# Patient Record
Sex: Female | Born: 1950
Health system: Southern US, Community
[De-identification: ages and names within clinical notes are randomized; demographics above are authoritative.]

## PROBLEM LIST (undated history)

## (undated) DIAGNOSIS — E669 Obesity, unspecified: Secondary | ICD-10-CM

## (undated) DIAGNOSIS — F1010 Alcohol abuse, uncomplicated: Secondary | ICD-10-CM

## (undated) DIAGNOSIS — D259 Leiomyoma of uterus, unspecified: Secondary | ICD-10-CM

## (undated) DIAGNOSIS — G894 Chronic pain syndrome: Secondary | ICD-10-CM

## (undated) DIAGNOSIS — E119 Type 2 diabetes mellitus without complications: Secondary | ICD-10-CM

## (undated) DIAGNOSIS — F410 Panic disorder [episodic paroxysmal anxiety] without agoraphobia: Secondary | ICD-10-CM

## (undated) DIAGNOSIS — I639 Cerebral infarction, unspecified: Secondary | ICD-10-CM

## (undated) DIAGNOSIS — H269 Unspecified cataract: Secondary | ICD-10-CM

## (undated) DIAGNOSIS — R195 Other fecal abnormalities: Secondary | ICD-10-CM

## (undated) DIAGNOSIS — IMO0002 Reserved for concepts with insufficient information to code with codable children: Secondary | ICD-10-CM

## (undated) DIAGNOSIS — I10 Essential (primary) hypertension: Secondary | ICD-10-CM

## (undated) DIAGNOSIS — E785 Hyperlipidemia, unspecified: Secondary | ICD-10-CM

## (undated) DIAGNOSIS — F10239 Alcohol dependence with withdrawal, unspecified: Secondary | ICD-10-CM

## (undated) DIAGNOSIS — J36 Peritonsillar abscess: Secondary | ICD-10-CM

## (undated) DIAGNOSIS — G4700 Insomnia, unspecified: Secondary | ICD-10-CM

## (undated) DIAGNOSIS — N493 Fournier gangrene: Secondary | ICD-10-CM

## (undated) DIAGNOSIS — F10939 Alcohol use, unspecified with withdrawal, unspecified: Secondary | ICD-10-CM

## (undated) HISTORY — DX: Peritonsillar abscess: J36

## (undated) HISTORY — DX: Obesity, unspecified: E66.9

## (undated) HISTORY — PX: MOUTH SURGERY: SHX715

## (undated) HISTORY — DX: Alcohol dependence with withdrawal, unspecified: F10.239

## (undated) HISTORY — DX: Type 2 diabetes mellitus without complications: E11.9

## (undated) HISTORY — DX: Leiomyoma of uterus, unspecified: D25.9

## (undated) HISTORY — DX: Cerebral infarction, unspecified: I63.9

## (undated) HISTORY — DX: Reserved for concepts with insufficient information to code with codable children: IMO0002

## (undated) HISTORY — DX: Panic disorder (episodic paroxysmal anxiety): F41.0

## (undated) HISTORY — DX: Other fecal abnormalities: R19.5

## (undated) HISTORY — DX: Fournier gangrene: N49.3

## (undated) HISTORY — DX: Alcohol use, unspecified with withdrawal, unspecified: F10.939

## (undated) HISTORY — DX: Chronic pain syndrome: G89.4

## (undated) HISTORY — DX: Insomnia, unspecified: G47.00

## (undated) HISTORY — DX: Unspecified cataract: H26.9

## (undated) HISTORY — DX: Alcohol abuse, uncomplicated: F10.10

## (undated) HISTORY — DX: Hyperlipidemia, unspecified: E78.5

## (undated) HISTORY — DX: Essential (primary) hypertension: I10

---

## 1993-02-12 DIAGNOSIS — E11319 Type 2 diabetes mellitus with unspecified diabetic retinopathy without macular edema: Secondary | ICD-10-CM | POA: Insufficient documentation

## 1994-02-12 DIAGNOSIS — R195 Other fecal abnormalities: Secondary | ICD-10-CM

## 1994-02-12 HISTORY — DX: Other fecal abnormalities: R19.5

## 1997-06-04 ENCOUNTER — Encounter: Admission: RE | Admit: 1997-06-04 | Discharge: 1997-06-04 | Payer: Self-pay | Admitting: Obstetrics & Gynecology

## 1997-06-04 ENCOUNTER — Other Ambulatory Visit: Admission: RE | Admit: 1997-06-04 | Discharge: 1997-06-04 | Payer: Self-pay | Admitting: Obstetrics & Gynecology

## 1997-07-23 ENCOUNTER — Encounter: Admission: RE | Admit: 1997-07-23 | Discharge: 1997-07-23 | Payer: Self-pay | Admitting: Internal Medicine

## 1997-09-10 ENCOUNTER — Encounter: Admission: RE | Admit: 1997-09-10 | Discharge: 1997-09-10 | Payer: Self-pay | Admitting: Internal Medicine

## 1997-09-28 ENCOUNTER — Encounter: Admission: RE | Admit: 1997-09-28 | Discharge: 1997-09-28 | Payer: Self-pay | Admitting: Internal Medicine

## 1997-10-11 ENCOUNTER — Encounter: Admission: RE | Admit: 1997-10-11 | Discharge: 1998-01-09 | Payer: Self-pay | Admitting: *Deleted

## 1998-03-18 ENCOUNTER — Encounter: Admission: RE | Admit: 1998-03-18 | Discharge: 1998-03-18 | Payer: Self-pay | Admitting: Internal Medicine

## 1998-04-18 ENCOUNTER — Encounter: Admission: RE | Admit: 1998-04-18 | Discharge: 1998-04-18 | Payer: Self-pay | Admitting: Internal Medicine

## 1998-05-12 ENCOUNTER — Encounter: Admission: RE | Admit: 1998-05-12 | Discharge: 1998-05-12 | Payer: Self-pay | Admitting: Internal Medicine

## 1998-10-25 ENCOUNTER — Encounter: Admission: RE | Admit: 1998-10-25 | Discharge: 1998-10-25 | Payer: Self-pay | Admitting: Internal Medicine

## 1999-03-14 ENCOUNTER — Encounter: Admission: RE | Admit: 1999-03-14 | Discharge: 1999-03-14 | Payer: Self-pay | Admitting: Internal Medicine

## 1999-03-20 ENCOUNTER — Ambulatory Visit (HOSPITAL_COMMUNITY): Admission: RE | Admit: 1999-03-20 | Discharge: 1999-03-20 | Payer: Self-pay | Admitting: *Deleted

## 1999-06-08 ENCOUNTER — Encounter: Admission: RE | Admit: 1999-06-08 | Discharge: 1999-06-08 | Payer: Self-pay | Admitting: Internal Medicine

## 1999-07-18 ENCOUNTER — Encounter: Admission: RE | Admit: 1999-07-18 | Discharge: 1999-07-18 | Payer: Self-pay | Admitting: Internal Medicine

## 2000-04-04 ENCOUNTER — Encounter: Admission: RE | Admit: 2000-04-04 | Discharge: 2000-04-04 | Payer: Self-pay | Admitting: Internal Medicine

## 2000-08-29 ENCOUNTER — Encounter: Admission: RE | Admit: 2000-08-29 | Discharge: 2000-08-29 | Payer: Self-pay | Admitting: Internal Medicine

## 2000-09-03 ENCOUNTER — Encounter: Admission: RE | Admit: 2000-09-03 | Discharge: 2000-09-03 | Payer: Self-pay | Admitting: Internal Medicine

## 2000-09-05 ENCOUNTER — Encounter: Admission: RE | Admit: 2000-09-05 | Discharge: 2000-09-05 | Payer: Self-pay | Admitting: Internal Medicine

## 2000-09-26 ENCOUNTER — Ambulatory Visit (HOSPITAL_COMMUNITY): Admission: RE | Admit: 2000-09-26 | Discharge: 2000-09-26 | Payer: Self-pay | Admitting: Internal Medicine

## 2000-10-03 ENCOUNTER — Encounter: Admission: RE | Admit: 2000-10-03 | Discharge: 2000-10-03 | Payer: Self-pay | Admitting: Internal Medicine

## 2001-05-08 ENCOUNTER — Encounter: Admission: RE | Admit: 2001-05-08 | Discharge: 2001-05-08 | Payer: Self-pay | Admitting: Internal Medicine

## 2001-05-22 ENCOUNTER — Encounter (HOSPITAL_BASED_OUTPATIENT_CLINIC_OR_DEPARTMENT_OTHER): Admission: RE | Admit: 2001-05-22 | Discharge: 2001-08-20 | Payer: Self-pay | Admitting: Internal Medicine

## 2001-08-27 ENCOUNTER — Encounter: Admission: RE | Admit: 2001-08-27 | Discharge: 2001-08-27 | Payer: Self-pay | Admitting: Internal Medicine

## 2001-09-09 ENCOUNTER — Encounter: Admission: RE | Admit: 2001-09-09 | Discharge: 2001-09-09 | Payer: Self-pay | Admitting: Internal Medicine

## 2001-09-29 ENCOUNTER — Encounter (HOSPITAL_BASED_OUTPATIENT_CLINIC_OR_DEPARTMENT_OTHER): Admission: RE | Admit: 2001-09-29 | Discharge: 2001-12-28 | Payer: Self-pay | Admitting: Internal Medicine

## 2001-10-30 ENCOUNTER — Encounter (INDEPENDENT_AMBULATORY_CARE_PROVIDER_SITE_OTHER): Payer: Self-pay | Admitting: Internal Medicine

## 2001-11-07 ENCOUNTER — Ambulatory Visit (HOSPITAL_COMMUNITY): Admission: RE | Admit: 2001-11-07 | Discharge: 2001-11-07 | Payer: Self-pay | Admitting: *Deleted

## 2002-01-12 ENCOUNTER — Encounter (HOSPITAL_BASED_OUTPATIENT_CLINIC_OR_DEPARTMENT_OTHER): Admission: RE | Admit: 2002-01-12 | Discharge: 2002-04-12 | Payer: Self-pay | Admitting: Internal Medicine

## 2002-03-19 ENCOUNTER — Encounter: Admission: RE | Admit: 2002-03-19 | Discharge: 2002-06-17 | Payer: Self-pay | Admitting: Internal Medicine

## 2002-06-29 ENCOUNTER — Encounter (HOSPITAL_BASED_OUTPATIENT_CLINIC_OR_DEPARTMENT_OTHER): Admission: RE | Admit: 2002-06-29 | Discharge: 2002-09-27 | Payer: Self-pay | Admitting: Internal Medicine

## 2002-07-17 ENCOUNTER — Encounter: Admission: RE | Admit: 2002-07-17 | Discharge: 2002-07-17 | Payer: Self-pay | Admitting: Internal Medicine

## 2002-07-24 ENCOUNTER — Encounter: Admission: RE | Admit: 2002-07-24 | Discharge: 2002-07-24 | Payer: Self-pay | Admitting: Internal Medicine

## 2002-07-29 ENCOUNTER — Encounter: Admission: RE | Admit: 2002-07-29 | Discharge: 2002-10-27 | Payer: Self-pay | Admitting: Internal Medicine

## 2002-08-19 ENCOUNTER — Encounter: Admission: RE | Admit: 2002-08-19 | Discharge: 2002-08-19 | Payer: Self-pay | Admitting: Internal Medicine

## 2002-09-07 ENCOUNTER — Encounter: Admission: RE | Admit: 2002-09-07 | Discharge: 2002-09-07 | Payer: Self-pay | Admitting: Internal Medicine

## 2002-10-26 ENCOUNTER — Encounter (HOSPITAL_BASED_OUTPATIENT_CLINIC_OR_DEPARTMENT_OTHER): Admission: RE | Admit: 2002-10-26 | Discharge: 2003-01-24 | Payer: Self-pay | Admitting: Internal Medicine

## 2002-10-26 ENCOUNTER — Encounter: Admission: RE | Admit: 2002-10-26 | Discharge: 2002-10-26 | Payer: Self-pay | Admitting: Infectious Diseases

## 2002-11-04 ENCOUNTER — Encounter: Payer: Self-pay | Admitting: Internal Medicine

## 2002-11-04 ENCOUNTER — Ambulatory Visit (HOSPITAL_COMMUNITY): Admission: RE | Admit: 2002-11-04 | Discharge: 2002-11-04 | Payer: Self-pay | Admitting: Internal Medicine

## 2002-11-25 ENCOUNTER — Ambulatory Visit (HOSPITAL_COMMUNITY): Admission: RE | Admit: 2002-11-25 | Discharge: 2002-11-25 | Payer: Self-pay | Admitting: *Deleted

## 2002-11-25 ENCOUNTER — Encounter (INDEPENDENT_AMBULATORY_CARE_PROVIDER_SITE_OTHER): Payer: Self-pay | Admitting: Internal Medicine

## 2002-11-26 ENCOUNTER — Encounter: Admission: RE | Admit: 2002-11-26 | Discharge: 2003-02-24 | Payer: Self-pay | Admitting: Internal Medicine

## 2002-12-08 ENCOUNTER — Encounter: Admission: RE | Admit: 2002-12-08 | Discharge: 2002-12-08 | Payer: Self-pay | Admitting: Internal Medicine

## 2003-03-01 ENCOUNTER — Encounter: Admission: RE | Admit: 2003-03-01 | Discharge: 2003-03-01 | Payer: Self-pay | Admitting: Internal Medicine

## 2003-03-25 ENCOUNTER — Encounter: Admission: RE | Admit: 2003-03-25 | Discharge: 2003-03-25 | Payer: Self-pay | Admitting: Internal Medicine

## 2003-03-27 ENCOUNTER — Ambulatory Visit (HOSPITAL_COMMUNITY): Admission: RE | Admit: 2003-03-27 | Discharge: 2003-03-27 | Payer: Self-pay | Admitting: Internal Medicine

## 2003-04-01 ENCOUNTER — Encounter: Admission: RE | Admit: 2003-04-01 | Discharge: 2003-04-01 | Payer: Self-pay | Admitting: Internal Medicine

## 2003-05-12 ENCOUNTER — Encounter (HOSPITAL_BASED_OUTPATIENT_CLINIC_OR_DEPARTMENT_OTHER): Admission: RE | Admit: 2003-05-12 | Discharge: 2003-05-25 | Payer: Self-pay | Admitting: Internal Medicine

## 2003-08-11 ENCOUNTER — Encounter (HOSPITAL_BASED_OUTPATIENT_CLINIC_OR_DEPARTMENT_OTHER): Admission: RE | Admit: 2003-08-11 | Discharge: 2003-11-09 | Payer: Self-pay | Admitting: Internal Medicine

## 2003-12-22 ENCOUNTER — Emergency Department (HOSPITAL_COMMUNITY): Admission: EM | Admit: 2003-12-22 | Discharge: 2003-12-22 | Payer: Self-pay | Admitting: Emergency Medicine

## 2003-12-24 ENCOUNTER — Emergency Department (HOSPITAL_COMMUNITY): Admission: EM | Admit: 2003-12-24 | Discharge: 2003-12-24 | Payer: Self-pay | Admitting: Emergency Medicine

## 2004-01-11 ENCOUNTER — Encounter (HOSPITAL_BASED_OUTPATIENT_CLINIC_OR_DEPARTMENT_OTHER): Admission: RE | Admit: 2004-01-11 | Discharge: 2004-04-10 | Payer: Self-pay | Admitting: Internal Medicine

## 2004-01-17 ENCOUNTER — Ambulatory Visit: Payer: Self-pay | Admitting: Internal Medicine

## 2004-07-14 ENCOUNTER — Ambulatory Visit: Payer: Self-pay | Admitting: Internal Medicine

## 2004-09-30 ENCOUNTER — Emergency Department (HOSPITAL_COMMUNITY): Admission: EM | Admit: 2004-09-30 | Discharge: 2004-09-30 | Payer: Self-pay | Admitting: Emergency Medicine

## 2005-02-27 ENCOUNTER — Ambulatory Visit: Payer: Self-pay | Admitting: Hospitalist

## 2005-03-06 ENCOUNTER — Ambulatory Visit: Payer: Self-pay | Admitting: Internal Medicine

## 2005-05-06 ENCOUNTER — Emergency Department (HOSPITAL_COMMUNITY): Admission: EM | Admit: 2005-05-06 | Discharge: 2005-05-06 | Payer: Self-pay | Admitting: Emergency Medicine

## 2005-08-02 ENCOUNTER — Ambulatory Visit: Payer: Self-pay | Admitting: Internal Medicine

## 2005-12-26 DIAGNOSIS — E785 Hyperlipidemia, unspecified: Secondary | ICD-10-CM

## 2005-12-26 DIAGNOSIS — I152 Hypertension secondary to endocrine disorders: Secondary | ICD-10-CM | POA: Insufficient documentation

## 2005-12-26 DIAGNOSIS — F1011 Alcohol abuse, in remission: Secondary | ICD-10-CM | POA: Insufficient documentation

## 2005-12-26 DIAGNOSIS — I10 Essential (primary) hypertension: Secondary | ICD-10-CM

## 2005-12-26 DIAGNOSIS — K2980 Duodenitis without bleeding: Secondary | ICD-10-CM | POA: Insufficient documentation

## 2005-12-26 DIAGNOSIS — E1169 Type 2 diabetes mellitus with other specified complication: Secondary | ICD-10-CM | POA: Insufficient documentation

## 2005-12-26 DIAGNOSIS — D259 Leiomyoma of uterus, unspecified: Secondary | ICD-10-CM | POA: Insufficient documentation

## 2005-12-26 DIAGNOSIS — D126 Benign neoplasm of colon, unspecified: Secondary | ICD-10-CM | POA: Insufficient documentation

## 2005-12-26 DIAGNOSIS — E1159 Type 2 diabetes mellitus with other circulatory complications: Secondary | ICD-10-CM | POA: Insufficient documentation

## 2005-12-26 DIAGNOSIS — F10231 Alcohol dependence with withdrawal delirium: Secondary | ICD-10-CM | POA: Insufficient documentation

## 2006-04-09 ENCOUNTER — Encounter (INDEPENDENT_AMBULATORY_CARE_PROVIDER_SITE_OTHER): Payer: Self-pay | Admitting: *Deleted

## 2006-04-09 ENCOUNTER — Telehealth (INDEPENDENT_AMBULATORY_CARE_PROVIDER_SITE_OTHER): Payer: Self-pay | Admitting: *Deleted

## 2006-04-09 ENCOUNTER — Ambulatory Visit: Payer: Self-pay | Admitting: Hospitalist

## 2006-04-09 DIAGNOSIS — H269 Unspecified cataract: Secondary | ICD-10-CM | POA: Insufficient documentation

## 2006-04-09 DIAGNOSIS — E1139 Type 2 diabetes mellitus with other diabetic ophthalmic complication: Secondary | ICD-10-CM | POA: Insufficient documentation

## 2006-04-09 LAB — CONVERTED CEMR LAB
CO2: 27 meq/L (ref 19–32)
Calcium: 9.6 mg/dL (ref 8.4–10.5)
Chloride: 93 meq/L — ABNORMAL LOW (ref 96–112)
Creatinine, Ser: 0.99 mg/dL (ref 0.40–1.20)
Glucose, Bld: 493 mg/dL — ABNORMAL HIGH (ref 70–99)
Potassium: 3.9 meq/L (ref 3.5–5.3)
Total CHOL/HDL Ratio: 5
Total Protein: 7.2 g/dL (ref 6.0–8.3)

## 2006-04-17 ENCOUNTER — Telehealth: Payer: Self-pay | Admitting: *Deleted

## 2006-06-24 ENCOUNTER — Emergency Department (HOSPITAL_COMMUNITY): Admission: EM | Admit: 2006-06-24 | Discharge: 2006-06-24 | Payer: Self-pay | Admitting: Emergency Medicine

## 2006-07-15 ENCOUNTER — Emergency Department (HOSPITAL_COMMUNITY): Admission: EM | Admit: 2006-07-15 | Discharge: 2006-07-15 | Payer: Self-pay | Admitting: Family Medicine

## 2006-10-14 HISTORY — PX: OTHER SURGICAL HISTORY: SHX169

## 2006-10-17 ENCOUNTER — Inpatient Hospital Stay (HOSPITAL_COMMUNITY): Admission: EM | Admit: 2006-10-17 | Discharge: 2006-10-27 | Payer: Self-pay | Admitting: Emergency Medicine

## 2006-10-17 ENCOUNTER — Encounter (INDEPENDENT_AMBULATORY_CARE_PROVIDER_SITE_OTHER): Payer: Self-pay | Admitting: General Surgery

## 2006-10-17 ENCOUNTER — Ambulatory Visit: Payer: Self-pay | Admitting: *Deleted

## 2006-10-19 ENCOUNTER — Encounter (INDEPENDENT_AMBULATORY_CARE_PROVIDER_SITE_OTHER): Payer: Self-pay | Admitting: *Deleted

## 2006-10-21 ENCOUNTER — Encounter (INDEPENDENT_AMBULATORY_CARE_PROVIDER_SITE_OTHER): Payer: Self-pay | Admitting: *Deleted

## 2006-10-21 ENCOUNTER — Ambulatory Visit: Payer: Self-pay | Admitting: Infectious Diseases

## 2006-11-14 ENCOUNTER — Telehealth (INDEPENDENT_AMBULATORY_CARE_PROVIDER_SITE_OTHER): Payer: Self-pay | Admitting: *Deleted

## 2006-11-18 ENCOUNTER — Encounter (INDEPENDENT_AMBULATORY_CARE_PROVIDER_SITE_OTHER): Payer: Self-pay | Admitting: *Deleted

## 2006-11-18 ENCOUNTER — Ambulatory Visit: Payer: Self-pay | Admitting: *Deleted

## 2006-11-18 DIAGNOSIS — M726 Necrotizing fasciitis: Secondary | ICD-10-CM | POA: Insufficient documentation

## 2006-11-18 LAB — CONVERTED CEMR LAB
ALT: 15 units/L (ref 0–35)
Alkaline Phosphatase: 93 units/L (ref 39–117)
BUN: 29 mg/dL — ABNORMAL HIGH (ref 6–23)
Glucose, Bld: 322 mg/dL — ABNORMAL HIGH (ref 70–99)
Total Bilirubin: 0.6 mg/dL (ref 0.3–1.2)
Total Protein: 9 g/dL — ABNORMAL HIGH (ref 6.0–8.3)

## 2006-11-21 ENCOUNTER — Ambulatory Visit: Payer: Self-pay | Admitting: *Deleted

## 2006-11-22 ENCOUNTER — Encounter (INDEPENDENT_AMBULATORY_CARE_PROVIDER_SITE_OTHER): Payer: Self-pay | Admitting: *Deleted

## 2006-11-27 ENCOUNTER — Ambulatory Visit: Payer: Self-pay | Admitting: *Deleted

## 2006-11-27 LAB — CONVERTED CEMR LAB
BUN: 19 mg/dL (ref 6–23)
Blood Glucose, Fingerstick: 458
Hgb A1c MFr Bld: 9.2 %
Sodium: 135 meq/L (ref 135–145)

## 2006-12-02 ENCOUNTER — Telehealth: Payer: Self-pay | Admitting: Infectious Diseases

## 2006-12-06 ENCOUNTER — Telehealth (INDEPENDENT_AMBULATORY_CARE_PROVIDER_SITE_OTHER): Payer: Self-pay | Admitting: *Deleted

## 2006-12-18 ENCOUNTER — Telehealth (INDEPENDENT_AMBULATORY_CARE_PROVIDER_SITE_OTHER): Payer: Self-pay | Admitting: *Deleted

## 2006-12-25 ENCOUNTER — Ambulatory Visit: Payer: Self-pay | Admitting: Internal Medicine

## 2006-12-25 LAB — CONVERTED CEMR LAB: Blood Glucose, Home Monitor: 2 mg/dL

## 2007-01-22 ENCOUNTER — Ambulatory Visit: Payer: Self-pay | Admitting: Internal Medicine

## 2007-01-22 ENCOUNTER — Encounter (INDEPENDENT_AMBULATORY_CARE_PROVIDER_SITE_OTHER): Payer: Self-pay | Admitting: *Deleted

## 2007-01-22 LAB — CONVERTED CEMR LAB: Blood Glucose, Fingerstick: 140

## 2007-01-23 LAB — CONVERTED CEMR LAB
CO2: 22 meq/L (ref 19–32)
Calcium: 9.3 mg/dL (ref 8.4–10.5)
Creatinine, Ser: 0.94 mg/dL (ref 0.40–1.20)
Glucose, Bld: 117 mg/dL — ABNORMAL HIGH (ref 70–99)
Potassium: 4.5 meq/L (ref 3.5–5.3)
Sodium: 141 meq/L (ref 135–145)

## 2007-02-18 ENCOUNTER — Telehealth (INDEPENDENT_AMBULATORY_CARE_PROVIDER_SITE_OTHER): Payer: Self-pay | Admitting: *Deleted

## 2007-02-19 ENCOUNTER — Telehealth: Payer: Self-pay | Admitting: Licensed Clinical Social Worker

## 2007-02-21 ENCOUNTER — Ambulatory Visit: Payer: Self-pay | Admitting: Internal Medicine

## 2007-02-21 ENCOUNTER — Encounter (INDEPENDENT_AMBULATORY_CARE_PROVIDER_SITE_OTHER): Payer: Self-pay | Admitting: *Deleted

## 2007-02-21 ENCOUNTER — Encounter: Payer: Self-pay | Admitting: Licensed Clinical Social Worker

## 2007-02-21 LAB — CONVERTED CEMR LAB
Calcium: 9.5 mg/dL (ref 8.4–10.5)
Glucose, Bld: 70 mg/dL (ref 70–99)
Potassium: 4.4 meq/L (ref 3.5–5.3)

## 2007-02-25 ENCOUNTER — Telehealth (INDEPENDENT_AMBULATORY_CARE_PROVIDER_SITE_OTHER): Payer: Self-pay | Admitting: *Deleted

## 2007-03-25 ENCOUNTER — Ambulatory Visit: Payer: Self-pay | Admitting: Internal Medicine

## 2007-03-25 LAB — CONVERTED CEMR LAB: Blood Glucose, Fingerstick: 65

## 2007-03-27 ENCOUNTER — Telehealth (INDEPENDENT_AMBULATORY_CARE_PROVIDER_SITE_OTHER): Payer: Self-pay | Admitting: *Deleted

## 2007-04-01 ENCOUNTER — Telehealth: Payer: Self-pay | Admitting: Licensed Clinical Social Worker

## 2007-04-02 ENCOUNTER — Telehealth: Payer: Self-pay | Admitting: *Deleted

## 2007-04-10 ENCOUNTER — Telehealth: Payer: Self-pay | Admitting: *Deleted

## 2007-04-24 ENCOUNTER — Ambulatory Visit: Payer: Self-pay | Admitting: *Deleted

## 2007-04-24 DIAGNOSIS — M549 Dorsalgia, unspecified: Secondary | ICD-10-CM | POA: Insufficient documentation

## 2007-06-24 ENCOUNTER — Ambulatory Visit: Payer: Self-pay | Admitting: Internal Medicine

## 2007-06-24 LAB — CONVERTED CEMR LAB: Blood Glucose, Fingerstick: 110

## 2007-07-11 ENCOUNTER — Encounter (INDEPENDENT_AMBULATORY_CARE_PROVIDER_SITE_OTHER): Payer: Self-pay | Admitting: *Deleted

## 2007-09-05 ENCOUNTER — Ambulatory Visit: Payer: Self-pay | Admitting: Internal Medicine

## 2007-09-05 ENCOUNTER — Ambulatory Visit (HOSPITAL_COMMUNITY): Admission: RE | Admit: 2007-09-05 | Discharge: 2007-09-05 | Payer: Self-pay | Admitting: Internal Medicine

## 2007-09-05 ENCOUNTER — Encounter: Payer: Self-pay | Admitting: Internal Medicine

## 2007-09-05 DIAGNOSIS — R0789 Other chest pain: Secondary | ICD-10-CM | POA: Insufficient documentation

## 2007-09-05 LAB — CONVERTED CEMR LAB
Blood Glucose, Fingerstick: 72
Hgb A1c MFr Bld: 6.5 %

## 2007-09-12 ENCOUNTER — Ambulatory Visit: Payer: Self-pay | Admitting: Internal Medicine

## 2007-09-12 ENCOUNTER — Encounter: Payer: Self-pay | Admitting: Internal Medicine

## 2007-09-15 LAB — CONVERTED CEMR LAB
Cholesterol: 210 mg/dL — ABNORMAL HIGH (ref 0–200)
LDL Cholesterol: 136 mg/dL — ABNORMAL HIGH (ref 0–99)
Total CHOL/HDL Ratio: 3.5
Triglycerides: 71 mg/dL (ref <150)
VLDL: 14 mg/dL (ref 0–40)

## 2007-09-17 ENCOUNTER — Encounter: Payer: Self-pay | Admitting: Internal Medicine

## 2007-09-18 ENCOUNTER — Encounter: Payer: Self-pay | Admitting: Internal Medicine

## 2007-10-08 ENCOUNTER — Ambulatory Visit: Payer: Self-pay | Admitting: *Deleted

## 2007-10-08 ENCOUNTER — Encounter: Payer: Self-pay | Admitting: Internal Medicine

## 2007-10-08 LAB — CONVERTED CEMR LAB
AST: 15 units/L (ref 0–37)
Albumin: 3.7 g/dL (ref 3.5–5.2)
Alkaline Phosphatase: 71 units/L (ref 39–117)
Blood Glucose, Fingerstick: 47
Calcium: 9.1 mg/dL (ref 8.4–10.5)

## 2007-10-30 ENCOUNTER — Ambulatory Visit: Payer: Self-pay | Admitting: Cardiology

## 2007-11-03 ENCOUNTER — Ambulatory Visit: Payer: Self-pay

## 2007-12-15 ENCOUNTER — Telehealth: Payer: Self-pay | Admitting: Internal Medicine

## 2007-12-28 ENCOUNTER — Telehealth: Payer: Self-pay | Admitting: Internal Medicine

## 2007-12-31 ENCOUNTER — Encounter: Payer: Self-pay | Admitting: Internal Medicine

## 2008-01-05 ENCOUNTER — Telehealth: Payer: Self-pay | Admitting: Internal Medicine

## 2008-01-13 ENCOUNTER — Encounter: Payer: Self-pay | Admitting: Licensed Clinical Social Worker

## 2008-01-19 ENCOUNTER — Ambulatory Visit: Payer: Self-pay | Admitting: Internal Medicine

## 2008-01-19 ENCOUNTER — Encounter: Payer: Self-pay | Admitting: Internal Medicine

## 2008-01-19 DIAGNOSIS — J019 Acute sinusitis, unspecified: Secondary | ICD-10-CM | POA: Insufficient documentation

## 2008-01-20 LAB — CONVERTED CEMR LAB
ALT: 16 units/L (ref 0–35)
AST: 14 units/L (ref 0–37)
Albumin: 4.4 g/dL (ref 3.5–5.2)
Alkaline Phosphatase: 83 units/L (ref 39–117)
Calcium: 9.2 mg/dL (ref 8.4–10.5)
Chloride: 107 meq/L (ref 96–112)
Creatinine, Ser: 1 mg/dL (ref 0.40–1.20)
Glucose, Bld: 145 mg/dL — ABNORMAL HIGH (ref 70–99)
Potassium: 4.1 meq/L (ref 3.5–5.3)

## 2008-02-03 ENCOUNTER — Ambulatory Visit: Payer: Self-pay | Admitting: Internal Medicine

## 2008-02-13 HISTORY — PX: COLONOSCOPY: SHX174

## 2008-02-19 ENCOUNTER — Ambulatory Visit: Payer: Self-pay | Admitting: Internal Medicine

## 2008-02-19 ENCOUNTER — Encounter (INDEPENDENT_AMBULATORY_CARE_PROVIDER_SITE_OTHER): Payer: Self-pay | Admitting: Internal Medicine

## 2008-02-19 LAB — CONVERTED CEMR LAB: Blood Glucose, Fingerstick: 123

## 2008-02-23 ENCOUNTER — Telehealth: Payer: Self-pay | Admitting: Internal Medicine

## 2008-02-24 ENCOUNTER — Telehealth (INDEPENDENT_AMBULATORY_CARE_PROVIDER_SITE_OTHER): Payer: Self-pay | Admitting: Internal Medicine

## 2008-02-24 LAB — CONVERTED CEMR LAB
BUN: 38 mg/dL — ABNORMAL HIGH (ref 6–23)
CO2: 15 meq/L — ABNORMAL LOW (ref 19–32)
Calcium: 9.4 mg/dL (ref 8.4–10.5)

## 2008-02-25 ENCOUNTER — Ambulatory Visit (HOSPITAL_COMMUNITY): Admission: RE | Admit: 2008-02-25 | Discharge: 2008-02-25 | Payer: Self-pay | Admitting: Internal Medicine

## 2008-02-25 ENCOUNTER — Telehealth: Payer: Self-pay | Admitting: Internal Medicine

## 2008-02-27 ENCOUNTER — Telehealth: Payer: Self-pay | Admitting: *Deleted

## 2008-02-28 ENCOUNTER — Telehealth (INDEPENDENT_AMBULATORY_CARE_PROVIDER_SITE_OTHER): Payer: Self-pay | Admitting: Internal Medicine

## 2008-03-01 ENCOUNTER — Encounter (INDEPENDENT_AMBULATORY_CARE_PROVIDER_SITE_OTHER): Payer: Self-pay | Admitting: Internal Medicine

## 2008-03-01 ENCOUNTER — Telehealth: Payer: Self-pay | Admitting: *Deleted

## 2008-03-01 ENCOUNTER — Ambulatory Visit: Payer: Self-pay | Admitting: Internal Medicine

## 2008-03-01 ENCOUNTER — Encounter: Payer: Self-pay | Admitting: Internal Medicine

## 2008-03-03 LAB — CONVERTED CEMR LAB
BUN: 21 mg/dL (ref 6–23)
CO2: 18 meq/L — ABNORMAL LOW (ref 19–32)
Calcium: 9.1 mg/dL (ref 8.4–10.5)
Chloride: 106 meq/L (ref 96–112)

## 2008-03-09 ENCOUNTER — Ambulatory Visit: Payer: Self-pay | Admitting: Infectious Disease

## 2008-03-09 ENCOUNTER — Encounter (INDEPENDENT_AMBULATORY_CARE_PROVIDER_SITE_OTHER): Payer: Self-pay | Admitting: Internal Medicine

## 2008-03-10 ENCOUNTER — Telehealth (INDEPENDENT_AMBULATORY_CARE_PROVIDER_SITE_OTHER): Payer: Self-pay | Admitting: Internal Medicine

## 2008-03-10 LAB — CONVERTED CEMR LAB
BUN: 33 mg/dL — ABNORMAL HIGH (ref 6–23)
Chloride: 107 meq/L (ref 96–112)
Osmolality: 291 mOsm/kg (ref 275–300)
Potassium: 4.5 meq/L (ref 3.5–5.3)
Sodium: 137 meq/L (ref 135–145)

## 2008-03-12 DIAGNOSIS — Z87448 Personal history of other diseases of urinary system: Secondary | ICD-10-CM | POA: Insufficient documentation

## 2008-03-17 ENCOUNTER — Ambulatory Visit: Payer: Self-pay | Admitting: Gastroenterology

## 2008-03-17 DIAGNOSIS — Z860100 Personal history of colon polyps, unspecified: Secondary | ICD-10-CM | POA: Insufficient documentation

## 2008-03-18 ENCOUNTER — Telehealth: Payer: Self-pay | Admitting: Gastroenterology

## 2008-03-19 ENCOUNTER — Telehealth: Payer: Self-pay | Admitting: Gastroenterology

## 2008-03-23 ENCOUNTER — Ambulatory Visit: Payer: Self-pay | Admitting: Gastroenterology

## 2008-04-08 ENCOUNTER — Encounter: Payer: Self-pay | Admitting: Internal Medicine

## 2008-04-28 ENCOUNTER — Encounter: Payer: Self-pay | Admitting: Internal Medicine

## 2008-05-13 ENCOUNTER — Ambulatory Visit: Payer: Self-pay | Admitting: Internal Medicine

## 2008-05-13 ENCOUNTER — Encounter: Payer: Self-pay | Admitting: Internal Medicine

## 2008-05-13 LAB — CONVERTED CEMR LAB
Hgb A1c MFr Bld: 8 %
LDL Cholesterol: 152 mg/dL — ABNORMAL HIGH (ref 0–99)
VLDL: 23 mg/dL (ref 0–40)

## 2008-05-18 ENCOUNTER — Encounter: Payer: Self-pay | Admitting: Internal Medicine

## 2008-05-18 ENCOUNTER — Ambulatory Visit: Payer: Self-pay | Admitting: Internal Medicine

## 2008-08-23 ENCOUNTER — Telehealth (INDEPENDENT_AMBULATORY_CARE_PROVIDER_SITE_OTHER): Payer: Self-pay | Admitting: *Deleted

## 2008-08-23 ENCOUNTER — Ambulatory Visit: Payer: Self-pay | Admitting: Internal Medicine

## 2008-08-23 LAB — CONVERTED CEMR LAB: Blood Glucose, Fingerstick: 173

## 2008-08-25 ENCOUNTER — Ambulatory Visit: Payer: Self-pay | Admitting: Internal Medicine

## 2008-08-25 ENCOUNTER — Encounter: Payer: Self-pay | Admitting: Internal Medicine

## 2008-08-30 ENCOUNTER — Encounter: Payer: Self-pay | Admitting: Internal Medicine

## 2008-09-16 LAB — CONVERTED CEMR LAB
Albumin: 4.1 g/dL (ref 3.5–5.2)
Basophils Absolute: 0 10*3/uL (ref 0.0–0.1)
CO2: 20 meq/L (ref 19–32)
Chloride: 107 meq/L (ref 96–112)
Cholesterol: 261 mg/dL — ABNORMAL HIGH (ref 0–200)
HDL: 65 mg/dL (ref >39)
Hemoglobin: 11.9 g/dL — ABNORMAL LOW (ref 12.0–15.0)
Lymphocytes Relative: 37 % (ref 12–46)
Lymphs Abs: 2.6 10*3/uL (ref 0.7–4.0)
MCV: 92.2 fL (ref 78.0–100.0)
Monocytes Absolute: 0.4 10*3/uL (ref 0.1–1.0)
Monocytes Relative: 6 % (ref 3–12)
Neutro Abs: 3.7 10*3/uL (ref 1.7–7.7)
Platelets: 224 10*3/uL (ref 150–400)
RBC: 4.11 M/uL (ref 3.87–5.11)
RDW: 14.4 % (ref 11.5–15.5)
Sodium: 139 meq/L (ref 135–145)
Total Bilirubin: 0.3 mg/dL (ref 0.3–1.2)
Total CHOL/HDL Ratio: 4
Triglycerides: 198 mg/dL — ABNORMAL HIGH (ref <150)
VLDL: 40 mg/dL (ref 0–40)
WBC: 7 10*3/uL (ref 4.0–10.5)

## 2008-11-11 ENCOUNTER — Ambulatory Visit: Payer: Self-pay | Admitting: Internal Medicine

## 2008-11-11 DIAGNOSIS — M79609 Pain in unspecified limb: Secondary | ICD-10-CM | POA: Insufficient documentation

## 2008-11-25 ENCOUNTER — Encounter: Payer: Self-pay | Admitting: Internal Medicine

## 2008-11-25 ENCOUNTER — Ambulatory Visit: Payer: Self-pay | Admitting: Internal Medicine

## 2008-11-25 ENCOUNTER — Telehealth: Payer: Self-pay | Admitting: *Deleted

## 2008-12-02 ENCOUNTER — Telehealth: Payer: Self-pay | Admitting: Internal Medicine

## 2008-12-17 ENCOUNTER — Telehealth: Payer: Self-pay | Admitting: Internal Medicine

## 2009-01-04 ENCOUNTER — Encounter: Payer: Self-pay | Admitting: Internal Medicine

## 2009-01-11 ENCOUNTER — Encounter: Payer: Self-pay | Admitting: Licensed Clinical Social Worker

## 2009-01-12 ENCOUNTER — Ambulatory Visit: Payer: Self-pay | Admitting: Infectious Disease

## 2009-01-12 DIAGNOSIS — J209 Acute bronchitis, unspecified: Secondary | ICD-10-CM | POA: Insufficient documentation

## 2009-01-12 LAB — CONVERTED CEMR LAB
Blood Glucose, Fingerstick: 68
Hgb A1c MFr Bld: 7.6 %

## 2009-03-02 ENCOUNTER — Ambulatory Visit (HOSPITAL_COMMUNITY): Admission: RE | Admit: 2009-03-02 | Discharge: 2009-03-02 | Payer: Self-pay | Admitting: Internal Medicine

## 2009-03-11 ENCOUNTER — Telehealth: Payer: Self-pay | Admitting: Internal Medicine

## 2009-04-28 ENCOUNTER — Ambulatory Visit: Payer: Self-pay | Admitting: Internal Medicine

## 2009-04-28 DIAGNOSIS — R197 Diarrhea, unspecified: Secondary | ICD-10-CM | POA: Insufficient documentation

## 2009-04-28 DIAGNOSIS — G4700 Insomnia, unspecified: Secondary | ICD-10-CM | POA: Insufficient documentation

## 2009-04-29 LAB — CONVERTED CEMR LAB
ALT: 12 units/L (ref 0–35)
AST: 10 units/L (ref 0–37)
Albumin: 4.2 g/dL (ref 3.5–5.2)
Alkaline Phosphatase: 69 units/L (ref 39–117)
Cholesterol: 242 mg/dL — ABNORMAL HIGH (ref 0–200)
LDL Cholesterol: 149 mg/dL — ABNORMAL HIGH (ref 0–99)
Potassium: 4.2 meq/L (ref 3.5–5.3)
Total CHOL/HDL Ratio: 3.4
Triglycerides: 112 mg/dL (ref <150)

## 2009-05-30 ENCOUNTER — Ambulatory Visit: Payer: Self-pay | Admitting: Internal Medicine

## 2009-05-31 ENCOUNTER — Telehealth: Payer: Self-pay | Admitting: Internal Medicine

## 2009-06-07 ENCOUNTER — Encounter: Payer: Self-pay | Admitting: Internal Medicine

## 2009-06-08 ENCOUNTER — Telehealth: Payer: Self-pay | Admitting: Licensed Clinical Social Worker

## 2009-06-09 ENCOUNTER — Encounter: Payer: Self-pay | Admitting: Internal Medicine

## 2009-06-17 ENCOUNTER — Telehealth: Payer: Self-pay | Admitting: Internal Medicine

## 2009-06-29 ENCOUNTER — Ambulatory Visit: Payer: Self-pay | Admitting: Infectious Disease

## 2009-06-29 LAB — CONVERTED CEMR LAB: Pap Smear: NEGATIVE

## 2009-06-29 LAB — HM PAP SMEAR: HM Pap smear: NEGATIVE

## 2009-07-04 ENCOUNTER — Ambulatory Visit: Payer: Self-pay | Admitting: Internal Medicine

## 2009-07-12 ENCOUNTER — Telehealth: Payer: Self-pay | Admitting: Internal Medicine

## 2009-07-26 ENCOUNTER — Telehealth (INDEPENDENT_AMBULATORY_CARE_PROVIDER_SITE_OTHER): Payer: Self-pay | Admitting: *Deleted

## 2009-08-17 ENCOUNTER — Telehealth: Payer: Self-pay | Admitting: Licensed Clinical Social Worker

## 2009-08-23 ENCOUNTER — Telehealth (INDEPENDENT_AMBULATORY_CARE_PROVIDER_SITE_OTHER): Payer: Self-pay | Admitting: *Deleted

## 2009-08-23 ENCOUNTER — Encounter: Payer: Self-pay | Admitting: Internal Medicine

## 2009-09-26 ENCOUNTER — Ambulatory Visit: Payer: Self-pay | Admitting: Internal Medicine

## 2009-09-26 DIAGNOSIS — G894 Chronic pain syndrome: Secondary | ICD-10-CM | POA: Insufficient documentation

## 2009-09-26 LAB — CONVERTED CEMR LAB: Blood Glucose, Fingerstick: 134

## 2009-09-28 ENCOUNTER — Telehealth: Payer: Self-pay | Admitting: *Deleted

## 2009-09-28 DIAGNOSIS — J309 Allergic rhinitis, unspecified: Secondary | ICD-10-CM | POA: Insufficient documentation

## 2009-10-05 ENCOUNTER — Telehealth: Payer: Self-pay | Admitting: *Deleted

## 2009-10-11 ENCOUNTER — Telehealth: Payer: Self-pay | Admitting: Internal Medicine

## 2009-10-12 ENCOUNTER — Encounter: Payer: Self-pay | Admitting: Internal Medicine

## 2009-10-25 ENCOUNTER — Encounter: Payer: Self-pay | Admitting: Internal Medicine

## 2009-10-25 ENCOUNTER — Telehealth: Payer: Self-pay | Admitting: *Deleted

## 2009-11-21 ENCOUNTER — Telehealth (INDEPENDENT_AMBULATORY_CARE_PROVIDER_SITE_OTHER): Payer: Self-pay | Admitting: *Deleted

## 2009-11-21 ENCOUNTER — Encounter: Payer: Self-pay | Admitting: Internal Medicine

## 2009-12-14 ENCOUNTER — Telehealth: Payer: Self-pay | Admitting: Internal Medicine

## 2009-12-19 ENCOUNTER — Encounter: Payer: Self-pay | Admitting: Licensed Clinical Social Worker

## 2009-12-19 ENCOUNTER — Encounter: Payer: Self-pay | Admitting: Internal Medicine

## 2009-12-22 ENCOUNTER — Encounter: Payer: Self-pay | Admitting: Internal Medicine

## 2009-12-22 ENCOUNTER — Telehealth: Payer: Self-pay | Admitting: Internal Medicine

## 2009-12-22 ENCOUNTER — Telehealth (INDEPENDENT_AMBULATORY_CARE_PROVIDER_SITE_OTHER): Payer: Self-pay | Admitting: *Deleted

## 2010-01-23 ENCOUNTER — Telehealth (INDEPENDENT_AMBULATORY_CARE_PROVIDER_SITE_OTHER): Payer: Self-pay | Admitting: *Deleted

## 2010-01-23 ENCOUNTER — Encounter: Payer: Self-pay | Admitting: Internal Medicine

## 2010-01-31 ENCOUNTER — Telehealth: Payer: Self-pay | Admitting: Licensed Clinical Social Worker

## 2010-02-02 ENCOUNTER — Ambulatory Visit: Payer: Self-pay | Admitting: Internal Medicine

## 2010-02-02 ENCOUNTER — Encounter: Payer: Self-pay | Admitting: Internal Medicine

## 2010-02-02 DIAGNOSIS — R5381 Other malaise: Secondary | ICD-10-CM | POA: Insufficient documentation

## 2010-02-02 DIAGNOSIS — R5383 Other fatigue: Secondary | ICD-10-CM

## 2010-02-02 LAB — CONVERTED CEMR LAB
ALT: 12 units/L (ref 0–35)
Albumin: 4.2 g/dL (ref 3.5–5.2)
CO2: 22 meq/L (ref 19–32)
Calcium: 9.4 mg/dL (ref 8.4–10.5)
Chloride: 104 meq/L (ref 96–112)
Potassium: 4.8 meq/L (ref 3.5–5.3)
RBC: 4.07 M/uL (ref 3.87–5.11)
Sodium: 140 meq/L (ref 135–145)
TSH: 1.939 microintl units/mL (ref 0.350–4.50)
Total CHOL/HDL Ratio: 3.8
Total CK: 74 units/L (ref 7–177)
Total Protein: 7.5 g/dL (ref 6.0–8.3)
VLDL: 22 mg/dL (ref 0–40)
WBC: 6.5 10*3/uL (ref 4.0–10.5)

## 2010-02-13 ENCOUNTER — Telehealth: Payer: Self-pay | Admitting: Internal Medicine

## 2010-02-15 ENCOUNTER — Telehealth: Payer: Self-pay | Admitting: Internal Medicine

## 2010-02-20 ENCOUNTER — Telehealth: Payer: Self-pay | Admitting: *Deleted

## 2010-02-20 ENCOUNTER — Encounter: Payer: Self-pay | Admitting: Internal Medicine

## 2010-02-23 ENCOUNTER — Telehealth: Payer: Self-pay | Admitting: Internal Medicine

## 2010-03-01 ENCOUNTER — Encounter
Admission: RE | Admit: 2010-03-01 | Discharge: 2010-03-14 | Payer: Self-pay | Source: Home / Self Care | Attending: Internal Medicine | Admitting: Internal Medicine

## 2010-03-03 ENCOUNTER — Ambulatory Visit (HOSPITAL_COMMUNITY): Admission: RE | Admit: 2010-03-03 | Payer: Self-pay | Source: Home / Self Care | Admitting: Family Medicine

## 2010-03-07 ENCOUNTER — Ambulatory Visit: Admission: RE | Admit: 2010-03-07 | Discharge: 2010-03-07 | Payer: Self-pay | Source: Home / Self Care

## 2010-03-07 ENCOUNTER — Encounter: Payer: Self-pay | Admitting: Licensed Clinical Social Worker

## 2010-03-07 DIAGNOSIS — F4321 Adjustment disorder with depressed mood: Secondary | ICD-10-CM | POA: Insufficient documentation

## 2010-03-09 ENCOUNTER — Telehealth: Payer: Self-pay | Admitting: Licensed Clinical Social Worker

## 2010-03-14 NOTE — Progress Notes (Signed)
Summary: metformin/ hla  Phone Note Call from Patient   Summary of Call: pt called to say she was taking her metformin the way dr Shon Baton had ask and would call back next week to let dr Shon Baton know how they were doing, i ask if there were any problems and she said no, i just need to let my body adjust Initial call taken by: Freddy Finner RN,  September 28, 2009 8:46 AM
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
## 2010-03-14 NOTE — Progress Notes (Signed)
Summary: refill/ hla  Phone Note Refill Request Message from:  Patient on October 25, 2009 10:20 AM  Refills Requested: Medication #1:  PERCOCET 5-325 MG  TABS Take 1 tablet by mouth every 6 hours as needed for pain   Dosage confirmed as above?Dosage Confirmed   Last Refilled: 8/15 pt would like to pick up script thurs am, she would like to pick up between 9am and 10am. she is having laser eye surg friday and will not be able to get out for awhile. last visit 09/26/2009  Initial call taken by: Freddy Finner RN,  October 25, 2009 10:22 AM  Follow-up for Phone Call        Rx printed and signed - nurse to complete. Follow-up by: Bertha Stakes MD,  October 25, 2009 11:16 AM  Additional Follow-up for Phone Call Additional follow up Details #1::        Rx given to patient Additional Follow-up by: Sander Nephew RN,  October 27, 2009 9:44 AM     Prescriptions: PERCOCET 5-325 MG  TABS (OXYCODONE-ACETAMINOPHEN) Take 1 tablet by mouth every 6 hours as needed for pain  #90 x 0   Entered and Authorized by:   Bertha Stakes MD   Signed by:   Bertha Stakes MD on 10/25/2009   Method used:   Print then Give to Patient   RxID:   [id number]

## 2010-03-14 NOTE — Progress Notes (Signed)
Summary: phone/gg  Phone Note Call from Patient   Caller: Patient Summary of Call: Pt called and asking for a referral to podiatrist,  "[facility]"  on [address].  Their fax  [phone]   Patient wants to be  seen  for toe nail clipping.   Hx diabeties She has seen another podiatrist in past but they are too far away.   Will you do the referral? Initial call taken by: Gevena Cotton RN,  March 11, 2009 11:35 AM  Follow-up for Phone Call        Yes I will place the order for referral. Thanks.   New Problems: INGROWING NAIL (ICD-703.0)   New Problems: INGROWING NAIL (ICD-703.0)  Appended Document: phone/gg referral faxed to [facility]

## 2010-03-14 NOTE — Progress Notes (Signed)
Summary: refill/ hla  Phone Note Refill Request Message from:  Patient on December 22, 2009 5:28 PM  Refills Requested: Medication #1:  PERCOCET 5-325 MG  TABS Take 1 tablet by mouth every 6 hours as needed for pain   Dosage confirmed as above?Dosage Confirmed   Supply Requested: 1 month   Last Refilled: 10/10 Initial call taken by: Freddy Finner RN,  December 22, 2009 5:28 PM  Follow-up for Phone Call        Chelsea [name] has a chronic pain syndrome that is treated with percocet #90 per month.  Last seen in clinic in August by Dr. Shon Baton with the plan to continue.  Will write for #90 X 1 month. Follow-up by: Oval Linsey MD,  December 22, 2009 5:39 PM    Prescriptions: PERCOCET 5-325 MG  TABS (OXYCODONE-ACETAMINOPHEN) Take 1 tablet by mouth every 6 hours as needed for pain  #90 x 0   Entered and Authorized by:   Oval Linsey MD   Signed by:   Oval Linsey MD on 12/22/2009   Method used:   Printed then faxed to ...       [facility].* (retail)       [phone]       [place], [place]  [address]       Ph: [phone]       Fax: [phone]   RxID:   [id number]
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
## 2010-03-14 NOTE — Progress Notes (Signed)
Summary: Soc. Work  Chartered loss adjuster: Patient Summary of Call: Patient called and said [facility] never recd physician letter.  Refaxed to Everitt Amber at [phone] per patient request.   Follow-up for Phone Call        Mailed copy of [facility] letter to patient.  Katy Fitch  August 24, 2009 10:16 AM
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
## 2010-03-14 NOTE — Progress Notes (Signed)
Summary: Prescription  Phone Note Call from Patient   Caller: Patient Call For: Chelsea Hummer MD Summary of Call: Call from pt said that she is having problems from her bowels.   Has Hemmorrhoids.  Is allergic to the Preparation H.  Allergic to the antiiseptic in the ointment.  Uses [facility] on [address].  Raw Bottom. Initial call taken by: Sander Nephew RN,  Jul 12, 2009 4:59 PM  Follow-up for Phone Call        These are OTC.  Please call her pharmacist and ask for a preparation with different ingredients. Follow-up by: Milta Deiters MD,  July 13, 2009 8:54 AM  Additional Follow-up for Phone Call Additional follow up Details #1::        RTC to pt given re- instructions to go to her pharmacy and ask the pharmacy to recommend another medicine for her hemorrhoids OTC that does not have the ingredients that she is sensitive to. Additional Follow-up by: Sander Nephew RN,  July 13, 2009 2:37 PM    Additional Follow-up for Phone Call Additional follow up Details #2::    Are her hemorrhoids acting up bc of on going diarrhea?  Is that why she needs oitment?  Note 3/17 - had been having D x 3 weeks.  RTC 5/18 and noted to still have D and stool studies ordered (were negative).  So total 5-6 weeks diarrhea, if she still has D.  Colonoscopy 1996 b/c of heme + stool.  Has no HgB anywhere in centricty nor EChart.  No TSH either that I can find.    If she is still having D, she needs to make an appt for reevaluation and CBC, TSH, and referral back to GI for F?U colonoscopy (10 yrs since prior study).    I called her to ask and phone just rang and rang - no answer nor answering machine. Follow-up by: Larey Dresser MD,  July 14, 2009 11:37 AM  Additional Follow-up for Phone Call Additional follow up Details #3:: Details for Additional Follow-up Action Taken: Agree with Dr. Lynnae January that she needs an appointment again- in the meantime until she can be seen she can treat her raw bottom with Destin  or Balmex OTC-just a thought.  Additional Follow-up by: Rhea Pink  DO,  July 14, 2009 2:15 PM

## 2010-03-14 NOTE — Assessment & Plan Note (Signed)
  45 minutes.  Review of chart, completion of FL-2, sign off Dr. Hilma Favors, call to [facility] to let them know ready.  Lanette advised me to mail in envelope.

## 2010-03-14 NOTE — Medication Information (Signed)
Summary: PERCOCET  PERCOCET   Imported By: Garlan Fillers 10/27/2009 10:45:02  _____________________________________________________________________  External Attachment:    Type:   Image     Comment:   External Document

## 2010-03-14 NOTE — Medication Information (Signed)
Summary: RX Folder  RX Folder   Imported By: Enedina Finner 09/02/2009 13:53:16  _____________________________________________________________________  External Attachment:    Type:   Image     Comment:   External Document

## 2010-03-14 NOTE — Progress Notes (Signed)
Summary: refill/gg     Phone Note Refill Request  on July 26, 2009 12:27 PM  Refills Requested: Medication #1:  PERCOCET 5-325 MG  TABS Take 1 tablet by mouth every 6 hours as needed for pain   Last Refilled: 06/29/2009 call when ready @ [phone]   Method Requested: Telephone to Pharmacy Initial call taken by: Gevena Cotton RN,  July 26, 2009 12:27 PM  Additional Follow-up for Phone Call Additional follow up Details #1::        Pt informed Rx is ready Additional Follow-up by: Gevena Cotton RN,  July 29, 2009 2:35 PM    Prescriptions: PERCOCET 5-325 MG  TABS (OXYCODONE-ACETAMINOPHEN) Take 1 tablet by mouth every 6 hours as needed for pain  #90 x 0   Entered and Authorized by:   Burman Freestone MD   Signed by:   Burman Freestone MD on 07/29/2009   Method used:   Print then Give to Patient   RxID:   [id number] PERCOCET 5-325 MG  TABS (OXYCODONE-ACETAMINOPHEN) Take 1 tablet by mouth every 6 hours as needed for pain  #90 x 0   Entered by:   Burman Freestone MD   Authorized by:   [name] Kitchen [facility] ATTENDING DESKTOP   Signed by:   Burman Freestone MD on 07/29/2009   Method used:   Print then Give to Patient   RxID:   [id number]

## 2010-03-14 NOTE — Progress Notes (Signed)
Summary: med refill/gp  Phone Note Refill Request Message from:  Patient on November 21, 2009 4:34 PM  Refills Requested: Medication #1:  PERCOCET 5-325 MG  TABS Take 1 tablet by mouth every 6 hours as needed for pain Last written 10/25/09.  Last appt. 8/15.   Method Requested: Pick up at Office Initial call taken by: Morrison Old RN,  November 21, 2009 4:33 PM  Follow-up for Phone Call        Pain was addressed at Aug visit with Dr Shon Baton and appropriate F/U scheduled. No contract that I could find. Follow-up by: Larey Dresser MD,  November 21, 2009 4:45 PM  Additional Follow-up for Phone Call Additional follow up Details #1::        Pt. was called, Rx ready for pick up. Additional Follow-up by: Morrison Old RN,  November 21, 2009 4:53 PM    Prescriptions: PERCOCET 5-325 MG  TABS (OXYCODONE-ACETAMINOPHEN) Take 1 tablet by mouth every 6 hours as needed for pain  #90 x 0   Entered and Authorized by:   Larey Dresser MD   Signed by:   Larey Dresser MD on 11/21/2009   Method used:   Print then Give to Patient   RxID:   [id number]

## 2010-03-14 NOTE — Progress Notes (Signed)
Summary: med refill/gp  Phone Note Refill Request Message from:  Fax from Pharmacy on December 14, 2009 10:15 AM  Refills Requested: Medication #1:  ATENOLOL 100 MG TABS take 1 tablet by mouth daily.   Last Refilled: 11/10/2009 Pt. has an appt. scheduled Nov.10.   Method Requested: Electronic Initial call taken by: Morrison Old RN,  December 14, 2009 10:15 AM  Follow-up for Phone Call       Follow-up by: Larey Dresser MD,  December 14, 2009 10:43 AM    Prescriptions: ATENOLOL 100 MG TABS (ATENOLOL) take 1 tablet by mouth daily.  #30 x 5   Entered and Authorized by:   Larey Dresser MD   Signed by:   Larey Dresser MD on 12/14/2009   Method used:   Electronically to        [facility] [phone]* (retail)       [address]       [place], [place]  [address]       Ph: [phone]       Fax: [phone]   RxID:   [id number]

## 2010-03-14 NOTE — Progress Notes (Signed)
  Phone Note Outgoing Call   Call placed by: Chelsea [name] NT II,  October 05, 2009 11:56 AM Call placed to: Patient Details for Reason: EYE APPT INFO Summary of Call: SPOKE WITH [name], [place] / FOR AUGUST 31, 011 AT 1:20PM / LETTER ALSO MAILED TO THE PATIENT.

## 2010-03-14 NOTE — Progress Notes (Signed)
Summary: med refill/gp  Phone Note Refill Request Message from:  Patient on Jun 17, 2009 11:54 AM  Refills Requested: Medication #1:  NOVOLOG MIX 70/30 FLEXPEN 70-30 %  SUSP Take 24 units before breakfast and 19  units before evening meal  Medication #2:  METFORMIN HCL 500 MG TABS take 2 tablet by mouth two times a day  Method Requested: Electronic Initial call taken by: Morrison Old RN,  Jun 17, 2009 11:55 AM    Prescriptions: NOVOLOG MIX 70/30 FLEXPEN 70-30 %  SUSP (INSULIN ASPART PROT & ASPART) Take 24 units before breakfast and 19  units before evening meal  #10 vial x 8   Entered and Authorized by:   Niel Hummer MD   Signed by:   Niel Hummer MD on 06/17/2009   Method used:   Electronically to        [facility] [phone]* (retail)       [place], [place]  [address]       Ph: [phone]       Fax: [phone]   [id number] METFORMIN HCL 500 MG TABS (METFORMIN HCL) take 2 tablet by mouth two times a day  #120 x 6   Entered and Authorized by:   Niel Hummer MD   Signed by:   Niel Hummer MD on 06/17/2009   Method used:   Electronically to        [facility] [phone]* (retail)       [address]       [place], [place]  [address]       Ph: [phone]       Fax: [phone]   [id number]

## 2010-03-14 NOTE — Progress Notes (Signed)
Summary: PREVENTIVE COLONOSCOPY  Phone Note Outgoing Call   Summary of Call: Pulled old paperchart and actually found some documentation in regards to patient having last colonoscopy performed by Dr. Deatra Ina on 01/01/1995.   All information given to the nurse. Initial call taken by: Enedina Finner,  December 22, 2009 9:01 AM

## 2010-03-14 NOTE — Medication Information (Signed)
Summary: PERCOCET  PERCOCET   Imported By: Garlan Fillers 12/30/2009 11:39:09  _____________________________________________________________________  External Attachment:    Type:   Image     Comment:   External Document
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
## 2010-03-14 NOTE — Miscellaneous (Signed)
Summary: [facility] FL2 [facility] FORM  [place] FL2 [facility] FORM   Imported By: Enedina Finner 01/03/2010 10:57:57  _____________________________________________________________________  External Attachment:    Type:   Image     Comment:   External Document

## 2010-03-14 NOTE — Progress Notes (Signed)
Summary: Soc. Work  Phone Note Other Incoming   Caller: Academic librarian of Call: [place] is needing letter stating she needs curb to curb service.   Jetaun's cell phone is [phone] to confirm details.     Appended Document: Soc. Work Civil engineer, contracting to [employer] verifying need for curb to [employer].

## 2010-03-14 NOTE — Medication Information (Signed)
Summary: PHYSICIAN PREFERRED PHARMACY  PHYSICIAN PREFERRED PHARMACY   Imported By: Garlan Fillers 06/09/2009 11:56:31  _____________________________________________________________________  External Attachment:    Type:   Image     Comment:   External Document

## 2010-03-14 NOTE — Letter (Signed)
Summary: [facility]/SCAT  [facility]   Imported By: Garlan Fillers 06/14/2009 13:54:34  _____________________________________________________________________  External Attachment:    Type:   Image     Comment:   External Document

## 2010-03-14 NOTE — Progress Notes (Signed)
Summary: Refill/gh  Phone Note Refill Request Message from:  Fax from Pharmacy on May 31, 2009 4:11 PM  Refills Requested: Medication #1:  VYTORIN 10-80 MG TABS take 1 tablet by mouth daily.   Last Refilled: 03/11/2009  Medication #2:  HYDROCHLOROTHIAZIDE 25 MG TABS take 1 tablet by mouth daily.  Medication #3:  ATENOLOL 100 MG TABS take 1 tablet by mouth daily.  Medication #4:  LISINOPRIL 20 MG TABS take 2  tablet by mouth daily.  Method Requested: Electronic Initial call taken by: Sander Nephew RN,  May 31, 2009 4:11 PM    Prescriptions: HYDROCHLOROTHIAZIDE 25 MG TABS (HYDROCHLOROTHIAZIDE) take 1 tablet by mouth daily.  #30 x 3   Entered and Authorized by:   Niel Hummer MD   Signed by:   Niel Hummer MD on 06/02/2009   Method used:   Electronically to        [facility] (retail)       [phone]       [place], [place]  [address]       Ph: [phone]       Fax: [phone]   RxID:   [id number] VYTORIN 10-80 MG TABS (EZETIMIBE-SIMVASTATIN) take 1 tablet by mouth daily.  #30 x 7   Entered and Authorized by:   Niel Hummer MD   Signed by:   Niel Hummer MD on 06/02/2009   Method used:   Electronically to        [facility] (retail)       [phone]       [place], [place]  [address]       Ph: [phone]       Fax: [phone]   RxID:   [id number] ATENOLOL 100 MG TABS (ATENOLOL) take 1 tablet by mouth daily.  #30 x 5   Entered and Authorized by:   Niel Hummer MD   Signed by:   Niel Hummer MD on 06/02/2009   Method used:   Electronically to        [facility] (retail)       [phone]       [place], [place]  [address]       Ph: [phone]       Fax: [phone]   RxID:   [id number] LISINOPRIL 20 MG TABS (LISINOPRIL) take 2  tablet by mouth daily.  #60 x 3   Entered and Authorized by:    Niel Hummer MD   Signed by:   Niel Hummer MD on 06/02/2009   Method used:   Electronically to        [facility].* (retail)       [phone]       [place], [place]  [address]       Ph: [phone]       Fax: [phone]   RxID:   [id number]

## 2010-03-14 NOTE — Progress Notes (Signed)
Summary: refill/gg  Phone Note Refill Request  on October 11, 2009 4:31 PM  Refills Requested: Medication #1:  HYDROCHLOROTHIAZIDE 25 MG TABS take 1 tablet by mouth daily.   Last Refilled: 09/09/2009 last visit 8/15,   last labs 3/17 with BUN elevated at 32   Method Requested: Electronic Initial call taken by: Gevena Cotton RN,  October 11, 2009 4:32 PM  Follow-up for Phone Call       Follow-up by: Larey Dresser MD,  October 11, 2009 4:38 PM    Prescriptions: HYDROCHLOROTHIAZIDE 25 MG TABS (HYDROCHLOROTHIAZIDE) take 1 tablet by mouth daily.  #30 x 3   Entered and Authorized by:   Larey Dresser MD   Signed by:   Larey Dresser MD on 10/11/2009   Method used:   Electronically to        [facility] [phone]* (retail)       [address]       [place], [place]  [address]       Ph: [phone]       Fax: [phone]   RxID:   [id number]

## 2010-03-14 NOTE — Consult Note (Signed)
Summary: [facility]  [facility]   Imported By: Lacy Duverney 10/25/2009 10:13:12  _____________________________________________________________________  External Attachment:    Type:   Image     Comment:   External Document  Appended Document: [facility] DM retinopathy with proliferative disease right eye - pt to be referred to retina.  Appended Document: [facility]   Diabetic Eye Exam  Procedure date:  10/12/2009  Findings:      Mild non-proliferative diabetic retinopathy.  OU Cataract.  OD    Diabetic Eye Exam  Procedure date:  10/12/2009  Findings:      Mild non-proliferative diabetic retinopathy.  OU Cataract.  OD

## 2010-03-14 NOTE — Progress Notes (Signed)
Summary: refill/ hla  Phone Note Refill Request Message from:  Patient on August 23, 2009 12:03 PM  Refills Requested: Medication #1:  PERCOCET 5-325 MG  TABS Take 1 tablet by mouth every 6 hours as needed for pain   Dosage confirmed as above?Dosage Confirmed   Last Refilled: 6/18 Initial call taken by: Freddy Finner RN,  August 23, 2009 12:03 PM  Follow-up for Phone Call        Refill approved-nurse to complete-script in my office-may fill on 7/17. Follow-up by: Rhea Pink  DO,  August 23, 2009 2:48 PM  Additional Follow-up for Phone Call Additional follow up Details #1::        Rx. ready for pick-up; pt. was called and instructed can not be refilled until 7/17. Additional Follow-up by: Morrison Old RN,  August 24, 2009 11:17 AM    Prescriptions: PERCOCET 5-325 MG  TABS (OXYCODONE-ACETAMINOPHEN) Take 1 tablet by mouth every 6 hours as needed for pain  #90 x 0   Entered and Authorized by:   Rhea Pink  DO   Signed by:   Rhea Pink  DO on 08/23/2009   Method used:   Print then Give to Patient   RxID:   [id number]

## 2010-03-15 ENCOUNTER — Encounter: Payer: Self-pay | Admitting: Family Medicine

## 2010-03-16 NOTE — Letter (Signed)
Summary: [facility]  [facility]   Imported By: Garlan Fillers 02/08/2010 11:51:34  _____________________________________________________________________  External Attachment:    Type:   Image     Comment:   External Document
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
## 2010-03-16 NOTE — Progress Notes (Signed)
Summary: refill/gg  Phone Note Refill Request  on February 15, 2010 3:18 PM  [facility]  needs new Rx for diabetic supplies as pt insurance will only cover accu - check.  They need a new Rx for Accu-chek aviva  meter, accuchek aviva plus strips, and  accuchek multi clix  lancets  also need Dx code on Rx   Method Requested: Electronic Initial call taken by: Gevena Cotton RN,  February 15, 2010 3:18 PM  Follow-up for Phone Call        [name] can you enter these.  Want to be sure they are correct.   Additional Follow-up for Phone Call Additional follow up Details #1::        New meter and supplies entered.   I suggest we be sure patient brings meter to appointments and it is downloaded to verifiy 3x a day testing (note history of not bringing meter and no download fo Pro[employer] meter for this patient in database)  and change frequency if needed as it is not  clear how often she actually self monitors. Additional Follow-up by: Barnabas Harries RD,CDE,  February 15, 2010 4:56 PM    Additional Follow-up for Phone Call Additional follow up Details #2::    Prescriptions were called to the [facility] on [address] per pt request. Follow-up by: Sander Nephew RN,  February 16, 2010 12:14 PM  New/Updated Medications: ACCU-CHEK AVIVA  STRP (GLUCOSE BLOOD) check blood sugar up to 3 times daily (250.00) ACCU-CHEK AVIVA  KIT (BLOOD GLUCOSE MONITORING SUPPL) use to check blood sugar up to 3x daily (250.00) ACCU-CHEK MULTICLIX LANCETS  MISC (LANCETS) use to check blood sugar up to 3x daily (250.00) Prescriptions: ACCU-CHEK MULTICLIX LANCETS  MISC (LANCETS) use to check blood sugar up to 3x daily (250.00)  #100 x 1   Entered by:   Sander Nephew RN   Authorized by:   Rikki Spearing, MD   Signed by:   Sander Nephew RN on 02/16/2010   Method used:   Electronically to        [facility] (retail)       [phone]       [place], [place]  [address]       Ph:  [phone]       Fax: [phone]   RxID:   [id number] ACCU-CHEK AVIVA  KIT (BLOOD GLUCOSE MONITORING SUPPL) use to check blood sugar up to 3x daily (250.00)  #1 x 0   Entered by:   Sander Nephew RN   Authorized by:   Rikki Spearing, MD   Signed by:   Sander Nephew RN on 02/16/2010   Method used:   Electronically to        [facility] (retail)       [phone]       [place], [place]  [address]       Ph: [phone]       Fax: [phone]   RxID:   [id number] ACCU-CHEK AVIVA  STRP (GLUCOSE BLOOD) check blood sugar up to 3 times daily (250.00)  #100 x 1   Entered by:   Sander Nephew RN   Authorized by:   Rikki Spearing, MD   Signed by:   Sander Nephew RN on 02/16/2010   Method used:   Electronically to        [facility] (retail)       [phone]       [place], [place]  [address]       Ph: [phone]       Fax: [phone]   RxID:   [id number]

## 2010-03-16 NOTE — Medication Information (Signed)
Summary: PERCOCET  PERCOCET   Imported By: Garlan Fillers 02/28/2010 10:42:38  _____________________________________________________________________  External Attachment:    Type:   Image     Comment:   External Document

## 2010-03-16 NOTE — Medication Information (Signed)
Summary: PERCOCET  PERCOCET   Imported By: Garlan Fillers 02/01/2010 12:02:37  _____________________________________________________________________  External Attachment:    Type:   Image     Comment:   External Document

## 2010-03-16 NOTE — Progress Notes (Signed)
Summary: refill/gg  Phone Note Refill Request  on January 23, 2010 10:36 AM  Refills Requested: Medication #1:  PERCOCET 5-325 MG  TABS Take 1 tablet by mouth every 6 hours as needed for pain   Last Refilled: 12/22/2009 Pt would like to get tue. Pt # [id number]    Last visit 09/26/09   pain addressed   Method Requested: Pick up at Office Initial call taken by: Gevena Cotton RN,  January 23, 2010 10:37 AM  Follow-up for Phone Call        Reviewed records, will provide refill for 1 month supply.  Patient should have pain contract signed on next visit as I do not see one on record.  No red flags found on quick overlook of records. Follow-up by: Alphia Moh MD,  January 23, 2010 1:49 PM  Additional Follow-up for Phone Call Additional follow up Details #1::        Pt informed Rx is ready Additional Follow-up by: Gevena Cotton RN,  January 23, 2010 2:29 PM    Prescriptions: PERCOCET 5-325 MG  TABS (OXYCODONE-ACETAMINOPHEN) Take 1 tablet by mouth every 6 hours as needed for pain  #90 x 0   Entered and Authorized by:   Alphia Moh MD   Signed by:   Alphia Moh MD on 01/23/2010   Method used:   Handwritten   [id number]:[id number]

## 2010-03-16 NOTE — Progress Notes (Signed)
Summary: phone/gg  Phone Note Call from Patient   Caller: Patient Summary of Call: Pt called and has appointment for yearly mammorgram at [facility]  on 1/20.  She needs a referral for this. Pt # [id number] Initial call taken by: Gevena Cotton RN,  February 23, 2010 9:25 AM  Follow-up for Phone Call        Order placed for mammogram. Follow-up by: Rikki Spearing, MD,  February 23, 2010 7:46 PM

## 2010-03-16 NOTE — Progress Notes (Signed)
Summary: med refill/gp  Phone Note Refill Request Message from:  Patient on February 20, 2010 5:02 PM  Refills Requested: Medication #1:  PERCOCET 5-325 MG  TABS Take 1 tablet by mouth every 6 hours as needed for pain Last appt.02/02/10.   Method Requested: Pick up at Office Initial call taken by: Morrison Old RN,  February 20, 2010 5:02 PM  Follow-up for Phone Call        Med is due tomorrow. Follow-up by: Larey Dresser MD,  February 20, 2010 5:23 PM  Additional Follow-up for Phone Call Additional follow up Details #1::        Rx ready to be pick up; pt. called. Additional Follow-up by: Morrison Old RN,  February 20, 2010 5:26 PM    Prescriptions: PERCOCET 5-325 MG  TABS (OXYCODONE-ACETAMINOPHEN) Take 1 tablet by mouth every 6 hours as needed for pain  #90 x 0   Entered and Authorized by:   Larey Dresser MD   Signed by:   Larey Dresser MD on 02/20/2010   Method used:   Print then Give to Patient   RxID:   [id number]
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
## 2010-03-16 NOTE — Miscellaneous (Signed)
Summary: Soc Work Consult  15 min.   Consult with physician about resources for grief counseling.  Patient has had sudden loss of her brother last week.  Reviewed patient's insurance.  She is Medicaid only.  Gave MD brochure to give to patient due to time constraints today.  SW to call or visit with patient for follow-up to ensure services.

## 2010-03-16 NOTE — Progress Notes (Signed)
Summary: Soc. Work  Surveyor, minerals of Call: Lanette [phone]) from [place] [facility] called and said that patient was not going to be approved for CAPS this year (did not meet ADL requirement) and that they would keep her on for 20 hours per week until PCS could be arranged.  I told Lanette that the patient needed an OV because it has been over 90 days and that is a requirement of inhome aide.  Lanette said she would call patient so that Ms. Reisch could schedule appmt.      Appended Document: Soc. Work [employer] form was faxed to [employer] on 02/02/10.

## 2010-03-16 NOTE — Progress Notes (Signed)
Summary: Refill/gh  Phone Note Refill Request Message from:  Fax from Pharmacy on February 13, 2010 5:03 PM  Refills Requested: Medication #1:  HYDROCHLOROTHIAZIDE 25 MG TABS take 1 tablet by mouth daily.   Last Refilled: 02/13/2010 Last labs and visit was 02/02/2010.   Method Requested: Electronic Initial call taken by: Sander Nephew RN,  February 13, 2010 5:03 PM    Prescriptions: IBUPROFEN 800 MG TABS (IBUPROFEN) Take 1 tablet by mouth three times a day as needed for pain  #90 x 2   Entered by:   Lysle Pearl MD   Authorized by:   Burman Freestone MD   Signed by:   Lysle Pearl MD on 02/15/2010   Method used:   Electronically to        [facility].* (retail)       [phone]       [place], [place]  [address]       Ph: [phone]       Fax: [phone]   RxID:   [id number] VENTOLIN HFA 108 (90 BASE) MCG/ACT AERS (ALBUTEROL SULFATE) Inhal every 6 hours as needed.  #1 x 12   Entered by:   Lysle Pearl MD   Authorized by:   Burman Freestone MD   Signed by:   Lysle Pearl MD on 02/15/2010   Method used:   Electronically to        [facility].* (retail)       [phone]       [place], [place]  [address]       Ph: [phone]       Fax: [phone]   RxID:   [id number] PRODIGY BLOOD GLUCOSE TEST  STRP (GLUCOSE BLOOD) use as directed.  #1 box x 12   Entered by:   Lysle Pearl MD   Authorized by:   Burman Freestone MD   Signed by:   Lysle Pearl MD on 02/15/2010   Method used:   Electronically to        [facility].* (retail)       [phone]       [place], [place]  [address]       Ph: [phone]       Fax: [phone]   RxID:   [id number] FLONASE 50 MCG/ACT SUSP (FLUTICASONE PROPIONATE) 2 sprays per nostril twice daily.  #1 x 12   Entered by:   Lysle Pearl MD   Authorized by:   Burman Freestone MD   Signed by:    Lysle Pearl MD on 02/15/2010   Method used:   Electronically to        [facility].* (retail)       [phone]       [place],   [address]       Ph: [phone]       Fax: [phone]   [id number] BD U/F SHORT PEN NEEDLE 31G X 8 MM MISC (INSULIN PEN NEEDLE) use two times a day or as directed  #60 x 12   Entered by:   Lysle Pearl MD   Authorized by:   Burman Freestone MD   Signed by:   Lysle Pearl MD on 02/15/2010  Method used:   Electronically to        [facility].* (retail)       [phone]       [place], [place]  [address]       Ph: [phone]       Fax: [phone]   RxID:   [id number] MG  TBEC (ASPIRIN) 1 tablet by mouth daily  #0 x 12   Entered by:   Lysle Pearl MD   Authorized by:   Burman Freestone MD   Signed by:   Lysle Pearl MD on 02/15/2010   Method used:   Electronically to        [facility].* (retail)       [phone]       [place], [place]  [address]       Ph: [phone]       Fax: [phone]   RxID:   [id number] HYDROCHLOROTHIAZIDE 25 MG TABS (HYDROCHLOROTHIAZIDE) take 1 tablet by mouth daily.  #30 x 12   Entered by:   Lysle Pearl MD   Authorized by:   Burman Freestone MD   Signed by:   Lysle Pearl MD on 02/15/2010   Method used:   Electronically to        [facility].* (retail)       [phone]       [place], [place]  [address]       Ph: [phone]       Fax: [phone]   RxID:   [id number] NOVOLOG MIX 70/30 FLEXPEN 70-30 %  SUSP (INSULIN ASPART PROT & ASPART) Take 24 units before breakfast and 19  units before evening meal  #10 vial x 8   Entered by:   Lysle Pearl MD   Authorized by:   Burman Freestone MD   Signed by:   Lysle Pearl MD on 02/15/2010   Method used:   Electronically to        [facility].* (retail)       [phone]       [place], [place]  [address]       Ph: [phone]       Fax: [phone]   [id number] LANCETS  MISC (LANCETS) test three times a day or as directed  #100 x 6   Entered by:   Lysle Pearl MD   Authorized by:   Burman Freestone MD   Signed by:   Lysle Pearl MD on 02/15/2010   Method used:   Electronically to        [facility].* (retail)       [phone]       [place], [place]  [address]       Ph: [phone]       Fax: [phone]   RxID:   [id number] METFORMIN HCL 500 MG TABS (METFORMIN HCL) take 1 tablet by mouth two times a day  #0 x 3   Entered by:   Lysle Pearl MD   Authorized by:   Burman Freestone MD   Signed by:   Lysle Pearl MD on 02/15/2010   Method used:  Electronically to        [facility].* (retail)       [phone]       [place], [place]  [address]       Ph: [phone]       Fax: [phone]   RxID:   [id number] ATENOLOL 100 MG TABS (ATENOLOL) take 1 tablet by mouth daily.  #30 x 3   Entered by:   Lysle Pearl MD   Authorized by:   Burman Freestone MD   Signed by:   Lysle Pearl MD on 02/15/2010   Method used:   Electronically to        [facility].* (retail)       [phone]       [place], [place]  [address]       Ph: [phone]       Fax: [phone]   RxID:   [id number] LISINOPRIL 20 MG TABS (LISINOPRIL) take 2  tablet by mouth daily.  #60 x 3   Entered by:   Lysle Pearl MD   Authorized by:   Burman Freestone MD   Signed by:   Lysle Pearl MD on 02/15/2010   Method used:   Electronically to        [facility].* (retail)       [phone]       [place], [place]  [address]       Ph: [phone]       Fax: [phone]   RxID:   [id number]

## 2010-03-17 NOTE — Medication Information (Signed)
Summary: PERCOCET  PERCOCET   Imported By: Garlan Fillers 11/24/2009 15:12:34  _____________________________________________________________________  External Attachment:    Type:   Image     Comment:   External Document

## 2010-03-22 NOTE — Progress Notes (Signed)
Summary: Soc. Work  [employer]ista [employer]: F/U phone call with Ms. Derstine to check in with her.   She is doing OK.  She has good family support and 7 brothers and sisters.  She was very close to her brother, Edd Arbour, who was 62 yo.   Ronnie happened to be staying with her and they found him dead in her home.   She is receptive to the Hospice information and in particular the support groups.  I am mailing info on the support groups today.

## 2010-03-23 ENCOUNTER — Other Ambulatory Visit: Payer: Self-pay | Admitting: Internal Medicine

## 2010-03-23 ENCOUNTER — Ambulatory Visit: Payer: Self-pay | Admitting: Physical Therapy

## 2010-03-23 DIAGNOSIS — Z1231 Encounter for screening mammogram for malignant neoplasm of breast: Secondary | ICD-10-CM

## 2010-03-23 DIAGNOSIS — Z139 Encounter for screening, unspecified: Secondary | ICD-10-CM

## 2010-03-24 ENCOUNTER — Other Ambulatory Visit: Payer: Self-pay | Admitting: *Deleted

## 2010-03-24 MED ORDER — OXYCODONE-ACETAMINOPHEN 5-325 MG PO TABS: 1.0000 | ORAL_TABLET | Freq: Four times a day (QID) | ORAL | Status: DC | PRN

## 2010-03-24 NOTE — Telephone Encounter (Signed)
Last seen 03/07/10 and 1-2 month return was requested. No appt in [facility]. Will ask front desk to make appt. Last refill 02/20/10 so refill appropriate.  On monthly narcs since 3/11 but no narc contract. Will ask PCP to obtain on next visit.

## 2010-04-04 ENCOUNTER — Encounter (HOSPITAL_COMMUNITY): Payer: Self-pay

## 2010-04-04 ENCOUNTER — Ambulatory Visit (HOSPITAL_COMMUNITY)
Admission: RE | Admit: 2010-04-04 | Discharge: 2010-04-04 | Disposition: A | Discharge status: Home / Self Care | Payer: Medicaid Other | Source: Ambulatory Visit | Attending: Family Medicine | Admitting: Family Medicine

## 2010-04-04 DIAGNOSIS — Z1231 Encounter for screening mammogram for malignant neoplasm of breast: Secondary | ICD-10-CM

## 2010-04-04 LAB — HM MAMMOGRAPHY

## 2010-04-24 ENCOUNTER — Other Ambulatory Visit: Payer: Self-pay | Admitting: *Deleted

## 2010-04-25 MED ORDER — OXYCODONE-ACETAMINOPHEN 5-325 MG PO TABS: 1.0000 | ORAL_TABLET | Freq: Four times a day (QID) | ORAL | Status: DC | PRN

## 2010-04-26 ENCOUNTER — Ambulatory Visit: Payer: Medicaid Other | Attending: Internal Medicine | Admitting: Rehabilitative and Restorative Service Providers"

## 2010-04-26 DIAGNOSIS — M6281 Muscle weakness (generalized): Secondary | ICD-10-CM | POA: Insufficient documentation

## 2010-04-26 DIAGNOSIS — IMO0001 Reserved for inherently not codable concepts without codable children: Secondary | ICD-10-CM | POA: Insufficient documentation

## 2010-04-26 DIAGNOSIS — R5381 Other malaise: Secondary | ICD-10-CM | POA: Insufficient documentation

## 2010-04-26 DIAGNOSIS — R269 Unspecified abnormalities of gait and mobility: Secondary | ICD-10-CM | POA: Insufficient documentation

## 2010-04-26 DIAGNOSIS — R293 Abnormal posture: Secondary | ICD-10-CM | POA: Insufficient documentation

## 2010-04-27 ENCOUNTER — Ambulatory Visit: Payer: Self-pay | Admitting: Physical Therapy

## 2010-04-27 LAB — GLUCOSE, CAPILLARY: Glucose-Capillary: 134 mg/dL — ABNORMAL HIGH (ref 70–99)

## 2010-05-01 ENCOUNTER — Encounter: Payer: Medicaid Other | Admitting: Physical Therapy

## 2010-05-03 ENCOUNTER — Encounter: Payer: Medicaid Other | Admitting: Physical Therapy

## 2010-05-04 ENCOUNTER — Other Ambulatory Visit: Payer: Self-pay | Admitting: *Deleted

## 2010-05-04 MED ORDER — LISINOPRIL 20 MG PO TABS: 40.0000 mg | ORAL_TABLET | Freq: Every day | ORAL | Status: DC

## 2010-05-04 NOTE — Telephone Encounter (Signed)
Last labs 02/02/10

## 2010-05-16 LAB — GLUCOSE, CAPILLARY: Glucose-Capillary: 68 mg/dL — ABNORMAL LOW (ref 70–99)

## 2010-05-24 ENCOUNTER — Other Ambulatory Visit: Payer: Self-pay | Admitting: *Deleted

## 2010-05-24 LAB — GLUCOSE, CAPILLARY: Glucose-Capillary: 149 mg/dL — ABNORMAL HIGH (ref 70–99)

## 2010-05-24 NOTE — Telephone Encounter (Signed)
Call [phone] when ready.

## 2010-05-25 MED ORDER — OXYCODONE-ACETAMINOPHEN 5-325 MG PO TABS: 1.0000 | ORAL_TABLET | Freq: Four times a day (QID) | ORAL | Status: DC | PRN

## 2010-05-25 NOTE — Telephone Encounter (Signed)
Chelsea [name] has missed several of her PT appointments and has no appointment scheduled with the [facility] in our system.  I would rather not continue to prescribe opiates to her as I suspect her knee pain may benefit from different therapy (including PT).  I will refill this prescription but she needs an appointment with [place].

## 2010-05-30 LAB — GLUCOSE, CAPILLARY
Glucose-Capillary: 243 mg/dL — ABNORMAL HIGH (ref 70–99)
Glucose-Capillary: 247 mg/dL — ABNORMAL HIGH (ref 70–99)

## 2010-06-08 ENCOUNTER — Encounter: Payer: Self-pay | Admitting: Ophthalmology

## 2010-06-13 ENCOUNTER — Encounter: Payer: Medicaid Other | Admitting: Internal Medicine

## 2010-06-13 ENCOUNTER — Encounter: Payer: Self-pay | Admitting: Internal Medicine

## 2010-06-13 ENCOUNTER — Ambulatory Visit (INDEPENDENT_AMBULATORY_CARE_PROVIDER_SITE_OTHER): Payer: Medicaid Other | Admitting: Internal Medicine

## 2010-06-13 VITALS — BP 149/62 | HR 61 | Temp 97.3°F | Ht 65.0 in | Wt 233.1 lb

## 2010-06-13 DIAGNOSIS — E119 Type 2 diabetes mellitus without complications: Secondary | ICD-10-CM

## 2010-06-13 DIAGNOSIS — M25569 Pain in unspecified knee: Secondary | ICD-10-CM

## 2010-06-13 DIAGNOSIS — M25561 Pain in right knee: Secondary | ICD-10-CM

## 2010-06-13 MED ORDER — OXYCODONE-ACETAMINOPHEN 5-325 MG PO TABS: 1.0000 | ORAL_TABLET | Freq: Four times a day (QID) | ORAL | Status: DC | PRN

## 2010-06-13 MED ORDER — METFORMIN HCL 1000 MG PO TABS: 1000.0000 mg | ORAL_TABLET | Freq: Two times a day (BID) | ORAL | Status: DC

## 2010-06-13 NOTE — Progress Notes (Signed)
  Subjective:    [name], female    DOB: Mar 14, 1950, 60 y.o.   MRN: [id number]  HPI Chelsea [name] is here on routine follow up of her medical problems, as outlined in her problem list which I updated today. She has been dealing with grief this past winter having lost a close brother Chelsea [name], who I in fact cared for at [facility]) and her mother in close succession in January and  Feb of this year. After an initial struggle, she got involved in grief counseling, and a grief support group via hospice, which she says has been very helpful. She notes that she was not paying attention to her diet,  and as a result her A1C is moderately elevated. We reviewed the concept of the test, and she noted that her home CBGs have been in the 200 range consistently over the last 6 weeks.  She has had increasing difficulty with knee pain, mostly on the right although both knees hurt quite a bit. Has significant issues with stair climbing-has 5 steps at her front porch-when climbing up. No falls, or near-falls.  Review of Systems No new skin lesions, denies chest discomfort, anginal equivilents, claudication    Objective:   Physical Exam Moves from chair to exam table with difficulty-has to pull herself up from a sitting position.  HEENT normal Lungs CTA CAR RRR Knees: tender medial tibial plateau on right       Assessment & Plan:   1. Increase Metformin to 1,000 mg bid 2. Xrays Bilat knees-refill oxycodone, since she was unable to tolerate the Ibuprofen 3.No labs today Of note, she is up to date on colonoscopy, mammography. Will be due for eye exam on follow up.

## 2010-06-16 ENCOUNTER — Ambulatory Visit (HOSPITAL_COMMUNITY)
Admission: RE | Admit: 2010-06-16 | Discharge: 2010-06-16 | Disposition: A | Discharge status: Home / Self Care | Payer: Medicaid Other | Source: Ambulatory Visit | Attending: Internal Medicine | Admitting: Internal Medicine

## 2010-06-16 DIAGNOSIS — M25561 Pain in right knee: Secondary | ICD-10-CM

## 2010-06-16 DIAGNOSIS — M25569 Pain in unspecified knee: Secondary | ICD-10-CM | POA: Insufficient documentation

## 2010-06-27 NOTE — Consult Note (Signed)
Chelsea [name], Chelsea [name]               ACCOUNT NO.:  [id number]   MEDICAL RECORD NO.:  [id number]          PATIENT TYPE:  INP   LOCATION:  5731                         FACILITY:  [facility]   PHYSICIAN:  Merri Ray. Grandville Silos, M.D.DATE OF BIRTH:  1950-03-17   DATE OF CONSULTATION:  10/17/2006  DATE OF DISCHARGE:                                 CONSULTATION   REFERRING PHYSICIAN:  Dr. Alphia Moh.   CHIEF COMPLAINT:  Right groin abscess.   HISTORY OF PRESENT ILLNESS:  I was asked to evaluate this 60 year old  African American female by Dr. Philbert Riser.  She has a 4-day history of pain  and discharge from her right groin.  She has had a similar problem many  years ago.  She had increasing pain and drainage and she went to Urgent  Care; she was sent to the [facility] Emergency Department for further  evaluation.  She is being admitted by the Teaching Service for IV  antibiotics.  We are asked to evaluate for possible I&D.   PAST MEDICAL HISTORY:  1. Insulin-dependent diabetes mellitus.  2. Asthma.  3. Hypertension.   PAST SURGICAL HISTORY:  She denies.   SOCIAL HISTORY:  She does not smoke.  She occasionally drinks alcohol.   CURRENT MEDICATIONS:  1. Novolin insulin 70/30, fifteen units q.a.m. and 25 units q.p.m.  2. Atenolol 20 mg q.a.m.  3. Lotensin 20 mg q.a.m.  4. Albuterol two puffs p.r.n.  5. Hydrochlorothiazide 25 mg daily.   REVIEW OF SYSTEMS:  As above.   PHYSICAL EXAM:  VITAL SIGNS:  Temperature 97.9, blood pressure 137/83,  heart rate 87, respirations 22.  GENERAL:  She is awake and alert.  LUNGS:  Clear to auscultation with no significant wheezing at this time.  CARDIOVASCULAR:  Heart is regular.  Distal pulses are 1 to 2+.  ABDOMEN:  Soft and nontender.  Bowel sounds are present.  GU:  Right groin examination reveals an ulcer inferior to her labia, 2  cm in size with some foul discharge and minimal necrotic tissue in the  center.  She does have some surrounding  cellulitis and the area is  mildly tender.   DATA REVIEWED:  White blood cell count 24.7.   IMPRESSION:  Right groin abscess.   PLAN:  I agree with IV antibiotics and medical admission.  We will also  take her to the operating room tonight for emergent incision and  drainage.  The procedure, risks and benefits were discussed in detail  with the patient and she is agreeable.      Merri Ray Grandville Silos, M.D.  Electronically Signed     BET/MEDQ  D:  10/17/2006  T:  10/18/2006  Job:  [id number]   cc:   Alphia Moh, MD

## 2010-06-27 NOTE — Discharge Summary (Signed)
NAMEMCKAYLIN, GOLIDAY NO.:  [id number]   MEDICAL RECORD NO.:  [id number]          PATIENT TYPE:  INP   LOCATION:  [id number]                         FACILITY:  [facility]   PHYSICIAN:  Lucy Chris, MD     DATE OF BIRTH:  September 10, 1950   DATE OF ADMISSION:  10/17/2006  DATE OF DISCHARGE:  10/24/2006                               DISCHARGE SUMMARY   DISCHARGE DIAGNOSES:  1. Necrotizing fasciitis.  2. Acute renal failure.  3. Hypotension.  4. Hyponatremia.  5. Diabetes mellitus.  6. Hypertension.  7. Trichomoniasis.   DISCHARGE MEDICATIONS:  1. Ciprofloxacin 400 mg IV daily.  2. Clindamycin 900 mg IV q.8h.  3. IV immunoglobulin 85 gm x1.  4. Sliding-scale insulin.  5. Protonix 40 mg p.o. daily.  6. Zyvox 600 mg IV q.12h. after vancomycin level is less than 20.   DISPOSITION:  Patient is to be transferred to [facility] to continue IV antibiotics, including ciprofloxacin 400 mg IV  daily, clindamycin 900 mg IV q.8h., and Zyvox 600 mg IV q.12h.  The  Zyvox is to be initiated once the patient's vancomycin level is less  than 20.  Also, patient needs hyperbaric oxygen therapy along with  further surgical debridement if needed and possible colostomy for  colonic fistula.  Patient is to have creatinine closely monitored, given  development of acute renal failure during this hospitalization with an  increase in creatinine despite plenty of fluids administered.  Creatinine on admission was 0.96 and is currently creatinine is 5.77.   PROCEDURES PERFORMED:  Patient had a chest x-ray on September 6.  Impression:  Cardiac enlargement without heart failure.  No evidence for  pneumonia.   Repeat chest x-ray on September 8th shows cardiomegaly with development  of mild pulmonary venous congestion.  New right base atelectasis.   Chest x-ray done on October 23, 2006 shows right PICC line tip, upper  SVC.  This could be advanced 5 cm to the SVC/RA junction.   Cardiomegaly,  vascular congestion, and bibasilar atelectasis.   CT of the abdomen done on October 19, 2006.  Impression:  Haziness of  flat planes surrounds both kidneys, particularly on the left; therefore,  infection in the appropriate clinical setting cannot be excluded.  Alternatively, the findings may be chronic in origin.  No findings to  suggest renal collecting system obstruction.  Otherwise, no upper  abdominal or abnormal fluid collection or inflammatory process.   CT scan of the pelvis without contrast as well, October 19, 2006.  Impression:  Soft tissue abscess/fistula suspected located immediately  inferiorly and medial to the right ischium, as described above.  It is  described to be located immediately inferiorly and medial to the right  ischium.  It is a 9.2 x 1.5 cm collection which contain dense material,  possibly barium.   Renal ultrasound done on October 21, 2006.  Impression:  No  hydronephrosis or renal mass.   I&D, incision and drainage and debridement of right groin abscess done  on October 17, 2006.   Repeat incision, drainage, and debridement of  right groin abscess and  incision and drainage of right medial thigh abscess done on October 21, 2006.   Incision, drainage, and debridement of right groin infection on  October 22, 2006 and again incision, drainage, and debridement of right  groin infection on October 23, 2006.   CONSULTATIONS:  1. Odis Hollingshead, M.D. from general surgery.  2. Michel Bickers, M.D. from infectious disease.   BRIEF ADMITTING HISTORY:  This is a 60 year old female patient who  noticed a bump next to her right labia on Monday, which was painful.  Due to increased pain and size, the patient decided to sit in a hot bath  on Wednesday, which she had done about four years prior with a similar  situation.  This abscess was said to have drained pus and blood at that  time, once in a hot bath.  Patient initially claims to  have gotten  better but given the abscess continued to drain and progressively be  more painful along with developing a foul odor, the patient decided to  come to the emergency department.  She currently claims to have  decreased pain, although she does acknowledge some chills, but no  temperature is recorded.   PHYSICAL EXAMINATION:  VITALS:  Temperature 98.9, blood pressure 136/78,  pulse 82, respiratory rate 18, oxygen saturation 98% on room air.  GENERAL:  Patient is in no acute distress.  HEENT:  Pupils are equal, round and reactive to light and accommodation.  Extraocular movements are intact.  Muddy sclerae but anicteric.  Patient  has poor dentition, thrush.  Mucous membranes are moist and pink with  poor dentition noted.  NECK:  Supple.  LUNGS:  Clear to auscultation bilaterally.  HEART:  Regular rate and rhythm with no murmur, rub or gallop noted.  GI:  Positive bowel sounds.  Soft, nontender, nondistended.  EXTREMITIES:  Patient had trace edema noted.  GU:  Patient was noted to have an ulcer measuring approximately 2 cm  inferior to her right labia with some minimal necrotic tissue and foul-  smelling discharge.  There was some surrounding cellulitis with erythema  and induration noted as well.   LABS ON EXAM:  Sodium 131, potassium 3.6, chloride 95, bicarb 23, BUN  11, creatinine 0.96, glucose 509.  WBCs 24.7, hemoglobin 12.3, platelets  302,000, anion gap 13, bilirubin 1, alk phos 182.  AST 19, ALT 16,  protein 8.3, albumin 2.7, calcium 9.3.  UA with greater than 1000  glucose, 40 ketones, a large amount of blood, protein of 100, no  nitrites, a small amount of leukocyte esterase with 11-20 WBCs on  microscopic examination, 3-6 RBCs, few bacteria, and few Trichomonas.   HOSPITAL COURSE:  1. Fournier's gangrene:  Patient initially presented with what      appeared to be a few centimeter sized abscess just inferior to the      right labia.  It was noted to have some  induration and erythema      surrounding it with an ulceration in the center with pus present.      Surgery was consulted, and patient was placed on vancomycin and      Zosyn.  Patient went for I&D on the date of admission, October 17, 2006.  Cultures that were taken initially from the wound eventually      were positive for MRSA, although the Gram's stain revealed      polymicrobial organisms.  Patient's initial white blood  cell count      was 24,000 and decreased to 20.8 thousand by the following day.      Over the weekend, it was noted that the patient's white count began      to increase again to 25,000 on September 6 and then 28,000.  By      Monday, the patient's white count had gone up to 32,000.  CT scan      done on the 6th revealed a second abscess near the ischium.  Also      on Monday, it was noted that the patient had another abscess in the      right inner thigh.  She was taken back to the OR for I&D with      debridement of the right thigh abscess, along with repeat I&D and      debridement of the right groin abscess.  There were plans to take      the patient back to the OR the following day for further      debridement, and at that time, it was noted that the patient had a      necrotizing infection of both the right groin and inner thigh.      Finally, on September 10th, the patient was brought to the OR once      again and noted to have extension of the necrotizing infection      approaching the abdominal wall.  The decision was made to transfer      the patient to [facility] for      hyperbaric oxygen therapy.  Of note, the patient's antibiotics were      switched from vancomycin and Zosyn that were started on September      4th to ciprofloxacin and clindamycin on September 8th, given      patient's acute renal failure and presumptive cause to be ATN      secondary to hypotension with potentially nephrotoxic antibiotics       clouding the picture.  Patient was placed on 400 mg of      ciprofloxacin IV daily as well as clindamycin 900 mg IV q.8h.  Her      vancomycin level was found to be markedly increased in the setting      of the acute renal failure; therefore, continuing coverage for MRSA      that was positive on the wound culture was ongoing, even though      patient has been discontinued off the vancomycin.  At that time, ID      consultation was requested to initiate Zyvox.  Per ID      recommendations, the Zyvox will not be started until patient's      vancomycin level is less than 20.   1. Acute renal failure:  Patient initially came into the emergency      room with a creatinine of approximately 0.97.  Creatinine was noted      the next day to be 1.03 and then continued to climb throughout the      weekend.  Over the weekend, the creatinine actually reached 3.98 by      Sunday night and was 4.59 by Monday morning.  The acute renal      failure was presumed to be secondary to ATN secondary to a short      period of hypotension and possibly complicated by the use of      vancomycin and Zosyn.  1. Hypotension:  As noted in problem #2, the patient had a few      episodes of hypotension, the first one being on September 5th where      a blood pressure was found to be 86/40.  Atenolol was discontinued      at that time, and a fluid bolus was given.  The blood pressure      increased to 119/60.  The following day, September 6th, patient was      noted again to have a blood pressure of 86/44 and again, that      responded well to IV fluids and pressure maintained greater than      [id number] systolic from that point on.  Average blood pressure has been      somewhere between [id number] systolic since that day.   1. Hyponatremia:  Likely cause to be increase in ADH secondary to      infection/sepsis.   1. Diabetes mellitus:  The patient was placed on sliding-scale insulin      on admission.   1. Hypertension:   The patient originally was kept on her home dose of      atenolol but discontinued the following day when the hypotension      was noted.   1. Trichomonas, for which she received Flagyl 2 gm p.o. x1.  HIV, GC,      chlamydia, RPR were all negative.   DISCHARGE LABS/VITALS:  Temperature 97.8, pulse 79, respirations 18,  blood pressure 130/58.  Oxygen saturation 100% on room air.   Sodium 136, potassium 3.9, chloride 113, bicarb 12, glucose 115, BUN 35,  creatinine is 5.77.  WBC is 19.9, hemoglobin 8.6, platelets 300.  Vancomycin level of 39.7.  Lactic acid 0.8.  An arterial blood gas on  room air, pH 7.18, pCO2 23.6, pO2 92.3, bicarb 8.4, and oxygen  saturation of 95.9.      Alphia Moh, MD  Electronically Signed      Lucy Chris, MD  Electronically Signed    MA/MEDQ  D:  10/23/2006  T:  10/23/2006  Job:  [id number]   cc:   Odis Hollingshead, M.D.  Michel Bickers, M.D.

## 2010-06-27 NOTE — Op Note (Signed)
NAMEINES, TOENNIES NO.:  [id number]   MEDICAL RECORD NO.:  [id number]          PATIENT TYPE:  INP   LOCATION:  2107                         FACILITY:  [facility]   PHYSICIAN:  Odis Hollingshead, M.D.DATE OF BIRTH:  10-04-1950   DATE OF PROCEDURE:  10/24/2006  DATE OF DISCHARGE:                               OPERATIVE REPORT   PREOPERATIVE DIAGNOSIS:  Necrotizing right groin infection.   POSTOPERATIVE DIAGNOSIS:  Necrotizing right groin infection.   PROCEDURE:  Right groin wound dressing change and debridement.   SURGEON:  Odis Hollingshead, M.D.   ANESTHESIA:  General.   INDICATIONS:  A 60 year old female with a necrotizing groin who has been  needing debridements and drainage of purulent pockets daily.  Now  presents for the same.  She is due to go to the [facility] for hyperbaric oxygen treatment but no bed is  available.   DESCRIPTION OF PROCEDURE:  She is brought to the operating room and  placed supine on the operating table and general anesthetic was  administered.  She was placed in the lithotomy position.  Right groin  dressing was removed, had some stool on the dressing.  The area was  sterilely prepped and draped.  I inspected the wound and all cavities  and pockets trying to elicit purulent material.  I did debride some  tissue, but for the most part just did a dressing change and no further  pockets of purulent material or necrotic debris were noted.  She  tolerated the procedure well and was taken to the recovery room in  satisfactory condition with minimal blood loss.      Odis Hollingshead, M.D.  Electronically Signed     TJR/MEDQ  D:  10/24/2006  T:  10/25/2006  Job:  [id number]

## 2010-06-27 NOTE — Op Note (Signed)
Chelsea [name], Chelsea [name] NO.:  [id number]   MEDICAL RECORD NO.:  [id number]          PATIENT TYPE:  INP   LOCATION:  2107                         FACILITY:  [facility]   PHYSICIAN:  Odis Hollingshead, M.D.DATE OF BIRTH:  [birth year]   DATE OF PROCEDURE:  10/23/2006  DATE OF DISCHARGE:                               OPERATIVE REPORT   PREOPERATIVE DIAGNOSIS:  Necrotizing right groin infection.   POSTOPERATIVE DIAGNOSIS:  Necrotizing right groin infection.   PROCEDURE:  Repeated incision and drainage and extensive debridement of  necrotizing right groins soft tissue infection.   SURGEON:  Odis Hollingshead, M.D.   ANESTHESIA:  General.   INDICATIONS:  This is a 60 year old diabetic female with polymicrobial  necrotizing right groin infection.  She is now brought back for wound  inspection dressing change.   TECHNIQUE:  She was brought the operating room, placed supine on  operating table and general anesthetic was administered.  She is then  placed in a lithotomy position.  The dressing had fecal soilage on it.  I removed the dressing for right groin region and also from the right  thigh abscess.  There is more purulent drainage and necrotic tissue in  the right thigh region including the tunnel that connected the right  thigh abscess to the right groin abscess.  I excised full-thickness skin  and subcutaneous tissue around the previous right thigh abscess and  debrided this and drained more pockets of infection.  I then removed the  skin bridge between the two abscesses, creating one large wound.  Then I  noticed that the infection was tracking up toward the abdominal wall and  I excised full-thickness skin and broke up loculations and pockets of  purulent drainage.  There was a venous bleeding from a vein which I  ligated both proximal and distal ends.  This was an extensive  debridement, including full-thickness skin and soft tissue down to  muscle including some  muscle.  This process continued to track.  Once I  did not find any further pockets of purulent material, I then packed  wound with saline moistened gauze followed by dry dressing.  She  tolerated procedure without apparent complications and was taken to  recovery in satisfactory condition.  I have discussed the issue her  sister as well as Dr. Bobby Rumpf from infectious disease and the  medical service.  I believe she needs go to a medical center with  hyperbaric oxygen in order to try to halt this process.  They have told  me that they are trying to arrange for her transfer.      Odis Hollingshead, M.D.  Electronically Signed     TJR/MEDQ  D:  10/23/2006  T:  10/24/2006  Job:  [id number]

## 2010-06-27 NOTE — Op Note (Signed)
NAMEARDITH, SLAPPEY               ACCOUNT NO.:  [id number]   MEDICAL RECORD NO.:  [id number]          PATIENT TYPE:  INP   LOCATION:  [id number]                         FACILITY:  [facility]   PHYSICIAN:  Odis Hollingshead, M.D.DATE OF BIRTH:  04/30/1950   DATE OF PROCEDURE:  10/21/2006  DATE OF DISCHARGE:                               OPERATIVE REPORT   PREOPERATIVE DIAGNOSES:  Right groin and a new right thigh abscess.   POSTOPERATIVE DIAGNOSES:  Right groin and a new right thigh abscess.   PROCEDURE:  1. Repeat incision, drainage and debridement of right groin abscess.  2. Incision and drainage of right medial thigh abscess.   SURGEON:  Odis Hollingshead, M.D.   ANESTHESIA:  General.   INDICATIONS:  This is a 7-year female, who has diabetes, had a right  groin abscess drained by [place] in the past.  She now has a rising white  count, increasing tenderness and a firm indurated area on the right  thigh.  She is now brought to the operating room for repeat drainage and  debridement of the right groin abscess and new drainage of the right  thigh abscess.   TECHNIQUE:  She is brought to the operating room and placed supine on  the operating table.  General anesthetic was administered.  She was  placed in the lithotomy position and the groin area was sterilely  prepped and draped.  I approached the old groin wound, and using digital  exam, I identified the tract and broke up loculations draining more  purulent fluid.  I then extended the wound superiorly as well as  inferiorly, and removed some skin to open up to the wound and break up  loculations.  I then debrided necrotic tissue.  I controlled the  bleeding with electrocautery.  I then directed my attention to right  medial thigh abscess close to the groin, and made an incision in this  and took a wedge-shaped area of skin out.  This demonstrated purulent  fluid and a connection with the groin abscess.  I debrided some tissue  of this  aspect as well, and controlled the bleeding with electrocautery.   The hemostasis was adequate.  I then packed both wounds with Kerlix,  packing the tract from the medial area to the groin; with moist Kerlix  and a bulky dressing applied.   She tolerated the procedure without any apparent complications, and was  taken to the recovery room in satisfactory condition.  She will need a  dressing change and likely repeat debridement in the operating room  tomorrow.      Odis Hollingshead, M.D.  Electronically Signed    TJR/MEDQ  D:  10/21/2006  T:  10/22/2006  Job:  [id number]

## 2010-06-27 NOTE — Op Note (Signed)
Chelsea [name], Chelsea [name]               ACCOUNT NO.:  [id number]   MEDICAL RECORD NO.:  [id number]          PATIENT TYPE:  INP   LOCATION:  [id number]                         FACILITY:  [facility]   PHYSICIAN:  Odis Hollingshead, M.D.DATE OF BIRTH:  May 16, 1950   DATE OF PROCEDURE:  10/22/2006  DATE OF DISCHARGE:                               OPERATIVE REPORT   PREOPERATIVE DIAGNOSIS:  Necrotizing right groin infection.   POSTOPERATIVE DIAGNOSIS:  Necrotizing right groin infection.   PROCEDURE:  Incision, drainage and debridement of right groin infection.   SURGEON:  Odis Hollingshead, M.D.   ANESTHESIA:  General.   INDICATIONS:  60 year old female has a complex necrotizing right groin  infection with MRSA.  She is brought back to the operating room today  for dressing change, repeat debridement, and wound inspection.   TECHNIQUE:  She is placed supine on the operating table and general  anesthetic was administered.  She was then placed in the lithotomy  position.  The dressing was removed and some stool stain was on the  outside of the skin.  The area was sterilely prepped and draped.  The  wound just lateral to the labia on the right side, there is some more  necrotic tissue and I broke into another loculated pocket with overlying  indurated skin which I excised full thickness.  I then debrided necrotic  fatty tissue sharply.  Bleeding was controlled with electrocautery.   I then inspected the right thigh wound which was clean.  I irrigated out  both wounds.  I then repacked both wounds with saline soaked gauze  followed by a dry dressing.   She tolerated the procedure well without any apparent complications.  She was taken to recovery in satisfactory condition.  She will need to  be brought back to the operating room tomorrow for repeat dressing  change and possible further debridement.      Odis Hollingshead, M.D.  Electronically Signed     TJR/MEDQ  D:  10/22/2006  T:   10/22/2006  Job:  [id number]

## 2010-06-27 NOTE — Assessment & Plan Note (Signed)
[facility] OFFICE NOTE   NAME:DONNELLAmielia, Ramson                      MRN:          [id number]  DATE:10/30/2007                            DOB:          1951-01-06    Mrs. Susman is a very pleasant 60 year old female who I am asked to  evaluate for chest pain.  She has no prior cardiac history.  She  recently states that she had all of her teeth removed.  She subsequently  after eating began to develop pain like sensation in her left upper  quadrant/left lower rib area.  The pain did not radiate.  It was not  pleuritic in position nor was it exertional.  There is no associated  nausea, vomiting, shortness of breath, or diaphoresis.  It improved  after she was placed on medications for her stomach (omeprazole) though  she typically noticed it after eating.  It was last for approximately 5-  10 minutes.  She does not have exertional chest pain.  She occasionally  has mild dyspnea on exertion.  There is no orthopnea, PND, or pedal  edema.  She has not had syncope.  Because of the above, we were asked to  further evaluate.   She has an allergy to PENICILLIN.   Her medications include lisinopril 20 mg p.o. daily, atenolol 100 mg  p.o. daily, metformin 5 mg p.o. b.i.d., Pravachol 80 mg p.o. daily,  insulin, hydrochlorothiazide 12.5 mg p.o. daily, enteric-coated aspirin  81 mg p.o. daily, omeprazole 20 mg p.o. daily.  She takes Percocet as  needed.   SOCIAL HISTORY:  She does not smoke.  She occasionally consumes alcohol.   Her family history is negative for coronary artery disease.   Her past medical history is significant for diabetes mellitus for  approximately 9-10 years.  She also has hypertension and hyperlipidemia.  She has had a previous abscess in her right lower extremity at turned to  Fournier gangrene.  She apparently had a very complicated course  including necrotizing fasciitis and acute renal failure.   This was in  September 2008.  However, this has slowly improved.  She has had no  other surgeries.   REVIEW OF SYSTEMS:  She denies any headaches or fevers or chills.  There  is no productive cough or hemoptysis.  There is no dysphagia,  odynophagia, melena, or medication.  There is no dysuria or hematuria.  There is no rash or seizure activity.  There is no orthopnea, PND, or  pedal edema.  She does have some residual pain in her right thigh area  from her previous abscess.  Remaining systems are negative.   Her physical examination today shows a blood pressure 160/58, her pulse  is 60.  She weighs 208 pounds.  She is well developed and somewhat obese.  She is in no acute distress  at present.  Skin is warm and dry.  She does not appear to be depressed.  There is no peripheral clubbing.  Her back is normal.  Her HEENT is normal with normal eyelids.  Her neck is supple  with normal upstroke bilaterally.  No bruits noted.  There is no jugular venous distention.  I cannot appreciate thyromegaly.  Her chest is clear to auscultation with normal expansion.  Cardiovascular exam is regular rate and rhythm.  Normal S1 and S2.  There are no murmurs, rubs, or gallops noted.  Her abdominal exam is nontender, nondistended, positive bowel sounds, no  hepatosplenomegaly, no mass appreciated.  There is no abdominal bruit.  She has 2+ femoral pulses bilaterally and no bruits.  Her extremities show mild tenderness over the right thigh area.  I  cannot palpate cords.  There is no edema.  She has 2+ dorsalis pedis  pulses bilaterally.  Neurologic exam is grossly intact.   Electrocardiogram shows a sinus rhythm at a rate of 59.  The axis is  normal.  There are no ST changes noted.   DIAGNOSES:  1. Atypical chest pain - Mrs. Prochnow's chest pain is most likely      noncardiac.  It is more consistent with reflux as it did improve      with omeprazole and occurs after eating.  Note, her       electrocardiogram is normal.  She does have a long history of      diabetes, hypertension, and hyperlipidemia.  Given her risk      factors, we will plan to proceed with a Myoview for risk      stratification.  If it is normal, we will not pursue further      cardiac workup.  2. Hypertension - blood pressure is mildly elevated today.  This will      need to be tracked, and her medications can be increased as needed.      I will leave this to her primary care physician.  3. Hyperlipidemia - she will continue on her statin.  This is being      managed by her primary care physician.  4. Diabetes mellitus.  5. History of Fournier gangrene.   We will see her back on an as-needed basis pending results of her  Myoview.      Denice Bors Stanford Breed, MD, [facility]  Electronically Signed    BSC/MedQ  DD: 10/30/2007  DT: 10/30/2007  Job #: [id number]   cc:   [facility]

## 2010-06-27 NOTE — Op Note (Signed)
NAMEANNELY, Chelsea [name]               ACCOUNT NO.:  [id number]   MEDICAL RECORD NO.:  [id number]          PATIENT TYPE:  INP   LOCATION:  5731                         FACILITY:  [facility]   PHYSICIAN:  Merri Ray. Grandville Silos, M.D.DATE OF BIRTH:  1950/05/07   DATE OF PROCEDURE:  10/17/2006  DATE OF DISCHARGE:                               OPERATIVE REPORT   PREOPERATIVE DIAGNOSIS:  Right groin abscess.   POSTOPERATIVE DIAGNOSIS:  Right groin abscess with some tissue necrosis.   PRIMARY PROCEDURE:  Incision, drainage and debridement, right groin  abscess.   SURGEON:  Merri Ray. Grandville Silos, M.D.   ANESTHESIA:  General.   HISTORY OF PRESENT ILLNESS:  Chelsea [name] is a 60 year old African  American female with insulin dependent diabetes who I evaluated in the  emergency department this evening for a right groin abscess.  There was  evidence of some superficial necrotic tissue and she is brought  emergently to the operating room for incision, drainage and debridement.  She is currently receiving intravenous antibiotics and is being admitted  to the teaching service.   PROCEDURE IN DETAIL:  Informed consent was obtained.  The patient  received antibiotics as above.  Her site was marked.  She was brought to  the operating room.  General anesthesia was administered by the  anesthesia staff.  She was placed in the lithotomy position.  Her  bilateral groins were prepped and draped in a sterile fashion.  Right  groin ulcer was probed.  There was purulent material expressed and this  was sent for aerobic and anaerobic cultures.  The entire ulcer was then  excised with some surrounding tissue going back to viable skin.  The  underlying tissues were also debrided.  There was some scattered tissue  necrosis that did not involve the underlying fascia.  All devitalized  tissues were debrided and the abscess cavity was cleaned out.  The wound  was copiously irrigated with saline.  Meticulous hemostasis was  ensured,  no further pockets were encountered.  After ensuring hemostasis, the  wound was packed with 1 inch Iodoform gauze and a bulky sterile dressing  was applied.  The patient tolerated the procedure well without apparent  complications, was taken to the recovery room in stable condition.      Merri Ray Grandville Silos, M.D.  Electronically Signed     BET/MEDQ  D:  10/17/2006  T:  10/18/2006  Job:  [id number]   cc:   Lucy Chris, MD

## 2010-06-29 ENCOUNTER — Other Ambulatory Visit: Payer: Self-pay | Admitting: Internal Medicine

## 2010-06-29 DIAGNOSIS — I10 Essential (primary) hypertension: Secondary | ICD-10-CM

## 2010-06-30 NOTE — Consult Note (Signed)
[facility]  Patient:    Chelsea [name], Chelsea [name] Visit Number: [id number] MRN: [id number]          Service Type: FTC Location: FOOT Attending Physician:  Danny Lawless Dictated by:   Orlando Penner London Pepper, M.D. Proc. Date: 05/22/01 Admit Date:  05/22/2001   CC:         Berkley Harvey, M.D.   Consultation Report  HISTORY:  This 60 year old black female is seen at the courtesy of Dr. Redmond Baseman for assistance with management of painful calluses of the feet.  Patient has had type 2 diabetes for approximately eight or nine years and is currently managed with oral agents alone.  Her degree of control is not entirely certain.  She has had no recognized complications of the disease and has had no particular neuropathic symptoms.  Nonetheless, she has developed some clawing of the toes and recently has noted the appearance of painful calluses at several locations on the plantar aspects of her feet to include at the left heel as well.  She is referred now for our consultation and advice regarding these lesions.  PAST MEDICAL HISTORY:  The patient also has asthma, hypertension, and arthritis.  ALLERGIES:  PENICILLIN.  REGULAR MEDICATIONS:  Lotensin, hydrochlorothiazide, atenolol, Darvocet-N 100, albuterol inhaler, Glipizide 10 mg b.i.d.  PHYSICAL EXAMINATION  EXTREMITIES:  Limited to the distal lower extremities.  The feet are without gross deformity, although there is substantial clawing of the nails bilaterally getting very close to hammer toe circumstances with one or two of her toes.  The feet are free of edema.  Skin temperatures are normal and symmetrical.  Pulses are everywhere palpable and adequate.  Monofilament testing shows that protective sensation is intact throughout.  Underlying the first metatarsal heads bilaterally are thick calluses with that on the left foot being significantly hemorrhagic.  There is in addition a callus underlying the  fourth metatarsal head on the left and also a callus at the lateral aspect of the left heel.  DISPOSITION: 1. The patient is given instruction regarding foot care and diabetes by video    with nurse and physician reinforcement. 2. The calluses aforementioned are all sharply pared, especially to include    the hemorrhagic callus underlying the first metatarsal head on the left    without incident.  It is possible to clear away the entire hemorrhagic area    without creating any ulceration.  Both heels are dremeled and following    this the more heavily callused area on the left heel is sharply pared as    well.  This is well tolerated. 3. The patients shoes are assessed and are found to be too short and marginal    in width as well. 4. The patient is given a prescription to consult [place] [facility]    Supply for preparation of custom diabetic inserts and for advice regarding    more adequate footwear. 5. Follow-up visit will be to this clinic in six weeks. Dictated by:   Orlando Penner London Pepper, M.D. Attending Physician:  Danny Lawless DD:  05/22/01 TD:  05/22/01 Job: [id number]

## 2010-07-11 ENCOUNTER — Ambulatory Visit (INDEPENDENT_AMBULATORY_CARE_PROVIDER_SITE_OTHER): Payer: Medicaid Other | Admitting: Internal Medicine

## 2010-07-11 ENCOUNTER — Encounter: Payer: Self-pay | Admitting: Internal Medicine

## 2010-07-11 DIAGNOSIS — J302 Other seasonal allergic rhinitis: Secondary | ICD-10-CM

## 2010-07-11 DIAGNOSIS — J309 Allergic rhinitis, unspecified: Secondary | ICD-10-CM

## 2010-07-11 DIAGNOSIS — M25561 Pain in right knee: Secondary | ICD-10-CM

## 2010-07-11 DIAGNOSIS — M79609 Pain in unspecified limb: Secondary | ICD-10-CM

## 2010-07-11 DIAGNOSIS — I10 Essential (primary) hypertension: Secondary | ICD-10-CM

## 2010-07-11 DIAGNOSIS — M25569 Pain in unspecified knee: Secondary | ICD-10-CM

## 2010-07-11 DIAGNOSIS — E1139 Type 2 diabetes mellitus with other diabetic ophthalmic complication: Secondary | ICD-10-CM

## 2010-07-11 DIAGNOSIS — F4321 Adjustment disorder with depressed mood: Secondary | ICD-10-CM

## 2010-07-11 DIAGNOSIS — J45909 Unspecified asthma, uncomplicated: Secondary | ICD-10-CM

## 2010-07-11 DIAGNOSIS — E785 Hyperlipidemia, unspecified: Secondary | ICD-10-CM

## 2010-07-11 DIAGNOSIS — E119 Type 2 diabetes mellitus without complications: Secondary | ICD-10-CM

## 2010-07-11 MED ORDER — LORATADINE 10 MG PO TABS: 10.0000 mg | ORAL_TABLET | Freq: Every day | ORAL | Status: DC

## 2010-07-11 MED ORDER — ROSUVASTATIN CALCIUM 5 MG PO TABS: 5.0000 mg | ORAL_TABLET | Freq: Every day | ORAL | Status: DC

## 2010-07-11 MED ORDER — ALBUTEROL SULFATE HFA 108 (90 BASE) MCG/ACT IN AERS: 2.0000 | INHALATION_SPRAY | Freq: Four times a day (QID) | RESPIRATORY_TRACT | Status: DC | PRN

## 2010-07-11 MED ORDER — FLUTICASONE PROPIONATE 50 MCG/ACT NA SUSP: 2.0000 | Freq: Two times a day (BID) | NASAL | Status: DC

## 2010-07-11 MED ORDER — OXYCODONE-ACETAMINOPHEN 5-325 MG PO TABS: 1.0000 | ORAL_TABLET | Freq: Four times a day (QID) | ORAL | Status: DC | PRN

## 2010-07-11 NOTE — Assessment & Plan Note (Signed)
I encouraged patient to continue counseling and she seems to be doing very well with regards to this.

## 2010-07-11 NOTE — Assessment & Plan Note (Signed)
Patient is a bit above goal with this today. We did not make any changes in her medications however she could go up and her lisinopril. We'll follow this at her next visit in 2-3 months.

## 2010-07-11 NOTE — Patient Instructions (Signed)
Continue to do a great job with your diabetic control.  We will see you back in 2-3 months for a HgbA1c check and fasting lipid panel.  Please pick up your refills at the pharmacy.  If the crestor is too expensive, we will try another cholesterol medication when you return to [place].  Continue your knee exercises and medicine as needed. The counseling seems to be helping you a great deal - please continue this as you need. Also, you will need an eye exam soon; if you do not hear from your ophthalmologist by the time you return to [place], please let us know so we can send a referral.

## 2010-07-11 NOTE — Assessment & Plan Note (Addendum)
We refilled the albuterol inhaler today. Patient reports that her old inhaler was expired.

## 2010-07-11 NOTE — Progress Notes (Signed)
  Subjective:    [name], female    DOB: 12/05/1950, 60 y.o.   MRN: [id number]  HPI Comments: Ms. Egbers presents for regular followup after seeing Dr. Jobe Igo approximately one month ago.  At that time he increased her metformin dose in order to x-rays of the patient's right knee. He's x-ray showed mild degenerative changes. The patient also had hemoglobin A1c that was improving at 8.3%.   Today patient comes in complaining of some clear nasal discharge and itchy eyes associated with seasonal allergies. She states she like refills of her albuterol Flonase and maybe another medicine to help with her allergies.   She states she is be doing better with her physical therapy with regards to her knees and also using Percocet 2-3 times a day and this is working well to relieve her pain. She was interested in knowing the results of her x-rays and these were relayed to her.   Otherwise the patient attending weekly counseling on Wednesdays in relation to the recent death of her brother. She is doing much better with regards to this. She seeing some place called [facility] personal services.     Review of Systems  Constitutional: Negative for fever, chills and diaphoresis.  HENT: Positive for rhinorrhea and postnasal drip. Negative for sore throat and trouble swallowing.   Eyes: Positive for itching. Negative for visual disturbance.  Respiratory: Negative for cough, chest tightness and shortness of breath.   Cardiovascular: Negative for chest pain, palpitations and leg swelling.  Gastrointestinal: Negative for nausea, vomiting, abdominal pain, diarrhea, constipation and blood in stool.  Genitourinary: Negative for dysuria, urgency, frequency and difficulty urinating.  Musculoskeletal: Positive for arthralgias. Negative for myalgias.  Skin: Negative for rash.  Neurological: Negative for dizziness, weakness, numbness and headaches.  Hematological: Negative for adenopathy.    Psychiatric/Behavioral: Negative for behavioral problems, confusion and dysphoric mood.  All other systems reviewed and are negative.       Objective:   Physical Exam  Constitutional: She is oriented to person, place, and time. She appears well-developed and well-nourished. No distress.  HENT:  Head: Normocephalic and atraumatic.  Mouth/Throat: No oropharyngeal exudate.  Eyes: Conjunctivae and EOM are normal. Pupils are equal, round, and reactive to light.  Neck: Normal range of motion. Neck supple.  Cardiovascular: Normal rate, regular rhythm and intact distal pulses.   No murmur heard. Pulmonary/Chest: Effort normal and breath sounds normal. She has no wheezes.  Abdominal: Soft. Bowel sounds are normal. She exhibits no distension and no mass. There is no tenderness. There is no rebound and no guarding.  Musculoskeletal: Normal range of motion. She exhibits no edema.       Right knee with medial tenderness to palpation and some pain with active flexion and extension  Neurological: She is alert and oriented to person, place, and time. No cranial nerve deficit. Coordination normal.  Skin: Skin is warm and dry. No rash noted.  Psychiatric: She has a normal mood and affect. Her behavior is normal. Judgment and thought content normal.          Assessment & Plan:

## 2010-07-11 NOTE — Assessment & Plan Note (Signed)
Patient is doing better with the last hemoglobin A1c of 8.3%. She is to followup with [place] in 2-3 months whereby she will need a repeat hemoglobin A1c. I did not make any changes in her medications. She's been tolerating the new metformin dose was a mild increase in diarrhea but otherwise is doing well. Her fasting blood glucose in the office today was excellent at 102.

## 2010-07-11 NOTE — Assessment & Plan Note (Signed)
Patient stopped taking her Vytorin in December 2011 as it was thought related to her knee pain. She does require some statin. She is at his optimal control previously therefore today we will try Crestor 5 mg daily. However this is not generic and that she is Medicaid she may have trouble sorting this. At followup will be necessary to have a fasting lipid panel and to confirm that she is able to take Crestor. Another option may be low-dose Lipitor however I'm not sure which one may be cheaper for her.

## 2010-07-11 NOTE — Assessment & Plan Note (Signed)
Patient was last seen by a retinologist approximately one year ago and underwent laser in her eyes for diabetic retinopathy. At her followup visit she will need referral to ophthalmologist she's not been contacted by her previous ophthalmologist for followup.

## 2010-07-11 NOTE — Assessment & Plan Note (Signed)
Refilled the patient's Percocet #90 today. Patient's continued use for physical therapy exercise and I encouraged her to continue these.

## 2010-08-14 ENCOUNTER — Other Ambulatory Visit: Payer: Self-pay | Admitting: *Deleted

## 2010-08-14 MED ORDER — GLUCOSE BLOOD VI STRP: 1.0000 | ORAL_STRIP | Freq: Every day | Status: DC

## 2010-08-15 ENCOUNTER — Encounter: Payer: Self-pay | Admitting: Internal Medicine

## 2010-08-15 ENCOUNTER — Ambulatory Visit (INDEPENDENT_AMBULATORY_CARE_PROVIDER_SITE_OTHER): Payer: Medicaid Other | Admitting: Internal Medicine

## 2010-08-15 VITALS — BP 158/69 | HR 76 | Temp 97.5°F | Ht 66.0 in | Wt 228.0 lb

## 2010-08-15 DIAGNOSIS — E1169 Type 2 diabetes mellitus with other specified complication: Secondary | ICD-10-CM

## 2010-08-15 DIAGNOSIS — I10 Essential (primary) hypertension: Secondary | ICD-10-CM

## 2010-08-15 DIAGNOSIS — F4320 Adjustment disorder, unspecified: Secondary | ICD-10-CM

## 2010-08-15 DIAGNOSIS — Z0000 Encounter for general adult medical examination without abnormal findings: Secondary | ICD-10-CM

## 2010-08-15 DIAGNOSIS — E118 Type 2 diabetes mellitus with unspecified complications: Secondary | ICD-10-CM

## 2010-08-15 DIAGNOSIS — Z87448 Personal history of other diseases of urinary system: Secondary | ICD-10-CM

## 2010-08-15 DIAGNOSIS — E1139 Type 2 diabetes mellitus with other diabetic ophthalmic complication: Secondary | ICD-10-CM

## 2010-08-15 DIAGNOSIS — E785 Hyperlipidemia, unspecified: Secondary | ICD-10-CM

## 2010-08-15 DIAGNOSIS — G4700 Insomnia, unspecified: Secondary | ICD-10-CM

## 2010-08-15 DIAGNOSIS — M79609 Pain in unspecified limb: Secondary | ICD-10-CM

## 2010-08-15 DIAGNOSIS — L97509 Non-pressure chronic ulcer of other part of unspecified foot with unspecified severity: Secondary | ICD-10-CM

## 2010-08-15 DIAGNOSIS — E119 Type 2 diabetes mellitus without complications: Secondary | ICD-10-CM

## 2010-08-15 MED ORDER — ZOLPIDEM TARTRATE 10 MG PO TABS: 10.0000 mg | ORAL_TABLET | Freq: Every evening | ORAL | Status: AC | PRN

## 2010-08-15 MED ORDER — FLUOXETINE HCL 20 MG PO CAPS: 20.0000 mg | ORAL_CAPSULE | Freq: Every day | ORAL | Status: DC

## 2010-08-15 NOTE — Assessment & Plan Note (Signed)
This is likely an adjustment reaction 2/2 many stressors in her life.  Pt has been in a group therapy and stated that it helped partially.  .  -Will add fluoxetine and ambien to her regimen.  Side effects of SSRIs were explained to patient and she voiced understanding. -Patient was instructed to stop medication if she experiences increased in depression or have SI/HI, she needs to stop the medication and to notify our office. -Continue group therapy

## 2010-08-15 NOTE — Assessment & Plan Note (Signed)
Pt is due for her ophthalmology care this year. Will find a ophthalmologist for her for follow up.

## 2010-08-15 NOTE — Progress Notes (Signed)
Patient seen and examined with Dr. Nicoletta Dress. I agree with her entire assessment and plan.

## 2010-08-15 NOTE — Assessment & Plan Note (Addendum)
BP 158/69 on this visit. Pt has been very stressful dealing with family loss and was quite emotional and tearful during the entire visit. Given all above factors, instructed pt to check BP at home and bring her log to next visit. -Continue Lisinopril, hctz, and atenolol.

## 2010-08-15 NOTE — Assessment & Plan Note (Addendum)
Pt right leg is well controlled by her current regimen. Knee Xray showed DJD.   She stated that she takes percocet 2-3 times daily and is satisfied with current treatment. -Continue current regimen
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
## 2010-08-15 NOTE — Assessment & Plan Note (Signed)
Addressed under Unspecified adjustment reaction

## 2010-08-15 NOTE — Assessment & Plan Note (Addendum)
Pt is on Crestor and tolerated well. No side effect noted. Will recheck her FLP during the next clinic visit -Will continue Crestor

## 2010-08-15 NOTE — Assessment & Plan Note (Addendum)
Office random BG 94 (pt ate the breakfast), pt thought that she brought the meter with her but unable to locate it. Pt stated that she check her BG regularly at home, and ranging from 100-182, with 182 being highest reading. Pt last HbA1c was 8.3% in May of this year.  Pt is due for another HbA1C in August. Instructed pt to continue to monitor her BG and bring the meter to clinic next visit. -Continue current regimen -Patient has elevated alb/cr ratio and is currently on ACEi

## 2010-08-15 NOTE — Patient Instructions (Signed)
1. Continue to monitor your BP at home and bring the log to clinic next visit 2. Continue to check your blood sugar as instructed. Bring the meter to the clinic for every visit 3. Continue the group therapy for your grief. Will prescribe Fluxetine for you 4. Follow up with the clinic in 2 weeks 5. Please schedule an appointment with [facility] [place] for your foot care.   6. Continue to follow up with Ophthalmology for your eye care.

## 2010-08-21 ENCOUNTER — Other Ambulatory Visit: Payer: Self-pay | Admitting: *Deleted

## 2010-08-21 DIAGNOSIS — E1139 Type 2 diabetes mellitus with other diabetic ophthalmic complication: Secondary | ICD-10-CM

## 2010-08-21 DIAGNOSIS — E785 Hyperlipidemia, unspecified: Secondary | ICD-10-CM

## 2010-08-21 DIAGNOSIS — I10 Essential (primary) hypertension: Secondary | ICD-10-CM

## 2010-08-21 DIAGNOSIS — M79609 Pain in unspecified limb: Secondary | ICD-10-CM

## 2010-08-21 DIAGNOSIS — E119 Type 2 diabetes mellitus without complications: Secondary | ICD-10-CM

## 2010-08-21 DIAGNOSIS — M25561 Pain in right knee: Secondary | ICD-10-CM

## 2010-08-21 DIAGNOSIS — J45909 Unspecified asthma, uncomplicated: Secondary | ICD-10-CM

## 2010-08-21 DIAGNOSIS — F4321 Adjustment disorder with depressed mood: Secondary | ICD-10-CM

## 2010-08-21 DIAGNOSIS — J302 Other seasonal allergic rhinitis: Secondary | ICD-10-CM

## 2010-08-21 MED ORDER — OXYCODONE-ACETAMINOPHEN 5-325 MG PO TABS: 1.0000 | ORAL_TABLET | Freq: Four times a day (QID) | ORAL | Status: DC | PRN

## 2010-08-21 NOTE — Telephone Encounter (Signed)
Last refill 5/29

## 2010-08-21 NOTE — Telephone Encounter (Signed)
Reviewed [employer] - No narcotic contract. I do not see a single UDS in [employer] nor [employer]. Has appt later this month - needs contract and UDS. Last refill 5/29 #90 Will refill on month only

## 2010-08-22 ENCOUNTER — Other Ambulatory Visit: Payer: Medicaid Other | Admitting: Internal Medicine

## 2010-08-22 DIAGNOSIS — Z87448 Personal history of other diseases of urinary system: Secondary | ICD-10-CM

## 2010-08-22 DIAGNOSIS — E785 Hyperlipidemia, unspecified: Secondary | ICD-10-CM

## 2010-08-22 LAB — LIPID PANEL
Cholesterol: 220 mg/dL — ABNORMAL HIGH (ref 0–200)
HDL: 58 mg/dL (ref >39)
Total CHOL/HDL Ratio: 3.8 Ratio
Triglycerides: 131 mg/dL (ref <150)
VLDL: 26 mg/dL (ref 0–40)

## 2010-08-23 LAB — BASIC METABOLIC PANEL WITH GFR
BUN: 35 mg/dL — ABNORMAL HIGH (ref 6–23)
Calcium: 9.7 mg/dL (ref 8.4–10.5)
Chloride: 109 mEq/L (ref 96–112)
Creat: 1.15 mg/dL — ABNORMAL HIGH (ref 0.50–1.10)
GFR, Est Non African American: 48 mL/min — ABNORMAL LOW (ref >60)

## 2010-08-28 NOTE — Telephone Encounter (Signed)
Pt has an appointment on 07/20 with me. - will not do percocet refill - will have pt sign pain contract and check UDS next visit

## 2010-09-01 ENCOUNTER — Encounter: Payer: Self-pay | Admitting: Internal Medicine

## 2010-09-01 ENCOUNTER — Ambulatory Visit (INDEPENDENT_AMBULATORY_CARE_PROVIDER_SITE_OTHER): Payer: Medicaid Other | Admitting: Internal Medicine

## 2010-09-01 DIAGNOSIS — E785 Hyperlipidemia, unspecified: Secondary | ICD-10-CM

## 2010-09-01 DIAGNOSIS — E119 Type 2 diabetes mellitus without complications: Secondary | ICD-10-CM

## 2010-09-01 DIAGNOSIS — E118 Type 2 diabetes mellitus with unspecified complications: Secondary | ICD-10-CM

## 2010-09-01 DIAGNOSIS — F4320 Adjustment disorder, unspecified: Secondary | ICD-10-CM

## 2010-09-01 DIAGNOSIS — M79609 Pain in unspecified limb: Secondary | ICD-10-CM

## 2010-09-01 DIAGNOSIS — E1139 Type 2 diabetes mellitus with other diabetic ophthalmic complication: Secondary | ICD-10-CM

## 2010-09-01 DIAGNOSIS — L97509 Non-pressure chronic ulcer of other part of unspecified foot with unspecified severity: Secondary | ICD-10-CM

## 2010-09-01 DIAGNOSIS — E1169 Type 2 diabetes mellitus with other specified complication: Secondary | ICD-10-CM

## 2010-09-01 NOTE — Patient Instructions (Addendum)
1. Will follow up on your kidney function I already ordered your lab.  2. Continue the current medications. 3. Follow up with me in 2 weeks

## 2010-09-02 LAB — DRUG SCREEN, URINE
Cocaine Metabolites: NEGATIVE
Marijuana Metabolite: NEGATIVE
Opiates: NEGATIVE
Phencyclidine (PCP): NEGATIVE
Propoxyphene: NEGATIVE

## 2010-09-02 LAB — MICROALBUMIN / CREATININE URINE RATIO: Microalb, Ur: 1.34 mg/dL (ref 0.00–1.89)

## 2010-09-03 NOTE — Assessment & Plan Note (Signed)
Pt states that she has seen Dr. Briscoe Burns. Sheard for her feet check,  and she was told that everything was fine. - will encourage her to follow up as scheduled.

## 2010-09-03 NOTE — Assessment & Plan Note (Signed)
Pt states that she has already made an appointment on 09/08/10 with Dr. Cleotis Lema for ophthalmology check. - will ask the medical records.

## 2010-09-03 NOTE — Assessment & Plan Note (Signed)
-  denies any depressed mood. -states that fluoxetine helps her a lot. -will continue the current therapy.

## 2010-09-03 NOTE — Assessment & Plan Note (Signed)
Pt admits that she has not taken her Crestor as prescribed regularly. - importance of treatment for hyperlipidemia explained to pt. - Pt understands and agrees to stick to her treatment plan. -she has abnormal lipid panel during this visit. Given her recent h/o noncompliance, will follow up her lipid panel in the future.
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
## 2010-09-03 NOTE — Assessment & Plan Note (Signed)
Her pain is welled controlled by her current regimen. She states that her last dose was in the morning of 07/19.  - Pt agrees to sign PAIN CONTRACT today. - will check her urine toxicology.

## 2010-09-04 NOTE — Progress Notes (Signed)
I agree with assessment and plan as per Dr. Nicoletta Dress.

## 2010-09-05 LAB — OPIATE, QUANTITATIVE, URINE
Hydrocodone: NEGATIVE NG/ML
Hydromorphone - Total: NEGATIVE NG/ML
Oxymorphone: NEGATIVE NG/ML

## 2010-09-15 ENCOUNTER — Encounter: Payer: Medicaid Other | Admitting: Internal Medicine

## 2010-09-15 ENCOUNTER — Ambulatory Visit (INDEPENDENT_AMBULATORY_CARE_PROVIDER_SITE_OTHER): Payer: Medicaid Other | Admitting: Internal Medicine

## 2010-09-15 ENCOUNTER — Encounter: Payer: Self-pay | Admitting: Internal Medicine

## 2010-09-15 VITALS — BP 142/56 | HR 64 | Temp 96.9°F | Ht 65.0 in | Wt 223.9 lb

## 2010-09-15 DIAGNOSIS — E785 Hyperlipidemia, unspecified: Secondary | ICD-10-CM

## 2010-09-15 DIAGNOSIS — F4320 Adjustment disorder, unspecified: Secondary | ICD-10-CM

## 2010-09-15 DIAGNOSIS — E119 Type 2 diabetes mellitus without complications: Secondary | ICD-10-CM

## 2010-09-15 DIAGNOSIS — I10 Essential (primary) hypertension: Secondary | ICD-10-CM

## 2010-09-15 LAB — BASIC METABOLIC PANEL WITH GFR
BUN: 33 mg/dL — ABNORMAL HIGH (ref 6–23)
CO2: 21 mEq/L (ref 19–32)
Chloride: 109 mEq/L (ref 96–112)
Creat: 1.04 mg/dL (ref 0.50–1.10)

## 2010-09-15 NOTE — Patient Instructions (Addendum)
   Please follow-up at the clinic in 3 months, at which time we will reevaluate your diabetes, blood pressure.  If you are diabetic, please bring your meter to your next visit.  Keep up all of your great work and efforts!!!! Your diabetes is doing much better, you are losing weight, making great progress!!!!  Please bring all of your medications in a bag to your next visit.   Sodium-Controlled Diet Sodium is a mineral. It is found in many foods. Sodium may be found naturally or added during the making of a food. The most common form of sodium is salt, which is made up of sodium and chloride. Reducing your sodium intake involves changing your eating habits. The following guidelines will help you reduce the sodium in your diet:  Stop using the salt shaker.   Use salt sparingly in cooking and baking.   Substitute with sodium-free seasonings and spices.   Do not use a salt substitute (potassium chloride) without your caregiver's permission.   Include a variety of fresh, unprocessed foods in your diet.   Limit the use of processed and convenience foods that are high in sodium.  USE THE FOLLOWING FOODS SPARINGLY: Breads/Starches  Commercial bread stuffing, commercial pancake or waffle mixes, coating mixes. Waffles. Croutons. Prepared (boxed or frozen) potato, rice, or noodle mixes that contain salt or sodium. Salted [place] fries or hash browns. Salted popcorn, breads, crackers, chips, or snack foods.  Vegetables  Vegetables canned with salt or prepared in cream, butter, or cheese sauces. Sauerkraut. Tomato or vegetable juices canned with salt.   Fresh vegetables are allowed if rinsed thoroughly.  Fruit  Fruit is okay to eat.  Meat and Meat Substitutes  Salted or smoked meats, such as bacon or Canadian bacon, chipped or corned beef, hot dogs, salt pork, luncheon meats, pastrami, ham, or sausage. Canned or smoked fish, poultry, or meat. Processed cheese or cheese spreads, blue or  [place] cheese. Battered or frozen fish products. Prepared spaghetti sauce. Baked beans. Reuben sandwiches. Salted nuts. Caviar.  Milk  Limit buttermilk to 1 cup per week.  Soups and Combination Foods  Bouillon cubes, canned or dried soups, broth, consomm. Convenience (frozen or packaged) dinners with more than 600 milligrams (mg) sodium. Pot pies, pizza, Asian food, fast food cheeseburgers, and specialty sandwiches.  Desserts and Sweets  Regular (salted) desserts, pie, commercial fruit snack pies, commercial snack cakes, canned puddings.   Eat desserts and sweets in moderation.  Fats and Oils  Gravy mixes or canned gravy. No more than 1 to 2 tablespoons of salad dressing. Chip dips.   Eat fats and oils in moderation.  Beverages  See those listed under the vegetables and milk groups.  Condiments/Miscellaneous  Ketchup, mustard, meat sauces, salsa, regular (salted) and lite soy sauce or mustard. Dill pickles, olives, meat tenderizer. Prepared horseradish or pickle relish. Dutch-processed cocoa. Baking powder or baking soda used medicinally. Worcestershire sauce. "Light" salt. Salt substitute, unless approved by your caregiver.  Document Released: 07/21/2001 Document Re-Released: 04/25/2009 [facility] Patient Information 2011 [place].

## 2010-09-15 NOTE — Assessment & Plan Note (Addendum)
-   I again emphasized continued medication compliance with her Crestor. - Recommended continued efforts towards diet and exercise as tolerated.

## 2010-09-15 NOTE — Assessment & Plan Note (Addendum)
Stabilizing. - Counseled to take Prozac daily instead of on an as-needed basis as the patient is currently doing.

## 2010-09-15 NOTE — Assessment & Plan Note (Signed)
BP Readings from Last 3 Encounters:  09/15/10 142/56  09/01/10 114/63  08/15/10 0000000    Basic Metabolic Panel:    Component Value Date/Time   NA 139 08/22/2010 0856   K 4.9 08/22/2010 0856   CL 109 08/22/2010 0856   CO2 18* 08/22/2010 0856   BUN 35* 08/22/2010 0856   CREATININE 1.15* 08/22/2010 0856   CREATININE 0.95 02/02/2010 1452   GLUCOSE 203* 08/22/2010 0856   CALCIUM 9.7 08/22/2010 0856    Assessment: Hypertension control:   mildly elevated  Progress toward goals:   unchanged Barriers to meeting goals:  no barriers identified  Plan: Hypertension treatment:  - Continue current medications for now as pt prefers to continue her lifestyle changes prior to starting new medication. - Will closely monitor as pt has had persistently elevated blood pressure at previous visits. - Can consider adding Amlodipine if lifestyle modification is ineffective alone. - Handout given regarding low-sodium diet. - Encouraged diet/ exercise.

## 2010-09-15 NOTE — Assessment & Plan Note (Addendum)
Improved. A1c is 6.9 today! She is making excellent progress.  - Continue current therapy. - Has completed her eye exam. - Urine microalb/cr within normal limits - BMET today. - Congratulated on her efforts and progress.
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
## 2010-09-21 ENCOUNTER — Other Ambulatory Visit: Payer: Self-pay | Admitting: *Deleted

## 2010-09-21 DIAGNOSIS — M79609 Pain in unspecified limb: Secondary | ICD-10-CM

## 2010-09-21 DIAGNOSIS — M25561 Pain in right knee: Secondary | ICD-10-CM

## 2010-09-21 DIAGNOSIS — J45909 Unspecified asthma, uncomplicated: Secondary | ICD-10-CM

## 2010-09-21 DIAGNOSIS — I10 Essential (primary) hypertension: Secondary | ICD-10-CM

## 2010-09-21 DIAGNOSIS — F4321 Adjustment disorder with depressed mood: Secondary | ICD-10-CM

## 2010-09-21 DIAGNOSIS — E119 Type 2 diabetes mellitus without complications: Secondary | ICD-10-CM

## 2010-09-21 DIAGNOSIS — J302 Other seasonal allergic rhinitis: Secondary | ICD-10-CM

## 2010-09-21 DIAGNOSIS — E785 Hyperlipidemia, unspecified: Secondary | ICD-10-CM

## 2010-09-21 DIAGNOSIS — E1139 Type 2 diabetes mellitus with other diabetic ophthalmic complication: Secondary | ICD-10-CM

## 2010-09-21 MED ORDER — OXYCODONE-ACETAMINOPHEN 5-325 MG PO TABS: 1.0000 | ORAL_TABLET | Freq: Four times a day (QID) | ORAL | Status: DC | PRN

## 2010-10-23 ENCOUNTER — Other Ambulatory Visit: Payer: Self-pay | Admitting: *Deleted

## 2010-10-23 DIAGNOSIS — I10 Essential (primary) hypertension: Secondary | ICD-10-CM

## 2010-10-23 DIAGNOSIS — M79609 Pain in unspecified limb: Secondary | ICD-10-CM

## 2010-10-23 DIAGNOSIS — F4321 Adjustment disorder with depressed mood: Secondary | ICD-10-CM

## 2010-10-23 DIAGNOSIS — E1139 Type 2 diabetes mellitus with other diabetic ophthalmic complication: Secondary | ICD-10-CM

## 2010-10-23 DIAGNOSIS — J45909 Unspecified asthma, uncomplicated: Secondary | ICD-10-CM

## 2010-10-23 DIAGNOSIS — M25561 Pain in right knee: Secondary | ICD-10-CM

## 2010-10-23 DIAGNOSIS — J302 Other seasonal allergic rhinitis: Secondary | ICD-10-CM

## 2010-10-23 DIAGNOSIS — E785 Hyperlipidemia, unspecified: Secondary | ICD-10-CM

## 2010-10-23 DIAGNOSIS — E119 Type 2 diabetes mellitus without complications: Secondary | ICD-10-CM

## 2010-10-23 MED ORDER — OXYCODONE-ACETAMINOPHEN 5-325 MG PO TABS: 1.0000 | ORAL_TABLET | Freq: Four times a day (QID) | ORAL | Status: DC | PRN

## 2010-10-23 NOTE — Telephone Encounter (Signed)
Must give UDS when she comes in for this script.  Please ask if she is taking the percocet and when she last took it.

## 2010-10-23 NOTE — Telephone Encounter (Signed)
Last refill 8/9 # 90 Call pt when ready @ [phone]

## 2010-10-24 NOTE — Telephone Encounter (Signed)
Tried to call pt, no answer, no vmail

## 2010-10-25 ENCOUNTER — Other Ambulatory Visit: Payer: Medicaid Other

## 2010-10-25 ENCOUNTER — Other Ambulatory Visit: Payer: Self-pay | Admitting: Internal Medicine

## 2010-10-25 DIAGNOSIS — M79606 Pain in leg, unspecified: Secondary | ICD-10-CM

## 2010-10-25 NOTE — Progress Notes (Signed)
Pt states she is still taking Percocet for pain and the last time she had taken it was 3 days ago.

## 2010-10-25 NOTE — Telephone Encounter (Signed)
UDS done. Pt states she is still  taking Percocet for pain and the last time she took it was 3 days ago.

## 2010-10-26 LAB — DRUGS OF ABUSE SCREEN W/O ALC, ROUTINE URINE
Amphetamine Screen, Ur: NEGATIVE
Barbiturate Quant, Ur: NEGATIVE
Marijuana Metabolite: NEGATIVE
Methadone: NEGATIVE
Opiate Screen, Urine: NEGATIVE

## 2010-10-29 LAB — OPIATE, QUANTITATIVE, URINE
Oxycodone - Total: NEGATIVE NG/ML
Oxymorphone: NEGATIVE NG/ML

## 2010-11-17 LAB — GLUCOSE, CAPILLARY: Glucose-Capillary: 166 mg/dL — ABNORMAL HIGH (ref 70–99)

## 2010-11-21 ENCOUNTER — Other Ambulatory Visit: Payer: Self-pay | Admitting: *Deleted

## 2010-11-21 DIAGNOSIS — E1139 Type 2 diabetes mellitus with other diabetic ophthalmic complication: Secondary | ICD-10-CM

## 2010-11-21 DIAGNOSIS — E119 Type 2 diabetes mellitus without complications: Secondary | ICD-10-CM

## 2010-11-21 DIAGNOSIS — J45909 Unspecified asthma, uncomplicated: Secondary | ICD-10-CM

## 2010-11-21 DIAGNOSIS — E785 Hyperlipidemia, unspecified: Secondary | ICD-10-CM

## 2010-11-21 DIAGNOSIS — M79609 Pain in unspecified limb: Secondary | ICD-10-CM

## 2010-11-21 DIAGNOSIS — J302 Other seasonal allergic rhinitis: Secondary | ICD-10-CM

## 2010-11-21 DIAGNOSIS — I10 Essential (primary) hypertension: Secondary | ICD-10-CM

## 2010-11-21 DIAGNOSIS — F4321 Adjustment disorder with depressed mood: Secondary | ICD-10-CM

## 2010-11-21 DIAGNOSIS — M25561 Pain in right knee: Secondary | ICD-10-CM

## 2010-11-22 NOTE — Telephone Encounter (Signed)
Pt. has had 2 negative UDS for oxycodone.  One of the UDS is reasonable to be negative as Chelsea [name] states her last dose prior to the urine sample was 3 days prior.  The other negative UDS was approximately 24 hours and is harder to reconcile.  I spoke with Dr. Nicoletta Dress and she realizes only the later UDS is suspicious.  We decided not to refill this medication until she is seen in clinic by Dr. Nicoletta Dress as scheduled in November.

## 2010-11-24 ENCOUNTER — Telehealth: Payer: Self-pay | Admitting: *Deleted

## 2010-11-24 LAB — PROTEIN ELECTROPH W RFLX QUANT IMMUNOGLOBULINS
Albumin ELP: 31.9 — ABNORMAL LOW
Alpha-1-Globulin: 12.2 — ABNORMAL HIGH
Beta 2: 7.7 — ABNORMAL HIGH
Beta Globulin: 4 — ABNORMAL LOW
Gamma Globulin: 25.5 — ABNORMAL HIGH

## 2010-11-24 LAB — CBC
HCT: 23.3 — ABNORMAL LOW
HCT: 23.6 — ABNORMAL LOW
HCT: 29 — ABNORMAL LOW
HCT: 29.6 — ABNORMAL LOW
HCT: 31.5 — ABNORMAL LOW
HCT: 32.3 — ABNORMAL LOW
HCT: 35.6 — ABNORMAL LOW
HCT: 37
Hemoglobin: 10.2 — ABNORMAL LOW
Hemoglobin: 10.7 — ABNORMAL LOW
Hemoglobin: 10.9 — ABNORMAL LOW
Hemoglobin: 12
Hemoglobin: 7.9 — CL
Hemoglobin: 8.6 — ABNORMAL LOW
MCHC: 33.7
MCHC: 33.7
MCHC: 33.8
MCHC: 33.8
MCHC: 34.3
MCHC: 34.3
MCV: 86.6
MCV: 86.8
MCV: 87.7
MCV: 87.9
MCV: 87.9
MCV: 88.1
Platelets: 264
Platelets: 269
Platelets: 273
Platelets: 273
Platelets: 280
Platelets: 300
Platelets: 302
Platelets: 319
Platelets: 321
RBC: 2.69 — ABNORMAL LOW
RBC: 3.61 — ABNORMAL LOW
RBC: 3.69 — ABNORMAL LOW
RBC: 4.05
RDW: 13.2
RDW: 13.3
RDW: 13.7
RDW: 13.9
RDW: 14.3 — ABNORMAL HIGH
RDW: 14.5 — ABNORMAL HIGH
RDW: 14.5 — ABNORMAL HIGH
RDW: 14.5 — ABNORMAL HIGH
RDW: 14.5 — ABNORMAL HIGH
WBC: 12.4 — ABNORMAL HIGH
WBC: 15.4 — ABNORMAL HIGH
WBC: 18.1 — ABNORMAL HIGH
WBC: 20.8 — ABNORMAL HIGH
WBC: 24 — ABNORMAL HIGH
WBC: 24.7 — ABNORMAL HIGH
WBC: 25.8 — ABNORMAL HIGH
WBC: 28 — ABNORMAL HIGH

## 2010-11-24 LAB — BASIC METABOLIC PANEL
BUN: 17
BUN: 19
BUN: 19
BUN: 21
BUN: 23
BUN: 24 — ABNORMAL HIGH
BUN: 34 — ABNORMAL HIGH
BUN: 35 — ABNORMAL HIGH
BUN: 40 — ABNORMAL HIGH
BUN: 48 — ABNORMAL HIGH
BUN: 9
CO2: 12 — ABNORMAL LOW
CO2: 13 — ABNORMAL LOW
CO2: 16 — ABNORMAL LOW
CO2: 16 — ABNORMAL LOW
CO2: 18 — ABNORMAL LOW
CO2: 18 — ABNORMAL LOW
CO2: 22
Calcium: 6.9 — ABNORMAL LOW
Calcium: 7 — ABNORMAL LOW
Calcium: 7.6 — ABNORMAL LOW
Calcium: 7.8 — ABNORMAL LOW
Calcium: 7.9 — ABNORMAL LOW
Calcium: 7.9 — ABNORMAL LOW
Chloride: 106
Chloride: 106
Chloride: 107
Chloride: 107
Chloride: 108
Chloride: 109
Chloride: 110
Creatinine, Ser: 2.33 — ABNORMAL HIGH
Creatinine, Ser: 2.62 — ABNORMAL HIGH
Creatinine, Ser: 3.47 — ABNORMAL HIGH
Creatinine, Ser: 4.59 — ABNORMAL HIGH
Creatinine, Ser: 5.52 — ABNORMAL HIGH
Creatinine, Ser: 5.7 — ABNORMAL HIGH
Creatinine, Ser: 5.78 — ABNORMAL HIGH
GFR calc Af Amer: 12 — ABNORMAL LOW
GFR calc Af Amer: 23 — ABNORMAL LOW
GFR calc Af Amer: 26 — ABNORMAL LOW
GFR calc Af Amer: 9 — ABNORMAL LOW
GFR calc Af Amer: 9 — ABNORMAL LOW
GFR calc non Af Amer: 10 — ABNORMAL LOW
GFR calc non Af Amer: 14 — ABNORMAL LOW
GFR calc non Af Amer: 19 — ABNORMAL LOW
GFR calc non Af Amer: 22 — ABNORMAL LOW
GFR calc non Af Amer: 8 — ABNORMAL LOW
GFR calc non Af Amer: 8 — ABNORMAL LOW
GFR calc non Af Amer: 8 — ABNORMAL LOW
GFR calc non Af Amer: 8 — ABNORMAL LOW
Glucose, Bld: 109 — ABNORMAL HIGH
Glucose, Bld: 115 — ABNORMAL HIGH
Glucose, Bld: 133 — ABNORMAL HIGH
Glucose, Bld: 168 — ABNORMAL HIGH
Glucose, Bld: 174 — ABNORMAL HIGH
Glucose, Bld: 178 — ABNORMAL HIGH
Glucose, Bld: 191 — ABNORMAL HIGH
Glucose, Bld: 226 — ABNORMAL HIGH
Glucose, Bld: 234 — ABNORMAL HIGH
Glucose, Bld: 92
Potassium: 3.1 — ABNORMAL LOW
Potassium: 3.2 — ABNORMAL LOW
Potassium: 3.2 — ABNORMAL LOW
Potassium: 3.7
Potassium: 3.9
Potassium: 3.9
Potassium: 4
Potassium: 4
Potassium: 4.1
Potassium: 4.1
Sodium: 131 — ABNORMAL LOW
Sodium: 132 — ABNORMAL LOW
Sodium: 133 — ABNORMAL LOW
Sodium: 134 — ABNORMAL LOW
Sodium: 135
Sodium: 136
Sodium: 136
Sodium: 138

## 2010-11-24 LAB — I-STAT EC8
Acid-base deficit: 12 — ABNORMAL HIGH
Potassium: 3.4 — ABNORMAL LOW
TCO2: 15
pCO2 arterial: 33.4 — ABNORMAL LOW
pH, Arterial: 7.244 — ABNORMAL LOW

## 2010-11-24 LAB — ANTI-NEUTROPHIL ANTIBODY: Cytoplasmic Neutrophilic Ab: <1:20 {titer}

## 2010-11-24 LAB — CROSSMATCH

## 2010-11-24 LAB — POCT I-STAT 3, ART BLOOD GAS (G3+)
Acid-base deficit: 10 — ABNORMAL HIGH
Acid-base deficit: 11 — ABNORMAL HIGH
Bicarbonate: 14.2 — ABNORMAL LOW
O2 Saturation: 99
Operator id: 229971
TCO2: 15
pO2, Arterial: 135 — ABNORMAL HIGH
pO2, Arterial: 98

## 2010-11-24 LAB — URINALYSIS, ROUTINE W REFLEX MICROSCOPIC
Glucose, UA: 500 — AB
Glucose, UA: >1000 — AB
Ketones, ur: 15 — AB
Specific Gravity, Urine: 1.037 — ABNORMAL HIGH
pH: 5
pH: 5.5

## 2010-11-24 LAB — CULTURE, BLOOD (ROUTINE X 2)
Culture: NO GROWTH
Culture: NO GROWTH
Culture: NO GROWTH

## 2010-11-24 LAB — CULTURE, ROUTINE-ABSCESS

## 2010-11-24 LAB — URINE MICROSCOPIC-ADD ON

## 2010-11-24 LAB — URINE CULTURE: Culture: NO GROWTH

## 2010-11-24 LAB — BLOOD GAS, ARTERIAL
Acid-base deficit: 18.4 — ABNORMAL HIGH
Bicarbonate: 9.1 — ABNORMAL LOW
FIO2: 0.21
FIO2: 0.21
O2 Saturation: 95.9
Patient temperature: 98.6
TCO2: 9.8
pH, Arterial: 7.213 — ABNORMAL LOW
pO2, Arterial: 75.6 — ABNORMAL LOW
pO2, Arterial: 92.3

## 2010-11-24 LAB — COMPREHENSIVE METABOLIC PANEL
AST: 10
AST: 19
Albumin: 2.7 — ABNORMAL LOW
Alkaline Phosphatase: 144 — ABNORMAL HIGH
BUN: 43 — ABNORMAL HIGH
CO2: 13 — ABNORMAL LOW
Calcium: 7 — ABNORMAL LOW
Chloride: 112
Chloride: 95 — ABNORMAL LOW
Creatinine, Ser: 0.96
Creatinine, Ser: 6.04 — ABNORMAL HIGH
GFR calc Af Amer: 9 — ABNORMAL LOW
GFR calc Af Amer: >60
GFR calc non Af Amer: 7 — ABNORMAL LOW
GFR calc non Af Amer: 7 — ABNORMAL LOW
Glucose, Bld: 137 — ABNORMAL HIGH
Glucose, Bld: 165 — ABNORMAL HIGH
Potassium: 3.6
Potassium: 4.2
Total Bilirubin: 0.2 — ABNORMAL LOW
Total Bilirubin: 1
Total Protein: 7.9

## 2010-11-24 LAB — DIFFERENTIAL
Basophils Absolute: 0
Basophils Relative: 0
Basophils Relative: 0
Eosinophils Absolute: 0
Eosinophils Absolute: 0.2
Eosinophils Absolute: 0.3
Eosinophils Relative: 0
Eosinophils Relative: 1
Eosinophils Relative: 1
Lymphocytes Relative: 10 — ABNORMAL LOW
Lymphocytes Relative: 9 — ABNORMAL LOW
Lymphs Abs: 2.5
Monocytes Absolute: 0.6
Monocytes Relative: 2 — ABNORMAL LOW
Neutro Abs: 22 — ABNORMAL HIGH
Neutrophils Relative %: 88 — ABNORMAL HIGH

## 2010-11-24 LAB — WOUND CULTURE

## 2010-11-24 LAB — IGG, IGA, IGM
IgG (Immunoglobin G), Serum: 2080 — ABNORMAL HIGH
IgM, Serum: 139

## 2010-11-24 LAB — ANAEROBIC CULTURE

## 2010-11-24 LAB — CLOSTRIDIUM DIFFICILE EIA: C difficile Toxins A+B, EIA: NEGATIVE

## 2010-11-24 LAB — VANCOMYCIN, RANDOM
Vancomycin Rm: 32.6
Vancomycin Rm: 35.2
Vancomycin Rm: 46.5

## 2010-11-24 LAB — LACTIC ACID, PLASMA: Lactic Acid, Venous: 0.8

## 2010-11-24 LAB — HIV ANTIBODY (ROUTINE TESTING W REFLEX): HIV: NONREACTIVE

## 2010-11-24 LAB — RPR: RPR Ser Ql: NONREACTIVE

## 2010-11-24 LAB — GC/CHLAMYDIA PROBE AMP, URINE: GC Probe Amp, Urine: NEGATIVE

## 2010-11-24 LAB — IMMUNOFIXATION ADD-ON

## 2010-11-24 LAB — CREATININE, URINE, RANDOM: Creatinine, Urine: 30.9

## 2010-11-24 NOTE — Telephone Encounter (Signed)
Pt had called about picking up Percocet rx. I explained to pt UDS results per Dr. Caroline More previous note - she said okay. And to keep her appt in Nov w/Dr. Nicoletta Dress to discuss this issue further - she said okay.

## 2011-01-09 ENCOUNTER — Telehealth: Payer: Self-pay | Admitting: Licensed Clinical Social Worker

## 2011-01-09 ENCOUNTER — Encounter: Payer: Self-pay | Admitting: Licensed Clinical Social Worker

## 2011-01-09 NOTE — Telephone Encounter (Signed)
Called Chelsea [name] to let her know FL-2 was completed and signed and ready for p/u.  She will p/u by Friday.

## 2011-01-11 ENCOUNTER — Encounter: Payer: Self-pay | Admitting: Internal Medicine

## 2011-01-11 ENCOUNTER — Ambulatory Visit (INDEPENDENT_AMBULATORY_CARE_PROVIDER_SITE_OTHER): Payer: Medicaid Other | Admitting: Internal Medicine

## 2011-01-11 ENCOUNTER — Telehealth: Payer: Self-pay | Admitting: *Deleted

## 2011-01-11 VITALS — BP 140/80 | HR 64 | Temp 97.0°F | Ht 67.0 in | Wt 231.9 lb

## 2011-01-11 DIAGNOSIS — E1139 Type 2 diabetes mellitus with other diabetic ophthalmic complication: Secondary | ICD-10-CM

## 2011-01-11 DIAGNOSIS — I10 Essential (primary) hypertension: Secondary | ICD-10-CM

## 2011-01-11 DIAGNOSIS — M25569 Pain in unspecified knee: Secondary | ICD-10-CM

## 2011-01-11 DIAGNOSIS — M79609 Pain in unspecified limb: Secondary | ICD-10-CM

## 2011-01-11 DIAGNOSIS — K219 Gastro-esophageal reflux disease without esophagitis: Secondary | ICD-10-CM | POA: Insufficient documentation

## 2011-01-11 DIAGNOSIS — Z0000 Encounter for general adult medical examination without abnormal findings: Secondary | ICD-10-CM

## 2011-01-11 DIAGNOSIS — E119 Type 2 diabetes mellitus without complications: Secondary | ICD-10-CM

## 2011-01-11 DIAGNOSIS — J302 Other seasonal allergic rhinitis: Secondary | ICD-10-CM

## 2011-01-11 DIAGNOSIS — J45909 Unspecified asthma, uncomplicated: Secondary | ICD-10-CM

## 2011-01-11 DIAGNOSIS — E785 Hyperlipidemia, unspecified: Secondary | ICD-10-CM

## 2011-01-11 DIAGNOSIS — M25561 Pain in right knee: Secondary | ICD-10-CM

## 2011-01-11 DIAGNOSIS — F4321 Adjustment disorder with depressed mood: Secondary | ICD-10-CM

## 2011-01-11 DIAGNOSIS — J309 Allergic rhinitis, unspecified: Secondary | ICD-10-CM

## 2011-01-11 LAB — GLUCOSE, CAPILLARY: Glucose-Capillary: 91 mg/dL (ref 70–99)

## 2011-01-11 LAB — POCT GLYCOSYLATED HEMOGLOBIN (HGB A1C): Hemoglobin A1C: 7.2

## 2011-01-11 MED ORDER — OXYCODONE-ACETAMINOPHEN 5-325 MG PO TABS: 1.0000 | ORAL_TABLET | Freq: Four times a day (QID) | ORAL | Status: DC | PRN

## 2011-01-11 MED ORDER — HYDROCODONE-ACETAMINOPHEN 5-500 MG PO CAPS: 1.0000 | ORAL_CAPSULE | Freq: Four times a day (QID) | ORAL | Status: AC | PRN

## 2011-01-11 MED ORDER — PANTOPRAZOLE SODIUM 40 MG PO TBEC: 40.0000 mg | DELAYED_RELEASE_TABLET | Freq: Every day | ORAL | Status: DC

## 2011-01-11 NOTE — Assessment & Plan Note (Signed)
Pt's SBP is 170  initially and back to 140 when repeating BP. -instructed to check her BP twice daily at home and bring the records next visit in 2 weeks - limit salt intake -if her BP consistently elevated, may consider stop Beta-blocker and add CCB.

## 2011-01-11 NOTE — Assessment & Plan Note (Signed)
The clinical manifestation of her cough is consistent with GERD. - will prescribe Protonix. - Patient is instructed to avoid large meals, cut bedtime snacks and sleep with elevation of her bed.
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
## 2011-01-11 NOTE — Patient Instructions (Addendum)
1. Sleep with elevation of head 2. Avoid large meals, cut all of your bedtime snacks. 3. I will give your Vicodin for your right knee pain, 4. Please take your Blood pressure twice a day and record.  5 limit salt intake 6. Follow up in 2 weeks.

## 2011-01-11 NOTE — Telephone Encounter (Signed)
Called [phone] Crestor was denied. Pt needs to try lovastatin,pravastain, simvastatin or atorvastatin for 30 days. Has appt today with Dr Nicoletta Dress. Hilda Blades Jacolby Risby RN 01/11/11 10:30AM

## 2011-01-11 NOTE — Assessment & Plan Note (Signed)
HB A1C is 7.2 today. Pt is update on all her DM management.

## 2011-01-12 ENCOUNTER — Other Ambulatory Visit: Payer: Self-pay | Admitting: Internal Medicine

## 2011-01-12 MED ORDER — PRAVASTATIN SODIUM 40 MG PO TABS: 40.0000 mg | ORAL_TABLET | Freq: Every evening | ORAL | Status: DC

## 2011-01-12 NOTE — Progress Notes (Signed)
agree with plan and notes

## 2011-01-16 ENCOUNTER — Other Ambulatory Visit: Payer: Self-pay | Admitting: *Deleted

## 2011-01-16 DIAGNOSIS — F4320 Adjustment disorder, unspecified: Secondary | ICD-10-CM

## 2011-01-16 MED ORDER — FLUOXETINE HCL 20 MG PO CAPS: 20.0000 mg | ORAL_CAPSULE | Freq: Every day | ORAL | Status: DC

## 2011-01-16 NOTE — Telephone Encounter (Signed)
Pt has been out x 2 days.  Sander Nephew, RN 01/16/2011 1:53 PM

## 2011-01-26 ENCOUNTER — Ambulatory Visit (INDEPENDENT_AMBULATORY_CARE_PROVIDER_SITE_OTHER): Payer: Medicaid Other | Admitting: Internal Medicine

## 2011-01-26 ENCOUNTER — Encounter: Payer: Self-pay | Admitting: Internal Medicine

## 2011-01-26 VITALS — BP 168/66 | HR 64 | Temp 97.5°F | Ht 66.0 in | Wt 228.1 lb

## 2011-01-26 DIAGNOSIS — E119 Type 2 diabetes mellitus without complications: Secondary | ICD-10-CM

## 2011-01-26 DIAGNOSIS — I10 Essential (primary) hypertension: Secondary | ICD-10-CM

## 2011-01-26 LAB — BASIC METABOLIC PANEL
BUN: 19 mg/dL (ref 6–23)
Calcium: 9.3 mg/dL (ref 8.4–10.5)
Creat: 0.89 mg/dL (ref 0.50–1.10)

## 2011-01-26 LAB — GLUCOSE, CAPILLARY: Glucose-Capillary: 101 mg/dL — ABNORMAL HIGH (ref 70–99)

## 2011-01-26 MED ORDER — AMLODIPINE BESYLATE 5 MG PO TABS: 5.0000 mg | ORAL_TABLET | Freq: Every day | ORAL | Status: DC

## 2011-01-26 NOTE — Assessment & Plan Note (Signed)
Well controlled, no change made today to her regimen.

## 2011-01-26 NOTE — Progress Notes (Signed)
Subjective:     [name], female   DOB: 1951-01-31, 60 y.o.   MRN: [id number]  HPI  Patient is a 60 year old female with a PMH listed below, presents to the [facility] for BP followup. Denies any new complaints today. Reports being compliant to her meds.  Patient Active Problem List  Diagnoses  . FIBROIDS, UTERUS  . DIABETES MELLITUS, TYPE II  . DIABETIC  RETINOPATHY  . HYPERLIPIDEMIA  . DELIRIUM, ALCOHOL WITHDRAWAL  . ABUSE, ALCOHOL, IN REMISSION  . CATARACT NOS  . HYPERTENSION  . ALLERGIC RHINITIS  . ASTHMA  . FASCIITIS, NECROTIZING  . LEG PAIN, RIGHT  . INSOMNIA  . PERSONAL HISTORY OF COLONIC POLYPS  . RENAL FAILURE, ACUTE, HX OF  . GRIEF REACTION, ACUTE  . Unspecified adjustment reaction  . Healthcare maintenance  . Diabetic foot  . GERD (gastroesophageal reflux disease)   Current Outpatient Prescriptions on File Prior to Visit  Medication Sig Dispense Refill  . albuterol (VENTOLIN HFA) 108 (90 BASE) MCG/ACT inhaler Inhale 2 puffs into the lungs every 6 (six) hours as needed for shortness of breath.  1 Inhaler  2  . aspirin 81 MG EC tablet Take 81 mg by mouth daily.        [name] Kitchen atenolol (TENORMIN) 100 MG tablet TAKE ONE TABLET BY MOUTH EVERY DAY  30 tablet  4  . Blood Glucose Monitoring Suppl KIT by Does not apply route.        [name] Kitchen FLUoxetine (PROZAC) 20 MG capsule Take 1 capsule (20 mg total) by mouth daily.  30 capsule  6  . fluticasone (FLONASE) 50 MCG/ACT nasal spray Place 2 sprays into the nose 2 (two) times daily.  16 g  2  . glucose blood (ACCU-CHEK AVIVA) test strip 1 each by Other route daily. Use as instructed  100 each  11  . glucose blood test strip 1 each by Other route as needed. Use as instructed       . hydrochlorothiazide 25 MG tablet Take 25 mg by mouth daily.        . Insulin Pen Needle 31G X 8 MM MISC by Does not apply route.        Elmore Guise Device MISC by Does not apply route.        [name] Kitchen lisinopril (PRINIVIL,ZESTRIL) 20 MG tablet Take 2 tablets  (40 mg total) by mouth daily.  60 tablet  11  . loratadine (CLARITIN) 10 MG tablet Take 1 tablet (10 mg total) by mouth daily.  30 tablet  5  . metFORMIN (GLUCOPHAGE) 1000 MG tablet Take 1 tablet (1,000 mg total) by mouth 2 (two) times daily with a meal.  60 tablet  11  . NOVOLOG MIX 70/30 FLEXPEN (70-30) 100 UNIT/ML injection INJECT 24 UNITS BEFORE BREAKFAST AND 19 UNITS BEFORE EVENING MEALS  15 mL  7  . oxyCODONE-acetaminophen (PERCOCET) 5-325 MG per tablet Take 1 tablet by mouth every 6 (six) hours as needed for Pain.  90 tablet  0  . oxyCODONE-acetaminophen (PERCOCET) 5-325 MG per tablet Take 1 tablet by mouth every 6 (six) hours as needed for pain.  90 tablet  0  . oxyCODONE-acetaminophen (PERCOCET) 5-325 MG per tablet Take 1 tablet by mouth every 6 (six) hours as needed for pain.  90 tablet  0  . pantoprazole (PROTONIX) 40 MG tablet Take 1 tablet (40 mg total) by mouth daily.  30 tablet  11  . pravastatin (PRAVACHOL) 40 MG tablet  Take 1 tablet (40 mg total) by mouth every evening.  30 tablet  0   Allergies  Allergen Reactions  . Nsaids Nausea And Vomiting  . Penicillins      Review of Systems  All other systems reviewed and are negative.       Objective:   Physical Exam  Nursing note and vitals reviewed. Constitutional: She is oriented to person, place, and time. She appears well-developed and well-nourished.  HENT:  Head: Normocephalic and atraumatic.  Eyes: Pupils are equal, round, and reactive to light.  Neck: Normal range of motion. Neck supple. No JVD present. No thyromegaly present.  Cardiovascular: Normal rate, regular rhythm and normal heart sounds.   No murmur heard. Pulmonary/Chest: Effort normal and breath sounds normal. She has no wheezes. She has no rales.  Abdominal: Soft. Bowel sounds are normal.  Musculoskeletal: Normal range of motion. She exhibits no edema.  Neurological: She is alert and oriented to person, place, and time.  Skin: Skin is warm and dry.

## 2011-01-26 NOTE — Assessment & Plan Note (Signed)
Patients BP is not well controlled despite being on Lisinopril 40, HCTZ 25 and Atenolol 100. Given this will add Amlodipine 5mg  daily to her regiment and recheck BP in 1 month. Will also check BMET today.

## 2011-01-26 NOTE — Patient Instructions (Signed)
Please start taking amlodipine 5mg  daily for blood pressure

## 2011-01-29 ENCOUNTER — Telehealth: Payer: Self-pay | Admitting: *Deleted

## 2011-01-29 MED ORDER — BLADDER CONTROL PADS REGULAR MISC: Status: DC

## 2011-01-29 NOTE — Telephone Encounter (Signed)
Call from case manager with [facility] asking for a written Rx for " one bladder control pad per day" # 30 Fax to [phone]

## 2011-01-29 NOTE — Telephone Encounter (Signed)
Rx faxed in.

## 2011-02-14 ENCOUNTER — Other Ambulatory Visit: Payer: Self-pay | Admitting: Family Medicine

## 2011-02-25 ENCOUNTER — Telehealth: Payer: Self-pay | Admitting: Internal Medicine

## 2011-02-25 NOTE — Telephone Encounter (Signed)
Pt called at 8 45 am 02/24/10 as was feeling dizzy this morning and was concerned about a fall. Denies any fever, N/V, diarrhea, LOC, fall. She does have a cane which she uses. She says is not drinking much water for few days. She checked her BP - 125/56. She has DM and last A1C was 7.2. Last BMP was WNL.  Recommended her to drink plenty of water and change positions from sitting to standing slowly. Also encouraged to keep her appt on 02/27/11 with Dr. Orvilla Fus.  She was agreeable with the plan.

## 2011-02-26 ENCOUNTER — Other Ambulatory Visit: Payer: Self-pay | Admitting: *Deleted

## 2011-02-26 MED ORDER — PRAVASTATIN SODIUM 40 MG PO TABS: 40.0000 mg | ORAL_TABLET | Freq: Every evening | ORAL | Status: DC

## 2011-02-27 ENCOUNTER — Encounter: Payer: Self-pay | Admitting: Internal Medicine

## 2011-02-27 ENCOUNTER — Other Ambulatory Visit: Payer: Self-pay | Admitting: Internal Medicine

## 2011-02-27 ENCOUNTER — Ambulatory Visit (INDEPENDENT_AMBULATORY_CARE_PROVIDER_SITE_OTHER): Payer: Medicaid Other | Admitting: Internal Medicine

## 2011-02-27 DIAGNOSIS — E785 Hyperlipidemia, unspecified: Secondary | ICD-10-CM

## 2011-02-27 DIAGNOSIS — I10 Essential (primary) hypertension: Secondary | ICD-10-CM

## 2011-02-27 DIAGNOSIS — E1139 Type 2 diabetes mellitus with other diabetic ophthalmic complication: Secondary | ICD-10-CM

## 2011-02-27 DIAGNOSIS — M25569 Pain in unspecified knee: Secondary | ICD-10-CM

## 2011-02-27 DIAGNOSIS — J302 Other seasonal allergic rhinitis: Secondary | ICD-10-CM

## 2011-02-27 DIAGNOSIS — F1011 Alcohol abuse, in remission: Secondary | ICD-10-CM

## 2011-02-27 DIAGNOSIS — J45909 Unspecified asthma, uncomplicated: Secondary | ICD-10-CM

## 2011-02-27 DIAGNOSIS — M25561 Pain in right knee: Secondary | ICD-10-CM

## 2011-02-27 DIAGNOSIS — M79609 Pain in unspecified limb: Secondary | ICD-10-CM

## 2011-02-27 DIAGNOSIS — J309 Allergic rhinitis, unspecified: Secondary | ICD-10-CM

## 2011-02-27 DIAGNOSIS — E119 Type 2 diabetes mellitus without complications: Secondary | ICD-10-CM

## 2011-02-27 DIAGNOSIS — F4321 Adjustment disorder with depressed mood: Secondary | ICD-10-CM

## 2011-02-27 LAB — GLUCOSE, CAPILLARY: Glucose-Capillary: 170 mg/dL — ABNORMAL HIGH (ref 70–99)

## 2011-02-27 MED ORDER — FLUTICASONE PROPIONATE 50 MCG/ACT NA SUSP: NASAL | Status: DC

## 2011-02-27 NOTE — Patient Instructions (Signed)
Return to clinic in 2 months for a diabetes and blood pressure check

## 2011-03-01 ENCOUNTER — Encounter: Payer: Self-pay | Admitting: Internal Medicine

## 2011-03-01 NOTE — Assessment & Plan Note (Signed)
PCP refilled pravastatin yesterday.  Patient had been out of this medication for a little while
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
## 2011-03-01 NOTE — Assessment & Plan Note (Addendum)
Sugars 130s-160s at home per patient (AM sugars) on current regimen of 24 units qam and 19 units qpm of 70/70 insulin and metformin.  No change for now.  Last HbA1c 7.2% was good.  Return in two months for HbA1c recheck.  Patient reports going to [facility] for exam in past year and declines pneumovax.  Declines flu shot as well.

## 2011-03-01 NOTE — Assessment & Plan Note (Signed)
Reports she is taking all 4 medications as directed.  BP on recheck borderline high, but much improved from last visit. No regimen change for now.  Recheck in 2 months

## 2011-03-01 NOTE — Assessment & Plan Note (Signed)
"  Quit years ago" per patient

## 2011-03-01 NOTE — Assessment & Plan Note (Signed)
Changed loratadine to Flonase to help with BP control and per patient request

## 2011-03-02 ENCOUNTER — Other Ambulatory Visit: Payer: Self-pay | Admitting: Internal Medicine

## 2011-03-02 NOTE — Telephone Encounter (Signed)
Refill sent, Please let pt know

## 2011-03-27 ENCOUNTER — Other Ambulatory Visit: Payer: Self-pay | Admitting: Internal Medicine

## 2011-03-27 DIAGNOSIS — Z1231 Encounter for screening mammogram for malignant neoplasm of breast: Secondary | ICD-10-CM

## 2011-04-12 ENCOUNTER — Ambulatory Visit (INDEPENDENT_AMBULATORY_CARE_PROVIDER_SITE_OTHER): Payer: Medicaid Other | Admitting: Internal Medicine

## 2011-04-12 ENCOUNTER — Encounter: Payer: Self-pay | Admitting: Internal Medicine

## 2011-04-12 VITALS — BP 174/73 | HR 69 | Temp 97.1°F | Ht 66.0 in | Wt 230.8 lb

## 2011-04-12 DIAGNOSIS — R05 Cough: Secondary | ICD-10-CM

## 2011-04-12 DIAGNOSIS — I10 Essential (primary) hypertension: Secondary | ICD-10-CM

## 2011-04-12 DIAGNOSIS — E785 Hyperlipidemia, unspecified: Secondary | ICD-10-CM

## 2011-04-12 DIAGNOSIS — E119 Type 2 diabetes mellitus without complications: Secondary | ICD-10-CM

## 2011-04-12 DIAGNOSIS — J309 Allergic rhinitis, unspecified: Secondary | ICD-10-CM

## 2011-04-12 DIAGNOSIS — R059 Cough, unspecified: Secondary | ICD-10-CM | POA: Insufficient documentation

## 2011-04-12 MED ORDER — LORATADINE-PSEUDOEPHEDRINE ER 10-240 MG PO TB24: 1.0000 | ORAL_TABLET | Freq: Every day | ORAL | Status: DC

## 2011-04-12 MED ORDER — LOSARTAN POTASSIUM-HCTZ 100-25 MG PO TABS: 1.0000 | ORAL_TABLET | Freq: Every day | ORAL | Status: DC

## 2011-04-12 NOTE — Assessment & Plan Note (Addendum)
LDL is not at target which should be below 70 in a diabetic patient. -Will need to check lipid panel next office visit.  If her LDL is still elevated, I would change her to atorvastatin for better control. -Continue Pravachol 40mg  qd for now

## 2011-04-12 NOTE — Assessment & Plan Note (Signed)
Her symptoms are consistent with allergy instead of infection therefore, antibiotics not indicated.  Nose exam did show erythema of the nasal mucosa with clear drainage.  Lung exam clear to auscultation, no wheezes or crackles. -Will continue Flonase nasal spray -Start Claritin/pseudoephedrine 10/240mg  one tablet daily x 2 weeks for nasal congestion -Follow up with me in 2 weeks

## 2011-04-12 NOTE — Assessment & Plan Note (Signed)
Prolonged dry cough x 3 months in duration.  It is correlated with the timing of after taking Lisinopril; therefore, I think that this dry cough could be 2/2 ACEi.  Even though cough due to reflux is very common, she has tried 3 months of PPI without any relief and does not have any burning sensation or any other symptoms consistent with GERD at this time.  Other etiologies include infection, allergy, malignancy.  Malignancy is unlikely as patient denies any recent weight loss, fever, night sweat, or SOB. -Will stop PPI -Stop Lisinopril -Get chest Xray  -Control her allergy symptoms with Claritin/sudafed and flonase  -See back in 2 weeks to re-evaluate.   -If no improvement, may need to get CT of chest for further evaluation.  Case discussed with attending Dr. Stann Mainland

## 2011-04-12 NOTE — Assessment & Plan Note (Signed)
Poorly controlled.  This could be due to stress as well.  Given her dry cough which could be caused by ACEi, I will stop her ACEi today and substitute it with an ARB combine with HCTZ so that she does not have to take multiple pills.   -Continue Norvasc 5mg .  If her BP continues to be elevated next office visit, may increase to 10mg  -Stop hctz 25mg  & lisiniopril 20mg  -Start Losartan-hctz 100/25mg  poqd -Repeat BP in 2 weeks

## 2011-04-12 NOTE — Progress Notes (Signed)
HPI:  Chelsea [name] is a 61 yo W with PMH of DM II, HTN, asthma presents today for dry cough in past 3 months which keeps her up at night.  She tried PPI x 3 months but continues to have cough and this appears to happened after she started taking Lisinopril on further questioning.  She also has symptoms of allergy as well including itching and watery eyes and runny nose with clear drainage and post nasal drip.  She denies any purulent sputum, or fever, chills, or weight loss, SOB, or chest pain. She is going through grief right now because she lost her mother in December and her brother in January.  Going through a support group which really helps.  She thinks that the stress is causing her BP to be elevated. Otherwise she is doing well.  ROS: cough  PE: General: alert, well-developed, and cooperative to examination.  Nose: erythematous nasal mucosa with clear drainage, TM clear.  Neck: supple, full ROM, no thyromegaly, no JVD, and no carotid bruits.  Lungs: normal respiratory effort, no accessory muscle use, normal breath sounds, no crackles, and no wheezes. Heart: normal rate, regular rhythm, no murmur, no gallop, and no rub.  Abdomen: soft, non-tender, normal bowel sounds, no distention, no guarding, no rebound tenderness, Pulses: 2+ DP/PT pulses bilaterally Extremities: No cyanosis, clubbing, edema Neurologic: alert & oriented X3, cranial nerves II-XII intact, strength normal in all extremities, sensation intact to light touch, and gait normal.  Skin: turgor normal and no rashes.  Psych: Oriented X3, memory intact for recent and remote, normally interactive, good eye contact,slightly anxious appearing, and slightly depressed appearing.

## 2011-04-12 NOTE — Patient Instructions (Signed)
Stopped taking lisinopril & hydrochlorothiazide Start taking Losartan-hydrochlothiazide 100/25mg  one tablet daily Start taking Claritin/pseudoephedrine 10/240mg  one tablet daily x2 weeks Get chest Xray when you are available Follow up with Dr Silverio Decamp in 2 weeks

## 2011-04-12 NOTE — Assessment & Plan Note (Signed)
Well controlled, will continue current regimen -Continue to diet and exercise

## 2011-04-13 ENCOUNTER — Ambulatory Visit (HOSPITAL_COMMUNITY)
Admission: RE | Admit: 2011-04-13 | Discharge: 2011-04-13 | Disposition: A | Discharge status: Home / Self Care | Payer: Medicaid Other | Source: Ambulatory Visit | Attending: Internal Medicine | Admitting: Internal Medicine

## 2011-04-13 DIAGNOSIS — I517 Cardiomegaly: Secondary | ICD-10-CM | POA: Insufficient documentation

## 2011-04-13 DIAGNOSIS — R05 Cough: Secondary | ICD-10-CM

## 2011-04-13 DIAGNOSIS — R059 Cough, unspecified: Secondary | ICD-10-CM | POA: Insufficient documentation

## 2011-04-20 ENCOUNTER — Ambulatory Visit (HOSPITAL_COMMUNITY)
Admission: RE | Admit: 2011-04-20 | Discharge: 2011-04-20 | Disposition: A | Discharge status: Home / Self Care | Payer: Medicaid Other | Source: Ambulatory Visit | Attending: Internal Medicine | Admitting: Internal Medicine

## 2011-04-20 DIAGNOSIS — Z1231 Encounter for screening mammogram for malignant neoplasm of breast: Secondary | ICD-10-CM | POA: Insufficient documentation

## 2011-04-23 ENCOUNTER — Other Ambulatory Visit: Payer: Self-pay | Admitting: *Deleted

## 2011-04-23 NOTE — Telephone Encounter (Signed)
Please include instructions/how often with each request.  Thanks

## 2011-04-24 MED ORDER — INSULIN PEN NEEDLE 31G X 8 MM MISC: 1.0000 | Freq: Three times a day (TID) | Status: DC

## 2011-04-24 MED ORDER — LANCET DEVICE MISC: 1.0000 | Freq: Three times a day (TID) | Status: DC

## 2011-05-03 ENCOUNTER — Encounter: Payer: Self-pay | Admitting: Internal Medicine

## 2011-05-03 ENCOUNTER — Ambulatory Visit (INDEPENDENT_AMBULATORY_CARE_PROVIDER_SITE_OTHER): Payer: Medicaid Other | Admitting: Internal Medicine

## 2011-05-03 VITALS — BP 140/60 | HR 94 | Temp 97.6°F | Ht 66.0 in | Wt 231.6 lb

## 2011-05-03 DIAGNOSIS — I10 Essential (primary) hypertension: Secondary | ICD-10-CM

## 2011-05-03 DIAGNOSIS — E119 Type 2 diabetes mellitus without complications: Secondary | ICD-10-CM

## 2011-05-03 DIAGNOSIS — R0989 Other specified symptoms and signs involving the circulatory and respiratory systems: Secondary | ICD-10-CM | POA: Insufficient documentation

## 2011-05-03 DIAGNOSIS — Z79899 Other long term (current) drug therapy: Secondary | ICD-10-CM

## 2011-05-03 LAB — POCT GLYCOSYLATED HEMOGLOBIN (HGB A1C): Hemoglobin A1C: 7.3

## 2011-05-03 MED ORDER — INSULIN PEN NEEDLE 31G X 8 MM MISC: 1.0000 | Freq: Three times a day (TID) | Status: DC

## 2011-05-03 MED ORDER — HYDROCODONE-ACETAMINOPHEN 5-325 MG PO TABS: 1.0000 | ORAL_TABLET | Freq: Four times a day (QID) | ORAL | Status: AC | PRN
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
## 2011-05-03 NOTE — Assessment & Plan Note (Addendum)
Patient is on Norvasc and Losartan-HCTZ. (lisinopril and HCTZ D/C'd). She does not have portable BP monitor at home.  - Goal for BP is < 140/90 for now. - patient is instructed to check her BP twice day and bring her log to the clinic - will not change her medications.

## 2011-05-03 NOTE — Assessment & Plan Note (Addendum)
Patient has been compliant with her DM management. Hb A1C is 7.3 today  - continue the current therapy. - Goal is < 7.0 - check her Urine microalbumin

## 2011-05-03 NOTE — Patient Instructions (Addendum)
1. Eat healthy food and exercise. 2. Continue your medications 3. Check your BP twice a day and record ina log 4. Make sure you go see your foot doctor 5. Lose 2 lbs before you have your next visit. 6. You will be notified of appt for carotid doppler

## 2011-05-03 NOTE — Assessment & Plan Note (Signed)
Right side carotid bruit noted. Patient has been symptomatic, no Hx of TIA or stroke. Neuro assessment negative.  - carotid doppler.

## 2011-05-04 LAB — MICROALBUMIN / CREATININE URINE RATIO: Microalb Creat Ratio: 165.9 mg/g — ABNORMAL HIGH (ref 0.0–30.0)

## 2011-05-15 ENCOUNTER — Ambulatory Visit (HOSPITAL_COMMUNITY)
Admission: RE | Admit: 2011-05-15 | Discharge: 2011-05-15 | Disposition: A | Discharge status: Home / Self Care | Payer: Medicaid Other | Source: Ambulatory Visit | Attending: Internal Medicine | Admitting: Internal Medicine

## 2011-05-15 DIAGNOSIS — R0989 Other specified symptoms and signs involving the circulatory and respiratory systems: Secondary | ICD-10-CM

## 2011-05-15 NOTE — Progress Notes (Signed)
VASCULAR LAB PRELIMINARY  PRELIMINARY  PRELIMINARY  PRELIMINARY  Carotid duplex completed.    Preliminary report: No significant ICA stenosis noted bilaterally.  Vertebral artery flow antegrade.  Margarette Canada,   RVT 05/15/2011, 10:25 AM

## 2011-06-15 ENCOUNTER — Other Ambulatory Visit: Payer: Self-pay | Admitting: *Deleted

## 2011-06-18 MED ORDER — PRAVASTATIN SODIUM 40 MG PO TABS: 40.0000 mg | ORAL_TABLET | Freq: Every evening | ORAL | Status: DC

## 2011-06-19 ENCOUNTER — Other Ambulatory Visit: Payer: Self-pay | Admitting: *Deleted

## 2011-06-19 NOTE — Telephone Encounter (Signed)
States she is out of medication.

## 2011-06-20 MED ORDER — ATENOLOL 100 MG PO TABS: 100.0000 mg | ORAL_TABLET | Freq: Every day | ORAL | Status: DC

## 2011-08-09 ENCOUNTER — Ambulatory Visit (INDEPENDENT_AMBULATORY_CARE_PROVIDER_SITE_OTHER): Payer: Medicaid Other | Admitting: Internal Medicine

## 2011-08-09 ENCOUNTER — Encounter: Payer: Self-pay | Admitting: Internal Medicine

## 2011-08-09 VITALS — BP 153/66 | HR 64 | Temp 98.1°F | Ht 64.0 in | Wt 226.1 lb

## 2011-08-09 DIAGNOSIS — E119 Type 2 diabetes mellitus without complications: Secondary | ICD-10-CM

## 2011-08-09 DIAGNOSIS — Z79899 Other long term (current) drug therapy: Secondary | ICD-10-CM

## 2011-08-09 DIAGNOSIS — I10 Essential (primary) hypertension: Secondary | ICD-10-CM

## 2011-08-09 DIAGNOSIS — R0989 Other specified symptoms and signs involving the circulatory and respiratory systems: Secondary | ICD-10-CM

## 2011-08-09 DIAGNOSIS — E785 Hyperlipidemia, unspecified: Secondary | ICD-10-CM

## 2011-08-09 DIAGNOSIS — M79609 Pain in unspecified limb: Secondary | ICD-10-CM

## 2011-08-09 LAB — CBC
HCT: 36.5 % (ref 36.0–46.0)
Hemoglobin: 11.9 g/dL — ABNORMAL LOW (ref 12.0–15.0)
RBC: 4.11 MIL/uL (ref 3.87–5.11)
WBC: 6.6 10*3/uL (ref 4.0–10.5)

## 2011-08-09 LAB — POCT GLYCOSYLATED HEMOGLOBIN (HGB A1C): Hemoglobin A1C: 6.6

## 2011-08-09 LAB — LIPID PANEL: LDL Cholesterol: 119 mg/dL — ABNORMAL HIGH (ref 0–99)

## 2011-08-09 MED ORDER — HYDROCODONE-ACETAMINOPHEN 5-325 MG PO TABS: 1.0000 | ORAL_TABLET | Freq: Four times a day (QID) | ORAL | Status: DC | PRN

## 2011-08-09 NOTE — Progress Notes (Signed)
CBG:66  Treatment: 15 GM carbohydrate snack   Symptoms: None     Follow-up CBG: Time : 1405 CBG Result:73  Possible Reasons for Event: Inadequate meal intake    Comments/MD notified:Yes    Sande Pickert, [employer]
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
## 2011-08-09 NOTE — Patient Instructions (Addendum)
1. Follow up with me in 3 months 2. Continue healthy lifestyle.

## 2011-08-10 LAB — BASIC METABOLIC PANEL WITH GFR
BUN: 25 mg/dL — ABNORMAL HIGH (ref 6–23)
Calcium: 9.1 mg/dL (ref 8.4–10.5)
GFR, Est African American: 80 mL/min
Glucose, Bld: 86 mg/dL (ref 70–99)
Sodium: 138 mEq/L (ref 135–145)

## 2011-08-12 NOTE — Assessment & Plan Note (Signed)
Carotid doppler:Negative

## 2011-08-12 NOTE — Assessment & Plan Note (Signed)
HGB A1C 6.6 today. Better DM control  - will continue the current regimen

## 2011-08-12 NOTE — Assessment & Plan Note (Signed)
Results for Chelsea [name], Chelsea [name] (MRN [id number]) as of 08/12/2011 13:09  Ref. Range 08/09/2011 14:27  Cholesterol Latest Range: 0-200 mg/dL 206 (H)  Triglycerides Latest Range: <150 mg/dL 104  HDL Latest Range: >39 mg/dL 66  LDL (calc) Latest Range: 0-99 mg/dL 119 (H)  VLDL Latest Range: 0-40 mg/dL 21  Total CHOL/HDL Ratio No range found 3.1   LDL slightly better. Will continue the current therapy

## 2011-08-12 NOTE — Assessment & Plan Note (Signed)
Patient is on Norvasc, Atenolol and Losartan-HCTZ now. Her BP is 153/66 today, and patient states that her BP monitoring is broken and she has not checked her BP lately. Patient states that she usually has fairly low diastolic blood pressure and increasing antihypertensive medications will usually not affect her systolic BP but her diastolic BP will drop further.    - after carefully evaluate her medications, we will not escalate her antihypertensive medications.  - will ask patient to stick to the plan for lifestyle change. - she has lost 4 lbs since March and will continue to work on her weight loss.

## 2011-08-21 ENCOUNTER — Other Ambulatory Visit: Payer: Self-pay | Admitting: *Deleted

## 2011-08-21 DIAGNOSIS — E119 Type 2 diabetes mellitus without complications: Secondary | ICD-10-CM

## 2011-08-22 MED ORDER — METFORMIN HCL 1000 MG PO TABS: 1000.0000 mg | ORAL_TABLET | Freq: Two times a day (BID) | ORAL | Status: DC

## 2011-10-16 ENCOUNTER — Other Ambulatory Visit: Payer: Self-pay | Admitting: Internal Medicine

## 2011-11-02 ENCOUNTER — Telehealth: Payer: Self-pay | Admitting: Internal Medicine

## 2011-11-02 MED ORDER — INSULIN ASPART PROT & ASPART (70-30 MIX) 100 UNIT/ML ~~LOC~~ SUSP: SUBCUTANEOUS | Status: DC

## 2011-11-02 NOTE — Telephone Encounter (Signed)
Patient called for Insulin Pen refills. Refills sent to the pharmacy and patient is instructed to pick it up.

## 2011-11-08 ENCOUNTER — Encounter: Payer: Self-pay | Admitting: Internal Medicine

## 2011-11-08 ENCOUNTER — Ambulatory Visit (INDEPENDENT_AMBULATORY_CARE_PROVIDER_SITE_OTHER): Payer: Medicaid Other | Admitting: Internal Medicine

## 2011-11-08 VITALS — BP 148/59 | HR 64 | Temp 97.6°F | Ht 64.0 in | Wt 229.9 lb

## 2011-11-08 DIAGNOSIS — Z79899 Other long term (current) drug therapy: Secondary | ICD-10-CM

## 2011-11-08 DIAGNOSIS — M79609 Pain in unspecified limb: Secondary | ICD-10-CM

## 2011-11-08 DIAGNOSIS — M25569 Pain in unspecified knee: Secondary | ICD-10-CM

## 2011-11-08 DIAGNOSIS — M25561 Pain in right knee: Secondary | ICD-10-CM

## 2011-11-08 DIAGNOSIS — E119 Type 2 diabetes mellitus without complications: Secondary | ICD-10-CM

## 2011-11-08 DIAGNOSIS — M170 Bilateral primary osteoarthritis of knee: Secondary | ICD-10-CM | POA: Insufficient documentation

## 2011-11-08 LAB — POCT GLYCOSYLATED HEMOGLOBIN (HGB A1C): Hemoglobin A1C: 6.4

## 2011-11-08 LAB — GLUCOSE, CAPILLARY: Glucose-Capillary: 63 mg/dL — ABNORMAL LOW (ref 70–99)

## 2011-11-08 MED ORDER — HYDROCODONE-ACETAMINOPHEN 5-325 MG PO TABS: 1.0000 | ORAL_TABLET | Freq: Four times a day (QID) | ORAL | Status: AC | PRN
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
## 2011-11-08 NOTE — Assessment & Plan Note (Addendum)
Her symptom is very vague and nonspecific. Unlikely ligaments injury.  She may have worsening of her OA or ? Medial meniscal tear  - need to order right knee ultrasound ( > 80% sensitivity for meniscal tear)>>>defer it to sports medicine - conservative management Rest the knee.   Avoid positions and activities that place excessive pressure on the knee joint until pain and swelling resolve. Such activities include: squatting, kneeling, twisting and pivoting, repetitive bending (eg, stairs, getting out of a seated position, clutch and pedal pushing), jogging, exercise classes, dancing, swimming using the frog or whip kick, and bicycling.  Apply ice to the knee for 15 minutes every four to six hours, while keeping the leg elevated. Need to refer to Sports medicine

## 2011-11-08 NOTE — Patient Instructions (Signed)
  Rest the knee.   Avoid positions and activities that place excessive pressure on the knee joint until pain and swelling resolve. Such activities include: squatting, kneeling, twisting and pivoting, repetitive bending (eg, stairs, getting out of a seated position, clutch and pedal pushing), jogging, exercise classes, dancing, swimming using the frog or whip kick, and bicycling.  Apply ice to the knee for 15 minutes every four to six hours, while keeping the leg elevated. will refer to Sports medicine

## 2011-11-16 ENCOUNTER — Encounter: Payer: Self-pay | Admitting: Family Medicine

## 2011-11-16 ENCOUNTER — Ambulatory Visit (INDEPENDENT_AMBULATORY_CARE_PROVIDER_SITE_OTHER): Payer: Medicaid Other | Admitting: Family Medicine

## 2011-11-16 VITALS — BP 143/74 | HR 64 | Ht 64.0 in | Wt 229.0 lb

## 2011-11-16 DIAGNOSIS — M25561 Pain in right knee: Secondary | ICD-10-CM

## 2011-11-16 DIAGNOSIS — M25569 Pain in unspecified knee: Secondary | ICD-10-CM

## 2011-11-16 NOTE — Patient Instructions (Addendum)
Thank you for coming in today. I think you have a meniscus injury.  You will need to see a Psychologist, sport and exercise.  We will get you an appointments today at 2:15 with the sports medicine doctor at [facility] orthopedics to get that MRI scheduled ASAP.  They can also fit you for a knee brace if needed.

## 2011-11-16 NOTE — Progress Notes (Signed)
Chelsea [name] is a 61 y.o. female who presents to [facility] today for right knee pain.  Pain present  For 2.5 weeks.  In the medial aspect of the knee.  Patient was standing at church and turned. With the rotation she felt a pop in the medial aspect of her knee and continued and immediate pain. She notes knee swelling locking catching and giving way. She has pain with weightbearing of the right leg. No radiating pain new weakness or numbness.  She is a pertinent medical history for right hip necrotizing fasciitis which has left her right leg week.  She uses a cane to ambulate.     PMH reviewed. Necrotizing fasciitis of the right hip History  Substance Use Topics  . Smoking status: Never Smoker   . Smokeless tobacco: Never Used  . Alcohol Use: No   ROS as above otherwise neg   Exam:  BP 143/74  Pulse 64  Ht 5\' 4"  (1.626 m)  Wt 229 lb (103.874 kg)  BMI 39.31 kg/m2 Gen: Well NAD MSK: Right knee:  Mild effusion present.  Range of motion 0-90 with pain on extension and flexion.  No patellar crepitations  Significant pain on palpation of medial joint line. Nontender over lateral joint line. Dramatically positive medial McMurray's test.  Negative Lachman's anterior drawer valgus and varus stress.   Neuro: Sensation is intact distally CV: Non-edematous bilateral extremities

## 2011-11-16 NOTE — Assessment & Plan Note (Signed)
New issue today Significant concern for medial meniscus injury. Plan:  Immediate referral to orthopedic surgery for workup and treatment.  She is an appointment for 2:15 this afternoon at [place] with Dr. Alfonso Ramus. She likely will have x-rays be scheduled for MRI and followup with an orthopedic surgeon.  Discussed warning signs or symptoms. Please see discharge instructions. Patient expresses understanding.

## 2011-11-19 ENCOUNTER — Encounter: Payer: Self-pay | Admitting: Internal Medicine

## 2011-11-28 ENCOUNTER — Other Ambulatory Visit: Payer: Self-pay | Admitting: Internal Medicine

## 2011-12-07 ENCOUNTER — Other Ambulatory Visit: Payer: Self-pay | Admitting: Sports Medicine

## 2011-12-07 DIAGNOSIS — M25561 Pain in right knee: Secondary | ICD-10-CM

## 2011-12-08 ENCOUNTER — Ambulatory Visit
Admission: RE | Admit: 2011-12-08 | Discharge: 2011-12-08 | Disposition: A | Discharge status: Home / Self Care | Payer: Medicaid Other | Source: Ambulatory Visit | Attending: Sports Medicine | Admitting: Sports Medicine

## 2011-12-08 DIAGNOSIS — M25561 Pain in right knee: Secondary | ICD-10-CM

## 2011-12-12 ENCOUNTER — Telehealth: Payer: Self-pay | Admitting: *Deleted

## 2011-12-12 NOTE — Telephone Encounter (Signed)
Pt called  Since she had cortisone injection in knee CBG 170-200. Sports Med suggest surgery - can not do till CBG are under control. Appt made 12/18/11 9:45AM Dr Posey Pronto. Will bring CBG log book - unable to download meter. Hilda Blades Mckay Brandt RN 12/12/11 11:30AM

## 2011-12-18 ENCOUNTER — Encounter: Payer: Self-pay | Admitting: Internal Medicine

## 2011-12-18 ENCOUNTER — Ambulatory Visit (INDEPENDENT_AMBULATORY_CARE_PROVIDER_SITE_OTHER): Payer: Medicaid Other | Admitting: Internal Medicine

## 2011-12-18 VITALS — BP 158/66 | HR 61 | Temp 97.0°F | Resp 20 | Ht 65.0 in | Wt 231.6 lb

## 2011-12-18 DIAGNOSIS — M25561 Pain in right knee: Secondary | ICD-10-CM

## 2011-12-18 DIAGNOSIS — E119 Type 2 diabetes mellitus without complications: Secondary | ICD-10-CM

## 2011-12-18 DIAGNOSIS — M25569 Pain in unspecified knee: Secondary | ICD-10-CM

## 2011-12-18 NOTE — Progress Notes (Signed)
  Subjective:    [name], female    DOB: [birth year], 61 y.o.   MRN: [id number]  HPI patient is a pleasant 61 year woman who comes the clinic for surgery clearance. She had right medial meniscal tear recently and was seen by her PCP in our clinic and then was referred to sports with the clinic where Dr. Nori Riis saw her and urgently sent her to orthopedics office the same day- where she had a steroid injection per patient. After that her sugars were running high for a while and so she was advised to come to clinic and get clearance for surgery due to her high sugars. She brings her log of her CBGs from October 1 in November 5 checked once every day and I do not see any see which is more than 200. Also CBG done today in our clinic was 91. She denies any fever, nausea vomiting, headache, palpitations, chest pain, short of breath, abdominal pain.  She is a little anxious for her surgery as she had multiple surgeries on the same extremity and says that she is likely to have that leg intact. Patient has a cane but her to help her walk.  Review of Systems    As per history of present illness, all other systems reviewed and negative. Objective:   Physical Exam  General: NAD.  HEENT: PERRL, EOMI, no scleral icterus Cardiac: RRR, no rubs, murmurs or gallops Pulm: clear to auscultation bilaterally, moving normal volumes of air Abd: soft, nontender, nondistended, BS present Ext: Right knee tenderness to palpation on medial side.  Neuro: alert and oriented X3, cranial nerves II-XII grossly intact       Assessment & Plan:

## 2011-12-18 NOTE — Assessment & Plan Note (Signed)
Lab Results  Component Value Date   HGBA1C 6.4 11/08/2011   HGBA1C 9.4 02/02/2010   CREATININE 0.89 08/09/2011   CREATININE 0.95 02/02/2010   MICROALBUR 9.62* 05/03/2011   MICRALBCREAT 165.9* 05/03/2011   CHOL 206* 08/09/2011   HDL 66 08/09/2011   TRIG 104 08/09/2011    Last eye exam and foot exam: No results found for this basename: HMDIABEYEEXA, HMDIABFOOTEX    Assessment: Diabetes control: controlled Progress toward goals: at goal Barriers to meeting goals: no barriers identified  Plan: Diabetes treatment: continue current medications Refer to: none Instruction/counseling given: reminded to bring blood glucose meter & log to each visit, reminded to bring medications to each visit and discussed diet. Patient has a log of her daily CBGs for last month. None of them are more than 200. CBG today in our clinic is 91. Patient's last A1c was 6.2 in September and is at goal. The temporary increase in CBG after steroid injection has apparently resolved and so patient can go ahead and schedule surgery for a right meniscal injury. We will fax this note to orthopedics office. Please feel free to contact us if needed.

## 2011-12-18 NOTE — Patient Instructions (Signed)
Please followup with your appointment with your doctor in December. You can go ahead and have the knee surgery at the sugars under control now. Please inform the surgeon's office that if they need the copy of our clinic notes, the can contact us and we can fax them. Good luck for surgery.

## 2011-12-18 NOTE — Assessment & Plan Note (Signed)
Possible right meniscal tear. Arthroscopic surgery to be scheduled by the end of this month. Her sugars were running high for a while after steroid injection which was an impediment for scheduling her surgery. She was seen to be  well controlled at present. I think should be okay to get surgery.

## 2011-12-18 NOTE — Progress Notes (Signed)
Current ofc visit notes, pt's glucose log and pt "snapshot" faxed to Dr. Mardelle Matte re: pt's readiness for proposed right knee surgery, per VO Dr. Alfonso Patten Patel/Cylinda Santoli,RN, 12/18/2011 - 10:49A

## 2011-12-20 ENCOUNTER — Other Ambulatory Visit: Payer: Self-pay | Admitting: *Deleted

## 2011-12-22 MED ORDER — ACCU-CHEK AVIVA PLUS W/DEVICE KIT: 1.0000 | PACK | Freq: Three times a day (TID) | Status: DC

## 2012-01-24 ENCOUNTER — Encounter: Payer: Medicaid Other | Admitting: Internal Medicine

## 2012-02-26 ENCOUNTER — Other Ambulatory Visit: Payer: Self-pay | Admitting: *Deleted

## 2012-02-26 DIAGNOSIS — I10 Essential (primary) hypertension: Secondary | ICD-10-CM

## 2012-02-26 NOTE — Telephone Encounter (Signed)
Last filled 01/21/12 Qty#30.

## 2012-02-27 ENCOUNTER — Other Ambulatory Visit: Payer: Self-pay | Admitting: *Deleted

## 2012-02-27 MED ORDER — INSULIN ASPART PROT & ASPART (70-30 MIX) 100 UNIT/ML ~~LOC~~ SUSP: SUBCUTANEOUS | Status: DC

## 2012-02-27 MED ORDER — AMLODIPINE BESYLATE 5 MG PO TABS: 5.0000 mg | ORAL_TABLET | Freq: Every day | ORAL | Status: DC

## 2012-02-27 NOTE — Telephone Encounter (Signed)
Pt has been out of this med for 2 days.  Please refill

## 2012-02-27 NOTE — Telephone Encounter (Signed)
Please indicate which medication I should refill. Thanks.

## 2012-03-20 ENCOUNTER — Ambulatory Visit (INDEPENDENT_AMBULATORY_CARE_PROVIDER_SITE_OTHER): Payer: Medicaid Other | Admitting: Internal Medicine

## 2012-03-20 ENCOUNTER — Encounter: Payer: Self-pay | Admitting: Internal Medicine

## 2012-03-20 VITALS — BP 138/64 | HR 65 | Temp 97.8°F | Ht 65.0 in | Wt 228.9 lb

## 2012-03-20 DIAGNOSIS — F4321 Adjustment disorder with depressed mood: Secondary | ICD-10-CM

## 2012-03-20 DIAGNOSIS — I10 Essential (primary) hypertension: Secondary | ICD-10-CM

## 2012-03-20 DIAGNOSIS — J45909 Unspecified asthma, uncomplicated: Secondary | ICD-10-CM

## 2012-03-20 DIAGNOSIS — E119 Type 2 diabetes mellitus without complications: Secondary | ICD-10-CM

## 2012-03-20 DIAGNOSIS — M79609 Pain in unspecified limb: Secondary | ICD-10-CM

## 2012-03-20 DIAGNOSIS — E785 Hyperlipidemia, unspecified: Secondary | ICD-10-CM

## 2012-03-20 DIAGNOSIS — J069 Acute upper respiratory infection, unspecified: Secondary | ICD-10-CM

## 2012-03-20 DIAGNOSIS — J302 Other seasonal allergic rhinitis: Secondary | ICD-10-CM

## 2012-03-20 DIAGNOSIS — M25561 Pain in right knee: Secondary | ICD-10-CM

## 2012-03-20 DIAGNOSIS — E1139 Type 2 diabetes mellitus with other diabetic ophthalmic complication: Secondary | ICD-10-CM

## 2012-03-20 DIAGNOSIS — Z79899 Other long term (current) drug therapy: Secondary | ICD-10-CM

## 2012-03-20 MED ORDER — HYDROCODONE-ACETAMINOPHEN 5-325 MG PO TABS: 1.0000 | ORAL_TABLET | Freq: Four times a day (QID) | ORAL | Status: DC | PRN

## 2012-03-20 MED ORDER — ALBUTEROL SULFATE HFA 108 (90 BASE) MCG/ACT IN AERS: 2.0000 | INHALATION_SPRAY | Freq: Four times a day (QID) | RESPIRATORY_TRACT | Status: DC | PRN
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
## 2012-03-20 NOTE — Assessment & Plan Note (Signed)
Goals (1 Years of Data) as of 03/20/2012          As of Today As of Today 12/18/11 11/16/11 11/08/11     Blood Pressure    . Blood Pressure < 140/90  138/64 154/60 158/66 143/74 148/59     Result Component    . HEMOGLOBIN A1C < 7.0  6.9    6.4    . LDL CALC < 100

## 2012-03-20 NOTE — Assessment & Plan Note (Signed)
Lab Results  Component Value Date   HGBA1C 6.9 03/20/2012   HGBA1C 6.4 11/08/2011   HGBA1C 6.6 08/09/2011     Assessment:  Diabetes control: good control (HgbA1C at goal)  Progress toward A1C goal:  at goal   Plan:  Medications:  continue current medications  Home glucose monitoring:   Frequency: 3 times a day   Timing:    Instruction/counseling given: reminded to get eye exam  Educational resources provided:    Self management tools provided: copy of home glucose meter download;home glucose logbook;instructions for home glucose monitoring;home glucose testing supplies;home glucose meter

## 2012-03-20 NOTE — Assessment & Plan Note (Signed)
Mild code like Symptoms less than one week.  Reports she is recovering from a cold.  -Encourage fluid intake and rest

## 2012-03-20 NOTE — Patient Instructions (Addendum)
1. Follow up in 3 months 2. Good work for your sugar control 3. Take your Blood pressure and sugar as instructed.   General Instructions:    Treatment Goals:  Goals (1 Years of Data) as of 03/20/2012          As of Today As of Today 12/18/11 11/16/11 11/08/11     Blood Pressure    . Blood Pressure < 140/90  138/64 154/60 158/66 143/74 148/59     Result Component    . HEMOGLOBIN A1C < 7.0  6.9    6.4    . LDL CALC < 100            Progress Toward Treatment Goals:  Treatment Goal 03/20/2012  Hemoglobin A1C at goal  Blood pressure at goal    Self Care Goals & Plans:  Self Care Goal 03/20/2012  Manage my medications take my medicines as prescribed; follow the sick day instructions if I am sick; bring my medications to every visit; refill my medications on time  Monitor my health keep track of my blood glucose; bring my blood pressure log to each visit; bring my glucose meter and log to each visit; keep track of my weight; check my feet daily; keep track of my blood pressure  Eat healthy foods drink diet soda or water instead of juice or soda; eat baked foods instead of fried foods; eat fruit for snacks and desserts; eat more vegetables; eat foods that are low in salt; eat smaller portions  Be physically active find an activity I enjoy    Home Blood Glucose Monitoring 03/20/2012  Check my blood sugar 3 times a day     Care Management & Community Referrals:  Referral 03/20/2012  Referrals made for care management support diabetes educator

## 2012-03-21 ENCOUNTER — Telehealth: Payer: Self-pay | Admitting: *Deleted

## 2012-03-21 NOTE — Telephone Encounter (Signed)
Call from pt stating her inhaler rx was called to the pharmacy. The pharmacy states her insurance does not cover Albuterol inhaler but will cover Proventil. Either prior authorization needs to be done or send new rx for Proventil.. Thanks

## 2012-03-24 MED ORDER — ALBUTEROL SULFATE HFA 108 (90 BASE) MCG/ACT IN AERS: 2.0000 | INHALATION_SPRAY | Freq: Four times a day (QID) | RESPIRATORY_TRACT | Status: DC | PRN

## 2012-04-04 ENCOUNTER — Other Ambulatory Visit: Payer: Self-pay | Admitting: Internal Medicine

## 2012-04-10 ENCOUNTER — Other Ambulatory Visit: Payer: Self-pay | Admitting: Internal Medicine

## 2012-04-10 DIAGNOSIS — Z1231 Encounter for screening mammogram for malignant neoplasm of breast: Secondary | ICD-10-CM

## 2012-04-19 ENCOUNTER — Encounter: Payer: Self-pay | Admitting: Internal Medicine

## 2012-04-19 DIAGNOSIS — L84 Corns and callosities: Secondary | ICD-10-CM | POA: Insufficient documentation

## 2012-04-22 ENCOUNTER — Ambulatory Visit (HOSPITAL_COMMUNITY)
Admission: RE | Admit: 2012-04-22 | Discharge: 2012-04-22 | Disposition: A | Discharge status: Home / Self Care | Payer: Medicaid Other | Source: Ambulatory Visit | Attending: Internal Medicine | Admitting: Internal Medicine

## 2012-04-22 DIAGNOSIS — Z1231 Encounter for screening mammogram for malignant neoplasm of breast: Secondary | ICD-10-CM

## 2012-05-01 ENCOUNTER — Ambulatory Visit: Payer: Medicaid Other | Admitting: Physical Therapy

## 2012-05-08 ENCOUNTER — Ambulatory Visit: Payer: Medicaid Other | Admitting: Physical Therapy

## 2012-05-08 ENCOUNTER — Ambulatory Visit: Payer: Medicaid Other | Attending: Orthopedic Surgery | Admitting: Physical Therapy

## 2012-05-08 DIAGNOSIS — M25569 Pain in unspecified knee: Secondary | ICD-10-CM | POA: Insufficient documentation

## 2012-05-08 DIAGNOSIS — M25669 Stiffness of unspecified knee, not elsewhere classified: Secondary | ICD-10-CM | POA: Insufficient documentation

## 2012-05-08 DIAGNOSIS — IMO0001 Reserved for inherently not codable concepts without codable children: Secondary | ICD-10-CM | POA: Insufficient documentation

## 2012-05-13 ENCOUNTER — Encounter: Payer: Self-pay | Admitting: Internal Medicine

## 2012-05-13 ENCOUNTER — Ambulatory Visit: Payer: Medicaid Other | Attending: Orthopedic Surgery | Admitting: Physical Therapy

## 2012-05-13 DIAGNOSIS — M25669 Stiffness of unspecified knee, not elsewhere classified: Secondary | ICD-10-CM | POA: Insufficient documentation

## 2012-05-13 DIAGNOSIS — IMO0001 Reserved for inherently not codable concepts without codable children: Secondary | ICD-10-CM | POA: Insufficient documentation

## 2012-05-13 DIAGNOSIS — M25569 Pain in unspecified knee: Secondary | ICD-10-CM | POA: Insufficient documentation

## 2012-05-14 ENCOUNTER — Other Ambulatory Visit: Payer: Self-pay | Admitting: Internal Medicine

## 2012-05-16 ENCOUNTER — Other Ambulatory Visit: Payer: Self-pay | Admitting: *Deleted

## 2012-05-16 DIAGNOSIS — I10 Essential (primary) hypertension: Secondary | ICD-10-CM

## 2012-05-16 MED ORDER — LOSARTAN POTASSIUM-HCTZ 100-25 MG PO TABS: 1.0000 | ORAL_TABLET | Freq: Every day | ORAL | Status: DC

## 2012-05-20 ENCOUNTER — Ambulatory Visit: Payer: Medicaid Other | Admitting: Physical Therapy

## 2012-05-27 ENCOUNTER — Ambulatory Visit: Payer: Medicaid Other | Admitting: Physical Therapy

## 2012-05-28 ENCOUNTER — Ambulatory Visit: Payer: Medicaid Other | Admitting: Physical Therapy

## 2012-06-26 ENCOUNTER — Encounter: Payer: Self-pay | Admitting: Internal Medicine

## 2012-06-26 ENCOUNTER — Ambulatory Visit (INDEPENDENT_AMBULATORY_CARE_PROVIDER_SITE_OTHER): Payer: Medicaid Other | Admitting: Internal Medicine

## 2012-06-26 ENCOUNTER — Encounter: Payer: Medicaid Other | Admitting: Internal Medicine

## 2012-06-26 VITALS — BP 154/61 | HR 84 | Temp 97.8°F | Ht 65.0 in | Wt 234.4 lb

## 2012-06-26 DIAGNOSIS — I10 Essential (primary) hypertension: Secondary | ICD-10-CM

## 2012-06-26 DIAGNOSIS — M25561 Pain in right knee: Secondary | ICD-10-CM

## 2012-06-26 DIAGNOSIS — E119 Type 2 diabetes mellitus without complications: Secondary | ICD-10-CM

## 2012-06-26 DIAGNOSIS — J309 Allergic rhinitis, unspecified: Secondary | ICD-10-CM

## 2012-06-26 DIAGNOSIS — M25569 Pain in unspecified knee: Secondary | ICD-10-CM

## 2012-06-26 LAB — GLUCOSE, CAPILLARY: Glucose-Capillary: 71 mg/dL (ref 70–99)

## 2012-06-26 MED ORDER — HYDROCODONE-ACETAMINOPHEN 5-325 MG PO TABS: 1.0000 | ORAL_TABLET | Freq: Four times a day (QID) | ORAL | Status: DC | PRN

## 2012-06-26 MED ORDER — FEXOFENADINE HCL 180 MG PO TABS: 180.0000 mg | ORAL_TABLET | Freq: Every day | ORAL | Status: DC

## 2012-06-26 MED ORDER — PRAVASTATIN SODIUM 40 MG PO TABS: 40.0000 mg | ORAL_TABLET | Freq: Every evening | ORAL | Status: DC

## 2012-06-26 MED ORDER — HYDROCORTISONE 1 % EX CREA: TOPICAL_CREAM | CUTANEOUS | Status: AC

## 2012-06-26 MED ORDER — AMLODIPINE BESYLATE 10 MG PO TABS: 10.0000 mg | ORAL_TABLET | Freq: Every day | ORAL | Status: DC

## 2012-06-26 NOTE — Patient Instructions (Addendum)
1. Follow up in 3 months 2. Will increase your amlodipine to 10 mg po daily 3. Will give you allegra and hydrocortisone cream,   General Instructions:    Treatment Goals:  Goals (1 Years of Data) as of 06/26/12         As of Today 03/20/12 03/20/12 12/18/11 11/16/11     Blood Pressure    . Blood Pressure < 130/80  154/61 138/64 154/60 158/66 143/74    . Blood Pressure < 130/80  154/61 138/64 154/60 158/66 143/74     Result Component    . HEMOGLOBIN A1C < 7.0  7.5 6.9       . LDL CALC < 100          . LDL CALC < 100            Progress Toward Treatment Goals:  Treatment Goal 06/26/2012  Hemoglobin A1C deteriorated  Blood pressure deteriorated  Prevent falls improved    Self Care Goals & Plans:  Self Care Goal 06/26/2012  Manage my medications take my medicines as prescribed; refill my medications on time; follow the sick day instructions if I am sick; bring my medications to every visit  Monitor my health keep track of my blood glucose; bring my glucose meter and log to each visit  Eat healthy foods drink diet soda or water instead of juice or soda  Be physically active find an activity I enjoy  Meeting treatment goals maintain the current self-care plan    Home Blood Glucose Monitoring 06/26/2012  Check my blood sugar 3 times a day  When to check my blood sugar before breakfast; before lunch; before dinner     Care Management & Community Referrals:  Referral 06/26/2012  Referrals made for care management support diabetes educator  Referrals made to community resources weight management

## 2012-06-26 NOTE — Assessment & Plan Note (Addendum)
Her blood pressure is moderately elevated today. The repeated BP is the same. She reports medical compliance with her medications.  - I reviewed her BP for last year,which ranges between 140-150's/60-80's. Her goal of BP is < 130/80 - will increase Norvasc to 10 mg po daily.  BP Readings from Last 3 Encounters:  06/26/12 154/61  03/20/12 138/64  12/18/11 158/66    Lab Results  Component Value Date   Chelsea [name] 138 08/09/2011   K 4.5 08/09/2011   CREATININE 0.89 08/09/2011    Assessment: Blood pressure control: mildly elevated Progress toward BP goal:  deteriorated Comments:   Plan: Medications:  continue current medications Educational resources provided: brochure Self management tools provided: home blood pressure logbook Other plans:
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
## 2012-06-26 NOTE — Assessment & Plan Note (Signed)
On Flonase for allergic rhinitis. Reports that it is not sufficient enough to control her symptoms.  Also reports that she has mild arm skin itching whenever she is near tree or flower.   - will add systemic control with Allegra  - will give her hydrocortisone 1 % cream PRN

## 2012-06-26 NOTE — Assessment & Plan Note (Signed)
HGB A1C 7.5 today, which is slightly higher than last HGB A1C of 7.5.  She admits excessive calory intake for past 2 months.  - will continue the current insulin and metformin regimen. She is instructed better diet and exercise plan.  Lab Results  Component Value Date   HGBA1C 7.5 06/26/2012   HGBA1C 6.9 03/20/2012   HGBA1C 6.4 11/08/2011     Assessment: Diabetes control: fair control Progress toward A1C goal:  deteriorated Comments:   Plan: Medications:  continue current medications Home glucose monitoring: Frequency: 3 times a day Timing: before breakfast;before lunch;before dinner Instruction/counseling given: reminded to get eye exam, reminded to bring blood glucose meter & log to each visit, reminded to bring medications to each visit, discussed foot care, discussed the need for weight loss, discussed diet and discussed sick day management Educational resources provided: brochure Self management tools provided: copy of home glucose meter download Other plans:

## 2012-06-30 NOTE — Progress Notes (Signed)
Case discussed with Dr. Nicoletta Dress soon after she evaluated the patient. We reviewed the resident's history and exam and pertinent patient test results. I agree with the assessment, diagnosis and plan of care documented in the resident's note.

## 2012-07-22 ENCOUNTER — Encounter: Payer: Self-pay | Admitting: Dietician

## 2012-10-02 ENCOUNTER — Ambulatory Visit (INDEPENDENT_AMBULATORY_CARE_PROVIDER_SITE_OTHER): Payer: Medicaid Other | Admitting: Internal Medicine

## 2012-10-02 ENCOUNTER — Encounter: Payer: Self-pay | Admitting: Internal Medicine

## 2012-10-02 ENCOUNTER — Other Ambulatory Visit: Payer: Self-pay | Admitting: Internal Medicine

## 2012-10-02 VITALS — BP 150/64 | HR 80 | Temp 97.5°F | Ht 65.0 in | Wt 231.8 lb

## 2012-10-02 DIAGNOSIS — L720 Epidermal cyst: Secondary | ICD-10-CM

## 2012-10-02 DIAGNOSIS — M25569 Pain in unspecified knee: Secondary | ICD-10-CM

## 2012-10-02 DIAGNOSIS — L723 Sebaceous cyst: Secondary | ICD-10-CM

## 2012-10-02 DIAGNOSIS — E119 Type 2 diabetes mellitus without complications: Secondary | ICD-10-CM

## 2012-10-02 DIAGNOSIS — M25561 Pain in right knee: Secondary | ICD-10-CM

## 2012-10-02 LAB — GLUCOSE, CAPILLARY: Glucose-Capillary: 152 mg/dL — ABNORMAL HIGH (ref 70–99)

## 2012-10-02 LAB — POCT GLYCOSYLATED HEMOGLOBIN (HGB A1C): Hemoglobin A1C: 7.5

## 2012-10-02 MED ORDER — HYDROCODONE-ACETAMINOPHEN 5-325 MG PO TABS: 1.0000 | ORAL_TABLET | Freq: Four times a day (QID) | ORAL | Status: DC | PRN
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
## 2012-10-02 NOTE — Patient Instructions (Addendum)
Will see you in 3 months Please use the warm compress to your neck 2-3 times daily. Please call the clinic or go to urgent care with any signs of infection including fever, chills, redness, tenderness, Increased temperature of the skin over the bumps or lumps, Grayish-white, bad smelling material that drains from the bump or lump.   Epidermal Cyst An epidermal cyst is sometimes called a sebaceous cyst, epidermal inclusion cyst, or infundibular cyst. These cysts usually contain a substance that looks "pasty" or "cheesy" and may have a bad smell. This substance is a protein called keratin. Epidermal cysts are usually found on the face, neck, or trunk. They may also occur in the vaginal area or other parts of the genitalia of both men and women. Epidermal cysts are usually small, painless, slow-growing bumps or lumps that move freely under the skin. It is important not to try to pop them. This may cause an infection and lead to tenderness and swelling. CAUSES  Epidermal cysts may be caused by a deep penetrating injury to the skin or a plugged hair follicle, often associated with acne. SYMPTOMS  Epidermal cysts can become inflamed and cause:  Redness.  Tenderness.  Increased temperature of the skin over the bumps or lumps.  Grayish-white, bad smelling material that drains from the bump or lump. DIAGNOSIS  Epidermal cysts are easily diagnosed by your caregiver during an exam. Rarely, a tissue sample (biopsy) may be taken to rule out other conditions that may resemble epidermal cysts. TREATMENT   Epidermal cysts often get better and disappear on their own. They are rarely ever cancerous.  If a cyst becomes infected, it may become inflamed and tender. This may require opening and draining the cyst. Treatment with antibiotics may be necessary. When the infection is gone, the cyst may be removed with minor surgery.  Small, inflamed cysts can often be treated with antibiotics or by injecting steroid  medicines.  Sometimes, epidermal cysts become large and bothersome. If this happens, surgical removal in your caregiver's office may be necessary. HOME CARE INSTRUCTIONS  Only take over-the-counter or prescription medicines as directed by your caregiver.  Take your antibiotics as directed. Finish them even if you start to feel better. SEEK MEDICAL CARE IF:   Your cyst becomes tender, red, or swollen.  Your condition is not improving or is getting worse.  You have any other questions or concerns. MAKE SURE YOU:  Understand these instructions.  Will watch your condition.  Will get help right away if you are not doing well or get worse. Document Released: 12/31/2003 Document Revised: 04/23/2011 Document Reviewed: 08/07/2010 [facility] Patient Information 2014 [place], [place].

## 2012-10-03 DIAGNOSIS — L720 Epidermal cyst: Secondary | ICD-10-CM | POA: Insufficient documentation

## 2012-10-03 NOTE — Assessment & Plan Note (Signed)
Lab Results  Component Value Date   HGBA1C 7.5 10/02/2012   HGBA1C 7.5 06/26/2012   HGBA1C 6.9 03/20/2012     Assessment: Diabetes control: good control (HgbA1C at goal) Progress toward A1C goal:  at goal Comments:   Plan: Medications:  continue current medications Home glucose monitoring: Frequency: 2 times a day Timing: before breakfast;before dinner Instruction/counseling given: reminded to get eye exam, reminded to bring blood glucose meter & log to each visit, reminded to bring medications to each visit, discussed the need for weight loss, discussed diet, discussed sick day management, provided printed educational material and other instruction/counseling: continue current therapy Educational resources provided:   Self management tools provided:   Other plans: continue current therapy.

## 2012-10-03 NOTE — Assessment & Plan Note (Signed)
The clinical presentation is consistent with the Dx. No signs of infection. The DDX include Pilar cyst which occur on the scalp and face and are derived from hair root sheaths.   Uninfected epidermoid cyst can resolve spontaneously. Patient is instructed to use warm compress 2-3 times daily. Patient is instructed to return to the clinic or go to the ED if there are signs of infection including erythema, warmth to touch, or tenderness to palpation. Could consider surgical excision if reoccurrence in the future.

## 2012-10-05 NOTE — Progress Notes (Signed)
I saw and evaluated the patient.  I personally confirmed the key portions of Dr. Li's history and exam and reviewed pertinent patient test results.  The assessment, diagnosis, and plan were formulated together and I agree with the documentation in the resident's note. 

## 2012-10-10 ENCOUNTER — Ambulatory Visit (INDEPENDENT_AMBULATORY_CARE_PROVIDER_SITE_OTHER): Payer: Medicaid Other | Admitting: Podiatry

## 2012-10-10 DIAGNOSIS — L97509 Non-pressure chronic ulcer of other part of unspecified foot with unspecified severity: Secondary | ICD-10-CM

## 2012-10-10 DIAGNOSIS — L84 Corns and callosities: Secondary | ICD-10-CM | POA: Insufficient documentation

## 2012-10-10 DIAGNOSIS — E118 Type 2 diabetes mellitus with unspecified complications: Secondary | ICD-10-CM

## 2012-10-10 DIAGNOSIS — B351 Tinea unguium: Secondary | ICD-10-CM | POA: Insufficient documentation

## 2012-10-10 DIAGNOSIS — M25579 Pain in unspecified ankle and joints of unspecified foot: Secondary | ICD-10-CM

## 2012-10-10 DIAGNOSIS — E1169 Type 2 diabetes mellitus with other specified complication: Secondary | ICD-10-CM

## 2012-10-10 NOTE — Progress Notes (Signed)
Subjective: 62 y.o. year old female patient presents complaining of painful nails and calluses. Patient requests toe nails, corns and calluses trimmed. Patient stated that her blood sugar is under control.   Patient Summary List & History reviewed for allergies, medications, medical problems and surgical history.  Objective: Dermatologic: Thick hypertrophic dystrophic nails x 10. Painful plantar callus under 2nd and 3rd MPJ bilateral.  Vascular: Findings reveal warm, good capillary refill and no trophic changes or ulcerative lesions normal DP and PT pulses and no trophic changes or ulcerative lesions Orthopedic: Rectus foot without gross deformities.  Neurologic:  Gait normal. Reflexes normal and symmetric. Sensation grossly normal  Assessment: Dystrophic mycotic nails x 10. Plantar painful calluses bilateral.  Treatment: All mycotic nails, corns, calluses debrided.

## 2012-11-11 ENCOUNTER — Other Ambulatory Visit: Payer: Self-pay | Admitting: Internal Medicine

## 2012-11-11 ENCOUNTER — Other Ambulatory Visit: Payer: Self-pay | Admitting: *Deleted

## 2012-11-12 MED ORDER — INSULIN ASPART PROT & ASPART (70-30 MIX) 100 UNIT/ML ~~LOC~~ SUSP: SUBCUTANEOUS | Status: DC

## 2013-01-13 ENCOUNTER — Encounter: Payer: Self-pay | Admitting: Internal Medicine

## 2013-01-13 ENCOUNTER — Telehealth: Payer: Self-pay | Admitting: *Deleted

## 2013-01-13 ENCOUNTER — Ambulatory Visit (INDEPENDENT_AMBULATORY_CARE_PROVIDER_SITE_OTHER): Payer: Medicaid Other | Admitting: Internal Medicine

## 2013-01-13 VITALS — BP 147/68 | HR 66 | Temp 97.4°F | Ht 65.0 in | Wt 243.3 lb

## 2013-01-13 DIAGNOSIS — M25561 Pain in right knee: Secondary | ICD-10-CM

## 2013-01-13 DIAGNOSIS — E119 Type 2 diabetes mellitus without complications: Secondary | ICD-10-CM

## 2013-01-13 DIAGNOSIS — I10 Essential (primary) hypertension: Secondary | ICD-10-CM

## 2013-01-13 MED ORDER — INSULIN ASPART PROT & ASPART (70-30 MIX) 100 UNIT/ML ~~LOC~~ SUSP: SUBCUTANEOUS | Status: DC

## 2013-01-13 MED ORDER — HYDROCODONE-ACETAMINOPHEN 5-325 MG PO TABS: 1.0000 | ORAL_TABLET | Freq: Four times a day (QID) | ORAL | Status: DC | PRN

## 2013-01-13 NOTE — Patient Instructions (Signed)
1. Will increase your insulin to 25 units in am and 20 units at PM 2. Follow up in 2-3 weeks for pap smear.  General Instructions:    Treatment Goals:  Goals (1 Years of Data) as of 01/13/13         As of Today 10/02/12 06/26/12 03/20/12 03/20/12     Blood Pressure    . Blood Pressure < 130/80  147/68 150/64 154/61 138/64 154/60    . Blood Pressure < 130/80  147/68 150/64 154/61 138/64 154/60     Result Component    . HEMOGLOBIN A1C < 7.0  7.9 7.5 7.5 6.9     . LDL CALC < 100          . LDL CALC < 100            Progress Toward Treatment Goals:  Treatment Goal 01/13/2013  Hemoglobin A1C deteriorated  Blood pressure unchanged  Prevent falls -    Self Care Goals & Plans:  Self Care Goal 01/13/2013  Manage my medications take my medicines as prescribed  Monitor my health keep track of my blood glucose  Eat healthy foods drink diet soda or water instead of juice or soda  Be physically active find an activity I enjoy  Meeting treatment goals -    Home Blood Glucose Monitoring 01/13/2013  Check my blood sugar 3 times a day  When to check my blood sugar -     Care Management & Community Referrals:  Referral 01/13/2013  Referrals made for care management support diabetes educator  Referrals made to community resources -
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
## 2013-01-13 NOTE — Assessment & Plan Note (Signed)
Lab Results  Component Value Date   HGBA1C 7.9 01/13/2013   HGBA1C 7.5 10/02/2012   HGBA1C 7.5 06/26/2012     Assessment: Diabetes control: fair control Progress toward A1C goal:  deteriorated Admits dietary discretion.  Plan: # Medications:    Increase her insulin 70/30 to 25 units Qam and 20 units Qpm.  # Home glucose monitoring: Frequency: 3 times a day Timing:  AC Instruction/counseling given: reminded to get eye exam, reminded to bring blood glucose meter & log to each visit, reminded to bring medications to each visit, discussed foot care, discussed the need for weight loss, discussed diet, discussed sick day management and provided printed educational material Educational resources provided:   Self management tools provided:   # Education on diet and calories count. Only eats three meals daily and Avoid overeating.

## 2013-01-13 NOTE — Assessment & Plan Note (Signed)
BP Readings from Last 3 Encounters:  01/13/13 147/68  10/02/12 150/64  06/26/12 154/61    Lab Results  Component Value Date   Chelsea [name] 138 08/09/2011   K 4.5 08/09/2011   CREATININE 0.89 08/09/2011    Assessment: Blood pressure control: mildly elevated Progress toward BP goal:  unchanged Comments: missing her antihypertensive meds for last three days.   Plan: - will contact her pharmacy for preauthorization for losartan/HCTZ. - Continue current therapy.

## 2013-01-13 NOTE — Telephone Encounter (Signed)
Received PA request from pt's pharmacy. Losartan/HCTZ 100-25mg  once daily #30.  Medication is preferred after failure of ACE.  Per pt's chart, she developed cough while on lisinopril and it is now listed on pt's allergy list.  Attempted to submit request online via [place], but request was "suspended" (needed further review).  Contacted [employer] by phone at [phone], request was approved until 01/16/2014, pharmacy made aware(length of call 17 minutes).Regenia Skeeter, Emorie Mcfate Cassady12/2/201412:03 PM  resubmitt

## 2013-01-14 ENCOUNTER — Ambulatory Visit: Payer: Medicaid Other | Admitting: Podiatry

## 2013-01-14 ENCOUNTER — Other Ambulatory Visit: Payer: Self-pay | Admitting: Internal Medicine

## 2013-01-14 MED ORDER — INSULIN ASPART PROT & ASPART (70-30 MIX) 100 UNIT/ML PEN: PEN_INJECTOR | SUBCUTANEOUS | Status: DC

## 2013-01-14 NOTE — Progress Notes (Signed)
Case discussed with Dr. Li soon after the resident saw the patient. We reviewed the resident's history and exam and pertinent patient test results. I agree with the assessment, diagnosis, and plan of care documented in the resident's note. 

## 2013-01-30 ENCOUNTER — Encounter: Payer: Self-pay | Admitting: Internal Medicine

## 2013-01-30 ENCOUNTER — Ambulatory Visit (INDEPENDENT_AMBULATORY_CARE_PROVIDER_SITE_OTHER): Payer: Medicaid Other | Admitting: Internal Medicine

## 2013-01-30 VITALS — BP 161/62 | HR 69 | Temp 97.5°F | Resp 18 | Ht 65.0 in | Wt 245.7 lb

## 2013-01-30 DIAGNOSIS — E119 Type 2 diabetes mellitus without complications: Secondary | ICD-10-CM

## 2013-01-30 DIAGNOSIS — E785 Hyperlipidemia, unspecified: Secondary | ICD-10-CM

## 2013-01-30 DIAGNOSIS — J069 Acute upper respiratory infection, unspecified: Secondary | ICD-10-CM

## 2013-01-30 DIAGNOSIS — J45901 Unspecified asthma with (acute) exacerbation: Secondary | ICD-10-CM

## 2013-01-30 DIAGNOSIS — J45909 Unspecified asthma, uncomplicated: Secondary | ICD-10-CM | POA: Insufficient documentation

## 2013-01-30 LAB — LIPID PANEL
Cholesterol: 256 mg/dL — ABNORMAL HIGH (ref 0–200)
HDL: 87 mg/dL (ref >39)
LDL Cholesterol: 147 mg/dL — ABNORMAL HIGH (ref 0–99)
Total CHOL/HDL Ratio: 2.9 Ratio
Triglycerides: 108 mg/dL (ref <150)
VLDL: 22 mg/dL (ref 0–40)

## 2013-01-30 LAB — GLUCOSE, CAPILLARY: Glucose-Capillary: 127 mg/dL — ABNORMAL HIGH (ref 70–99)

## 2013-01-30 MED ORDER — ALBUTEROL SULFATE (2.5 MG/3ML) 0.083% IN NEBU: 2.5000 mg | INHALATION_SOLUTION | RESPIRATORY_TRACT | Status: DC | PRN

## 2013-01-30 MED ORDER — ALBUTEROL SULFATE (2.5 MG/3ML) 0.083% IN NEBU
2.5000 mg | INHALATION_SOLUTION | Freq: Once | RESPIRATORY_TRACT | Status: AC
  Administered 2013-01-30: 2.5 mg via RESPIRATORY_TRACT

## 2013-01-30 MED ORDER — IPRATROPIUM BROMIDE 0.02 % IN SOLN
0.5000 mg | Freq: Once | RESPIRATORY_TRACT | Status: AC
  Administered 2013-01-30: 0.5 mg via RESPIRATORY_TRACT

## 2013-01-30 NOTE — Assessment & Plan Note (Addendum)
Assessment: Patient presents with one week duration of nonproductive cough and wheezing. She does not appear to be toxic. No tachypnea, SOB, desaturation, tachycardia. Physical exam revealed very mild scattered wheezes.   The clinical manifestation is consistent with mild Asthma Exacerbation which may be related to the initial upper respiratory vital infection.  No evidencess of PNA.    Peak flow 150  before Neb tx Peak flow  180 After Neb tx.     ( Note, she is unable to seal her mouth on the peakflow device and the above readings are likely underestimate her real peak flow)  Plan - Patient is given Albuterol/Atrovent Neb tx today - Will send the Albuterol Neb every 4-6 hours as needed  - will contact the [facility] for neb supplies. - Patient is asked to check her peak flow at home and bring the readings to the clinic in one week. - Patient is instructed to call the clinic or go to ED if she has fever, chills, worsening of wheezing/shortness of breath/cough/unable to breath. - patient understands the above plan.

## 2013-01-30 NOTE — Progress Notes (Signed)
Peak Flows: 1. 150  2. 140  3. 140 Duoneb given per Dr Nicoletta Dress. 1030AM - Peak Flows  1. 160  2. 180  3. 180.

## 2013-01-30 NOTE — Patient Instructions (Signed)
1. You are given Albuterol Neb tx today 2. Will start the Albuterol Neb every 4-6 hours as needed  3. will contact the [facility] for neb supplies. 4.  check peak flow at home and bring the readings to the clinic in one week. 5. call the clinic or go to ED if you have fever, chills, worsening of wheezing/shortness of breath/cough/unable to breath. 6. Follow up in one week.   Asthma, Acute Bronchospasm Acute bronchospasm caused by asthma is also referred to as an asthma attack. Bronchospasm means your air passages become narrowed. The narrowing is caused by inflammation and tightening of the muscles in the air tubes (bronchi) in your lungs. This can make it hard to breath or cause you to wheeze and cough. CAUSES Possible triggers are:  Animal dander from the skin, hair, or feathers of animals.  Dust mites contained in house dust.  Cockroaches.  Pollen from trees or grass.  Mold.  Cigarette or tobacco smoke.  Air pollutants such as dust, household cleaners, hair sprays, aerosol sprays, paint fumes, strong chemicals, or strong odors.  Cold air or weather changes. Cold air may trigger inflammation. Winds increase molds and pollens in the air.  Strong emotions such as crying or laughing hard.  Stress.  Certain medicines such as aspirin or beta-blockers.  Sulfites in foods and drinks, such as dried fruits and wine.  Infections or inflammatory conditions, such as a flu, cold, or inflammation of the nasal membranes (rhinitis).  Gastroesophageal reflux disease (GERD). GERD is a condition where stomach acid backs up into your throat (esophagus).  Exercise or strenuous activity. SIGNS AND SYMPTOMS   Wheezing.  Excessive coughing, particularly at night.  Chest tightness.  Shortness of breath. DIAGNOSIS  Your health care provider will ask you about your medical history and perform a physical exam. A chest X-ray or blood testing may be performed to look for other causes of your symptoms  or other conditions that may have triggered your asthma attack. TREATMENT  Treatment is aimed at reducing inflammation and opening up the airways in your lungs. Most asthma attacks are treated with inhaled medicines. These include quick relief or rescue medicines (such as bronchodilators) and controller medicines (such as inhaled corticosteroids). These medicines are sometimes given through an inhaler or a nebulizer. Systemic steroid medicine taken by mouth or given through an IV tube also can be used to reduce the inflammation when an attack is moderate or severe. Antibiotic medicines are only used if a bacterial infection is present.  HOME CARE INSTRUCTIONS   Rest.  Drink plenty of liquids. This helps the mucus to remain thin and be easily coughed up. Only use caffeine in moderation and do not use alcohol until you have recovered from your illness.  Do not smoke. Avoid being exposed to secondhand smoke.  You play a critical role in keeping yourself in good health. Avoid exposure to things that cause you to wheeze or to have breathing problems.  Keep your medicines up to date and available. Carefully follow your health care provider's treatment plan.  Take your medicine exactly as prescribed.  When pollen or pollution is bad, keep windows closed and use an air conditioner or go to places with air conditioning.  Asthma requires careful medical care. See your health care provider for a follow-up as advised. If you are more than [redacted] weeks pregnant and you were prescribed any new medicines, let your obstetrician know about the visit and how you are doing. Follow-up with your health  care provider as directed.  After you have recovered from your asthma attack, make an appointment with your outpatient doctor to talk about ways to reduce the likelihood of future attacks. If you do not have a doctor who manages your asthma, make an appointment with a primary care doctor to discuss your asthma. SEEK  IMMEDIATE MEDICAL CARE IF:   You are getting worse.  You have trouble breathing. If severe, call your local emergency services (911 in the [place]  You develop chest pain or discomfort.  You are vomiting.  You are not able to keep fluids down.  You are coughing up yellow, green, brown, or bloody sputum.  You have a fever and your symptoms suddenly get worse.  You have trouble swallowing. MAKE SURE YOU:   Understand these instructions.  Will watch your condition.  Will get help right away if you are not doing well or get worse. Document Released: 05/16/2006 Document Revised: 10/01/2012 Document Reviewed: 08/06/2012 [facility] Patient Information 2014 [place], [place].
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
## 2013-01-31 MED ORDER — ATORVASTATIN CALCIUM 40 MG PO TABS: 40.0000 mg | ORAL_TABLET | Freq: Every day | ORAL | Status: DC

## 2013-01-31 NOTE — Assessment & Plan Note (Signed)
Addendum Lipid Panel     Component Value Date/Time   CHOL 256* 01/30/2013 1035   TRIG 108 01/30/2013 1035   HDL 87 01/30/2013 1035   CHOLHDL 2.9 01/30/2013 1035   VLDL 22 01/30/2013 1035   LDLCALC 147* 01/30/2013 1035   - LDL is well controlled on Pravastatin.  She reports medical compliance. - will stop Pravastatin and start high potent Lipitor 40 mg po daily.  - I have called and discussed the plan with patient. She agrees with the plan.

## 2013-01-31 NOTE — Addendum Note (Signed)
Addended byNicoletta Dress, Darlisha Kelm on: 01/31/2013 07:25 PM   Modules accepted: Orders, Medications

## 2013-02-03 NOTE — Progress Notes (Signed)
Case discussed with Dr. Nicoletta Dress at time of visit.  We reviewed the resident's history and exam and pertinent patient test results.  I agree with the assessment, diagnosis, and plan of care documented in the resident's note.

## 2013-02-09 ENCOUNTER — Encounter: Payer: Self-pay | Admitting: Internal Medicine

## 2013-02-09 ENCOUNTER — Ambulatory Visit (INDEPENDENT_AMBULATORY_CARE_PROVIDER_SITE_OTHER): Payer: Medicaid Other | Admitting: Internal Medicine

## 2013-02-09 VITALS — BP 169/70 | HR 80 | Temp 97.6°F | Ht 63.0 in | Wt 245.1 lb

## 2013-02-09 DIAGNOSIS — E119 Type 2 diabetes mellitus without complications: Secondary | ICD-10-CM

## 2013-02-09 DIAGNOSIS — J45901 Unspecified asthma with (acute) exacerbation: Secondary | ICD-10-CM

## 2013-02-09 LAB — GLUCOSE, CAPILLARY: Glucose-Capillary: 158 mg/dL — ABNORMAL HIGH (ref 70–99)

## 2013-02-09 NOTE — Progress Notes (Signed)
   Subjective:    [name], female    DOB: 02-Jan-1951, 62 y.o.   MRN: [id number]  HPI [name] is a 62 y.o. female with a PMHx of DM II, HTN, asthma, who presents to clinic today  for f/u of acute asthma exacerbation 10 days ago whereby she was treated with Duonebs in clinic with improvement in her  Peak flows from 150 to 180 after treatment.  She was noted to have poor technique thus peakflow reading likely underestimated.  She was discharged homw with instructions to continue home nebulized albuterol and log peak flows. She states that she has greatly improved with using the neb treatments twice a day.  States that her peak flows are ~ 150 pre- and almost 200 post-treatment. Denies shortness of breath and wheezing.  Has not had to use her inhalers.  Denies fever, chest pain or dyspnea on exertion. Reports shortness of breath if she starts coughing. States that this exacerbation was caused by "a cold" and she otherwise never has "asthma attacks".  Review of Systems  Constitutional: Negative.   HENT: Negative.   Eyes: Negative.   Respiratory: Negative for cough, chest tightness, shortness of breath and wheezing.   Cardiovascular: Negative for chest pain.  Gastrointestinal: Negative.   Endocrine: Negative.   Genitourinary: Negative.   Allergic/Immunologic: Negative.   Neurological: Negative.   Hematological: Negative.   Psychiatric/Behavioral: Negative.        Objective:   Physical Exam  Constitutional: She is oriented to person, place, and time. She appears well-developed and well-nourished. No distress.  HENT:  Head: Normocephalic and atraumatic.  Eyes: Conjunctivae and EOM are normal. Pupils are equal, round, and reactive to light.  Neck: Neck supple.  Cardiovascular: Normal rate, regular rhythm and normal heart sounds.   Pulmonary/Chest: Effort normal and breath sounds normal. She has no wheezes. She has no rales.  Abdominal: Soft. Bowel sounds are  normal.  Neurological: She is alert and oriented to person, place, and time.  Skin: Skin is warm and dry.  Psychiatric: She has a normal mood and affect.          Assessment & Plan:  See separate problem-list charting:  #1 Acute Exacerbation Asthma: resolved, cont daily nebs and prn.  States that she feels she needs the nebs when she "gets to coughing". -on f/u Dr. Nicoletta Dress in Jan will address transition from neb treatment

## 2013-02-09 NOTE — Assessment & Plan Note (Signed)
Acute exac resolved.  Pt appears, improved with clear lung exam today.  Reports consistent pre- and post- peak flows (150-/200). Cont neb tx for now as pt with residual cough from URI. F/u Dr. Nicoletta Dress Jan 2015

## 2013-02-09 NOTE — Progress Notes (Signed)
Case discussed with Dr. Michail Sermon at time of visit.  We reviewed the resident's history and exam and pertinent patient test results.  I agree with the assessment, diagnosis, and plan of care documented in the resident's note.

## 2013-02-09 NOTE — Patient Instructions (Signed)
Continue your nebulized Albuterol and inhalers as instructed. Seek help at ED or Urgent Care for warning symptoms discussed. Follow-up with Dr. Nicoletta Dress in Jan and she will determine when you may stop the nebulized treatment, check your cholesterol, and conduct your Pap Smear.

## 2013-03-12 ENCOUNTER — Ambulatory Visit (INDEPENDENT_AMBULATORY_CARE_PROVIDER_SITE_OTHER): Payer: Medicaid Other | Admitting: Internal Medicine

## 2013-03-12 VITALS — BP 140/62 | HR 66 | Temp 97.6°F | Ht 63.0 in | Wt 235.9 lb

## 2013-03-12 DIAGNOSIS — J069 Acute upper respiratory infection, unspecified: Secondary | ICD-10-CM | POA: Insufficient documentation

## 2013-03-12 DIAGNOSIS — E119 Type 2 diabetes mellitus without complications: Secondary | ICD-10-CM

## 2013-03-12 LAB — CBC WITH DIFFERENTIAL/PLATELET
BASOS ABS: 0 10*3/uL (ref 0.0–0.1)
Basophils Relative: 1 % (ref 0–1)
Eosinophils Absolute: 0.2 10*3/uL (ref 0.0–0.7)
Eosinophils Relative: 4 % (ref 0–5)
HCT: 38.2 % (ref 36.0–46.0)
Hemoglobin: 12.6 g/dL (ref 12.0–15.0)
Lymphocytes Relative: 38 % (ref 12–46)
Lymphs Abs: 2.4 10*3/uL (ref 0.7–4.0)
MCH: 28.1 pg (ref 26.0–34.0)
MCHC: 33 g/dL (ref 30.0–36.0)
MCV: 85.3 fL (ref 78.0–100.0)
Monocytes Absolute: 0.4 10*3/uL (ref 0.1–1.0)
Monocytes Relative: 6 % (ref 3–12)
NEUTROS ABS: 3.3 10*3/uL (ref 1.7–7.7)
NEUTROS PCT: 51 % (ref 43–77)
Platelets: 276 10*3/uL (ref 150–400)
RBC: 4.48 MIL/uL (ref 3.87–5.11)
RDW: 14.7 % (ref 11.5–15.5)
WBC: 6.3 10*3/uL (ref 4.0–10.5)

## 2013-03-12 LAB — COMPLETE METABOLIC PANEL WITH GFR
ALBUMIN: 4 g/dL (ref 3.5–5.2)
ALT: 16 U/L (ref 0–35)
AST: 15 U/L (ref 0–37)
Alkaline Phosphatase: 67 U/L (ref 39–117)
BUN: 31 mg/dL — ABNORMAL HIGH (ref 6–23)
CO2: 24 meq/L (ref 19–32)
Calcium: 9.3 mg/dL (ref 8.4–10.5)
Chloride: 103 mEq/L (ref 96–112)
Creat: 1.05 mg/dL (ref 0.50–1.10)
GFR, Est African American: 65 mL/min
GFR, Est Non African American: 56 mL/min — ABNORMAL LOW
Glucose, Bld: 155 mg/dL — ABNORMAL HIGH (ref 70–99)
Potassium: 4.5 mEq/L (ref 3.5–5.3)
SODIUM: 137 meq/L (ref 135–145)
TOTAL PROTEIN: 7.6 g/dL (ref 6.0–8.3)
Total Bilirubin: 0.4 mg/dL (ref 0.2–1.2)

## 2013-03-12 LAB — GLUCOSE, CAPILLARY: Glucose-Capillary: 156 mg/dL — ABNORMAL HIGH (ref 70–99)

## 2013-03-12 LAB — TSH: TSH: 1.653 u[IU]/mL (ref 0.350–4.500)

## 2013-03-12 MED ORDER — HYDROCOD POLST-CHLORPHEN POLST 10-8 MG/5ML PO LQCR: 5.0000 mL | Freq: Two times a day (BID) | ORAL | Status: DC | PRN

## 2013-03-12 NOTE — Assessment & Plan Note (Signed)
Assessment:   The clinical manifestation is suggestive of viral URI. No indication of acute asthma exacerbation. She does not requires increased treatment of her Neb and inhaler, and her peak flow is at baseline.   Plan: - symptomatic treatment: Tussionex 5 ml po q12 h as needed for cough.  - continue home ned treatment. - home measures of URI is discussed with patient. Written information given to pt.  - call the clinic if she does not feel better or develops fever, chills, worsening of cough/wheezing/other symptoms.
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
## 2013-03-12 NOTE — Assessment & Plan Note (Signed)
Lab Results  Component Value Date   HGBA1C 7.9 01/13/2013   HGBA1C 7.5 10/02/2012   HGBA1C 7.5 06/26/2012     Assessment: Diabetes control: fair control Progress toward A1C goal:  unchanged Reports medical compliance.   Plan: Medications:  continue current medications Home glucose monitoring: Frequency:   Timing:   Instruction/counseling given: reminded to get eye exam, reminded to bring blood glucose meter & log to each visit, reminded to bring medications to each visit, discussed foot care and discussed the need for weight loss Educational resources provided:   Self management tools provided:   Other plans:   Given her age, the goal of A1C could be between 7.0-7.9. She admits dietary indiscretion. I think that she would benefit from DM education once she is recovered from current illness. Will continue current therapy and follow up in one month.   Routine labs obtained today.

## 2013-03-12 NOTE — Patient Instructions (Signed)
-   will give Tussionex 5 ml po q12 h as needed for cough.  - continue home neb treatment. - call the clinic if you do not feel better or develops fever, chills, worsening of cough/wheezing/other symptoms.    Upper Respiratory Infection, Adult An upper respiratory infection (URI) is also known as the common cold. It is often caused by a type of germ (virus). Colds are easily spread (contagious). You can pass it to others by kissing, coughing, sneezing, or drinking out of the same glass. Usually, you get better in 1 or 2 weeks.  HOME CARE   Only take medicine as told by your doctor.  Use a warm mist humidifier or breathe in steam from a hot shower.  Drink enough water and fluids to keep your pee (urine) clear or pale yellow.  Get plenty of rest.  Return to work when your temperature is back to normal or as told by your doctor. You may use a face mask and wash your hands to stop your cold from spreading. GET HELP RIGHT AWAY IF:   After the first few days, you feel you are getting worse.  You have questions about your medicine.  You have chills, shortness of breath, or brown or red spit (mucus).  You have yellow or brown snot (nasal discharge) or pain in the face, especially when you bend forward.  You have a fever, puffy (swollen) neck, pain when you swallow, or white spots in the back of your throat.  You have a bad headache, ear pain, sinus pain, or chest pain.  You have a high-pitched whistling sound when you breathe in and out (wheezing).  You have a lasting cough or cough up blood.  You have sore muscles or a stiff neck. MAKE SURE YOU:   Understand these instructions.  Will watch your condition.  Will get help right away if you are not doing well or get worse. Document Released: 07/18/2007 Document Revised: 04/23/2011 Document Reviewed: 06/05/2010 [facility] Patient Information 2014 [place], [place].

## 2013-03-13 ENCOUNTER — Telehealth: Payer: Self-pay | Admitting: *Deleted

## 2013-03-13 LAB — MICROALBUMIN / CREATININE URINE RATIO
Creatinine, Urine: 68.8 mg/dL
MICROALB UR: 21.29 mg/dL — AB (ref 0.00–1.89)
Microalb Creat Ratio: 309.4 mg/g — ABNORMAL HIGH (ref 0.0–30.0)

## 2013-03-13 NOTE — Telephone Encounter (Signed)
Call from pt - states her insurance will not cover rx written yesterday for Tussionex w/codeine. Requesting something else.  Thanks

## 2013-03-16 NOTE — Progress Notes (Signed)
Case discussed with Dr. Li soon after the resident saw the patient. We reviewed the resident's history and exam and pertinent patient test results. I agree with the assessment, diagnosis, and plan of care documented in the resident's note. 

## 2013-03-16 NOTE — Telephone Encounter (Signed)
I called patient who states that she feels much better and is almost over her last URI. I discussed with her that she probably does not need this cough med by now. And she agrees.  Dr. Nicoletta Dress

## 2013-04-08 NOTE — Addendum Note (Signed)
Addended by: Orson Gear on: 04/08/2013 10:10 AM   Modules accepted: Orders

## 2013-04-09 ENCOUNTER — Encounter: Payer: Medicaid Other | Admitting: Internal Medicine

## 2013-04-10 ENCOUNTER — Ambulatory Visit: Payer: Medicaid Other | Admitting: Podiatry

## 2013-04-13 ENCOUNTER — Other Ambulatory Visit: Payer: Self-pay | Admitting: Internal Medicine

## 2013-04-15 ENCOUNTER — Encounter: Payer: Self-pay | Admitting: Podiatry

## 2013-04-15 ENCOUNTER — Ambulatory Visit (INDEPENDENT_AMBULATORY_CARE_PROVIDER_SITE_OTHER): Payer: Medicaid Other | Admitting: Podiatry

## 2013-04-15 VITALS — BP 156/61 | HR 64 | Ht 63.0 in | Wt 235.0 lb

## 2013-04-15 DIAGNOSIS — B351 Tinea unguium: Secondary | ICD-10-CM

## 2013-04-15 DIAGNOSIS — M79606 Pain in leg, unspecified: Secondary | ICD-10-CM

## 2013-04-15 DIAGNOSIS — M79609 Pain in unspecified limb: Secondary | ICD-10-CM

## 2013-04-15 DIAGNOSIS — E119 Type 2 diabetes mellitus without complications: Secondary | ICD-10-CM

## 2013-04-15 NOTE — Progress Notes (Signed)
Subjective:  63 y.o. year old female patient presents complaining of painful nails and calluses. Patient requests toe nails, corns and calluses trimmed.  Patient stated that her blood sugar was up while she was in hospital for difficulty breathing, but now it is under control.   Patient Summary List & History reviewed for allergies, medications, medical problems and surgical history.  Objective: Dermatologic: Thick hypertrophic dystrophic nails x 10.  Painful plantar callus under 2nd and 3rd MPJ bilateral.  Vascular:  Findings reveal warm, good capillary refill and no trophic changes or ulcerative lesions  normal DP and PT pulses and no trophic changes or ulcerative lesions  Orthopedic: Rectus foot without gross deformities.  Neurologic:  Gait normal. Reflexes normal and symmetric. Sensation grossly normal   Assessment:  Dystrophic mycotic nails x 10.  Plantar painful calluses bilateral.   Treatment: All mycotic nails, corns, calluses debrided.

## 2013-04-15 NOTE — Patient Instructions (Signed)
Seen for hypertrophic painful nails and calluses. All nails and calluses debrided. Return in 3 months or as needed.

## 2013-04-17 ENCOUNTER — Other Ambulatory Visit: Payer: Self-pay | Admitting: *Deleted

## 2013-04-17 DIAGNOSIS — M25561 Pain in right knee: Secondary | ICD-10-CM

## 2013-04-17 NOTE — Telephone Encounter (Signed)
C/o knee pain.

## 2013-04-20 MED ORDER — HYDROCODONE-ACETAMINOPHEN 5-325 MG PO TABS: 1.0000 | ORAL_TABLET | Freq: Four times a day (QID) | ORAL | Status: DC | PRN

## 2013-04-20 NOTE — Telephone Encounter (Signed)
Rx ready for pick up; states daughter -in-law, Cattie Ellard, will pick it up.

## 2013-05-14 ENCOUNTER — Other Ambulatory Visit: Payer: Self-pay | Admitting: Internal Medicine

## 2013-05-14 DIAGNOSIS — J45909 Unspecified asthma, uncomplicated: Secondary | ICD-10-CM

## 2013-06-13 ENCOUNTER — Other Ambulatory Visit: Payer: Self-pay | Admitting: Internal Medicine

## 2013-07-17 ENCOUNTER — Ambulatory Visit: Payer: Medicaid Other | Admitting: Podiatry

## 2013-07-17 ENCOUNTER — Other Ambulatory Visit: Payer: Self-pay | Admitting: Internal Medicine

## 2013-07-17 NOTE — Telephone Encounter (Signed)
Called into pharmacy

## 2013-07-17 NOTE — Telephone Encounter (Signed)
Please schedule an appointment in [facility] within 1 month.

## 2013-07-17 NOTE — Telephone Encounter (Signed)
Rx called in to pharmacy. Hilda Blades Kyrillos Adams RN 07/17/13 11AM

## 2013-07-17 NOTE — Telephone Encounter (Signed)
Rx called in to pharmacy. 

## 2013-07-24 ENCOUNTER — Ambulatory Visit (INDEPENDENT_AMBULATORY_CARE_PROVIDER_SITE_OTHER): Payer: Medicaid Other | Admitting: Podiatry

## 2013-07-24 ENCOUNTER — Encounter: Payer: Self-pay | Admitting: Podiatry

## 2013-07-24 VITALS — BP 166/66 | HR 79

## 2013-07-24 DIAGNOSIS — E119 Type 2 diabetes mellitus without complications: Secondary | ICD-10-CM

## 2013-07-24 DIAGNOSIS — B351 Tinea unguium: Secondary | ICD-10-CM

## 2013-07-24 DIAGNOSIS — M79609 Pain in unspecified limb: Secondary | ICD-10-CM

## 2013-07-24 DIAGNOSIS — E1169 Type 2 diabetes mellitus with other specified complication: Secondary | ICD-10-CM

## 2013-07-24 DIAGNOSIS — E118 Type 2 diabetes mellitus with unspecified complications: Secondary | ICD-10-CM

## 2013-07-24 DIAGNOSIS — M79606 Pain in leg, unspecified: Secondary | ICD-10-CM

## 2013-07-24 NOTE — Patient Instructions (Signed)
Seen for diabetic foot care. All nails and calluses under the big joint trimmed. May benefit from Diabetic shoes. Return in 3 month.

## 2013-07-24 NOTE — Progress Notes (Signed)
Subjective:  63 y.o. year old female patient presents complaining of painful nails and calluses. Patient requests toe nails, corns and calluses trimmed.  Patient stated that her blood sugar was up in 140-130 range.   Objective: Dermatologic: Thick hypertrophic dystrophic nails x 10.  Painful plantar callus under the first MPJ bilateral.  Vascular:  Findings reveal warm, good capillary refill and no trophic changes or ulcerative lesions  normal DP and PT pulses and no trophic changes or ulcerative lesions  Orthopedic: Rectus foot without gross deformities in none weight bearing. Possible plantar flexed first Metatarsal bilateral.  Neurologic:  Gait normal. Reflexes normal and symmetric. Sensation grossly normal.  Assessment:  Dystrophic mycotic nails x 10.  Plantar painful calluses bilateral.   Treatment: All mycotic nails, corns, calluses debrided. Return in 3 months.

## 2013-08-28 ENCOUNTER — Encounter: Payer: Self-pay | Admitting: Internal Medicine

## 2013-08-28 ENCOUNTER — Ambulatory Visit (INDEPENDENT_AMBULATORY_CARE_PROVIDER_SITE_OTHER): Payer: Medicaid Other | Admitting: Internal Medicine

## 2013-08-28 VITALS — BP 158/64 | HR 62 | Temp 97.5°F | Wt 236.5 lb

## 2013-08-28 DIAGNOSIS — J4520 Mild intermittent asthma, uncomplicated: Secondary | ICD-10-CM

## 2013-08-28 DIAGNOSIS — M25569 Pain in unspecified knee: Secondary | ICD-10-CM

## 2013-08-28 DIAGNOSIS — J45909 Unspecified asthma, uncomplicated: Secondary | ICD-10-CM

## 2013-08-28 DIAGNOSIS — G4700 Insomnia, unspecified: Secondary | ICD-10-CM

## 2013-08-28 DIAGNOSIS — M25561 Pain in right knee: Secondary | ICD-10-CM

## 2013-08-28 DIAGNOSIS — E119 Type 2 diabetes mellitus without complications: Secondary | ICD-10-CM

## 2013-08-28 DIAGNOSIS — F32A Depression, unspecified: Secondary | ICD-10-CM | POA: Insufficient documentation

## 2013-08-28 DIAGNOSIS — F3289 Other specified depressive episodes: Secondary | ICD-10-CM

## 2013-08-28 DIAGNOSIS — Z79899 Other long term (current) drug therapy: Secondary | ICD-10-CM | POA: Insufficient documentation

## 2013-08-28 DIAGNOSIS — Z0000 Encounter for general adult medical examination without abnormal findings: Secondary | ICD-10-CM

## 2013-08-28 DIAGNOSIS — F329 Major depressive disorder, single episode, unspecified: Secondary | ICD-10-CM

## 2013-08-28 DIAGNOSIS — I10 Essential (primary) hypertension: Secondary | ICD-10-CM

## 2013-08-28 DIAGNOSIS — E785 Hyperlipidemia, unspecified: Secondary | ICD-10-CM

## 2013-08-28 LAB — POCT GLYCOSYLATED HEMOGLOBIN (HGB A1C): HEMOGLOBIN A1C: 7.6

## 2013-08-28 LAB — GLUCOSE, CAPILLARY: Glucose-Capillary: 191 mg/dL — ABNORMAL HIGH (ref 70–99)

## 2013-08-28 MED ORDER — MELATONIN 1 MG PO TABS: 1.0000 mg | ORAL_TABLET | Freq: Every evening | ORAL | Status: DC | PRN

## 2013-08-28 MED ORDER — HYDROCODONE-ACETAMINOPHEN 5-325 MG PO TABS: 1.0000 | ORAL_TABLET | Freq: Four times a day (QID) | ORAL | Status: DC | PRN

## 2013-08-28 MED ORDER — FLUOXETINE HCL 20 MG PO CAPS: ORAL_CAPSULE | ORAL | Status: DC

## 2013-08-28 NOTE — Assessment & Plan Note (Signed)
BP Readings from Last 3 Encounters:  08/28/13 158/64  07/24/13 166/66  04/15/13 156/61    Lab Results  Component Value Date   NA 137 03/12/2013   K 4.5 03/12/2013   CREATININE 1.05 03/12/2013    Assessment: Blood pressure control: mildly elevated Progress toward BP goal:  deteriorated Comments: Has been under a lot of stress recently and reports poor diet.  She is on high doses of all of her blood pressure medication and thinks she can improve her diet and exercise.  Plan: Medications:  continue current medications: amlodipine 10 mg, atenolol 100 mg, Hyzaar 100-25 mg daily Educational resources provided: brochure Self management tools provided: home blood pressure logbook Other plans: Try to improve BP with diet and exercise for now.  Discussed with patient the importance of controlling her stress and eating healthy.

## 2013-08-28 NOTE — Progress Notes (Signed)
Subjective:    [name], female    DOB: 07/28/1950, 63 y.o.   MRN: [id number]  HPI Comments: Chelsea [name] is a 63 year old woman with a history of DM2 with retinopathy, HTN, HLD, and asthma who presents to the clinic for routine follow up.  She has not been feeling like her normal self.  She reports being under a lot of stress the last two months.  She has been trying to take care of all of her family members, and she has been only getting 3 hours of sleep per night due to trouble getting to sleep.  In addition, she found out her best friend died this morning.  She had been in good spirits a couple of months ago after going to a talking group with a Social worker.  She stopped taking her Prozac two months ago because she didn't need it.  She hasn't been able to go to her counseling groups for the last month because she has been busy, but she is planning on starting to go again.  She forgot her glucometer today, but she reports that her blood sugars have been poorly controlled with lots of reading in the 160s and 180s.  She thinks this is related to the increased stress she has been experiencing.  She thinks her diet had gotten worse due to the stress.  She is trying to eat healthier meals and exercise. She is going to schedule her annual eye exam on Monday.  She goes to [facility] to get her feet examined, and she is schedules in August.  She says her asthma has been well-controlled. She last used her albuterol nebulizer last week.  She doesn't need her inhaler currently.  Her asthma is triggered by the heat.  She denies any night awakenings due to her asthma.  She hasn't been hospitalized for her asthma since she was very young, and she was never intubated.  She has osteoarthritis in her knees and previously has had steroid injections and arthroscopic surgery in the past.  She was told that she may need to get this knee replaced in the future, but she has been able to control the pain  with Vicodin 5-325 mg.  She was written for three times a day, but she takes one pill per day on average.  She doesn't measure her blood pressure at home, but she thinks it is elevated due to stress and the weather.  She is due for her pap smear today, but she doesn't feel up for it today.  She would like to do it in 3 months.  Diabetes Pertinent negatives for diabetes include no fatigue.      Review of Systems  Constitutional: Positive for activity change and appetite change. Negative for fever, chills and fatigue.  HENT: Negative for congestion and rhinorrhea.   Eyes: Negative for visual disturbance.  Respiratory: Negative for apnea, cough, choking and wheezing.   Gastrointestinal: Negative for nausea, vomiting, diarrhea, constipation and blood in stool.  Genitourinary: Negative for hematuria and difficulty urinating.  Musculoskeletal: Positive for arthralgias (Right knee.). Negative for myalgias.  Skin: Negative for rash.  Psychiatric/Behavioral: Positive for dysphoric mood.       Objective:   Physical Exam  Constitutional: She is oriented to person, place, and time. She appears well-developed and well-nourished. No distress.  HENT:  Head: Normocephalic and atraumatic.  Mouth/Throat: Oropharynx is clear and moist.  Eyes: Conjunctivae and EOM are normal. Pupils are equal, round, and reactive  to light. No scleral icterus.  Neck: Normal range of motion. Neck supple.  Cardiovascular: Normal rate, regular rhythm, normal heart sounds and intact distal pulses.   Pulmonary/Chest: Effort normal and breath sounds normal.  Abdominal: Soft. She exhibits no distension. There is no tenderness.  Musculoskeletal: She exhibits edema (1+ pitting edema to mid shin bilaterally.).  Walks with cane due to pain in right knee.  Neurological: She is alert and oriented to person, place, and time. No cranial nerve deficit.  Skin: Skin is warm and dry. No rash noted. She is not diaphoretic.    Psychiatric:  Teary during interview.          Assessment & Plan:  Please see problem-based assessment and plan.

## 2013-08-28 NOTE — Assessment & Plan Note (Signed)
Patient is due for pap smear today and should be vaccinated with PPSV 23.  She would rather not get the pap smear today because she doesn't think she can handle it due to her mood.  She says that she doesn't believe in vaccinations.  I talked to her about the benefits of being vaccinated to prevent infections in the future.  She says she will think more about it and wants to discuss it at her next appointment. -Return to clinic in 3 months for pap smear. -Discuss PPSV 23 vaccine at that time.

## 2013-08-28 NOTE — Assessment & Plan Note (Signed)
Patient reports being sad for the last month due several stressors in her life.  She has taken Prozac 20 mg daily in the past with good effect and is willing to try restarting.  In addition, she thinks her group counseling sessions are very helpful and will make an effect to start attending again.  She has been having trouble getting to sleep at night due to the stress she has been under and would like something to help.  I would rather not start her on a benzodiazepine or potentially addictive sleep medication at this time because it sounds like an acute problem that will resolve. -Restart Prozac 20 mg daily. -Melatonin 1 mg QHS PRN. -Encourage patient to make sure to go to her counseling sessions.

## 2013-08-28 NOTE — Assessment & Plan Note (Signed)
Lab Results  Component Value Date   HGBA1C 7.6 08/28/2013   HGBA1C 7.9 01/13/2013   HGBA1C 7.5 10/02/2012     Assessment: Diabetes control: fair control Progress toward A1C goal:  improved Comments: Poor diet and stress last couple of months, trying to improve.  No glucometer today. She thinks she can lower her A1C with diet and exercise.  Plan: Medications:  continue current medications: Insulin 70/30 mix 20 units am, 20 units pm. Metformin 1000 mg BID. Home glucose monitoring: Frequency: 2 times a day Timing: before breakfast;at bedtime Instruction/counseling given: reminded to get eye exam, reminded to bring blood glucose meter & log to each visit, reminded to bring medications to each visit, discussed foot care, discussed the need for weight loss and discussed diet Educational resources provided: brochure Self management tools provided: home glucose logbook Other plans: Follow up in 3 months to make sure diet and exercise interventions are taking place, BMP at that time.

## 2013-08-28 NOTE — Assessment & Plan Note (Signed)
Last lipid panel on 01/31/13 with LDL of 147, switched to Lipitor from pravastatin. -Continue Lipitor 40 mg daily. -Recheck lipid panel in 12/15.

## 2013-08-28 NOTE — Assessment & Plan Note (Signed)
Patient has right knee pain due to arthritis and has been told she may need to get a knee replacement in the future.  She is not currently interested in surgery.  She cannot take NSAIDs due to an allergic reaction, so she has been managed on Vicodin 5-325 mg TID PRN.  She is generally written for 90 tablets per month, but only needs refills every 3 months.  She was last written in 3/15, and she says she is out of her medication. -Refilled Vicodin 5-325 mg TID PRN for right knee pain #90 tabs

## 2013-08-28 NOTE — Patient Instructions (Signed)
Thank you for coming to clinic today Chelsea [name].  General instructions: -Hang in there.  I know this is a tough time, but things will get better. -Restart your Prozac 20 mg every day to help with your stress during this tough time.  -Take a tablet of melatonin to help you get to sleep at night.  Let me know if you continue to have trouble sleeping. -Try your best to go to your counseling group, this will help a lot to deal with the stress you have been going through. -Your blood pressure was elevated today.  This may be due to the stress you have been experiencing.  Get back to eating a healthy diet and exercising as much as you can to lower your blood pressure. -Make sure to schedule your eye exam and foot exam. -Please make a follow up appointment to return to clinic in 3 months, so I can see how you are doing.  Please bring your medicines with you each time you come.   Medicines may be  Eye drops  Herbal   Vitamins  Pills  Seeing these help [place] take care of you.  Melatonin oral capsules and tablets What is this medicine? MELATONIN (mel uh TOH nin) is a dietary supplement. It is promoted to help maintain normal sleep patterns. The FDA has not approved this supplement for any medical use. This supplement may be used for other purposes; ask your health care provider or pharmacist if you have questions. This medicine may be used for other purposes; ask your health care provider or pharmacist if you have questions. COMMON BRAND NAME(S): Melatonex What should I tell my health care provider before I take this medicine? They need to know if you have any of these conditions: -cancer -if you frequently drink alcohol containing drinks -immune system problems -liver disease -seizure disorder -an unusual or allergic reaction to melatonin, other medicines, foods, dyes, or preservatives -pregnant or trying to get pregnant -breast-feeding How should I use this medicine? Take this  supplement by mouth with a glass of water. This supplement is usually taken prior to bedtime. Do not chew or crush most tablets or capsules. Some tablets are chewable and are chewed before swallowing. Some tablets are meant to be dissolved in the mouth or under the tongue. Follow the directions on the package labeling, or take as directed by your health care professional. Do not take this supplement more often than directed. Talk to your pediatrician regarding the use of this supplement in children. This supplement is not recommended for use in children. Overdosage: If you think you have taken too much of this medicine contact a poison control center or emergency room at once. NOTE: This medicine is only for you. Do not share this medicine with others. What if I miss a dose? This does not apply; this medicine is not for regular use. Do not take double or extra doses. What may interact with this medicine? Check with your doctor or healthcare professional if you are taking any of the following medications: -hormone medicines -medicines for blood pressure like nifedipine -medications for anxiety, depression, or other emotional or psychiatric problems -medications for seizures -medications for sleep -other herbal or dietary supplements -tamoxifen -treatments for cancer or immune disorders This list may not describe all possible interactions. Give your health care provider a list of all the medicines, herbs, non-prescription drugs, or dietary supplements you use. Also tell them if you smoke, drink alcohol, or use illegal drugs. Some items may  interact with your medicine. What should I watch for while using this medicine? See your doctor if your symptoms do not get better or if they get worse. Do not take this supplement for more than 2 weeks unless your doctor tells you to. You may get drowsy or dizzy. Do not drive, use machinery, or do anything that needs mental alertness until you know how this  medicine affects you. Do not stand or sit up quickly, especially if you are an older patient. This reduces the risk of dizzy or fainting spells. Alcohol may interfere with the effect of this medicine. Avoid alcoholic drinks. Talk to your doctor before you use this supplement if you are currently being treated for an emotional, mental, or sleep problem. This medicine may interfere with your treatment. Herbal or dietary supplements are not regulated like medicines. Rigid quality control standards are not required for dietary supplements. The purity and strength of these products can vary. The safety and effect of this dietary supplement for a certain disease or illness is not well known. This product is not intended to diagnose, treat, cure or prevent any disease. The Food and Drug Administration suggests the following to help consumers protect themselves: -Always read product labels and follow directions. -Natural does not mean a product is safe for humans to take. -Look for products that include USP after the ingredient name. This means that the manufacturer followed the standards of the [place] Pharmacopoeia. -Supplements made or sold by a nationally known food or drug company are more likely to be made under tight controls. You can write to the company for more information about how the product was made. What side effects may I notice from receiving this medicine? Side effects that you should report to your doctor or health care professional as soon as possible: -allergic reactions like skin rash, itching or hives, swelling of the face, lips, or tongue -breathing problems -confusion, forgetful -depressed, nervous, or other mood changes -fast or pounding heartbeat -trouble staying awake or alert during the day Side effects that usually do not require medical attention (report to your doctor or health care professional if they continue or are bothersome): -drowsiness,  dizziness -headache -nightmares -upset stomach This list may not describe all possible side effects. Call your doctor for medical advice about side effects. You may report side effects to FDA at 1-800-FDA-1088. Where should I keep my medicine? Keep out of the reach of children. Store at room temperature or as directed on the package label. Protect from moisture. Throw away any unused supplement after the expiration date. NOTE: This sheet is a summary. It may not cover all possible information. If you have questions about this medicine, talk to your doctor, pharmacist, or health care provider.  2015, Elsevier/Gold Standard. (2012-12-12 18:36:27)

## 2013-08-28 NOTE — Assessment & Plan Note (Signed)
Patient's asthma is well-controlled currently, uses nebulizer but not inhaler.  Triggered by heat. -Discussed avoiding triggers such as being outside on hot days. -Continue albuterol nebulizer PRN. -Albuterol inhaler available for acute flares when nebulizer not available.

## 2013-08-30 NOTE — Progress Notes (Signed)
INTERNAL MEDICINE TEACHING ATTENDING ADDENDUM - Aldine Contes, MD: I personally saw and evaluated Ms. Dolinski in this clinic visit in conjunction with the resident, Dr. Trudee Kuster. I have discussed patient's plan of care with medical resident during this visit. I have confirmed the physical exam findings and have read and agree with the clinic note including the plan with the following addition: - On exam: Cardio- RRR, normal heart sounds, Lungs: CTA b/l - Patient with mildly elevated BP - Likely stress induced. Advised diet and exercise for now - Patient with depression - restart Prozac. Start melatonin for insomnia - Continue with pain control for knee pain. May need surgical intervention in the future

## 2013-09-08 ENCOUNTER — Other Ambulatory Visit: Payer: Self-pay | Admitting: Internal Medicine

## 2013-09-08 DIAGNOSIS — Z1231 Encounter for screening mammogram for malignant neoplasm of breast: Secondary | ICD-10-CM

## 2013-09-18 ENCOUNTER — Ambulatory Visit (HOSPITAL_COMMUNITY)
Admission: RE | Admit: 2013-09-18 | Discharge: 2013-09-18 | Disposition: A | Discharge status: Home / Self Care | Payer: Medicaid Other | Source: Ambulatory Visit | Attending: Internal Medicine | Admitting: Internal Medicine

## 2013-09-18 DIAGNOSIS — Z1231 Encounter for screening mammogram for malignant neoplasm of breast: Secondary | ICD-10-CM

## 2013-10-05 LAB — HM DIABETES EYE EXAM

## 2013-10-23 ENCOUNTER — Ambulatory Visit: Payer: Medicaid Other | Admitting: Podiatry

## 2013-11-05 ENCOUNTER — Other Ambulatory Visit: Payer: Self-pay | Admitting: *Deleted

## 2013-11-06 MED ORDER — LOSARTAN POTASSIUM-HCTZ 100-25 MG PO TABS: ORAL_TABLET | ORAL | Status: DC

## 2013-11-06 MED ORDER — METFORMIN HCL 1000 MG PO TABS: ORAL_TABLET | ORAL | Status: DC

## 2013-11-06 NOTE — Telephone Encounter (Signed)
pls sch Nov appt with Dr Trudee Kuster - he has openings

## 2013-11-06 NOTE — Telephone Encounter (Signed)
Pt aware both Rx at pharmacy and front desk pool aware pt needs appt Nov Dr Trudee Kuster per Dr Lynnae January.

## 2013-11-20 ENCOUNTER — Other Ambulatory Visit: Payer: Self-pay | Admitting: *Deleted

## 2013-11-20 MED ORDER — GLUCOSE BLOOD VI STRP: ORAL_STRIP | Status: DC

## 2013-11-20 NOTE — Telephone Encounter (Signed)
Pt states she will be out by the weekend; 2 strips left.

## 2013-12-03 ENCOUNTER — Other Ambulatory Visit: Payer: Self-pay | Admitting: Internal Medicine

## 2013-12-25 ENCOUNTER — Encounter: Payer: Self-pay | Admitting: Internal Medicine

## 2013-12-25 ENCOUNTER — Other Ambulatory Visit (HOSPITAL_COMMUNITY)
Admission: RE | Admit: 2013-12-25 | Discharge: 2013-12-25 | Disposition: A | Discharge status: Home / Self Care | Payer: Medicare Other | Source: Ambulatory Visit | Attending: Internal Medicine | Admitting: Internal Medicine

## 2013-12-25 ENCOUNTER — Ambulatory Visit (INDEPENDENT_AMBULATORY_CARE_PROVIDER_SITE_OTHER): Payer: Medicare Other | Admitting: Internal Medicine

## 2013-12-25 VITALS — BP 177/67 | HR 59 | Temp 97.9°F | Wt 229.7 lb

## 2013-12-25 DIAGNOSIS — I152 Hypertension secondary to endocrine disorders: Secondary | ICD-10-CM

## 2013-12-25 DIAGNOSIS — M25561 Pain in right knee: Secondary | ICD-10-CM

## 2013-12-25 DIAGNOSIS — Z0000 Encounter for general adult medical examination without abnormal findings: Secondary | ICD-10-CM

## 2013-12-25 DIAGNOSIS — E669 Obesity, unspecified: Secondary | ICD-10-CM

## 2013-12-25 DIAGNOSIS — Z124 Encounter for screening for malignant neoplasm of cervix: Secondary | ICD-10-CM

## 2013-12-25 DIAGNOSIS — E11339 Type 2 diabetes mellitus with moderate nonproliferative diabetic retinopathy without macular edema: Secondary | ICD-10-CM | POA: Diagnosis not present

## 2013-12-25 DIAGNOSIS — Z01419 Encounter for gynecological examination (general) (routine) without abnormal findings: Secondary | ICD-10-CM | POA: Insufficient documentation

## 2013-12-25 DIAGNOSIS — F329 Major depressive disorder, single episode, unspecified: Secondary | ICD-10-CM | POA: Diagnosis not present

## 2013-12-25 DIAGNOSIS — F32A Depression, unspecified: Secondary | ICD-10-CM

## 2013-12-25 DIAGNOSIS — E1169 Type 2 diabetes mellitus with other specified complication: Secondary | ICD-10-CM

## 2013-12-25 DIAGNOSIS — E119 Type 2 diabetes mellitus without complications: Secondary | ICD-10-CM

## 2013-12-25 LAB — BASIC METABOLIC PANEL WITH GFR
BUN: 24 mg/dL — AB (ref 6–23)
CALCIUM: 9.5 mg/dL (ref 8.4–10.5)
CO2: 19 meq/L (ref 19–32)
CREATININE: 1.05 mg/dL (ref 0.50–1.10)
Chloride: 105 mEq/L (ref 96–112)
GFR, EST AFRICAN AMERICAN: 64 mL/min
GFR, Est Non African American: 56 mL/min — ABNORMAL LOW
GLUCOSE: 110 mg/dL — AB (ref 70–99)
Potassium: 4.5 mEq/L (ref 3.5–5.3)
Sodium: 140 mEq/L (ref 135–145)

## 2013-12-25 LAB — POCT GLYCOSYLATED HEMOGLOBIN (HGB A1C): Hemoglobin A1C: 8.7

## 2013-12-25 LAB — LIPID PANEL
Cholesterol: 257 mg/dL — ABNORMAL HIGH (ref 0–200)
HDL: 76 mg/dL (ref >39)
LDL CALC: 145 mg/dL — AB (ref 0–99)
Total CHOL/HDL Ratio: 3.4 Ratio
Triglycerides: 180 mg/dL — ABNORMAL HIGH (ref <150)
VLDL: 36 mg/dL (ref 0–40)

## 2013-12-25 LAB — GLUCOSE, CAPILLARY: GLUCOSE-CAPILLARY: 84 mg/dL (ref 70–99)

## 2013-12-25 MED ORDER — HYDROCODONE-ACETAMINOPHEN 5-325 MG PO TABS: 1.0000 | ORAL_TABLET | Freq: Four times a day (QID) | ORAL | Status: DC | PRN

## 2013-12-25 MED ORDER — METFORMIN HCL ER 500 MG PO TB24: 2000.0000 mg | ORAL_TABLET | Freq: Every day | ORAL | Status: DC

## 2013-12-25 NOTE — Assessment & Plan Note (Addendum)
Mood significantly improved after going to group therapy.  This was likely triggered by stress at home, and it sounds like she is doing a good job of working on her support system.  Prozac unlikely to be causing hyperglycemia, but does not appear to be needed currently. -Encouraged continued participation in group therapy. -Discontinue Prozac. -Continue melatonin PRN for insomnia.  Consider discontinuing in the future if remains hypertensive.

## 2013-12-25 NOTE — Patient Instructions (Addendum)
Thank you for coming to clinic today Chelsea [name].  General instructions: -Start taking your metformin again.  It should help if you take this after meals to make it have less of an effect on your stomach.  I also changed to a different form that is easier on your stomach. -Keep working on [employer] and exercise, we may need to increase your insulin at your next visit. -We think it would be a good idea for your to get vaccinations to prevent the flu and pneumonia, please consider getting these at your next appointment. -Please make a follow up appointment to return to clinic in 6 months.  Please try to bring all your medicines next time. This helps [place] take good care of you and stops mistakes from medicines that could hurt you.

## 2013-12-25 NOTE — Assessment & Plan Note (Signed)
Due for PAP smear today.  Given her age and lack of history of positive PAP smears, it would be reasonable for this to be her last screening if it is negative.  She denies any vaccines. -PAP smear performed today. -Discussed benefit of vaccinations, which she says she will consider in the future.

## 2013-12-25 NOTE — Assessment & Plan Note (Signed)
BP Readings from Last 3 Encounters:  12/25/13 177/67  08/28/13 158/64  07/24/13 166/66    Lab Results  Component Value Date   NA 137 03/12/2013   K 4.5 03/12/2013   CREATININE 1.05 03/12/2013    Assessment: Blood pressure control: moderately elevated Progress toward BP goal:  deteriorated Comments: Patient reports poor diet and exercise and is motivated to improve.  She is reluctant to add another medication at this point.  Melatonin can cause hypertension, but she says that she only takes this occasionally.  Plan: Medications:  continue current medications, amlodipine 10 mg dialy, atenolol 100 mg daily, and Hyzaar 100-25 mg daily. Other plans: She will work on her diet and try to exercise more frequently. Can consider stopping melatonin at next visit or adding another medication such as hydralazine.

## 2013-12-25 NOTE — Assessment & Plan Note (Addendum)
Lab Results  Component Value Date   HGBA1C 8.7 12/25/2013   HGBA1C 7.6 08/28/2013   HGBA1C 7.9 01/13/2013     Assessment: Diabetes control: fair control Progress toward A1C goal:  deteriorated Comments: Had not been taking metformin due to diarrhea, also under a lot of stress recently that appears to be resolving.  Plan: Medications:  Switch to Glucophage XR 2000 mg daily to decrease diarrhea, take after meal.  Continue 70/30 25 units in AM and 20 units in PM. Home glucose monitoring: Frequency: once a day Timing: before breakfast Instruction/counseling given: reminded to get eye exam, reminded to bring blood glucose meter & log to each visit, reminded to bring medications to each visit, discussed foot care, discussed the need for weight loss and discussed diet Self management tools provided: copy of home glucose meter download (Simultaneous filing. User may not have seen previous data.) Other plans: Continue to work on diet and exercise as she feels confident that she can lower her A1C.  Will reassess glucose control after she has restarted metformin.  Check lipid panel and BMP today.

## 2013-12-25 NOTE — Progress Notes (Signed)
   Subjective:    [name], female    DOB: December 19, 1950, 63 y.o.   MRN: [id number]  HPI Chelsea [name] is a 63 year old woman with history of DM2 with retinopathy, HTN, HLD, arthritis, and asthma who present today for pap smear and routine follow-up.  She says that her mood has been much improved since her last visit.  She initially tried the Prozac that I prescribed, but she felt that it was increasing her blood sugar, so she stopped taking it. She has been going to group therapy regularly and thinks it has dramatically improved her mood.  In addition, she has been working on decreasing stress by delegating some of the responsibilities in her life.  She had been spending a lot of time caring for her brother, but he recently got a nurse aide to help with his care.  Her blood sugars have been running high, but she has not been taking her metformin because it has been causing diarrhea.  We discussed lifestyle modifications at her last visit, but she has been unable to make them due to stresses.  She fells confident that she will be able to improve her diet and start exercising.  She reports getting opthalmology and foot exams in the last couple of months.  Review of Systems  Constitutional: Negative for fever, activity change and appetite change.  HENT: Negative for congestion and rhinorrhea.   Eyes: Negative for visual disturbance.  Respiratory: Negative for cough, shortness of breath and wheezing.   Cardiovascular: Negative for chest pain and palpitations.  Gastrointestinal: Negative for diarrhea and abdominal distention.  Endocrine: Negative for polydipsia and polyuria.  Genitourinary: Negative for frequency, hematuria and difficulty urinating.  Musculoskeletal: Positive for arthralgias. Negative for myalgias and joint swelling.  Skin: Negative for rash.  Neurological: Negative for dizziness, syncope and numbness.  Psychiatric/Behavioral: Negative for dysphoric mood and  agitation.       Objective:   Physical Exam  Constitutional: She is oriented to person, place, and time. She appears well-developed and well-nourished. No distress.  HENT:  Head: Normocephalic and atraumatic.  Eyes: Conjunctivae are normal. Pupils are equal, round, and reactive to light. No scleral icterus.  Neck: Normal range of motion. Neck supple.  Cardiovascular: Normal rate, regular rhythm and normal heart sounds.   Pulmonary/Chest: Effort normal and breath sounds normal. No respiratory distress. She has no wheezes.  Abdominal: Soft. Bowel sounds are normal. She exhibits no distension. There is no tenderness.  Genitourinary: Vagina normal and uterus normal. No vaginal discharge found.  Musculoskeletal: She exhibits no edema or tenderness.  Pain with weight-bearing and movement of R knee.  Neurological: She is alert and oriented to person, place, and time. No cranial nerve deficit. She exhibits normal muscle tone.  Skin: Skin is warm and dry. No rash noted. No erythema.  Psychiatric: She has a normal mood and affect. Her behavior is normal.          Assessment & Plan:  Please see problem-based assessment and plan.

## 2013-12-25 NOTE — Progress Notes (Signed)
[name], female   DOB: Aug 11, 1950, 63 y.o.   MRN: [id number] [facility] Attending  I saw and evaluated the patient.  I personally confirmed the key portions of the history and exam documented by Dr. Trudee Kuster and I reviewed pertinent patient test results.  The assessment, diagnosis, and plan were formulated together and I agree with the documentation in the resident's note.

## 2013-12-25 NOTE — Assessment & Plan Note (Signed)
Currently prescribed Vicodin 5-325 q6h PRN for her knee pain due to arthritis and meniscus injury.  90 tabs last her a month and allow her to complete her normal every day activities.  She may need to get a knee replacement in the future. -Refill Vicodin.

## 2013-12-30 LAB — CYTOLOGY - PAP

## 2014-01-01 ENCOUNTER — Other Ambulatory Visit: Payer: Self-pay | Admitting: *Deleted

## 2014-01-01 MED ORDER — INSULIN PEN NEEDLE 31G X 8 MM MISC: Status: DC

## 2014-01-22 ENCOUNTER — Other Ambulatory Visit: Payer: Self-pay | Admitting: *Deleted

## 2014-01-22 MED ORDER — LOSARTAN POTASSIUM-HCTZ 100-25 MG PO TABS: ORAL_TABLET | ORAL | Status: DC

## 2014-01-25 ENCOUNTER — Telehealth: Payer: Self-pay | Admitting: *Deleted

## 2014-01-25 NOTE — Telephone Encounter (Signed)
Received PA request from pt's pharmacy. Losartan/HCTZ 100-25mg  once daily #30. Medication is preferred after failure of ACE. Per pt's chart, she developed cough while on lisinopril and it is now listed on pt's allergy list. Attempted to submit request online via [place], but request was "suspended" (needed further review). Regenia Skeeter, Kynnadi Dicenso Cassady12/14/20153:24 PM   Confirmation [id number]

## 2014-01-25 NOTE — Telephone Encounter (Signed)
  APPROVED 01/25/2014 - 02/24/2014 DMA  Pharmacy and pt aware.Despina Hidden Cassady12/14/20154:22 PM

## 2014-01-29 ENCOUNTER — Other Ambulatory Visit: Payer: Self-pay | Admitting: *Deleted

## 2014-01-29 MED ORDER — INSULIN ASPART PROT & ASPART (70-30 MIX) 100 UNIT/ML PEN: PEN_INJECTOR | SUBCUTANEOUS | Status: DC

## 2014-01-29 MED ORDER — INSULIN PEN NEEDLE 31G X 8 MM MISC: Status: DC

## 2014-02-22 ENCOUNTER — Other Ambulatory Visit: Payer: Self-pay | Admitting: *Deleted

## 2014-02-22 DIAGNOSIS — E785 Hyperlipidemia, unspecified: Secondary | ICD-10-CM

## 2014-02-22 MED ORDER — ATORVASTATIN CALCIUM 40 MG PO TABS: 40.0000 mg | ORAL_TABLET | Freq: Every day | ORAL | Status: DC

## 2014-03-02 ENCOUNTER — Other Ambulatory Visit: Payer: Self-pay | Admitting: *Deleted

## 2014-03-02 MED ORDER — GLUCOSE BLOOD VI STRP: ORAL_STRIP | Status: DC

## 2014-03-03 ENCOUNTER — Other Ambulatory Visit: Payer: Self-pay | Admitting: *Deleted

## 2014-03-03 DIAGNOSIS — E11319 Type 2 diabetes mellitus with unspecified diabetic retinopathy without macular edema: Secondary | ICD-10-CM

## 2014-03-03 MED ORDER — GLUCOSE BLOOD VI STRP: ORAL_STRIP | Status: DC

## 2014-03-03 NOTE — Telephone Encounter (Signed)
Medicare part b is not accepting dr Vivia Budge npi #, please resend with your info

## 2014-04-02 ENCOUNTER — Telehealth: Payer: Self-pay | Admitting: *Deleted

## 2014-04-02 NOTE — Telephone Encounter (Signed)
Pt called unable to afford CBG strips for meter. Suggest to talk with Debera Lat. Hilda Blades Login M[place]oy RN 04/02/14 4:30PM

## 2014-04-05 ENCOUNTER — Telehealth: Payer: Self-pay | Admitting: Dietician

## 2014-04-05 DIAGNOSIS — E11319 Type 2 diabetes mellitus with unspecified diabetic retinopathy without macular edema: Secondary | ICD-10-CM

## 2014-04-05 MED ORDER — GLUCOSE BLOOD VI STRP: ORAL_STRIP | Status: DC

## 2014-04-05 MED ORDER — ACCU-CHEK SOFTCLIX LANCETS MISC: Status: DC

## 2014-04-05 NOTE — Telephone Encounter (Signed)
Now has [employer] and medicaid, cannot get her testing supplies covered at [employer]. Suggested she try [facility] mail order. She is to call them after we send Rx to see how much they will cost her and call us back if not acceptable. Unable to check her sugar so not taking insulin as directed. She is upset and would like them ASAP.

## 2014-04-05 NOTE — Telephone Encounter (Signed)
I signed the prescriptions.

## 2014-04-06 NOTE — Telephone Encounter (Signed)
[place] is only her medicine and does not cover her testing supplies.  ( must be part D only- she has not been in so I cannot see her cards) . CDE called [facility] they do not fill Medicare part B, so they transferred her testing supply Rxs to [place] at  [phone] . CDE called [facility] at [place]: Medicare is not recognizing the doctor ([place]). Pharmacy will call Medicare nad see if she can get the doctor to be put through and if she cannot she will send a fax back to [place] requesting an Rx from another doctor.

## 2014-04-12 ENCOUNTER — Other Ambulatory Visit: Payer: Self-pay | Admitting: *Deleted

## 2014-04-12 MED ORDER — ATENOLOL 100 MG PO TABS: 100.0000 mg | ORAL_TABLET | Freq: Every day | ORAL | Status: DC

## 2014-04-16 NOTE — Telephone Encounter (Signed)
[name] Plyler talked with pt 04/06/14.

## 2014-05-20 DIAGNOSIS — E119 Type 2 diabetes mellitus without complications: Secondary | ICD-10-CM | POA: Diagnosis not present

## 2014-05-20 DIAGNOSIS — B351 Tinea unguium: Secondary | ICD-10-CM | POA: Diagnosis not present

## 2014-05-20 DIAGNOSIS — M79675 Pain in left toe(s): Secondary | ICD-10-CM | POA: Diagnosis not present

## 2014-05-20 DIAGNOSIS — M79674 Pain in right toe(s): Secondary | ICD-10-CM | POA: Diagnosis not present

## 2014-05-31 DIAGNOSIS — F332 Major depressive disorder, recurrent severe without psychotic features: Secondary | ICD-10-CM | POA: Diagnosis not present

## 2014-06-07 DIAGNOSIS — F332 Major depressive disorder, recurrent severe without psychotic features: Secondary | ICD-10-CM | POA: Diagnosis not present

## 2014-06-11 ENCOUNTER — Encounter: Payer: Self-pay | Admitting: *Deleted

## 2014-06-11 DIAGNOSIS — F332 Major depressive disorder, recurrent severe without psychotic features: Secondary | ICD-10-CM | POA: Diagnosis not present

## 2014-06-14 DIAGNOSIS — F332 Major depressive disorder, recurrent severe without psychotic features: Secondary | ICD-10-CM | POA: Diagnosis not present

## 2014-06-18 DIAGNOSIS — F332 Major depressive disorder, recurrent severe without psychotic features: Secondary | ICD-10-CM | POA: Diagnosis not present

## 2014-06-21 DIAGNOSIS — F332 Major depressive disorder, recurrent severe without psychotic features: Secondary | ICD-10-CM | POA: Diagnosis not present

## 2014-06-25 ENCOUNTER — Ambulatory Visit (INDEPENDENT_AMBULATORY_CARE_PROVIDER_SITE_OTHER): Payer: Medicare Other | Admitting: Internal Medicine

## 2014-06-25 ENCOUNTER — Encounter: Payer: Self-pay | Admitting: Internal Medicine

## 2014-06-25 VITALS — BP 148/55 | HR 62 | Temp 97.9°F | Ht 63.0 in | Wt 225.8 lb

## 2014-06-25 DIAGNOSIS — E11319 Type 2 diabetes mellitus with unspecified diabetic retinopathy without macular edema: Secondary | ICD-10-CM | POA: Diagnosis not present

## 2014-06-25 DIAGNOSIS — T383X5A Adverse effect of insulin and oral hypoglycemic [antidiabetic] drugs, initial encounter: Secondary | ICD-10-CM

## 2014-06-25 DIAGNOSIS — Z794 Long term (current) use of insulin: Secondary | ICD-10-CM | POA: Diagnosis not present

## 2014-06-25 DIAGNOSIS — F329 Major depressive disorder, single episode, unspecified: Secondary | ICD-10-CM | POA: Diagnosis not present

## 2014-06-25 DIAGNOSIS — J302 Other seasonal allergic rhinitis: Secondary | ICD-10-CM

## 2014-06-25 DIAGNOSIS — K521 Toxic gastroenteritis and colitis: Secondary | ICD-10-CM

## 2014-06-25 DIAGNOSIS — E669 Obesity, unspecified: Secondary | ICD-10-CM

## 2014-06-25 DIAGNOSIS — I10 Essential (primary) hypertension: Secondary | ICD-10-CM

## 2014-06-25 DIAGNOSIS — E785 Hyperlipidemia, unspecified: Secondary | ICD-10-CM

## 2014-06-25 DIAGNOSIS — J309 Allergic rhinitis, unspecified: Secondary | ICD-10-CM

## 2014-06-25 DIAGNOSIS — R2689 Other abnormalities of gait and mobility: Secondary | ICD-10-CM

## 2014-06-25 DIAGNOSIS — Z0000 Encounter for general adult medical examination without abnormal findings: Secondary | ICD-10-CM

## 2014-06-25 DIAGNOSIS — F32A Depression, unspecified: Secondary | ICD-10-CM

## 2014-06-25 DIAGNOSIS — F332 Major depressive disorder, recurrent severe without psychotic features: Secondary | ICD-10-CM | POA: Diagnosis not present

## 2014-06-25 DIAGNOSIS — E1165 Type 2 diabetes mellitus with hyperglycemia: Secondary | ICD-10-CM

## 2014-06-25 DIAGNOSIS — M8580 Other specified disorders of bone density and structure, unspecified site: Secondary | ICD-10-CM | POA: Insufficient documentation

## 2014-06-25 DIAGNOSIS — M1990 Unspecified osteoarthritis, unspecified site: Secondary | ICD-10-CM

## 2014-06-25 DIAGNOSIS — E1169 Type 2 diabetes mellitus with other specified complication: Secondary | ICD-10-CM

## 2014-06-25 DIAGNOSIS — M25561 Pain in right knee: Secondary | ICD-10-CM

## 2014-06-25 DIAGNOSIS — J301 Allergic rhinitis due to pollen: Secondary | ICD-10-CM

## 2014-06-25 DIAGNOSIS — J4520 Mild intermittent asthma, uncomplicated: Secondary | ICD-10-CM

## 2014-06-25 DIAGNOSIS — Z13820 Encounter for screening for osteoporosis: Secondary | ICD-10-CM

## 2014-06-25 DIAGNOSIS — M1711 Unilateral primary osteoarthritis, right knee: Secondary | ICD-10-CM

## 2014-06-25 LAB — POCT GLYCOSYLATED HEMOGLOBIN (HGB A1C): Hemoglobin A1C: 9.4

## 2014-06-25 LAB — GLUCOSE, CAPILLARY: Glucose-Capillary: 103 mg/dL — ABNORMAL HIGH (ref 65–99)

## 2014-06-25 MED ORDER — FLUTICASONE PROPIONATE 50 MCG/ACT NA SUSP: NASAL | Status: DC

## 2014-06-25 MED ORDER — INSULIN ASPART PROT & ASPART (70-30 MIX) 100 UNIT/ML PEN: 25.0000 [IU] | PEN_INJECTOR | Freq: Two times a day (BID) | SUBCUTANEOUS | Status: DC

## 2014-06-25 MED ORDER — ATORVASTATIN CALCIUM 80 MG PO TABS: 80.0000 mg | ORAL_TABLET | Freq: Every day | ORAL | Status: DC

## 2014-06-25 MED ORDER — METFORMIN HCL ER 500 MG PO TB24: 1000.0000 mg | ORAL_TABLET | Freq: Every day | ORAL | Status: DC

## 2014-06-25 MED ORDER — NAPHAZOLINE-PHENIRAMINE 0.025-0.3 % OP SOLN: 1.0000 [drp] | Freq: Four times a day (QID) | OPHTHALMIC | Status: DC | PRN

## 2014-06-25 MED ORDER — ACETAMINOPHEN 325 MG PO TABS: 650.0000 mg | ORAL_TABLET | Freq: Four times a day (QID) | ORAL | Status: DC | PRN

## 2014-06-25 NOTE — Patient Instructions (Addendum)
Thank you for coming to clinic today Chelsea [name].  General instructions: -I would like you to try a lower dose of metformin 1000 mg daily to see if this reduces your diarrhea. -Increase your insulin to 30 units in the morning and 25 units at night. Keep a close eye on your sugars. I will have [name] call in and check in on you to make further modifications. -Increase your atorvastatin to 80 mg every day. -You can use Visine eyedrops to help with your eye itching and tears. You can also try some cold compresses. -We will schedule for a DEXA scan and contact you at the time. -Instead of Vicodin, you can take Tylenol 650 mg every 6 hours as needed for your pain. -I will check into getting you a home health aide. -Please make a follow up appointment to return to clinic in 2 months.  Thank you for bringing your medicines today. This helps [place] keep you safe from mistakes.

## 2014-06-25 NOTE — Progress Notes (Signed)
   Subjective:    [name], female    DOB: 10/25/1950, 64 y.o.   MRN: [id number]  HPI Chelsea [name] is a 64 year old woman with history of type 2 diabetes with retinopathy, hypertension, hyperlipidemia, arthritis, and asthma who presents for follow-up of her chronic medical problems.  At her last visit, her blood sugars were running high due to not taking her metformin. We also discussed diet and lifestyle modifications to help with her blood sugar control.  She reports continued diarrhea after using metformin, but she reports keeping up with it.  She reports that her brother passed away about two months ago.  She had been very involved in his care.  She reports her mood has been good, and she has been continuing to go to her group therapy sessions that have helped with her mood.  She is asking about getting some help at home.  She reports being worried about getting all of her work done around her house.  She feels unsteady bathing and making meals, and she has difficulty getting in and out of the bath tub and cooking meals on her own.  She reports having runny and itchy eyes due to her allergies.   Review of Systems  Constitutional: Negative for fever, chills and fatigue.  HENT: Negative for congestion, rhinorrhea and sore throat.   Eyes: Positive for discharge and itching.  Respiratory: Negative for apnea, cough and shortness of breath.   Cardiovascular: Negative for chest pain, palpitations and leg swelling.  Gastrointestinal: Negative for nausea, vomiting, abdominal pain, diarrhea and constipation.  Endocrine: Negative for polydipsia and polyuria.  Genitourinary: Negative for dysuria and difficulty urinating.  Musculoskeletal: Positive for arthralgias (right knee pain). Negative for myalgias, back pain and neck stiffness.  Skin: Negative for rash.  Neurological: Negative for dizziness, weakness and numbness.       Objective:   Physical Exam  Constitutional:  She is oriented to person, place, and time. She appears well-developed and well-nourished. No distress.  Obese.  HENT:  Head: Normocephalic and atraumatic.  Eyes: Conjunctivae and EOM are normal. Pupils are equal, round, and reactive to light. No scleral icterus.  Neck: Normal range of motion. Neck supple.  Cardiovascular: Normal rate, regular rhythm and normal heart sounds.   Pulmonary/Chest: Effort normal and breath sounds normal. No respiratory distress.  Abdominal: Soft. Bowel sounds are normal. She exhibits no distension. There is no tenderness.  Musculoskeletal: She exhibits no edema.  Pain with weightbearing and right knee associated with decreased range of motion. Unable to get up on the exam table due to pain and decreased mobility.  Neurological: She is alert and oriented to person, place, and time. No cranial nerve deficit. She exhibits normal muscle tone.  Unsteady gait.  Skin: Skin is warm and dry. No rash noted. She is not diaphoretic. No erythema.  Psychiatric: She has a normal mood and affect.       Assessment & Plan:  Please see problem-based assessment and plan.

## 2014-06-27 NOTE — Assessment & Plan Note (Signed)
Her mood is currently stable, and she appears to be handling the loss of her brother well. She denies the need for an antidepressant currently. -Continue group therapy sessions.

## 2014-06-27 NOTE — Assessment & Plan Note (Addendum)
BP Readings from Last 3 Encounters:  06/25/14 148/55  12/25/13 177/67  08/28/13 158/64    Lab Results  Component Value Date   NA 140 12/25/2013   K 4.5 12/25/2013   CREATININE 1.05 12/25/2013    Assessment: Blood pressure control: controlled Progress toward BP goal:  at goal Comments: Goal less than 150/90.  Plan: Medications:  continue current medications: Amlodipine 10 mg daily, atenolol 100 mg daily, and Hyzaar 100-25 milligrams daily.

## 2014-06-27 NOTE — Assessment & Plan Note (Signed)
Lab Results  Component Value Date   HGBA1C 9.4 06/25/2014   HGBA1C 8.7 12/25/2013   HGBA1C 7.6 08/28/2013     Assessment: Diabetes control: poor control (HgbA1C >9%) Progress toward A1C goal:  deteriorated Comments: Having diarrhea from metformin, no low blood sugar readings on glucometer.  Plan: Medications:  Increase NovoLog 70/30-30 units every morning, 25 units every evening with meals. Reduce metformin XR to 1000 mg daily to see if this improves her diarrhea. Home glucose monitoring: Frequency: 2 times a day Timing: before breakfast, at bedtime Instruction/counseling given: reminded to get eye exam, reminded to bring blood glucose meter & log to each visit, reminded to bring medications to each visit, discussed the need for weight loss and discussed diet Educational resources provided: handout Self management tools provided: copy of home glucose meter download Other plans: Asked [name] to call patient next week and increase her insulin dose to 35 units in the AM and 25 units in the evening if required.

## 2014-06-27 NOTE — Assessment & Plan Note (Signed)
Patient's chart was reviewed by the geriatric review committee clinic. She appears to be filling her medications appropriately. Only medication on [facility] list is Vicodin. -Patient refusing pneumonia vaccine. She will consider, and this can be readdressed with her new PCP. -Ordered a DEXA scan for osteoporosis screening.

## 2014-06-27 NOTE — Assessment & Plan Note (Signed)
The patient's eye symptoms are most consistent with allergic conjunctivitis, and she does have a history of allergic rhinitis. -Asked patient to try Visine-A eyedrops to help her symptoms. -Refilled Flonase.

## 2014-06-27 NOTE — Assessment & Plan Note (Addendum)
The patient is currently prescribed Vicodin 5-325 milligrams every 6 hours when necessary, but she has not had this prescription filled since November. She reports rarely using this medication. However, she is willing to try taking Tylenol alone instead to see if this controls her pain. She is very limited in terms of her mobility, and she has an unsteady gait. -Stop Vicodin. -Tylenol 650 mg every 6 hours when necessary. -Sent a note to [facility] regarding setting up a home health aide.

## 2014-06-27 NOTE — Assessment & Plan Note (Signed)
Her LDL was elevated at 145 on her most recent lipid panel in November. She has never been on a higher dose of atorvastatin, and she is tolerating the lower dose well. -Increase atorvastatin to 80 mg daily.

## 2014-06-28 ENCOUNTER — Encounter: Payer: Self-pay | Admitting: Licensed Clinical Social Worker

## 2014-06-28 DIAGNOSIS — F332 Major depressive disorder, recurrent severe without psychotic features: Secondary | ICD-10-CM | POA: Diagnosis not present

## 2014-06-28 NOTE — Progress Notes (Signed)
During pt's office visit on 5/13, pt requesting assistance with her ADL's.  CSW initiated [facility] request and placed in PCP mailbox.

## 2014-06-29 NOTE — Progress Notes (Signed)
Internal Medicine Clinic Attending  Case discussed with Dr. Moding at the time of the visit.  We reviewed the resident's history and exam and pertinent patient test results.  I agree with the assessment, diagnosis, and plan of care documented in the resident's note. 

## 2014-06-30 ENCOUNTER — Telehealth: Payer: Self-pay | Admitting: Dietician

## 2014-06-30 NOTE — Telephone Encounter (Signed)
Asked to call patient by Dr. Trudee Kuster who wants CDE to Obtain blood sugars and adjust insulin if high.  She reports taking her 70/30 insulin to 30 units in the morning before breakfast and 20 units in the evening before dinner.  No dizziness, symptoms of low blood sugar, she feels well. Cannot see in meter, has only written down a few readings and did not want to "mess with it" (her meter) to obtain readings . She could only give me three readings after saying they are in the 200s:  today before breakfast 195 Yesterday before dinner 140 yesterday before breakfast 165   Her information is not reliable enough to adjust insulin over the phone. CDE scheduled an appointment with her for next Wednesday to bring her meter and medicines with her so we can download her meter.

## 2014-07-02 DIAGNOSIS — F332 Major depressive disorder, recurrent severe without psychotic features: Secondary | ICD-10-CM | POA: Diagnosis not present

## 2014-07-05 DIAGNOSIS — F332 Major depressive disorder, recurrent severe without psychotic features: Secondary | ICD-10-CM | POA: Diagnosis not present

## 2014-07-07 ENCOUNTER — Ambulatory Visit (INDEPENDENT_AMBULATORY_CARE_PROVIDER_SITE_OTHER): Payer: Medicare Other | Admitting: Dietician

## 2014-07-07 ENCOUNTER — Encounter: Payer: Self-pay | Admitting: Dietician

## 2014-07-07 DIAGNOSIS — E1165 Type 2 diabetes mellitus with hyperglycemia: Secondary | ICD-10-CM

## 2014-07-07 DIAGNOSIS — Z713 Dietary counseling and surveillance: Secondary | ICD-10-CM | POA: Diagnosis not present

## 2014-07-07 DIAGNOSIS — Z794 Long term (current) use of insulin: Secondary | ICD-10-CM | POA: Diagnosis not present

## 2014-07-07 DIAGNOSIS — E11319 Type 2 diabetes mellitus with unspecified diabetic retinopathy without macular edema: Secondary | ICD-10-CM

## 2014-07-07 NOTE — Patient Instructions (Signed)
Please try to eat about 3 carb choices at each meal and 1 for snacks    Examples:  2 starch + 1 fruit = 3 carb choices    1 starch + 1 milk + 1 fruit = 3 carb choices    2 starch + 1 milk = 3 carb choices    3 starches = 3 carb choices  Veggies and meats do not count- add them as needed.   [name] [phone]

## 2014-07-07 NOTE — Progress Notes (Signed)
Medical Nutrition Therapy:  Appt start time: 0921 end time:  1010.  Assessment:  Primary concerns today: blood sugar control Chelsea [name] says her diabetes got out of control due to her caring for her family and other and not herself. She recently had several deaths in her family and is now attending a weekly support group that she finds very helpful in helping her take care of herself and say "NO".  Good self monitoring technique observed today. Patient was a bit upset over weight gain but when explained that it may be due to improved blood sugars, she seemed to be okay with it. However, she wished to avoid further weight gain while imrpoving her diabetes control.   Preferred Learning Style: Auditory, Visual,Hands on Learning Readiness: Ready and Change in progress  MEDICATIONS: verbalized correct administration of Novolog Mix 70/30 30 units before breakfast and 25 units before supper WEIGHT: 230# increased 5# from last visit, has had slow weight gain of 50# over last 8 years from starting weight of 180# BLOOD SUGARS: meter downloaded: blood sugars improved. Average fell from 278 before insulin change to 226 for past 2 weeks. Lowest CBG was today after breakfast 81 since insulin change-highest in past 2 weeks 365. Checks blood sugar 1.6 times a day. last A1C 9.4% = average CBG of 223 DIETARY INTAKE: Usual eating pattern includes 3 meals and 3 snacks per day..   24-hr recall:  B ( AM): water, broccoli, a handful of cheerios and a little milk  Snk ( AM): peppermint or fruit cup  L ( PM): sandwich and chips, water Snk ( PM): fruit D ( PM): baked chicken or fish sticks, potatoes or rice, corn or broccoli Snk ( PM): crackers and peanut butter Beverages: unsweetened tea, diet soda, water, coffee  PHYSICAL ACTIVITY: not discussed today  Estimated energy needs: 1700-1800  calories 200 g carbohydrates    Progress Towards Goal(s):  In progress.   Nutritional Diagnosis:  NB-1.1 Food and  nutrition-related knowledge deficit As related to lack of sufficient prior diabetes training.  As evidenced by her report of needing review and after her review feeling more confident. .    Intervention:  Nutrition education about basic carb counting. Emphasizing consistency and portions.  Teaching Method Utilized: Visual, Auditory, Hands on Handouts given during visit include: what can I eat, picture examples of 1 carb serving, meal plan for 3 carbs at meals and 1 for snacks = ~ 180 grams carb/day Barriers to learning/adherence to lifestyle change: financial and emotional support Demonstrated degree of understanding via:  Teach Back, says she wants to do better with her blood sugars and now she feels more confident that she can.   Monitoring/Evaluation:  Dietary intake, exercise, meter, and body weight in 3 week(s).

## 2014-07-09 DIAGNOSIS — F332 Major depressive disorder, recurrent severe without psychotic features: Secondary | ICD-10-CM | POA: Diagnosis not present

## 2014-07-13 DIAGNOSIS — F332 Major depressive disorder, recurrent severe without psychotic features: Secondary | ICD-10-CM | POA: Diagnosis not present

## 2014-07-16 DIAGNOSIS — F332 Major depressive disorder, recurrent severe without psychotic features: Secondary | ICD-10-CM | POA: Diagnosis not present

## 2014-07-19 DIAGNOSIS — F332 Major depressive disorder, recurrent severe without psychotic features: Secondary | ICD-10-CM | POA: Diagnosis not present

## 2014-07-23 DIAGNOSIS — F332 Major depressive disorder, recurrent severe without psychotic features: Secondary | ICD-10-CM | POA: Diagnosis not present

## 2014-07-28 ENCOUNTER — Ambulatory Visit (INDEPENDENT_AMBULATORY_CARE_PROVIDER_SITE_OTHER): Payer: Medicare Other | Admitting: Dietician

## 2014-07-28 DIAGNOSIS — E119 Type 2 diabetes mellitus without complications: Secondary | ICD-10-CM

## 2014-07-28 DIAGNOSIS — Z713 Dietary counseling and surveillance: Secondary | ICD-10-CM

## 2014-07-28 DIAGNOSIS — E11319 Type 2 diabetes mellitus with unspecified diabetic retinopathy without macular edema: Secondary | ICD-10-CM

## 2014-07-28 DIAGNOSIS — Z794 Long term (current) use of insulin: Secondary | ICD-10-CM | POA: Diagnosis not present

## 2014-07-28 NOTE — Progress Notes (Addendum)
Medical Nutrition Therapy:  Appt start time: 0935 end time:  1019.  Assessment:  Primary concerns today: blood sugar control [name] is here for her 1st follow up visit  with her fiance, Dominica Severin. Her blood sugars are improved, she was able to demonstrate using food models her meal plan of 3 carb servings for meal snad 1 carb serving for snacks. Suggested lower penetration number for self monitoring technique. She says she is tired of sticking herself" with insulin twice and blood sugars twice a day.  Her stated blood sugar goal is 130-150 before meals.   Preferred Learning Style: Auditory, Visual,Hands on Learning Readiness: Ready and Change in progress  MEDICATIONS: Novolog Mix 70/30 30 units before breakfast and 25 units before supper WEIGHT: 235# increased 5# from last visit, has had slow weight gain of 50# over last 8 years from starting weight of 180# BLOOD SUGARS: meter downloaded: blood sugars improved. Average fell from 238 before MNT to 192 for past 3 weeks. Lowest CBG was 81 and she reports an undocumented low at [employer] while walking and drank small coke to bring it up, highest CBG was 309. Her fastings are above target, but need to know bedtime reading before considering an increase in PM insulin DIETARY INTAKE: Usual eating pattern includes 3 meals and 3 snacks per day..   24-hr recall:  B ( AM): egg, cereal with 1% milk, 1/2 banana  Snk ( AM):  fruit cup  L ( PM): hamburger with 1 slice bread, 1/2 ear of corn, fruit, water Snk ( PM): 2-4 crackers with peanut butter D ( PM): ~3/4 cup pasta with Ragu, salad,   Snk ( PM): fruit Beverages: unsweetened tea, diet soda, water, coffee  PHYSICAL ACTIVITY: walks at [place] and in house tries to be busy and push herself  Estimated energy needs: 1700-1800  calories 200 g carbohydrates   Progress Towards Goal(s):  In progress.   Nutritional Diagnosis:  NB-1.1 Food and nutrition-related knowledge deficit As related to lack of  sufficient prior diabetes training improving  As evidenced by her report of wanting to learn now and  her feeling more confident. .    Intervention:  Reinforcemet of basic carb counting education. Taught adjustments for activity to prevent additional weight gain and hypoglycemia.  Coordinaion of care- options for improved control are VGo, janumet. continued diet and exercise therapy.   Teaching Method Utilized: Visual, Auditory, Hands on Handouts given during visit include: carb adjustment for exercise Barriers to learning/adherence to lifestyle change: financial  Demonstrated degree of understanding via:  Teach Back, used food models to demo her meal plan.   Monitoring/Evaluation:  Dietary intake, exercise, meter, and body weight in 3 week(s).

## 2014-07-28 NOTE — Patient Instructions (Signed)
You are doing a great job eating 3 carb servings per meal and 1 serving per snack.   Your blood sugars are much better because you are doing this.   You can try reducing your insulin by 5- 10 units if you know you are going to exercise. ( 5 units for short exercise, 10 units for longer)   Or eat 1-3 servings of carb for unplanned exercise.   Your goal is for blood sugars to be 130-150 before meals

## 2014-08-09 DIAGNOSIS — F332 Major depressive disorder, recurrent severe without psychotic features: Secondary | ICD-10-CM | POA: Diagnosis not present

## 2014-08-11 ENCOUNTER — Ambulatory Visit (INDEPENDENT_AMBULATORY_CARE_PROVIDER_SITE_OTHER): Payer: Medicare Other | Admitting: Podiatry

## 2014-08-11 VITALS — BP 160/75 | HR 63 | Resp 14

## 2014-08-11 DIAGNOSIS — B351 Tinea unguium: Secondary | ICD-10-CM

## 2014-08-11 DIAGNOSIS — L84 Corns and callosities: Secondary | ICD-10-CM

## 2014-08-11 DIAGNOSIS — M79672 Pain in left foot: Secondary | ICD-10-CM

## 2014-08-11 DIAGNOSIS — E1149 Type 2 diabetes mellitus with other diabetic neurological complication: Secondary | ICD-10-CM

## 2014-08-11 NOTE — Progress Notes (Signed)
[name], female   DOB: [birth year], 64 y.o.   MRN: [id number] I need my toe nails trimmed

## 2014-08-11 NOTE — Progress Notes (Signed)
[name], female   DOB: 22-Nov-1950, 64 y.o.   MRN: [id number] Complaint:  Visit Type: Patient returns to my office for continued preventative foot care services. Complaint: Patient states" my nails have grown long and thick and become painful to walk and wear shoes" Patient has been diagnosed with DM with neuropathy He presents for preventative foot care services. No changes to ROS.  Painful callus under the balls of both feet.  Podiatric Exam: Vascular: dorsalis pedis and posterior tibial pulses are palpable bilateral. Capillary return is immediate. Temperature gradient is WNL. Skin turgor WNL  Sensorium: Diminished Semmes Weinstein monofilament test. Normal tactile sensation bilaterally. Nail Exam: Pt has thick disfigured discolored nails with subungual debris noted bilateral entire nail hallux through fifth toenails Ulcer Exam: There is no evidence of ulcer or pre-ulcerative changes or infection. Orthopedic Exam: Muscle tone and strength are WNL. No limitations in general ROM. No crepitus or effusions noted. Foot type and digits show no abnormalities. Bony prominences are unremarkable. Skin: No Porokeratosis. No infection or ulcers.  Callus under forefoot B/L  Diagnosis:  Tinea unguium, Pain in right toe, pain in left toes  Treatment & Plan Procedures and Treatment: Consent by patient was obtained for treatment procedures. The patient understood the discussion of treatment and procedures well. All questions were answered thoroughly reviewed. Debridement of mycotic and hypertrophic toenails, 1 through 5 bilateral and clearing of subungual debris. No ulceration, no infection noted. Debride callus Return Visit-Office Procedure: Patient instructed to return to the office for a follow up visit 3 months for continued evaluation and treatment.

## 2014-08-12 DIAGNOSIS — F332 Major depressive disorder, recurrent severe without psychotic features: Secondary | ICD-10-CM | POA: Diagnosis not present

## 2014-08-18 ENCOUNTER — Ambulatory Visit (INDEPENDENT_AMBULATORY_CARE_PROVIDER_SITE_OTHER): Payer: Medicare Other | Admitting: Dietician

## 2014-08-18 VITALS — Wt 233.0 lb

## 2014-08-18 DIAGNOSIS — Z713 Dietary counseling and surveillance: Secondary | ICD-10-CM

## 2014-08-18 DIAGNOSIS — E1165 Type 2 diabetes mellitus with hyperglycemia: Secondary | ICD-10-CM

## 2014-08-18 DIAGNOSIS — E11319 Type 2 diabetes mellitus with unspecified diabetic retinopathy without macular edema: Secondary | ICD-10-CM

## 2014-08-18 NOTE — Patient Instructions (Signed)
Your a1C is due 10/13/14.  Inject 27 units in the morning  Inject 22 units in the evening.  Please check a bedtime blood sugar two times in the next few weeks.  Hope you feel better!  :)

## 2014-08-18 NOTE — Progress Notes (Signed)
Medical Nutrition Therapy:  Appt start time: 0930 end time:  1000.  Assessment:  Primary concerns today: blood sugar control Chelsea [name] is here for her 2nd follow up visit. She is stressed about family issues. She plans to attend her support group this coming Monday. Her blood sugars are still better than prior to MNT, but increased a bit. She stated carb food groups today.   Her stated blood sugar goal is 130-150 before meals.   Preferred Learning Style: Auditory, Visual,Hands on Learning Readiness: Ready and Change in progress  MEDICATIONS: She has been taking  Novolog Mix 70/30 25 units before breakfast and 20 units before supper WEIGHT: 233# BLOOD SUGARS: meter downloaded: Average is 200 for past 4 weeks.[id number]. Checks 1.5 times a day- more fasting. Her fastings are still mostly above target, but need to know bedtime reading before considering an increase in PM insulin. Her Pm readings are highest 112-290 with averages 206/215/242 compared to am averages of 1290-192 DIETARY INTAKE: Usual eating pattern includes 3 meals and 3 snacks per day. Says she continues to try to eat 3 carb servings per meal and no more than 1 for snacks  24-hr recall:  B ( AM): egg, cereal with 1% milk, 1/2 cup blueberries  She did not want to finish the recall Beverages: unsweetened tea, diet soda, water, coffee  PHYSICAL ACTIVITY: walks at [employer] and ADLs  Estimated energy needs: 1700-1800  calories 200 g carbohydrates   Progress Towards Goal(s):  In progress.   Nutritional Diagnosis:  NB-1.1 Food and nutrition-related knowledge deficit As related to lack of sufficient prior diabetes training improving  As evidenced by her report of wanting to learn now and  her feeling more confident. .    Intervention:  Reinforcemet of basic carb counting education. Taught adjustments for activity to prevent additional weight gain and hypoglycemia.  Coordinaion of care- options for improved control are VGo,  janumet. continued diet and exercise therapy.   Teaching Method Utilized: Visual, Auditory, Hands on Handouts given during visit include: carb adjustment for exercise Barriers to learning/adherence to lifestyle change: financial  Demonstrated degree of understanding via:  Teach Back, used food models to demo her meal plan.   Monitoring/Evaluation:  Dietary intake, exercise, meter, and body weight in 3 week(s).

## 2014-08-19 ENCOUNTER — Other Ambulatory Visit: Payer: Self-pay | Admitting: Internal Medicine

## 2014-08-19 DIAGNOSIS — E11319 Type 2 diabetes mellitus with unspecified diabetic retinopathy without macular edema: Secondary | ICD-10-CM

## 2014-08-23 DIAGNOSIS — F332 Major depressive disorder, recurrent severe without psychotic features: Secondary | ICD-10-CM | POA: Diagnosis not present

## 2014-08-27 ENCOUNTER — Other Ambulatory Visit: Payer: Self-pay | Admitting: Internal Medicine

## 2014-08-27 DIAGNOSIS — F332 Major depressive disorder, recurrent severe without psychotic features: Secondary | ICD-10-CM | POA: Diagnosis not present

## 2014-08-27 DIAGNOSIS — Z1231 Encounter for screening mammogram for malignant neoplasm of breast: Secondary | ICD-10-CM

## 2014-08-30 DIAGNOSIS — F332 Major depressive disorder, recurrent severe without psychotic features: Secondary | ICD-10-CM | POA: Diagnosis not present

## 2014-09-03 DIAGNOSIS — F332 Major depressive disorder, recurrent severe without psychotic features: Secondary | ICD-10-CM | POA: Diagnosis not present

## 2014-09-06 DIAGNOSIS — F332 Major depressive disorder, recurrent severe without psychotic features: Secondary | ICD-10-CM | POA: Diagnosis not present

## 2014-09-07 ENCOUNTER — Telehealth: Payer: Self-pay | Admitting: Dietician

## 2014-09-07 NOTE — Telephone Encounter (Signed)
Patient chart showed eye exam as done, but results were not in her chart. Called [place] [facility] to confirm eye exam done and request results. They are faxing results from 09/2013.

## 2014-09-08 ENCOUNTER — Encounter: Payer: Self-pay | Admitting: *Deleted

## 2014-09-09 ENCOUNTER — Ambulatory Visit (INDEPENDENT_AMBULATORY_CARE_PROVIDER_SITE_OTHER): Payer: Medicare Other | Admitting: Internal Medicine

## 2014-09-09 ENCOUNTER — Encounter: Payer: Self-pay | Admitting: Internal Medicine

## 2014-09-09 VITALS — BP 158/52 | HR 63 | Temp 98.3°F | Wt 233.1 lb

## 2014-09-09 DIAGNOSIS — Z13820 Encounter for screening for osteoporosis: Secondary | ICD-10-CM

## 2014-09-09 DIAGNOSIS — I10 Essential (primary) hypertension: Secondary | ICD-10-CM

## 2014-09-09 DIAGNOSIS — E11319 Type 2 diabetes mellitus with unspecified diabetic retinopathy without macular edema: Secondary | ICD-10-CM

## 2014-09-09 DIAGNOSIS — E1165 Type 2 diabetes mellitus with hyperglycemia: Secondary | ICD-10-CM

## 2014-09-09 DIAGNOSIS — Z794 Long term (current) use of insulin: Secondary | ICD-10-CM | POA: Diagnosis not present

## 2014-09-09 DIAGNOSIS — Z0000 Encounter for general adult medical examination without abnormal findings: Secondary | ICD-10-CM

## 2014-09-09 LAB — POCT GLYCOSYLATED HEMOGLOBIN (HGB A1C): Hemoglobin A1C: 7.5

## 2014-09-09 LAB — GLUCOSE, CAPILLARY: Glucose-Capillary: 226 mg/dL — ABNORMAL HIGH (ref 65–99)
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
## 2014-09-09 NOTE — Patient Instructions (Signed)
Thank you for your visit.  Continue your current medications as you are.  Your Hgb A1C is 7.5 now. Good job!  I will refer you for a bone density scan (DEXA).  Keep up the good work with your diet and exercise!  We will follow up with you in 3 months.

## 2014-09-09 NOTE — Assessment & Plan Note (Signed)
BP Readings from Last 3 Encounters:  09/09/14 158/52  08/11/14 160/75  06/25/14 148/55   Patient's blood pressure is 158/52 today with goal less than 150/90.  After meeting with Debera Lat, RD she states that she has been working on her diet and exercise considerably.  -Continue current medications Amlodipine 10 mg daily, Atenolol 100 mg daily, Losartan-HCTZ 100-25 mg daily. -Continue diet and exercise regimen.

## 2014-09-09 NOTE — Assessment & Plan Note (Signed)
Osteoporosis screening by DEXA scan was ordered on last visit to the clinic, but this was not completed for some reason.  -Referral is still standing and we will try to have patient scheduled for DEXA.  -Patient refused pneumonia vaccine today and may consider at next visit.

## 2014-09-09 NOTE — Assessment & Plan Note (Signed)
Patient has been meeting with Debera Lat, RD for her diabetic and nutrition management. Patient states she is now taking her Novolog (70/30) 27 Units at 8 am and 22 Units at 6 pm before meals. She was on Metformin 2000 mg total daily but was reduced to 500 mg BID after having side effects of diarrhea and stomach pains. She denies anymore diarrhea or stomach pains. Patient states that she checks her blood glucose twice a day before meals. She has not experienced any recent highs or low. She states that she is working on her diet, limiting sugary foods and drinks, eating more vegetables, counting calories, and measuring food portions. She also states that she is walking every morning and is taking line dancing classes as well as water aerobics. Overall, she feels that her health is improved.  Patient's HgbA1C on 06/25/2014 was 9.4. Today it is 7.5. CBG today is 226 (patient reports eating a bologna sandwich and drinking a grape soda prior to visit) Glucometer readings for the last month: Avg 204, Range 122-474. Most numbers for the last two weeks range from 160s - 180s.  -Continue Novolog 70/30 with 27 Units in the am and 22 Units pm -Continue Metformin 500 mg twice a day -Continue with lifestyle modifications

## 2014-09-10 ENCOUNTER — Other Ambulatory Visit: Payer: Self-pay | Admitting: Internal Medicine

## 2014-09-10 DIAGNOSIS — E11319 Type 2 diabetes mellitus with unspecified diabetic retinopathy without macular edema: Secondary | ICD-10-CM

## 2014-09-10 DIAGNOSIS — F332 Major depressive disorder, recurrent severe without psychotic features: Secondary | ICD-10-CM | POA: Diagnosis not present

## 2014-09-10 NOTE — Progress Notes (Signed)
Medicine attending: I personally interviewed and briefly examined this patient, and reviewed pertinent clinical laboratory and radiographic data  with resident physician Dr.Vishal Posey Pronto on the day of the patient visit and we discussed a   management plan.

## 2014-09-10 NOTE — Telephone Encounter (Signed)
Asks for refill on strips before the weekend

## 2014-09-13 ENCOUNTER — Telehealth: Payer: Self-pay | Admitting: Dietician

## 2014-09-13 DIAGNOSIS — F332 Major depressive disorder, recurrent severe without psychotic features: Secondary | ICD-10-CM | POA: Diagnosis not present

## 2014-09-13 NOTE — Telephone Encounter (Signed)
Had an issue with the drug store, but she got it taken care of. Also has to reschedule her appointment for this wednesday with CDE so she can attend her dance class.  She says she';; call back to reschedule because she is in her dance class now

## 2014-09-15 ENCOUNTER — Ambulatory Visit: Payer: Medicare Other | Admitting: Dietician

## 2014-09-16 ENCOUNTER — Encounter: Payer: Self-pay | Admitting: Gastroenterology

## 2014-09-17 DIAGNOSIS — F332 Major depressive disorder, recurrent severe without psychotic features: Secondary | ICD-10-CM | POA: Diagnosis not present

## 2014-09-23 ENCOUNTER — Ambulatory Visit (HOSPITAL_COMMUNITY)
Admission: RE | Admit: 2014-09-23 | Discharge: 2014-09-23 | Disposition: A | Discharge status: Home / Self Care | Payer: Medicare Other | Source: Ambulatory Visit | Attending: Internal Medicine | Admitting: Internal Medicine

## 2014-09-23 DIAGNOSIS — Z1231 Encounter for screening mammogram for malignant neoplasm of breast: Secondary | ICD-10-CM | POA: Diagnosis not present

## 2014-09-24 ENCOUNTER — Other Ambulatory Visit: Payer: Self-pay | Admitting: Internal Medicine

## 2014-09-24 DIAGNOSIS — R928 Other abnormal and inconclusive findings on diagnostic imaging of breast: Secondary | ICD-10-CM

## 2014-10-01 ENCOUNTER — Ambulatory Visit
Admission: RE | Admit: 2014-10-01 | Discharge: 2014-10-01 | Disposition: A | Discharge status: Home / Self Care | Payer: Medicare Other | Source: Ambulatory Visit | Attending: Internal Medicine | Admitting: Internal Medicine

## 2014-10-01 DIAGNOSIS — R928 Other abnormal and inconclusive findings on diagnostic imaging of breast: Secondary | ICD-10-CM | POA: Diagnosis not present

## 2014-10-04 DIAGNOSIS — F332 Major depressive disorder, recurrent severe without psychotic features: Secondary | ICD-10-CM | POA: Diagnosis not present

## 2014-10-06 ENCOUNTER — Ambulatory Visit (INDEPENDENT_AMBULATORY_CARE_PROVIDER_SITE_OTHER): Payer: Medicare Other | Admitting: Internal Medicine

## 2014-10-06 ENCOUNTER — Encounter: Payer: Self-pay | Admitting: Internal Medicine

## 2014-10-06 VITALS — BP 143/56 | HR 62 | Temp 98.2°F | Ht 63.0 in | Wt 232.1 lb

## 2014-10-06 DIAGNOSIS — F4321 Adjustment disorder with depressed mood: Secondary | ICD-10-CM

## 2014-10-06 DIAGNOSIS — Z0000 Encounter for general adult medical examination without abnormal findings: Secondary | ICD-10-CM

## 2014-10-06 DIAGNOSIS — E11319 Type 2 diabetes mellitus with unspecified diabetic retinopathy without macular edema: Secondary | ICD-10-CM | POA: Diagnosis not present

## 2014-10-06 LAB — GLUCOSE, CAPILLARY: Glucose-Capillary: 131 mg/dL — ABNORMAL HIGH (ref 65–99)
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
## 2014-10-06 NOTE — Assessment & Plan Note (Signed)
Patient has multiple close family and friend deaths over the last year with the most recent being the loss of her cousin 2 weeks ago. Her symptoms of insomnia, stress, and occasional palpitations/shakes are likely due to grief from her many losses. She also states that having her young family members at her house are inciting factors for her symptoms due to their excitable nature. Her palpitations are worrisome for possible arrhythmia, though she denies any lightheadedness/dizziness or feelings of fainting/syncope or loss of consciousness. I have also considered a thyroid issue as a possible cause for her symptoms. She does not have any history of hyper or hypothyroidism and her last TSH in January 2015 was WNL. Patient states that she has attended group counseling in the past which has been helpful. She has tried Prozac before for depression and melatonin for sleep, both of which she says were not helpful. -At this time we believe her best treatment would be from counseling for grief. She has agreed to contact her counseling group to begin sessions again. She is also advised that we can refer her to grief counseling should she prefer that. -We do not believe adding any medication at this time would be beneficial -Will need to consider TSH at follow up if symptoms persist

## 2014-10-06 NOTE — Patient Instructions (Addendum)
Thank you for your visit.  I am sorry for your loss Chelsea [name].    We do not think that medications will be helpful at this time.   Please try to get back in touch with your group counseling which will be helpful for your stress and grief. If you like, we can try to arrange for grief counseling as well.  Before you go to sleep, try not to watch TV or other bright screens on phone or computer and avoid exercise right before bedtime. Also try to avoid afternoon naps. Doing this should help you sleep better at night.

## 2014-10-06 NOTE — Assessment & Plan Note (Signed)
Patient had annual screening mammography on 09/23/2014 which showed possible distorsion in the right breast. Follow up diagnostic mammogram of right breast showed: No mammographic evidence of malignancy, right breast. The area of concern on the screening mammogram is consistent with a summation of overlapping fibroglandular tissue.  She is recommended to continue with yearly mammograms.

## 2014-10-08 DIAGNOSIS — F332 Major depressive disorder, recurrent severe without psychotic features: Secondary | ICD-10-CM | POA: Diagnosis not present

## 2014-10-11 NOTE — Progress Notes (Signed)
Internal Medicine Clinic Attending  I saw and evaluated the patient.  I personally confirmed the key portions of the history and exam documented by Dr. Patel,Vishal and I reviewed pertinent patient test results.  The assessment, diagnosis, and plan were formulated together and I agree with the documentation in the resident's note.  

## 2014-10-11 NOTE — Assessment & Plan Note (Signed)
CBG 131. Patient states blood sugars have been doing well at home, but would not like to discuss at this time as she has time constraints.  -Follow up in 2 months and recheck Hgb A1C

## 2014-10-12 DIAGNOSIS — F332 Major depressive disorder, recurrent severe without psychotic features: Secondary | ICD-10-CM | POA: Diagnosis not present

## 2014-10-15 DIAGNOSIS — F332 Major depressive disorder, recurrent severe without psychotic features: Secondary | ICD-10-CM | POA: Diagnosis not present

## 2014-10-19 ENCOUNTER — Other Ambulatory Visit: Payer: Self-pay | Admitting: Internal Medicine

## 2014-10-19 NOTE — Addendum Note (Signed)
Addended by: Hulan Fray on: 10/19/2014 08:42 PM   Modules accepted: Orders

## 2014-10-22 ENCOUNTER — Other Ambulatory Visit: Payer: Self-pay | Admitting: Internal Medicine

## 2014-10-25 DIAGNOSIS — F332 Major depressive disorder, recurrent severe without psychotic features: Secondary | ICD-10-CM | POA: Diagnosis not present

## 2014-10-26 ENCOUNTER — Other Ambulatory Visit: Payer: Self-pay | Admitting: *Deleted

## 2014-10-26 DIAGNOSIS — E11319 Type 2 diabetes mellitus with unspecified diabetic retinopathy without macular edema: Secondary | ICD-10-CM

## 2014-10-26 NOTE — Telephone Encounter (Signed)
New pharmacy

## 2014-10-27 MED ORDER — ACCU-CHEK SOFTCLIX LANCETS MISC: Status: DC

## 2014-10-28 ENCOUNTER — Telehealth: Payer: Self-pay | Admitting: Internal Medicine

## 2014-10-28 NOTE — Telephone Encounter (Signed)
Patient requesting a Lancet refill from [facility] on [address]

## 2014-10-29 DIAGNOSIS — F332 Major depressive disorder, recurrent severe without psychotic features: Secondary | ICD-10-CM | POA: Diagnosis not present

## 2014-10-29 NOTE — Telephone Encounter (Signed)
filled

## 2014-11-01 DIAGNOSIS — F332 Major depressive disorder, recurrent severe without psychotic features: Secondary | ICD-10-CM | POA: Diagnosis not present

## 2014-11-05 DIAGNOSIS — F332 Major depressive disorder, recurrent severe without psychotic features: Secondary | ICD-10-CM | POA: Diagnosis not present

## 2014-11-08 DIAGNOSIS — F332 Major depressive disorder, recurrent severe without psychotic features: Secondary | ICD-10-CM | POA: Diagnosis not present

## 2014-11-10 ENCOUNTER — Ambulatory Visit: Payer: Medicare Other | Admitting: Podiatry

## 2014-11-10 DIAGNOSIS — H25012 Cortical age-related cataract, left eye: Secondary | ICD-10-CM | POA: Diagnosis not present

## 2014-11-10 DIAGNOSIS — H2512 Age-related nuclear cataract, left eye: Secondary | ICD-10-CM | POA: Diagnosis not present

## 2014-11-10 DIAGNOSIS — Z961 Presence of intraocular lens: Secondary | ICD-10-CM | POA: Diagnosis not present

## 2014-11-10 DIAGNOSIS — E10359 Type 1 diabetes mellitus with proliferative diabetic retinopathy without macular edema: Secondary | ICD-10-CM | POA: Diagnosis not present

## 2014-11-10 DIAGNOSIS — Z794 Long term (current) use of insulin: Secondary | ICD-10-CM | POA: Diagnosis not present

## 2014-11-10 LAB — HM DIABETES EYE EXAM

## 2014-11-12 ENCOUNTER — Encounter: Payer: Self-pay | Admitting: Podiatry

## 2014-11-12 ENCOUNTER — Ambulatory Visit (INDEPENDENT_AMBULATORY_CARE_PROVIDER_SITE_OTHER): Payer: Medicare Other | Admitting: Podiatry

## 2014-11-12 DIAGNOSIS — B351 Tinea unguium: Secondary | ICD-10-CM

## 2014-11-12 DIAGNOSIS — M79676 Pain in unspecified toe(s): Secondary | ICD-10-CM

## 2014-11-12 DIAGNOSIS — F332 Major depressive disorder, recurrent severe without psychotic features: Secondary | ICD-10-CM | POA: Diagnosis not present

## 2014-11-12 NOTE — Progress Notes (Signed)
[name], female   DOB: [birth year], 64 y.o.   MRN: [id number] Complaint:  Visit Type: Patient returns to my office for continued preventative foot care services. Complaint: Patient states" my nails have grown long and thick and become painful to walk and wear shoes" Patient has been diagnosed with DM with neuropathy He presents for preventative foot care services. No changes to ROS.  Painful callus under the balls of both feet.  Podiatric Exam: Vascular: dorsalis pedis and posterior tibial pulses are palpable bilateral. Capillary return is immediate. Temperature gradient is WNL. Skin turgor WNL  Sensorium: Diminished Semmes Weinstein monofilament test. Normal tactile sensation bilaterally. Nail Exam: Pt has thick disfigured discolored nails with subungual debris noted bilateral entire nail hallux through fifth toenails Ulcer Exam: There is no evidence of ulcer or pre-ulcerative changes or infection. Orthopedic Exam: Muscle tone and strength are WNL. No limitations in general ROM. No crepitus or effusions noted. Foot type and digits show no abnormalities. Bony prominences are unremarkable. Skin: No Porokeratosis. No infection or ulcers.  Callus under forefoot B/L  Diagnosis:  Tinea unguium, Pain in right toe, pain in left toes  Treatment & Plan Procedures and Treatment: Consent by patient was obtained for treatment procedures. The patient understood the discussion of treatment and procedures well. All questions were answered thoroughly reviewed. Debridement of mycotic and hypertrophic toenails, 1 through 5 bilateral and clearing of subungual debris. No ulceration, no infection noted. Debride callus Return Visit-Office Procedure: Patient instructed to return to the office for a follow up visit 3 months for continued evaluation and treatment.

## 2014-11-15 DIAGNOSIS — F332 Major depressive disorder, recurrent severe without psychotic features: Secondary | ICD-10-CM | POA: Diagnosis not present

## 2014-11-19 ENCOUNTER — Encounter: Payer: Self-pay | Admitting: *Deleted

## 2014-11-19 DIAGNOSIS — F332 Major depressive disorder, recurrent severe without psychotic features: Secondary | ICD-10-CM | POA: Diagnosis not present

## 2014-11-22 ENCOUNTER — Telehealth: Payer: Self-pay | Admitting: Dietician

## 2014-11-22 DIAGNOSIS — E113593 Type 2 diabetes mellitus with proliferative diabetic retinopathy without macular edema, bilateral: Secondary | ICD-10-CM

## 2014-11-22 DIAGNOSIS — Z794 Long term (current) use of insulin: Principal | ICD-10-CM

## 2014-11-22 DIAGNOSIS — F332 Major depressive disorder, recurrent severe without psychotic features: Secondary | ICD-10-CM | POA: Diagnosis not present

## 2014-11-22 MED ORDER — ACCU-CHEK AVIVA PLUS W/DEVICE KIT: PACK | Status: DC

## 2014-11-22 NOTE — Telephone Encounter (Signed)
Patient calls back to ask for help again with her meter because [facility] told her that her insurance will not pay for a meter because it has not been 5 years since her last one. Suggested she call the toll free number on the back of her meter and tell them she needs a working meter because she is on insulin and her insurance will not pay for one. She verbalized understanding say ig she would call us back if needed.

## 2014-11-22 NOTE — Telephone Encounter (Signed)
Needs meter, tried to help her reset it on the phone and this did not work. She has taken her insulin today but cannot check. [facility] says they can give her a new one with a prescription.

## 2014-11-26 DIAGNOSIS — F332 Major depressive disorder, recurrent severe without psychotic features: Secondary | ICD-10-CM | POA: Diagnosis not present

## 2014-11-29 DIAGNOSIS — F332 Major depressive disorder, recurrent severe without psychotic features: Secondary | ICD-10-CM | POA: Diagnosis not present

## 2014-12-01 ENCOUNTER — Other Ambulatory Visit: Payer: Self-pay | Admitting: Internal Medicine

## 2014-12-01 DIAGNOSIS — I10 Essential (primary) hypertension: Secondary | ICD-10-CM

## 2014-12-01 MED ORDER — AMLODIPINE BESYLATE 10 MG PO TABS: 10.0000 mg | ORAL_TABLET | Freq: Every day | ORAL | Status: DC

## 2014-12-01 NOTE — Telephone Encounter (Signed)
Pt requesting amlodipine to be filled @ [facility].

## 2014-12-03 ENCOUNTER — Other Ambulatory Visit: Payer: Self-pay | Admitting: Internal Medicine

## 2014-12-03 NOTE — Telephone Encounter (Signed)
Per July note, reduced to 500 BID 2/2 GI side effects

## 2014-12-09 ENCOUNTER — Other Ambulatory Visit: Payer: Self-pay | Admitting: Internal Medicine

## 2014-12-13 ENCOUNTER — Other Ambulatory Visit: Payer: Self-pay | Admitting: Internal Medicine

## 2014-12-20 DIAGNOSIS — F332 Major depressive disorder, recurrent severe without psychotic features: Secondary | ICD-10-CM | POA: Diagnosis not present

## 2014-12-27 DIAGNOSIS — F332 Major depressive disorder, recurrent severe without psychotic features: Secondary | ICD-10-CM | POA: Diagnosis not present

## 2015-01-10 DIAGNOSIS — F332 Major depressive disorder, recurrent severe without psychotic features: Secondary | ICD-10-CM | POA: Diagnosis not present

## 2015-01-19 ENCOUNTER — Encounter: Payer: Medicare Other | Admitting: Internal Medicine

## 2015-01-21 DIAGNOSIS — F332 Major depressive disorder, recurrent severe without psychotic features: Secondary | ICD-10-CM | POA: Diagnosis not present

## 2015-01-31 ENCOUNTER — Other Ambulatory Visit: Payer: Self-pay | Admitting: Internal Medicine

## 2015-02-02 ENCOUNTER — Ambulatory Visit: Payer: Medicare Other | Admitting: Podiatry

## 2015-02-02 DIAGNOSIS — F332 Major depressive disorder, recurrent severe without psychotic features: Secondary | ICD-10-CM | POA: Diagnosis not present

## 2015-02-18 DIAGNOSIS — F332 Major depressive disorder, recurrent severe without psychotic features: Secondary | ICD-10-CM | POA: Diagnosis not present

## 2015-02-25 DIAGNOSIS — F332 Major depressive disorder, recurrent severe without psychotic features: Secondary | ICD-10-CM | POA: Diagnosis not present

## 2015-02-28 ENCOUNTER — Other Ambulatory Visit: Payer: Self-pay | Admitting: Internal Medicine

## 2015-03-14 DIAGNOSIS — F332 Major depressive disorder, recurrent severe without psychotic features: Secondary | ICD-10-CM | POA: Diagnosis not present

## 2015-03-17 DIAGNOSIS — F332 Major depressive disorder, recurrent severe without psychotic features: Secondary | ICD-10-CM | POA: Diagnosis not present

## 2015-04-04 ENCOUNTER — Ambulatory Visit (INDEPENDENT_AMBULATORY_CARE_PROVIDER_SITE_OTHER): Payer: Medicare Other | Admitting: Internal Medicine

## 2015-04-04 ENCOUNTER — Encounter: Payer: Self-pay | Admitting: Internal Medicine

## 2015-04-04 VITALS — BP 161/53 | HR 65 | Temp 97.4°F | Wt 246.4 lb

## 2015-04-04 DIAGNOSIS — I10 Essential (primary) hypertension: Secondary | ICD-10-CM

## 2015-04-04 DIAGNOSIS — E113593 Type 2 diabetes mellitus with proliferative diabetic retinopathy without macular edema, bilateral: Secondary | ICD-10-CM

## 2015-04-04 DIAGNOSIS — Z794 Long term (current) use of insulin: Secondary | ICD-10-CM

## 2015-04-04 DIAGNOSIS — E1165 Type 2 diabetes mellitus with hyperglycemia: Secondary | ICD-10-CM

## 2015-04-04 DIAGNOSIS — I8310 Varicose veins of unspecified lower extremity with inflammation: Secondary | ICD-10-CM | POA: Insufficient documentation

## 2015-04-04 DIAGNOSIS — Z7984 Long term (current) use of oral hypoglycemic drugs: Secondary | ICD-10-CM | POA: Diagnosis not present

## 2015-04-04 DIAGNOSIS — Z0000 Encounter for general adult medical examination without abnormal findings: Secondary | ICD-10-CM

## 2015-04-04 DIAGNOSIS — Z79899 Other long term (current) drug therapy: Secondary | ICD-10-CM | POA: Diagnosis not present

## 2015-04-04 DIAGNOSIS — E11319 Type 2 diabetes mellitus with unspecified diabetic retinopathy without macular edema: Secondary | ICD-10-CM

## 2015-04-04 DIAGNOSIS — R234 Changes in skin texture: Secondary | ICD-10-CM | POA: Diagnosis not present

## 2015-04-04 LAB — GLUCOSE, CAPILLARY: GLUCOSE-CAPILLARY: 136 mg/dL — AB (ref 65–99)

## 2015-04-04 LAB — POCT GLYCOSYLATED HEMOGLOBIN (HGB A1C): Hemoglobin A1C: 9

## 2015-04-04 MED ORDER — INSULIN ASPART PROT & ASPART (70-30 MIX) 100 UNIT/ML PEN: 25.0000 [IU] | PEN_INJECTOR | Freq: Two times a day (BID) | SUBCUTANEOUS | Status: DC

## 2015-04-04 MED ORDER — BETAMETHASONE DIPROPIONATE 0.05 % EX CREA
TOPICAL_CREAM | Freq: Two times a day (BID) | CUTANEOUS | Status: DC
Start: 2015-04-04 — End: 2015-05-18

## 2015-04-04 NOTE — Assessment & Plan Note (Signed)
Patient reports about 4 weeks of dry, thickened skin around her right ankle with dark discoloration and pruritis. She has tried OTC Cortisone and lotion with only slight improvement. She reports constant itching and scratches the area using her nails. She denies any change in soaps or detergents that may be associated with her symptoms. She denies any cuts or injury to the area and there is no associated pain. She does state that her shoes do not fit well and has a pair of diabetic shoes at home. She also follows with Podiatry and plans to schedule a follow up visit with them.  A/P: Right ankle with dry thickened skin and dark discoloration. No open sores, cuts, or evidence of trauma. No significant swelling, fluctuance, erythema, or pain on palpation. ROM is normal. This may be lichenification from constant scratching or atopic dermatitis. Unclear why she began to have pruritis in the area as she denies any change in soaps, detergents, etc. -Start Betamethasone 0.05% BID for up to 4 weeks -Advised patient to use Vasoline over the area for moisturizer -Continue with plans to follow up with Podiatry

## 2015-04-04 NOTE — Assessment & Plan Note (Signed)
Patient states that she has had a setback regarding her diet and exercise due to stress at home for the last few months. She says this is resolving, but has noticed that it has effected her blood sugars which are consistently high when she checks. She tries to check her CBG twice a day before breakfast and dinner, but sometimes only checks once. She brought her meter today and readings show AM average of 245 and PM average of 230-240. She reports compliance to Novolog 70/30 and takes 27 Units in the AM and 22 Units PM. She also takes Metformin 500 mg BID, which is not increased due to GI side effects at higher doses.  Hgb A1c in July 2016 was 7.5. Today it is 9.0. CBG today is 136.  A/P: Patient's diabetic control has worsened in the setting of stress at home disrupting diet and exercise. She says her inciting stress factors are dealt with and she feels motivated to return to lifestyle modifications to get her diabetes under control. She says she will try to exercise with friends who also have diabetes, which helps her stay motivated. -Increase Novolog 70/30 to 30 Units AM and 25 Units PM -Continue Metformin 500 mg BID -Continue diet and exercise -Repeat Hgb A1C in 3 months

## 2015-04-04 NOTE — Patient Instructions (Signed)
Please check your blood pressure twice a week at your local pharmacy and keep a log of the results to bring on next visit.  I will send a prescription of a cream for your ankle called Beta-methasone 0.05% to use twice a day for the next 4 weeks. You can also use Vaseline to keep the area moisturized.  Your A1C today is 9.0. Please increase your insulin to 30 Units in the morning and 25 Units at night. Continue to check your blood sugars twice a day and work on diet and exercise.  Please follow up with [place] in 2-4 weeks.

## 2015-04-04 NOTE — Assessment & Plan Note (Signed)
Patient did not receive flu shot this year and is eligible for PCV 13 and Hep C screening. She also has not had a Bone Density scan.   A/P: -Patient declines PCV 13, Influenza, Hep C screening.  -Calculated FRAX score gives 10 year probability of major osteopathic fracture of 5.3% and hip fracture of 0.6%. Will hold off on DEXA for now and discuss on follow up if she will want to have this done and would be agreeable to bisphosphonate therapy if indicated.
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
## 2015-04-04 NOTE — Assessment & Plan Note (Signed)
BP Readings from Last 3 Encounters:  04/04/15 161/53  10/06/14 143/56  09/09/14 158/52  Patient's BP elevated at 161/53 on arrival to clinic with goal less than 150/90. Patient reports compliance to current medications of Amlodipine 10 mg daily, Atenolol 100 mg daily, and Losartan-HCTZ 100-25 mg daily.  A/P: On repeat, patient's BP is 144/60. Initial elevated BP likely from activity on arrival to clinic. Patient reports that she has had a setback in terms of diet and exercise due to stress at home, but feels like this is no longer an issue and is motivated to work on lifestyle factors. She is currently on maximum doses of her current antihypertensives. -Continue current medications Amlodipine 10 mg daily, Atenolol 100 mg daily, and Losartan-HCTZ 100-25 mg daily -Continue lifestyle modifications -Advised patient to check her blood pressures twice a week at local pharmacy and keep a log, she understands and agrees -If continued elevated blood pressures, will need to consider causes of secondary HTN

## 2015-04-05 LAB — BMP8+ANION GAP
Anion Gap: 18 mmol/L (ref 10.0–18.0)
BUN / CREAT RATIO: 33 — AB (ref 11–26)
BUN: 31 mg/dL — ABNORMAL HIGH (ref 8–27)
CHLORIDE: 103 mmol/L (ref 96–106)
CO2: 21 mmol/L (ref 18–29)
Calcium: 9.4 mg/dL (ref 8.7–10.3)
Creatinine, Ser: 0.93 mg/dL (ref 0.57–1.00)
GFR calc non Af Amer: 64 mL/min/{1.73_m2} (ref >59)
GFR, EST AFRICAN AMERICAN: 74 mL/min/{1.73_m2} (ref >59)
GLUCOSE: 154 mg/dL — AB (ref 65–99)
Potassium: 5.1 mmol/L (ref 3.5–5.2)
Sodium: 142 mmol/L (ref 134–144)

## 2015-04-05 NOTE — Addendum Note (Signed)
Addended by: Gilles Chiquito B on: 04/05/2015 01:43 PM   Modules accepted: Level of Service

## 2015-04-05 NOTE — Progress Notes (Signed)
Internal Medicine Clinic Attending  Case discussed with Dr. Patel,Vishal at the time of the visit.  We reviewed the resident's history and exam and pertinent patient test results.  I agree with the assessment, diagnosis, and plan of care documented in the resident's note.  

## 2015-04-07 DIAGNOSIS — F332 Major depressive disorder, recurrent severe without psychotic features: Secondary | ICD-10-CM | POA: Diagnosis not present

## 2015-04-12 ENCOUNTER — Other Ambulatory Visit: Payer: Self-pay | Admitting: Dietician

## 2015-04-12 DIAGNOSIS — E11319 Type 2 diabetes mellitus with unspecified diabetic retinopathy without macular edema: Secondary | ICD-10-CM

## 2015-04-12 NOTE — Telephone Encounter (Signed)
Needs strips rx sent to [facility] because [facility] does not bill Medicare part B

## 2015-04-13 MED ORDER — GLUCOSE BLOOD VI STRP: ORAL_STRIP | Status: DC

## 2015-04-13 MED ORDER — ACCU-CHEK SOFTCLIX LANCETS MISC: Status: DC

## 2015-04-14 DIAGNOSIS — F332 Major depressive disorder, recurrent severe without psychotic features: Secondary | ICD-10-CM | POA: Diagnosis not present

## 2015-04-25 ENCOUNTER — Other Ambulatory Visit: Payer: Self-pay

## 2015-04-25 ENCOUNTER — Other Ambulatory Visit: Payer: Self-pay | Admitting: Internal Medicine

## 2015-04-25 MED ORDER — ALBUTEROL SULFATE HFA 108 (90 BASE) MCG/ACT IN AERS: 2.0000 | INHALATION_SPRAY | Freq: Four times a day (QID) | RESPIRATORY_TRACT | Status: DC | PRN

## 2015-04-25 NOTE — Telephone Encounter (Signed)
Pt requesting albuterol inhaler to be filled @ [facility].

## 2015-05-02 DIAGNOSIS — F332 Major depressive disorder, recurrent severe without psychotic features: Secondary | ICD-10-CM | POA: Diagnosis not present

## 2015-05-03 ENCOUNTER — Telehealth: Payer: Self-pay | Admitting: Dietician

## 2015-05-03 NOTE — Telephone Encounter (Signed)
Called patient's pharmacy [facility]-  as part of Geriatric Task Force:  1. testing supplies- transferred out to [place], last time she got them there 3x/daily,  2. Pen needles-31 gauge - December 2016, 12/27/2014 and  100 dispensed, 3x/day 3. Lipitor- no refills, escript denied on 01/2015 ,Dr. Trudee Kuster, last refill 2/13 for 90 tablets, 80 1 tab daily,  11/23 for 90 2016 4. metformin- er 500- 2x/day, refills on 3/13, 2/13. 1/16,01/31/2015 5. Novolog Mix 70/30 pen- 30 units in am, 25 units in evening, refills exactly same as metformin  Per [facility] [place]:  1. lipitor- refilled 1x there on 02/25/2015- [facility] 2. novolog Mix 70/30 pen refilled on  03/02/15, transferred 3. Metformin- filled there 02/23/15 then transferred 4. Testing supplies- lancets on hold 5. Test strips- only picked up one time on 04/13/2015, 100 strips,   Per [employer]- got an order for diabetes testing supplies 03/2014, but that was never filled.per her plan she has to get testing supplies locally . Will remove [employer] mail order from her pharmacy profile

## 2015-05-09 DIAGNOSIS — F332 Major depressive disorder, recurrent severe without psychotic features: Secondary | ICD-10-CM | POA: Diagnosis not present

## 2015-05-11 ENCOUNTER — Telehealth: Payer: Self-pay | Admitting: Internal Medicine

## 2015-05-11 NOTE — Telephone Encounter (Signed)
Called, no answer, no vmail

## 2015-05-11 NOTE — Telephone Encounter (Signed)
Would like to speak with nurse

## 2015-05-12 NOTE — Telephone Encounter (Signed)
Pt is calling nurse back-

## 2015-05-12 NOTE — Telephone Encounter (Signed)
Pt calls and ask for an order for a home BP monitor, she does not have transportation to go to a drug store and check

## 2015-05-18 ENCOUNTER — Encounter: Payer: Self-pay | Admitting: Internal Medicine

## 2015-05-18 ENCOUNTER — Ambulatory Visit (INDEPENDENT_AMBULATORY_CARE_PROVIDER_SITE_OTHER): Payer: Medicare Other | Admitting: Internal Medicine

## 2015-05-18 ENCOUNTER — Telehealth: Payer: Self-pay | Admitting: Internal Medicine

## 2015-05-18 VITALS — BP 156/62 | HR 66 | Temp 97.7°F | Wt 248.6 lb

## 2015-05-18 DIAGNOSIS — I10 Essential (primary) hypertension: Secondary | ICD-10-CM | POA: Diagnosis not present

## 2015-05-18 DIAGNOSIS — Z794 Long term (current) use of insulin: Secondary | ICD-10-CM | POA: Diagnosis not present

## 2015-05-18 DIAGNOSIS — E11319 Type 2 diabetes mellitus with unspecified diabetic retinopathy without macular edema: Secondary | ICD-10-CM

## 2015-05-18 DIAGNOSIS — Z0000 Encounter for general adult medical examination without abnormal findings: Secondary | ICD-10-CM

## 2015-05-18 DIAGNOSIS — Z780 Asymptomatic menopausal state: Secondary | ICD-10-CM

## 2015-05-18 DIAGNOSIS — E113593 Type 2 diabetes mellitus with proliferative diabetic retinopathy without macular edema, bilateral: Secondary | ICD-10-CM

## 2015-05-18 DIAGNOSIS — E1165 Type 2 diabetes mellitus with hyperglycemia: Secondary | ICD-10-CM

## 2015-05-18 DIAGNOSIS — Z79899 Other long term (current) drug therapy: Secondary | ICD-10-CM

## 2015-05-18 DIAGNOSIS — E2839 Other primary ovarian failure: Secondary | ICD-10-CM

## 2015-05-18 MED ORDER — BETAMETHASONE DIPROPIONATE 0.05 % EX CREA: TOPICAL_CREAM | Freq: Two times a day (BID) | CUTANEOUS | Status: DC

## 2015-05-18 NOTE — Assessment & Plan Note (Addendum)
Discussed with patient option for bone density scan and possible therapy if found to have osteoporosis. She understands and agrees to referral for DEXA.  A/P: Osteoporosis screening -DEXA -Bisphosphonate therapy if osteoporosis seen

## 2015-05-18 NOTE — Telephone Encounter (Signed)
Discussed this with patient during clinic visit today (05/18/15). She will have family assist her with obtaining a BP monitor.
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
## 2015-05-18 NOTE — Assessment & Plan Note (Signed)
Last A1C was 9.0 on 04/04/15. Her home Novolog 70/30 was increased from 27 units to 30 units am and 22 units to 25 units pm. She says she has adjusted her insulin accordingly. She is also taking Metformin 500 mg BID which is not increased due to GI side effects at higher doses. She brought her meter today and readings show an AM average of 204 and PM average of 220-240. Overall average is 213 with high of 338 and low of 138. She reports one episode of hypoglycemic symptoms of shakes and sweats which resolved with food. She says she is joining a neighborhood walk group.   A/P: Some improvement noted in meter readings compared to last visit. Patient reports adherence with her medications and dietary changes and will try to increase her physical activity as tolerated. -Continue current medications -F/u in 8 weeks for A1C -Consider addition of second oral agent if no improvement in A1C

## 2015-05-18 NOTE — Telephone Encounter (Signed)
APPT. REMINDER CALL, NO ANSWER, NO VOICE MAIL °

## 2015-05-18 NOTE — Patient Instructions (Signed)
It was a pleasure to see you again Chelsea [name]. Please continue your current medications as prescribed.  When you have a blood pressure cuff at home, please check three times a week and write down your blood pressures in a diary to bring next time you come to clinic.  Glad to hear you are working on your diet and joining the neighborhood walking group.  Follow up with [place] in 8 weeks.    DASH Eating Plan DASH stands for "Dietary Approaches to Stop Hypertension." The DASH eating plan is a healthy eating plan that has been shown to reduce high blood pressure (hypertension). Additional health benefits may include reducing the risk of type 2 diabetes mellitus, heart disease, and stroke. The DASH eating plan may also help with weight loss. WHAT DO I NEED TO KNOW ABOUT THE DASH EATING PLAN? For the DASH eating plan, you will follow these general guidelines:  Choose foods with a percent daily value for sodium of less than 5% (as listed on the food label).  Use salt-free seasonings or herbs instead of table salt or sea salt.  Check with your health care provider or pharmacist before using salt substitutes.  Eat lower-sodium products, often labeled as "lower sodium" or "no salt added."  Eat fresh foods.  Eat more vegetables, fruits, and low-fat dairy products.  Choose whole grains. Look for the word "whole" as the first word in the ingredient list.  Choose fish and skinless chicken or [place] more often than red meat. Limit fish, poultry, and meat to 6 oz (170 g) each day.  Limit sweets, desserts, sugars, and sugary drinks.  Choose heart-healthy fats.  Limit cheese to 1 oz (28 g) per day.  Eat more home-cooked food and less restaurant, buffet, and fast food.  Limit fried foods.  Cook foods using methods other than frying.  Limit canned vegetables. If you do use them, rinse them well to decrease the sodium.  When eating at a restaurant, ask that your food be prepared with less salt,  or no salt if possible. WHAT FOODS CAN I EAT? Seek help from a dietitian for individual calorie needs. Grains Whole grain or whole wheat bread. Brown rice. Whole grain or whole wheat pasta. Quinoa, bulgur, and whole grain cereals. Low-sodium cereals. Corn or whole wheat flour tortillas. Whole grain cornbread. Whole grain crackers. Low-sodium crackers. Vegetables Fresh or frozen vegetables (raw, steamed, roasted, or grilled). Low-sodium or reduced-sodium tomato and vegetable juices. Low-sodium or reduced-sodium tomato sauce and paste. Low-sodium or reduced-sodium canned vegetables.  Fruits All fresh, canned (in natural juice), or frozen fruits. Meat and Other Protein Products Ground beef (85% or leaner), grass-fed beef, or beef trimmed of fat. Skinless chicken or [place]. Ground chicken or [place]. Pork trimmed of fat. All fish and seafood. Eggs. Dried beans, peas, or lentils. Unsalted nuts and seeds. Unsalted canned beans. Dairy Low-fat dairy products, such as skim or 1% milk, 2% or reduced-fat cheeses, low-fat ricotta or cottage cheese, or plain low-fat yogurt. Low-sodium or reduced-sodium cheeses. Fats and Oils Tub margarines without trans fats. Light or reduced-fat mayonnaise and salad dressings (reduced sodium). Avocado. Safflower, olive, or canola oils. Natural peanut or almond butter. Other Unsalted popcorn and pretzels. The items listed above may not be a complete list of recommended foods or beverages. Contact your dietitian for more options. WHAT FOODS ARE NOT RECOMMENDED? Grains White bread. White pasta. White rice. Refined cornbread. Bagels and croissants. Crackers that contain trans fat. Vegetables Creamed or fried vegetables. Vegetables in  a cheese sauce. Regular canned vegetables. Regular canned tomato sauce and paste. Regular tomato and vegetable juices. Fruits Dried fruits. Canned fruit in light or heavy syrup. Fruit juice. Meat and Other Protein Products Fatty cuts of meat.  Ribs, chicken wings, bacon, sausage, bologna, salami, chitterlings, fatback, hot dogs, bratwurst, and packaged luncheon meats. Salted nuts and seeds. Canned beans with salt. Dairy Whole or 2% milk, cream, half-and-half, and cream cheese. Whole-fat or sweetened yogurt. Full-fat cheeses or blue cheese. Nondairy creamers and whipped toppings. Processed cheese, cheese spreads, or cheese curds. Condiments Onion and garlic salt, seasoned salt, table salt, and sea salt. Canned and packaged gravies. Worcestershire sauce. Tartar sauce. Barbecue sauce. Teriyaki sauce. Soy sauce, including reduced sodium. Steak sauce. Fish sauce. Oyster sauce. Cocktail sauce. Horseradish. Ketchup and mustard. Meat flavorings and tenderizers. Bouillon cubes. Hot sauce. Tabasco sauce. Marinades. Taco seasonings. Relishes. Fats and Oils Butter, stick margarine, lard, shortening, ghee, and bacon fat. Coconut, palm kernel, or palm oils. Regular salad dressings. Other Pickles and olives. Salted popcorn and pretzels. The items listed above may not be a complete list of foods and beverages to avoid. Contact your dietitian for more information. WHERE CAN I FIND MORE INFORMATION? National Heart, Lung, and Blood Institute: travelstabloid.com   This information is not intended to replace advice given to you by your health care provider. Make sure you discuss any questions you have with your health care provider.   Document Released: 01/18/2011 Document Revised: 02/19/2014 Document Reviewed: 12/03/2012 Elsevier Interactive Patient Education [employer].

## 2015-05-18 NOTE — Assessment & Plan Note (Signed)
BP Readings from Last 3 Encounters:  05/18/15 156/62  04/04/15 161/53  10/06/14 143/56   BP slightly elevated at 156/62 on arrival to clinic. She reports adherence to current medications of Amlodipine 10 mg daily, Atenolol 100 mg daily, and Losartan-HCTZ 100-25 mg daily.  A/P: Repeat BP after patient at rest for several minutes is 126/50, at goal. Patient will try to obtain home BP monitor and check at home 3 times per week and log readings. -Continue current medications -Record home BP readings and bring to clinic on f/u

## 2015-05-20 NOTE — Progress Notes (Signed)
Internal Medicine Clinic Attending  Case discussed with Dr. Patel,Vishal at the time of the visit.  We reviewed the resident's history and exam and pertinent patient test results.  I agree with the assessment, diagnosis, and plan of care documented in the resident's note.  

## 2015-06-01 ENCOUNTER — Ambulatory Visit (INDEPENDENT_AMBULATORY_CARE_PROVIDER_SITE_OTHER): Payer: Medicare Other | Admitting: Podiatry

## 2015-06-01 ENCOUNTER — Encounter: Payer: Self-pay | Admitting: Podiatry

## 2015-06-01 ENCOUNTER — Other Ambulatory Visit: Payer: Self-pay | Admitting: Internal Medicine

## 2015-06-01 DIAGNOSIS — E1149 Type 2 diabetes mellitus with other diabetic neurological complication: Secondary | ICD-10-CM | POA: Diagnosis not present

## 2015-06-01 DIAGNOSIS — M205X9 Other deformities of toe(s) (acquired), unspecified foot: Secondary | ICD-10-CM

## 2015-06-01 DIAGNOSIS — M79676 Pain in unspecified toe(s): Secondary | ICD-10-CM | POA: Diagnosis not present

## 2015-06-01 DIAGNOSIS — B351 Tinea unguium: Secondary | ICD-10-CM

## 2015-06-01 DIAGNOSIS — L84 Corns and callosities: Secondary | ICD-10-CM

## 2015-06-01 DIAGNOSIS — Q828 Other specified congenital malformations of skin: Secondary | ICD-10-CM

## 2015-06-01 NOTE — Telephone Encounter (Signed)
Last appt 05/18/15.

## 2015-06-01 NOTE — Progress Notes (Signed)
[name], female   DOB: 1950/09/27, 65 y.o.   MRN: [id number] Complaint:  Visit Type: Patient returns to my office for continued preventative foot care services. Complaint: Patient states" my nails have grown long and thick and become painful to walk and wear shoes" Patient has been diagnosed with DM with neuropathy He presents for preventative foot care services. No changes to ROS.  Painful callus under the balls of both feet.  Podiatric Exam: Vascular: dorsalis pedis and posterior tibial pulses are palpable bilateral. Capillary return is immediate. Temperature gradient is WNL. Skin turgor WNL  Sensorium: Diminished Semmes Weinstein monofilament test. Normal tactile sensation bilaterally. Nail Exam: Pt has thick disfigured discolored nails with subungual debris noted bilateral entire nail hallux through fifth toenails Ulcer Exam: There is no evidence of ulcer or pre-ulcerative changes or infection. Orthopedic Exam: Muscle tone and strength are WNL. No limitations in general ROM. No crepitus or effusions noted. Foot type and digits show no abnormalities. DJD 1st MPJ B/L. Skin: No Porokeratosis. No infection or ulcers.  Callus under forefoot B/L  Diagnosis:  Tinea unguium, Pain in right toe, pain in left toes  Treatment & Plan Procedures and Treatment: Consent by patient was obtained for treatment procedures. The patient understood the discussion of treatment and procedures well. All questions were answered thoroughly reviewed. Debridement of mycotic and hypertrophic toenails, 1 through 5 bilateral and clearing of subungual debris. No ulceration, no infection noted. Debride callus.  Initiate diabetic shoe paperwork for diabetic neuropathy for her preulcerous lesions and hallux limitus 1st MPJ B/L.Return Visit-Office Procedure: Patient instructed to return to the office for a follow up visit 3 months for continued evaluation and treatment.   Gardiner Barefoot DPM

## 2015-06-16 ENCOUNTER — Other Ambulatory Visit: Payer: Self-pay | Admitting: Internal Medicine

## 2015-07-04 ENCOUNTER — Other Ambulatory Visit: Payer: Self-pay | Admitting: Internal Medicine

## 2015-07-18 ENCOUNTER — Telehealth: Payer: Self-pay | Admitting: Licensed Clinical Social Worker

## 2015-07-18 NOTE — Telephone Encounter (Signed)
CSW placed call to pt's pharmacy to request a 6 month refill history for patient.  Requesting the history to be faxed to [phone].  Contact information provided.

## 2015-07-19 NOTE — Telephone Encounter (Signed)
6 months of records from [place] indicate that pt has been getting Amlodipine, Atenolol, Atorvastatin, Losartin potassium-HCTZ, and Metformin Rx refilled on time for the past 6 months. She has been approximately 1 week "late" in refilling her Novolog Mix 70/30 for 2 of the past 6 months and "on time" for 4 of the past 6 months. While she may have additional pen needles at home, she has not been getting the needles refilled at what would seem to be the needed frequency ( every 50 days.) Yvonna Alanis, RN, 07/19/15, 9:15 AM

## 2015-07-27 ENCOUNTER — Encounter: Payer: Self-pay | Admitting: Internal Medicine

## 2015-07-27 ENCOUNTER — Ambulatory Visit (INDEPENDENT_AMBULATORY_CARE_PROVIDER_SITE_OTHER): Payer: Medicare Other | Admitting: Internal Medicine

## 2015-07-27 VITALS — BP 146/46 | HR 66 | Temp 98.2°F | Wt 246.6 lb

## 2015-07-27 DIAGNOSIS — Z794 Long term (current) use of insulin: Secondary | ICD-10-CM

## 2015-07-27 DIAGNOSIS — Z638 Other specified problems related to primary support group: Secondary | ICD-10-CM | POA: Diagnosis not present

## 2015-07-27 DIAGNOSIS — I10 Essential (primary) hypertension: Secondary | ICD-10-CM

## 2015-07-27 DIAGNOSIS — E11319 Type 2 diabetes mellitus with unspecified diabetic retinopathy without macular edema: Secondary | ICD-10-CM

## 2015-07-27 DIAGNOSIS — E113593 Type 2 diabetes mellitus with proliferative diabetic retinopathy without macular edema, bilateral: Secondary | ICD-10-CM

## 2015-07-27 DIAGNOSIS — Z0000 Encounter for general adult medical examination without abnormal findings: Secondary | ICD-10-CM

## 2015-07-27 DIAGNOSIS — F439 Reaction to severe stress, unspecified: Secondary | ICD-10-CM

## 2015-07-27 LAB — POCT GLYCOSYLATED HEMOGLOBIN (HGB A1C): Hemoglobin A1C: 8.4

## 2015-07-27 LAB — GLUCOSE, CAPILLARY: Glucose-Capillary: 195 mg/dL — ABNORMAL HIGH (ref 65–99)
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
## 2015-07-27 NOTE — Patient Instructions (Signed)
It was good to see you again Chelsea [name].  You are doing a good job with your diet and exercise. Your A1C is improved to 8.4 from 9.0. Keep it up!  I have added information about Liraglutide (Victoza) below for you to read.  Please continue to check your blood pressure at home and continue taking your medications as you are.  Please follow up with [place] in 3 months.       Liraglutide (Victoza) injection What is this medicine? LIRAGLUTIDE (LIR a GLOO tide) is used to improve blood sugar control in adults with type 2 diabetes. This medicine may be used with other oral diabetes medicines. This medicine may be used for other purposes; ask your health care provider or pharmacist if you have questions. What should I tell my health care provider before I take this medicine? They need to know if you have any of these conditions: -endocrine tumors (MEN 2) or if someone in your family had these tumors -gallstones -high cholesterol -history of alcohol abuse problem -history of pancreatitis -kidney disease or if you are on dialysis -liver disease -previous swelling of the tongue, face, or lips with difficulty breathing, difficulty swallowing, hoarseness, or tightening of the throat -stomach problems -suicidal thoughts, plans, or attempt; a previous suicide attempt by you or a family member -thyroid cancer or if someone in your family had thyroid cancer -an unusual or allergic reaction to liraglutide, medicines, foods, dyes, or preservatives -pregnant or trying to get pregnant -breast-feeding How should I use this medicine? This medicine is for injection under the skin of your upper leg, stomach area, or upper arm. You will be taught how to prepare and give this medicine. Use exactly as directed. Take your medicine at regular intervals. Do not take it more often than directed. It is important that you put your used needles and syringes in a special sharps container. Do not put them in a trash  can. If you do not have a sharps container, call your pharmacist or healthcare provider to get one. A special MedGuide will be given to you by the pharmacist with each prescription and refill. Be sure to read this information carefully each time. Talk to your pediatrician regarding the use of this medicine in children. Special care may be needed. Overdosage: If you think you have taken too much of this medicine contact a poison control center or emergency room at once. NOTE: This medicine is only for you. Do not share this medicine with others. What if I miss a dose? If you miss a dose, take it as soon as you can. If it is almost time for your next dose, take only that dose. Do not take double or extra doses. What may interact with this medicine? -acetaminophen -atorvastatin -birth control pills -digoxin -griseofulvin -lisinoprilMany medications may cause changes in blood sugar, these include: -alcohol containing beverages -aspirin and aspirin-like drugs -chloramphenicol -chromium -diuretics -female hormones, such as estrogens or progestins, birth control pills -heart medicines -isoniazid -female hormones or anabolic steroids -medications for weight loss -medicines for allergies, asthma, cold, or cough -medicines for mental problems -medicines called MAO inhibitors - Nardil, Parnate, Marplan, Eldepryl -niacin -NSAIDS, such as ibuprofen -pentamidine -phenytoin -probenecid -quinolone antibiotics such as ciprofloxacin, levofloxacin, ofloxacin -some herbal dietary supplements -steroid medicines such as prednisone or cortisone -thyroid hormonesSome medications can hide the warning symptoms of low blood sugar (hypoglycemia). You may need to monitor your blood sugar more closely if you are taking one of these medications. These include: -  beta-blockers, often used for high blood pressure or heart problems (examples include atenolol, metoprolol,  propranolol) -clonidine -guanethidine -reserpine This list may not describe all possible interactions. Give your health care provider a list of all the medicines, herbs, non-prescription drugs, or dietary supplements you use. Also tell them if you smoke, drink alcohol, or use illegal drugs. Some items may interact with your medicine. What should I watch for while using this medicine? Visit your doctor or health care professional for regular checks on your progress. A test called the HbA1C (A1C) will be monitored. This is a simple blood test. It measures your blood sugar control over the last 2 to 3 months. You will receive this test every 3 to 6 months. Learn how to check your blood sugar. Learn the symptoms of low and high blood sugar and how to manage them. Always carry a quick-source of sugar with you in case you have symptoms of low blood sugar. Examples include hard sugar candy or glucose tablets. Make sure others know that you can choke if you eat or drink when you develop serious symptoms of low blood sugar, such as seizures or unconsciousness. They must get medical help at once. Tell your doctor or health care professional if you have high blood sugar. You might need to change the dose of your medicine. If you are sick or exercising more than usual, you might need to change the dose of your medicine. Do not skip meals. Ask your doctor or health care professional if you should avoid alcohol. Many nonprescription cough and cold products contain sugar or alcohol. These can affect blood sugar. Liraglutide pens and cartridges should never be shared. Even if the needle is changed, sharing may result in passing of viruses like hepatitis or HIV. Wear a medical ID bracelet or chain, and carry a card that describes your disease and details of your medicine and dosage times. Patients and their families should watch out for worsening depression or thoughts of suicide. Also watch out for sudden changes in  feelings such as feeling anxious, agitated, panicky, irritable, hostile, aggressive, impulsive, severely restless, overly excited and hyperactive, or not being able to sleep. If this happens, especially at the beginning of treatment or after a change in dose, call your health care professional. What side effects may I notice from receiving this medicine? Side effects that you should report to your doctor or health care professional as soon as possible: -allergic reactions like skin rash, itching or hives, swelling of the face, lips, or tongue -breathing problems -fever, chills -loss of appetite -signs and symptoms of low blood sugar such as feeling anxious, confusion, dizziness, increased hunger, unusually weak or tired, sweating, shakiness, cold, irritable, headache, blurred vision, fast heartbeat, loss of consciousness -trouble passing urine or change in the amount of urine -unusual stomach pain or upset -vomiting Side effects that usually do not require medical attention (Report these to your doctor or health care professional if they continue or are bothersome.): -constipation -diarrhea -fatigue -headache -nausea This list may not describe all possible side effects. Call your doctor for medical advice about side effects. You may report side effects to FDA at 1-800-FDA-1088. Where should I keep my medicine? Keep out of the reach of children. Store unopened pen in a refrigerator between 2 and 8 degrees C (36 and 46 degrees F). Do not freeze or use if the medicine has been frozen. Protect from light and excessive heat. After you first use the pen, it can be stored  at room temperature between 15 and 30 degrees C (59 and 86 degrees F) or in a refrigerator. Throw away your used pen after 30 days or after the expiration date, whichever comes first. Do not store your pen with the needle attached. If the needle is left on, medicine may leak from the pen. NOTE: This sheet is a summary. It may not  cover all possible information. If you have questions about this medicine, talk to your doctor, pharmacist, or health care provider.    2016, Elsevier/Gold Standard. (2013-04-09 10:19:14)

## 2015-07-28 NOTE — Assessment & Plan Note (Signed)
BP Readings from Last 3 Encounters:  07/27/15 146/46  05/18/15 156/62  04/04/15 161/53   Patient now has a home BP monitor. She reports seeing high blood pressures at home, but has not recorded these. She reports adherence to current medications of Amlodipine 10 mg daily, Atenolol 100 mg daily, and Losartan-HCTZ 100-25 mg daily.  A/P: SBP mildly elevated with wide pulse pressure. No murmur of aortic insufficiency heard on exam. Patient has a BP log and will record her home blood pressures three times per week and bring with her on follow up. -Continue current medications.

## 2015-07-28 NOTE — Assessment & Plan Note (Signed)
Patient reports stress at home as she worries about the health of her friends. She says she has trouble sleeping at times because of this. She does have a TV in her bedroom and watches just prior to going to sleep. She tries taking a minute of rest to help relieve her stress, which does help.  A/P: Patient's stress is related to her worrying about others and has interfered with her sleep at times. I discussed relaxation techniques with her and told her to continue her current exercise routine as this can help. She says that she does feel more relaxed when going on her walks. I also discussed sleep hygiene techniques including establishing a bedtime routine 30 minutes prior to going to bed without TV or smartphone use. -continue exercise, relaxation, and sleep hygiene

## 2015-07-28 NOTE — Assessment & Plan Note (Signed)
Last A1C was 9.0 on 04/04/15. She reports adherence to home Novolog 70/30, 30 units AM and 20 units PM. She is also taking Metformin 500 mg BID, which is not increased as she experienced GI side effects at higher doses. She reports one episode of hypoglycemic symptoms prior to clinic arrival when her CBG was about 130. She felt dizzy, shaky, and lightheaded. She ate some candy which improved this. She denies any recent hyperglycemic episodes. She reports walking 30-45 minutes a day since our last visit. She is also adjusting her diet, eating more vegetables and cutting out sugary drinks.  A/P: A1c this visit is improved to 8.4. I discussed the option of adding Liraglutide (Victoza) to her regimen to continue to improve her A1C and assist with weight loss. She was hesitant about starting this as she saw the Victoza commercials and their side effects. I informed her about the risks and benefits of the medication, the administration, and the dosing, however she was still reluctant to start this now. She decided she will continue to work on her diet and exercise to bring her A1C down as she was encouraged with the improvement she made since February. She will reconsider this on next visit. Would try to avoid increasing her insulin as this will make it more difficult for her to lose weight. -Continue Novolog 70/30, 30 units am and 25 units pm -Continue diet and exercise -Discuss Liraglutide addition again on next visit, 0.6 mg daily for one week then 1.2 mg to decrease risk for GI side effects -repeat A1C in 3 months

## 2015-07-28 NOTE — Assessment & Plan Note (Addendum)
DEXA referral placed on last visit, but this has not been scheduled yet.  A/P: Will try to schedule DEXA for osteoporosis screening. Patient declined Prevnar this visit. -DEXA -Bisphosphonate therapy if osteoporosis seen

## 2015-07-30 NOTE — Progress Notes (Signed)
Internal Medicine Clinic Attending  Case discussed with Dr. Patel,Vishal at the time of the visit.  We reviewed the resident's history and exam and pertinent patient test results.  I agree with the assessment, diagnosis, and plan of care documented in the resident's note.  

## 2015-08-09 ENCOUNTER — Other Ambulatory Visit: Payer: Self-pay | Admitting: *Deleted

## 2015-08-31 ENCOUNTER — Ambulatory Visit: Payer: Medicare Other | Admitting: Podiatry

## 2015-08-31 ENCOUNTER — Telehealth: Payer: Self-pay | Admitting: *Deleted

## 2015-08-31 NOTE — Telephone Encounter (Signed)
Pt states she is feeling well, working on trying to cut out all added salt, trying to get girlfriends to walk with her or go to [facility] to walk in the A/C since it is so hot outside. Pt states she had a b/p cuff given to her when she had her last OV but that cuff is no longer working so she intends to go to [employer] this weekend to buy a cuff. RN offered to call her back in a month to see how her b/p readings are doing - pt agrees to this call and states she will write down her b/p so she can tell RN how she is doing.  Pt states she is checking her blood sugar twice a day and is running around 175-180 in the evenings and 165 in the AM - states her goal for her fasting BS is "about 150" bc if it get "too low, I feel shaky and I can tell it is too low." - pt states she is taking Novolog 70/30, 30 Units in the AM and 25 units in the PM and continues to take her Metformin 500 mg BID.   Pt congratulated on trying to care for self so well with diet and exercise and medicine - pt denies additional needs at present and is willing to talk again in about one month to see how b/p is doing also. Yvonna Alanis, RN, 08/31/15 - 1:51 PM

## 2015-10-03 ENCOUNTER — Other Ambulatory Visit: Payer: Self-pay | Admitting: Internal Medicine

## 2015-10-10 ENCOUNTER — Other Ambulatory Visit: Payer: Self-pay | Admitting: Internal Medicine

## 2015-10-10 DIAGNOSIS — Z1231 Encounter for screening mammogram for malignant neoplasm of breast: Secondary | ICD-10-CM

## 2015-10-11 ENCOUNTER — Telehealth: Payer: Self-pay | Admitting: *Deleted

## 2015-10-11 NOTE — Telephone Encounter (Signed)
Calling pt to see how she is doing managing her DM and HTN - able to reach her at 1P but she was "on her way out to an appt" so made arrangements to call back after 5 P. However, pt has no VM, so when called at [facility] and 6:25 P, pt did not answer and unable to leave message. Leroy Sea, 10/11/15, 6:27 P

## 2015-10-12 NOTE — Telephone Encounter (Signed)
Pt states she went to dr office yesterday and her b/p was 150/85 and she was disappointed it was not a lower "systolic" but knew she had been rushing to multiple appts . States she is watching her salt, not adding and not even cooking with it. She mentioned taking time to rest when she got tired after all the appt and noted rest could help decrease stress and b/p Pt also states her fasting blood sugar today was 146, which is approximately what she runs in the AM. Last evening it was 160 and confirms the highest it has been lately non-fasting was 190 and she knew what she had eaten to cause that increase. She is resisting sweets and trying to lose wt. We discussed the "push back" method of just decreasing quantities, using non-salt flavoring.  She states she has gotten all her meds refilled and takes on schedule now, taking AM meds between 8:30-9A each AM and evening meds between 5:30-6P each day, so that she can be sure she takes them all and "on time."  She states she cannot safely walk in her neighborhood and her arthritis in her knees sometimes hurts but she tries to use the "green exercise bands" to stretch that she got in PT and often goes to [facility] just to walk up and down the aisles to get exercise. She remembered she was scheduled for a mmg and DEXA in Sept and has an appt with Dr. Sherrye Payor on Oct. 4. Pt congratulated on trying to get healthier on so many levels, that both BP and BS getting close to goal and that healthy eating and walking will definitely help control both BP and BS so all efforts will help. Pt instructed to call for any concerns, to be proud of all her efforts and work. Pt expressed appreciation for the follow up and will see [place] in about a month.  Chelsea Alanis, RN, 8/3/0/17, 11:48 AM

## 2015-10-31 ENCOUNTER — Other Ambulatory Visit: Payer: Self-pay | Admitting: Internal Medicine

## 2015-10-31 ENCOUNTER — Ambulatory Visit
Admission: RE | Admit: 2015-10-31 | Discharge: 2015-10-31 | Disposition: A | Discharge status: Home / Self Care | Payer: Medicare Other | Source: Ambulatory Visit | Attending: Student in an Organized Health Care Education/Training Program | Admitting: Student in an Organized Health Care Education/Training Program

## 2015-10-31 DIAGNOSIS — E2839 Other primary ovarian failure: Secondary | ICD-10-CM

## 2015-10-31 DIAGNOSIS — I10 Essential (primary) hypertension: Secondary | ICD-10-CM

## 2015-10-31 DIAGNOSIS — Z1231 Encounter for screening mammogram for malignant neoplasm of breast: Secondary | ICD-10-CM

## 2015-10-31 DIAGNOSIS — M85851 Other specified disorders of bone density and structure, right thigh: Secondary | ICD-10-CM | POA: Diagnosis not present

## 2015-10-31 DIAGNOSIS — Z780 Asymptomatic menopausal state: Secondary | ICD-10-CM | POA: Diagnosis not present

## 2015-10-31 MED ORDER — AMLODIPINE BESYLATE 10 MG PO TABS: 10.0000 mg | ORAL_TABLET | Freq: Every day | ORAL | 3 refills | Status: DC

## 2015-11-01 DIAGNOSIS — H2512 Age-related nuclear cataract, left eye: Secondary | ICD-10-CM | POA: Diagnosis not present

## 2015-11-01 DIAGNOSIS — Z961 Presence of intraocular lens: Secondary | ICD-10-CM | POA: Diagnosis not present

## 2015-11-01 DIAGNOSIS — Z794 Long term (current) use of insulin: Secondary | ICD-10-CM | POA: Diagnosis not present

## 2015-11-01 DIAGNOSIS — E109 Type 1 diabetes mellitus without complications: Secondary | ICD-10-CM | POA: Diagnosis not present

## 2015-11-01 DIAGNOSIS — Z7984 Long term (current) use of oral hypoglycemic drugs: Secondary | ICD-10-CM | POA: Diagnosis not present

## 2015-11-01 DIAGNOSIS — E103593 Type 1 diabetes mellitus with proliferative diabetic retinopathy without macular edema, bilateral: Secondary | ICD-10-CM | POA: Diagnosis not present

## 2015-11-01 LAB — HM DIABETES EYE EXAM

## 2015-11-08 ENCOUNTER — Telehealth: Payer: Self-pay | Admitting: *Deleted

## 2015-11-10 NOTE — Telephone Encounter (Addendum)
There has been no voice mail available for original call 9/26 or today 11/10/15. Will try again this evening. Yvonna Alanis, RN, 11/10/15, 3:20 P  Unable to reach pt on 9/28 or 10/2915. However, did get to f/u with pt at her clinic visit in the acute care clinic today. Her priority is now to get help with her sore foot and she was aware the inflammation could affect her BS and the pain could affect her BP. Tentative plan made to see wound care or return to Dr. Sharol Given about her foot. She will keep her PCP appt on 12/30/15/ Yvonna Alanis, RN, 11/17/15, 7:48 PM

## 2015-11-15 ENCOUNTER — Telehealth: Payer: Self-pay | Admitting: Internal Medicine

## 2015-11-15 NOTE — Telephone Encounter (Signed)
APT. REMINDER CALL, NO ANSWER, NO VOICEMAIL °

## 2015-11-16 ENCOUNTER — Ambulatory Visit (INDEPENDENT_AMBULATORY_CARE_PROVIDER_SITE_OTHER): Payer: Medicare Other | Admitting: Internal Medicine

## 2015-11-16 VITALS — BP 156/49 | Temp 98.1°F | Wt 250.0 lb

## 2015-11-16 DIAGNOSIS — I10 Essential (primary) hypertension: Secondary | ICD-10-CM

## 2015-11-16 DIAGNOSIS — E11319 Type 2 diabetes mellitus with unspecified diabetic retinopathy without macular edema: Secondary | ICD-10-CM | POA: Diagnosis not present

## 2015-11-16 DIAGNOSIS — M8588 Other specified disorders of bone density and structure, other site: Secondary | ICD-10-CM

## 2015-11-16 DIAGNOSIS — Z0000 Encounter for general adult medical examination without abnormal findings: Secondary | ICD-10-CM

## 2015-11-16 DIAGNOSIS — E1165 Type 2 diabetes mellitus with hyperglycemia: Secondary | ICD-10-CM

## 2015-11-16 DIAGNOSIS — Z79899 Other long term (current) drug therapy: Secondary | ICD-10-CM

## 2015-11-16 DIAGNOSIS — Z794 Long term (current) use of insulin: Secondary | ICD-10-CM | POA: Diagnosis not present

## 2015-11-16 DIAGNOSIS — E113593 Type 2 diabetes mellitus with proliferative diabetic retinopathy without macular edema, bilateral: Secondary | ICD-10-CM

## 2015-11-16 LAB — GLUCOSE, CAPILLARY: GLUCOSE-CAPILLARY: 160 mg/dL — AB (ref 65–99)

## 2015-11-16 LAB — POCT GLYCOSYLATED HEMOGLOBIN (HGB A1C): Hemoglobin A1C: 8

## 2015-11-16 MED ORDER — INSULIN ASPART PROT & ASPART (70-30 MIX) 100 UNIT/ML PEN: PEN_INJECTOR | SUBCUTANEOUS | 6 refills | Status: DC

## 2015-11-16 NOTE — Assessment & Plan Note (Signed)
Bone density scan on 10/31/15 reveals a T-score of -1.3 at the femur. L-2 was excluded due to degenerative changes. She has not had any fragility fractures in the past. 10-year fracture probability by FRAX score by my calculation is <1% for hip and 3% for any major osteoporotic fracture. -No indication for bisphosphonate therapy at this time -Advised patient that she may use VitD-Calcium supplement

## 2015-11-16 NOTE — Patient Instructions (Addendum)
It was a pleasure to see you again Chelsea [name].  You are doing great, pleas continue to work on the diet and exercise changes you are making.  Your Hgb A1c has decreased from 8.4 to 8.0. We will increase your nighttime Novolog 70/30 to 28 units. Continue to take 30 units in the morning. Continue your Metformin 500 mg twice a day.  Your bone density scan shows that you do not have osteoporosis but there is some thinning of the bones called osteopenia.  We will call you in 2-4 weeks to see how your sugars are doing.  Follow up with [place] in 3 months or sooner if needed.
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
## 2015-11-16 NOTE — Assessment & Plan Note (Signed)
Patient declined flu shot today.

## 2015-11-16 NOTE — Assessment & Plan Note (Signed)
Hgb A1c on 07/27/15 was 8.4, today is 8.0. She reports adherence to Novolog 70/30, 30 units am and 25 units pm. She takes Metformin XR 500 mg BID, had intolerance with GI issues on higher doses. She checks her blood sugar at home but forgot her meter. She reports morning sugars between 180-190 and evening values of 145-160 sometimes 170-180. She denies any lows or highs. She is continuing to work on diet and exercise changes. She does admit some setback recently due to life stresses including the recent passing of her sister-in-law. She does say she will be starting to work out at a gym with a family member next week. We discussed the possibility of starting Victoza on last visit, however she is still resistant to start additional medications at this time.  A1c with slight improvement. Will increase her evening Novolog 70/30 to 28 units as morning levels seem to be higher per patient report. Will continue with diet/exercise modifications and continue to address additional therapies on follow up. -Increase Novolog 70/30 from 25 units to 28 units in the evening -Continue Novolog 70/30 30 units am -Continue diet/exercise changes -Foot exam completed today -Will check in on patient in 2-4 weeks to see how her blood sugars are doing -f/u in 3 months -Continue to consider additional medical therapy on follow up

## 2015-11-16 NOTE — Assessment & Plan Note (Signed)
BP Readings from Last 3 Encounters:  11/16/15 (!) 156/49  07/27/15 (!) 146/46  05/18/15 (!) 156/62    BP 156/49 this visit. SBP still mildly elevated with wide pulse pressure. She reports adherence to current medications of Amlodipine 10 mg daily, Atenolol 100 mg daily, and Losartan-HCTZ 100-25 mg daily. She has a BP machine at home, which she says is not working. She occasionally checks her BP at the store, reporting a BP of 150/80 this morning. She did take her medications this morning.  Patient with continued mildly elevated SBP while on max dose of current medications. Spironolactone can be considered, however she did have a borderline potassium of 5.1 on last BMP. Will continue to monitor on current medications with diet/lifestyle modifications.

## 2015-11-18 NOTE — Progress Notes (Signed)
Internal Medicine Clinic Attending  Case discussed with Dr. Patel,Vishal at the time of the visit.  We reviewed the resident's history and exam and pertinent patient test results.  I agree with the assessment, diagnosis, and plan of care documented in the resident's note.  

## 2015-11-29 ENCOUNTER — Telehealth: Payer: Self-pay | Admitting: Dietician

## 2015-11-29 ENCOUNTER — Other Ambulatory Visit: Payer: Self-pay | Admitting: Internal Medicine

## 2015-11-29 MED ORDER — ALBUTEROL SULFATE (2.5 MG/3ML) 0.083% IN NEBU: 2.5000 mg | INHALATION_SOLUTION | RESPIRATORY_TRACT | 1 refills | Status: DC | PRN

## 2015-11-29 NOTE — Telephone Encounter (Signed)
calling patient to follow up on blood sugars since her insulin was increased: she says they are "A little better", she reports her insulin was increased in the evening from 25 to 28 units and this improved her evening blood sugars. Her current doses are 30 units in the morning, between 8-9 am, 28u in the evening between 6-7. Her blood sugars are bewteen 160-180 in morning,  Evenings:145-130-115. She has not checked a before bed blood sugar.   We discussed the timing of her insulin and that the evening dose affects the bedtime and morning readings.(and the morning dose affects the evening readings.  She verbalized understanding but may need review of this.  She does not want to come in for a follow up MNT appointment at this time because she is trying to get into an exercise program/routine.  Reviewed exercise precautions and she agreed to have them mailed to her. She also agreed to call if needed and for a follow up when ready.

## 2015-11-29 NOTE — Telephone Encounter (Signed)
Pt states she needs refill of medication that goes in there breathing machine. hasnt gotten refilled since she [facility] from [facility].  [facility]

## 2015-12-07 ENCOUNTER — Other Ambulatory Visit: Payer: Self-pay

## 2015-12-07 DIAGNOSIS — Z794 Long term (current) use of insulin: Principal | ICD-10-CM

## 2015-12-07 DIAGNOSIS — E113593 Type 2 diabetes mellitus with proliferative diabetic retinopathy without macular edema, bilateral: Secondary | ICD-10-CM

## 2015-12-07 MED ORDER — GLUCOSE BLOOD VI STRP: ORAL_STRIP | 5 refills | Status: DC

## 2015-12-07 NOTE — Telephone Encounter (Signed)
glucose blood (ACCU-CHEK AVIVA PLUS) test strip, refill request @ [facility] on [address]

## 2015-12-07 NOTE — Telephone Encounter (Signed)
Patient called diabetes educator asking about her request for test strips. I told her it was in and waiting on the doctor to respond.

## 2015-12-08 ENCOUNTER — Other Ambulatory Visit: Payer: Self-pay

## 2015-12-08 ENCOUNTER — Other Ambulatory Visit: Payer: Self-pay | Admitting: Internal Medicine

## 2015-12-08 DIAGNOSIS — Z794 Long term (current) use of insulin: Principal | ICD-10-CM

## 2015-12-08 DIAGNOSIS — E113593 Type 2 diabetes mellitus with proliferative diabetic retinopathy without macular edema, bilateral: Secondary | ICD-10-CM

## 2015-12-08 MED ORDER — GLUCOSE BLOOD VI STRP: ORAL_STRIP | 5 refills | Status: DC

## 2015-12-08 NOTE — Telephone Encounter (Signed)
Pt has a current script but her pharm does not take part B, they are calling pt to see if she wants them transferred out again

## 2015-12-08 NOTE — Telephone Encounter (Signed)
Requesting test strips to be filled.

## 2015-12-12 NOTE — Telephone Encounter (Signed)
Rx sent to [facility] last week. Patient called Friday and left message that she still could not get her strips. Diabetes educator called [facility] today and they said her strips are ready for pick up and there is no charge. Patient called and notified.

## 2015-12-14 ENCOUNTER — Ambulatory Visit (INDEPENDENT_AMBULATORY_CARE_PROVIDER_SITE_OTHER): Payer: Medicare Other | Admitting: Podiatry

## 2015-12-14 ENCOUNTER — Encounter: Payer: Self-pay | Admitting: Podiatry

## 2015-12-14 VITALS — Resp 16 | Ht 63.0 in | Wt 246.0 lb

## 2015-12-14 DIAGNOSIS — L84 Corns and callosities: Secondary | ICD-10-CM

## 2015-12-14 DIAGNOSIS — B351 Tinea unguium: Secondary | ICD-10-CM

## 2015-12-14 DIAGNOSIS — Q828 Other specified congenital malformations of skin: Secondary | ICD-10-CM

## 2015-12-14 DIAGNOSIS — M79676 Pain in unspecified toe(s): Secondary | ICD-10-CM | POA: Diagnosis not present

## 2015-12-14 DIAGNOSIS — E1149 Type 2 diabetes mellitus with other diabetic neurological complication: Secondary | ICD-10-CM

## 2015-12-14 NOTE — Progress Notes (Signed)
[name], female   DOB: 1950-05-27, 65 y.o.   MRN: [id number] Complaint:  Visit Type: Patient returns to my office for continued preventative foot care services. Complaint: Patient states" my nails have grown long and thick and become painful to walk and wear shoes" Patient has been diagnosed with DM with neuropathy He presents for preventative foot care services. No changes to ROS.  Painful callus under the balls of both feet.  Podiatric Exam: Vascular: dorsalis pedis and posterior tibial pulses are palpable bilateral. Capillary return is immediate. Temperature gradient is WNL. Skin turgor WNL  Sensorium: Diminished Semmes Weinstein monofilament test. Normal tactile sensation bilaterally. Nail Exam: Pt has thick disfigured discolored nails with subungual debris noted bilateral entire nail hallux through fifth toenails Ulcer Exam: There is no evidence of ulcer or pre-ulcerative changes or infection. Orthopedic Exam: Muscle tone and strength are WNL. No limitations in general ROM. No crepitus or effusions noted. Foot type and digits show no abnormalities. DJD 1st MPJ B/L. Skin: No Porokeratosis. No infection or ulcers.  Callus under forefoot B/L  Diagnosis:  Tinea unguium, Pain in right toe, pain in left toes  Treatment & Plan Procedures and Treatment: Consent by patient was obtained for treatment procedures. The patient understood the discussion of treatment and procedures well. All questions were answered thoroughly reviewed. Debridement of mycotic and hypertrophic toenails, 1 through 5 bilateral and clearing of subungual debris. No ulceration, no infection noted. Debride callus.   .Return Visit-Office Procedure: Patient instructed to return to the office for a follow up visit 3 months for continued evaluation and treatment.   Gardiner Barefoot DPM

## 2015-12-26 ENCOUNTER — Other Ambulatory Visit: Payer: Self-pay | Admitting: Internal Medicine

## 2016-01-03 ENCOUNTER — Telehealth: Payer: Self-pay | Admitting: Dietician

## 2016-01-03 NOTE — Telephone Encounter (Signed)
Called patient to follow up on how she is caring for her Diabetes.  Nutrition- trying to eat what she is supposed to eat- her family is bringing most of the food and  is cooking chicken and greens to help her stay focused on eating right  over the holiday  Medicines- 30 units insulin in the am, 28 units in the PM & metformin which does not upset her stomachs now that is a lower dose. She is still thinking about Victoza, we discussed pros and cons.  Self Monitoring- she reports her am blood sugar are a bit better 140-170-200 range  Exericise- is no longer going to the [facility] because her sister was her ride and she stopped going.  No needs identified. Next appointment is 02/15/16.  P:Call back in 1 month

## 2016-01-25 ENCOUNTER — Other Ambulatory Visit: Payer: Self-pay | Admitting: Internal Medicine

## 2016-01-25 NOTE — Telephone Encounter (Signed)
appt with pcp on 02/15/2016

## 2016-02-01 ENCOUNTER — Other Ambulatory Visit: Payer: Self-pay

## 2016-02-01 MED ORDER — ALBUTEROL SULFATE (2.5 MG/3ML) 0.083% IN NEBU: 2.5000 mg | INHALATION_SOLUTION | RESPIRATORY_TRACT | 1 refills | Status: DC | PRN

## 2016-02-01 NOTE — Telephone Encounter (Signed)
albuterol (PROVENTIL) (2.5 MG/3ML) 0.083% nebulizer solution, refill request.

## 2016-02-14 ENCOUNTER — Telehealth: Payer: Self-pay | Admitting: Internal Medicine

## 2016-02-14 NOTE — Telephone Encounter (Signed)
APT. REMINDER CALL, NO ANSWER, NO VOICEMAIL °

## 2016-02-15 ENCOUNTER — Ambulatory Visit (INDEPENDENT_AMBULATORY_CARE_PROVIDER_SITE_OTHER): Payer: Medicare Other | Admitting: Internal Medicine

## 2016-02-15 ENCOUNTER — Encounter (INDEPENDENT_AMBULATORY_CARE_PROVIDER_SITE_OTHER): Payer: Self-pay

## 2016-02-15 ENCOUNTER — Encounter: Payer: Self-pay | Admitting: Internal Medicine

## 2016-02-15 VITALS — BP 160/59 | HR 76 | Temp 98.2°F | Ht 63.0 in | Wt 256.4 lb

## 2016-02-15 DIAGNOSIS — Z0000 Encounter for general adult medical examination without abnormal findings: Secondary | ICD-10-CM

## 2016-02-15 DIAGNOSIS — E113593 Type 2 diabetes mellitus with proliferative diabetic retinopathy without macular edema, bilateral: Secondary | ICD-10-CM

## 2016-02-15 DIAGNOSIS — Z79899 Other long term (current) drug therapy: Secondary | ICD-10-CM | POA: Diagnosis not present

## 2016-02-15 DIAGNOSIS — L853 Xerosis cutis: Secondary | ICD-10-CM | POA: Insufficient documentation

## 2016-02-15 DIAGNOSIS — E11319 Type 2 diabetes mellitus with unspecified diabetic retinopathy without macular edema: Secondary | ICD-10-CM

## 2016-02-15 DIAGNOSIS — I10 Essential (primary) hypertension: Secondary | ICD-10-CM

## 2016-02-15 DIAGNOSIS — Z794 Long term (current) use of insulin: Secondary | ICD-10-CM | POA: Diagnosis not present

## 2016-02-15 LAB — GLUCOSE, CAPILLARY: Glucose-Capillary: 91 mg/dL (ref 65–99)

## 2016-02-15 LAB — POCT GLYCOSYLATED HEMOGLOBIN (HGB A1C): Hemoglobin A1C: 7.9

## 2016-02-15 MED ORDER — CETAPHIL MOISTURIZING EX LOTN
1.0000 | TOPICAL_LOTION | CUTANEOUS | 0 refills | Status: DC | PRN
Start: 2016-02-15 — End: 2019-10-23

## 2016-02-15 NOTE — Assessment & Plan Note (Signed)
BP Readings from Last 3 Encounters:  02/15/16 (!) 160/59  11/16/15 (!) 156/49  07/27/15 (!) 146/46   BP 160/59 this visit. She reports adherence to current medications of Amlodipine 10 mg daily, Atenolol 100 mg daily, and Losartan-HCTZ 100-25 mg daily.  BP continues to be above goal with max doses of current medications. Her most recent BMET showed a borderline potassium of 5.1. Will recheck BMET today to assess K and creatinine. If potassium improved, may consider addition of Spironolactone for BP control. I discussed this possibility with patient, however she is resistant to adding additional medications at this time. -Continue Amlodipine 10 mg daily, Atenolol 100 mg daily, and Losartan-HCTZ 100-25 mg daily -Check BMET -Diet/lifestyle changes
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
## 2016-02-15 NOTE — Assessment & Plan Note (Signed)
Hgb A1c on 11/16/15 was 8.0. She reports adherence to Novolog 70/30, 30 units am and 28 units pm. She takes Metformin XR 500 mg BID, had intolerance with GI issues on higher doses. We discussed the possibility of starting Victoza on last visit, however she is still resistant to start additional medications at this time. She denies any recent hypoglycemic or hyperglycemic symptoms. She does report recent dietary indiscretion since November due to the holidays. She reports having her annual eye exam at [facility] in 2017.  Hgb A1c today is 7.9. Patient has been highly resistant to additional medication to further reduce her a1c. I think a goal Hgb A1c of <8.0 is appropriate for her. I have recommended continued lifestyle changes and she says she is going to work on her diet. I have offered to have her see our Diabetic Counselor, [name] Plyler RD, however she declined. -Continue Novolog 70/30 30 units am 28 units pm -Check CBGs at least twice daily -Continue Metformin XR 500 mg BID -Diet/Lifestyle changes -Will try to obtain records from [facility] to update her most recent eye exam -f/u in 3 months

## 2016-02-15 NOTE — Assessment & Plan Note (Signed)
Patient reports dry skin and pruritis of her arms and legs which she is causing her to scratch herself. She reports using Dial and Dove soap. She has tried a moisturizing lotion without relief. She reports not drinking much water. Her dry skin flared up with the cold weather.  No significant dry skin of extremities seen on exam. Recommend that patient d/c Dial soap and continue Dove. Also advised her to try Cetaphil lotion as well. She is encouraged to avoid scratching.

## 2016-02-15 NOTE — Patient Instructions (Addendum)
It was a pleasure to see you again Chelsea [name].  Your Hgb A1c today is 7.9. Please continue your current medications and work on your diet. Call [name] Plyler if you have questions about diet.  Please continue to check your blood sugar twice a day and bring your meter on follow up. We have talked about maybe adding another medication to bring your A1c down. Please continue to think about this.  For your dry skin, please continue the Dove soap. Stop Dial soap. You can try Cetaphil lotion to help with the dryness and itching. Try to drink enough water during the day.  Please follow up with me in 3 months or sooner if needed.

## 2016-02-15 NOTE — Assessment & Plan Note (Signed)
Patient declines flu shot and PNA vaccine today.

## 2016-02-16 LAB — BMP8+ANION GAP
ANION GAP: 21 mmol/L — AB (ref 10.0–18.0)
BUN/Creatinine Ratio: 23 (ref 12–28)
BUN: 25 mg/dL (ref 8–27)
CHLORIDE: 99 mmol/L (ref 96–106)
CO2: 22 mmol/L (ref 18–29)
Calcium: 9.6 mg/dL (ref 8.7–10.3)
Creatinine, Ser: 1.08 mg/dL — ABNORMAL HIGH (ref 0.57–1.00)
GFR calc Af Amer: 61 mL/min/{1.73_m2} (ref >59)
GFR calc non Af Amer: 53 mL/min/{1.73_m2} — ABNORMAL LOW (ref >59)
Glucose: 104 mg/dL — ABNORMAL HIGH (ref 65–99)
POTASSIUM: 5 mmol/L (ref 3.5–5.2)
Sodium: 142 mmol/L (ref 134–144)

## 2016-02-17 NOTE — Progress Notes (Signed)
Internal Medicine Clinic Attending  Case discussed with Dr. Patel,Vishal at the time of the visit.  We reviewed the resident's history and exam and pertinent patient test results.  I agree with the assessment, diagnosis, and plan of care documented in the resident's note.  

## 2016-03-14 ENCOUNTER — Ambulatory Visit: Payer: Medicare Other | Admitting: Podiatry

## 2016-03-22 ENCOUNTER — Ambulatory Visit: Payer: Medicare Other | Admitting: Podiatry

## 2016-03-29 NOTE — Addendum Note (Signed)
Addended by: Forde Dandy on: 03/29/2016 07:08 PM   Modules accepted: Orders

## 2016-04-07 IMAGING — MG MM DIGITAL SCREENING BILAT
4 series · 4 of 4 positions shown · non-contrast
Comparison: Previous exam(s).

CLINICAL DATA: Screening.

EXAM:
DIGITAL SCREENING BILATERAL MAMMOGRAM WITH CAD

[R CC]
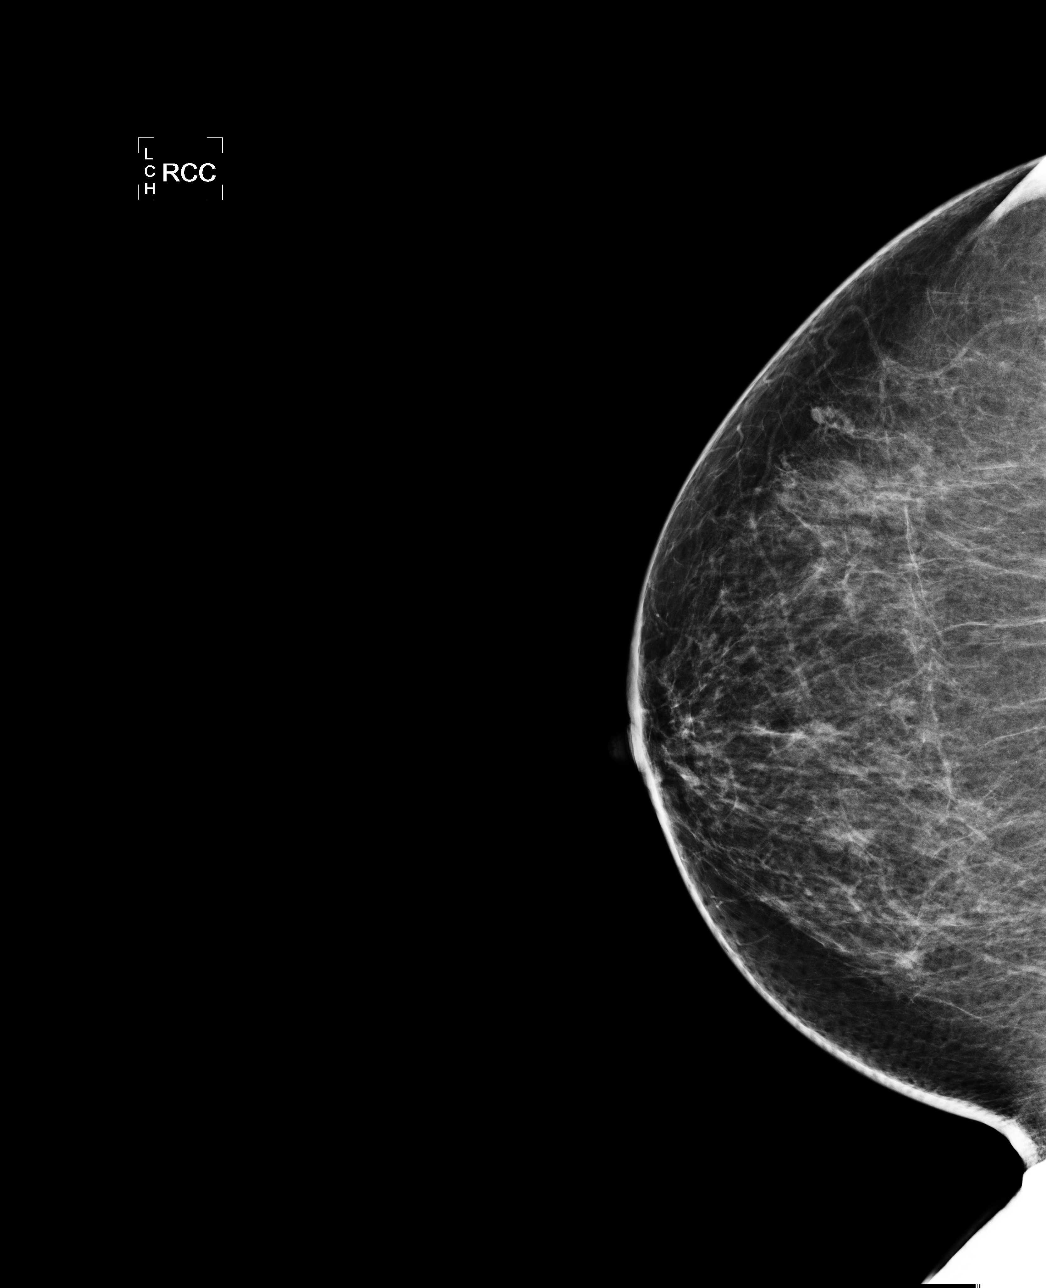

[R MLO]
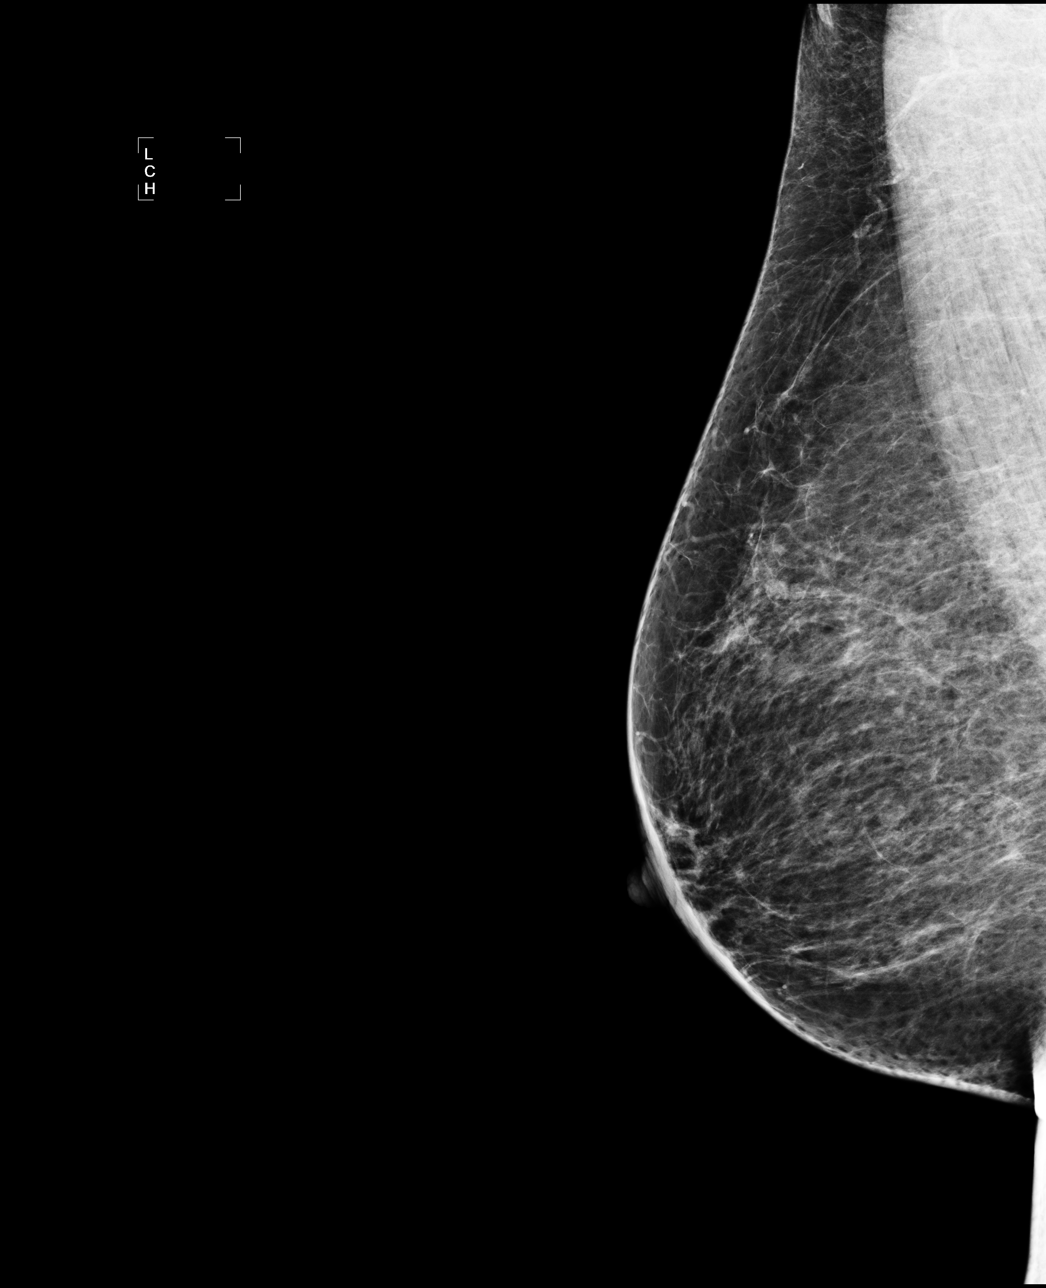

[L CC]
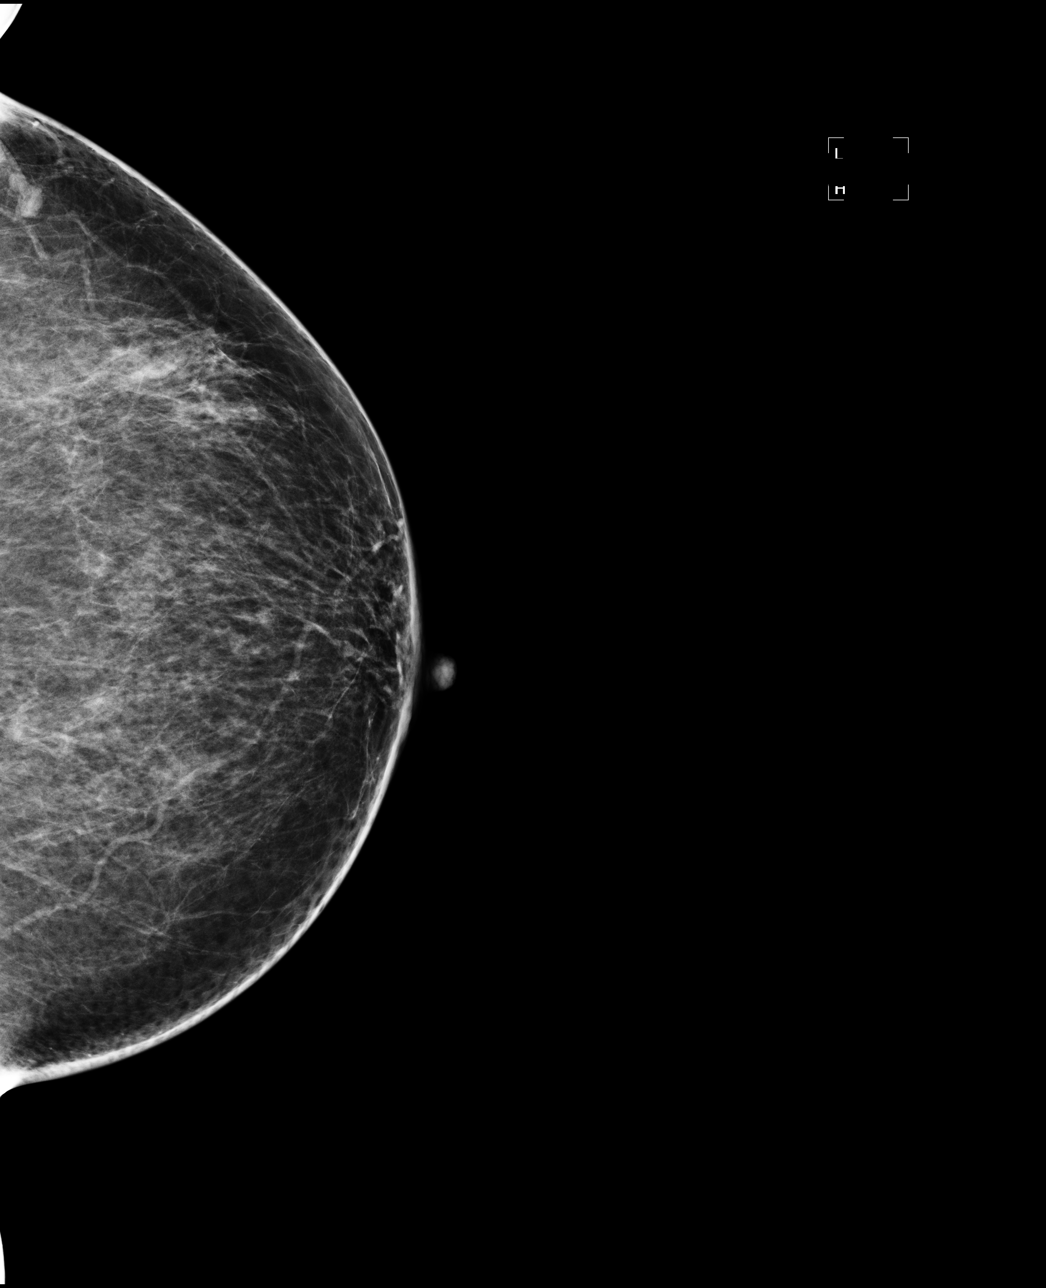

[L MLO]
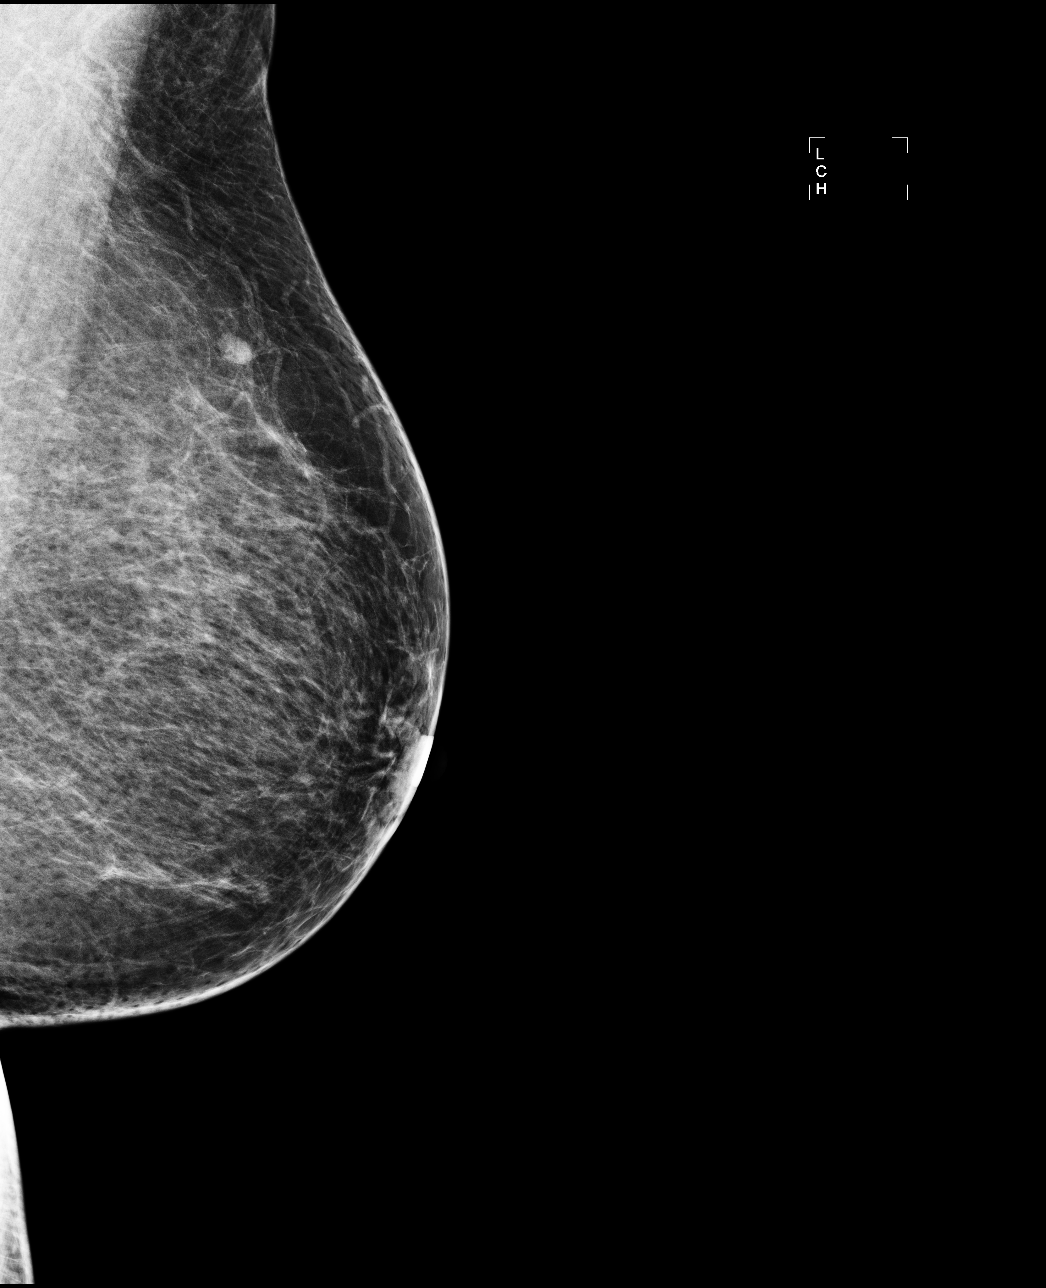

[4 of 4 positions shown; findings below may reference images not displayed]

ACR Breast Density Category b: There are scattered areas of
fibroglandular density.
FINDINGS: In the right breast, possible distortion in the medial breast
warrants further evaluation. In the left breast, no findings
suspicious for malignancy. Images were processed with CAD.
IMPRESSION: Further evaluation is suggested for possible distortion in the right
breast.

RECOMMENDATION:
Diagnostic mammogram and possibly ultrasound of the right breast.
(Code:8S-H-88G)

The patient will be contacted regarding the findings, and additional
imaging will be scheduled.

BI-RADS CATEGORY  0: Incomplete. Need additional imaging evaluation
and/or prior mammograms for comparison.

## 2016-04-09 ENCOUNTER — Telehealth: Payer: Self-pay | Admitting: *Deleted

## 2016-04-09 NOTE — Telephone Encounter (Signed)
Patient called stating her legs are very itchy & flaky, has scratched them until they bled. Has been using dove soap & cetaphil lotion but not helping. Also said both her ankles has turned dark colored. Denies bruising, denies swelling, not cool to touch. She said "it's just dark looking" Advised not to scratch areas & cover with clean dressing after cleaning the area.  She's asking if there's something else she can use. Would like an anti itch pill if it's possible. Would like it to be sent to [facility] pharmacy. Appt set for 3/1 @ 8:45 am (the earliest she can do due to transportation set up).

## 2016-04-09 NOTE — Telephone Encounter (Signed)
Pt calling asking to speak with nurse again

## 2016-04-11 NOTE — Telephone Encounter (Signed)
Pt does not have vmail, no answer each time called

## 2016-04-12 ENCOUNTER — Ambulatory Visit (INDEPENDENT_AMBULATORY_CARE_PROVIDER_SITE_OTHER): Payer: Medicare Other | Admitting: Internal Medicine

## 2016-04-12 ENCOUNTER — Encounter (INDEPENDENT_AMBULATORY_CARE_PROVIDER_SITE_OTHER): Payer: Self-pay

## 2016-04-12 VITALS — BP 127/50 | HR 66 | Temp 97.6°F | Wt 255.5 lb

## 2016-04-12 DIAGNOSIS — Z79899 Other long term (current) drug therapy: Secondary | ICD-10-CM | POA: Diagnosis not present

## 2016-04-12 DIAGNOSIS — I8310 Varicose veins of unspecified lower extremity with inflammation: Secondary | ICD-10-CM

## 2016-04-12 DIAGNOSIS — I10 Essential (primary) hypertension: Secondary | ICD-10-CM | POA: Diagnosis not present

## 2016-04-12 DIAGNOSIS — I8312 Varicose veins of left lower extremity with inflammation: Secondary | ICD-10-CM

## 2016-04-12 DIAGNOSIS — I8311 Varicose veins of right lower extremity with inflammation: Secondary | ICD-10-CM | POA: Diagnosis not present

## 2016-04-12 DIAGNOSIS — B9689 Other specified bacterial agents as the cause of diseases classified elsewhere: Secondary | ICD-10-CM | POA: Diagnosis not present

## 2016-04-12 MED ORDER — BETAMETHASONE DIPROPIONATE 0.05 % EX CREA: TOPICAL_CREAM | Freq: Two times a day (BID) | CUTANEOUS | 0 refills | Status: DC

## 2016-04-12 MED ORDER — HYDROXYZINE HCL 25 MG PO TABS: 25.0000 mg | ORAL_TABLET | Freq: Every day | ORAL | 2 refills | Status: DC

## 2016-04-12 NOTE — Telephone Encounter (Signed)
No answer
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
## 2016-04-12 NOTE — Patient Instructions (Signed)
We saw you today for your leg itching.   I have sent a steroid creme that you will apply to your legs twice a day. I have sent a prescription for Atarax (hydroxyzine); take one tablet before bed to help with the itching and sleep.  You will be contacted about setting up the special blood pressure measurements in your legs.  We measured you for compression stockings. Once you get them, please wear them while walking; when you are at home and resting, please prop up your legs to help with the swelling which will help with the skin itching as well.

## 2016-04-15 ENCOUNTER — Encounter: Payer: Self-pay | Admitting: Internal Medicine

## 2016-04-15 NOTE — Assessment & Plan Note (Signed)
Patient with h/o hypertension, compliant with amlodipine 10mg  daily, atenolol 100mg  daily and losartan-HCTZ 100-25mg  daily. BP on initial read is elevated to SBP of 150's; patient denies CP, shortness of breath, vision or hearing changes or headache. Repeat BP is 127/54. BMet was checked last visit for evaluation of K as it has been in high-normal range; there was consideration for adding spironolactone if K improved. K still 5.0 and BP seemed better on recheck so no changes to regimen at this time.  Plan: --continue current regimen

## 2016-04-15 NOTE — Assessment & Plan Note (Signed)
Patient endorses >36mo h/o bil LE xerosis, pruritus and skin changes. Patient has been using cetaphil lotion which she does not think alleviates symptoms; she has used vaseline with some relief. She endorses using sensitive soap and otherwise lotions/cremes without fragrance. She does report switching laundry detergents from ALL sensitive to TIDE but does not remember when this change occurred. She denies pruritus on other parts of her body which make this change less likely to be causing her present symptoms. Patient was evaluated by PCP a couple of weeks ago; she was started on Betamethasone creme; on addressing this with patient, it is uncertain that she has used this. She states that the pruritus is increasing, spreading from the initial inflicted area of her right ankle to the rest of her RLE and now the LLE as well. She is concerned she has caused an infection due to excoriations. Her sleep is limited due to the discomfort.   On exam, patient has bilateral LE sclerosis, dark discoloration of skin L>R. LLE has involvement circumferentially to about 3in below knee; RLE has involvement mostly of medial region. Her LE demonstrate clasic inverted champagne bottle shape. She has a few sites of excoriation without tenderness, induration or increased warmth that would be concerning for infection. She has intact distal pulses. Symptoms and exam point to likely diagnosis of lipodermatosclerosis; will need to formally evaluate LE vasculature with ABI's as this can be compromised with continued sclerosis.   Plan: --ABI's --provided patient with fitted below the knee compression stockings with advised use/elevation of legs to decrease edema and continued inflammation to the region --betamethasone 0.05% BID --Atarax 25mg  qhs to alleviate pruritus and assist with sleep

## 2016-04-16 NOTE — Progress Notes (Signed)
[name], female   DOB: 14-Dec-1950, 66 y.o.   MRN: [id number]  I saw and evaluated the patient. I personally confirmed the key portions of Dr. Winfred Burn history and exam and reviewed pertinent patient test results. The assessment, diagnosis, and plan were formulated together and I agree with the documentation in the resident's note.

## 2016-04-19 ENCOUNTER — Telehealth: Payer: Self-pay | Admitting: Dietician

## 2016-04-19 NOTE — Telephone Encounter (Signed)
Chelsea [name] is a 66 y.o. female who was contacted on behalf of [facility] Geriatrics Task Force.  First try: Unable to reach by phone, unable to leave a message

## 2016-04-25 ENCOUNTER — Other Ambulatory Visit: Payer: Self-pay | Admitting: Internal Medicine

## 2016-05-04 ENCOUNTER — Telehealth: Payer: Self-pay

## 2016-05-04 NOTE — Telephone Encounter (Signed)
[place] - wants to know if Hydroxyzine can be changed to Zyrtec if prescribed for allergies or to Trazodone if it was prescribed for something else? Recommended per insurance. Please send new rx  Thanks

## 2016-05-04 NOTE — Telephone Encounter (Signed)
[facility] with [facility] nurse about med. Please call back.

## 2016-05-10 ENCOUNTER — Telehealth: Payer: Self-pay | Admitting: *Deleted

## 2016-05-10 NOTE — Telephone Encounter (Signed)
Fax from [facility] - states pt would like to try a substitute for Hydroxyzine 25 mg ; states it is not helping with itching at night. Wants to know if u can sent another rx for generic Allegra or Claritin? Thanks

## 2016-05-14 ENCOUNTER — Other Ambulatory Visit: Payer: Self-pay | Admitting: Internal Medicine

## 2016-05-14 DIAGNOSIS — I8310 Varicose veins of unspecified lower extremity with inflammation: Secondary | ICD-10-CM

## 2016-05-14 MED ORDER — LORATADINE 10 MG PO TABS: 10.0000 mg | ORAL_TABLET | Freq: Every day | ORAL | 2 refills | Status: DC

## 2016-05-14 NOTE — Telephone Encounter (Deleted)
Please see attached comment on rx

## 2016-05-14 NOTE — Telephone Encounter (Signed)
Has not been addressed yet. I will re-send to Dr Posey Pronto.

## 2016-05-14 NOTE — Telephone Encounter (Signed)
Will send in generic Claritin (Loratadine 10 mg daily) and discontinue Hydroxyzine.

## 2016-05-14 NOTE — Telephone Encounter (Signed)
This note is from 4 days ago. Did the patient get her Allegra/claritin? Also Dr. Posey Pronto is on inpatient and should be able to address this

## 2016-05-14 NOTE — Telephone Encounter (Signed)
Confirmed with pharmacy-pt has 2 refills left on rx.  Phone call complete.Despina Hidden Cassady4/2/20181:21 PM

## 2016-05-16 ENCOUNTER — Encounter (INDEPENDENT_AMBULATORY_CARE_PROVIDER_SITE_OTHER): Payer: Self-pay

## 2016-05-16 ENCOUNTER — Ambulatory Visit (INDEPENDENT_AMBULATORY_CARE_PROVIDER_SITE_OTHER): Payer: Medicare Other | Admitting: Internal Medicine

## 2016-05-16 ENCOUNTER — Encounter: Payer: Self-pay | Admitting: Internal Medicine

## 2016-05-16 VITALS — BP 154/61 | HR 67 | Temp 98.7°F | Wt 259.8 lb

## 2016-05-16 DIAGNOSIS — L298 Other pruritus: Secondary | ICD-10-CM

## 2016-05-16 DIAGNOSIS — Z794 Long term (current) use of insulin: Secondary | ICD-10-CM

## 2016-05-16 DIAGNOSIS — L853 Xerosis cutis: Secondary | ICD-10-CM | POA: Diagnosis not present

## 2016-05-16 DIAGNOSIS — I8310 Varicose veins of unspecified lower extremity with inflammation: Secondary | ICD-10-CM

## 2016-05-16 DIAGNOSIS — E11319 Type 2 diabetes mellitus with unspecified diabetic retinopathy without macular edema: Secondary | ICD-10-CM | POA: Diagnosis not present

## 2016-05-16 DIAGNOSIS — Z79899 Other long term (current) drug therapy: Secondary | ICD-10-CM

## 2016-05-16 DIAGNOSIS — I10 Essential (primary) hypertension: Secondary | ICD-10-CM | POA: Diagnosis not present

## 2016-05-16 DIAGNOSIS — L818 Other specified disorders of pigmentation: Secondary | ICD-10-CM

## 2016-05-16 DIAGNOSIS — Z0000 Encounter for general adult medical examination without abnormal findings: Secondary | ICD-10-CM

## 2016-05-16 DIAGNOSIS — E113593 Type 2 diabetes mellitus with proliferative diabetic retinopathy without macular edema, bilateral: Secondary | ICD-10-CM

## 2016-05-16 LAB — GLUCOSE, CAPILLARY: Glucose-Capillary: 80 mg/dL (ref 65–99)

## 2016-05-16 LAB — POCT GLYCOSYLATED HEMOGLOBIN (HGB A1C): Hemoglobin A1C: 6

## 2016-05-16 MED ORDER — CLONIDINE HCL 0.1 MG/24HR TD PTWK: 0.1000 mg | MEDICATED_PATCH | TRANSDERMAL | 5 refills | Status: DC

## 2016-05-16 MED ORDER — BETAMETHASONE DIPROPIONATE 0.05 % EX CREA: TOPICAL_CREAM | Freq: Two times a day (BID) | CUTANEOUS | 0 refills | Status: DC

## 2016-05-16 MED ORDER — INSULIN ASPART PROT & ASPART (70-30 MIX) 100 UNIT/ML PEN: PEN_INJECTOR | SUBCUTANEOUS | 11 refills | Status: DC

## 2016-05-16 NOTE — Patient Instructions (Addendum)
It was a pleasure to see you again Chelsea [name].  Your Hgb A1c was 6.0 today. We will continue your morning Novolog 70/30 at 30 units and decrease your nighttime Novolog 70/30 to 22 units.  Your Blood Pressure was a little high today. We will try a Clonidine patch to place on the skin once a week to help with this. Change the patch every week.  I will send a referral to a skin specialist (Dermatologist) for your legs.  Please follow up in 4 weeks to recheck your blood pressure and for your diabetes.  Clonidine skin patches What is this medicine? CLONIDINE (KLOE ni deen) is used to treat high blood pressure. This medicine may be used for other purposes; ask your health care provider or pharmacist if you have questions. COMMON BRAND NAME(S): Catapres-TTS What should I tell my health care provider before I take this medicine? They need to know if you have any of these conditions: -kidney disease -an unusual or allergic reaction to clonidine, other medicines, foods, dyes, or preservatives -pregnant or trying to get pregnant -breast-feeding How should I use this medicine? This medicine is for external use only. Follow the directions on the prescription label. Apply the patch to an area of the upper arm or part of the body that is clean, dry and hairless. Avoid injured, irritated, calloused, or scarred areas. Use a different site each time to prevent skin irritation. Do not cut or trim the patch. One patch should last for 7 days. Do not use your medicine more often than directed. Do not stop using except on the advice of your doctor or health care professional. You must gradually reduce the dose or you may get a dangerous increase in blood pressure. Talk to your pediatrician regarding the use of this medicine in children. Special care may be needed. Overdosage: If you think you have taken too much of this medicine contact a poison control center or emergency room at once. NOTE: This medicine is  only for you. Do not share this medicine with others. What if I miss a dose? Replace each patch on the same day of each week, or if the patch falls off. If you do forget to change the patch for two or three days, check with your doctor or health care professional. What may interact with this medicine? Do not take this medicine with any of the following medications: -MAOIs like Carbex, Eldepryl, Marplan, Nardil, and Parnate This medicine may also interact with the following medications: -barbiturate medicines for inducing sleep or treating seizures like phenobarbital -certain medicines for blood pressure, heart disease, irregular heart beat -certain medicines for depression, anxiety, or psychotic disturbances -prescription pain medicines This list may not describe all possible interactions. Give your health care provider a list of all the medicines, herbs, non-prescription drugs, or dietary supplements you use. Also tell them if you smoke, drink alcohol, or use illegal drugs. Some items may interact with your medicine. What should I watch for while using this medicine? Visit your doctor or health care professional for regular checks on your progress. Check your heart rate and blood pressure regularly while you are using this medicine. Ask your doctor or health care professional what your heart rate should be and when you should contact him or her. You can shower or bathe with the skin patch in position. If the patch gets loose, cover it with the extra adhesive overlay provided. You may get drowsy or dizzy. Do not drive, use machinery, or do  anything that needs mental alertness until you know how this medicine affects you. To avoid dizzy or fainting spells, do not stand or sit up quickly, especially if you are an older person. Alcohol can make you more drowsy and dizzy. Avoid alcoholic drinks. Your mouth may get dry. Chewing sugarless gum or sucking hard candy, and drinking plenty of water will  help. Do not treat yourself for coughs, colds, or pain while you are using this medicine without asking your doctor or health care professional for advice. Some ingredients may increase your blood pressure. If you are going to have surgery tell your doctor or health care professional that you are using this medicine. If you are going to have a magnetic resonance imaging (MRI) procedure, tell your MRI technician if you have this patch on your body. It must be removed before a MRI. What side effects may I notice from receiving this medicine? Side effects that you should report to your doctor or health care professional as soon as possible: -allergic reactions like skin rash, itching or hives, swelling of the face, lips, or tongue -anxiety, nervousness -chest pain -depression -fast, irregular heartbeat -swelling of feet or legs -unusually weak or tired Side effects that usually do not require medical attention (report to your doctor or health care professional if they continue or are bothersome): -change in sex drive or performance -constipation -headache -skin redness, irritation, or darkening under the patch area This list may not describe all possible side effects. Call your doctor for medical advice about side effects. You may report side effects to FDA at 1-800-FDA-1088. Where should I keep my medicine? Keep out of the reach of children. Store at room temperature between 15 and 30 degrees C (59 to 86 degrees F). Throw away any unused medicine after the expiration date. NOTE: This sheet is a summary. It may not cover all possible information. If you have questions about this medicine, talk to your doctor, pharmacist, or health care provider.  2018 Elsevier/Gold Standard (2014-10-11 09:32:67)
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
## 2016-05-17 ENCOUNTER — Encounter: Payer: Self-pay | Admitting: Dietician

## 2016-05-17 NOTE — Telephone Encounter (Signed)
Chelsea [name] is a 66 y.o. female who was contacted on behalf of [facility] Geriatrics Task Force.    Note patient in for an appointment yesterday. A1C was 6%. Congratulated her! She is very pleased.   Diabetes Assessment  DM meds and BS checks -  "What medications are you taking for diabetes?" Insulin -  "How often are you checking your blood sugars at home?" 2-3 times - "What have your blood sugars been?" am- 88/99,/134, pm-90-130   High Blood Pressure Assessment  BP meds & BP checks -  "What medications are you taking for high blood pressure?" atenolol, losartan, ? , added patch yesterday that has not been delivered yet, but she'll start when it does - "How often are you checking your blood pressure at home?" no, but plans to start - "What have your blood pressure readings been?" N/A  Coping with DM and BP - What else are you doing to help yourself with your DM and BP - Diet? Smaller portions less junk food - Exercise? Legs hurt  Medication Access Issues  Medication Issues? -  "Are you getting your medicines refilled on time without skipping any doses?" yes - "Are you having any problems getting/taking your medicines? - cost, timing, transportation?" no   Conclusion  Close the call - Confirmed importance and date/time of follow-up visits scheduled- 4 weeks - "Any other questions or concerns?" No - "Thanks for speaking with me today"   P[place], [name], [place] 05/17/2016 3:06 PM.

## 2016-05-20 NOTE — Assessment & Plan Note (Signed)
Patient declined the PNA vaccine and Hep C screening this visit.

## 2016-05-20 NOTE — Assessment & Plan Note (Signed)
Patient with exam findings consistent with lipodermatosclerosis. Her legs look significantly worse than the last time I saw her in January, however she feels there may be slight improvement since seen in March with use of Betamethasone cream. She tells me that she has also been using another OTC steroid cream, which I advised her to discontinue. Given her apparently worsened skin changes, I will refer her to Dermatology for further evaluation and management. Until then, will continue Betamethasone. - Dermatology referral - Betamethasone 0.05% cream BID - Discontinue Atarax

## 2016-05-20 NOTE — Assessment & Plan Note (Signed)
Repeat BP this visit is 154/61. BP continues to be elevated. Patient is resistant to add oral medications, which he really do not have many options for. Spironolactone can be considered, however her potassium has been borderline at 5.0. She has agreed to try a Clonidine patch for BP control. - Continue Amlodipine 10 mg daily - Continue Losartan-HCTZ 100-25 mg daily - Continue Atenolol 100 mg daily - Start Clonidine 0.1 mg patch weekly - Patient advised to monitor BP at home

## 2016-05-20 NOTE — Assessment & Plan Note (Signed)
Repeat Hgb A1c this visit is 6.0, down from 7.9. Her A1c has come down significantly, however I am concerned about intermittent hypoglycemic episodes with masked symptoms as she is on a beta blocker. I will decrease her PM insulin dose and advise her to eat consistent meals with regards to frequency and portion size. - Continue AM Novolog 70/30 at 30 units - Decrease PM Novolog 70/30 from 25 to 22 units - Continue Metformin XR 500 mg BID - Patient follows with [facility] - will try to obtain records

## 2016-05-22 NOTE — Progress Notes (Signed)
Internal Medicine Clinic Attending  Case discussed with Dr. Patel,Vishal at the time of the visit.  We reviewed the resident's history and exam and pertinent patient test results.  I agree with the assessment, diagnosis, and plan of care documented in the resident's note.  

## 2016-06-13 ENCOUNTER — Encounter: Payer: Self-pay | Admitting: Internal Medicine

## 2016-06-13 ENCOUNTER — Ambulatory Visit (INDEPENDENT_AMBULATORY_CARE_PROVIDER_SITE_OTHER): Payer: Medicare Other | Admitting: Internal Medicine

## 2016-06-13 VITALS — BP 159/57 | HR 66 | Temp 98.3°F | Ht 63.0 in | Wt 263.8 lb

## 2016-06-13 DIAGNOSIS — E113593 Type 2 diabetes mellitus with proliferative diabetic retinopathy without macular edema, bilateral: Secondary | ICD-10-CM

## 2016-06-13 DIAGNOSIS — Z794 Long term (current) use of insulin: Secondary | ICD-10-CM | POA: Diagnosis not present

## 2016-06-13 DIAGNOSIS — B9689 Other specified bacterial agents as the cause of diseases classified elsewhere: Secondary | ICD-10-CM | POA: Diagnosis not present

## 2016-06-13 DIAGNOSIS — Z9181 History of falling: Secondary | ICD-10-CM

## 2016-06-13 DIAGNOSIS — I10 Essential (primary) hypertension: Secondary | ICD-10-CM | POA: Diagnosis not present

## 2016-06-13 DIAGNOSIS — W19XXXA Unspecified fall, initial encounter: Secondary | ICD-10-CM | POA: Insufficient documentation

## 2016-06-13 DIAGNOSIS — Z79899 Other long term (current) drug therapy: Secondary | ICD-10-CM

## 2016-06-13 DIAGNOSIS — I8311 Varicose veins of right lower extremity with inflammation: Secondary | ICD-10-CM

## 2016-06-13 DIAGNOSIS — E11319 Type 2 diabetes mellitus with unspecified diabetic retinopathy without macular edema: Secondary | ICD-10-CM | POA: Diagnosis not present

## 2016-06-13 DIAGNOSIS — M1711 Unilateral primary osteoarthritis, right knee: Secondary | ICD-10-CM | POA: Diagnosis not present

## 2016-06-13 DIAGNOSIS — I8312 Varicose veins of left lower extremity with inflammation: Secondary | ICD-10-CM

## 2016-06-13 DIAGNOSIS — I8310 Varicose veins of unspecified lower extremity with inflammation: Secondary | ICD-10-CM

## 2016-06-13 MED ORDER — ALBUTEROL SULFATE HFA 108 (90 BASE) MCG/ACT IN AERS: INHALATION_SPRAY | RESPIRATORY_TRACT | 3 refills | Status: DC

## 2016-06-13 NOTE — Assessment & Plan Note (Signed)
Her blood sugars appear to be well controlled based on her meter readings. She has two borderline low readings at 77 and 83 and only one reading above 200 at 209. We will continue her current medications. - Continue AM Novolog 70/30 at 30 units - Continue PM Novolog 70/30 at 22 units - Continue Metformin XR 500 mg BID - Patient follows with [facility] - next eye exam due 10/2016 - f/u in 2 months for Hgb A1c recheck

## 2016-06-13 NOTE — Assessment & Plan Note (Signed)
Patient reports a fall about 3 weeks ago. She denies any symptoms to suggest cardiogenic cause. She denies any seizure-like activity. She was seen by EMS after her fall and reportedly cleared. I suspect this was a mechanical fall in the setting of not using her assistive device. She understands the importance of using her cane and will try to be more careful going forward. I have also filled out a handicap placard form to decrease her travel distance when around town.

## 2016-06-13 NOTE — Assessment & Plan Note (Signed)
Patient's blood pressure continues to be elevated as she has not used the Clonidine patch started last month. She is willing to use the Clonidine patch and has placed this on her right upper arm in clinic today. She will change it out weekly and has a 1 month supply on hand with refills ordered. - Continue Amlodipine 10 mg daily - Continue Losartan-HCTZ 100-25 mg daily - Continue Atenolol 100 mg daily - Start Clonidine 0.1 mg patch weekly

## 2016-06-13 NOTE — Assessment & Plan Note (Signed)
Patient' lipodermatosclerosis appears stable from last exam. She is awaiting scheduling of her dermatology appointment. I have advised her to hold off on further steroid creams for now and continue to use vaseline.  - Follow up with Dermatology

## 2016-06-13 NOTE — Patient Instructions (Addendum)
It was a pleasure to see you again Chelsea [name].  Please start using the Clonidine patch for your blood pressure. You can use this with your other blood pressure medications.  Please continue your Insulin and Metformin as prescribed. Your blood sugars look good.  We will get records from your last eye appointment at [facility]. Your next appointment will be due in September 2018.  We are working on your Dermatology referral.  I have filled out a handicap placard form.  Please follow up with me in 2 months or sooner if needed.   Clonidine skin patches What is this medicine? CLONIDINE (KLOE ni deen) is used to treat high blood pressure. This medicine may be used for other purposes; ask your health care provider or pharmacist if you have questions. COMMON BRAND NAME(S): Catapres-TTS What should I tell my health care provider before I take this medicine? They need to know if you have any of these conditions: -kidney disease -an unusual or allergic reaction to clonidine, other medicines, foods, dyes, or preservatives -pregnant or trying to get pregnant -breast-feeding How should I use this medicine? This medicine is for external use only. Follow the directions on the prescription label. Apply the patch to an area of the upper arm or part of the body that is clean, dry and hairless. Avoid injured, irritated, calloused, or scarred areas. Use a different site each time to prevent skin irritation. Do not cut or trim the patch. One patch should last for 7 days. Do not use your medicine more often than directed. Do not stop using except on the advice of your doctor or health care professional. You must gradually reduce the dose or you may get a dangerous increase in blood pressure. Talk to your pediatrician regarding the use of this medicine in children. Special care may be needed. Overdosage: If you think you have taken too much of this medicine contact a poison control center or emergency room  at once. NOTE: This medicine is only for you. Do not share this medicine with others. What if I miss a dose? Replace each patch on the same day of each week, or if the patch falls off. If you do forget to change the patch for two or three days, check with your doctor or health care professional. What may interact with this medicine? Do not take this medicine with any of the following medications: -MAOIs like Carbex, Eldepryl, Marplan, Nardil, and Parnate This medicine may also interact with the following medications: -barbiturate medicines for inducing sleep or treating seizures like phenobarbital -certain medicines for blood pressure, heart disease, irregular heart beat -certain medicines for depression, anxiety, or psychotic disturbances -prescription pain medicines This list may not describe all possible interactions. Give your health care provider a list of all the medicines, herbs, non-prescription drugs, or dietary supplements you use. Also tell them if you smoke, drink alcohol, or use illegal drugs. Some items may interact with your medicine. What should I watch for while using this medicine? Visit your doctor or health care professional for regular checks on your progress. Check your heart rate and blood pressure regularly while you are using this medicine. Ask your doctor or health care professional what your heart rate should be and when you should contact him or her. You can shower or bathe with the skin patch in position. If the patch gets loose, cover it with the extra adhesive overlay provided. You may get drowsy or dizzy. Do not drive, use machinery, or  do anything that needs mental alertness until you know how this medicine affects you. To avoid dizzy or fainting spells, do not stand or sit up quickly, especially if you are an older person. Alcohol can make you more drowsy and dizzy. Avoid alcoholic drinks. Your mouth may get dry. Chewing sugarless gum or sucking hard candy, and  drinking plenty of water will help. Do not treat yourself for coughs, colds, or pain while you are using this medicine without asking your doctor or health care professional for advice. Some ingredients may increase your blood pressure. If you are going to have surgery tell your doctor or health care professional that you are using this medicine. If you are going to have a magnetic resonance imaging (MRI) procedure, tell your MRI technician if you have this patch on your body. It must be removed before a MRI. What side effects may I notice from receiving this medicine? Side effects that you should report to your doctor or health care professional as soon as possible: -allergic reactions like skin rash, itching or hives, swelling of the face, lips, or tongue -anxiety, nervousness -chest pain -depression -fast, irregular heartbeat -swelling of feet or legs -unusually weak or tired Side effects that usually do not require medical attention (report to your doctor or health care professional if they continue or are bothersome): -change in sex drive or performance -constipation -headache -skin redness, irritation, or darkening under the patch area This list may not describe all possible side effects. Call your doctor for medical advice about side effects. You may report side effects to FDA at 1-800-FDA-1088. Where should I keep my medicine? Keep out of the reach of children. Store at room temperature between 15 and 30 degrees C (59 to 86 degrees F). Throw away any unused medicine after the expiration date. NOTE: This sheet is a summary. It may not cover all possible information. If you have questions about this medicine, talk to your doctor, pharmacist, or health care provider.  2018 Elsevier/Gold Standard (2014-10-11 15:37:94)

## 2016-06-13 NOTE — Progress Notes (Signed)
Medicine attending: Medical history, presenting problems, physical findings, and medications, reviewed with resident physician Dr Vishal Patel on the day of the patient visit and I concur with his evaluation and management plan. 
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
## 2016-06-15 ENCOUNTER — Other Ambulatory Visit: Payer: Self-pay | Admitting: Internal Medicine

## 2016-06-25 ENCOUNTER — Telehealth: Payer: Self-pay | Admitting: *Deleted

## 2016-06-25 NOTE — Telephone Encounter (Addendum)
Information was sent through CoverMyMeds for [place] consideration for Albuterol Sulfate Neb Solution.  Sander Nephew, RN 5/14 /2018 11:42 AM  Denial for Albuterol Solution was faxed from [facility].  Call to [place] to see if the prescription could be filed under patient's Part B Medicare.  They are unable to do.  Call to [facility] spoke with Pharmacy.  [facility] can fill the prescription if it is sent to them with a Diagnosis code and directions  for use on the prescription.  Message to be sent to Dr. Sherrye Payor to redo the prescription and sent it to [facility].  Sander Nephew, RN 06/27/2016 9:25 AM

## 2016-06-27 MED ORDER — ALBUTEROL SULFATE (2.5 MG/3ML) 0.083% IN NEBU: 2.5000 mg | INHALATION_SOLUTION | RESPIRATORY_TRACT | 1 refills | Status: DC | PRN

## 2016-06-27 NOTE — Telephone Encounter (Signed)
Dr. Sherrye Payor is on vacation through next week.  I reordered the albuterol and added a dx code.  Please let me know if anything else is needed.

## 2016-07-18 DIAGNOSIS — R600 Localized edema: Secondary | ICD-10-CM | POA: Diagnosis not present

## 2016-07-18 DIAGNOSIS — L299 Pruritus, unspecified: Secondary | ICD-10-CM | POA: Diagnosis not present

## 2016-07-18 DIAGNOSIS — L819 Disorder of pigmentation, unspecified: Secondary | ICD-10-CM | POA: Diagnosis not present

## 2016-07-26 DIAGNOSIS — R600 Localized edema: Secondary | ICD-10-CM | POA: Diagnosis not present

## 2016-08-07 ENCOUNTER — Telehealth: Payer: Self-pay | Admitting: Dietician

## 2016-08-07 ENCOUNTER — Encounter: Payer: Self-pay | Admitting: Dietician

## 2016-08-07 NOTE — Telephone Encounter (Signed)
Chelsea [name] is a 66 y.o. female who was contacted on behalf of [facility] Geriatrics Task Force.  A1C was 6% last visit- this is at goal. Blood pressure159/57 Jun 13, 2016. It is not at goal.  Diabetes Assessment  DM meds and BS checks -  "What medications are you taking for diabetes?" 70/30 insulin 30units qam and 22 units qpM,  Metformin, two times a day -  "How often do you check your blood sugars at home?" twice a day - "What have your blood sugars been?" 120/140/214  High Blood Pressure Assessment  BP meds & BP checks -  "What medications are you taking for high blood pressure?" patch on- clonidine, amlodipine, hyzaar - "How often do you check your blood pressure at home?" machine down, does not check at home - "What have your blood pressure readings been?" [facility] at [facility]- pretty good"- doesn't know numbers  Coping with DM and BP - What else are you doing to help with your DM and BP - Diet? Good- I eats good, loves vegetables, not overloading, hardly any sweets  -  Exercise? Cannot walk because of her legs  Medication Access Issues  Medication Issues? -  "Are you getting your medicines refilled on time without skipping any doses?" no skipping - "Are you having any problems getting/taking your meds (cost, timing, transportation)?" no- they are delviered - Do you need any meds refilled? no  Conclusion  Close the call - Date of follow-up visit scheduled-08/22/2016 1:15 PM, patient aware - "Any other questions or concerns?" dermatologist results pending about her legs  Per EPIC, patient declined advance directives on 5.2.2018.  Renesmae Donahey, [name], [place] 08/07/2016 3:06 PM.

## 2016-08-08 ENCOUNTER — Encounter: Payer: Self-pay | Admitting: Podiatry

## 2016-08-08 ENCOUNTER — Ambulatory Visit (INDEPENDENT_AMBULATORY_CARE_PROVIDER_SITE_OTHER): Payer: Medicare Other | Admitting: Podiatry

## 2016-08-08 DIAGNOSIS — B351 Tinea unguium: Secondary | ICD-10-CM

## 2016-08-08 DIAGNOSIS — M79676 Pain in unspecified toe(s): Secondary | ICD-10-CM | POA: Diagnosis not present

## 2016-08-08 NOTE — Progress Notes (Signed)
[name], female   DOB: September 14, 1950, 66 y.o.   MRN: [id number] Complaint:  Visit Type: Patient returns to my office for continued preventative foot care services. Complaint: Patient states" my nails have grown long and thick and become painful to walk and wear shoes" Patient has been diagnosed with DM with neuropathy He presents for preventative foot care services. No changes to ROS.  Painful callus under the balls of both feet.  Podiatric Exam: Vascular: dorsalis pedis and posterior tibial pulses are palpable bilateral. Capillary return is immediate. Temperature gradient is WNL. Skin turgor WNL  Sensorium: Diminished Semmes Weinstein monofilament test. Normal tactile sensation bilaterally. Nail Exam: Pt has thick disfigured discolored nails with subungual debris noted bilateral entire nail hallux through fifth toenails Ulcer Exam: There is no evidence of ulcer or pre-ulcerative changes or infection. Orthopedic Exam: Muscle tone and strength are WNL. No limitations in general ROM. No crepitus or effusions noted. Foot type and digits show no abnormalities. DJD 1st MPJ B/L. Skin: No Porokeratosis. No infection or ulcers.    Diagnosis:  Tinea unguium, Pain in right toe, pain in left toes  Treatment & Plan Procedures and Treatment: Consent by patient was obtained for treatment procedures. The patient understood the discussion of treatment and procedures well. All questions were answered thoroughly reviewed. Debridement of mycotic and hypertrophic toenails, 1 through 5 bilateral and clearing of subungual debris. No ulceration, no infection noted.  .Return Visit-Office Procedure: Patient instructed to return to the office for a follow up visit 3 months for continued evaluation and treatment.   Gardiner Barefoot DPM

## 2016-08-17 ENCOUNTER — Other Ambulatory Visit: Payer: Self-pay | Admitting: Dietician

## 2016-08-17 ENCOUNTER — Encounter: Payer: Self-pay | Admitting: Dietician

## 2016-08-17 DIAGNOSIS — Z794 Long term (current) use of insulin: Principal | ICD-10-CM

## 2016-08-17 DIAGNOSIS — E113593 Type 2 diabetes mellitus with proliferative diabetic retinopathy without macular edema, bilateral: Secondary | ICD-10-CM

## 2016-08-17 MED ORDER — GLUCOSE BLOOD VI STRP: ORAL_STRIP | 5 refills | Status: DC

## 2016-08-17 MED ORDER — ACCU-CHEK SOFTCLIX LANCETS MISC: 12 refills | Status: DC

## 2016-08-17 NOTE — Telephone Encounter (Signed)
Jeslin requests testing supply prescriptions sent to [facility] on [address]

## 2016-08-22 ENCOUNTER — Other Ambulatory Visit: Payer: Self-pay | Admitting: *Deleted

## 2016-08-22 ENCOUNTER — Encounter: Payer: Self-pay | Admitting: Internal Medicine

## 2016-08-22 ENCOUNTER — Ambulatory Visit (INDEPENDENT_AMBULATORY_CARE_PROVIDER_SITE_OTHER): Payer: Medicare Other | Admitting: Internal Medicine

## 2016-08-22 ENCOUNTER — Telehealth: Payer: Self-pay | Admitting: Dietician

## 2016-08-22 VITALS — BP 155/54 | HR 60 | Temp 97.7°F | Wt 271.4 lb

## 2016-08-22 DIAGNOSIS — Z794 Long term (current) use of insulin: Secondary | ICD-10-CM | POA: Diagnosis not present

## 2016-08-22 DIAGNOSIS — M1990 Unspecified osteoarthritis, unspecified site: Secondary | ICD-10-CM

## 2016-08-22 DIAGNOSIS — Z79899 Other long term (current) drug therapy: Secondary | ICD-10-CM | POA: Diagnosis not present

## 2016-08-22 DIAGNOSIS — E11319 Type 2 diabetes mellitus with unspecified diabetic retinopathy without macular edema: Secondary | ICD-10-CM

## 2016-08-22 DIAGNOSIS — I152 Hypertension secondary to endocrine disorders: Secondary | ICD-10-CM | POA: Diagnosis not present

## 2016-08-22 DIAGNOSIS — E113593 Type 2 diabetes mellitus with proliferative diabetic retinopathy without macular edema, bilateral: Secondary | ICD-10-CM

## 2016-08-22 DIAGNOSIS — F329 Major depressive disorder, single episode, unspecified: Secondary | ICD-10-CM | POA: Diagnosis not present

## 2016-08-22 DIAGNOSIS — I10 Essential (primary) hypertension: Secondary | ICD-10-CM

## 2016-08-22 DIAGNOSIS — J45909 Unspecified asthma, uncomplicated: Secondary | ICD-10-CM

## 2016-08-22 DIAGNOSIS — E1159 Type 2 diabetes mellitus with other circulatory complications: Secondary | ICD-10-CM

## 2016-08-22 DIAGNOSIS — F32A Depression, unspecified: Secondary | ICD-10-CM

## 2016-08-22 LAB — POCT GLYCOSYLATED HEMOGLOBIN (HGB A1C): Hemoglobin A1C: 6.3

## 2016-08-22 LAB — GLUCOSE, CAPILLARY: Glucose-Capillary: 128 mg/dL — ABNORMAL HIGH (ref 65–99)

## 2016-08-22 MED ORDER — INSULIN ASPART PROT & ASPART (70-30 MIX) 100 UNIT/ML PEN: PEN_INJECTOR | SUBCUTANEOUS | 11 refills | Status: DC

## 2016-08-22 MED ORDER — SERTRALINE HCL 25 MG PO TABS: 25.0000 mg | ORAL_TABLET | Freq: Every day | ORAL | 2 refills | Status: DC

## 2016-08-22 MED ORDER — ACCU-CHEK SOFTCLIX LANCETS MISC: 12 refills | Status: DC

## 2016-08-22 NOTE — Patient Instructions (Addendum)
It was a pleasure to see you again Ms. Scheidt.  Your A1c today is 6.3. You are doing a good job with your diabetes!  Please decrease your morning Novolog 70/30 to 27 units in the morning.  Continue Novolog 70/30 at 22 units in the evening.  We will try a medication called Sertraline for your depression. This is a once a day pill you can take at night.  Please continue your other medications as prescribed.  Follow up with [place] in 4 weeks.   Sertraline tablets What is this medicine? SERTRALINE (SER tra leen) is used to treat depression. It may also be used to treat obsessive compulsive disorder, panic disorder, post-trauma stress, premenstrual dysphoric disorder (PMDD) or social anxiety. This medicine may be used for other purposes; ask your health care provider or pharmacist if you have questions. COMMON BRAND NAME(S): Zoloft What should I tell my health care provider before I take this medicine? They need to know if you have any of these conditions: -bleeding disorders -bipolar disorder or a family history of bipolar disorder -glaucoma -heart disease -high blood pressure -history of irregular heartbeat -history of low levels of calcium, magnesium, or potassium in the blood -if you often drink alcohol -liver disease -receiving electroconvulsive therapy -seizures -suicidal thoughts, plans, or attempt; a previous suicide attempt by you or a family member -take medicines that treat or prevent blood clots -thyroid disease -an unusual or allergic reaction to sertraline, other medicines, foods, dyes, or preservatives -pregnant or trying to get pregnant -breast-feeding How should I use this medicine? Take this medicine by mouth with a glass of water. Follow the directions on the prescription label. You can take it with or without food. Take your medicine at regular intervals. Do not take your medicine more often than directed. Do not stop taking this medicine suddenly except upon the  advice of your doctor. Stopping this medicine too quickly may cause serious side effects or your condition may worsen. A special MedGuide will be given to you by the pharmacist with each prescription and refill. Be sure to read this information carefully each time. Talk to your pediatrician regarding the use of this medicine in children. While this drug may be prescribed for children as young as 7 years for selected conditions, precautions do apply. Overdosage: If you think you have taken too much of this medicine contact a poison control center or emergency room at once. NOTE: This medicine is only for you. Do not share this medicine with others. What if I miss a dose? If you miss a dose, take it as soon as you can. If it is almost time for your next dose, take only that dose. Do not take double or extra doses. What may interact with this medicine? Do not take this medicine with any of the following medications: -cisapride -dofetilide -dronedarone -linezolid -MAOIs like Carbex, Eldepryl, Marplan, Nardil, and Parnate -methylene blue (injected into a vein) -pimozide -thioridazine This medicine may also interact with the following medications: -alcohol -amphetamines -aspirin and aspirin-like medicines -certain medicines for depression, anxiety, or psychotic disturbances -certain medicines for fungal infections like ketoconazole, fluconazole, posaconazole, and itraconazole -certain medicines for irregular heart beat like flecainide, quinidine, propafenone -certain medicines for migraine headaches like almotriptan, eletriptan, frovatriptan, naratriptan, rizatriptan, sumatriptan, zolmitriptan -certain medicines for sleep -certain medicines for seizures like carbamazepine, valproic acid, phenytoin -certain medicines that treat or prevent blood clots like warfarin, enoxaparin, dalteparin -cimetidine -digoxin -diuretics -fentanyl -isoniazid -lithium -NSAIDs, medicines for pain and  inflammation, like  ibuprofen or naproxen -other medicines that prolong the QT interval (cause an abnormal heart rhythm) -rasagiline -safinamide -supplements like St. John's wort, kava kava, valerian -tolbutamide -tramadol -tryptophan This list may not describe all possible interactions. Give your health care provider a list of all the medicines, herbs, non-prescription drugs, or dietary supplements you use. Also tell them if you smoke, drink alcohol, or use illegal drugs. Some items may interact with your medicine. What should I watch for while using this medicine? Tell your doctor if your symptoms do not get better or if they get worse. Visit your doctor or health care professional for regular checks on your progress. Because it may take several weeks to see the full effects of this medicine, it is important to continue your treatment as prescribed by your doctor. Patients and their families should watch out for new or worsening thoughts of suicide or depression. Also watch out for sudden changes in feelings such as feeling anxious, agitated, panicky, irritable, hostile, aggressive, impulsive, severely restless, overly excited and hyperactive, or not being able to sleep. If this happens, especially at the beginning of treatment or after a change in dose, call your health care professional. Dennis Bast may get drowsy or dizzy. Do not drive, use machinery, or do anything that needs mental alertness until you know how this medicine affects you. Do not stand or sit up quickly, especially if you are an older patient. This reduces the risk of dizzy or fainting spells. Alcohol may interfere with the effect of this medicine. Avoid alcoholic drinks. Your mouth may get dry. Chewing sugarless gum or sucking hard candy, and drinking plenty of water may help. Contact your doctor if the problem does not go away or is severe. What side effects may I notice from receiving this medicine? Side effects that you should report  to your doctor or health care professional as soon as possible: -allergic reactions like skin rash, itching or hives, swelling of the face, lips, or tongue -anxious -black, tarry stools -changes in vision -confusion -elevated mood, decreased need for sleep, racing thoughts, impulsive behavior -eye pain -fast, irregular heartbeat -feeling faint or lightheaded, falls -feeling agitated, angry, or irritable -hallucination, loss of contact with reality -loss of balance or coordination -loss of memory -painful or prolonged erections -restlessness, pacing, inability to keep still -seizures -stiff muscles -suicidal thoughts or other mood changes -trouble sleeping -unusual bleeding or bruising -unusually weak or tired -vomiting Side effects that usually do not require medical attention (report to your doctor or health care professional if they continue or are bothersome): -change in appetite or weight -change in sex drive or performance -diarrhea -increased sweating -indigestion, nausea -tremors This list may not describe all possible side effects. Call your doctor for medical advice about side effects. You may report side effects to FDA at 1-800-FDA-1088. Where should I keep my medicine? Keep out of the reach of children. Store at room temperature between 15 and 30 degrees C (59 and 86 degrees F). Throw away any unused medicine after the expiration date. NOTE: This sheet is a summary. It may not cover all possible information. If you have questions about this medicine, talk to your doctor, pharmacist, or health care provider.  2018 Elsevier/Gold Standard (2016-02-03 14:17:49)

## 2016-08-22 NOTE — Telephone Encounter (Signed)
Anaria says she cannot get her strips because it has the wrong code. Called [facility] and the pharmacist reran the prescriptions saying "they just don't know how to put it in". Her testing supplies went through for free.
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
## 2016-08-23 NOTE — Assessment & Plan Note (Signed)
BP Readings from Last 3 Encounters:  08/22/16 (!) 155/54  06/13/16 (!) 159/57  05/16/16 (!) 154/61   Patient's BP continues to remain elevated. She does report adherence to her current medications. She is reporting significant stress in her life and anxiety as well. She is already on max dosing of her oral medications. If her BP is persistently elevated, we will consider increasing her Clonidine patch dosing. Spironolactone is another option, however her potassium has been borderline on prevoius BMETs at 5.0-5.1. - Continue Amlodipine 10 mg daily - Continue Losartan-HCTZ 100-25 mg daily - Continue Atenolol 100 mg daily - Continue Clonidine 0.1 mg patch weekly - can increase if needed

## 2016-08-23 NOTE — Assessment & Plan Note (Signed)
Repeat Hgb A1c is 6.3. Her diabetes continues to be well-controlled. She is having borderline low blood sugars around noon. We will adjust her medications as follows: - Decrease AM Novolog 70/30 from 30 to 27 units - Continue PM Novolog 70/30 at 22 units - Continue Metformin XR 500 mg BID - Repeat A1c in 3 months - She will f/u with [facility] in 10/2016

## 2016-08-23 NOTE — Progress Notes (Signed)
Internal Medicine Clinic Attending  Case discussed with Dr. Patel at the time of the visit.  We reviewed the resident's history and exam and pertinent patient test results.  I agree with the assessment, diagnosis, and plan of care documented in the resident's note.  

## 2016-08-23 NOTE — Assessment & Plan Note (Signed)
Patient has worsening mood and depressive symptoms. Her PHQ-9 score is 8 indication mild depression. I do think she has some anxiety component as well. Unfortunately her group counseling team are no longer meeting due to financial burden. She is wanting to try medication at this time. - Start Sertraline 25 mg qhs - can titrate up if needed and if patient tolerating - F/u in 1 month

## 2016-08-24 ENCOUNTER — Other Ambulatory Visit: Payer: Self-pay | Admitting: *Deleted

## 2016-08-24 DIAGNOSIS — E113593 Type 2 diabetes mellitus with proliferative diabetic retinopathy without macular edema, bilateral: Secondary | ICD-10-CM

## 2016-08-24 DIAGNOSIS — Z794 Long term (current) use of insulin: Principal | ICD-10-CM

## 2016-08-24 MED ORDER — ACCU-CHEK SOFTCLIX LANCETS MISC: 12 refills | Status: DC

## 2016-08-27 ENCOUNTER — Other Ambulatory Visit: Payer: Self-pay | Admitting: Internal Medicine

## 2016-08-27 DIAGNOSIS — I10 Essential (primary) hypertension: Secondary | ICD-10-CM

## 2016-09-03 ENCOUNTER — Other Ambulatory Visit: Payer: Self-pay | Admitting: Internal Medicine

## 2016-09-14 ENCOUNTER — Other Ambulatory Visit: Payer: Self-pay | Admitting: Internal Medicine

## 2016-09-17 ENCOUNTER — Encounter: Payer: Self-pay | Admitting: *Deleted

## 2016-10-17 ENCOUNTER — Encounter: Payer: Medicare Other | Admitting: Internal Medicine

## 2016-10-30 ENCOUNTER — Other Ambulatory Visit: Payer: Self-pay | Admitting: Internal Medicine

## 2016-10-30 DIAGNOSIS — I10 Essential (primary) hypertension: Secondary | ICD-10-CM

## 2016-11-01 ENCOUNTER — Other Ambulatory Visit: Payer: Self-pay | Admitting: Internal Medicine

## 2016-11-01 DIAGNOSIS — Z1231 Encounter for screening mammogram for malignant neoplasm of breast: Secondary | ICD-10-CM

## 2016-11-14 ENCOUNTER — Ambulatory Visit (INDEPENDENT_AMBULATORY_CARE_PROVIDER_SITE_OTHER): Payer: Medicare Other | Admitting: Internal Medicine

## 2016-11-14 ENCOUNTER — Encounter: Payer: Self-pay | Admitting: Internal Medicine

## 2016-11-14 ENCOUNTER — Encounter (INDEPENDENT_AMBULATORY_CARE_PROVIDER_SITE_OTHER): Payer: Self-pay

## 2016-11-14 VITALS — BP 154/56 | HR 70 | Temp 98.1°F | Wt 265.5 lb

## 2016-11-14 DIAGNOSIS — M1711 Unilateral primary osteoarthritis, right knee: Secondary | ICD-10-CM | POA: Diagnosis not present

## 2016-11-14 DIAGNOSIS — Z794 Long term (current) use of insulin: Secondary | ICD-10-CM | POA: Diagnosis not present

## 2016-11-14 DIAGNOSIS — F329 Major depressive disorder, single episode, unspecified: Secondary | ICD-10-CM | POA: Diagnosis not present

## 2016-11-14 DIAGNOSIS — E1159 Type 2 diabetes mellitus with other circulatory complications: Secondary | ICD-10-CM | POA: Diagnosis not present

## 2016-11-14 DIAGNOSIS — I10 Essential (primary) hypertension: Secondary | ICD-10-CM

## 2016-11-14 DIAGNOSIS — I152 Hypertension secondary to endocrine disorders: Secondary | ICD-10-CM

## 2016-11-14 DIAGNOSIS — F32A Depression, unspecified: Secondary | ICD-10-CM

## 2016-11-14 DIAGNOSIS — E113593 Type 2 diabetes mellitus with proliferative diabetic retinopathy without macular edema, bilateral: Secondary | ICD-10-CM | POA: Diagnosis present

## 2016-11-14 DIAGNOSIS — Z0000 Encounter for general adult medical examination without abnormal findings: Secondary | ICD-10-CM | POA: Diagnosis not present

## 2016-11-14 LAB — GLUCOSE, CAPILLARY: Glucose-Capillary: 97 mg/dL (ref 65–99)

## 2016-11-14 LAB — POCT GLYCOSYLATED HEMOGLOBIN (HGB A1C): Hemoglobin A1C: 6.2

## 2016-11-14 MED ORDER — DULOXETINE HCL 30 MG PO CPEP: 30.0000 mg | ORAL_CAPSULE | Freq: Every day | ORAL | 3 refills | Status: DC

## 2016-11-14 MED ORDER — DICLOFENAC SODIUM 1 % TD GEL: 4.0000 g | Freq: Four times a day (QID) | TRANSDERMAL | 2 refills | Status: DC

## 2016-11-14 NOTE — Assessment & Plan Note (Signed)
Patient's chronic right knee pain from arthritis is interfering with her ability to safely ambulate especially when navigating stairs. Will try Voltaren gel for pain. She may continue as needed Tylenol. Will also refer for home health PT and OT. Letter to excuse from jury duty provided today. - Voltaren and Tylenol prn - [facility] PT/OT referral

## 2016-11-14 NOTE — Patient Instructions (Signed)
It was a pleasure to see you again Chelsea [name].  Your blood pressure was a little better after resting at 154/56. We can increase the dose on your Clonidine patch if you want to try to improve your blood pressure control.  Please limit salt to less than 2 grams per day.  Please elevate your legs when at rest and at night when sleeping. Keep your legs above the level of your heart. Try compression socks as well.  Your diabetes is under good control. Your A1c today is 6.2. Keep up the great work and continue your Novolog and Metformin as prescribed.  We will try a different medication for your depression called Duloxetine. We will start with a low dose and increase if needed and if you tolerate this well. This medicine can also help with your osteoarthritis pain.  I have sent a prescription for Voltaren gel to use for your joint pain.  I have sent a referral for home physical and occupational therapy.  I have written a letter for your jury duty.  Please follow up with me in 3 months or sooner if needed.
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
## 2016-11-14 NOTE — Assessment & Plan Note (Signed)
Declined flu shot today.

## 2016-11-14 NOTE — Assessment & Plan Note (Signed)
Patient's depression is not subjectively improved. Her PHQ-9 is 8 today compared to 8 on last visit indicating continued mild depression. We will try low starting dose of Duloxetine which may also help with her osteoarthritic pain. Can titrate up if tolerating and if needed. - Duloxetine 30 mg daily

## 2016-11-14 NOTE — Assessment & Plan Note (Signed)
A1c today is 6.2. Her diabetes is well-controlled. Will continue current management. - Continue Novolog 70/30 at 27 units AM and 22 units PM - Continue Metformin XR 500 mg BID - Repeat A1c in 3 months

## 2016-11-14 NOTE — Assessment & Plan Note (Signed)
BP Readings from Last 3 Encounters:  11/14/16 (!) 154/56  08/22/16 (!) 155/54  06/13/16 (!) 159/57   Repeat BP is 154/56, continues to remain elevated. She reports adherence to her medications. She is resistant to increasing or adding on medications at this time to improve her blood pressure control. We have room to go up on her HCTZ to 50 mg (will likely have to split Losartan and HCTZ combo pill) or her Clonidine patch if she is agreeable in the future. - Continue Amlodipine 10 mg daily - Continue Losartan-HCTZ 100-25 mg daily - Continue Atenolol 100 mg daily - Continue Clonidine 0.1 mg patch weekly - can increase if needed

## 2016-11-16 ENCOUNTER — Other Ambulatory Visit: Payer: Self-pay | Admitting: Internal Medicine

## 2016-11-19 ENCOUNTER — Telehealth: Payer: Self-pay

## 2016-11-19 NOTE — Progress Notes (Signed)
Internal Medicine Clinic Attending  Case discussed with Dr. Patel at the time of the visit.  We reviewed the resident's history and exam and pertinent patient test results.  I agree with the assessment, diagnosis, and plan of care documented in the resident's note.  

## 2016-11-19 NOTE — Telephone Encounter (Signed)
Nicolette with [facility] requesting VO. Please call pt back.

## 2016-11-20 ENCOUNTER — Ambulatory Visit
Admission: RE | Admit: 2016-11-20 | Discharge: 2016-11-20 | Disposition: A | Discharge status: Home / Self Care | Payer: Medicare Other | Source: Ambulatory Visit | Attending: Family Medicine | Admitting: Family Medicine

## 2016-11-20 DIAGNOSIS — Z1231 Encounter for screening mammogram for malignant neoplasm of breast: Secondary | ICD-10-CM | POA: Diagnosis not present

## 2016-11-20 NOTE — Telephone Encounter (Signed)
Agree with verbal order.  Thank you! 

## 2016-11-20 NOTE — Telephone Encounter (Signed)
VO for HH PT- 1xweek for 1 wk 2xweek for 3 wk 1xweek for 1 wk For gait, strengthening, safety Do you agree?

## 2016-12-05 ENCOUNTER — Telehealth: Payer: Self-pay

## 2016-12-05 NOTE — Telephone Encounter (Signed)
Faxed [facility] certification and plan for 11/16/2016 to 01/14/2017 @ [phone] on 12/05/2016.

## 2016-12-11 ENCOUNTER — Encounter: Payer: Self-pay | Admitting: Dietician

## 2016-12-11 ENCOUNTER — Telehealth: Payer: Self-pay | Admitting: Dietician

## 2016-12-11 ENCOUNTER — Other Ambulatory Visit: Payer: Self-pay | Admitting: Internal Medicine

## 2016-12-11 NOTE — Telephone Encounter (Signed)
Tried to call on behalf of the Geriatric task force. Unable to leave a message.  Chelsea Chelsea is a 66 y.o. female who was contacted on behalf of [facility] Geriatrics Task Force.   Diabetes Assessment  DM meds and BS checks -  "What medications are you taking for diabetes?" metformin 2 a day 500 mg              novolog mix 70/30 27 in am, 22 at night before meals -  "How often do you check your blood sugars at home?" 2-3 times a day - "What have your blood sugars been?" 105-110, 6.3% last month. Less stress, eat right, exercise program lady comes twice a week x2 week.   High Blood Pressure Assessment  BP meds & BP checks -  "What medications are you taking for high blood pressure?" atenolol, amlodipine, catapres, hyzaar - "How often do you check your blood pressure at home?" two times a week - "What have your blood pressure readings been?" in target- good, not sure what numbers are thinks it was 120/72  Coping with DM and BP - What else are you doing to help with your DM and BP - Diet? Less snacks, healthier snacks, portions Exercise? Walks, tries to move, doesn't sleep well bedtime 830 pm, thinks a lot, depressed most of the time, doesn't take medicine like she should. Liked Going to group, but it stopped meeting  Medication Access Issues  Medication Issues? -  "Are you getting your medicines refilled on time without skipping any doses?" none, has not skipped - "Are you having any problems getting/taking your meds (cost, timing, transportation)?" no problems - Do you need any meds refilled? No   Conclusion  Close the call - Date of follow-up visit scheduled 02/21/2016 - "Any other questions or concerns?" no except to wish her a happy birthday!    Agreed to have me mail information on groups for talk therapy/depression that she can call to get more information.  Kayan Blissett, [name], [place] 12/11/2016 3:48 PM.

## 2016-12-11 NOTE — Telephone Encounter (Signed)
Thank you :)

## 2016-12-14 ENCOUNTER — Telehealth: Payer: Self-pay

## 2016-12-14 NOTE — Telephone Encounter (Signed)
Agree, thank you

## 2016-12-14 NOTE — Telephone Encounter (Signed)
Chelsea [name] with [facility] requesting VO to extend home health PT. Please call back.

## 2016-12-14 NOTE — Telephone Encounter (Signed)
[facility] requesting to extend home health PT 2x/wk for 4 weeks. VO given, do you agree? Thanks!

## 2016-12-18 ENCOUNTER — Telehealth: Payer: Self-pay | Admitting: Internal Medicine

## 2016-12-18 DIAGNOSIS — R2681 Unsteadiness on feet: Secondary | ICD-10-CM

## 2016-12-18 NOTE — Telephone Encounter (Signed)
[facility] Nurse requesting a 4 Wheeled Rollator Hotel manager for this patient due to weakness and imbalance.  Please Advise.

## 2016-12-19 ENCOUNTER — Other Ambulatory Visit: Payer: Self-pay | Admitting: Internal Medicine

## 2016-12-19 NOTE — Telephone Encounter (Signed)
DME order for Rollator walker has been faxed to [facility] @ [phone].  If the patient has any questions please have her contact [facility] @ [phone].

## 2016-12-19 NOTE — Telephone Encounter (Signed)
Agree should would benefit from using a rollator walker. DME order placed. Please let me know if there are any issues. Thank you.

## 2017-01-10 ENCOUNTER — Other Ambulatory Visit: Payer: Self-pay | Admitting: Internal Medicine

## 2017-01-10 DIAGNOSIS — M1711 Unilateral primary osteoarthritis, right knee: Secondary | ICD-10-CM

## 2017-02-20 ENCOUNTER — Other Ambulatory Visit: Payer: Self-pay

## 2017-02-20 ENCOUNTER — Encounter: Payer: Self-pay | Admitting: Internal Medicine

## 2017-02-20 ENCOUNTER — Ambulatory Visit (INDEPENDENT_AMBULATORY_CARE_PROVIDER_SITE_OTHER): Payer: Medicare Other | Admitting: Internal Medicine

## 2017-02-20 VITALS — BP 189/60 | HR 65 | Temp 97.5°F | Ht 63.0 in | Wt 259.8 lb

## 2017-02-20 DIAGNOSIS — Z794 Long term (current) use of insulin: Secondary | ICD-10-CM

## 2017-02-20 DIAGNOSIS — J45909 Unspecified asthma, uncomplicated: Secondary | ICD-10-CM | POA: Diagnosis not present

## 2017-02-20 DIAGNOSIS — M1990 Unspecified osteoarthritis, unspecified site: Secondary | ICD-10-CM

## 2017-02-20 DIAGNOSIS — I152 Hypertension secondary to endocrine disorders: Secondary | ICD-10-CM

## 2017-02-20 DIAGNOSIS — E1159 Type 2 diabetes mellitus with other circulatory complications: Secondary | ICD-10-CM

## 2017-02-20 DIAGNOSIS — E11319 Type 2 diabetes mellitus with unspecified diabetic retinopathy without macular edema: Secondary | ICD-10-CM | POA: Diagnosis not present

## 2017-02-20 DIAGNOSIS — I10 Essential (primary) hypertension: Secondary | ICD-10-CM | POA: Diagnosis not present

## 2017-02-20 DIAGNOSIS — E11649 Type 2 diabetes mellitus with hypoglycemia without coma: Secondary | ICD-10-CM

## 2017-02-20 DIAGNOSIS — Z79899 Other long term (current) drug therapy: Secondary | ICD-10-CM

## 2017-02-20 DIAGNOSIS — E113593 Type 2 diabetes mellitus with proliferative diabetic retinopathy without macular edema, bilateral: Secondary | ICD-10-CM

## 2017-02-20 LAB — POCT GLYCOSYLATED HEMOGLOBIN (HGB A1C): HEMOGLOBIN A1C: 7.3

## 2017-02-20 LAB — GLUCOSE, CAPILLARY
GLUCOSE-CAPILLARY: 69 mg/dL (ref 65–99)
Glucose-Capillary: 84 mg/dL (ref 65–99)

## 2017-02-20 MED ORDER — CLONIDINE 0.2 MG/24HR TD PTWK: 0.2000 mg | MEDICATED_PATCH | TRANSDERMAL | 5 refills | Status: DC

## 2017-02-20 MED ORDER — INSULIN ASPART PROT & ASPART (70-30 MIX) 100 UNIT/ML PEN: PEN_INJECTOR | SUBCUTANEOUS | 11 refills | Status: DC

## 2017-02-20 MED ORDER — GLUCOSE BLOOD VI STRP: ORAL_STRIP | 5 refills | Status: DC
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
## 2017-02-20 NOTE — Progress Notes (Signed)
Hypoglycemic Event  CBG: 69  Treatment: Orange Juice  Symptoms: None   Follow-up CBG    Time: 2:12 pm   CBG Result: 84    Possible Reasons for Event: Had not eaten since this morning.  Comments/MD notified : Dr Posey Pronto aware    Donita Brooks, Trey Sailors

## 2017-02-20 NOTE — Patient Instructions (Signed)
It was a pleasure to see you again Chelsea [name].  Your Hemoglobin A1c was a little higher at 7.3 this visit.  Please take your Novolog 70/30 insulin as follows: - 27 units with breakfast - 25 units with supper/dinner  I have refilled your test strips and Clonidine patch.  Please continue your other medications as prescribed.  Please work on healthy eating patterns.  We are checking blood work to assess your kidney numbers.  Please follow up with me in about 3 months or see [place] sooner if needed.

## 2017-02-20 NOTE — Assessment & Plan Note (Addendum)
[name] has been taking Novolog 70/30 (27 units am and 22 units pm with meals) as well as Metformin XR 500 mg BID (has not tolerated higher doses due to GI issues) for her diabetes. She checks her CBG every morning before breakfast and has noticed higher readings in the last few weeks (~200s). She subsequently increased her AM insulin to 30 units. Her last A1c was 6.2. A/P: Her AM readings are elevated recently and her repeat A1c is 7.3. CBG check on arrival this visit was 69. She was asymptomatic and given orange juice. She had not eaten since breakfast. Repeat CBG was 84. Given her high AM readings and likely lows midday after breakfast, we will adjust her insulin as follows: - Novolog 70/30 decrease AM back to 27 units and increase PM to 25 units - Continue Metformin XR BID - Repeat A1c in 3 months - Needs repeat eye exam

## 2017-02-21 LAB — BMP8+ANION GAP
Anion Gap: 15 mmol/L (ref 10.0–18.0)
BUN / CREAT RATIO: 25 (ref 12–28)
BUN: 37 mg/dL — AB (ref 8–27)
CHLORIDE: 106 mmol/L (ref 96–106)
CO2: 20 mmol/L (ref 20–29)
CREATININE: 1.48 mg/dL — AB (ref 0.57–1.00)
Calcium: 9.2 mg/dL (ref 8.7–10.3)
GFR calc Af Amer: 42 mL/min/{1.73_m2} — ABNORMAL LOW (ref >59)
GFR calc non Af Amer: 36 mL/min/{1.73_m2} — ABNORMAL LOW (ref >59)
GLUCOSE: 89 mg/dL (ref 65–99)
Potassium: 4.8 mmol/L (ref 3.5–5.2)
SODIUM: 141 mmol/L (ref 134–144)

## 2017-02-21 NOTE — Assessment & Plan Note (Signed)
She is currently prescribed and reports adherence to Losartan-HCTZ 100-25 mg daily, Amlodipine 10 mg daily, Atenolol 100 mg daily, and Clonidine 0.1 mg patch weekly. Her BP on arrival was 189/60. BP Readings from Last 3 Encounters:  02/20/17 (!) 189/60  11/14/16 (!) 154/56  08/22/16 (!) 155/54   A/P: Her BP continues to be uncontrolled. She reports significant anxiety/stress as the cause and is very reluctant to change medications (even to consolidate to reduce pills). She is agreeable to increasing clonidine patch dosing. Would need to consider evaluation for renal artery stenosis given her persistent HTN on multiple agents.  Repeat BMET this visit shows increased creatinine to 1.48 with elevated BUN/Cr ratio. I suspect this is secondary to poor hydration in addition to ARB & thiazide use and persistently elevated blood pressure. I attempted to call her with results and plan, however was not able to reach. Have sent message to our front desk. Would like her to follow up in about 2 weeks for a repeat BMET and medication adjustment if needed.

## 2017-02-22 ENCOUNTER — Telehealth: Payer: Self-pay

## 2017-02-22 NOTE — Progress Notes (Signed)
Internal Medicine Clinic Attending  Case discussed with Dr. Patel at the time of the visit.  We reviewed the resident's history and exam and pertinent patient test results.  I agree with the assessment, diagnosis, and plan of care documented in the resident's note.  

## 2017-02-22 NOTE — Telephone Encounter (Signed)
Spoke to patient by phone to discuss lab results. She will follow up on 03/06/17 for repeat BMET.

## 2017-02-22 NOTE — Telephone Encounter (Signed)
Requesting for lab results. Please call back.

## 2017-03-06 ENCOUNTER — Encounter: Payer: Medicare Other | Admitting: Internal Medicine

## 2017-03-11 ENCOUNTER — Telehealth: Payer: Self-pay | Admitting: Dietician

## 2017-03-11 ENCOUNTER — Other Ambulatory Visit: Payer: Self-pay | Admitting: Internal Medicine

## 2017-03-11 NOTE — Telephone Encounter (Signed)
Chelsea [name] is a 67 y.o. female who was contacted on behalf of [facility] Geriatrics Task Force.  03/11/2017- No answer and unable to leave a message.  03/12/2017  Diabetes Assessment  DM meds and BS checks -  "What medications are you taking for diabetes?" metformin, Novolog 27 before break, 22 before dinner -  "How often do you check your blood sugars at home?" 3x/day,  - "What have your blood sugars been?" 145/150, this am, they are better now after being high over the holidays  High Blood Pressure Assessment  BP meds & BP checks -  "What medications are you taking for high blood pressure?" clonidine, amlodipine, hyzaar, atenolol - "How often do you check your blood pressure at home?" has a machine, but she cannot see how to use, she bring it to her next appointment - "What have your blood pressure readings been?" not in the 200s  Coping with DM and BP - What else are you doing to help with your DM and BP -  - Diet? drinking more water, limits snack food, sometimes eats grahams and peanut butter, 1/2 banana, fruit cup - Exercise? Walks in the house, moves more, getting out more, rides leg exerciser 3x/week for 10-15 minutes  Medication Access Issues  Medication Issues? -  "Are you getting your medicines refilled on time without skipping any doses?" no, sometimes misses - "Are you having any problems getting/taking your meds (cost, timing, transportation)?" no - Do you need any meds refilled? no  Conclusion  Close the call - Date of follow-up visit scheduled 04/03/17 - "Any other questions or concerns?" yes- "Does the blood pressure medicine I am taking hurt my kidneys?" I  offered to ask the pharmacist, but she said she'd wait to ask the doctor on 04/03/17. We discussed trying to work in foods high in potasium such as fruits and vegetables to help with blood pressure and sugar control. She denied need for help scheduling with Dr. [place] because she needs to arrange transportation.     Patient  declined advance directives on 02/20/17.

## 2017-04-03 ENCOUNTER — Encounter: Payer: Medicare Other | Admitting: Internal Medicine

## 2017-04-03 ENCOUNTER — Ambulatory Visit: Payer: Medicare Other | Admitting: Dietician

## 2017-04-17 ENCOUNTER — Emergency Department (HOSPITAL_COMMUNITY)
Admission: EM | Admit: 2017-04-17 | Discharge: 2017-04-17 | Disposition: A | Discharge status: Home / Self Care | Payer: Medicare Other | Attending: Emergency Medicine | Admitting: Emergency Medicine

## 2017-04-17 ENCOUNTER — Encounter (HOSPITAL_COMMUNITY): Payer: Self-pay

## 2017-04-17 ENCOUNTER — Other Ambulatory Visit: Payer: Self-pay

## 2017-04-17 DIAGNOSIS — R51 Headache: Secondary | ICD-10-CM | POA: Insufficient documentation

## 2017-04-17 DIAGNOSIS — Z7982 Long term (current) use of aspirin: Secondary | ICD-10-CM | POA: Insufficient documentation

## 2017-04-17 DIAGNOSIS — Z794 Long term (current) use of insulin: Secondary | ICD-10-CM | POA: Diagnosis not present

## 2017-04-17 DIAGNOSIS — E11319 Type 2 diabetes mellitus with unspecified diabetic retinopathy without macular edema: Secondary | ICD-10-CM | POA: Insufficient documentation

## 2017-04-17 DIAGNOSIS — I10 Essential (primary) hypertension: Secondary | ICD-10-CM | POA: Diagnosis not present

## 2017-04-17 DIAGNOSIS — R519 Headache, unspecified: Secondary | ICD-10-CM

## 2017-04-17 DIAGNOSIS — Z79899 Other long term (current) drug therapy: Secondary | ICD-10-CM | POA: Insufficient documentation

## 2017-04-17 DIAGNOSIS — E1169 Type 2 diabetes mellitus with other specified complication: Secondary | ICD-10-CM | POA: Diagnosis not present

## 2017-04-17 DIAGNOSIS — E7849 Other hyperlipidemia: Secondary | ICD-10-CM | POA: Insufficient documentation

## 2017-04-17 MED ORDER — TRAMADOL HCL 50 MG PO TABS: 50.0000 mg | ORAL_TABLET | Freq: Four times a day (QID) | ORAL | 0 refills | Status: DC | PRN

## 2017-04-17 MED ORDER — TRAMADOL HCL 50 MG PO TABS
50.0000 mg | ORAL_TABLET | Freq: Once | ORAL | Status: AC
  Administered 2017-04-17: 50 mg via ORAL
  Filled 2017-04-17: qty 1

## 2017-04-17 NOTE — ED Triage Notes (Signed)
Patient complains of headache x 2 days. States that it started after cleaning and using cleaning products. States that she thinks is related to her sinuses, NAD. Alert and oriented

## 2017-04-17 NOTE — ED Provider Notes (Signed)
Medical screening examination/treatment/procedure(s) were conducted as a shared visit with non-physician practitioner(s) and myself.  I personally evaluated the patient during the encounter.  Pt has a benign sounding headache.  No neuro sx.  No vomiting.  Not worst of life.  Pt would just like something other than tylenol to take because it has not been effective.  At this time there does not appear to be any evidence of an acute emergency medical condition and the patient appears stable for discharge with appropriate outpatient follow up.    Dorie Rank, MD 04/17/17 [phone]

## 2017-04-17 NOTE — Discharge Instructions (Signed)
You were seen in the emergency department today for a headache. You were given a dose of tramadol in the emergency department for your headache. We have given you a prescription for a few tablets of this which you may take as needed. Try taking tylenol first and if your headache is not improved after this take the Tramadol.   Additionally your blood pressure is elevated in the emergency department today. Be sure to have this rechecked by your primary care provider. Follow up with your primary care provider regarding your blood pressure and your headache as scheduled next week. Return to the emergency department for any new or worsening symptoms including, but not limited to worsening pain, change in your vision, dizziness, lightheadedness, numbness, weakness, or any other concerns you may have.
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
## 2017-04-22 ENCOUNTER — Other Ambulatory Visit: Payer: Self-pay | Admitting: Internal Medicine

## 2017-04-22 NOTE — Telephone Encounter (Signed)
Next appt scheduled  3/13 with PCP.

## 2017-04-24 ENCOUNTER — Ambulatory Visit (INDEPENDENT_AMBULATORY_CARE_PROVIDER_SITE_OTHER): Payer: Medicare Other | Admitting: Internal Medicine

## 2017-04-24 ENCOUNTER — Other Ambulatory Visit: Payer: Self-pay

## 2017-04-24 ENCOUNTER — Encounter: Payer: Self-pay | Admitting: Internal Medicine

## 2017-04-24 VITALS — BP 116/72 | HR 68 | Temp 98.2°F | Ht 63.0 in | Wt 249.3 lb

## 2017-04-24 DIAGNOSIS — Z7982 Long term (current) use of aspirin: Secondary | ICD-10-CM

## 2017-04-24 DIAGNOSIS — Z638 Other specified problems related to primary support group: Secondary | ICD-10-CM

## 2017-04-24 DIAGNOSIS — F439 Reaction to severe stress, unspecified: Secondary | ICD-10-CM

## 2017-04-24 DIAGNOSIS — J45909 Unspecified asthma, uncomplicated: Secondary | ICD-10-CM | POA: Diagnosis not present

## 2017-04-24 DIAGNOSIS — I10 Essential (primary) hypertension: Secondary | ICD-10-CM

## 2017-04-24 DIAGNOSIS — E11319 Type 2 diabetes mellitus with unspecified diabetic retinopathy without macular edema: Secondary | ICD-10-CM | POA: Diagnosis not present

## 2017-04-24 DIAGNOSIS — E113593 Type 2 diabetes mellitus with proliferative diabetic retinopathy without macular edema, bilateral: Secondary | ICD-10-CM

## 2017-04-24 DIAGNOSIS — Z79899 Other long term (current) drug therapy: Secondary | ICD-10-CM

## 2017-04-24 DIAGNOSIS — Z794 Long term (current) use of insulin: Secondary | ICD-10-CM

## 2017-04-24 DIAGNOSIS — Z0000 Encounter for general adult medical examination without abnormal findings: Secondary | ICD-10-CM

## 2017-04-24 DIAGNOSIS — M1990 Unspecified osteoarthritis, unspecified site: Secondary | ICD-10-CM | POA: Diagnosis not present

## 2017-04-24 DIAGNOSIS — I152 Hypertension secondary to endocrine disorders: Secondary | ICD-10-CM

## 2017-04-24 DIAGNOSIS — E1159 Type 2 diabetes mellitus with other circulatory complications: Secondary | ICD-10-CM | POA: Diagnosis present

## 2017-04-24 LAB — GLUCOSE, CAPILLARY: GLUCOSE-CAPILLARY: 62 mg/dL — AB (ref 65–99)

## 2017-04-24 MED ORDER — INSULIN ASPART PROT & ASPART (70-30 MIX) 100 UNIT/ML PEN: PEN_INJECTOR | SUBCUTANEOUS | 11 refills | Status: DC

## 2017-04-24 NOTE — Assessment & Plan Note (Signed)
She reports increased stress at home due to family issues and illnesses. This is interfering with her ability to obtain adequate sleep at night. She says she is only getting about 4 hours per day. She denies any snoring, waking at night short of breath, or morning headaches. A/P: Patient with increased stress due to family factors which is interfering with obtaining quality sleep. She has attended group therapy in the past but does not want to consider this again at this time. She will try over the counter Melatonin for sleep.

## 2017-04-24 NOTE — Assessment & Plan Note (Signed)
She has been taking Aspirin 81 mg daily for several years now. She has no history of Stroke/TIA or heart disease. A/P: She has no indication for secondary prophylaxis with Aspirin. Her Has-bled score with aspirin is 2 compared to 1 without aspirin. Per patient preference, we will discontinue ASA 81 mg daily.

## 2017-04-24 NOTE — Patient Instructions (Addendum)
It was a pleasure to see you again Chelsea [name].  Your blood pressure is great. The amlodipine can sometimes cause leg swelling. Please stop the Amlodipine to see if your swelling improves.  For your diabetes: - Decrease Novolog 70/30 to 25 units in the morning. Continue 22 units at night. - Continue Metformin  For sleep, you can try over the counter melatonin.  We will stop the low dose aspirin as this is not likely providing any benefit but may increase risk of bleeding.  Please schedule an appointment with your eye doctor.  Please follow up with me as scheduled or sooner if needed.

## 2017-04-24 NOTE — Assessment & Plan Note (Addendum)
Chelsea [name] is currently taking NovoLog 70/30 (27 units a.m. and 22 units p.m. with meals) as well as metformin XR 500 mg twice daily (has not tolerated higher doses due to GI issues) for her diabetes.  She did not bring her meter today.  Her CBG on arrival today is 57.  She says she did eat a peanut butter jelly sandwich for lunch prior to arrival. She denies any hypoglycemic symptoms including lightheadedness, dizziness, diaphoresis, dyspnea, or palpitations. Her last A1c was 7.3 on 02/20/17. A/P: She continues to have hypoglycemia which she may not be feeling while on a beta blocker. We will decrease her insulin as follows: - Decrease Novolog 70-30 to 22 units am and pm - discussed with Ms. Murdaugh by phone as dosing is mistyped in the AVS. She exhibits understanding. - Continue Metformin XR 500 mg BID - Patient advised to monitor CBGs for hypoglycemia - She was given orange juice and glucose gel in clinic
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
## 2017-04-24 NOTE — Assessment & Plan Note (Addendum)
Chelsea [name] is currently prescribed and reports adherence to losartan-HCTZ 100-25 mg daily, amlodipine 10 mg daily, atenolol 100 mg daily, and clonidine 0.2 mg patch weekly.  Her BP on arrival today is 116/72.  She denies any recent lightheadedness, dizziness, loss of consciousness, or falls.  She does have swelling in both of her legs which has been ongoing for some time. A/P: Her blood pressure is very well controlled today after being difficult to control for some time now.  Given her lower extremity swelling I would like to hold her amlodipine to see if this is a medication side effect.  We will otherwise continue her other antihypertensive medications. -Hold amlodipine 10 mg daily - can consider adding a different CCB back if needed -Continue losartan-HCTZ 100-25 mg daily -Continue atenolol 100 mg daily -Continue clonidine 0.2 mg patch weekly

## 2017-04-26 ENCOUNTER — Ambulatory Visit: Payer: Medicare Other | Admitting: Podiatry

## 2017-04-26 NOTE — Progress Notes (Signed)
Internal Medicine Clinic Attending  Case discussed with Dr. Patel at the time of the visit.  We reviewed the resident's history and exam and pertinent patient test results.  I agree with the assessment, diagnosis, and plan of care documented in the resident's note.  

## 2017-04-26 NOTE — Addendum Note (Signed)
Addended by: Aldine Contes on: 04/26/2017 10:02 AM   Modules accepted: Level of Service

## 2017-05-14 ENCOUNTER — Encounter: Payer: Self-pay | Admitting: Podiatry

## 2017-05-14 ENCOUNTER — Ambulatory Visit (INDEPENDENT_AMBULATORY_CARE_PROVIDER_SITE_OTHER): Payer: Medicare Other | Admitting: Podiatry

## 2017-05-14 DIAGNOSIS — B351 Tinea unguium: Secondary | ICD-10-CM

## 2017-05-14 DIAGNOSIS — M79676 Pain in unspecified toe(s): Secondary | ICD-10-CM | POA: Diagnosis not present

## 2017-05-14 DIAGNOSIS — E1149 Type 2 diabetes mellitus with other diabetic neurological complication: Secondary | ICD-10-CM | POA: Diagnosis not present

## 2017-05-14 DIAGNOSIS — Q828 Other specified congenital malformations of skin: Secondary | ICD-10-CM

## 2017-05-14 NOTE — Progress Notes (Addendum)
[name], female   DOB: Nov 02, 1950, 67 y.o.   MRN: [id number] Complaint:  Visit Type: Patient returns to my office for continued preventative foot care services. Complaint: Patient states" my nails have grown long and thick and become painful to walk and wear shoes" Patient has been diagnosed with DM with neuropathy He presents for preventative foot care services. No changes to ROS.  Painful callus under the balls of both feet.  Podiatric Exam: Vascular: dorsalis pedis and posterior tibial pulses are palpable bilateral. Capillary return is immediate. Temperature gradient is WNL. Skin turgor WNL  Sensorium: Diminished Semmes Weinstein monofilament test. Normal tactile sensation bilaterally. Nail Exam: Pt has thick disfigured discolored nails with subungual debris noted bilateral entire nail hallux through fifth toenails Ulcer Exam: There is no evidence of ulcer or pre-ulcerative changes or infection. Orthopedic Exam: Muscle tone and strength are WNL. No limitations in general ROM. No crepitus or effusions noted. Foot type and digits show no abnormalities. DJD 1st MPJ B/L. Skin:  Porokeratosis sib 1,3 left and sub 5 right. No infection or ulcers.    Diagnosis:  Tinea unguium, Pain in right toe, pain in left toes  porokeratosis  Treatment & Plan Procedures and Treatment: Consent by patient was obtained for treatment procedures. The patient understood the discussion of treatment and procedures well. All questions were answered thoroughly reviewed. Debridement of mycotic and hypertrophic toenails, 1 through 5 bilateral and clearing of subungual debris. No ulceration, no infection noted. Debride porokeratosis  B/L ABN signed for 2019. [name] KitchenReturn Visit-Office Procedure: Patient instructed to return to the office for a follow up visit 3 months for continued evaluation and treatment.   Gardiner Barefoot DPM

## 2017-05-15 ENCOUNTER — Encounter: Payer: Medicare Other | Admitting: Internal Medicine

## 2017-06-12 DIAGNOSIS — E119 Type 2 diabetes mellitus without complications: Secondary | ICD-10-CM | POA: Diagnosis not present

## 2017-06-12 LAB — HM DIABETES EYE EXAM

## 2017-06-17 ENCOUNTER — Other Ambulatory Visit: Payer: Self-pay | Admitting: Internal Medicine

## 2017-06-17 NOTE — Telephone Encounter (Signed)
On her last strip. Has "called [facility] and they need that code again". Called [facility]- they got them to go through.called patient and told her she could pick them up

## 2017-06-25 LAB — HM DIABETES EYE EXAM

## 2017-06-26 ENCOUNTER — Encounter: Payer: Medicare Other | Admitting: Internal Medicine

## 2017-07-03 ENCOUNTER — Ambulatory Visit (INDEPENDENT_AMBULATORY_CARE_PROVIDER_SITE_OTHER): Payer: Medicare Other | Admitting: Internal Medicine

## 2017-07-03 ENCOUNTER — Encounter: Payer: Self-pay | Admitting: *Deleted

## 2017-07-03 ENCOUNTER — Encounter: Payer: Self-pay | Admitting: Internal Medicine

## 2017-07-03 ENCOUNTER — Other Ambulatory Visit: Payer: Self-pay

## 2017-07-03 VITALS — BP 209/60 | HR 58 | Temp 97.8°F | Ht 63.0 in | Wt 245.1 lb

## 2017-07-03 DIAGNOSIS — M1990 Unspecified osteoarthritis, unspecified site: Secondary | ICD-10-CM

## 2017-07-03 DIAGNOSIS — I10 Essential (primary) hypertension: Secondary | ICD-10-CM | POA: Diagnosis not present

## 2017-07-03 DIAGNOSIS — J45909 Unspecified asthma, uncomplicated: Secondary | ICD-10-CM | POA: Diagnosis not present

## 2017-07-03 DIAGNOSIS — E1159 Type 2 diabetes mellitus with other circulatory complications: Secondary | ICD-10-CM | POA: Diagnosis not present

## 2017-07-03 DIAGNOSIS — F4321 Adjustment disorder with depressed mood: Secondary | ICD-10-CM | POA: Diagnosis not present

## 2017-07-03 DIAGNOSIS — E11319 Type 2 diabetes mellitus with unspecified diabetic retinopathy without macular edema: Secondary | ICD-10-CM

## 2017-07-03 DIAGNOSIS — F329 Major depressive disorder, single episode, unspecified: Secondary | ICD-10-CM

## 2017-07-03 DIAGNOSIS — E113593 Type 2 diabetes mellitus with proliferative diabetic retinopathy without macular edema, bilateral: Secondary | ICD-10-CM | POA: Diagnosis not present

## 2017-07-03 DIAGNOSIS — F331 Major depressive disorder, recurrent, moderate: Secondary | ICD-10-CM

## 2017-07-03 DIAGNOSIS — Z794 Long term (current) use of insulin: Secondary | ICD-10-CM

## 2017-07-03 DIAGNOSIS — Z79899 Other long term (current) drug therapy: Secondary | ICD-10-CM

## 2017-07-03 DIAGNOSIS — I152 Hypertension secondary to endocrine disorders: Secondary | ICD-10-CM

## 2017-07-03 DIAGNOSIS — F32A Depression, unspecified: Secondary | ICD-10-CM

## 2017-07-03 LAB — POCT GLYCOSYLATED HEMOGLOBIN (HGB A1C): Hemoglobin A1C: 8.6 % — AB (ref 4.0–5.6)

## 2017-07-03 LAB — GLUCOSE, CAPILLARY: Glucose-Capillary: 136 mg/dL — ABNORMAL HIGH (ref 65–99)

## 2017-07-03 MED ORDER — FLUOXETINE HCL (PMDD) 20 MG PO CAPS: 20.0000 mg | ORAL_CAPSULE | Freq: Every day | ORAL | 2 refills | Status: DC

## 2017-07-03 MED ORDER — INSULIN ASPART PROT & ASPART (70-30 MIX) 100 UNIT/ML PEN: PEN_INJECTOR | SUBCUTANEOUS | 11 refills | Status: DC

## 2017-07-03 MED ORDER — VERAPAMIL HCL ER 120 MG PO TBCR: 120.0000 mg | EXTENDED_RELEASE_TABLET | Freq: Every day | ORAL | 2 refills | Status: DC

## 2017-07-03 NOTE — Assessment & Plan Note (Addendum)
She is currently prescribed Losartan-HCTZ 100-25 mg daily, Atenolol 100 mg daily, and Clonidine 0.2 mg patch weekly. Amlodipine was held on last visit due to lower extremity swelling. She does report adherence despite significant life stressors since her last visit. Her blood pressure is elevated on this visit at 213/59 and 209/60 on repeat. She denies any headaches or change in vision.  A/P: BP Readings from Last 3 Encounters:  07/03/17 (!) 209/60  04/24/17 116/72  04/17/17 (!) 157/83   She again has uncontrolled hypertension and reports adherence to her current medications.  Repeat bmet this visit shows an elevated creatinine of 1.54 stable since 4 months ago but still above her baseline normal renal function.  This is likely related to her uncontrolled hypertension, diabetes, and potentially medication side effect.  She has been on atenolol for many years which may not be providing much benefit for her HTN.  We will make the following changes: -Continue losartan 100 mg daily -Stop HCTZ -Stop atenolol -Start verapamil 120 mg daily; can increase if needed and tolerated -Increase clonidine patch to 0.3 mg weekly -Follow-up on 07/26/2017 scheduled - can see me in [facility] -I discussed the lab results and above changes with Chelsea [name] over the phone and she expresses understanding.  She is advised to call for any further questions.

## 2017-07-03 NOTE — Patient Instructions (Signed)
It was a pleasure to see you again Chelsea [name].  Please increase your Insulin Novolog 70/30 to 25 units in the morning and 25 units in the evening.  We will start Fluoxetine (Prozac) to help with mood. Please consider counseling as well.  We will start Verapamil 120 mg once a day for blood pressure.  Please follow up with [place] in about 3-4 weeks for a recheck.
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
## 2017-07-03 NOTE — Assessment & Plan Note (Addendum)
She is currently taking Novolog 70/30 22 units am and pm as well as Metformin XR 500 mg BID (has not tolerated higher doses due to GI side effects). Her last A1c was 7.3 on 02/20/17.  She reports adherence to her current medications but feels that she has taken a step back in her lifestyle modifications due to significant life stressors.  She denies any hypoglycemia but has noticed higher than usual blood sugars at home. A/P: Repeat A1c 8.6% this visit shows worsening glycemic control.  We will increase her insulin slightly in the attempt to avoid hypoglycemia seen on prior clinic visits. -Increase Novolog 70/30 to 25 units am 25 units pm -Continue metformin XR 500 mg twice daily -Bring meter on follow-up for reassessment

## 2017-07-04 ENCOUNTER — Telehealth: Payer: Self-pay | Admitting: Internal Medicine

## 2017-07-04 LAB — BMP8+ANION GAP
Anion Gap: 16 mmol/L (ref 10.0–18.0)
BUN / CREAT RATIO: 19 (ref 12–28)
BUN: 30 mg/dL — AB (ref 8–27)
CO2: 21 mmol/L (ref 20–29)
CREATININE: 1.54 mg/dL — AB (ref 0.57–1.00)
Calcium: 9.4 mg/dL (ref 8.7–10.3)
Chloride: 105 mmol/L (ref 96–106)
GFR calc Af Amer: 40 mL/min/{1.73_m2} — ABNORMAL LOW (ref >59)
GFR calc non Af Amer: 34 mL/min/{1.73_m2} — ABNORMAL LOW (ref >59)
Glucose: 132 mg/dL — ABNORMAL HIGH (ref 65–99)
Potassium: 4.8 mmol/L (ref 3.5–5.2)
Sodium: 142 mmol/L (ref 134–144)

## 2017-07-04 MED ORDER — LOSARTAN POTASSIUM 100 MG PO TABS: 100.0000 mg | ORAL_TABLET | Freq: Every day | ORAL | 3 refills | Status: DC

## 2017-07-04 MED ORDER — CLONIDINE 0.3 MG/24HR TD PTWK: 0.3000 mg | MEDICATED_PATCH | TRANSDERMAL | 2 refills | Status: DC

## 2017-07-04 NOTE — Telephone Encounter (Signed)
Discussed lab results and change in plan with Chelsea [name] by phone as documented in progress note for clinic visit 07/03/17. She expresses understanding and is advised to call for further questions.

## 2017-07-04 NOTE — Telephone Encounter (Signed)
Pt would like a call back about her lab drawn on yesterday.

## 2017-07-04 NOTE — Assessment & Plan Note (Signed)
Chelsea [name] reports significant grief and depression recently after the unfortunate passing of her close sister.  She reports many issues have been occurring over the last several months and her personal life she has caused significant stress and diminished motivation to work on her lifestyle modifications.  She is having difficulty sleeping.  She has had on and off episodes of depression over the last year which have temporarily been treated with duloxetine first then sertraline, however she self discontinued these.  She has gone to counseling in the past with benefit but has declined further counseling recently.  A/P: PHQ-9 this visit is 13 indicating moderate depression with grief.  Review of records show that she has been on fluoxetine in the past with benefit which we will restart.  Also advised her to consider counseling again and we have provided resources for this. -Start fluoxetine 20 mg daily; can increase if needed -Recommend counseling -Reassess in 4 to 6 weeks

## 2017-07-05 ENCOUNTER — Telehealth: Payer: Self-pay | Admitting: *Deleted

## 2017-07-05 NOTE — Progress Notes (Signed)
Internal Medicine Clinic Attending  Case discussed with Dr. Patel at the time of the visit.  We reviewed the resident's history and exam and pertinent patient test results.  I agree with the assessment, diagnosis, and plan of care documented in the resident's note.  

## 2017-07-05 NOTE — Telephone Encounter (Signed)
Went over meds w/ pt, she had gotten 2 bottles of losartan, after speaking w/ pharm and having pt read amt in each it was determined that 1 bottle had 79 tabs and 2nd bottle had 11 tabs = 90. She was grateful for the explanation

## 2017-07-22 ENCOUNTER — Other Ambulatory Visit: Payer: Self-pay | Admitting: Internal Medicine

## 2017-07-22 ENCOUNTER — Telehealth: Payer: Self-pay | Admitting: Internal Medicine

## 2017-07-22 NOTE — Telephone Encounter (Signed)
Patient has questions about verapamil (CALAN-SR) 120 MG CR tablet

## 2017-07-22 NOTE — Telephone Encounter (Signed)
Called pt, clarified dose, pt repeated back, call ended

## 2017-07-26 ENCOUNTER — Ambulatory Visit: Payer: Medicare Other | Admitting: Pharmacist

## 2017-07-26 ENCOUNTER — Other Ambulatory Visit: Payer: Self-pay

## 2017-07-26 ENCOUNTER — Ambulatory Visit (INDEPENDENT_AMBULATORY_CARE_PROVIDER_SITE_OTHER): Payer: Medicare Other | Admitting: Internal Medicine

## 2017-07-26 VITALS — BP 190/63 | HR 64 | Temp 97.6°F | Ht 63.0 in | Wt 245.9 lb

## 2017-07-26 DIAGNOSIS — Z794 Long term (current) use of insulin: Principal | ICD-10-CM

## 2017-07-26 DIAGNOSIS — F329 Major depressive disorder, single episode, unspecified: Secondary | ICD-10-CM | POA: Diagnosis not present

## 2017-07-26 DIAGNOSIS — I10 Essential (primary) hypertension: Secondary | ICD-10-CM | POA: Diagnosis not present

## 2017-07-26 DIAGNOSIS — E113593 Type 2 diabetes mellitus with proliferative diabetic retinopathy without macular edema, bilateral: Secondary | ICD-10-CM

## 2017-07-26 DIAGNOSIS — E1159 Type 2 diabetes mellitus with other circulatory complications: Secondary | ICD-10-CM | POA: Diagnosis present

## 2017-07-26 DIAGNOSIS — I152 Hypertension secondary to endocrine disorders: Secondary | ICD-10-CM

## 2017-07-26 DIAGNOSIS — F32A Depression, unspecified: Secondary | ICD-10-CM

## 2017-07-26 MED ORDER — VERAPAMIL HCL ER 180 MG PO TBCR: 180.0000 mg | EXTENDED_RELEASE_TABLET | Freq: Every day | ORAL | 0 refills | Status: DC
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
## 2017-07-26 NOTE — Progress Notes (Addendum)
Freestyle Libre Pro CGM sensor placed and started. Patient was educated about wearing sensor, keeping food, activity and medication log and when to call office. Follow up was arranged with the patient.   LOT [id number] C EXP 12/12/17 SN [id number]

## 2017-07-26 NOTE — Progress Notes (Signed)
Patient was seen today in a co-visit between the physician and pharmacist. CGM placed  See documentation under Dr. Serita Grit visit for details.

## 2017-07-26 NOTE — Patient Instructions (Addendum)
It was a pleasure to see you [name].  I am increasing your blood pressure medicine Verapamil to 180 mg daily.  Please continue your other medications as prescribed.  We are placing a continuous glucose monitor today.  Please record the time, amount and what food drinks and activities you have while wearing the continuous glucose monitor(CGM) in the folder provided.  Bring the folder with you to follow up appointments  Do not have a CT or an MRI while wearing the CGM.   Please make an appointment for 1 week with me and a doctor for the first of two CGM downloads..   You will also return in 2 weeks to have your second download and the CGM removed.

## 2017-07-26 NOTE — Assessment & Plan Note (Signed)
She is currently taking Losartan 100 mg daily, Verapamil 120 mg daily, and Clonidine patch 0.3 mg weekly. She reports adherence. BP on arrival is 188/55 and 190/63 on repeat. BP Readings from Last 3 Encounters:  07/26/17 (!) 190/63  07/03/17 (!) 209/60  04/24/17 116/72   A/P: HTN remains uncontrolled. Will increase Verapamil and monitor heart rate and for symptoms of lightheadedness/dizziness given low diastolic pressure. - Increase Verapamil to 180 mg daily - can go up to 240 if needed - Continue Losartan 100 mg daily - Continue Clonidine 0.3 mg patch weekly

## 2017-07-26 NOTE — Assessment & Plan Note (Signed)
She was started on Fluoxetine 20 mg daily last visit due to depression related to significant life stressors. She says she has not yet taken this. A/P: Last PHQ-9 was 13. PHQ-2 today was 1. She will try Fluoxetine 20 mg daily. Will reassess in 4-6 weeks.

## 2017-07-26 NOTE — Assessment & Plan Note (Signed)
Last A1c was 8.6%. She is now taking Novolog 70/30 25 units twice a day and Metformin XR 500 mg BID (did not tolerate higher doses due to GI side effects). She did not bring her meter but reports most readings 190-200. She denies any symptoms of hyper/hypoglycemia. She admits to inconsistent meals. A/P: Hard to determine glycemic control. CGM placed today to better assess daily readings. She will continue current medications and follow up in about 7 days for CGM readings. - CGM placed - f/u ~7days - Continue Novolog 70/30 25 units BID - Continue Metformin XR 500 mg BID

## 2017-07-29 NOTE — Progress Notes (Signed)
Internal Medicine Clinic Attending  Case discussed with Dr. Patel at the time of the visit.  We reviewed the resident's history and exam and pertinent patient test results.  I agree with the assessment, diagnosis, and plan of care documented in the resident's note.  

## 2017-08-01 ENCOUNTER — Other Ambulatory Visit: Payer: Self-pay

## 2017-08-01 ENCOUNTER — Ambulatory Visit: Payer: Medicare Other | Admitting: Pharmacist

## 2017-08-01 ENCOUNTER — Ambulatory Visit (INDEPENDENT_AMBULATORY_CARE_PROVIDER_SITE_OTHER): Payer: Medicare Other | Admitting: Internal Medicine

## 2017-08-01 ENCOUNTER — Other Ambulatory Visit: Payer: Self-pay | Admitting: Internal Medicine

## 2017-08-01 ENCOUNTER — Encounter: Payer: Self-pay | Admitting: Internal Medicine

## 2017-08-01 VITALS — BP 173/64 | HR 70 | Temp 98.3°F | Ht 63.0 in | Wt 244.7 lb

## 2017-08-01 DIAGNOSIS — M1990 Unspecified osteoarthritis, unspecified site: Secondary | ICD-10-CM

## 2017-08-01 DIAGNOSIS — E11319 Type 2 diabetes mellitus with unspecified diabetic retinopathy without macular edema: Secondary | ICD-10-CM | POA: Diagnosis not present

## 2017-08-01 DIAGNOSIS — I152 Hypertension secondary to endocrine disorders: Secondary | ICD-10-CM

## 2017-08-01 DIAGNOSIS — E113593 Type 2 diabetes mellitus with proliferative diabetic retinopathy without macular edema, bilateral: Secondary | ICD-10-CM

## 2017-08-01 DIAGNOSIS — F329 Major depressive disorder, single episode, unspecified: Secondary | ICD-10-CM

## 2017-08-01 DIAGNOSIS — I10 Essential (primary) hypertension: Secondary | ICD-10-CM | POA: Diagnosis not present

## 2017-08-01 DIAGNOSIS — Z79899 Other long term (current) drug therapy: Secondary | ICD-10-CM

## 2017-08-01 DIAGNOSIS — Z794 Long term (current) use of insulin: Secondary | ICD-10-CM | POA: Diagnosis not present

## 2017-08-01 DIAGNOSIS — E1159 Type 2 diabetes mellitus with other circulatory complications: Secondary | ICD-10-CM

## 2017-08-01 MED ORDER — INSULIN ASPART PROT & ASPART (70-30 MIX) 100 UNIT/ML PEN: PEN_INJECTOR | SUBCUTANEOUS | 11 refills | Status: DC

## 2017-08-01 NOTE — Progress Notes (Signed)
Medicine attending: Medical history, presenting problems, physical findings, and medications, reviewed with resident physician Dr Vishal Patel on the day of the patient visit and I concur with his evaluation and management plan. 

## 2017-08-01 NOTE — Telephone Encounter (Signed)
rtc to pt, vmail not setup, will try later

## 2017-08-01 NOTE — Patient Instructions (Addendum)
It was a pleasure to see you again Chelsea [name].  Please take Novolog 70/30 22 units with breakfast and dinner. Your blood sugars are mostly in target range.  Please check your blood sugars before each meal.  Please check your pharmacy for your blood pressure medication Verapamil 180 mg once daily. Call us if there are any issues.  Follow up with [place] again in 1 week to recheck your glucose monitor.

## 2017-08-01 NOTE — Progress Notes (Signed)
Patient was seen today in a co-visit between the physician and pharmacist. Patient education was provided on hypoglycemia.  See documentation under Dr. Serita Grit visit for details.
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
## 2017-08-01 NOTE — Assessment & Plan Note (Addendum)
She is currently prescribed Losartan 100 mg daily and Verapamil 180 mg daily and Clonidine 0.3 mg patch weekly. She says her pharmacy did not have the Verapamil 180 mg daily, so has been taking 120 mg dosing for the last week. BP on arrival was 174/62. Repeat BP is 173/64. A/P: Patient with uncontrolled HTN. She has not started the increased dose of Verapamil therefore I have not made a change today. She is advised to call us if there are any issues with obtaining her medications. - Continue Losartan 100 mg daily - Start Verapamil 180 mg daily; can increase to 240 if needed and HR tolerates - Continue Clonidine 0.3 mg patch weekly

## 2017-08-01 NOTE — Assessment & Plan Note (Addendum)
Her last A1c was 8.6%. She has been taking Novolog 70/30 25 units BID and Metformin XR 500 mg BID (did not tolerate higher doses due to GI side effects). She presents for CGM interpretation and intervention after placement on 07/26/17. She did self-decrease her insulin to 22 units twice a day due to seeing lows on the morning of 07/30/17. A/P: [name] wore the CGM for 7 days. The average reading was 130, % time in target was 83, % time below target was 8, and % time above target was. 9. Intervention will be to keep Novolog 70/30 at 22 units twice a day with breakfast and dinner. The patient will be scheduled to see [place] again for a final appointment in 1 week.  - Novolog 70/30 22 units with breakfast and dinner - Check CBGs TID before meals - Continue Metformin XR 500 mg BID - f/u in 1 week for CGM recheck

## 2017-08-01 NOTE — Telephone Encounter (Signed)
Patient is calling checking on the quanity of blood pressure

## 2017-08-01 NOTE — Telephone Encounter (Signed)
Pt rtc, she wanted to verify the verapamil, went over verapamil 180mg  and f/u 6/27, she repeated back and call ended

## 2017-08-08 ENCOUNTER — Encounter: Payer: Self-pay | Admitting: Dietician

## 2017-08-08 ENCOUNTER — Other Ambulatory Visit: Payer: Self-pay

## 2017-08-08 ENCOUNTER — Encounter: Payer: Self-pay | Admitting: Internal Medicine

## 2017-08-08 ENCOUNTER — Ambulatory Visit (INDEPENDENT_AMBULATORY_CARE_PROVIDER_SITE_OTHER): Payer: Medicare Other | Admitting: Dietician

## 2017-08-08 ENCOUNTER — Ambulatory Visit (INDEPENDENT_AMBULATORY_CARE_PROVIDER_SITE_OTHER): Payer: Medicare Other | Admitting: Internal Medicine

## 2017-08-08 ENCOUNTER — Encounter: Payer: Medicare Other | Admitting: Dietician

## 2017-08-08 ENCOUNTER — Other Ambulatory Visit: Payer: Self-pay | Admitting: Internal Medicine

## 2017-08-08 VITALS — BP 194/61 | HR 67 | Temp 98.5°F | Ht 63.0 in | Wt 246.0 lb

## 2017-08-08 DIAGNOSIS — R012 Other cardiac sounds: Secondary | ICD-10-CM | POA: Diagnosis not present

## 2017-08-08 DIAGNOSIS — Z79899 Other long term (current) drug therapy: Secondary | ICD-10-CM | POA: Diagnosis not present

## 2017-08-08 DIAGNOSIS — Z713 Dietary counseling and surveillance: Secondary | ICD-10-CM | POA: Diagnosis not present

## 2017-08-08 DIAGNOSIS — I10 Essential (primary) hypertension: Secondary | ICD-10-CM

## 2017-08-08 DIAGNOSIS — F329 Major depressive disorder, single episode, unspecified: Secondary | ICD-10-CM

## 2017-08-08 DIAGNOSIS — M1990 Unspecified osteoarthritis, unspecified site: Secondary | ICD-10-CM | POA: Diagnosis not present

## 2017-08-08 DIAGNOSIS — Z794 Long term (current) use of insulin: Secondary | ICD-10-CM

## 2017-08-08 DIAGNOSIS — E1159 Type 2 diabetes mellitus with other circulatory complications: Secondary | ICD-10-CM | POA: Diagnosis not present

## 2017-08-08 DIAGNOSIS — E113593 Type 2 diabetes mellitus with proliferative diabetic retinopathy without macular edema, bilateral: Secondary | ICD-10-CM

## 2017-08-08 DIAGNOSIS — E11319 Type 2 diabetes mellitus with unspecified diabetic retinopathy without macular edema: Secondary | ICD-10-CM | POA: Diagnosis not present

## 2017-08-08 DIAGNOSIS — I152 Hypertension secondary to endocrine disorders: Secondary | ICD-10-CM

## 2017-08-08 MED ORDER — INSULIN ASPART PROT & ASPART (70-30 MIX) 100 UNIT/ML PEN: PEN_INJECTOR | SUBCUTANEOUS | 11 refills | Status: DC

## 2017-08-08 MED ORDER — HYDRALAZINE HCL 50 MG PO TABS: 50.0000 mg | ORAL_TABLET | Freq: Three times a day (TID) | ORAL | 0 refills | Status: DC

## 2017-08-08 NOTE — Assessment & Plan Note (Addendum)
She is currently taking losartan 100 mg daily, verapamil 180 mg daily, and clonidine 0.3 mg patch weekly.  She reports adherence and says she changes her clonidine patch every Wednesday.  BP on arrival was 209/61 and 194/61 on repeat.  She says she has not taken her medications this morning.  She also admits to salt loads with breakfast sausages and pork. A/P: Patient with persistently uncontrolled hypertension.  She was previously on a thiazide diuretic which was discontinued due to worsening renal function.  She has not been on spironolactone due to borderline high potassium levels.  Given her persistent hypertension, we will consider further evaluation for secondary causes.  I discussed obtaining an ultrasound to assess for renal artery stenosis, however patient does not want to schedule this at this time and will reconsider at follow-up.  Would have less suspicion for hyperaldosteronism given normal to high normal potassium levels. -Start hydralazine 50 mg 3 times daily -Continue losartan 100 mg daily -Continue verapamil 180 mg daily -Consider evaluation for renal artery stenosis and follow-up -Limit sodium, DASH diet
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
## 2017-08-08 NOTE — Progress Notes (Signed)
Continuous glucose monitoring Documentation: follow up week 2:  Met briefly with patient and Dr. Roxanne Mins to discuss the Continuous glucose monitoring download . [name] wore the Freestyle CGM for 13 days- suggest  Decreasing her Pm dose of insulin by 10-20% as she continues with nocturnal and evening hypoglycemia-  Also suggest Medical nutrition therapy follow up to assist her with some of the post meal readings. Repeat CGM in 60-90 days  AverageGlucose149m/dL Time In Range: 6% Above 180 mg/dL (above 250 mg/dL: 0%) 79% In Target Range   70-180 mg/dL 15% Below 70 mg/dL (below 54 mg/dL: 3%) Coefficientof Variation(CV)36.6%  Standard Deviation(SD)mg/dL41.710-26*  Chelsea [name], RD 08/08/2017 12:16 PM.

## 2017-08-08 NOTE — Patient Instructions (Addendum)
It was a pleasure to see you again Ms. Haywood.  Please take your Novolog 70/30 insulin as follows: - 22 units in the morning with breakfast - 18 units in the evening with dinner  I am adding Hydralazine 50 mg three times per day for blood pressure.  Continue your other medications as prescribed.  Please schedule a follow up appointment with Ms. [name] Plyler.  Please follow up with [place] again in about 30-40 days for a recheck or sooner if needed.

## 2017-08-08 NOTE — Assessment & Plan Note (Signed)
Fixed split S2 heard on exam this visit, new compared to prior. A/P: Fixed split S2 loudest in the pulmonic region.  Recommend echocardiogram for further evaluation, patient declined this visit and will consider on follow-up.

## 2017-08-08 NOTE — Telephone Encounter (Signed)
Refill Request    losartan (COZAAR) 100 MG tablet

## 2017-08-08 NOTE — Assessment & Plan Note (Addendum)
Last A1c was 8.6 on 07/03/2017.  She is being seen for 1 week follow-up for CGM interpretation.  Currently taking NovoLog 70/30 22 units with breakfast and dinner and metformin XR 500 mg twice daily (did not tolerate higher doses due to GI side effects). A/P: Chelsea [name] wore the CGM for 14 days. The average reading was 114, % time in target was 79, % time below target was 15, and % time above target was. 6. Intervention will be to decrease her PM Novolog 70/30 to 18 units with dinner, continue AM 22 units with breakfast. The patient will be scheduled to see [place] again in about 30-40 days for repeat CGM.

## 2017-08-08 NOTE — Telephone Encounter (Signed)
Losartan # 90 with 3 refills sent 07/04/2017 to [facility]. Patient notified. Hubbard Hartshorn, RN, BSN

## 2017-08-09 NOTE — Progress Notes (Signed)
Internal Medicine Clinic Attending  Case discussed with Dr. Patel at the time of the visit.  We reviewed the resident's history and exam and pertinent patient test results.  I agree with the assessment, diagnosis, and plan of care documented in the resident's note.  

## 2017-08-12 ENCOUNTER — Other Ambulatory Visit: Payer: Self-pay | Admitting: Internal Medicine

## 2017-08-12 DIAGNOSIS — F32A Depression, unspecified: Secondary | ICD-10-CM

## 2017-08-12 DIAGNOSIS — F329 Major depressive disorder, single episode, unspecified: Secondary | ICD-10-CM

## 2017-08-12 DIAGNOSIS — E1159 Type 2 diabetes mellitus with other circulatory complications: Secondary | ICD-10-CM

## 2017-08-12 DIAGNOSIS — I10 Essential (primary) hypertension: Secondary | ICD-10-CM

## 2017-08-12 DIAGNOSIS — I152 Hypertension secondary to endocrine disorders: Secondary | ICD-10-CM

## 2017-08-12 NOTE — Telephone Encounter (Signed)
Refilled 30 day Rx, patinet needs to be seen for follow up of hypertension within the month. Ideally with PCP.

## 2017-08-14 ENCOUNTER — Telehealth: Payer: Self-pay | Admitting: Internal Medicine

## 2017-08-14 NOTE — Telephone Encounter (Signed)
Patient is having a question about losartan took her off that medicine but she got another blood pressure medicine  pls call patient

## 2017-08-14 NOTE — Telephone Encounter (Signed)
Called patient back- she said the pharmacy is asking for her mew Medicare card before giving her any more test strips. I told her this is correct- she needs to take a copy to them so they can keep it on file.   She also was confused about her blood pressure medicine asking if she should be taking the losartan with the HCTZ. We reviewed what she should be taking several times and I was not sure she understood. However, she finally said she got it and would put the losartan with the HCTZ aside and not take it and take the one that was only losartan 100mg  daily. I suggested she be seen soon for an appointment and bring in all her medicine bottles. She agreed. Will ask front office to make her an appointment if Dr. Alfonse Spruce and attending agree.

## 2017-08-14 NOTE — Telephone Encounter (Signed)
Patient left a message asking for return call. She has questions about her medicine. Returned call- Unable to leave a message.

## 2017-08-15 NOTE — Telephone Encounter (Signed)
Reviewed & agree w plan Thanks DrG

## 2017-08-26 ENCOUNTER — Other Ambulatory Visit: Payer: Self-pay | Admitting: Internal Medicine

## 2017-08-26 DIAGNOSIS — I152 Hypertension secondary to endocrine disorders: Secondary | ICD-10-CM

## 2017-08-26 DIAGNOSIS — I10 Essential (primary) hypertension: Principal | ICD-10-CM

## 2017-08-26 DIAGNOSIS — E1159 Type 2 diabetes mellitus with other circulatory complications: Secondary | ICD-10-CM

## 2017-09-03 ENCOUNTER — Encounter: Payer: Self-pay | Admitting: *Deleted

## 2017-09-04 ENCOUNTER — Encounter: Payer: Self-pay | Admitting: Internal Medicine

## 2017-09-04 ENCOUNTER — Ambulatory Visit (INDEPENDENT_AMBULATORY_CARE_PROVIDER_SITE_OTHER): Payer: Medicare Other | Admitting: Internal Medicine

## 2017-09-04 ENCOUNTER — Other Ambulatory Visit: Payer: Self-pay

## 2017-09-04 ENCOUNTER — Ambulatory Visit (HOSPITAL_COMMUNITY)
Admission: RE | Admit: 2017-09-04 | Discharge: 2017-09-04 | Disposition: A | Discharge status: Home / Self Care | Payer: Medicare Other | Source: Ambulatory Visit | Attending: Family Medicine | Admitting: Family Medicine

## 2017-09-04 VITALS — BP 164/70 | HR 66 | Temp 97.8°F | Ht 63.0 in | Wt 241.2 lb

## 2017-09-04 DIAGNOSIS — I10 Essential (primary) hypertension: Secondary | ICD-10-CM | POA: Diagnosis not present

## 2017-09-04 DIAGNOSIS — I498 Other specified cardiac arrhythmias: Secondary | ICD-10-CM | POA: Diagnosis not present

## 2017-09-04 DIAGNOSIS — E11319 Type 2 diabetes mellitus with unspecified diabetic retinopathy without macular edema: Secondary | ICD-10-CM | POA: Diagnosis not present

## 2017-09-04 DIAGNOSIS — J45909 Unspecified asthma, uncomplicated: Secondary | ICD-10-CM | POA: Diagnosis not present

## 2017-09-04 DIAGNOSIS — N289 Disorder of kidney and ureter, unspecified: Secondary | ICD-10-CM | POA: Diagnosis not present

## 2017-09-04 DIAGNOSIS — Z794 Long term (current) use of insulin: Secondary | ICD-10-CM

## 2017-09-04 DIAGNOSIS — I152 Hypertension secondary to endocrine disorders: Secondary | ICD-10-CM

## 2017-09-04 DIAGNOSIS — E1159 Type 2 diabetes mellitus with other circulatory complications: Secondary | ICD-10-CM | POA: Diagnosis not present

## 2017-09-04 DIAGNOSIS — E785 Hyperlipidemia, unspecified: Secondary | ICD-10-CM | POA: Diagnosis not present

## 2017-09-04 DIAGNOSIS — M1990 Unspecified osteoarthritis, unspecified site: Secondary | ICD-10-CM

## 2017-09-04 DIAGNOSIS — N179 Acute kidney failure, unspecified: Secondary | ICD-10-CM

## 2017-09-04 DIAGNOSIS — R012 Other cardiac sounds: Secondary | ICD-10-CM | POA: Insufficient documentation

## 2017-09-04 DIAGNOSIS — E113593 Type 2 diabetes mellitus with proliferative diabetic retinopathy without macular edema, bilateral: Secondary | ICD-10-CM

## 2017-09-04 DIAGNOSIS — Z79899 Other long term (current) drug therapy: Secondary | ICD-10-CM | POA: Diagnosis not present

## 2017-09-04 DIAGNOSIS — I517 Cardiomegaly: Secondary | ICD-10-CM | POA: Diagnosis not present

## 2017-09-04 LAB — GLUCOSE, CAPILLARY: Glucose-Capillary: 102 mg/dL — ABNORMAL HIGH (ref 70–99)

## 2017-09-04 MED ORDER — HYDROCHLOROTHIAZIDE 25 MG PO TABS: 25.0000 mg | ORAL_TABLET | Freq: Every day | ORAL | 3 refills | Status: DC

## 2017-09-04 NOTE — Assessment & Plan Note (Addendum)
Assessment: Fixed split S2 loudest in the pulmonic region. Previously heard in May 2019 but not in prior exams.   Plan: - Ordered echocardiogram for further evaluation. - Obtained EKG today: Normal sinus rhythm.

## 2017-09-04 NOTE — Assessment & Plan Note (Signed)
Assessment: Last A1c was 8.6 on 07/03/2017.  She is was previously seen for a follow-up after 14 day monitoring with CGM.  Her insulin regiment currently: PM Novolog 70/30 to 18 units with dinner, AM 22 units with breakfast. She is also taking Metformin XR 500 mg twice daily. Today's CBG reading is 108.   Plan: - Accucheck monitor shows tight glycemic control with current medication regiment. Will continue current regiment and consider repeat CGM at 1 month follow-up.
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
## 2017-09-04 NOTE — Assessment & Plan Note (Addendum)
Assessment: Patient with persistently uncontrolled hypertension.  She was previously on a thiazide diuretic which was discontinued due to worsening renal function. She however continues to have declining creatinine levels (1.48 in May; up from 1.08 one year ago). Suggesting that her HTN is seriously affecting her kidney function and not merely the diuretic. Given her persistent hypertension, I will prescribe a thiazide. Secondary causes should also be considered such as renal parenchymal disease and renal artery stenosis.   Plan: - Ordered renal ultrasound - Will obtain 24-hour Sodium, Creatinine, and Sodium excretion levels. - Repeat BMP today - Discontinue use of hydralazine 10 mg given GI upset. - Start hydrochlorothiazide 25 mg daily. -Continue losartan 100 mg daily -Continue verapamil 180 mg daily -Continue DASH diet - Will consider referral to nephrologist for persistent HTN

## 2017-09-04 NOTE — Patient Instructions (Signed)
I have prescribed you hydrochlorothiazide 20 mg that you should start taking tomorrow. This will be one tablet every day along with your other blood pressure medications.  Please stop taking your hydralazine 10 mg tablets.

## 2017-09-05 DIAGNOSIS — I10 Essential (primary) hypertension: Secondary | ICD-10-CM | POA: Diagnosis not present

## 2017-09-05 DIAGNOSIS — E1159 Type 2 diabetes mellitus with other circulatory complications: Secondary | ICD-10-CM | POA: Diagnosis not present

## 2017-09-05 NOTE — Progress Notes (Signed)
Medicine attending: I personally interviewed and briefly examined this patient on the day of the patient visit and reviewed pertinent clinical ,laboratory, and radiographic data  with resident physician Dr. Nita Sickle and we discussed a management plan. Longstanding poorly controlled HTN & Insulin dependent type 2 DM. On 3 antihypertensives. Now w progressive renal insufficiency. Her diuretic was stopped recently when renal dysfunction felt to be pre-renal. I feel more likely this is progression of medical renal disease. I rec we re-institute diuretic. Get 24 hr urine total protein & Ccr; renal US; Nephrology referral. GFR currently 40. May need to stop ARB soon.

## 2017-09-06 ENCOUNTER — Other Ambulatory Visit (INDEPENDENT_AMBULATORY_CARE_PROVIDER_SITE_OTHER): Payer: Medicare Other

## 2017-09-06 DIAGNOSIS — I10 Essential (primary) hypertension: Secondary | ICD-10-CM

## 2017-09-06 DIAGNOSIS — E1159 Type 2 diabetes mellitus with other circulatory complications: Secondary | ICD-10-CM

## 2017-09-06 DIAGNOSIS — I152 Hypertension secondary to endocrine disorders: Secondary | ICD-10-CM

## 2017-09-07 LAB — SODIUM, URINE, 24 HOUR
SODIUM UR: 33 mmol/L
Sodium, 24H Ur: 43 mmol/24 hr (ref 39–258)

## 2017-09-07 LAB — BMP8+ANION GAP
Anion Gap: 18 mmol/L (ref 10.0–18.0)
BUN / CREAT RATIO: 16 (ref 12–28)
BUN: 22 mg/dL (ref 8–27)
CO2: 23 mmol/L (ref 20–29)
CREATININE: 1.35 mg/dL — AB (ref 0.57–1.00)
Calcium: 9 mg/dL (ref 8.7–10.3)
Chloride: 100 mmol/L (ref 96–106)
GFR, EST AFRICAN AMERICAN: 47 mL/min/{1.73_m2} — AB (ref >59)
GFR, EST NON AFRICAN AMERICAN: 40 mL/min/{1.73_m2} — AB (ref >59)
Glucose: 201 mg/dL — ABNORMAL HIGH (ref 65–99)
Potassium: 4.7 mmol/L (ref 3.5–5.2)
SODIUM: 141 mmol/L (ref 134–144)

## 2017-09-07 LAB — CREATININE CLEARANCE, URINE, 24 HOUR
CREATININE 24H UR: 611 mg/(24.h) — AB (ref 800–1800)
CREATININE: 1.32 mg/dL — AB (ref 0.57–1.00)
Creatinine Clearance: 32 mL/min — ABNORMAL LOW (ref 88–128)
Creatinine, Urine: 47 mg/dL
GFR, EST AFRICAN AMERICAN: 48 mL/min/{1.73_m2} — AB (ref >59)
GFR, EST NON AFRICAN AMERICAN: 41 mL/min/{1.73_m2} — AB (ref >59)

## 2017-09-07 LAB — PROTEIN, URINE, 24 HOUR
PROTEIN 24H UR: 2365 mg/(24.h) — AB (ref 30–150)
Protein, Ur: 181.9 mg/dL

## 2017-09-10 ENCOUNTER — Ambulatory Visit (HOSPITAL_COMMUNITY): Payer: Medicare Other | Attending: Internal Medicine

## 2017-10-04 ENCOUNTER — Ambulatory Visit (HOSPITAL_COMMUNITY): Payer: Medicare Other

## 2017-10-06 ENCOUNTER — Emergency Department (HOSPITAL_COMMUNITY): Payer: Medicare Other

## 2017-10-06 ENCOUNTER — Encounter (HOSPITAL_COMMUNITY): Payer: Self-pay

## 2017-10-06 ENCOUNTER — Inpatient Hospital Stay (HOSPITAL_COMMUNITY)
Admission: EM | Admit: 2017-10-06 | Discharge: 2017-10-09 | DRG: 065 | Disposition: A | Discharge status: Rehabilitation Facility | Payer: Medicare Other | Attending: Student in an Organized Health Care Education/Training Program | Admitting: Student in an Organized Health Care Education/Training Program

## 2017-10-06 DIAGNOSIS — I639 Cerebral infarction, unspecified: Secondary | ICD-10-CM | POA: Diagnosis not present

## 2017-10-06 DIAGNOSIS — Z880 Allergy status to penicillin: Secondary | ICD-10-CM

## 2017-10-06 DIAGNOSIS — I4891 Unspecified atrial fibrillation: Secondary | ICD-10-CM | POA: Diagnosis not present

## 2017-10-06 DIAGNOSIS — E876 Hypokalemia: Secondary | ICD-10-CM | POA: Diagnosis not present

## 2017-10-06 DIAGNOSIS — I6381 Other cerebral infarction due to occlusion or stenosis of small artery: Secondary | ICD-10-CM | POA: Diagnosis not present

## 2017-10-06 DIAGNOSIS — E669 Obesity, unspecified: Secondary | ICD-10-CM | POA: Diagnosis not present

## 2017-10-06 DIAGNOSIS — N179 Acute kidney failure, unspecified: Secondary | ICD-10-CM | POA: Diagnosis not present

## 2017-10-06 DIAGNOSIS — I152 Hypertension secondary to endocrine disorders: Secondary | ICD-10-CM | POA: Diagnosis present

## 2017-10-06 DIAGNOSIS — K5900 Constipation, unspecified: Secondary | ICD-10-CM | POA: Diagnosis present

## 2017-10-06 DIAGNOSIS — R531 Weakness: Secondary | ICD-10-CM | POA: Diagnosis not present

## 2017-10-06 DIAGNOSIS — E1169 Type 2 diabetes mellitus with other specified complication: Secondary | ICD-10-CM | POA: Diagnosis not present

## 2017-10-06 DIAGNOSIS — E1122 Type 2 diabetes mellitus with diabetic chronic kidney disease: Secondary | ICD-10-CM | POA: Diagnosis not present

## 2017-10-06 DIAGNOSIS — N189 Chronic kidney disease, unspecified: Secondary | ICD-10-CM | POA: Diagnosis not present

## 2017-10-06 DIAGNOSIS — E11319 Type 2 diabetes mellitus with unspecified diabetic retinopathy without macular edema: Secondary | ICD-10-CM | POA: Diagnosis present

## 2017-10-06 DIAGNOSIS — I6349 Cerebral infarction due to embolism of other cerebral artery: Secondary | ICD-10-CM | POA: Diagnosis not present

## 2017-10-06 DIAGNOSIS — K5901 Slow transit constipation: Secondary | ICD-10-CM | POA: Diagnosis not present

## 2017-10-06 DIAGNOSIS — G894 Chronic pain syndrome: Secondary | ICD-10-CM | POA: Diagnosis not present

## 2017-10-06 DIAGNOSIS — Z886 Allergy status to analgesic agent status: Secondary | ICD-10-CM | POA: Diagnosis not present

## 2017-10-06 DIAGNOSIS — Z7902 Long term (current) use of antithrombotics/antiplatelets: Secondary | ICD-10-CM | POA: Diagnosis not present

## 2017-10-06 DIAGNOSIS — E1165 Type 2 diabetes mellitus with hyperglycemia: Secondary | ICD-10-CM | POA: Diagnosis present

## 2017-10-06 DIAGNOSIS — Z794 Long term (current) use of insulin: Secondary | ICD-10-CM

## 2017-10-06 DIAGNOSIS — Z8673 Personal history of transient ischemic attack (TIA), and cerebral infarction without residual deficits: Secondary | ICD-10-CM | POA: Diagnosis present

## 2017-10-06 DIAGNOSIS — M8580 Other specified disorders of bone density and structure, unspecified site: Secondary | ICD-10-CM | POA: Diagnosis present

## 2017-10-06 DIAGNOSIS — Z833 Family history of diabetes mellitus: Secondary | ICD-10-CM | POA: Diagnosis not present

## 2017-10-06 DIAGNOSIS — Z6837 Body mass index (BMI) 37.0-37.9, adult: Secondary | ICD-10-CM | POA: Diagnosis not present

## 2017-10-06 DIAGNOSIS — D62 Acute posthemorrhagic anemia: Secondary | ICD-10-CM | POA: Diagnosis not present

## 2017-10-06 DIAGNOSIS — Z79899 Other long term (current) drug therapy: Secondary | ICD-10-CM | POA: Diagnosis not present

## 2017-10-06 DIAGNOSIS — E041 Nontoxic single thyroid nodule: Secondary | ICD-10-CM | POA: Diagnosis not present

## 2017-10-06 DIAGNOSIS — W19XXXA Unspecified fall, initial encounter: Secondary | ICD-10-CM | POA: Diagnosis present

## 2017-10-06 DIAGNOSIS — D72829 Elevated white blood cell count, unspecified: Secondary | ICD-10-CM | POA: Diagnosis not present

## 2017-10-06 DIAGNOSIS — Z7951 Long term (current) use of inhaled steroids: Secondary | ICD-10-CM | POA: Diagnosis not present

## 2017-10-06 DIAGNOSIS — S92502S Displaced unspecified fracture of left lesser toe(s), sequela: Secondary | ICD-10-CM | POA: Diagnosis not present

## 2017-10-06 DIAGNOSIS — I951 Orthostatic hypotension: Secondary | ICD-10-CM | POA: Diagnosis not present

## 2017-10-06 DIAGNOSIS — I6789 Other cerebrovascular disease: Secondary | ICD-10-CM | POA: Diagnosis not present

## 2017-10-06 DIAGNOSIS — R7989 Other specified abnormal findings of blood chemistry: Secondary | ICD-10-CM | POA: Diagnosis not present

## 2017-10-06 DIAGNOSIS — S0990XA Unspecified injury of head, initial encounter: Secondary | ICD-10-CM | POA: Diagnosis not present

## 2017-10-06 DIAGNOSIS — N183 Chronic kidney disease, stage 3 unspecified: Secondary | ICD-10-CM

## 2017-10-06 DIAGNOSIS — E1159 Type 2 diabetes mellitus with other circulatory complications: Secondary | ICD-10-CM | POA: Diagnosis not present

## 2017-10-06 DIAGNOSIS — G8194 Hemiplegia, unspecified affecting left nondominant side: Secondary | ICD-10-CM | POA: Diagnosis not present

## 2017-10-06 DIAGNOSIS — E113593 Type 2 diabetes mellitus with proliferative diabetic retinopathy without macular edema, bilateral: Secondary | ICD-10-CM | POA: Diagnosis not present

## 2017-10-06 DIAGNOSIS — I6389 Other cerebral infarction: Secondary | ICD-10-CM | POA: Diagnosis not present

## 2017-10-06 DIAGNOSIS — R0989 Other specified symptoms and signs involving the circulatory and respiratory systems: Secondary | ICD-10-CM | POA: Diagnosis not present

## 2017-10-06 DIAGNOSIS — R7309 Other abnormal glucose: Secondary | ICD-10-CM | POA: Diagnosis not present

## 2017-10-06 DIAGNOSIS — R110 Nausea: Secondary | ICD-10-CM | POA: Diagnosis not present

## 2017-10-06 DIAGNOSIS — E785 Hyperlipidemia, unspecified: Secondary | ICD-10-CM | POA: Diagnosis present

## 2017-10-06 DIAGNOSIS — F1011 Alcohol abuse, in remission: Secondary | ICD-10-CM | POA: Diagnosis present

## 2017-10-06 DIAGNOSIS — Z7982 Long term (current) use of aspirin: Secondary | ICD-10-CM | POA: Diagnosis not present

## 2017-10-06 DIAGNOSIS — F410 Panic disorder [episodic paroxysmal anxiety] without agoraphobia: Secondary | ICD-10-CM | POA: Diagnosis not present

## 2017-10-06 DIAGNOSIS — R471 Dysarthria and anarthria: Secondary | ICD-10-CM | POA: Diagnosis not present

## 2017-10-06 DIAGNOSIS — I10 Essential (primary) hypertension: Secondary | ICD-10-CM

## 2017-10-06 DIAGNOSIS — R29703 NIHSS score 3: Secondary | ICD-10-CM | POA: Diagnosis not present

## 2017-10-06 DIAGNOSIS — R112 Nausea with vomiting, unspecified: Secondary | ICD-10-CM | POA: Diagnosis not present

## 2017-10-06 DIAGNOSIS — M79672 Pain in left foot: Secondary | ICD-10-CM | POA: Diagnosis not present

## 2017-10-06 DIAGNOSIS — I6340 Cerebral infarction due to embolism of unspecified cerebral artery: Secondary | ICD-10-CM | POA: Diagnosis not present

## 2017-10-06 DIAGNOSIS — I129 Hypertensive chronic kidney disease with stage 1 through stage 4 chronic kidney disease, or unspecified chronic kidney disease: Secondary | ICD-10-CM | POA: Diagnosis present

## 2017-10-06 DIAGNOSIS — Z888 Allergy status to other drugs, medicaments and biological substances status: Secondary | ICD-10-CM

## 2017-10-06 DIAGNOSIS — J45909 Unspecified asthma, uncomplicated: Secondary | ICD-10-CM | POA: Diagnosis not present

## 2017-10-06 DIAGNOSIS — I499 Cardiac arrhythmia, unspecified: Secondary | ICD-10-CM | POA: Diagnosis not present

## 2017-10-06 DIAGNOSIS — K219 Gastro-esophageal reflux disease without esophagitis: Secondary | ICD-10-CM | POA: Diagnosis not present

## 2017-10-06 DIAGNOSIS — I69354 Hemiplegia and hemiparesis following cerebral infarction affecting left non-dominant side: Secondary | ICD-10-CM | POA: Diagnosis not present

## 2017-10-06 LAB — URINALYSIS, ROUTINE W REFLEX MICROSCOPIC
Bilirubin Urine: NEGATIVE
GLUCOSE, UA: 150 mg/dL — AB
KETONES UR: 5 mg/dL — AB
LEUKOCYTES UA: NEGATIVE
NITRITE: NEGATIVE
PH: 7 (ref 5.0–8.0)
Protein, ur: >=300 mg/dL — AB
Specific Gravity, Urine: 1.012 (ref 1.005–1.030)

## 2017-10-06 LAB — CBC WITH DIFFERENTIAL/PLATELET
Abs Immature Granulocytes: 0 10*3/uL (ref 0.0–0.1)
BASOS PCT: 0 %
Basophils Absolute: 0 10*3/uL (ref 0.0–0.1)
EOS ABS: 0 10*3/uL (ref 0.0–0.7)
EOS PCT: 0 %
HEMATOCRIT: 43.5 % (ref 36.0–46.0)
Hemoglobin: 13.3 g/dL (ref 12.0–15.0)
Immature Granulocytes: 1 %
LYMPHS ABS: 1 10*3/uL (ref 0.7–4.0)
Lymphocytes Relative: 13 %
MCH: 29.2 pg (ref 26.0–34.0)
MCHC: 30.6 g/dL (ref 30.0–36.0)
MCV: 95.6 fL (ref 78.0–100.0)
MONOS PCT: 6 %
Monocytes Absolute: 0.4 10*3/uL (ref 0.1–1.0)
Neutro Abs: 6.3 10*3/uL (ref 1.7–7.7)
Neutrophils Relative %: 80 %
PLATELETS: 243 10*3/uL (ref 150–400)
RBC: 4.55 MIL/uL (ref 3.87–5.11)
RDW: 12.3 % (ref 11.5–15.5)
WBC: 7.8 10*3/uL (ref 4.0–10.5)

## 2017-10-06 LAB — COMPREHENSIVE METABOLIC PANEL
ALT: 15 U/L (ref 0–44)
ANION GAP: 12 (ref 5–15)
AST: 17 U/L (ref 15–41)
Albumin: 3 g/dL — ABNORMAL LOW (ref 3.5–5.0)
Alkaline Phosphatase: 79 U/L (ref 38–126)
BILIRUBIN TOTAL: 0.6 mg/dL (ref 0.3–1.2)
BUN: 14 mg/dL (ref 8–23)
CO2: 26 mmol/L (ref 22–32)
Calcium: 8.2 mg/dL — ABNORMAL LOW (ref 8.9–10.3)
Chloride: 104 mmol/L (ref 98–111)
Creatinine, Ser: 1.22 mg/dL — ABNORMAL HIGH (ref 0.44–1.00)
GFR calc non Af Amer: 44 mL/min — ABNORMAL LOW (ref >60)
GFR, EST AFRICAN AMERICAN: 52 mL/min — AB (ref >60)
Glucose, Bld: 252 mg/dL — ABNORMAL HIGH (ref 70–99)
POTASSIUM: 3.4 mmol/L — AB (ref 3.5–5.1)
Sodium: 142 mmol/L (ref 135–145)
TOTAL PROTEIN: 6.7 g/dL (ref 6.5–8.1)

## 2017-10-06 LAB — I-STAT TROPONIN, ED: Troponin i, poc: 0.02 ng/mL (ref 0.00–0.08)

## 2017-10-06 LAB — LIPASE, BLOOD: Lipase: 38 U/L (ref 11–51)

## 2017-10-06 LAB — GLUCOSE, CAPILLARY: GLUCOSE-CAPILLARY: 247 mg/dL — AB (ref 70–99)

## 2017-10-06 MED ORDER — PROMETHAZINE HCL 25 MG/ML IJ SOLN
12.5000 mg | Freq: Four times a day (QID) | INTRAMUSCULAR | Status: DC | PRN
  Administered 2017-10-08: 12.5 mg via INTRAVENOUS
  Filled 2017-10-06: qty 1

## 2017-10-06 MED ORDER — ONDANSETRON 4 MG PO TBDP
4.0000 mg | ORAL_TABLET | Freq: Once | ORAL | Status: AC
  Administered 2017-10-06: 4 mg via ORAL
  Filled 2017-10-06: qty 1

## 2017-10-06 MED ORDER — ASPIRIN EC 81 MG PO TBEC: 81.0000 mg | DELAYED_RELEASE_TABLET | Freq: Every day | ORAL | Status: DC

## 2017-10-06 MED ORDER — SODIUM CHLORIDE 0.9 % IV BOLUS
1000.0000 mL | Freq: Once | INTRAVENOUS | Status: AC
  Administered 2017-10-06: 1000 mL via INTRAVENOUS

## 2017-10-06 MED ORDER — ASPIRIN EC 81 MG PO TBEC
81.0000 mg | DELAYED_RELEASE_TABLET | Freq: Every day | ORAL | Status: DC
  Administered 2017-10-07 – 2017-10-09 (×3): 81 mg via ORAL
  Filled 2017-10-06 (×3): qty 1

## 2017-10-06 MED ORDER — INSULIN ASPART 100 UNIT/ML ~~LOC~~ SOLN
0.0000 [IU] | Freq: Every day | SUBCUTANEOUS | Status: DC
  Administered 2017-10-06 – 2017-10-08 (×2): 2 [IU] via SUBCUTANEOUS

## 2017-10-06 MED ORDER — INSULIN ASPART 100 UNIT/ML ~~LOC~~ SOLN
0.0000 [IU] | Freq: Three times a day (TID) | SUBCUTANEOUS | Status: DC
  Administered 2017-10-07: 2 [IU] via SUBCUTANEOUS
  Administered 2017-10-07: 3 [IU] via SUBCUTANEOUS
  Administered 2017-10-07: 2 [IU] via SUBCUTANEOUS
  Administered 2017-10-08: 3 [IU] via SUBCUTANEOUS
  Administered 2017-10-08: 2 [IU] via SUBCUTANEOUS
  Administered 2017-10-08: 5 [IU] via SUBCUTANEOUS
  Administered 2017-10-09: 3 [IU] via SUBCUTANEOUS

## 2017-10-06 MED ORDER — ALBUTEROL SULFATE (2.5 MG/3ML) 0.083% IN NEBU: 2.5000 mg | INHALATION_SOLUTION | RESPIRATORY_TRACT | Status: DC | PRN

## 2017-10-06 MED ORDER — LOSARTAN POTASSIUM 50 MG PO TABS
100.0000 mg | ORAL_TABLET | Freq: Once | ORAL | Status: DC
  Filled 2017-10-06: qty 2

## 2017-10-06 MED ORDER — RAMELTEON 8 MG PO TABS
8.0000 mg | ORAL_TABLET | Freq: Every day | ORAL | Status: DC
  Administered 2017-10-06 – 2017-10-08 (×3): 8 mg via ORAL
  Filled 2017-10-06 (×4): qty 1

## 2017-10-06 MED ORDER — ASPIRIN EC 325 MG PO TBEC
325.0000 mg | DELAYED_RELEASE_TABLET | Freq: Once | ORAL | Status: AC
  Administered 2017-10-06: 325 mg via ORAL
  Filled 2017-10-06: qty 1

## 2017-10-06 MED ORDER — ENOXAPARIN SODIUM 40 MG/0.4ML ~~LOC~~ SOLN
40.0000 mg | SUBCUTANEOUS | Status: DC
  Administered 2017-10-06 – 2017-10-08 (×3): 40 mg via SUBCUTANEOUS
  Filled 2017-10-06 (×3): qty 0.4

## 2017-10-06 MED ORDER — ATORVASTATIN CALCIUM 80 MG PO TABS
80.0000 mg | ORAL_TABLET | Freq: Every day | ORAL | Status: DC
  Administered 2017-10-06 – 2017-10-09 (×4): 80 mg via ORAL
  Filled 2017-10-06: qty 8
  Filled 2017-10-06: qty 4
  Filled 2017-10-06: qty 8
  Filled 2017-10-06 (×2): qty 4
  Filled 2017-10-06: qty 8
  Filled 2017-10-06: qty 4

## 2017-10-06 MED ORDER — ONDANSETRON HCL 4 MG/2ML IJ SOLN
4.0000 mg | Freq: Once | INTRAMUSCULAR | Status: AC
  Administered 2017-10-06: 4 mg via INTRAVENOUS
  Filled 2017-10-06: qty 2

## 2017-10-06 MED ORDER — METOCLOPRAMIDE HCL 5 MG/ML IJ SOLN
10.0000 mg | Freq: Once | INTRAMUSCULAR | Status: AC
  Administered 2017-10-06: 10 mg via INTRAVENOUS
  Filled 2017-10-06: qty 2

## 2017-10-06 MED ORDER — MECLIZINE HCL 25 MG PO TABS
25.0000 mg | ORAL_TABLET | Freq: Once | ORAL | Status: AC
  Administered 2017-10-06: 25 mg via ORAL
  Filled 2017-10-06: qty 1

## 2017-10-06 MED ORDER — HYDRALAZINE HCL 20 MG/ML IJ SOLN
10.0000 mg | Freq: Once | INTRAMUSCULAR | Status: AC
  Administered 2017-10-06: 10 mg via INTRAVENOUS
  Filled 2017-10-06: qty 1

## 2017-10-06 NOTE — ED Notes (Signed)
Message sent to admitting for nausea medication and blood pressure medication

## 2017-10-06 NOTE — ED Triage Notes (Signed)
Pt presents for evaluation of generalized weakness, nausea and vomiting since dinner last night and a mechanical fall out of bed this morning with no injury. No blood thinners.
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
## 2017-10-06 NOTE — ED Notes (Signed)
EMS attempted IV x 1, this RN attempted IV access with no success.
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
## 2017-10-06 NOTE — Progress Notes (Signed)
Patient arrived to 3W29 at this time. Safety precautions and orders reviewed. TELE applied and confirm. B/P within CVA protocol range. Pt c/o nausea at this time. Comfort measure provided.     1930 pt verbalized that nausea are better after rest. Report given to oncoming RN at bedside.   Ave Filter, RN

## 2017-10-06 NOTE — ED Notes (Signed)
Patient transported to MRI 

## 2017-10-07 ENCOUNTER — Inpatient Hospital Stay (HOSPITAL_COMMUNITY): Payer: Medicare Other

## 2017-10-07 ENCOUNTER — Encounter (HOSPITAL_COMMUNITY): Payer: Self-pay | Admitting: Radiology

## 2017-10-07 DIAGNOSIS — E11319 Type 2 diabetes mellitus with unspecified diabetic retinopathy without macular edema: Secondary | ICD-10-CM

## 2017-10-07 DIAGNOSIS — I639 Cerebral infarction, unspecified: Secondary | ICD-10-CM

## 2017-10-07 DIAGNOSIS — I10 Essential (primary) hypertension: Secondary | ICD-10-CM

## 2017-10-07 DIAGNOSIS — E113593 Type 2 diabetes mellitus with proliferative diabetic retinopathy without macular edema, bilateral: Secondary | ICD-10-CM

## 2017-10-07 DIAGNOSIS — E785 Hyperlipidemia, unspecified: Secondary | ICD-10-CM

## 2017-10-07 DIAGNOSIS — N183 Chronic kidney disease, stage 3 unspecified: Secondary | ICD-10-CM

## 2017-10-07 DIAGNOSIS — M8580 Other specified disorders of bone density and structure, unspecified site: Secondary | ICD-10-CM

## 2017-10-07 DIAGNOSIS — I6340 Cerebral infarction due to embolism of unspecified cerebral artery: Principal | ICD-10-CM

## 2017-10-07 DIAGNOSIS — Z794 Long term (current) use of insulin: Secondary | ICD-10-CM

## 2017-10-07 DIAGNOSIS — I6789 Other cerebrovascular disease: Secondary | ICD-10-CM

## 2017-10-07 DIAGNOSIS — E1159 Type 2 diabetes mellitus with other circulatory complications: Secondary | ICD-10-CM

## 2017-10-07 DIAGNOSIS — E1169 Type 2 diabetes mellitus with other specified complication: Secondary | ICD-10-CM

## 2017-10-07 DIAGNOSIS — R112 Nausea with vomiting, unspecified: Secondary | ICD-10-CM

## 2017-10-07 DIAGNOSIS — E876 Hypokalemia: Secondary | ICD-10-CM

## 2017-10-07 LAB — BASIC METABOLIC PANEL
Anion gap: 7 (ref 5–15)
BUN: 17 mg/dL (ref 8–23)
CHLORIDE: 107 mmol/L (ref 98–111)
CO2: 28 mmol/L (ref 22–32)
CREATININE: 1.5 mg/dL — AB (ref 0.44–1.00)
Calcium: 7.7 mg/dL — ABNORMAL LOW (ref 8.9–10.3)
GFR calc Af Amer: 40 mL/min — ABNORMAL LOW (ref >60)
GFR calc non Af Amer: 35 mL/min — ABNORMAL LOW (ref >60)
GLUCOSE: 199 mg/dL — AB (ref 70–99)
Potassium: 3.4 mmol/L — ABNORMAL LOW (ref 3.5–5.1)
SODIUM: 142 mmol/L (ref 135–145)

## 2017-10-07 LAB — HEMOGLOBIN A1C
Hgb A1c MFr Bld: 7.6 % — ABNORMAL HIGH (ref 4.8–5.6)
Mean Plasma Glucose: 171.42 mg/dL

## 2017-10-07 LAB — GLUCOSE, CAPILLARY
GLUCOSE-CAPILLARY: 192 mg/dL — AB (ref 70–99)
GLUCOSE-CAPILLARY: 205 mg/dL — AB (ref 70–99)
GLUCOSE-CAPILLARY: 194 mg/dL — AB (ref 70–99)
GLUCOSE-CAPILLARY: 188 mg/dL — AB (ref 70–99)

## 2017-10-07 LAB — LIPID PANEL
CHOL/HDL RATIO: 4.8 ratio
CHOLESTEROL: 252 mg/dL — AB (ref 0–200)
HDL: 53 mg/dL (ref >40)
LDL Cholesterol: 177 mg/dL — ABNORMAL HIGH (ref 0–99)
Triglycerides: 109 mg/dL (ref <150)
VLDL: 22 mg/dL (ref 0–40)

## 2017-10-07 LAB — HIV ANTIBODY (ROUTINE TESTING W REFLEX): HIV SCREEN 4TH GENERATION: NONREACTIVE

## 2017-10-07 LAB — ECHOCARDIOGRAM COMPLETE
Height: 67 in
Weight: 3840 oz

## 2017-10-07 MED ORDER — IOPAMIDOL (ISOVUE-370) INJECTION 76%
50.0000 mL | Freq: Once | INTRAVENOUS | Status: AC | PRN
  Administered 2017-10-07: 50 mL via INTRAVENOUS

## 2017-10-07 MED ORDER — IOPAMIDOL (ISOVUE-370) INJECTION 76%
INTRAVENOUS | Status: AC
  Filled 2017-10-07: qty 50

## 2017-10-07 MED ORDER — CLOPIDOGREL BISULFATE 75 MG PO TABS
75.0000 mg | ORAL_TABLET | Freq: Every day | ORAL | Status: DC
  Administered 2017-10-07 – 2017-10-09 (×3): 75 mg via ORAL
  Filled 2017-10-07 (×3): qty 1

## 2017-10-07 MED ORDER — POTASSIUM CHLORIDE CRYS ER 20 MEQ PO TBCR
40.0000 meq | EXTENDED_RELEASE_TABLET | Freq: Once | ORAL | Status: DC
  Filled 2017-10-07 (×2): qty 2

## 2017-10-07 NOTE — Progress Notes (Signed)
  Echocardiogram 2D Echocardiogram has been performed.  Jennette Dubin 10/07/2017, 2:46 PM

## 2017-10-07 NOTE — Progress Notes (Signed)
   Subjective: Feeling better this morning. Nausea was better overnight but got worse this morning with breakfast. Denies any new weakness  Objective:  Vital signs in last 24 hours: Vitals:   10/06/17 2344 10/07/17 0441 10/07/17 0855 10/07/17 1200  BP: 108/73 (!) 155/53 (!) 180/75 (!) 192/77  Pulse: 98 83 85 70  Resp: 18 16 18 20   Temp: 98.9 F (37.2 C) 98.8 F (37.1 C) 97.7 F (36.5 C)   TempSrc: Oral Oral Oral   SpO2: 100% 100% 97% 100%  Weight:      Height:       Gen: Ill appearing, NAD, leaning towards her left side in bed CV: RRR, no murmurs Neuro: sensation intact and symmetric bilaterally on upper extremities, 4/5 strength with left arm extension. 5/5 strength with straight arm raise and flexion of the left arm. Right arm 5/5 strength.    Assessment/Plan:  Principal Problem:   Stroke [facility]) Active Problems:   Type 2 diabetes mellitus with diabetic retinopathy ([place])   Hyperlipidemia associated with type 2 diabetes mellitus ([place])   Hypertension associated with diabetes ([place])   Ms. Herzig is a 89yoF with diabetes, osteopenia, HTN, HLD who presents for evaluation of a one week history of weakness, falls, and one day of n/v and brain MRI revealed multiple areas of infarct.   Stroke: Appreciate neurology's assistance. Continue baby aspirin, statin, plavix. Permissive hypertension for now. No new neurologic deficits this morning. Will work with PT/OT today. Speech recommending regular diet with thin liquid. MRI findings concerning for embolic stroke. Will follow up CTA head and neck, lower extremity ultrasound, and echo.   Nausea/vomiting: Improved overnight with bowel rest but worsened with po intake this morning. Continue prn promethazine  Diabetes: SSI novolog. a1c in May was 8.6. Repeat during admission was 7.6  Dispo: Anticipated discharge in approximately 1-2 day(s).   Isabelle Course, MD 10/07/2017, 12:37 PM Pager: [phone]

## 2017-10-07 NOTE — Progress Notes (Signed)
    [facility] has been requested to perform a transesophageal echocardiogram on 10/09/17 for Stroke.  After careful review of history and examination, the risks and benefits of transesophageal echocardiogram have been explained including risks of esophageal damage, perforation (1:10,000 risk), bleeding, pharyngeal hematoma as well as other potential complications associated with conscious sedation including aspiration, arrhythmia, respiratory failure and death. Alternatives to treatment were discussed, questions were answered. Patient is willing to proceed. Labs stable. She remains hypertensive.   Reino Bellis, NP-C 10/07/2017 3:42 PM

## 2017-10-07 NOTE — Progress Notes (Signed)
Internal Medicine Attending:   I saw and examined the patient. I reviewed the resident's note and I agree with the resident's findings and plan as documented in the resident's note.  Hospital day #2 with acute multifocal CVAs, most likely small vessel disease but there is risk that this could have been a cardioembolic source.  His blood pressures are labile, we are allowing for permissive hypertension.  Stroke evaluation ongoing and likely to include TEE and implantable recorder.  The patient is doing well with some improvement in her deficits, still having nausea and lightheadedness with position changes likely related to the posterior circulation CVA.  PT recommending inpatient rehabilitation, which I think is appropriate.

## 2017-10-07 NOTE — Consult Note (Signed)
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 documentation above.   Delice Lesch, MD, ABPMR Bary Leriche, PA-C 10/07/2017

## 2017-10-07 NOTE — Progress Notes (Signed)
Rehab Admissions Coordinator Note:  Patient was screened by Cleatrice Burke for appropriateness for an Inpatient Acute Rehab Consult per PT recommendation.   At this time, we are recommending Inpatient Rehab consult.  Cleatrice Burke 10/07/2017, 1:55 PM  I can be reached at [phone].

## 2017-10-07 NOTE — Evaluation (Signed)
Occupational Therapy Evaluation Patient Details Name: Chelsea [name] MRN: [id number] DOB: November 16, 1950 Today's Date: 10/07/2017    History of Present Illness RUPA LAGAN is an 67 y.o. female with PMH of HTN, HLD, DM2, withdrawal seizures presented to [facility] for evaluation of weakness and falling. MRI revealed 4 small acute/subacute strokes in separate vascular territories (middle cerebral peduncle, external capsule, L spenium of External caplsue, and L parietal white matter.)   Clinical Impression   Pt presents to OT with deficits impacting her ability to complete ADLs and PLOF.  Pt demonstrate decreased awareness of deficits, BUE ataxia Rt > Lt, decreased sitting balance impacting ability to complete ADLs.  Pt limited participation during eval due to fatigue from multiple therapy sessions and procedures.  Pt will benefit from skilled OT acutely to address below deficits and prepare for additional rehab at CIR level prior to d/c home with family assist.    Follow Up Recommendations  CIR    Equipment Recommendations  (defer to next venue of care)    Recommendations for Other Services Rehab consult     Precautions / Restrictions Precautions Precautions: Fall Precaution Comments: R lateral lean      Mobility Bed Mobility Overal bed mobility: Needs Assistance Bed Mobility: Sit to Supine       Sit to supine: Mod assist   General bed mobility comments: sitting EOB upon my entry leaning to R; assist for both legs into bed to supine; +2 A to scoot to [facility]  Transfers Overall transfer level: Needs assistance Equipment used: Rolling walker (2 wheeled) Transfers: Sit to/from Stand Sit to Stand: Mod assist;+2 physical assistance              Balance Overall balance assessment: Needs assistance   Sitting balance-Leahy Scale: Poor Sitting balance - Comments: leaning to R needing UE support and S   Standing balance support: Bilateral upper extremity supported Standing  balance-Leahy Scale: Poor Standing balance comment:  assist to prevent R LOB, with Bilat UE support                           ADL either performed or assessed with clinical judgement   ADL Overall ADL's : Needs assistance/impaired                                       General ADL Comments: Pt with limited participation in eval due to fatigue.  However pt reports she required assistance to don socks in sitting this AM.     Vision Baseline Vision/History: No visual deficits(had lasix) Patient Visual Report: No change from baseline Vision Assessment?: No apparent visual deficits            Pertinent Vitals/Pain Pain Assessment: No/denies pain     Hand Dominance Left   Extremity/Trunk Assessment Upper Extremity Assessment Upper Extremity Assessment: (B ataxia, Rt > Lt)   Lower Extremity Assessment Lower Extremity Assessment: Generalized weakness   Cervical / Trunk Assessment Cervical / Trunk Assessment: Other exceptions Cervical / Trunk Exceptions: R trunk collapse in sitting   Communication Communication Communication: No difficulties   Cognition Arousal/Alertness: Awake/alert Behavior During Therapy: WFL for tasks assessed/performed Overall Cognitive Status: Impaired/Different from baseline Area of Impairment: Safety/judgement;Attention                   Current Attention Level: Sustained     Safety/Judgement:  Decreased awareness of safety;Decreased awareness of deficits         General Comments  c/o dizziness with mobility sitting and standing.             Home Living Family/patient expects to be discharged to:: Private residence Living Arrangements: Children(son) Available Help at Discharge: Family;Available PRN/intermittently Type of Home: House Home Access: Stairs to enter [employer] of Steps: 3 Entrance Stairs-Rails: Right Home Layout: One level     Bathroom Shower/Tub: Tub/shower unit          Home Equipment: Environmental consultant - 4 wheels          Prior Functioning/Environment Level of Independence: Independent with assistive device(s)        Comments: normally performs BADL's on her own        OT Problem List: Decreased strength;Decreased activity tolerance;Impaired balance (sitting and/or standing);Decreased coordination;Decreased safety awareness;Impaired UE functional use      OT Treatment/Interventions: Self-care/ADL training;Neuromuscular education;DME and/or AE instruction;Patient/family education;Balance training    OT Goals(Current goals can be found in the care plan section) Acute Rehab OT Goals Patient Stated Goal: agreeable to rehab OT Goal Formulation: With patient/family Time For Goal Achievement: 10/21/17 Potential to Achieve Goals: Good  OT Frequency: Min 2X/week    AM-PAC PT "6 Clicks" Daily Activity     Outcome Measure Help from another person eating meals?: None Help from another person taking care of personal grooming?: A Little Help from another person toileting, which includes using toliet, bedpan, or urinal?: A Lot Help from another person bathing (including washing, rinsing, drying)?: A Little Help from another person to put on and taking off regular upper body clothing?: A Little Help from another person to put on and taking off regular lower body clothing?: A Lot 6 Click Score: 17   End of Session    Activity Tolerance: Patient limited by fatigue Patient left: in bed;with family/visitor present;with bed alarm set  OT Visit Diagnosis: Unsteadiness on feet (R26.81);Muscle weakness (generalized) (M62.81);Other symptoms and signs involving the nervous system (R29.898);Hemiplegia and hemiparesis Hemiplegia - Right/Left: Right Hemiplegia - dominant/non-dominant: Non-Dominant                Time: 2505-3976 OT Time Calculation (min): 17 min Charges:  OT General Charges $OT Visit: 1 Visit OT Evaluation $OT Eval Moderate Complexity: [place], [place], [phone] 10/07/2017, 4:08 PM

## 2017-10-07 NOTE — Progress Notes (Signed)
Inpatient Diabetes Program Recommendations  AACE/ADA: New Consensus Statement on Inpatient Glycemic Control (2015)  Target Ranges:  Prepandial:   less than 140 mg/dL      Peak postprandial:   less than 180 mg/dL (1-2 hours)      Critically ill patients:  140 - 180 mg/dL   Lab Results  Component Value Date   GLUCAP 205 (H) 10/07/2017   HGBA1C 7.6 (H) 10/07/2017    Review of Glycemic Control Results for Chelsea [name], Chelsea [name] (MRN [id number]) as of 10/07/2017 15:39  Ref. Range 10/06/2017 22:22 10/07/2017 06:50 10/07/2017 11:55  Glucose-Capillary Latest Ref Range: 70 - 99 mg/dL 247 (H) 192 (H) 205 (H)   Diabetes history: DM2 Outpatient Diabetes medications: Novolog 70/30 insulin 22 units am + 18 units pm Current orders for Inpatient glycemic control: Novolog sensitive correction tid + hs 0=5 units  Inpatient Diabetes Program Recommendations:   -Lantus 14 units daily (approx. 50% home basal dose) -Change to carb modified diet if eating well -Novolog 2-3 units tid meal coverage if eats 50%  Thank you, Bethena Roys E. Moxon Messler, RN, MSN, CDE  Diabetes Coordinator Inpatient Glycemic Control Team Team Pager [phone] (8am-5pm) 10/07/2017 3:41 PM
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
## 2017-10-07 NOTE — Progress Notes (Signed)
Inpatient Rehabilitation Admissions Coordinator  I will follow up with patient and daughter to assist with planning dispo once medical workup is complete.  Danne Baxter, RN, MSN Rehab Admissions Coordinator [phone] 10/07/2017 3:48 PM

## 2017-10-07 NOTE — Progress Notes (Signed)
*  Preliminary Results* Bilateral lower extremity venous duplex completed. Bilateral lower extremities are negative for deep vein thrombosis. There is no evidence of Baker's cyst bilaterally.  10/07/2017 2:30 PM Naida Escalante Dawna Part

## 2017-10-07 NOTE — Evaluation (Signed)
Physical Therapy Evaluation Patient Details Name: Chelsea [name] MRN: [id number] DOB: 06-26-1950 Today's Date: 10/07/2017   History of Present Illness  Chelsea [name] is an 67 y.o. female with PMH of HTN, HLD, DM2, withdrawal seizures presented to [facility] for evaluation of weakness and falling. MRI revealed 4 small acute/subacute strokes in separate vascular territories (middle cerebral peduncle, external capsule, L spenium of External caplsue, and L parietal white matter.)  Clinical Impression  Patient presents with decreased independence and safety with mobility currently requiring mod to max A for transfers and short distance ambulation due to poor deficit awareness, decreased balance, decreased perception, decreased strength and decreased activity tolerance.  She will benefit from skilled PT in the acute setting to allow return home with family support following CIR level rehab stay.     Follow Up Recommendations CIR;Supervision/Assistance - 24 hour    Equipment Recommendations  None recommended by PT    Recommendations for Other Services Rehab consult     Precautions / Restrictions Precautions Precautions: Fall Precaution Comments: R lateral lean      Mobility  Bed Mobility Overal bed mobility: Needs Assistance Bed Mobility: Sit to Supine       Sit to supine: Mod assist   General bed mobility comments: sitting EOB upon my entry leaning to R; assist for both legs into bed to supine; +2 A to scoot to [facility]  Transfers Overall transfer level: Needs assistance Equipment used: Rolling walker (2 wheeled) Transfers: Sit to/from Stand Sit to Stand: Mod assist;+2 physical assistance            Ambulation/Gait Ambulation/Gait assistance: Mod assist;Max assist Gait Distance (Feet): 2 Feet Assistive device: Rolling walker (2 wheeled) Gait Pattern/deviations: Step-to pattern;Decreased stride length     General Gait Details: assist for balance while side stepping to [facility] with  LOB to R each time stepping with L leg  Stairs            Wheelchair Mobility    Modified Rankin (Stroke Patients Only) Modified Rankin (Stroke Patients Only) Pre-Morbid Rankin Score: Moderate disability Modified Rankin: Moderately severe disability     Balance Overall balance assessment: Needs assistance   Sitting balance-Leahy Scale: Poor Sitting balance - Comments: leaning to R needing UE support and S   Standing balance support: Bilateral upper extremity supported Standing balance-Leahy Scale: Poor Standing balance comment:  assist to prevent R LOB, with Bilat UE support                             Pertinent Vitals/Pain Pain Assessment: No/denies pain    Home Living Family/patient expects to be discharged to:: Private residence Living Arrangements: Children(son) Available Help at Discharge: Family;Available PRN/intermittently Type of Home: House Home Access: Stairs to enter Entrance Stairs-Rails: Right Entrance Stairs-Number of Steps: 3 Home Layout: One level Home Equipment: Walker - 4 wheels      Prior Function Level of Independence: Independent with assistive device(s)         Comments: normally performs BADL's on her own     Hand Dominance        Extremity/Trunk Assessment   Upper Extremity Assessment Upper Extremity Assessment: Generalized weakness    Lower Extremity Assessment Lower Extremity Assessment: Generalized weakness    Cervical / Trunk Assessment Cervical / Trunk Assessment: Other exceptions Cervical / Trunk Exceptions: R trunk collapse in sitting  Communication   Communication: No difficulties  Cognition Arousal/Alertness: Awake/alert Behavior During Therapy: [facility]  for tasks assessed/performed Overall Cognitive Status: Impaired/Different from baseline Area of Impairment: Safety/judgement;Attention                   Current Attention Level: Sustained     Safety/Judgement: Decreased awareness of  safety;Decreased awareness of deficits            General Comments General comments (skin integrity, edema, etc.): c/o dizziness with mobility sitting and standing.     Exercises     Assessment/Plan    PT Assessment Patient needs continued PT services  PT Problem List Decreased strength;Decreased mobility;Decreased balance;Decreased knowledge of use of DME;Impaired sensation;Decreased activity tolerance;Decreased knowledge of precautions;Decreased safety awareness       PT Treatment Interventions DME instruction;Therapeutic activities;Gait training;Therapeutic exercise;Patient/family education;Balance training;Functional mobility training;Stair training    PT Goals (Current goals can be found in the Care Plan section)  Acute Rehab PT Goals Patient Stated Goal: agreeable to rehab PT Goal Formulation: With patient/family Time For Goal Achievement: 10/21/17 Potential to Achieve Goals: Good    Frequency Min 4X/week   Barriers to discharge        Co-evaluation               AM-PAC PT "6 Clicks" Daily Activity  Outcome Measure Difficulty turning over in bed (including adjusting bedclothes, sheets and blankets)?: A Lot Difficulty moving from lying on back to sitting on the side of the bed? : Unable Difficulty sitting down on and standing up from a chair with arms (e.g., wheelchair, bedside commode, etc,.)?: Unable Help needed moving to and from a bed to chair (including a wheelchair)?: A Lot Help needed walking in hospital room?: A Lot Help needed climbing 3-5 steps with a railing? : Total 6 Click Score: 9    End of Session Equipment Utilized During Treatment: Gait belt Activity Tolerance: Patient limited by fatigue Patient left: in bed;with call bell/phone within reach;with bed alarm set;with family/visitor present   PT Visit Diagnosis: Other abnormalities of gait and mobility (R26.89);Other symptoms and signs involving the nervous system (R29.898);Dizziness and  giddiness (R42)    Time: 4801-6553 PT Time Calculation (min) (ACUTE ONLY): 17 min   Charges:   PT Evaluation $PT Eval Moderate Complexity: [place], [place] [phone] 10/07/2017   Reginia Naas 10/07/2017, 1:55 PM

## 2017-10-07 NOTE — Progress Notes (Addendum)
STROKE TEAM PROGRESS NOTE   INTERVAL HISTORY Her daughter is at the bedside.  Physical therapy just work with her and she was saddened to find out that she would be recommended for inpatient rehab.  Overall, she continues to improve from yesterday.  Her headache is resolved as is her drooling.  She no longer has blurred vision.  She still has left hemiparesis which led to recommendation for rehab.  Vitals:   10/06/17 2022 10/06/17 2344 10/07/17 0441 10/07/17 0855  BP: (!) 146/57 108/73 (!) 155/53 (!) 180/75  Pulse: 86 98 83 85  Resp: 18 18 16 18   Temp: 98.3 F (36.8 C) 98.9 F (37.2 C) 98.8 F (37.1 C) 97.7 F (36.5 C)  TempSrc: Oral Oral Oral Oral  SpO2: 99% 100% 100% 97%  Weight:      Height:        CBC:  Recent Labs  Lab 10/06/17 0751  WBC 7.8  NEUTROABS 6.3  HGB 13.3  HCT 43.5  MCV 95.6  PLT 175    Basic Metabolic Panel:  Recent Labs  Lab 10/06/17 0751 10/07/17 0507  NA 142 142  K 3.4* 3.4*  CL 104 107  CO2 26 28  GLUCOSE 252* 199*  BUN 14 17  CREATININE 1.22* 1.50*  CALCIUM 8.2* 7.7*   Lipid Panel:     Component Value Date/Time   CHOL 252 (H) 10/07/2017 0507   TRIG 109 10/07/2017 0507   HDL 53 10/07/2017 0507   CHOLHDL 4.8 10/07/2017 0507   VLDL 22 10/07/2017 0507   LDLCALC 177 (H) 10/07/2017 0507   HgbA1c:  Lab Results  Component Value Date   HGBA1C 7.6 (H) 10/07/2017   Urine Drug Screen:     Component Value Date/Time   LABOPIA NEG 10/25/2010 1348   LABOPIA NEG 09/01/2010 1614   COCAINSCRNUR NEG 10/25/2010 1348   LABBENZ NEG 10/25/2010 1348   AMPHETMU NEG 10/25/2010 1348    Alcohol Level No results found for: ETH  IMAGING Ct Head Wo Contrast  Result Date: 10/06/2017 CLINICAL DATA:  Dizziness starting this morning.  Fall this morning. EXAM: CT HEAD WITHOUT CONTRAST TECHNIQUE: Contiguous axial images were obtained from the base of the skull through the vertex without intravenous contrast. COMPARISON:  Report from 11/04/2002 FINDINGS:  Brain: Small remote lacunar infarcts peripherally in the right cerebellum and in the right periventricular white matter. Small lacunar infarct versus dilated perivascular space in the left lentiform nucleus on image 15/3. Small chronic lacunar infarct in the left thalamus, image 16/3. Periventricular white matter and corona radiata hypodensities favor chronic ischemic microvascular white matter disease. No intracranial hemorrhage, mass lesion, or acute CVA. Vascular: There is atherosclerotic calcification of the cavernous carotid arteries bilaterally. Skull: Unremarkable Sinuses/Orbits: Mucous retention cyst in left maxillary sinus. Other: No supplemental non-categorized findings. IMPRESSION: 1. No acute intracranial findings. 2. Periventricular white matter and corona radiata hypodensities favor chronic ischemic microvascular white matter disease. 3. Suspected small remote lacunar infarcts in the right cerebellum, right periventricular white matter, left thalamus, and left lentiform nucleus. 4. Mucous retention cyst in left maxillary sinus. Electronically Signed   By: Van Clines M.D.   On: 10/06/2017 11:01   Mr Brain Wo Contrast  Result Date: 10/06/2017 CLINICAL DATA:  Generalized weakness. Nausea and vomiting beginning last night. Fell from bed this morning. EXAM: MRI HEAD WITHOUT CONTRAST TECHNIQUE: Multiplanar, multiecho pulse sequences of the brain and surrounding structures were obtained without intravenous contrast. COMPARISON:  Head CT same day FINDINGS: Brain: Diffusion  imaging shows a 3-4 mm acute infarction the middle cerebellar peduncle on right. There is a subcentimeter acute infarction in the right external capsule/radiating white matter tracts. There is a 3 mm acute infarction in the left splenium of the corpus callosum. There is a punctate acute infarction in the left parietal white matter. No acute large vessel territory infarction. Elsewhere, there chronic small vessel infarctions  affecting pons, thalami, basal ganglia and hemispheric white matter. No large vessel territory infarction. Few of the old infarctions are associated with hemosiderin deposition. No mass lesion, acute hemorrhage, hydrocephalus or extra-axial collection. Vascular: Major vessels at the base of the brain show flow. Skull and upper cervical spine: Negative Sinuses/Orbits: Clear/normal Other: None IMPRESSION: Background pattern of chronic small vessel ischemic changes throughout brain. Four acute/subacute small vessel infarctions, 1 the right middle cerebellar peduncle, 1 within the right external capsule/radiating white matter tracts 1 within the left splenium the corpus callosum 1 the left parietal white matter. Electronically Signed   By: Nelson Chimes M.D.   On: 10/06/2017 14:26   2D echocardiogram - Left ventricle: The cavity size was normal. Wall thickness was increased in a pattern of mild LVH. Systolic function was normal. The estimated ejection fraction was in the range of 60% to 65%. Wall motion was normal; there were no regional wall motion abnormalities. The study is not technically sufficient to allow evaluation of LV diastolic function. - Left atrium: The atrium was mildly dilated. - Inferior vena cava: The vessel was normal in size. The respirophasic diameter changes were in the normal range (>= 50%), consistent with normal central venous pressure. Impressions:  LVEF 60-65%, mild LVH, normal wall motion, mild LAE, normal IVC.  Lower extremity Dopplers negative for deep vein thrombosis. There is no evidence of Baker's cyst bilaterally.   PHYSICAL EXAM HEENT-  Normocephalic, no lesions, without obvious abnormality.  Normal external eye and conjunctiva. Cardiovascular- S1-S2 audible, pulses palpable throughout  Lungs-no rhonchi or wheezing noted, no excessive working breathing.  Saturations within normal limits on RA Extremities- Warm, dry and intact.  Skin-warm and dry  Neurological  Examination Mental Status: Alert, oriented to person/age/[place]/ year/ city/state, thought content appropriate.  Speech is slow/delayed but fluent without errors of syntax or grammar. Able to name objects and able to follow all commands without difficulty. Slight dysarthria noted. Cranial Nerves: II:  Visual fields grossly normal;  III,IV, VI: ptosis not present, extra-ocular motions intact bilaterally. Pupils equal, round, reactive to light and accommodation. Eyes appear skewed at rest. Denies double vision. V,VII: smile with mild right sided droop, facial light touch sensation normal bilaterally VIII: hearing intact to voice IX,X: Mild hypophonia XI: No asymmetry XII: Midline tongue extension Motor: Right :  Upper extremity   4/5                                      Left:     Upper extremity   5/5             Lower extremity   4/5                                                  Lower extremity   5/5 Right side is weaker than left side Tone and bulk:normal tone  throughout; no atrophy noted Sensory: Light touch intact throughout, bilaterally Deep Tendon Reflexes: 2+ and symmetric biceps and patellae Cerebellar: Prominent ataxia with right FNF, mild ataxia with left FNF. Dysmetric RLE Gait: deferred  ASSESSMENT/PLAN Ms. AMANDAJO GONDER is a 67 y.o. female with history of HTN, HLD, DB, obesity, etoh abuse w/ withdrawal sz, presenting with HA, blurry vision, light headedness, L HP and 3 falls since Friday (waslks with walker or can at home, no hx falls previously).   Stroke:   Multiple bilateral infarcts appear embolic secondary to unknown source, suspicious for atrial fibrillation   CT head No acute stroke. Small vessel disease. Atrophy. Suspected small old lacunes R cerebellum, R PVWM, L thalamus, L LN. L maxillary sinus mucous retention cyst  MRI  Small vessel disease. Acute infarcts:  R middle cerebellar peduncle, R external capsule, L splenium/corpus callosum, L parietal WM  CTA  head & neck no ELVO.  Minimal right carotid atherosclerosis.  Right thyroid nodule (nonemergent follow-up recommended). Small vessel disease.   2D Echo  EF 60-65%. No source of embolus   LE doppler negative  TEE to look for embolic source. Arranged with [place] for tomorrow.  (I have made patient NPO after midnight tonight).  If TEE negative, a [place] electrophysiologist will consult and consider placement of an implantable loop recorder to evaluate for atrial fibrillation as etiology of stroke. This has been explained to patient/family by Dr. Erlinda Hong and they are agreeable.   LDL 177  HgbA1c 7.6  Lovenox 40 mg sq daily for VTE prophylaxis  No antithrombotic prior to admission (stopped by PCP some time ago, treated with aspirin 325 in ED, now on aspirin 81 mg daily and clopidogrel 75 mg daily.  Continue dual antiplatelets x3 weeks then aspirin alone.  Therapy recommendations:  CIR. Consult in place  Disposition:  pending   Hypertension  Stable on the higher side . Permissive hypertension (OK if < 220/120) but gradually normalize in 5-7 days . Long-term BP goal normotensive  Hyperlipidemia  Home meds:  lipitor 80, resumed in hospital  LDL 177, goal < 70  Continue statin at discharge  Diabetes type II  HgbA1c 7.6, goal < 7.0  Uncontrolled  DB RN following with recommendations ( -Lantus 14 units daily (approx. 50% home basal dose), -Change to carb modified diet if eating well, -Novolog 2-3 units tid meal coverage if eats 50%)  Other Stroke Risk Factors  Advanced age  Remote hx ETOH abuse w/ withdrawal sz. Stopped drinking beer 1 mo ago  UDS / ETOH level not performed   Obesity, Body mass index is 37.59 kg/m., recommend weight loss, diet and exercise as appropriate   Other Active Problems  Nausea and vomiting likely secondary to cerebellar infarct  Hospital day # [place], MSN, APRN, ANVP-BC,  AGPCNP-BC Advanced Practice Stroke Nurse [place] for Schedule & Pager information 10/07/2017 4:25 PM   ATTENDING NOTE: I reviewed above note and agree with the assessment and plan. I have made any additions or clarifications directly to the above note. Pt was seen and examined.   67 year old female with history of alcohol abuse, hypertension, hyperlipidemia, obesity and diabetes admitted for not feeling well, drooling, blurry vision, headache with left-sided weakness and falls at home.  CT no acute abnormality, but remote right cerebellum, left BG, right subcortical white matter, and left thalamus infarcts.  MRI showed right CR, left splenium, right cerebellar peduncle  acute infarcts.  EF 60 to 65%.  Bilateral LE venous Doppler negative for DVT.  CT head and neck showed bilateral siphon and left ICA bifurcation atherosclerosis.  A1c 7.6 and LDL 177.  BP on the high side.  On examination, patient awake alert orientated x3, no significant facial droop, tongue midline.  Left upper extremity 4/5 with ataxia but proportional to the weakness.  Right upper extremity 5/5 but ataxia on finger-to-nose exam.  Bilateral lower extremity 5/5.  Patient stroke embolic pattern, source unknown.  However, patient does have multiple stroke risk factors including uncontrolled hypertension, hyperlipidemia, diabetes and obesity.  Recommend TEE/loop recorder for further cardioembolic evaluation.  Continue aspirin 81 and Plavix 75 DAPT for 3 weeks and then aspirin alone.  New high-dose Lipitor.  Will follow.  Rosalin Hawking, MD PhD Stroke Neurology 10/07/2017 4:40 PM   To contact Stroke Continuity provider, please refer to [email or web address] After hours, contact General Neurology

## 2017-10-08 DIAGNOSIS — M79672 Pain in left foot: Secondary | ICD-10-CM

## 2017-10-08 LAB — BASIC METABOLIC PANEL
Anion gap: 9 (ref 5–15)
BUN: 14 mg/dL (ref 8–23)
CALCIUM: 7.9 mg/dL — AB (ref 8.9–10.3)
CO2: 26 mmol/L (ref 22–32)
Chloride: 107 mmol/L (ref 98–111)
Creatinine, Ser: 1.24 mg/dL — ABNORMAL HIGH (ref 0.44–1.00)
GFR calc Af Amer: 51 mL/min — ABNORMAL LOW (ref >60)
GFR calc non Af Amer: 44 mL/min — ABNORMAL LOW (ref >60)
GLUCOSE: 196 mg/dL — AB (ref 70–99)
Potassium: 3.8 mmol/L (ref 3.5–5.1)
Sodium: 142 mmol/L (ref 135–145)

## 2017-10-08 LAB — GLUCOSE, CAPILLARY
GLUCOSE-CAPILLARY: 226 mg/dL — AB (ref 70–99)
GLUCOSE-CAPILLARY: 244 mg/dL — AB (ref 70–99)
Glucose-Capillary: 176 mg/dL — ABNORMAL HIGH (ref 70–99)
Glucose-Capillary: 269 mg/dL — ABNORMAL HIGH (ref 70–99)

## 2017-10-08 LAB — MRSA PCR SCREENING: MRSA by PCR: NEGATIVE

## 2017-10-08 MED ORDER — LOSARTAN POTASSIUM 50 MG PO TABS
100.0000 mg | ORAL_TABLET | Freq: Every day | ORAL | Status: DC
  Administered 2017-10-08 – 2017-10-09 (×2): 100 mg via ORAL
  Filled 2017-10-08 (×2): qty 2

## 2017-10-08 MED ORDER — DICLOFENAC SODIUM 1 % TD GEL
2.0000 g | Freq: Four times a day (QID) | TRANSDERMAL | Status: DC
  Administered 2017-10-08 – 2017-10-09 (×5): 2 g via TOPICAL
  Filled 2017-10-08: qty 100

## 2017-10-08 MED ORDER — INSULIN GLARGINE 100 UNIT/ML ~~LOC~~ SOLN
8.0000 [IU] | Freq: Every day | SUBCUTANEOUS | Status: DC
  Administered 2017-10-08 – 2017-10-09 (×2): 8 [IU] via SUBCUTANEOUS
  Filled 2017-10-08 (×2): qty 0.08

## 2017-10-08 MED ORDER — ACETAMINOPHEN 325 MG PO TABS
650.0000 mg | ORAL_TABLET | Freq: Four times a day (QID) | ORAL | Status: DC | PRN
  Administered 2017-10-08: 650 mg via ORAL
  Filled 2017-10-08: qty 2

## 2017-10-08 NOTE — Progress Notes (Signed)
STROKE TEAM PROGRESS NOTE   INTERVAL HISTORY No family is at the bedside.  Pt lying in bed, no distress. No acute event overnight. Pending TEE and loop in am.   Vitals:   10/08/17 0456 10/08/17 0748 10/08/17 1158 10/08/17 1515  BP: (!) 154/77 (!) 180/73 (!) 189/81 (!) 133/57  Pulse: 94 96 (!) 103 87  Resp: 18 20 20 16   Temp: 98.2 F (36.8 C) 98.2 F (36.8 C) 98.6 F (37 C) 98.2 F (36.8 C)  TempSrc: Oral Oral Oral Oral  SpO2: 100% 98% 97% 100%  Weight:      Height:        CBC:  Recent Labs  Lab 10/06/17 0751  WBC 7.8  NEUTROABS 6.3  HGB 13.3  HCT 43.5  MCV 95.6  PLT 409    Basic Metabolic Panel:  Recent Labs  Lab 10/07/17 0507 10/08/17 0546  NA 142 142  K 3.4* 3.8  CL 107 107  CO2 28 26  GLUCOSE 199* 196*  BUN 17 14  CREATININE 1.50* 1.24*  CALCIUM 7.7* 7.9*   Lipid Panel:     Component Value Date/Time   CHOL 252 (H) 10/07/2017 0507   TRIG 109 10/07/2017 0507   HDL 53 10/07/2017 0507   CHOLHDL 4.8 10/07/2017 0507   VLDL 22 10/07/2017 0507   LDLCALC 177 (H) 10/07/2017 0507   HgbA1c:  Lab Results  Component Value Date   HGBA1C 7.6 (H) 10/07/2017   Urine Drug Screen:     Component Value Date/Time   LABOPIA NEG 10/25/2010 1348   LABOPIA NEG 09/01/2010 1614   COCAINSCRNUR NEG 10/25/2010 1348   LABBENZ NEG 10/25/2010 1348   AMPHETMU NEG 10/25/2010 1348    Alcohol Level No results found for: ETH  IMAGING Ct Angio Head W Or Wo Contrast  Result Date: 10/07/2017 CLINICAL DATA:  Stroke follow-up EXAM: CT ANGIOGRAPHY HEAD AND NECK TECHNIQUE: Multidetector CT imaging of the head and neck was performed using the standard protocol during bolus administration of intravenous contrast. Multiplanar CT image reconstructions and MIPs were obtained to evaluate the vascular anatomy. Carotid stenosis measurements (when applicable) are obtained utilizing NASCET criteria, using the distal internal carotid diameter as the denominator. CONTRAST:  45mL ISOVUE-370  IOPAMIDOL (ISOVUE-370) INJECTION 76% COMPARISON:  Head CT 10/06/2017 and brain MRI 10/06/2017 FINDINGS: CT HEAD FINDINGS Brain: There is no mass, hemorrhage or extra-axial collection. The size and configuration of the ventricles and extra-axial CSF spaces are normal. Focal hypoattenuation in the right corona radiata corresponds to known recent infarct. There is periventricular hypoattenuation compatible with chronic microvascular disease. Skull: The visualized skull base, calvarium and extracranial soft tissues are normal. Sinuses/Orbits: No fluid levels or advanced mucosal thickening of the visualized paranasal sinuses. No mastoid or middle ear effusion. The orbits are normal. CTA NECK FINDINGS AORTIC ARCH: There is no calcific atherosclerosis of the aortic arch. There is no aneurysm, dissection or hemodynamically significant stenosis of the visualized ascending aorta and aortic arch. Conventional 3 vessel aortic branching pattern. The visualized proximal subclavian arteries are widely patent. RIGHT CAROTID SYSTEM: --Common carotid artery: Widely patent origin without common carotid artery dissection or aneurysm. --Internal carotid artery: No dissection, occlusion or aneurysm. No hemodynamically significant stenosis. Minimal calcific atherosclerosis. --External carotid artery: No acute abnormality. LEFT CAROTID SYSTEM: --Common carotid artery: Widely patent origin without common carotid artery dissection or aneurysm. --Internal carotid artery:No dissection, occlusion or aneurysm. No hemodynamically significant stenosis. --External carotid artery: No acute abnormality. VERTEBRAL ARTERIES: Left dominant configuration. Both  origins are normal. No dissection, occlusion or flow-limiting stenosis to the vertebrobasilar confluence. SKELETON: Heterogeneous right thyroid nodule measures 1.8 cm. OTHER NECK: Normal pharynx, larynx and major salivary glands. No cervical lymphadenopathy. Unremarkable thyroid gland. UPPER CHEST:  No pneumothorax or pleural effusion. No nodules or masses. CTA HEAD FINDINGS ANTERIOR CIRCULATION: --Intracranial internal carotid arteries: Normal. --Anterior cerebral arteries: Normal. Both A1 segments are present. Patent anterior communicating artery. --Middle cerebral arteries: Normal. --Posterior communicating arteries: Absent bilaterally. POSTERIOR CIRCULATION: --Basilar artery: Normal. --Posterior cerebral arteries: Normal. --Superior cerebellar arteries: Normal. --Inferior cerebellar arteries: Normal anterior and posterior inferior cerebellar arteries. VENOUS SINUSES: As permitted by contrast timing, patent. ANATOMIC VARIANTS: None DELAYED PHASE: No parenchymal contrast enhancement. Review of the MIP images confirms the above findings. IMPRESSION: 1. No emergent large vessel occlusion or high-grade stenosis. 2. Minimal right carotid bifurcation calcific atherosclerosis, but no other vascular abnormality of the head and neck. 3. Heterogeneous right thyroid nodule measuring 1.8 cm. Correlation with dedicated nonemergent thyroid ultrasound is recommended. 4. Chronic ischemic microangiopathy with multiple known small vessel infarcts. No hemorrhage. Electronically Signed   By: Ulyses Jarred M.D.   On: 10/07/2017 14:24   Ct Angio Neck W Or Wo Contrast  Result Date: 10/07/2017 CLINICAL DATA:  Stroke follow-up EXAM: CT ANGIOGRAPHY HEAD AND NECK TECHNIQUE: Multidetector CT imaging of the head and neck was performed using the standard protocol during bolus administration of intravenous contrast. Multiplanar CT image reconstructions and MIPs were obtained to evaluate the vascular anatomy. Carotid stenosis measurements (when applicable) are obtained utilizing NASCET criteria, using the distal internal carotid diameter as the denominator. CONTRAST:  58mL ISOVUE-370 IOPAMIDOL (ISOVUE-370) INJECTION 76% COMPARISON:  Head CT 10/06/2017 and brain MRI 10/06/2017 FINDINGS: CT HEAD FINDINGS Brain: There is no mass,  hemorrhage or extra-axial collection. The size and configuration of the ventricles and extra-axial CSF spaces are normal. Focal hypoattenuation in the right corona radiata corresponds to known recent infarct. There is periventricular hypoattenuation compatible with chronic microvascular disease. Skull: The visualized skull base, calvarium and extracranial soft tissues are normal. Sinuses/Orbits: No fluid levels or advanced mucosal thickening of the visualized paranasal sinuses. No mastoid or middle ear effusion. The orbits are normal. CTA NECK FINDINGS AORTIC ARCH: There is no calcific atherosclerosis of the aortic arch. There is no aneurysm, dissection or hemodynamically significant stenosis of the visualized ascending aorta and aortic arch. Conventional 3 vessel aortic branching pattern. The visualized proximal subclavian arteries are widely patent. RIGHT CAROTID SYSTEM: --Common carotid artery: Widely patent origin without common carotid artery dissection or aneurysm. --Internal carotid artery: No dissection, occlusion or aneurysm. No hemodynamically significant stenosis. Minimal calcific atherosclerosis. --External carotid artery: No acute abnormality. LEFT CAROTID SYSTEM: --Common carotid artery: Widely patent origin without common carotid artery dissection or aneurysm. --Internal carotid artery:No dissection, occlusion or aneurysm. No hemodynamically significant stenosis. --External carotid artery: No acute abnormality. VERTEBRAL ARTERIES: Left dominant configuration. Both origins are normal. No dissection, occlusion or flow-limiting stenosis to the vertebrobasilar confluence. SKELETON: Heterogeneous right thyroid nodule measures 1.8 cm. OTHER NECK: Normal pharynx, larynx and major salivary glands. No cervical lymphadenopathy. Unremarkable thyroid gland. UPPER CHEST: No pneumothorax or pleural effusion. No nodules or masses. CTA HEAD FINDINGS ANTERIOR CIRCULATION: --Intracranial internal carotid arteries:  Normal. --Anterior cerebral arteries: Normal. Both A1 segments are present. Patent anterior communicating artery. --Middle cerebral arteries: Normal. --Posterior communicating arteries: Absent bilaterally. POSTERIOR CIRCULATION: --Basilar artery: Normal. --Posterior cerebral arteries: Normal. --Superior cerebellar arteries: Normal. --Inferior cerebellar arteries: Normal anterior and posterior inferior cerebellar arteries. VENOUS  SINUSES: As permitted by contrast timing, patent. ANATOMIC VARIANTS: None DELAYED PHASE: No parenchymal contrast enhancement. Review of the MIP images confirms the above findings. IMPRESSION: 1. No emergent large vessel occlusion or high-grade stenosis. 2. Minimal right carotid bifurcation calcific atherosclerosis, but no other vascular abnormality of the head and neck. 3. Heterogeneous right thyroid nodule measuring 1.8 cm. Correlation with dedicated nonemergent thyroid ultrasound is recommended. 4. Chronic ischemic microangiopathy with multiple known small vessel infarcts. No hemorrhage. Electronically Signed   By: Ulyses Jarred M.D.   On: 10/07/2017 14:24   2D echocardiogram - Left ventricle: The cavity size was normal. Wall thickness was increased in a pattern of mild LVH. Systolic function was normal. The estimated ejection fraction was in the range of 60% to 65%. Wall motion was normal; there were no regional wall motion abnormalities. The study is not technically sufficient to allow evaluation of LV diastolic function. - Left atrium: The atrium was mildly dilated. - Inferior vena cava: The vessel was normal in size. The respirophasic diameter changes were in the normal range (>= 50%), consistent with normal central venous pressure. Impressions:  LVEF 60-65%, mild LVH, normal wall motion, mild LAE, normal IVC.  Lower extremity Dopplers negative for deep vein thrombosis. There is no evidence of Baker's cyst bilaterally.   PHYSICAL EXAM HEENT-  Normocephalic, no lesions,  without obvious abnormality.  Normal external eye and conjunctiva. Cardiovascular- S1-S2 audible, pulses palpable throughout  Lungs-no rhonchi or wheezing noted, no excessive working breathing.  Saturations within normal limits on RA Extremities- Warm, dry and intact.  Skin-warm and dry  Neurological Examination Mental Status: Alert, oriented to person/age/[place]/ year/ city/state, thought content appropriate.  Speech is slow/delayed but fluent without errors of syntax or grammar. Able to name objects and able to follow all commands without difficulty. Slight dysarthria noted. Cranial Nerves: II:  Visual fields grossly normal;  III,IV, VI: ptosis not present, extra-ocular motions intact bilaterally. Pupils equal, round, reactive to light and accommodation. Eyes appear skewed at rest. Denies double vision. V,VII: smile with mild right sided droop, facial light touch sensation normal bilaterally VIII: hearing intact to voice IX,X: Mild hypophonia XI: No asymmetry XII: Midline tongue extension Motor: Right :  Upper extremity   4/5                                      Left:     Upper extremity   5/5             Lower extremity   4/5                                                  Lower extremity   5/5 Right side is weaker than left side Tone and bulk:normal tone throughout; no atrophy noted Sensory: Light touch intact throughout, bilaterally Deep Tendon Reflexes: 2+ and symmetric biceps and patellae Cerebellar: Prominent ataxia with right FNF, mild ataxia with left FNF. Dysmetric RLE Gait: deferred  ASSESSMENT/PLAN Chelsea [name] is a 67 y.o. female with history of HTN, HLD, DB, obesity, etoh abuse w/ withdrawal sz, presenting with HA, blurry vision, light headedness, L HP and 3 falls since Friday (waslks with walker or can at home, no hx falls previously).   Stroke:  Multiple bilateral infarcts appear embolic secondary to unknown source, suspicious for atrial fibrillation    CT head No acute stroke. Small vessel disease. Atrophy. Suspected small old lacunes R cerebellum, R PVWM, L thalamus, L LN. L maxillary sinus mucous retention cyst  MRI  Small vessel disease. Acute infarcts:  R middle cerebellar peduncle, R external capsule, L splenium/corpus callosum, L parietal WM  CTA head & neck no ELVO.  Minimal right carotid atherosclerosis.  Right thyroid nodule (nonemergent follow-up recommended). Small vessel disease.   2D Echo  EF 60-65%. No source of embolus   LE doppler negative for DVT  TEE to look for embolic source. Arranged with [place] for tomorrow.  (I have made patient NPO after midnight tonight).  If TEE negative, a [facility] electrophysiologist will consult and consider placement of an implantable loop recorder to evaluate for atrial fibrillation as etiology of stroke. This has been explained to patient/family by Dr. Erlinda Hong and they are agreeable.   LDL 177  HgbA1c 7.6  Lovenox 40 mg sq daily for VTE prophylaxis  No antithrombotic prior to admission (stopped by PCP some time ago, treated with aspirin 325 in ED, now on aspirin 81 mg daily and clopidogrel 75 mg daily.  Continue dual antiplatelets x3 weeks then aspirin alone.  Therapy recommendations:  CIR  Disposition:  pending   Hypertension  Stable on the higher side . Permissive hypertension (OK if < 220/120) but gradually normalize in 5-7 days . Long-term BP goal normotensive  Hyperlipidemia  Home meds:  lipitor 80, resumed in hospital  LDL 177, goal < 70  Continue statin at discharge  Diabetes type II  HgbA1c 7.6, goal < 7.0  Uncontrolled  DB RN following with recommendations ( -Lantus 14 units daily (approx. 50% home basal dose), -Change to carb modified diet if eating well, -Novolog 2-3 units tid meal coverage if eats 50%)  Other Stroke Risk Factors  Advanced age  Remote hx ETOH abuse w/ withdrawal sz. Stopped drinking  beer 1 mo ago  UDS / ETOH level not performed   Obesity, Body mass index is 37.59 kg/m., recommend weight loss, diet and exercise as appropriate   Other Active Problems  Nausea and vomiting likely secondary to cerebellar infarct  Hospital day # 2  ATTENDING NOTE: I reviewed above note and agree with the assessment and plan. I have made any additions or clarifications directly to the above note. Pt was seen and examined.   67 year old female with history of alcohol abuse, hypertension, hyperlipidemia, obesity and diabetes admitted for not feeling well, drooling, blurry vision, headache with left-sided weakness and falls at home.  CT no acute abnormality, but remote right cerebellum, left BG, right subcortical white matter, and left thalamus infarcts.  MRI showed right CR, left splenium, right cerebellar peduncle acute infarcts.  EF 60 to 65%.  Bilateral LE venous Doppler negative for DVT.  CT head and neck showed bilateral siphon and left ICA bifurcation atherosclerosis.  A1c 7.6 and LDL 177.  BP on the high side.  On examination, patient awake alert orientated x3, no significant facial droop, tongue midline.  Left upper extremity 4/5 with ataxia but proportional to the weakness.  Right upper extremity 5/5 but ataxia on finger-to-nose exam.  Bilateral lower extremity 5/5.  Patient stroke embolic pattern, source unknown.  However, patient does have multiple stroke risk factors including uncontrolled hypertension, hyperlipidemia, diabetes and obesity.  TEE/loop recorder pending in am for further cardioembolic evaluation.  Continue aspirin 81 and Plavix 75 DAPT for 3 weeks and then aspirin alone.  New high-dose Lipitor.    Rosalin Hawking, MD PhD Stroke Neurology 10/08/2017 3:29 PM   To contact Stroke Continuity provider, please refer to [email or web address] After hours, contact General Neurology

## 2017-10-08 NOTE — Progress Notes (Signed)
Physical Therapy Treatment Patient Details Name: Chelsea [name] MRN: [id number] DOB: [birth year] Today's Date: 10/08/2017    History of Present Illness ARNICE VANEPPS is an 67 y.o. female with PMH of HTN, HLD, DM2, withdrawal seizures presented to [facility] for evaluation of weakness and falling. MRI revealed 4 small acute/subacute strokes in separate vascular territories (middle cerebral peduncle, external capsule, L spenium of External caplsue, and L parietal white matter.)    PT Comments    Patient progressing some this session with ability to transfer OOB to [facility] and to recliner.  She continues with R side deficits limiting balance, safety and independence.  Continue to feel she is appropriate for CIR level rehab.  PT to follow acutely.   Follow Up Recommendations  CIR;Supervision/Assistance - 24 hour     Equipment Recommendations  None recommended by PT    Recommendations for Other Services       Precautions / Restrictions Precautions Precautions: Fall Precaution Comments: R lateral lean    Mobility  Bed Mobility Overal bed mobility: Needs Assistance Bed Mobility: Supine to Sit       Sit to supine: Min assist   General bed mobility comments: to right trunk; pt able to scoot to EOB  Transfers Overall transfer level: Needs assistance Equipment used: Rolling walker (2 wheeled) Transfers: Sit to/from [facility] Sit to Stand: Mod assist Stand pivot transfers: Max assist       General transfer comment: assist for lift off, cues and assist for R UE placement due to ataxia; pivot to BSC max A due to LOB to R and assist to lift onto her feet, tends to keep feet to L and leans R mild pushing.  Assist for [facility] to recliner on R mod to max A for balance; assist from behind for weight shifting and stepping back to bed then sits with uncontrolled descent  Ambulation/Gait                 Stairs             Wheelchair Mobility    Modified Rankin  (Stroke Patients Only) Modified Rankin (Stroke Patients Only) Pre-Morbid Rankin Score: Moderate disability Modified Rankin: Moderately severe disability     Balance Overall balance assessment: Needs assistance   Sitting balance-Leahy Scale: Poor Sitting balance - Comments: leaning to R needing UE support and S                                    Cognition Arousal/Alertness: Awake/alert Behavior During Therapy: Anxious Overall Cognitive Status: Impaired/Different from baseline Area of Impairment: Safety/judgement;Attention;Memory                   Current Attention Level: Sustained Memory: Decreased short-term memory   Safety/Judgement: Decreased awareness of safety;Decreased awareness of deficits            Exercises      General Comments        Pertinent Vitals/Pain Pain Assessment: Faces Faces Pain Scale: Hurts little more Pain Location: L foot Pain Descriptors / Indicators: Aching;Sore Pain Intervention(s): Monitored during session;Repositioned    Home Living                      Prior Function            PT Goals (current goals can now be found in the care plan section) Progress towards  PT goals: Progressing toward goals    Frequency    Min 4X/week      PT Plan Current plan remains appropriate    Co-evaluation              AM-PAC PT "6 Clicks" Daily Activity  Outcome Measure  Difficulty turning over in bed (including adjusting bedclothes, sheets and blankets)?: A Lot Difficulty moving from lying on back to sitting on the side of the bed? : Unable Difficulty sitting down on and standing up from a chair with arms (e.g., wheelchair, bedside commode, etc,.)?: Unable Help needed moving to and from a bed to chair (including a wheelchair)?: A Lot Help needed walking in hospital room?: A Lot Help needed climbing 3-5 steps with a railing? : Total 6 Click Score: 9    End of Session Equipment Utilized During  Treatment: Gait belt Activity Tolerance: Patient tolerated treatment well Patient left: in chair;with chair alarm set;with call bell/phone within reach Nurse Communication: Mobility status PT Visit Diagnosis: Other abnormalities of gait and mobility (R26.89);Other symptoms and signs involving the nervous system (R29.898);Dizziness and giddiness (R42)     Time: 1093-2355 PT Time Calculation (min) (ACUTE ONLY): 24 min  Charges:  $Therapeutic Activity: 23-37 mins                     [place], [place] [phone] 10/08/2017    Reginia Naas 10/08/2017, 4:23 PM

## 2017-10-08 NOTE — Progress Notes (Signed)
Internal Medicine Attending:   I saw and examined the patient. I reviewed the resident's note and I agree with the resident's findings and plan as documented in the resident's note.  Hospital day #3 with multifocal acute and subacute CVA due to either small vessel disease or a cardioembolic source.  Currently doing well on treatments with aspirin, Plavix, and statin.  Her symptoms of dizziness and nausea are improving.  Permissive hypertension, blood pressures are elevated, I agree with restarting losartan today.  Planning for TEE and possibly implantable recorder tomorrow to look for paroxysmal atrial fibrillation.  Overall disposition I think would be best to inpatient rehabilitation.

## 2017-10-08 NOTE — Plan of Care (Signed)
  Problem: Education: Goal: Knowledge of General Education information will improve Description Including pain rating scale, medication(s)/side effects and non-pharmacologic comfort measures Outcome: Progressing   

## 2017-10-08 NOTE — Progress Notes (Signed)
Inpatient Rehabilitation Admissions Coordinator  I met with patient at bedside to discuss goals and expectations of an inpt rehab admission. She prefers inpt rehab rather than SNF. She asked me to contact her Daughter [name] to discuss. I contacted [name] by phone and she is also in agreement to admission. Does not want SNF for her Mom. She is arranging for caregiver support at home once rehab is complete. Noted patient for TEE 8/28. I will follow up tomorrow .  Danne Baxter, RN, MSN Rehab Admissions Coordinator [phone] 10/08/2017 10:59 AM

## 2017-10-08 NOTE — H&P (View-Only) (Signed)
STROKE TEAM PROGRESS NOTE   INTERVAL HISTORY No family is at the bedside.  Pt lying in bed, no distress. No acute event overnight. Pending TEE and loop in am.   Vitals:   10/08/17 0456 10/08/17 0748 10/08/17 1158 10/08/17 1515  BP: (!) 154/77 (!) 180/73 (!) 189/81 (!) 133/57  Pulse: 94 96 (!) 103 87  Resp: 18 20 20 16   Temp: 98.2 F (36.8 C) 98.2 F (36.8 C) 98.6 F (37 C) 98.2 F (36.8 C)  TempSrc: Oral Oral Oral Oral  SpO2: 100% 98% 97% 100%  Weight:      Height:        CBC:  Recent Labs  Lab 10/06/17 0751  WBC 7.8  NEUTROABS 6.3  HGB 13.3  HCT 43.5  MCV 95.6  PLT 194    Basic Metabolic Panel:  Recent Labs  Lab 10/07/17 0507 10/08/17 0546  NA 142 142  K 3.4* 3.8  CL 107 107  CO2 28 26  GLUCOSE 199* 196*  BUN 17 14  CREATININE 1.50* 1.24*  CALCIUM 7.7* 7.9*   Lipid Panel:     Component Value Date/Time   CHOL 252 (H) 10/07/2017 0507   TRIG 109 10/07/2017 0507   HDL 53 10/07/2017 0507   CHOLHDL 4.8 10/07/2017 0507   VLDL 22 10/07/2017 0507   LDLCALC 177 (H) 10/07/2017 0507   HgbA1c:  Lab Results  Component Value Date   HGBA1C 7.6 (H) 10/07/2017   Urine Drug Screen:     Component Value Date/Time   LABOPIA NEG 10/25/2010 1348   LABOPIA NEG 09/01/2010 1614   COCAINSCRNUR NEG 10/25/2010 1348   LABBENZ NEG 10/25/2010 1348   AMPHETMU NEG 10/25/2010 1348    Alcohol Level No results found for: ETH  IMAGING Ct Angio Head W Or Wo Contrast  Result Date: 10/07/2017 CLINICAL DATA:  Stroke follow-up EXAM: CT ANGIOGRAPHY HEAD AND NECK TECHNIQUE: Multidetector CT imaging of the head and neck was performed using the standard protocol during bolus administration of intravenous contrast. Multiplanar CT image reconstructions and MIPs were obtained to evaluate the vascular anatomy. Carotid stenosis measurements (when applicable) are obtained utilizing NASCET criteria, using the distal internal carotid diameter as the denominator. CONTRAST:  35mL ISOVUE-370  IOPAMIDOL (ISOVUE-370) INJECTION 76% COMPARISON:  Head CT 10/06/2017 and brain MRI 10/06/2017 FINDINGS: CT HEAD FINDINGS Brain: There is no mass, hemorrhage or extra-axial collection. The size and configuration of the ventricles and extra-axial CSF spaces are normal. Focal hypoattenuation in the right corona radiata corresponds to known recent infarct. There is periventricular hypoattenuation compatible with chronic microvascular disease. Skull: The visualized skull base, calvarium and extracranial soft tissues are normal. Sinuses/Orbits: No fluid levels or advanced mucosal thickening of the visualized paranasal sinuses. No mastoid or middle ear effusion. The orbits are normal. CTA NECK FINDINGS AORTIC ARCH: There is no calcific atherosclerosis of the aortic arch. There is no aneurysm, dissection or hemodynamically significant stenosis of the visualized ascending aorta and aortic arch. Conventional 3 vessel aortic branching pattern. The visualized proximal subclavian arteries are widely patent. RIGHT CAROTID SYSTEM: --Common carotid artery: Widely patent origin without common carotid artery dissection or aneurysm. --Internal carotid artery: No dissection, occlusion or aneurysm. No hemodynamically significant stenosis. Minimal calcific atherosclerosis. --External carotid artery: No acute abnormality. LEFT CAROTID SYSTEM: --Common carotid artery: Widely patent origin without common carotid artery dissection or aneurysm. --Internal carotid artery:No dissection, occlusion or aneurysm. No hemodynamically significant stenosis. --External carotid artery: No acute abnormality. VERTEBRAL ARTERIES: Left dominant configuration. Both  origins are normal. No dissection, occlusion or flow-limiting stenosis to the vertebrobasilar confluence. SKELETON: Heterogeneous right thyroid nodule measures 1.8 cm. OTHER NECK: Normal pharynx, larynx and major salivary glands. No cervical lymphadenopathy. Unremarkable thyroid gland. UPPER CHEST:  No pneumothorax or pleural effusion. No nodules or masses. CTA HEAD FINDINGS ANTERIOR CIRCULATION: --Intracranial internal carotid arteries: Normal. --Anterior cerebral arteries: Normal. Both A1 segments are present. Patent anterior communicating artery. --Middle cerebral arteries: Normal. --Posterior communicating arteries: Absent bilaterally. POSTERIOR CIRCULATION: --Basilar artery: Normal. --Posterior cerebral arteries: Normal. --Superior cerebellar arteries: Normal. --Inferior cerebellar arteries: Normal anterior and posterior inferior cerebellar arteries. VENOUS SINUSES: As permitted by contrast timing, patent. ANATOMIC VARIANTS: None DELAYED PHASE: No parenchymal contrast enhancement. Review of the MIP images confirms the above findings. IMPRESSION: 1. No emergent large vessel occlusion or high-grade stenosis. 2. Minimal right carotid bifurcation calcific atherosclerosis, but no other vascular abnormality of the head and neck. 3. Heterogeneous right thyroid nodule measuring 1.8 cm. Correlation with dedicated nonemergent thyroid ultrasound is recommended. 4. Chronic ischemic microangiopathy with multiple known small vessel infarcts. No hemorrhage. Electronically Signed   By: Ulyses Jarred M.D.   On: 10/07/2017 14:24   Ct Angio Neck W Or Wo Contrast  Result Date: 10/07/2017 CLINICAL DATA:  Stroke follow-up EXAM: CT ANGIOGRAPHY HEAD AND NECK TECHNIQUE: Multidetector CT imaging of the head and neck was performed using the standard protocol during bolus administration of intravenous contrast. Multiplanar CT image reconstructions and MIPs were obtained to evaluate the vascular anatomy. Carotid stenosis measurements (when applicable) are obtained utilizing NASCET criteria, using the distal internal carotid diameter as the denominator. CONTRAST:  36mL ISOVUE-370 IOPAMIDOL (ISOVUE-370) INJECTION 76% COMPARISON:  Head CT 10/06/2017 and brain MRI 10/06/2017 FINDINGS: CT HEAD FINDINGS Brain: There is no mass,  hemorrhage or extra-axial collection. The size and configuration of the ventricles and extra-axial CSF spaces are normal. Focal hypoattenuation in the right corona radiata corresponds to known recent infarct. There is periventricular hypoattenuation compatible with chronic microvascular disease. Skull: The visualized skull base, calvarium and extracranial soft tissues are normal. Sinuses/Orbits: No fluid levels or advanced mucosal thickening of the visualized paranasal sinuses. No mastoid or middle ear effusion. The orbits are normal. CTA NECK FINDINGS AORTIC ARCH: There is no calcific atherosclerosis of the aortic arch. There is no aneurysm, dissection or hemodynamically significant stenosis of the visualized ascending aorta and aortic arch. Conventional 3 vessel aortic branching pattern. The visualized proximal subclavian arteries are widely patent. RIGHT CAROTID SYSTEM: --Common carotid artery: Widely patent origin without common carotid artery dissection or aneurysm. --Internal carotid artery: No dissection, occlusion or aneurysm. No hemodynamically significant stenosis. Minimal calcific atherosclerosis. --External carotid artery: No acute abnormality. LEFT CAROTID SYSTEM: --Common carotid artery: Widely patent origin without common carotid artery dissection or aneurysm. --Internal carotid artery:No dissection, occlusion or aneurysm. No hemodynamically significant stenosis. --External carotid artery: No acute abnormality. VERTEBRAL ARTERIES: Left dominant configuration. Both origins are normal. No dissection, occlusion or flow-limiting stenosis to the vertebrobasilar confluence. SKELETON: Heterogeneous right thyroid nodule measures 1.8 cm. OTHER NECK: Normal pharynx, larynx and major salivary glands. No cervical lymphadenopathy. Unremarkable thyroid gland. UPPER CHEST: No pneumothorax or pleural effusion. No nodules or masses. CTA HEAD FINDINGS ANTERIOR CIRCULATION: --Intracranial internal carotid arteries:  Normal. --Anterior cerebral arteries: Normal. Both A1 segments are present. Patent anterior communicating artery. --Middle cerebral arteries: Normal. --Posterior communicating arteries: Absent bilaterally. POSTERIOR CIRCULATION: --Basilar artery: Normal. --Posterior cerebral arteries: Normal. --Superior cerebellar arteries: Normal. --Inferior cerebellar arteries: Normal anterior and posterior inferior cerebellar arteries. VENOUS  SINUSES: As permitted by contrast timing, patent. ANATOMIC VARIANTS: None DELAYED PHASE: No parenchymal contrast enhancement. Review of the MIP images confirms the above findings. IMPRESSION: 1. No emergent large vessel occlusion or high-grade stenosis. 2. Minimal right carotid bifurcation calcific atherosclerosis, but no other vascular abnormality of the head and neck. 3. Heterogeneous right thyroid nodule measuring 1.8 cm. Correlation with dedicated nonemergent thyroid ultrasound is recommended. 4. Chronic ischemic microangiopathy with multiple known small vessel infarcts. No hemorrhage. Electronically Signed   By: Ulyses Jarred M.D.   On: 10/07/2017 14:24   2D echocardiogram - Left ventricle: The cavity size was normal. Wall thickness was increased in a pattern of mild LVH. Systolic function was normal. The estimated ejection fraction was in the range of 60% to 65%. Wall motion was normal; there were no regional wall motion abnormalities. The study is not technically sufficient to allow evaluation of LV diastolic function. - Left atrium: The atrium was mildly dilated. - Inferior vena cava: The vessel was normal in size. The respirophasic diameter changes were in the normal range (>= 50%), consistent with normal central venous pressure. Impressions:  LVEF 60-65%, mild LVH, normal wall motion, mild LAE, normal IVC.  Lower extremity Dopplers negative for deep vein thrombosis. There is no evidence of Baker's cyst bilaterally.   PHYSICAL EXAM HEENT-  Normocephalic, no lesions,  without obvious abnormality.  Normal external eye and conjunctiva. Cardiovascular- S1-S2 audible, pulses palpable throughout  Lungs-no rhonchi or wheezing noted, no excessive working breathing.  Saturations within normal limits on RA Extremities- Warm, dry and intact.  Skin-warm and dry  Neurological Examination Mental Status: Alert, oriented to person/age/[place]/ year/ city/state, thought content appropriate.  Speech is slow/delayed but fluent without errors of syntax or grammar. Able to name objects and able to follow all commands without difficulty. Slight dysarthria noted. Cranial Nerves: II:  Visual fields grossly normal;  III,IV, VI: ptosis not present, extra-ocular motions intact bilaterally. Pupils equal, round, reactive to light and accommodation. Eyes appear skewed at rest. Denies double vision. V,VII: smile with mild right sided droop, facial light touch sensation normal bilaterally VIII: hearing intact to voice IX,X: Mild hypophonia XI: No asymmetry XII: Midline tongue extension Motor: Right :  Upper extremity   4/5                                      Left:     Upper extremity   5/5             Lower extremity   4/5                                                  Lower extremity   5/5 Right side is weaker than left side Tone and bulk:normal tone throughout; no atrophy noted Sensory: Light touch intact throughout, bilaterally Deep Tendon Reflexes: 2+ and symmetric biceps and patellae Cerebellar: Prominent ataxia with right FNF, mild ataxia with left FNF. Dysmetric RLE Gait: deferred  ASSESSMENT/PLAN Chelsea [name] is a 67 y.o. female with history of HTN, HLD, DB, obesity, etoh abuse w/ withdrawal sz, presenting with HA, blurry vision, light headedness, L HP and 3 falls since Friday (waslks with walker or can at home, no hx falls previously).   Stroke:  Multiple bilateral infarcts appear embolic secondary to unknown source, suspicious for atrial fibrillation    CT head No acute stroke. Small vessel disease. Atrophy. Suspected small old lacunes R cerebellum, R PVWM, L thalamus, L LN. L maxillary sinus mucous retention cyst  MRI  Small vessel disease. Acute infarcts:  R middle cerebellar peduncle, R external capsule, L splenium/corpus callosum, L parietal WM  CTA head & neck no ELVO.  Minimal right carotid atherosclerosis.  Right thyroid nodule (nonemergent follow-up recommended). Small vessel disease.   2D Echo  EF 60-65%. No source of embolus   LE doppler negative for DVT  TEE to look for embolic source. Arranged with [place] for tomorrow.  (I have made patient NPO after midnight tonight).  If TEE negative, a [place] electrophysiologist will consult and consider placement of an implantable loop recorder to evaluate for atrial fibrillation as etiology of stroke. This has been explained to patient/family by Dr. Erlinda Hong and they are agreeable.   LDL 177  HgbA1c 7.6  Lovenox 40 mg sq daily for VTE prophylaxis  No antithrombotic prior to admission (stopped by PCP some time ago, treated with aspirin 325 in ED, now on aspirin 81 mg daily and clopidogrel 75 mg daily.  Continue dual antiplatelets x3 weeks then aspirin alone.  Therapy recommendations:  CIR  Disposition:  pending   Hypertension  Stable on the higher side . Permissive hypertension (OK if < 220/120) but gradually normalize in 5-7 days . Long-term BP goal normotensive  Hyperlipidemia  Home meds:  lipitor 80, resumed in hospital  LDL 177, goal < 70  Continue statin at discharge  Diabetes type II  HgbA1c 7.6, goal < 7.0  Uncontrolled  DB RN following with recommendations ( -Lantus 14 units daily (approx. 50% home basal dose), -Change to carb modified diet if eating well, -Novolog 2-3 units tid meal coverage if eats 50%)  Other Stroke Risk Factors  Advanced age  Remote hx ETOH abuse w/ withdrawal sz. Stopped drinking  beer 1 mo ago  UDS / ETOH level not performed   Obesity, Body mass index is 37.59 kg/m., recommend weight loss, diet and exercise as appropriate   Other Active Problems  Nausea and vomiting likely secondary to cerebellar infarct  Hospital day # 2  ATTENDING NOTE: I reviewed above note and agree with the assessment and plan. I have made any additions or clarifications directly to the above note. Pt was seen and examined.   67 year old female with history of alcohol abuse, hypertension, hyperlipidemia, obesity and diabetes admitted for not feeling well, drooling, blurry vision, headache with left-sided weakness and falls at home.  CT no acute abnormality, but remote right cerebellum, left BG, right subcortical white matter, and left thalamus infarcts.  MRI showed right CR, left splenium, right cerebellar peduncle acute infarcts.  EF 60 to 65%.  Bilateral LE venous Doppler negative for DVT.  CT head and neck showed bilateral siphon and left ICA bifurcation atherosclerosis.  A1c 7.6 and LDL 177.  BP on the high side.  On examination, patient awake alert orientated x3, no significant facial droop, tongue midline.  Left upper extremity 4/5 with ataxia but proportional to the weakness.  Right upper extremity 5/5 but ataxia on finger-to-nose exam.  Bilateral lower extremity 5/5.  Patient stroke embolic pattern, source unknown.  However, patient does have multiple stroke risk factors including uncontrolled hypertension, hyperlipidemia, diabetes and obesity.  TEE/loop recorder pending in am for further cardioembolic evaluation.  Continue aspirin 81 and Plavix 75 DAPT for 3 weeks and then aspirin alone.  New high-dose Lipitor.    Rosalin Hawking, MD PhD Stroke Neurology 10/08/2017 3:29 PM   To contact Stroke Continuity provider, please refer to [email or web address] After hours, contact General Neurology

## 2017-10-08 NOTE — Progress Notes (Signed)
   Subjective: One episode of dizziness and nausea this morning upon standing to go to the bathroom. Reports that her numbness in the left arm and leg is improving but she started experiencing pain in her left foot yesterday when trying walk. We discussed inpatient rehab. She would like to go home but I explained that inpatient rehab would be the most effective way to get her stronger and more independent once she goes home.    Objective:  Vital signs in last 24 hours: Vitals:   10/07/17 1937 10/08/17 0021 10/08/17 0456 10/08/17 0748  BP: (!) 181/78 (!) 164/61 (!) 154/77 (!) 180/73  Pulse: 92 90 94 96  Resp: 18 18 18 20   Temp: 98.1 F (36.7 C) 98.6 F (37 C) 98.2 F (36.8 C) 98.2 F (36.8 C)  TempSrc: Oral Oral Oral Oral  SpO2: 100% 99% 100% 98%  Weight:      Height:       Gen: Tired appearing, in bed. CV: RRR, no murmurs Neuro: sensation intact and symmetric bilaterally on upper extremities, 4/5 strength with left arm extension. 5/5 strength with straight arm raise and flexion of the left arm. Right arm 5/5 strength.  MSK: left ankle mildly tender to palpation without swelling, redness, or warmth. Decreased active ROM with dorsiflexion and severe painful ROM with passive dorsiflexion. No pain with active or passive plantarflexion. Strength of dorsiflexion is limited due to pain.    Assessment/Plan:  Principal Problem:   Stroke [facility]) Active Problems:   Type 2 diabetes mellitus with diabetic retinopathy ([place])   Hyperlipidemia associated with type 2 diabetes mellitus ([place])   Hypertension associated with diabetes ([place])   Cerebellar infarct ([place])   Hypokalemia   Stage 3 chronic kidney disease ([place])   Chelsea [name] is a 55yoF with diabetes, osteopenia, HTN, HLD who presents for evaluation of a one week history of weakness, falls, and one day of n/v and brain MRI revealed multiple areas of infarct.   Stroke: Recommending CIR, patient has been evaluated and qualifies. CTA head and  neck without large vessel occlusion or high-grade stenosis. Transthoracic echo without valve abnormalities, no patent foramen ovale, EF 60-65%, mild LVH, mildly dilated left atrium. Lower extremity dopplers negative for DVT. Will proceed with TEE tomorrow to evaluate left atrial appendage. Tolerating regular diet and thin liquids. Continue baby aspirin, statin, plavix.    Left foot pain: Started yesterday upon walking. She experienced pain with weightbearing, worse on the dorsum of her foot. She feels like the feeling in her foot is returning. Sensation is symmetric bilaterally. Strength on dorsiflexion is limited due to pain. No swelling/redness/warmth. Will give tylenol. See how she does with PT/OT today. Can consider x-ray if symptoms do not improve.   Nausea/vomiting: Episode this morning upon standing to use the bathroom. Endorsing dizziness upon standing. Will get orthostatic vital signs. Continue prn promethazine. If orthostatic vital signs are negative, can try meclizine or scopolamine patch.   HTN: Period of permissive hypertension is over. Added back home losartan 100mg  daily. Will continue to monitor and add back other home meds if appropriate.  Diabetes: Lantus 8 units, sensitive sliding scale novolog with meals.   Dispo: Anticipated discharge in approximately 1-2 day(s).   Isabelle Course, MD 10/08/2017, 11:23 AM Pager: [phone]

## 2017-10-09 ENCOUNTER — Inpatient Hospital Stay (HOSPITAL_COMMUNITY): Payer: Medicare Other

## 2017-10-09 ENCOUNTER — Inpatient Hospital Stay (HOSPITAL_COMMUNITY)
Admission: RE | Admit: 2017-10-09 | Discharge: 2017-10-29 | DRG: 057 | Disposition: A | Discharge status: Home Health | Payer: Medicare Other | Source: Intra-hospital | Attending: Physical Medicine & Rehabilitation | Admitting: Physical Medicine & Rehabilitation

## 2017-10-09 ENCOUNTER — Other Ambulatory Visit: Payer: Self-pay

## 2017-10-09 ENCOUNTER — Encounter (HOSPITAL_COMMUNITY): Payer: Self-pay | Admitting: *Deleted

## 2017-10-09 ENCOUNTER — Encounter (HOSPITAL_COMMUNITY): Payer: Self-pay

## 2017-10-09 ENCOUNTER — Encounter (HOSPITAL_COMMUNITY)
Admission: EM | Disposition: A | Discharge status: Rehabilitation Facility | Payer: Self-pay | Source: Home / Self Care | Attending: Student in an Organized Health Care Education/Training Program

## 2017-10-09 DIAGNOSIS — Z888 Allergy status to other drugs, medicaments and biological substances status: Secondary | ICD-10-CM

## 2017-10-09 DIAGNOSIS — E669 Obesity, unspecified: Secondary | ICD-10-CM

## 2017-10-09 DIAGNOSIS — R0989 Other specified symptoms and signs involving the circulatory and respiratory systems: Secondary | ICD-10-CM

## 2017-10-09 DIAGNOSIS — Z880 Allergy status to penicillin: Secondary | ICD-10-CM

## 2017-10-09 DIAGNOSIS — I951 Orthostatic hypotension: Secondary | ICD-10-CM | POA: Diagnosis not present

## 2017-10-09 DIAGNOSIS — I129 Hypertensive chronic kidney disease with stage 1 through stage 4 chronic kidney disease, or unspecified chronic kidney disease: Secondary | ICD-10-CM | POA: Diagnosis present

## 2017-10-09 DIAGNOSIS — I6389 Other cerebral infarction: Secondary | ICD-10-CM

## 2017-10-09 DIAGNOSIS — Z7951 Long term (current) use of inhaled steroids: Secondary | ICD-10-CM

## 2017-10-09 DIAGNOSIS — Z7982 Long term (current) use of aspirin: Secondary | ICD-10-CM

## 2017-10-09 DIAGNOSIS — N189 Chronic kidney disease, unspecified: Secondary | ICD-10-CM | POA: Diagnosis present

## 2017-10-09 DIAGNOSIS — I152 Hypertension secondary to endocrine disorders: Secondary | ICD-10-CM

## 2017-10-09 DIAGNOSIS — J45909 Unspecified asthma, uncomplicated: Secondary | ICD-10-CM | POA: Diagnosis present

## 2017-10-09 DIAGNOSIS — R7989 Other specified abnormal findings of blood chemistry: Secondary | ICD-10-CM

## 2017-10-09 DIAGNOSIS — Z794 Long term (current) use of insulin: Secondary | ICD-10-CM

## 2017-10-09 DIAGNOSIS — E785 Hyperlipidemia, unspecified: Secondary | ICD-10-CM | POA: Diagnosis present

## 2017-10-09 DIAGNOSIS — R52 Pain, unspecified: Secondary | ICD-10-CM

## 2017-10-09 DIAGNOSIS — I6381 Other cerebral infarction due to occlusion or stenosis of small artery: Secondary | ICD-10-CM

## 2017-10-09 DIAGNOSIS — I639 Cerebral infarction, unspecified: Secondary | ICD-10-CM | POA: Diagnosis present

## 2017-10-09 DIAGNOSIS — D72829 Elevated white blood cell count, unspecified: Secondary | ICD-10-CM

## 2017-10-09 DIAGNOSIS — Z886 Allergy status to analgesic agent status: Secondary | ICD-10-CM

## 2017-10-09 DIAGNOSIS — R7309 Other abnormal glucose: Secondary | ICD-10-CM

## 2017-10-09 DIAGNOSIS — N183 Chronic kidney disease, stage 3 unspecified: Secondary | ICD-10-CM

## 2017-10-09 DIAGNOSIS — F410 Panic disorder [episodic paroxysmal anxiety] without agoraphobia: Secondary | ICD-10-CM | POA: Diagnosis present

## 2017-10-09 DIAGNOSIS — Z833 Family history of diabetes mellitus: Secondary | ICD-10-CM

## 2017-10-09 DIAGNOSIS — Z79899 Other long term (current) drug therapy: Secondary | ICD-10-CM

## 2017-10-09 DIAGNOSIS — G894 Chronic pain syndrome: Secondary | ICD-10-CM | POA: Diagnosis present

## 2017-10-09 DIAGNOSIS — I10 Essential (primary) hypertension: Secondary | ICD-10-CM

## 2017-10-09 DIAGNOSIS — E11319 Type 2 diabetes mellitus with unspecified diabetic retinopathy without macular edema: Secondary | ICD-10-CM | POA: Diagnosis present

## 2017-10-09 DIAGNOSIS — E1169 Type 2 diabetes mellitus with other specified complication: Secondary | ICD-10-CM | POA: Diagnosis present

## 2017-10-09 DIAGNOSIS — E1122 Type 2 diabetes mellitus with diabetic chronic kidney disease: Secondary | ICD-10-CM | POA: Diagnosis present

## 2017-10-09 DIAGNOSIS — K5900 Constipation, unspecified: Secondary | ICD-10-CM | POA: Diagnosis present

## 2017-10-09 DIAGNOSIS — K219 Gastro-esophageal reflux disease without esophagitis: Secondary | ICD-10-CM | POA: Diagnosis present

## 2017-10-09 DIAGNOSIS — E1159 Type 2 diabetes mellitus with other circulatory complications: Secondary | ICD-10-CM

## 2017-10-09 DIAGNOSIS — I69354 Hemiplegia and hemiparesis following cerebral infarction affecting left non-dominant side: Principal | ICD-10-CM

## 2017-10-09 DIAGNOSIS — D62 Acute posthemorrhagic anemia: Secondary | ICD-10-CM | POA: Diagnosis present

## 2017-10-09 DIAGNOSIS — N179 Acute kidney failure, unspecified: Secondary | ICD-10-CM | POA: Diagnosis present

## 2017-10-09 DIAGNOSIS — Z7902 Long term (current) use of antithrombotics/antiplatelets: Secondary | ICD-10-CM

## 2017-10-09 HISTORY — PX: TEE WITHOUT CARDIOVERSION: SHX5443

## 2017-10-09 LAB — GLUCOSE, CAPILLARY
GLUCOSE-CAPILLARY: 209 mg/dL — AB (ref 70–99)
GLUCOSE-CAPILLARY: 133 mg/dL — AB (ref 70–99)
GLUCOSE-CAPILLARY: 227 mg/dL — AB (ref 70–99)
Glucose-Capillary: 243 mg/dL — ABNORMAL HIGH (ref 70–99)

## 2017-10-09 SURGERY — ECHOCARDIOGRAM, TRANSESOPHAGEAL
Anesthesia: Moderate Sedation

## 2017-10-09 MED ORDER — ASPIRIN EC 81 MG PO TBEC
81.0000 mg | DELAYED_RELEASE_TABLET | Freq: Every day | ORAL | Status: DC
  Administered 2017-10-10 – 2017-10-29 (×20): 81 mg via ORAL
  Filled 2017-10-09 (×21): qty 1

## 2017-10-09 MED ORDER — PREMIER PROTEIN SHAKE
11.0000 [oz_av] | Freq: Three times a day (TID) | ORAL | Status: DC
  Administered 2017-10-10 – 2017-10-11 (×4): 11 [oz_av] via ORAL
  Filled 2017-10-09 (×6): qty 325.31

## 2017-10-09 MED ORDER — HYDRALAZINE HCL 20 MG/ML IJ SOLN
5.0000 mg | Freq: Once | INTRAMUSCULAR | Status: AC
  Administered 2017-10-09: 5 mg via INTRAVENOUS

## 2017-10-09 MED ORDER — PNEUMOCOCCAL VAC POLYVALENT 25 MCG/0.5ML IJ INJ: 0.5000 mL | INJECTION | INTRAMUSCULAR | Status: DC

## 2017-10-09 MED ORDER — ASPIRIN 81 MG PO TBEC: 81.0000 mg | DELAYED_RELEASE_TABLET | Freq: Every day | ORAL | 11 refills | Status: DC

## 2017-10-09 MED ORDER — LOSARTAN POTASSIUM 50 MG PO TABS
100.0000 mg | ORAL_TABLET | Freq: Every day | ORAL | Status: DC
  Administered 2017-10-10 – 2017-10-29 (×20): 100 mg via ORAL
  Filled 2017-10-09 (×23): qty 2

## 2017-10-09 MED ORDER — BISACODYL 10 MG RE SUPP: 10.0000 mg | Freq: Every day | RECTAL | Status: DC | PRN

## 2017-10-09 MED ORDER — CLOPIDOGREL BISULFATE 75 MG PO TABS
75.0000 mg | ORAL_TABLET | Freq: Every day | ORAL | Status: DC
  Administered 2017-10-10 – 2017-10-29 (×20): 75 mg via ORAL
  Filled 2017-10-09 (×21): qty 1

## 2017-10-09 MED ORDER — CLOPIDOGREL BISULFATE 75 MG PO TABS: 75.0000 mg | ORAL_TABLET | Freq: Every day | ORAL | 0 refills | Status: DC

## 2017-10-09 MED ORDER — ALBUTEROL SULFATE (2.5 MG/3ML) 0.083% IN NEBU: 2.5000 mg | INHALATION_SOLUTION | RESPIRATORY_TRACT | Status: DC | PRN

## 2017-10-09 MED ORDER — RAMELTEON 8 MG PO TABS
8.0000 mg | ORAL_TABLET | Freq: Every day | ORAL | Status: DC
  Administered 2017-10-09 – 2017-10-28 (×20): 8 mg via ORAL
  Filled 2017-10-09 (×20): qty 1

## 2017-10-09 MED ORDER — PROCHLORPERAZINE MALEATE 5 MG PO TABS
5.0000 mg | ORAL_TABLET | Freq: Four times a day (QID) | ORAL | Status: DC | PRN
  Administered 2017-10-12: 10 mg via ORAL
  Administered 2017-10-13: 5 mg via ORAL
  Administered 2017-10-20 – 2017-10-22 (×2): 10 mg via ORAL
  Filled 2017-10-09: qty 2
  Filled 2017-10-09: qty 1
  Filled 2017-10-09 (×3): qty 2

## 2017-10-09 MED ORDER — HYDRALAZINE HCL 20 MG/ML IJ SOLN
INTRAMUSCULAR | Status: AC
  Filled 2017-10-09: qty 1

## 2017-10-09 MED ORDER — VERAPAMIL HCL ER 120 MG PO TBCR
120.0000 mg | EXTENDED_RELEASE_TABLET | Freq: Every day | ORAL | Status: DC
  Administered 2017-10-09: 120 mg via ORAL
  Filled 2017-10-09: qty 1

## 2017-10-09 MED ORDER — TRAZODONE HCL 50 MG PO TABS
25.0000 mg | ORAL_TABLET | Freq: Every evening | ORAL | Status: DC | PRN
  Administered 2017-10-23 – 2017-10-25 (×3): 50 mg via ORAL
  Filled 2017-10-09 (×3): qty 1

## 2017-10-09 MED ORDER — GUAIFENESIN-DM 100-10 MG/5ML PO SYRP: 5.0000 mL | ORAL_SOLUTION | Freq: Four times a day (QID) | ORAL | Status: DC | PRN

## 2017-10-09 MED ORDER — INSULIN GLARGINE 100 UNIT/ML ~~LOC~~ SOLN
8.0000 [IU] | Freq: Every day | SUBCUTANEOUS | Status: DC
  Administered 2017-10-10: 8 [IU] via SUBCUTANEOUS
  Filled 2017-10-09: qty 0.08

## 2017-10-09 MED ORDER — FENTANYL CITRATE (PF) 100 MCG/2ML IJ SOLN
INTRAMUSCULAR | Status: AC
  Filled 2017-10-09: qty 2

## 2017-10-09 MED ORDER — ACETAMINOPHEN 325 MG PO TABS
650.0000 mg | ORAL_TABLET | Freq: Four times a day (QID) | ORAL | Status: DC | PRN
  Administered 2017-10-11 – 2017-10-25 (×5): 650 mg via ORAL
  Filled 2017-10-09 (×5): qty 2

## 2017-10-09 MED ORDER — VERAPAMIL HCL ER 120 MG PO TBCR
120.0000 mg | EXTENDED_RELEASE_TABLET | Freq: Every day | ORAL | Status: DC
  Administered 2017-10-10 – 2017-10-29 (×20): 120 mg via ORAL
  Filled 2017-10-09 (×20): qty 1

## 2017-10-09 MED ORDER — INSULIN ASPART 100 UNIT/ML ~~LOC~~ SOLN: 0.0000 [IU] | Freq: Three times a day (TID) | SUBCUTANEOUS | 11 refills | Status: DC

## 2017-10-09 MED ORDER — DIPHENHYDRAMINE HCL 50 MG/ML IJ SOLN
INTRAMUSCULAR | Status: AC
  Filled 2017-10-09: qty 1

## 2017-10-09 MED ORDER — ENOXAPARIN SODIUM 40 MG/0.4ML ~~LOC~~ SOLN
40.0000 mg | SUBCUTANEOUS | Status: DC
  Administered 2017-10-09 – 2017-10-28 (×20): 40 mg via SUBCUTANEOUS
  Filled 2017-10-09 (×20): qty 0.4

## 2017-10-09 MED ORDER — PROCHLORPERAZINE EDISYLATE 10 MG/2ML IJ SOLN: 5.0000 mg | Freq: Four times a day (QID) | INTRAMUSCULAR | Status: DC | PRN

## 2017-10-09 MED ORDER — FLEET ENEMA 7-19 GM/118ML RE ENEM: 1.0000 | ENEMA | Freq: Once | RECTAL | Status: DC | PRN

## 2017-10-09 MED ORDER — ACETAMINOPHEN 325 MG PO TABS: 325.0000 mg | ORAL_TABLET | ORAL | Status: DC | PRN

## 2017-10-09 MED ORDER — MIDAZOLAM HCL 10 MG/2ML IJ SOLN
INTRAMUSCULAR | Status: DC | PRN
  Administered 2017-10-09 (×2): 2 mg via INTRAVENOUS

## 2017-10-09 MED ORDER — SODIUM CHLORIDE 0.9 % IV SOLN
INTRAVENOUS | Status: DC
  Administered 2017-10-09: 15:00:00 via INTRAVENOUS

## 2017-10-09 MED ORDER — DICLOFENAC SODIUM 1 % TD GEL
2.0000 g | Freq: Four times a day (QID) | TRANSDERMAL | Status: DC
  Administered 2017-10-09 – 2017-10-29 (×24): 2 g via TOPICAL
  Filled 2017-10-09: qty 100

## 2017-10-09 MED ORDER — BUTAMBEN-TETRACAINE-BENZOCAINE 2-2-14 % EX AERO
INHALATION_SPRAY | CUTANEOUS | Status: DC | PRN
  Administered 2017-10-09: 2 via TOPICAL

## 2017-10-09 MED ORDER — DIPHENHYDRAMINE HCL 12.5 MG/5ML PO ELIX: 12.5000 mg | ORAL_SOLUTION | Freq: Four times a day (QID) | ORAL | Status: DC | PRN

## 2017-10-09 MED ORDER — ATORVASTATIN CALCIUM 80 MG PO TABS
80.0000 mg | ORAL_TABLET | Freq: Every day | ORAL | Status: DC
  Administered 2017-10-10 – 2017-10-28 (×19): 80 mg via ORAL
  Filled 2017-10-09 (×19): qty 1

## 2017-10-09 MED ORDER — FENTANYL CITRATE (PF) 100 MCG/2ML IJ SOLN
INTRAMUSCULAR | Status: DC | PRN
  Administered 2017-10-09 (×2): 25 ug via INTRAVENOUS

## 2017-10-09 MED ORDER — MIDAZOLAM HCL 5 MG/ML IJ SOLN
INTRAMUSCULAR | Status: AC
  Filled 2017-10-09: qty 2

## 2017-10-09 MED ORDER — ALUM & MAG HYDROXIDE-SIMETH 200-200-20 MG/5ML PO SUSP: 30.0000 mL | ORAL | Status: DC | PRN

## 2017-10-09 MED ORDER — PROCHLORPERAZINE 25 MG RE SUPP: 12.5000 mg | Freq: Four times a day (QID) | RECTAL | Status: DC | PRN

## 2017-10-09 MED ORDER — INSULIN ASPART 100 UNIT/ML ~~LOC~~ SOLN
0.0000 [IU] | Freq: Three times a day (TID) | SUBCUTANEOUS | Status: DC
  Administered 2017-10-10: 2 [IU] via SUBCUTANEOUS
  Administered 2017-10-10 (×2): 5 [IU] via SUBCUTANEOUS
  Administered 2017-10-11: 7 [IU] via SUBCUTANEOUS
  Administered 2017-10-11: 2 [IU] via SUBCUTANEOUS
  Administered 2017-10-11: 5 [IU] via SUBCUTANEOUS
  Administered 2017-10-12: 3 [IU] via SUBCUTANEOUS
  Administered 2017-10-12: 7 [IU] via SUBCUTANEOUS
  Administered 2017-10-12: 2 [IU] via SUBCUTANEOUS
  Administered 2017-10-13 (×2): 3 [IU] via SUBCUTANEOUS
  Administered 2017-10-13: 1 [IU] via SUBCUTANEOUS
  Administered 2017-10-14: 3 [IU] via SUBCUTANEOUS
  Administered 2017-10-14 (×2): 2 [IU] via SUBCUTANEOUS
  Administered 2017-10-15: 5 [IU] via SUBCUTANEOUS
  Administered 2017-10-15 (×2): 2 [IU] via SUBCUTANEOUS
  Administered 2017-10-16: 3 [IU] via SUBCUTANEOUS
  Administered 2017-10-16: 1 [IU] via SUBCUTANEOUS
  Administered 2017-10-16: 2 [IU] via SUBCUTANEOUS
  Administered 2017-10-17: 3 [IU] via SUBCUTANEOUS
  Administered 2017-10-17: 2 [IU] via SUBCUTANEOUS
  Administered 2017-10-17: 3 [IU] via SUBCUTANEOUS
  Administered 2017-10-18: 2 [IU] via SUBCUTANEOUS
  Administered 2017-10-18: 3 [IU] via SUBCUTANEOUS
  Administered 2017-10-18: 1 [IU] via SUBCUTANEOUS
  Administered 2017-10-19: 2 [IU] via SUBCUTANEOUS
  Administered 2017-10-19: 5 [IU] via SUBCUTANEOUS
  Administered 2017-10-20: 2 [IU] via SUBCUTANEOUS
  Administered 2017-10-20: 1 [IU] via SUBCUTANEOUS
  Administered 2017-10-20: 7 [IU] via SUBCUTANEOUS
  Administered 2017-10-21: 3 [IU] via SUBCUTANEOUS
  Administered 2017-10-21 (×2): 2 [IU] via SUBCUTANEOUS
  Administered 2017-10-22: 9 [IU] via SUBCUTANEOUS
  Administered 2017-10-22: 5 [IU] via SUBCUTANEOUS
  Administered 2017-10-22: 2 [IU] via SUBCUTANEOUS
  Administered 2017-10-23: 3 [IU] via SUBCUTANEOUS
  Administered 2017-10-23: 1 [IU] via SUBCUTANEOUS
  Administered 2017-10-23 – 2017-10-24 (×2): 5 [IU] via SUBCUTANEOUS
  Administered 2017-10-24: 3 [IU] via SUBCUTANEOUS
  Administered 2017-10-24 – 2017-10-25 (×2): 2 [IU] via SUBCUTANEOUS
  Administered 2017-10-25: 3 [IU] via SUBCUTANEOUS
  Administered 2017-10-25: 7 [IU] via SUBCUTANEOUS
  Administered 2017-10-26: 1 [IU] via SUBCUTANEOUS
  Administered 2017-10-26 – 2017-10-27 (×4): 3 [IU] via SUBCUTANEOUS
  Administered 2017-10-27: 2 [IU] via SUBCUTANEOUS
  Administered 2017-10-28: 5 [IU] via SUBCUTANEOUS
  Administered 2017-10-28 – 2017-10-29 (×3): 1 [IU] via SUBCUTANEOUS

## 2017-10-09 MED ORDER — POLYETHYLENE GLYCOL 3350 17 G PO PACK
17.0000 g | PACK | Freq: Every day | ORAL | Status: DC | PRN
  Administered 2017-10-10 – 2017-10-11 (×2): 17 g via ORAL
  Filled 2017-10-09 (×2): qty 1

## 2017-10-09 MED ORDER — INSULIN ASPART PROT & ASPART (70-30 MIX) 100 UNIT/ML PEN: PEN_INJECTOR | SUBCUTANEOUS | 11 refills | Status: DC

## 2017-10-09 MED ORDER — INSULIN ASPART 100 UNIT/ML ~~LOC~~ SOLN
0.0000 [IU] | Freq: Every day | SUBCUTANEOUS | Status: DC
  Administered 2017-10-09 – 2017-10-27 (×8): 2 [IU] via SUBCUTANEOUS
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
## 2017-10-09 NOTE — Progress Notes (Signed)
SLP Cancellation Note  Patient Details Name: Chelsea [name] MRN: [id number] DOB: [birth year]   Cancelled treatment:       Reason Eval/Treat Not Completed: Patient at procedure or test/unavailable. Pt currently NPO for procedure. Will continue efforts to complete assessment of diet tolerance.  Celia B. Quentin [facility], CCC-SLP Speech Language Pathologist [phone]  Shonna Chock 10/09/2017, 9:59 AM

## 2017-10-09 NOTE — Progress Notes (Signed)
Pt admitted to unit at 1745 with family at bedside. RN educated pt and family on safety protocol, rehab process, and what to anticipate with therapies. PT complaining to pain to left foot with Xray scheduled. Call bell within reach, bed alarm in place, and family at bedside. BP remain high with trends being within high range.

## 2017-10-09 NOTE — Progress Notes (Signed)
  Echocardiogram Echocardiogram Transesophageal has been performed.  Chelsea [name] 10/09/2017, 5:08 PM
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
## 2017-10-09 NOTE — Progress Notes (Signed)
   Subjective: Feeling well this morning. A little anxious about TEE. Left foot/ankle pain is better. She was able to walk on it some yesterday. She feels a little off balance when she walks and weaker on the right side (although on physical exam she has left sided weakness). We discussed plans for inpatient rehab after TEE procedure. She would like to get some rest after the procedure.  Objective:  Vital signs in last 24 hours: Vitals:   10/08/17 1934 10/08/17 2336 10/09/17 0339 10/09/17 0733  BP: (!) 172/72 (!) 172/73 (!) 157/77 (!) 190/87  Pulse: (!) 18 99 96 92  Resp: 18 18 18 20   Temp: 98.1 F (36.7 C) 98 F (36.7 C) 98.4 F (36.9 C) 98.2 F (36.8 C)  TempSrc: Oral Oral Oral Oral  SpO2: 97% 99% 100% 99%  Weight:      Height:       Gen: Tired appearing, in bed. CV: RRR, no murmurs, no LEE Pulm: CTAB Neuro:4/5 strength with left arm extension. 5/5 strength with flexion of the left arm. Right arm 5/5 strength.  MSK: left ankle has improved ROM, no swelling/redness/warmth, no tenderness to palpation.   Assessment/Plan:  Principal Problem:   Stroke [facility]) Active Problems:   Type 2 diabetes mellitus with diabetic retinopathy ([place])   Hyperlipidemia associated with type 2 diabetes mellitus ([place])   Hypertension associated with diabetes ([place])   Cerebellar infarct ([place])   Hypokalemia   Stage 3 chronic kidney disease ([place])   Ms. Davidson is a 81yoF with diabetes, osteopenia, HTN, HLD who presents for evaluation of a one week history of weakness, falls, and one day of n/v and brain MRI revealed multiple areas of infarct.   Stroke: NPO for TEE with loop recorder placement this afternoon. Workup thus far has been negative for an embolic source. Risk factor modification with baby aspirin, statin, plavix. Will add back antihypertensive therapies as appropriate. Plan for CIR after TEE.   Left foot pain: Improved with voltaren gel   Nausea/vomiting: Improved. Orthostatics negative.     HTN: Continue home losartan 100mg  daily. Add back home verapamil. Hold home clonidine patch    Diabetes: Lantus 8 units, sensitive sliding scale novolog with meals.   Dispo: Anticipated discharge in approximately 0-1 day(s).   Isabelle Course, MD 10/09/2017, 11:54 AM Pager: [phone]

## 2017-10-09 NOTE — Progress Notes (Signed)
Inpatient Rehabilitation Admissions Coordinator  I met with patient with her daughter, [name], at bedside. Both are in agreement to admit to inpt rehab later today after TEE. I will contact Attending service to make the arrangements. RN CM made aware.  Danne Baxter, RN, MSN Rehab Admissions Coordinator [phone] 10/09/2017 12:37 PM
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
## 2017-10-09 NOTE — Progress Notes (Signed)
STROKE TEAM PROGRESS NOTE   INTERVAL HISTORY Multiple family members are at the bedside.  Pt lying in bed, no distress. Just came back from TEE which was unremarkable. Pt apparently refused loop at this time. Will recommend 30 day monitoring. Pt pending CIR admission.   Vitals:   10/09/17 1645 10/09/17 1652 10/09/17 1700 10/09/17 1702  BP: (!) 186/51 (!) 203/65  (!) 209/64  Pulse: 99 (!) 102 100 (!) 102  Resp: (!) 25 (!) 23 (!) 22 (!) 24  Temp:  99.2 F (37.3 C)    TempSrc:  Oral    SpO2: 100% 97% 100% 100%  Weight:      Height:        CBC:  Recent Labs  Lab 10/06/17 0751  WBC 7.8  NEUTROABS 6.3  HGB 13.3  HCT 43.5  MCV 95.6  PLT 053    Basic Metabolic Panel:  Recent Labs  Lab 10/07/17 0507 10/08/17 0546  NA 142 142  K 3.4* 3.8  CL 107 107  CO2 28 26  GLUCOSE 199* 196*  BUN 17 14  CREATININE 1.50* 1.24*  CALCIUM 7.7* 7.9*   Lipid Panel:     Component Value Date/Time   CHOL 252 (H) 10/07/2017 0507   TRIG 109 10/07/2017 0507   HDL 53 10/07/2017 0507   CHOLHDL 4.8 10/07/2017 0507   VLDL 22 10/07/2017 0507   LDLCALC 177 (H) 10/07/2017 0507   HgbA1c:  Lab Results  Component Value Date   HGBA1C 7.6 (H) 10/07/2017   Urine Drug Screen:     Component Value Date/Time   LABOPIA NEG 10/25/2010 1348   LABOPIA NEG 09/01/2010 1614   COCAINSCRNUR NEG 10/25/2010 1348   LABBENZ NEG 10/25/2010 1348   AMPHETMU NEG 10/25/2010 1348    Alcohol Level No results found for: ETH  IMAGING Ct Angio Head W Or Wo Contrast  Result Date: 10/07/2017 CLINICAL DATA:  Stroke follow-up EXAM: CT ANGIOGRAPHY HEAD AND NECK TECHNIQUE: Multidetector CT imaging of the head and neck was performed using the standard protocol during bolus administration of intravenous contrast. Multiplanar CT image reconstructions and MIPs were obtained to evaluate the vascular anatomy. Carotid stenosis measurements (when applicable) are obtained utilizing NASCET criteria, using the distal internal carotid  diameter as the denominator. CONTRAST:  40mL ISOVUE-370 IOPAMIDOL (ISOVUE-370) INJECTION 76% COMPARISON:  Head CT 10/06/2017 and brain MRI 10/06/2017 FINDINGS: CT HEAD FINDINGS Brain: There is no mass, hemorrhage or extra-axial collection. The size and configuration of the ventricles and extra-axial CSF spaces are normal. Focal hypoattenuation in the right corona radiata corresponds to known recent infarct. There is periventricular hypoattenuation compatible with chronic microvascular disease. Skull: The visualized skull base, calvarium and extracranial soft tissues are normal. Sinuses/Orbits: No fluid levels or advanced mucosal thickening of the visualized paranasal sinuses. No mastoid or middle ear effusion. The orbits are normal. CTA NECK FINDINGS AORTIC ARCH: There is no calcific atherosclerosis of the aortic arch. There is no aneurysm, dissection or hemodynamically significant stenosis of the visualized ascending aorta and aortic arch. Conventional 3 vessel aortic branching pattern. The visualized proximal subclavian arteries are widely patent. RIGHT CAROTID SYSTEM: --Common carotid artery: Widely patent origin without common carotid artery dissection or aneurysm. --Internal carotid artery: No dissection, occlusion or aneurysm. No hemodynamically significant stenosis. Minimal calcific atherosclerosis. --External carotid artery: No acute abnormality. LEFT CAROTID SYSTEM: --Common carotid artery: Widely patent origin without common carotid artery dissection or aneurysm. --Internal carotid artery:No dissection, occlusion or aneurysm. No hemodynamically significant stenosis. --External carotid  artery: No acute abnormality. VERTEBRAL ARTERIES: Left dominant configuration. Both origins are normal. No dissection, occlusion or flow-limiting stenosis to the vertebrobasilar confluence. SKELETON: Heterogeneous right thyroid nodule measures 1.8 cm. OTHER NECK: Normal pharynx, larynx and major salivary glands. No cervical  lymphadenopathy. Unremarkable thyroid gland. UPPER CHEST: No pneumothorax or pleural effusion. No nodules or masses. CTA HEAD FINDINGS ANTERIOR CIRCULATION: --Intracranial internal carotid arteries: Normal. --Anterior cerebral arteries: Normal. Both A1 segments are present. Patent anterior communicating artery. --Middle cerebral arteries: Normal. --Posterior communicating arteries: Absent bilaterally. POSTERIOR CIRCULATION: --Basilar artery: Normal. --Posterior cerebral arteries: Normal. --Superior cerebellar arteries: Normal. --Inferior cerebellar arteries: Normal anterior and posterior inferior cerebellar arteries. VENOUS SINUSES: As permitted by contrast timing, patent. ANATOMIC VARIANTS: None DELAYED PHASE: No parenchymal contrast enhancement. Review of the MIP images confirms the above findings. IMPRESSION: 1. No emergent large vessel occlusion or high-grade stenosis. 2. Minimal right carotid bifurcation calcific atherosclerosis, but no other vascular abnormality of the head and neck. 3. Heterogeneous right thyroid nodule measuring 1.8 cm. Correlation with dedicated nonemergent thyroid ultrasound is recommended. 4. Chronic ischemic microangiopathy with multiple known small vessel infarcts. No hemorrhage. Electronically Signed   By: Ulyses Jarred M.D.   On: 10/07/2017 14:24   Ct Head Wo Contrast  Result Date: 10/06/2017 CLINICAL DATA:  Dizziness starting this morning.  Fall this morning. EXAM: CT HEAD WITHOUT CONTRAST TECHNIQUE: Contiguous axial images were obtained from the base of the skull through the vertex without intravenous contrast. COMPARISON:  Report from 11/04/2002 FINDINGS: Brain: Small remote lacunar infarcts peripherally in the right cerebellum and in the right periventricular white matter. Small lacunar infarct versus dilated perivascular space in the left lentiform nucleus on image 15/3. Small chronic lacunar infarct in the left thalamus, image 16/3. Periventricular white matter and corona  radiata hypodensities favor chronic ischemic microvascular white matter disease. No intracranial hemorrhage, mass lesion, or acute CVA. Vascular: There is atherosclerotic calcification of the cavernous carotid arteries bilaterally. Skull: Unremarkable Sinuses/Orbits: Mucous retention cyst in left maxillary sinus. Other: No supplemental non-categorized findings. IMPRESSION: 1. No acute intracranial findings. 2. Periventricular white matter and corona radiata hypodensities favor chronic ischemic microvascular white matter disease. 3. Suspected small remote lacunar infarcts in the right cerebellum, right periventricular white matter, left thalamus, and left lentiform nucleus. 4. Mucous retention cyst in left maxillary sinus. Electronically Signed   By: Van Clines M.D.   On: 10/06/2017 11:01   Ct Angio Neck W Or Wo Contrast  Result Date: 10/07/2017 CLINICAL DATA:  Stroke follow-up EXAM: CT ANGIOGRAPHY HEAD AND NECK TECHNIQUE: Multidetector CT imaging of the head and neck was performed using the standard protocol during bolus administration of intravenous contrast. Multiplanar CT image reconstructions and MIPs were obtained to evaluate the vascular anatomy. Carotid stenosis measurements (when applicable) are obtained utilizing NASCET criteria, using the distal internal carotid diameter as the denominator. CONTRAST:  27mL ISOVUE-370 IOPAMIDOL (ISOVUE-370) INJECTION 76% COMPARISON:  Head CT 10/06/2017 and brain MRI 10/06/2017 FINDINGS: CT HEAD FINDINGS Brain: There is no mass, hemorrhage or extra-axial collection. The size and configuration of the ventricles and extra-axial CSF spaces are normal. Focal hypoattenuation in the right corona radiata corresponds to known recent infarct. There is periventricular hypoattenuation compatible with chronic microvascular disease. Skull: The visualized skull base, calvarium and extracranial soft tissues are normal. Sinuses/Orbits: No fluid levels or advanced mucosal  thickening of the visualized paranasal sinuses. No mastoid or middle ear effusion. The orbits are normal. CTA NECK FINDINGS AORTIC ARCH: There is no calcific atherosclerosis of  the aortic arch. There is no aneurysm, dissection or hemodynamically significant stenosis of the visualized ascending aorta and aortic arch. Conventional 3 vessel aortic branching pattern. The visualized proximal subclavian arteries are widely patent. RIGHT CAROTID SYSTEM: --Common carotid artery: Widely patent origin without common carotid artery dissection or aneurysm. --Internal carotid artery: No dissection, occlusion or aneurysm. No hemodynamically significant stenosis. Minimal calcific atherosclerosis. --External carotid artery: No acute abnormality. LEFT CAROTID SYSTEM: --Common carotid artery: Widely patent origin without common carotid artery dissection or aneurysm. --Internal carotid artery:No dissection, occlusion or aneurysm. No hemodynamically significant stenosis. --External carotid artery: No acute abnormality. VERTEBRAL ARTERIES: Left dominant configuration. Both origins are normal. No dissection, occlusion or flow-limiting stenosis to the vertebrobasilar confluence. SKELETON: Heterogeneous right thyroid nodule measures 1.8 cm. OTHER NECK: Normal pharynx, larynx and major salivary glands. No cervical lymphadenopathy. Unremarkable thyroid gland. UPPER CHEST: No pneumothorax or pleural effusion. No nodules or masses. CTA HEAD FINDINGS ANTERIOR CIRCULATION: --Intracranial internal carotid arteries: Normal. --Anterior cerebral arteries: Normal. Both A1 segments are present. Patent anterior communicating artery. --Middle cerebral arteries: Normal. --Posterior communicating arteries: Absent bilaterally. POSTERIOR CIRCULATION: --Basilar artery: Normal. --Posterior cerebral arteries: Normal. --Superior cerebellar arteries: Normal. --Inferior cerebellar arteries: Normal anterior and posterior inferior cerebellar arteries. VENOUS  SINUSES: As permitted by contrast timing, patent. ANATOMIC VARIANTS: None DELAYED PHASE: No parenchymal contrast enhancement. Review of the MIP images confirms the above findings. IMPRESSION: 1. No emergent large vessel occlusion or high-grade stenosis. 2. Minimal right carotid bifurcation calcific atherosclerosis, but no other vascular abnormality of the head and neck. 3. Heterogeneous right thyroid nodule measuring 1.8 cm. Correlation with dedicated nonemergent thyroid ultrasound is recommended. 4. Chronic ischemic microangiopathy with multiple known small vessel infarcts. No hemorrhage. Electronically Signed   By: Ulyses Jarred M.D.   On: 10/07/2017 14:24   Mr Brain Wo Contrast  Result Date: 10/06/2017 CLINICAL DATA:  Generalized weakness. Nausea and vomiting beginning last night. Fell from bed this morning. EXAM: MRI HEAD WITHOUT CONTRAST TECHNIQUE: Multiplanar, multiecho pulse sequences of the brain and surrounding structures were obtained without intravenous contrast. COMPARISON:  Head CT same day FINDINGS: Brain: Diffusion imaging shows a 3-4 mm acute infarction the middle cerebellar peduncle on right. There is a subcentimeter acute infarction in the right external capsule/radiating white matter tracts. There is a 3 mm acute infarction in the left splenium of the corpus callosum. There is a punctate acute infarction in the left parietal white matter. No acute large vessel territory infarction. Elsewhere, there chronic small vessel infarctions affecting pons, thalami, basal ganglia and hemispheric white matter. No large vessel territory infarction. Few of the old infarctions are associated with hemosiderin deposition. No mass lesion, acute hemorrhage, hydrocephalus or extra-axial collection. Vascular: Major vessels at the base of the brain show flow. Skull and upper cervical spine: Negative Sinuses/Orbits: Clear/normal Other: None IMPRESSION: Background pattern of chronic small vessel ischemic changes  throughout brain. Four acute/subacute small vessel infarctions, 1 the right middle cerebellar peduncle, 1 within the right external capsule/radiating white matter tracts 1 within the left splenium the corpus callosum 1 the left parietal white matter. Electronically Signed   By: Nelson Chimes M.D.   On: 10/06/2017 14:26    2D echocardiogram - Left ventricle: The cavity size was normal. Wall thickness was increased in a pattern of mild LVH. Systolic function was normal. The estimated ejection fraction was in the range of 60% to 65%. Wall motion was normal; there were no regional wall motion abnormalities. The study is not technically sufficient  to allow evaluation of LV diastolic function. - Left atrium: The atrium was mildly dilated. - Inferior vena cava: The vessel was normal in size. The respirophasic diameter changes were in the normal range (>= 50%), consistent with normal central venous pressure. Impressions:  LVEF 60-65%, mild LVH, normal wall motion, mild LAE, normal IVC.  Lower extremity Dopplers negative for deep vein thrombosis. There is no evidence of Baker's cyst bilaterally.  TEE Left Ventrical:  Normal LV function  Mitral Valve: normal  Aortic Valve: normal  Tricuspid Valve: normal  Pulmonic Valve: trivial PI  Left Atrium/ Left atrial appendage: no thrombi seen Atrial septum: no PFO or ASD by color doppler or bubble study.   There was late appearance of bubble contrast - consistent with pulmonary AVMs Aorta: normal    PHYSICAL EXAM HEENT-  Normocephalic, no lesions, without obvious abnormality.  Normal external eye and conjunctiva. Cardiovascular- S1-S2 audible, pulses palpable throughout  Lungs-no rhonchi or wheezing noted, no excessive working breathing.  Saturations within normal limits on RA Extremities- Warm, dry and intact.  Skin-warm and dry  Neurological Examination Mental Status: Alert, oriented to person/age// year/ city/state, thought content  appropriate.  Speech is slow/delayed but fluent without errors of syntax or grammar. Able to name objects and able to follow all commands without difficulty. Slight dysarthria noted. Cranial Nerves: II:  Visual fields grossly normal;  III,IV, VI: ptosis not present, extra-ocular motions intact bilaterally. Pupils equal, round, reactive to light and accommodation. Eyes appear skewed at rest. Denies double vision. V,VII: smile with mild right sided droop, facial light touch sensation normal bilaterally VIII: hearing intact to voice IX,X: Mild hypophonia XI: No asymmetry XII: Midline tongue extension Motor: Right :  Upper extremity   4/5                                      Left:     Upper extremity   5/5             Lower extremity   4/5                                                  Lower extremity   5/5 Right side is weaker than left side Tone and bulk:normal tone throughout; no atrophy noted Sensory: Light touch intact throughout, bilaterally Deep Tendon Reflexes: 2+ and symmetric biceps and patellae Cerebellar: Prominent ataxia with right FNF, mild ataxia with left FNF. Dysmetric RLE Gait: deferred  ASSESSMENT/PLAN Chelsea [name] is a 67 y.o. female with history of HTN, HLD, DB, obesity, etoh abuse w/ withdrawal sz, presenting with HA, blurry vision, light headedness, L HP and 3 falls since Friday (waslks with walker or can at home, no hx falls previously).   Stroke:   Multiple bilateral infarcts appear embolic secondary to unknown source, suspicious for atrial fibrillation   CT head No acute stroke. Small old lacunes R cerebellum, R PVWM, L thalamus, L LN.   MRI  Acute infarcts:  R middle cerebellar peduncle, R external capsule, L splenium/corpus callosum, L parietal WM  CTA head & neck no ELVO.  Minimal right carotid atherosclerosis.  2D Echo  EF 60-65%. No source of embolus   LE doppler negative for DVT  TEE unremarkable  Pt  refused loop at this time, will recommend 30  day cardiac event monitoring to rule out afib as outpt   LDL 177  HgbA1c 7.6  Lovenox 40 mg sq daily for VTE prophylaxis  No antithrombotic prior to admission (stopped by PCP some time ago, treated with aspirin 325 in ED, now on aspirin 81 mg daily and clopidogrel 75 mg daily.  Continue dual antiplatelets x3 weeks then aspirin alone.  Therapy recommendations:  CIR  Disposition: CIR  Hypertension  Stable on the higher side . Permissive hypertension (OK if < 220/120) but gradually normalize in 5-7 days . Long-term BP goal normotensive  Hyperlipidemia  Home meds:  lipitor 80, resumed in hospital  LDL 177, goal < 70  Continue statin at discharge  Diabetes type II  HgbA1c 7.6, goal < 7.0  Uncontrolled  SSI  CBG monitoring  DB RN following   Other Stroke Risk Factors  Advanced age  Remote hx ETOH abuse w/ withdrawal sz. Stopped drinking beer 1 mo ago  UDS / ETOH level not performed   Obesity, Body mass index is 37.59 kg/m., recommend weight loss, diet and exercise as appropriate   Other Active Problems  Nausea and vomiting likely secondary to cerebellar peduncle infarct  Hospital day # 3  Neurology will sign off. Please call with questions. Pt will follow up with stroke clinic NP at [facility] in about 4 weeks. Thanks for the consult.  Rosalin Hawking, MD PhD Stroke Neurology 10/09/2017 5:34 PM   To contact Stroke Continuity provider, please refer to [email or web address] After hours, contact General Neurology

## 2017-10-09 NOTE — Progress Notes (Signed)
Patient's BP elevated, greater than 220s.  Paged internal medicine and received order for IV hydralazine.  Attempted to give via IV in right AC, but IV was not flushing properly.  When tegaderm dressing was removed, IV appeared to be dislodged.  Attempted to advance, but IV was unable to be advanced.  Paged IV team after making two attempts at starting an IV.
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
## 2017-10-09 NOTE — CV Procedure (Signed)
    Transesophageal Echocardiogram Note  Chelsea [name] [id number] [birth year]  Procedure: Transesophageal Echocardiogram Indications: CVA   Procedure Details Consent: Obtained Time Out: Verified patient identification, verified procedure, site/side was marked, verified correct patient position, special equipment/implants available, Radiology Safety Procedures followed,  medications/allergies/relevent history reviewed, required imaging and test results available.  Performed  Medications:  During this procedure the patient is administered a total of Versed  4 mg and Fentanyl 50  mcg  to achieve and maintain moderate conscious sedation.  The patient's heart rate, blood pressure, and oxygen saturation are monitored continuously during the procedure. The period of conscious sedation is 30  minutes, of which I was present face-to-face 100% of this time.  Left Ventrical:  Normal LV function   Mitral Valve: normal   Aortic Valve: normal   Tricuspid Valve: normal   Pulmonic Valve: trivial PI   Left Atrium/ Left atrial appendage: no thrombi seen  Atrial septum: no PFO or ASD by color doppler or bubble study.   There was late appearance of bubble contrast - consistent with pulmonary AVMs  Aorta: normal    Complications: No apparent complications Patient did tolerate procedure well.   Thayer Headings, [name], MD, [facility] 10/09/2017, 4:42 PM

## 2017-10-09 NOTE — Discharge Summary (Signed)
Name: Chelsea [name] MRN: [id number] DOB: 1951/01/20 67 y.o. PCP: Carroll Sage, MD  Date of Admission: 10/06/2017  7:02 AM Date of Discharge: 10/10/17 Attending Physician: Axel Filler, *  Discharge Diagnosis: 1. CVA  Discharge Medications: Allergies as of 10/09/2017      Reactions   Atarax [hydroxyzine]    Hallucination   Lisinopril Cough   Nsaids Nausea And Vomiting   Penicillins Other (See Comments)   shaking      Medication List    STOP taking these medications   Fluoxetine HCl (PMDD) 20 MG Caps   hydrALAZINE 50 MG tablet Commonly known as:  APRESOLINE   hydrochlorothiazide 25 MG tablet Commonly known as:  HYDRODIURIL   loratadine 10 MG tablet Commonly known as:  CLARITIN     TAKE these medications   ACCU-CHEK SOFTCLIX LANCETS lancets Check 3 times a day as instructed.Diagnosis code E11.9   albuterol (2.5 MG/3ML) 0.083% nebulizer solution Commonly known as:  PROVENTIL Take 3 mLs (2.5 mg total) by nebulization every 4 (four) hours as needed for wheezing or shortness of breath. What changed:  Another medication with the same name was removed. Continue taking this medication, and follow the directions you see here.   albuterol 108 (90 Base) MCG/ACT inhaler Commonly known as:  PROVENTIL HFA;VENTOLIN HFA INHALE 2 PUFFS into lungs EVERY 6 HOURS AS NEEDED FOR WHEEZING OR SHORTNESS OF BREATH What changed:  Another medication with the same name was removed. Continue taking this medication, and follow the directions you see here.   aspirin 81 MG EC tablet Take 1 tablet (81 mg total) by mouth daily. Start taking on:  10/10/2017   atorvastatin 80 MG tablet Commonly known as:  LIPITOR TAKE 1 TABLET BY MOUTH EVERY DAY   BLADDER CONTROL PAD REGULAR Misc Use daily as directed   Blood Glucose Monitoring Suppl Kit by Does not apply route.   ACCU-CHEK AVIVA PLUS w/Device Kit Check blood sugar 3 times a day   cetaphil lotion Apply 1 application  topically as needed for dry skin.   cloNIDine 0.3 mg/24hr patch Commonly known as:  CATAPRES - Dosed in mg/24 hr Place 1 patch (0.3 mg total) onto the skin every 7 (seven) days.   clopidogrel 75 MG tablet Commonly known as:  PLAVIX Take 1 tablet (75 mg total) by mouth daily. Start taking on:  10/10/2017   fluticasone 50 MCG/ACT nasal spray Commonly known as:  FLONASE Use 2 sprays in each nostril once daily   glucose blood test strip THREE TIMES DAILY  AS DIRECTED   insulin aspart 100 UNIT/ML injection Commonly known as:  novoLOG Inject 0-9 Units into the skin 3 (three) times daily with meals.   insulin aspart protamine - aspart (70-30) 100 UNIT/ML FlexPen Commonly known as:  NOVOLOG 70/30 MIX Inject 10 units below the skin in the morning and 10 units in the pm What changed:  additional instructions   losartan 100 MG tablet Commonly known as:  COZAAR Take 1 tablet (100 mg total) by mouth daily.   metFORMIN 500 MG 24 hr tablet Commonly known as:  GLUCOPHAGE-XR TAKE 1 TABLET BY MOUTH 2 TIMES DAILY   naphazoline-pheniramine 0.025-0.3 % ophthalmic solution Commonly known as:  NAPHCON-A Place 1 drop into both eyes 4 (four) times daily as needed for irritation.   SURE COMFORT PEN NEEDLES 31G X 8 MM Misc Generic drug:  Insulin Pen Needle USE 1 pen needle 3 TIMES DAILY AS DIRECTED   verapamil 120 MG  CR tablet Commonly known as:  CALAN-SR Take 1 tablet (120 mg total) by mouth daily. What changed:  Another medication with the same name was removed. Continue taking this medication, and follow the directions you see here.   VOLTAREN 1 % Gel Generic drug:  diclofenac sodium Apply 4 g topically 4 (four) times daily. What changed:  See the new instructions.       Disposition and follow-up:   Chelsea [name] was discharged from [facility] in Stable condition.  At the hospital follow up visit please address:  1.  Risk factor modification: HTN, DM, HLD. We  started her on baby aspirin, plavix, and statin. Allowing for permissive HTN. Please resume clonidine patch in 5 days.   2.  Labs / imaging needed at time of follow-up: none  3.  Pending labs/ test needing follow-up: none  Follow-up Appointments: Please go to your already scheduled appt with your primary care doctor Dr. Alfonse Spruce on 10/22/17  Hospital Course by problem list: 1. Chelsea [name] presented to the ED with a one week history of drooling, weakness, falls, and one day of n/v. Code stroke was called on arrival. Head CT was negative and brain MRI revealed four small vessel infarcts with a bilateral distribution. She had nausea and vomiting which was likely secondary to the infarct in the cerebellar peduncle. Neuro deficits included slow speech, slight dysarthria, 4+ left upper and lower extremity strength, mild right sided facial droop, prominent ataxia with right finger to nose and mild ataxia with left finger to nose. Last time normal was one week ago. She was given 35m aspirin. CTA head and neck with minimal right carotid atherosclerosis. Bilateral lower extremity dopplers were negative for DVT. TTE with EF 60-65% and no source of embolus. She underwent a TEE. No thrombi seen in the left atrial appendage. No PFO or ASD. There was evidence of pulmonary AVMs. She was started on baby aspirin and atorvostatin indefinitely and 3 weeks of plavix. Home diabetes and blood pressure medications were continued and she was discharged to inpatient rehab.   2. HLD: LDL 177. Started on atorvostatin 860m3. HTN: discharged on home verapemil, losartan. Can resume clonidine patch in 5 days 4. DM: A1c 7.6. Discharged on home 70/30 insulin 10 units the morning and 10 units at night and metformin  Discharge Vitals:   BP (!) 209/64   Pulse (!) 102   Temp 99.2 F (37.3 C) (Oral)   Resp (!) 24   Ht '5\' 7"'  (1.702 m)   Wt 108.9 kg   SpO2 100%   BMI 37.59 kg/m   Pertinent Labs, Studies, and Procedures:   MRI  brain: Small vessel disease. Acute infarcts:  R middle cerebellar peduncle, R external capsule, L splenium/corpus callosum, L parietal WM Lower extremity USKoreaneg for DVT CTA head and neck: No emergent large vessel occlusion or high-grade stenosis.  2D Echo: EF 60-65%, mild LVH TEE with loop recorder placement. No thrombi seen in the left atrial appendage. No PFO or ASD. There was evidence of pulmonary AVMs   Discharge Instructions: Discharge Instructions    Diet - low sodium heart healthy   Complete by:  As directed    Discharge instructions   Complete by:  As directed    Chelsea [name], Chelsea [name] You were admitted to the hospital because you had a stroke. You will need to start taking a baby aspirin 8117mvery day to help decrease your risk for another stroke. You will  take Aspirn indefinitely. You will also need to take a blood thinner called Plavix for 3 weeks to help decrease your risk of another stroke. We also started you a cholesterol medicine call atorvastatin. It is important for you to stay in touch with your doctor and try to keep your blood pressure, cholesterol, and diabetes under control. Doing these things, will decrease your risk of having another stroke. Please go to your already scheduled clinic appointment on 10/22/17 at 2:30pm. You will meet with Dr. Maudie Mercury first, and then meet with your primary care doctor, Dr. Alfonse Spruce.   If you need anything before then, please call the clinic at [phone]. It was a pleasure taking care of you. I wish you the best at rehab!  -Dr. Donne Hazel   Increase activity slowly   Complete by:  As directed       Signed: Isabelle Course, MD 10/09/2017, 5:11 PM   Pager: [phone]

## 2017-10-09 NOTE — Progress Notes (Signed)
Physical Therapy Treatment Patient Details Name: Chelsea [name] MRN: [id number] DOB: January 24, 1951 Today's Date: 10/09/2017    History of Present Illness Chelsea [name] is an 67 y.o. female with PMH of HTN, HLD, DM2, withdrawal seizures presented to [facility] for evaluation of weakness and falling. MRI revealed 4 small acute/subacute strokes in separate vascular territories (middle cerebral peduncle, external capsule, L spenium of External caplsue, and L parietal white matter.)    PT Comments    Patient seen for mobility progression. This session focused grossly on sitting/standing balance. Pt is able to take side steps at EOB and requires mod/max A (+2 for safety) for all OOB mobility. Pt continues to present with heavy R lateral lean. Continue to recommend CIR for further skilled PT services to maximize independence and safety with mobility.   Follow Up Recommendations  CIR;Supervision/Assistance - 24 hour     Equipment Recommendations  None recommended by PT    Recommendations for Other Services Rehab consult     Precautions / Restrictions Precautions Precautions: Fall Precaution Comments: R lateral lean    Mobility  Bed Mobility Overal bed mobility: Needs Assistance Bed Mobility: Supine to Sit;Sit to Supine     Supine to sit: Min assist Sit to supine: Min guard   General bed mobility comments: assist to elevate trunk into sitting  Transfers Overall transfer level: Needs assistance Equipment used: Rolling walker (2 wheeled) Transfers: Sit to/from Stand Sit to Stand: Mod assist;Max assist;+2 safety/equipment         General transfer comment: mod A to power up into standing and increased assistance required to maintain standing balance due to havey R lateral lean; multimodal cues for midline posture and weigth shifting  Ambulation/Gait Ambulation/Gait assistance: Mod assist;Max assist   Assistive device: Rolling walker (2 wheeled) Gait Pattern/deviations: Step-to  pattern     General Gait Details: pt took side steps up to [facility] (~17ft) with assistance for weight shifting, RW management, and balance; cues for sequencing and safety   Stairs             Wheelchair Mobility    Modified Rankin (Stroke Patients Only) Modified Rankin (Stroke Patients Only) Pre-Morbid Rankin Score: Moderate disability Modified Rankin: Moderately severe disability     Balance Overall balance assessment: Needs assistance   Sitting balance-Leahy Scale: Poor Sitting balance - Comments: leaning to R needing UE support and S   Standing balance support: Bilateral upper extremity supported Standing balance-Leahy Scale: Poor                              Cognition Arousal/Alertness: Awake/alert Behavior During Therapy: WFL for tasks assessed/performed Overall Cognitive Status: Impaired/Different from baseline Area of Impairment: Safety/judgement;Attention;Memory                   Current Attention Level: Sustained Memory: Decreased short-term memory   Safety/Judgement: Decreased awareness of safety;Decreased awareness of deficits            Exercises      General Comments General comments (skin integrity, edema, etc.): pt c/o dizziness with initial sitting EOB but that it did resolve       Pertinent Vitals/Pain Pain Assessment: No/denies pain    Home Living                      Prior Function            PT Goals (current goals can  now be found in the care plan section) Acute Rehab PT Goals PT Goal Formulation: With patient/family Time For Goal Achievement: 10/21/17 Potential to Achieve Goals: Good Progress towards PT goals: Progressing toward goals    Frequency    Min 4X/week      PT Plan Current plan remains appropriate    Co-evaluation              AM-PAC PT "6 Clicks" Daily Activity  Outcome Measure  Difficulty turning over in bed (including adjusting bedclothes, sheets and blankets)?: A  Lot Difficulty moving from lying on back to sitting on the side of the bed? : Unable Difficulty sitting down on and standing up from a chair with arms (e.g., wheelchair, bedside commode, etc,.)?: Unable Help needed moving to and from a bed to chair (including a wheelchair)?: A Lot Help needed walking in hospital room?: A Lot Help needed climbing 3-5 steps with a railing? : Total 6 Click Score: 9    End of Session Equipment Utilized During Treatment: Gait belt Activity Tolerance: Patient tolerated treatment well Patient left: with call bell/phone within reach;in bed;with bed alarm set;with family/visitor present Nurse Communication: Mobility status PT Visit Diagnosis: Other abnormalities of gait and mobility (R26.89);Other symptoms and signs involving the nervous system (R29.898);Dizziness and giddiness (R42)     Time: 1130-1146 PT Time Calculation (min) (ACUTE ONLY): 16 min  Charges:  $Therapeutic Activity: 8-22 mins                     Earney Navy, PTA Pager: [phone]     Darliss Cheney 10/09/2017, 1:28 PM

## 2017-10-09 NOTE — Interval H&P Note (Signed)
History and Physical Interval Note:  10/09/2017 4:29 PM  Chelsea [name]  has presented today for surgery, with the diagnosis of stroke  The various methods of treatment have been discussed with the patient and family. After consideration of risks, benefits and other options for treatment, the patient has consented to  Procedure(s): TRANSESOPHAGEAL ECHOCARDIOGRAM (TEE) (N/A) as a surgical intervention .  The patient's history has been reviewed, patient examined, no change in status, stable for surgery.  I have reviewed the patient's chart and labs.  Questions were answered to the patient's satisfaction.     Mertie Moores

## 2017-10-10 ENCOUNTER — Encounter (HOSPITAL_COMMUNITY): Payer: Self-pay | Admitting: Cardiovascular Disease

## 2017-10-10 ENCOUNTER — Inpatient Hospital Stay (HOSPITAL_COMMUNITY): Payer: Medicare Other | Admitting: Speech Pathology

## 2017-10-10 ENCOUNTER — Inpatient Hospital Stay (HOSPITAL_COMMUNITY): Payer: Medicare Other

## 2017-10-10 ENCOUNTER — Inpatient Hospital Stay (HOSPITAL_COMMUNITY): Payer: Medicare Other | Admitting: Occupational Therapy

## 2017-10-10 DIAGNOSIS — I10 Essential (primary) hypertension: Secondary | ICD-10-CM | POA: Diagnosis not present

## 2017-10-10 DIAGNOSIS — I6349 Cerebral infarction due to embolism of other cerebral artery: Secondary | ICD-10-CM

## 2017-10-10 DIAGNOSIS — S92502S Displaced unspecified fracture of left lesser toe(s), sequela: Secondary | ICD-10-CM

## 2017-10-10 DIAGNOSIS — E669 Obesity, unspecified: Secondary | ICD-10-CM | POA: Diagnosis not present

## 2017-10-10 DIAGNOSIS — E1169 Type 2 diabetes mellitus with other specified complication: Secondary | ICD-10-CM

## 2017-10-10 LAB — CBC WITH DIFFERENTIAL/PLATELET
ABS IMMATURE GRANULOCYTES: 0.1 10*3/uL (ref 0.0–0.1)
Basophils Absolute: 0 10*3/uL (ref 0.0–0.1)
Basophils Relative: 0 %
EOS ABS: 0.2 10*3/uL (ref 0.0–0.7)
EOS PCT: 2 %
HCT: 41.8 % (ref 36.0–46.0)
Hemoglobin: 13.1 g/dL (ref 12.0–15.0)
Immature Granulocytes: 1 %
Lymphocytes Relative: 11 %
Lymphs Abs: 1.4 10*3/uL (ref 0.7–4.0)
MCH: 29.5 pg (ref 26.0–34.0)
MCHC: 31.3 g/dL (ref 30.0–36.0)
MCV: 94.1 fL (ref 78.0–100.0)
Monocytes Absolute: 0.8 10*3/uL (ref 0.1–1.0)
Monocytes Relative: 7 %
Neutro Abs: 9.9 10*3/uL — ABNORMAL HIGH (ref 1.7–7.7)
Neutrophils Relative %: 79 %
PLATELETS: 222 10*3/uL (ref 150–400)
RBC: 4.44 MIL/uL (ref 3.87–5.11)
RDW: 12.3 % (ref 11.5–15.5)
WBC: 12.4 10*3/uL — AB (ref 4.0–10.5)

## 2017-10-10 LAB — COMPREHENSIVE METABOLIC PANEL
ALBUMIN: 2.4 g/dL — AB (ref 3.5–5.0)
ALT: 12 U/L (ref 0–44)
AST: 14 U/L — AB (ref 15–41)
Alkaline Phosphatase: 69 U/L (ref 38–126)
Anion gap: 14 (ref 5–15)
BUN: 14 mg/dL (ref 8–23)
CHLORIDE: 101 mmol/L (ref 98–111)
CO2: 26 mmol/L (ref 22–32)
CREATININE: 1.17 mg/dL — AB (ref 0.44–1.00)
Calcium: 8.1 mg/dL — ABNORMAL LOW (ref 8.9–10.3)
GFR calc Af Amer: 54 mL/min — ABNORMAL LOW (ref >60)
GFR, EST NON AFRICAN AMERICAN: 47 mL/min — AB (ref >60)
Glucose, Bld: 219 mg/dL — ABNORMAL HIGH (ref 70–99)
POTASSIUM: 3.7 mmol/L (ref 3.5–5.1)
Sodium: 141 mmol/L (ref 135–145)
Total Bilirubin: 1 mg/dL (ref 0.3–1.2)
Total Protein: 6.1 g/dL — ABNORMAL LOW (ref 6.5–8.1)

## 2017-10-10 LAB — GLUCOSE, CAPILLARY
GLUCOSE-CAPILLARY: 185 mg/dL — AB (ref 70–99)
GLUCOSE-CAPILLARY: 227 mg/dL — AB (ref 70–99)
Glucose-Capillary: 276 mg/dL — ABNORMAL HIGH (ref 70–99)
Glucose-Capillary: 272 mg/dL — ABNORMAL HIGH (ref 70–99)

## 2017-10-10 MED ORDER — ACETAMINOPHEN 325 MG PO TABS: 650.0000 mg | ORAL_TABLET | Freq: Four times a day (QID) | ORAL | Status: DC | PRN

## 2017-10-10 MED ORDER — INSULIN GLARGINE 100 UNIT/ML ~~LOC~~ SOLN
10.0000 [IU] | Freq: Every day | SUBCUTANEOUS | Status: DC
  Administered 2017-10-11 – 2017-10-15 (×5): 10 [IU] via SUBCUTANEOUS
  Filled 2017-10-10 (×5): qty 0.1
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
## 2017-10-10 NOTE — Plan of Care (Signed)
Nutrition Education Note  RD consulted for nutrition diet education.   Lipid Panel     Component Value Date/Time   CHOL 252 (H) 10/07/2017 0507   TRIG 109 10/07/2017 0507   HDL 53 10/07/2017 0507   CHOLHDL 4.8 10/07/2017 0507   VLDL 22 10/07/2017 0507   LDLCALC 177 (H) 10/07/2017 0507    RD provided "Heart Healthy Consistent Carbohydrate Nutrition Therapy" handout from the Academy of [employer]etics. Reviewed patient's dietary recall. Provided examples on ways to decrease sodium and fat intake in diet. Discouraged intake of processed foods and use of salt shaker. Encouraged fresh fruits and vegetables as well as whole grain sources of carbohydrates to maximize fiber intake. Discussed diabetic friendly drink options. Teach back method used.  Expect good compliance.  Body mass index is 37.43 kg/m. Pt meets criteria for class I obesity based on current BMI.  Current diet order is carbohydrate modified. Pt reports having a good appetite with no other difficulties and has been consuming most of her food at meals. Labs and medications reviewed. Pt currently has Premier Protein ordered and would like to continue with the orders. No further nutrition interventions warranted at this time. RD contact information provided. If additional nutrition issues arise, please re-consult RD.  Corrin Parker, MS, RD, LDN Pager # [phone] After hours/ weekend pager # [phone]
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
## 2017-10-10 NOTE — Plan of Care (Signed)
  Problem: Consults Goal: RH STROKE PATIENT EDUCATION Description See Patient Education module for education specifics  Outcome: Progressing   Problem: RH SKIN INTEGRITY Goal: RH STG SKIN FREE OF INFECTION/BREAKDOWN Description min  Outcome: Progressing   Problem: RH SAFETY Goal: RH STG ADHERE TO SAFETY PRECAUTIONS W/ASSISTANCE/DEVICE Description STG Adhere to Safety Precautions With min Assistance/Device.  Outcome: Progressing   Problem: RH PAIN MANAGEMENT Goal: RH STG PAIN MANAGED AT OR BELOW PT'S PAIN GOAL Description 3 or less  Outcome: Progressing   Problem: RH KNOWLEDGE DEFICIT Goal: RH STG INCREASE KNOWLEDGE OF DIABETES Outcome: Progressing Goal: RH STG INCREASE KNOWLEDGE OF HYPERTENSION Outcome: Progressing Goal: RH STG INCREASE KNOWLEGDE OF HYPERLIPIDEMIA Outcome: Progressing Goal: RH STG INCREASE KNOWLEDGE OF STROKE PROPHYLAXIS Outcome: Progressing   Problem: RH BOWEL ELIMINATION Goal: RH STG MANAGE BOWEL WITH ASSISTANCE Description STG Manage Bowel with min Assistance.  Outcome: Not Progressing   Problem: RH BLADDER ELIMINATION Goal: RH STG MANAGE BLADDER WITH ASSISTANCE Description STG Manage Bladder With min Assistance  Outcome: Not Progressing

## 2017-10-10 NOTE — H&P (Signed)
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 evaluation of this patient and formulated the key components of the plan.  Additionally, I have personally reviewed laboratory data, imaging studies, as well as relevant notes and concur with the physician assistant's documentation above.  Meredith Staggers, MD, Mellody Drown      Bary Leriche, PA-C 10/09/2017

## 2017-10-10 NOTE — Progress Notes (Signed)
Orthopedic Tech Progress Note Patient Details:  Chelsea [name] 1950/11/12 [id number]  Patient ID: [name], female   DOB: [birth year], 67 y.o.   MRN: [id number]   [place] Pink 10/10/2017, 9:41 Thurnell Garbe for Post op Shoe.
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
## 2017-10-11 ENCOUNTER — Inpatient Hospital Stay (HOSPITAL_COMMUNITY): Payer: Medicare Other | Admitting: Occupational Therapy

## 2017-10-11 ENCOUNTER — Inpatient Hospital Stay (HOSPITAL_COMMUNITY): Payer: Medicare Other | Admitting: Physical Therapy

## 2017-10-11 ENCOUNTER — Inpatient Hospital Stay (HOSPITAL_COMMUNITY): Payer: Medicare Other | Admitting: Speech Pathology

## 2017-10-11 ENCOUNTER — Inpatient Hospital Stay (HOSPITAL_COMMUNITY): Payer: Medicare Other

## 2017-10-11 DIAGNOSIS — I6349 Cerebral infarction due to embolism of other cerebral artery: Secondary | ICD-10-CM | POA: Diagnosis not present

## 2017-10-11 DIAGNOSIS — E785 Hyperlipidemia, unspecified: Secondary | ICD-10-CM

## 2017-10-11 DIAGNOSIS — K219 Gastro-esophageal reflux disease without esophagitis: Secondary | ICD-10-CM | POA: Diagnosis not present

## 2017-10-11 DIAGNOSIS — S92502S Displaced unspecified fracture of left lesser toe(s), sequela: Secondary | ICD-10-CM | POA: Diagnosis not present

## 2017-10-11 DIAGNOSIS — E1169 Type 2 diabetes mellitus with other specified complication: Secondary | ICD-10-CM | POA: Diagnosis not present

## 2017-10-11 LAB — GLUCOSE, CAPILLARY
Glucose-Capillary: 188 mg/dL — ABNORMAL HIGH (ref 70–99)
Glucose-Capillary: 288 mg/dL — ABNORMAL HIGH (ref 70–99)
Glucose-Capillary: 304 mg/dL — ABNORMAL HIGH (ref 70–99)
Glucose-Capillary: 207 mg/dL — ABNORMAL HIGH (ref 70–99)

## 2017-10-11 MED ORDER — BLOOD PRESSURE CONTROL BOOK
Freq: Once | Status: AC
  Administered 2017-10-11: 13:00:00
  Filled 2017-10-11: qty 1

## 2017-10-11 MED ORDER — PRO-STAT SUGAR FREE PO LIQD
30.0000 mL | Freq: Three times a day (TID) | ORAL | Status: DC
  Administered 2017-10-11 – 2017-10-28 (×17): 30 mL via ORAL
  Filled 2017-10-11 (×34): qty 30

## 2017-10-11 MED ORDER — LIVING WELL WITH DIABETES BOOK
Freq: Once | Status: AC
  Administered 2017-10-11: 13:00:00
  Filled 2017-10-11: qty 1

## 2017-10-11 NOTE — Progress Notes (Signed)
Physical Therapy Session Note  Patient Details  Name: Chelsea [name] MRN: [id number] Date of Birth: 09/17/1950  Today's Date: 10/11/2017 PT Individual Time: 0830-0925 PT Individual Time Calculation (min): 55 min   Short Term Goals: Week 1:  PT Short Term Goal 1 (Week 1): pt will move supine> sit with supervision PT Short Term Goal 2 (Week 1): pt will sit> stand with min assist PT Short Term Goal 3 (Week 1): pt will transfer w/c>< bed PT Short Term Goal 4 (Week 1): pt will initiate gait training PT Short Term Goal 5 (Week 1): pt will initiate steps  Skilled Therapeutic Interventions/Progress Updates:    pt in bed and agreeable to treatment.  Supine to sit with min A, increased time.  Post op shoe still not delivered, so standing was limited during this session.  Pt c/o dizziness upon coming to sit and again when coming to stand to don pants.  Pt min/mod A for sit to stand with HHA.  Vitals assessed in sitting and standing and BP remains consistent at 171/73 in both positions.  Pt able to tolerate standing for 1 minute before being limited by Rt foot pain.  Attempted squat pivot transfers throughout session, pt with difficulty motor planning despite visual and manual cues.  Pt performs stand pivot with min A.  Seated balance and posture with ball toss and trunk PNFs with multimodal cues for midline.  Rt UE coordination task with clothespins with pt with significant dysmetria and ataxia of Rt UE but able to perform task with increased time. Pt left in chair with needs at hand, alarm set.  Therapy Documentation Precautions:  Precautions Precautions: Fall Precaution Comments: R lateral lean Restrictions Weight Bearing Restrictions: No Other Position/Activity Restrictions: WBAT on L foot Vital Signs: Therapy Vitals Pulse Rate: 94 BP: (!) 171/73 Patient Position (if appropriate): Standing Pain: Pt c/o pain when standing on Rt foot, eases with rest. Post op shoe still not  available   Therapy/Group: Individual Therapy  Areli Jowett 10/11/2017, 9:24 AM

## 2017-10-11 NOTE — Plan of Care (Signed)
  Problem: Consults Goal: RH STROKE PATIENT EDUCATION Description See Patient Education module for education specifics  Outcome: Progressing   Problem: RH BOWEL ELIMINATION Goal: RH STG MANAGE BOWEL WITH ASSISTANCE Description STG Manage Bowel with min Assistance.  Outcome: Progressing Goal: RH STG MANAGE BOWEL W/MEDICATION W/ASSISTANCE Description STG Manage Bowel with Medication with min Assistance.  Outcome: Progressing   Problem: RH BLADDER ELIMINATION Goal: RH STG MANAGE BLADDER WITH ASSISTANCE Description STG Manage Bladder With min Assistance  Outcome: Progressing   Problem: RH SKIN INTEGRITY Goal: RH STG SKIN FREE OF INFECTION/BREAKDOWN Description min  Outcome: Progressing   Problem: RH SAFETY Goal: RH STG ADHERE TO SAFETY PRECAUTIONS W/ASSISTANCE/DEVICE Description STG Adhere to Safety Precautions With min Assistance/Device.  Outcome: Progressing   Problem: RH PAIN MANAGEMENT Goal: RH STG PAIN MANAGED AT OR BELOW PT'S PAIN GOAL Description 3 or less  Outcome: Progressing   Problem: RH KNOWLEDGE DEFICIT Goal: RH STG INCREASE KNOWLEDGE OF DIABETES Outcome: Progressing Goal: RH STG INCREASE KNOWLEDGE OF HYPERTENSION Outcome: Progressing Goal: RH STG INCREASE KNOWLEGDE OF HYPERLIPIDEMIA Outcome: Progressing Goal: RH STG INCREASE KNOWLEDGE OF STROKE PROPHYLAXIS Outcome: Progressing

## 2017-10-11 NOTE — Progress Notes (Signed)
[facility] PHYSICAL MEDICINE & REHABILITATION     PROGRESS NOTE   Late entry  Subjective/Complaints: Had a good night. Left foot still somewhat tender. Hadn't received boot yet when I saw her  ROS: Patient denies fever, rash, sore throat, blurred vision, nausea, vomiting, diarrhea, cough, shortness of breath or chest pain, joint or back pain, headache, or mood change.   Objective:  No results found. Recent Labs    10/10/17 0747  WBC 12.4*  HGB 13.1  HCT 41.8  PLT 222   Recent Labs    10/10/17 0747  NA 141  K 3.7  CL 101  GLUCOSE 219*  BUN 14  CREATININE 1.17*  CALCIUM 8.1*   CBG (last 3)  Recent Labs    10/11/17 1200 10/11/17 1630 10/11/17 2054  GLUCAP 288* 304* 207*    Wt Readings from Last 3 Encounters:  10/09/17 105.2 kg  10/06/17 108.9 kg  10/06/17 109.3 kg     Intake/Output Summary (Last 24 hours) at 10/11/2017 2251 Last data filed at 10/11/2017 1845 Gross per 24 hour  Intake 720 ml  Output 1020 ml  Net -300 ml    Vital Signs: Blood pressure (!) 179/58, pulse 85, temperature 98.2 F (36.8 C), resp. rate 16, height 5\' 6"  (1.676 m), weight 105.2 kg, SpO2 98 %. Physical Exam:  Constitutional: No distress . Vital signs reviewed. HEENT: EOMI, oral membranes moist Neck: supple Cardiovascular: RRR without murmur. No JVD    Respiratory: CTA Bilaterally without wheezes or rales. Normal effort    GI: BS +, non-tender, non-distended  Musculoskeletal: Remains tender at left 5th Metatarsal/phalanx Neurological: She isalertand oriented to person, place, and time.Coordinationabnormal.  Mild dysarthria. Able to follow basic commands without difficulty. RUE 5/5. LUE 4/5 prox to distal. Bilateral LE 3+ HF,KE and 4/5 ADF/PF --essentially unchanged.  Skin: Skin iswarmand dry.  Psychiatric: She has anormal mood and affect. Herbehavior is normal.  Assessment/Plan: 1. Functional deficits secondary to bilateral embolic cerebral infarcts which require  3+ hours per day of interdisciplinary therapy in a comprehensive inpatient rehab setting. Physiatrist is providing close team supervision and 24 hour management of active medical problems listed below. Physiatrist and rehab team continue to assess barriers to discharge/monitor patient progress toward functional and medical goals.  Function:  Bathing Bathing position   Position: Wheelchair/chair at sink  Bathing parts Body parts bathed by patient: Right arm, Left arm, Chest, Abdomen, Front perineal area, Right upper leg, Left upper leg Body parts bathed by helper: Buttocks, Right lower leg, Left lower leg, Back  Bathing assist        Upper Body Dressing/Undressing Upper body dressing   What is the patient wearing?: Pull over shirt/dress     Pull over shirt/dress - Perfomed by patient: Thread/unthread right sleeve, Thread/unthread left sleeve, Put head through opening, Pull shirt over trunk          Upper body assist Assist Level: Supervision or verbal cues      Lower Body Dressing/Undressing Lower body dressing   What is the patient wearing?: Non-skid slipper socks           Non-skid slipper socks- Performed by helper: Don/doff right sock, Don/doff left sock                  Lower body assist        Toileting Toileting   Toileting steps completed by patient: Performs perineal hygiene Toileting steps completed by helper: Adjust clothing prior to toileting, Adjust clothing after  toileting    Toileting assist     Transfers Chair/bed transfer   Chair/bed transfer method: Stand pivot Chair/bed transfer assist level: Maximal assist (Pt 25 - 49%/lift and lower)       Locomotion Ambulation Ambulation activity did not occur: Safety/medical concerns(dizziness upon standing)         Wheelchair   Type: Manual Max wheelchair distance: 10 Assist Level: Maximal assistance (Pt 25 - 49%)  Cognition Comprehension Comprehension assist level: Follows basic  conversation/direction with extra time/assistive device  Expression Expression assist level: Expresses basic needs/ideas: With extra time/assistive device  Social Interaction Social Interaction assist level: Interacts appropriately with others with medication or extra time (anti-anxiety, antidepressant).  Problem Solving Problem solving assist level: Solves basic 90% of the time/requires cueing < 10% of the time  Memory Memory assist level: Recognizes or recalls 75 - 89% of the time/requires cueing 10 - 24% of the time   Medical Problem List and Plan: 1.Functional deficitssecondary tobilateral embolic stroke. -continue therapies 2. DVT Prophylaxis/Anticoagulation: Pharmaceutical:Lovenox 3. Pain Management:Voltaren gel to left foot. -left foot with medial avulsion fx prox 5th phalanx, xray personally reviewed -darco boot ordered, WBAT LLE. 4. Mood:LCSW to follow for evaluation and support. 5. Neuropsych: This patientiscapable of making decisions on herown behalf. 6. Skin/Wound Care:routine pressure relief measures. 7. Fluids/Electrolytes/Nutrition:Monitor I/O. -I personally reviewed the patient's labs today. 8. T2DMwith retinopathy: Hgb A1C-7.6. -titrate lantus upward to 15u qam  -Avoid metformin due to elevated SCr.   9. HTN: Montor BP bid---permissive HTN to prevent hypoperfusion. On Losartan and Verapamilto beresumed,   Continue to hold clonidine.  -fair control at present 10. Dyslipidemia: Continuelipitor.  12. CKD: Baseline SCr 1.3 to 1.5 range in the past 6 months. 13. Dyspnea- new onset:   -PT addressing activity tolerance   LOS (Days) 2 A FACE TO FACE EVALUATION WAS PERFORMED  Meredith Staggers, MD 10/11/2017 10:51 PM

## 2017-10-11 NOTE — Care Management (Signed)
[facility] Individual Statement of Services  Patient Name:  Chelsea [name]  Date:  10/11/2017  Welcome to the [facility].  Our goal is to provide you with an individualized program based on your diagnosis and situation, designed to meet your specific needs.  With this comprehensive rehabilitation program, you will be expected to participate in at least 3 hours of rehabilitation therapies Monday-Friday, with modified therapy programming on the weekends.  Your rehabilitation program will include the following services:  Physical Therapy (PT), Occupational Therapy (OT), Speech Therapy (ST), 24 hour per day rehabilitation nursing, Therapeutic Recreaction (TR), Neuropsychology, Case Management (Social Worker), Rehabilitation Medicine, Nutrition Services and Pharmacy Services  Weekly team conferences will be held on Tuesdays to discuss your progress.  Your Social Worker will talk with you frequently to get your input and to update you on team discussions.  Team conferences with you and your family in attendance may also be held.  Expected length of stay: 12-14 days   Overall anticipated outcome: supervision  Depending on your progress and recovery, your program may change. Your Social Worker will coordinate services and will keep you informed of any changes. Your Social Worker's name and contact numbers are listed  below.  The following services may also be recommended but are not provided by the [facility] will be made to provide these services after discharge if needed.  Arrangements include referral to agencies that provide these services.  Your insurance has been verified to be:  Medicare (part B) and [place] Your primary doctor is:  Nita Sickle  Pertinent information will be shared with your doctor and your  insurance company.  Social Worker:  Rosa Sanchez, [place] or [phone]   Information discussed with and copy given to patient by: Lennart Pall, 10/11/2017, 4:15 PM

## 2017-10-11 NOTE — Progress Notes (Addendum)
Occupational Therapy Session Note  Patient Details  Name: Chelsea [name] MRN: [id number] Date of Birth: [birth year]  Today's Date: 10/11/2017 OT Concurrent Time: 1100-1200 OT Concurrent Time Calculation (min): 60 min   Short Term Goals: Week 1:  OT Short Term Goal 1 (Week 1): Pt will be able to sit to stand with S and maintain standing balance with min A during toileting tasks. OT Short Term Goal 2 (Week 1): Pt will toilet with min A. OT Short Term Goal 3 (Week 1): Pt will don underwear with min A. OT Short Term Goal 4 (Week 1): Pt will be able to use RUE in bimanual task of folding towels with min A demonstrating improved coordination.  Skilled Therapeutic Interventions/Progress Updates:    1:1 Pt participates in making no sew blanket in seated in w/c d/t pain in foot/not received offloading shoe for standing. Pt with no pain while sitting. Focus of activity on endurance, BUE coordination and [place]. Pt unable to use scissors efficiently and becomes easily frustrated with attempting to cut strips. Pt able to tie knots with blanket fleece with increased time often demoing grip slip on fabric d/t decreased strength. Pt requires frequent rest breaks d/t fatigue. Exited sesison wiht pt seated in w/c, set up with lunch and call light in reach  Therapy Documentation Precautions:  Precautions Precautions: Fall Precaution Comments: R lateral lean Restrictions Weight Bearing Restrictions: No Other Position/Activity Restrictions: WBAT on L foot  See Function Navigator for Current Functional Status.   Therapy/Group: concurrent  Tonny Branch 10/11/2017, 12:14 PM

## 2017-10-11 NOTE — Progress Notes (Signed)
Occupational Therapy Session Note  Patient Details  Name: Chelsea [name] MRN: [id number] Date of Birth: 02-29-1950  Today's Date: 10/11/2017 OT Individual Time: 1004-1100 OT Individual Time Calculation (min): 56 min   Short Term Goals: Week 1:  OT Short Term Goal 1 (Week 1): Pt will be able to sit to stand with S and maintain standing balance with min A during toileting tasks. OT Short Term Goal 2 (Week 1): Pt will toilet with min A. OT Short Term Goal 3 (Week 1): Pt will don underwear with min A. OT Short Term Goal 4 (Week 1): Pt will be able to use RUE in bimanual task of folding towels with min A demonstrating improved coordination.  Skilled Therapeutic Interventions/Progress Updates:    Pt greeted seated in wc, reported feeling tired, but agreeable to OT treatment session. Pt also reports LLE pain with weight bearing. UB strengthening with SciFit arm bike on level 1 for 5 mins x2 with extended rest break in between. Graded peg board task using smaller pegs with grips with focus on in-hand manipulation and coordination. Provided pt with medium soft red theraputty. Pt completed theraputty exercises with focus on weight bearing for NMR, grip strength, pinch strength, and coordination. Pt left seated at table in dayroom for handoff to next OT group session.   Therapy Documentation Precautions:  Precautions Precautions: Fall Precaution Comments: R lateral lean Restrictions Weight Bearing Restrictions: No Other Position/Activity Restrictions: WBAT on L foot Pain: Pain Assessment Pain Scale: 0-10 Pain Score: 0-No pain - dd not stand, pt reports increased pain when she stands ADL: ADL ADL Comments: refer to functional navigator  See Function Navigator for Current Functional Status.  Therapy/Group: Individual Therapy  [name] 10/11/2017, 10:23 AM

## 2017-10-11 NOTE — Progress Notes (Signed)
Speech Language Pathology Daily Session Note  Patient Details  Name: Chelsea [name] MRN: [id number] Date of Birth: 05-28-1950  Today's Date: 10/11/2017 SLP Individual Time: 1300-1330 SLP Individual Time Calculation (min): 30 min  Short Term Goals: Week 1: SLP Short Term Goal 1 (Week 1): Patient will demonstrate functional problem solving for mildly complex but familiar tasks with Min A verbal cues.  SLP Short Term Goal 2 (Week 1): Patient will recall new, daily infomration with supervision verbal cues for use of compensatory strategies.  SLP Short Term Goal 3 (Week 1): Patient will self-monitor and correct errors during functional tasks with Min A verbal cues.  SLP Short Term Goal 4 (Week 1): Patient will consume current diet with minimal overt s/s of aspiration and Mod I for use of swallowing compensatory strategies.   Skilled Therapeutic Interventions: Skilled treatment session focused on cognitive goals. SLP facilitated session by providing extra time for patient to complete basic money management tasks with Mod I. Patient also completed a mildly complex medication management task with Min A verbal cues for problem solving. Patient independently recalled the function of her medications that she was taking at home but required total A to recall the functions of her new medications since admission. Patient left upright in wheelchair with all needs within reach. Continue with current plan of care.     Function:   Cognition Comprehension Comprehension assist level: Follows basic conversation/direction with extra time/assistive device  Expression   Expression assist level: Expresses basic needs/ideas: With extra time/assistive device  Social Interaction Social Interaction assist level: Interacts appropriately with others with medication or extra time (anti-anxiety, antidepressant).  Problem Solving Problem solving assist level: Solves basic 90% of the time/requires cueing < 10% of the time   Memory Memory assist level: Recognizes or recalls 75 - 89% of the time/requires cueing 10 - 24% of the time    Pain No/Denies Pain   Therapy/Group: Individual Therapy  [name] 10/11/2017, 2:35 PM

## 2017-10-12 ENCOUNTER — Inpatient Hospital Stay (HOSPITAL_COMMUNITY): Payer: Medicare Other | Admitting: Speech Pathology

## 2017-10-12 ENCOUNTER — Inpatient Hospital Stay (HOSPITAL_COMMUNITY): Payer: Medicare Other | Admitting: Occupational Therapy

## 2017-10-12 ENCOUNTER — Inpatient Hospital Stay (HOSPITAL_COMMUNITY): Payer: Medicare Other

## 2017-10-12 DIAGNOSIS — S92502S Displaced unspecified fracture of left lesser toe(s), sequela: Secondary | ICD-10-CM | POA: Diagnosis not present

## 2017-10-12 DIAGNOSIS — K5901 Slow transit constipation: Secondary | ICD-10-CM | POA: Diagnosis not present

## 2017-10-12 DIAGNOSIS — I6349 Cerebral infarction due to embolism of other cerebral artery: Secondary | ICD-10-CM | POA: Diagnosis not present

## 2017-10-12 DIAGNOSIS — E785 Hyperlipidemia, unspecified: Secondary | ICD-10-CM | POA: Diagnosis not present

## 2017-10-12 DIAGNOSIS — E1169 Type 2 diabetes mellitus with other specified complication: Secondary | ICD-10-CM | POA: Diagnosis not present

## 2017-10-12 LAB — GLUCOSE, CAPILLARY
GLUCOSE-CAPILLARY: 190 mg/dL — AB (ref 70–99)
Glucose-Capillary: 318 mg/dL — ABNORMAL HIGH (ref 70–99)
Glucose-Capillary: 161 mg/dL — ABNORMAL HIGH (ref 70–99)
Glucose-Capillary: 112 mg/dL — ABNORMAL HIGH (ref 70–99)

## 2017-10-12 MED ORDER — SORBITOL 70 % SOLN
60.0000 mL | Status: AC
  Administered 2017-10-12: 60 mL via ORAL
  Filled 2017-10-12: qty 60

## 2017-10-12 NOTE — Progress Notes (Signed)
Occupational Therapy Session Note  Patient Details  Name: Chelsea [name] MRN: [id number] Date of Birth: Mar 09, 1950  Today's Date: 10/12/2017 OT Individual Time: 0700-0757 OT Individual Time Calculation (min): 57 min    Short Term Goals: Week 1:  OT Short Term Goal 1 (Week 1): Pt will be able to sit to stand with S and maintain standing balance with min A during toileting tasks. OT Short Term Goal 2 (Week 1): Pt will toilet with min A. OT Short Term Goal 3 (Week 1): Pt will don underwear with min A. OT Short Term Goal 4 (Week 1): Pt will be able to use RUE in bimanual task of folding towels with min A demonstrating improved coordination.  Skilled Therapeutic Interventions/Progress Updates:    1:1. Pt with no c/o pain except when WB on L foot. Pt not yet received offloading shoe. P reporting need to toilet. Pt complete stand pivot transfer to BSC/w/c with HHA and MIN A sit to stand, however MAX A during pivot d/t desmetria and guarded movement when shifting weight to step onto LLE. Pt requires MAX A for balance when completing hygiene. Pt bathes at sink with supervision for UB/A to wash back only. Pt requires MOD A to stand at sink and OT washes buttocks. Pt issues LHSS to wash B feet with VC for technique. PT able to thread BLE into brief and OT advances patns past hips in standing with MOD A for balance. Pt completes donning dress and sweater with set up. Pt educated on use of reacher and sock aide to doff/don socks. Pt requries mod VC to replicate technique. Exited session with pt seated in chair, all needs in reach and call light in lap.   Therapy Documentation Precautions:  Precautions Precautions: Fall Precaution Comments: R lateral lean Restrictions Weight Bearing Restrictions: No Other Position/Activity Restrictions: WBAT on L foot General:   Vital Signs: Therapy Vitals Temp: 97.6 F (36.4 C) Pulse Rate: 90 Resp: 18 BP: (!) 170/69 Patient Position (if appropriate):  Lying Oxygen Therapy SpO2: 100 % O2 Device: Room Air  See Function Navigator for Current Functional Status.   Therapy/Group: Individual Therapy  Tonny Branch 10/12/2017, 7:25 AM

## 2017-10-12 NOTE — Progress Notes (Signed)
Occupational Therapy Note  Patient Details  Name: LIS SAVITT MRN: [id number] Date of Birth: [birth year]  Today's Date: 10/12/2017 OT Missed Time: 75 Minutes Missed Time Reason: Other (comment);Patient ill (comment)(pt refusal d/t feeling "sick" from treatment of impaction)  Pt supine in bed reporting feeling ill d/t treatment from impaction. Pt missed 75 min skilled OT d/t pt refusal/ill. Will follow up as appropriate per POC   [facility] 10/12/2017, 2:30 PM

## 2017-10-12 NOTE — IPOC Note (Signed)
Overall Plan of Care [facility]) Patient Details Name: Chelsea [name] MRN: [id number] DOB: [birth year]  Admitting Diagnosis: Stroke due to embolism [facility])  Hospital Problems: Principal Problem:   Stroke due to embolism [facility]) Active Problems:   Hyperlipidemia associated with type 2 diabetes mellitus ([place])   GERD (gastroesophageal reflux disease)     Functional Problem List: Nursing Bladder, Bowel, Edema, Endurance, Medication Management, Pain, Safety, Skin Integrity  PT Balance, Edema, Endurance, Motor, Pain  OT Balance, Endurance, Motor, Pain, Sensory  SLP Cognition  TR         Basic ADL's: OT Bathing, Dressing, Toileting, Grooming     Advanced  ADL's: OT       Transfers: PT Bed Mobility, Bed to Chair, Car, Manufacturing systems engineer, Metallurgist: PT Ambulation, Emergency planning/management officer, Stairs     Additional Impairments: OT Fuctional Use of Upper Extremity  SLP Swallowing, Social Cognition   Awareness, Memory, Problem Solving  TR      Anticipated Outcomes Item Anticipated Outcome  Self Feeding I  Swallowing  Mod I   Basic self-care  mod I with dressing, supervision bathing  Toileting  mod I    Bathroom Transfers mod I to toilet, supervision to tub  Bowel/Bladder  Min assist  Transfers  supervision basic; min assist car  Locomotion  supervision gait x 50' in all settings; stairs TBD; supervision w/c x 150' controlled setting, 11' home setting  Communication     Cognition  Min A   Pain  3 or less  Safety/Judgment  min assist   Therapy Plan: PT Intensity: Minimum of 1-2 x/day ,45 to 90 minutes PT Frequency: 5 out of 7 days PT Duration Estimated Length of Stay: 2-2.5 weeks OT Intensity: Minimum of 1-2 x/day, 45 to 90 minutes OT Frequency: 5 out of 7 days OT Duration/Estimated Length of Stay: 12-14 days SLP Intensity: Minumum of 1-2 x/day, 30 to 90 minutes SLP Frequency: 3 to 5 out of 7 days SLP Duration/Estimated Length of Stay: 12-14 days     Team Interventions: Nursing Interventions Patient/Family Education, Skin Care/Wound Management, Disease Management/Prevention, Bladder Management, Pain Management, Bowel Management, Cognitive Remediation/Compensation, Medication Management, Discharge Planning  PT interventions Ambulation/gait training, Community reintegration, DME/adaptive equipment instruction, Neuromuscular re-education, Psychosocial support, Stair training, UE/LE Strength taining/ROM, Wheelchair propulsion/positioning, Training and development officer, Discharge planning, Functional electrical stimulation, Pain management, Therapeutic Activities, UE/LE Coordination activities, Cognitive remediation/compensation, Functional mobility training, Patient/family education, Splinting/orthotics, Therapeutic Exercise, Visual/perceptual remediation/compensation  OT Interventions Balance/vestibular training, DME/adaptive equipment instruction, Discharge planning, Functional mobility training, Neuromuscular re-education, Psychosocial support, Patient/family education, Pain management, Self Care/advanced ADL retraining, UE/LE Strength taining/ROM, Therapeutic Exercise, Therapeutic Activities, UE/LE Coordination activities  SLP Interventions Cognitive remediation/compensation, Environmental controls, Speech/Language facilitation, Therapeutic Activities, Patient/family education, Functional tasks, Cueing hierarchy, Dysphagia/aspiration precaution training  TR Interventions    SW/CM Interventions Discharge Planning, Psychosocial Support, Patient/Family Education   Barriers to Discharge MD  Medical stability  Nursing      PT Decreased caregiver support given pt's hx of falls and current status; supervision is recommended  OT      SLP      SW       Team Discharge Planning: Destination: PT-Home ,OT- Home , SLP-Home Projected Follow-up: PT-24 hour supervision/assistance, Home health PT, OT-  Home health OT, SLP-24 hour supervision/assistance, Home  Health SLP, Outpatient SLP Projected Equipment Needs: PT-To be determined, OT- 3 in 1 bedside comode, Tub/shower bench, SLP-None recommended by SLP Equipment Details: PT-pt owns a RW, OT-  Patient/family involved in discharge planning: PT- Patient,  OT-Patient, SLP-Patient  MD ELOS: 14-15 days Medical Rehab Prognosis:  Excellent Assessment: The patient has been admitted for CIR therapies with the diagnosis of embolic cerebral infarct. The team will be addressing functional mobility, strength, stamina, balance, safety, adaptive techniques and equipment, self-care, bowel and bladder mgt, patient and caregiver education, NMR, cognition, communication, ego support, community reentry. Goals have been set at mod I to supervision with self-care, min assist with bowel and bladder as well as cognition, supervision with mobility.    Meredith Staggers, MD, FAAPMR      See Team Conference Notes for weekly updates to the plan of care

## 2017-10-12 NOTE — Progress Notes (Signed)
SLP Cancellation Note  Patient Details Name: Chelsea [name] MRN: [id number] DOB: 1951/01/03   Cancelled treatment:        Pt missed 30 minutes of skilled ST d/t treatment for impaction.                                                                                                 [name] 10/12/2017, 2:05 PM

## 2017-10-12 NOTE — Progress Notes (Signed)
Occupational Therapy Session Note  Patient Details  Name: Chelsea [name] MRN: [id number] Date of Birth: 03-31-1950  Today's Date: 10/12/2017 OT Individual Time: 1000-1030 OT Individual Time Calculation (min): 30 min    Short Term Goals: Week 1:  OT Short Term Goal 1 (Week 1): Pt will be able to sit to stand with S and maintain standing balance with min A during toileting tasks. OT Short Term Goal 2 (Week 1): Pt will toilet with min A. OT Short Term Goal 3 (Week 1): Pt will don underwear with min A. OT Short Term Goal 4 (Week 1): Pt will be able to use RUE in bimanual task of folding towels with min A demonstrating improved coordination.  Skilled Therapeutic Interventions/Progress Updates:    Pt received in w/c ready for therapy.  Pt taken to gym to work on trunk/ postural control activities.  Pt had a good recall of her previous therapy session.  Pt worked on moving from posterior to anterior pelvic tilt and dynamic reaching to her L.  Pt felt somewhat quesy. Provided pt with gingerale to sip on and then pt felt she needed to toilet.  Pt taken back to room and completed stand pivot to elevated seat over toilet with mod A with cues for hand placement on the bars.  Pt needed A to manage brief but self cleansed. Pt requested to be transferred back to bed.  Completed transfer with mod A. Pt resting in bed with all needs met and alarm set.    Therapy Documentation Precautions:  Precautions Precautions: Fall Precaution Comments: R lateral lean Restrictions Weight Bearing Restrictions: No Other Position/Activity Restrictions: WBAT on L foot    Vital Signs: Therapy Vitals Temp: 97.6 F (36.4 C) Pulse Rate: 90 Resp: 18 BP: (!) 170/69 Patient Position (if appropriate): Lying Oxygen Therapy SpO2: 100 % O2 Device: Room Air   Pain:  no c/o pain  ADL: ADL ADL Comments: refer to functional navigator  See Function Navigator for Current Functional Status.   Therapy/Group: Individual  Therapy  [place] 10/12/2017, 7:56 AM

## 2017-10-12 NOTE — Progress Notes (Signed)
[place] PHYSICAL MEDICINE & REHABILITATION     PROGRESS NOTE  Patient reports difficulty with sleep overnight and nausea.  Nursing her reported decreased urine output.  Has not moved bowels since admission  ROS: Patient denies fever, rash, sore throat, blurred vision,   vomiting, diarrhea, cough, shortness of breath or chest pain, joint or back pain, headache, or mood change.   Objective:  No results found. Recent Labs    10/10/17 0747  WBC 12.4*  HGB 13.1  HCT 41.8  PLT 222   Recent Labs    10/10/17 0747  NA 141  K 3.7  CL 101  GLUCOSE 219*  BUN 14  CREATININE 1.17*  CALCIUM 8.1*   CBG (last 3)  Recent Labs    10/11/17 1630 10/11/17 2054 10/12/17 0641  GLUCAP 304* 207* 190*    Wt Readings from Last 3 Encounters:  10/09/17 105.2 kg  10/06/17 108.9 kg  10/06/17 109.3 kg     Intake/Output Summary (Last 24 hours) at 10/12/2017 0854 Last data filed at 10/11/2017 1845 Gross per 24 hour  Intake 720 ml  Output -  Net 720 ml    Vital Signs: Blood pressure (!) 170/69, pulse 90, temperature 97.6 F (36.4 C), resp. rate 18, height 5\' 6"  (1.676 m), weight 105.2 kg, SpO2 100 %. Physical Exam:  Constitutional: No distress . Vital signs reviewed. HEENT: EOMI, oral membranes moist Neck: supple Cardiovascular: RRR without murmur. No JVD    Respiratory: CTA Bilaterally without wheezes or rales. Normal effort    GI: BS +, non-tender, non-distended  Musculoskeletal: Remains tender at left 5th Metatarsal/phalanx Neurological: She isalertand oriented to person, place, and time.Coordinationabnormal.  Mild dysarthria. Able to follow basic commands without difficulty. RUE 5/5. LUE 4/5 prox to distal. Bilateral LE 3+ HF,KE and 4/5 ADF/PF --stable exam..  Skin: Skin iswarmand dry.  Psychiatric: She has anormal mood and affect. Herbehavior is normal.  Assessment/Plan: 1. Functional deficits secondary to bilateral embolic cerebral infarcts which require 3+  hours per day of interdisciplinary therapy in a comprehensive inpatient rehab setting. Physiatrist is providing close team supervision and 24 hour management of active medical problems listed below. Physiatrist and rehab team continue to assess barriers to discharge/monitor patient progress toward functional and medical goals.  Function:  Bathing Bathing position   Position: Wheelchair/chair at sink  Bathing parts Body parts bathed by patient: Right arm, Left arm, Chest, Abdomen, Front perineal area, Right upper leg, Left upper leg Body parts bathed by helper: Buttocks, Right lower leg, Left lower leg, Back  Bathing assist        Upper Body Dressing/Undressing Upper body dressing   What is the patient wearing?: Pull over shirt/dress     Pull over shirt/dress - Perfomed by patient: Thread/unthread right sleeve, Thread/unthread left sleeve, Put head through opening, Pull shirt over trunk          Upper body assist Assist Level: Supervision or verbal cues      Lower Body Dressing/Undressing Lower body dressing   What is the patient wearing?: Non-skid slipper socks           Non-skid slipper socks- Performed by helper: Don/doff right sock, Don/doff left sock                  Lower body assist        Toileting Toileting   Toileting steps completed by patient: Performs perineal hygiene Toileting steps completed by helper: Adjust clothing prior to toileting, Adjust clothing after  toileting    Toileting assist     Transfers Chair/bed transfer   Chair/bed transfer method: Stand pivot Chair/bed transfer assist level: Maximal assist (Pt 25 - 49%/lift and lower)       Locomotion Ambulation Ambulation activity did not occur: Safety/medical concerns(dizziness upon standing)         Wheelchair   Type: Manual Max wheelchair distance: 10 Assist Level: Maximal assistance (Pt 25 - 49%)  Cognition Comprehension Comprehension assist level: Follows basic  conversation/direction with extra time/assistive device  Expression Expression assist level: Expresses basic needs/ideas: With extra time/assistive device  Social Interaction Social Interaction assist level: Interacts appropriately with others with medication or extra time (anti-anxiety, antidepressant).  Problem Solving Problem solving assist level: Solves basic 90% of the time/requires cueing < 10% of the time  Memory Memory assist level: Recognizes or recalls 75 - 89% of the time/requires cueing 10 - 24% of the time   Medical Problem List and Plan: 1.Functional deficitssecondary tobilateral embolic stroke. -continue therapies 2. DVT Prophylaxis/Anticoagulation: Pharmaceutical:Lovenox 3. Pain Management:Voltaren gel to left foot. -left foot with medial avulsion fx prox 5th phalanx, xray personally reviewed -darco boot ordered, WBAT LLE--apparently wrong size delivered now we are waiting for delivery on Monday. 4. Mood:LCSW to follow for evaluation and support. 5. Neuropsych: This patientiscapable of making decisions on herown behalf. 6. Skin/Wound Care:routine pressure relief measures. 7. Fluids/Electrolytes/Nutrition:Monitor I/O. -I personally reviewed the patient's labs today. 8. T2DMwith retinopathy: Hgb A1C-7.6. -titrated lantus upward to 10u qam on 8/31--observed today  -Avoid metformin due to elevated SCr.   9. HTN: Montor BP bid---permissive HTN to prevent hypoperfusion. On Losartan and Verapamilto beresumed,   Continue to hold clonidine.  -fair control at present 10. Dyslipidemia: Continuelipitor.  12. CKD: Baseline SCr 1.3 to 1.5 range in the past 6 months. 13. Dyspnea- new onset:   -PT addressing activity tolerance 14.  Nausea: Likely related to constipation  -Sorbitol this morning followed by enema or suppository if needed   LOS (Days) [place] EVALUATION WAS  PERFORMED  Meredith Staggers, MD 10/12/2017 8:54 AM

## 2017-10-13 ENCOUNTER — Inpatient Hospital Stay (HOSPITAL_COMMUNITY): Payer: Medicare Other

## 2017-10-13 DIAGNOSIS — E785 Hyperlipidemia, unspecified: Secondary | ICD-10-CM | POA: Diagnosis not present

## 2017-10-13 DIAGNOSIS — I6349 Cerebral infarction due to embolism of other cerebral artery: Secondary | ICD-10-CM | POA: Diagnosis not present

## 2017-10-13 DIAGNOSIS — N179 Acute kidney failure, unspecified: Secondary | ICD-10-CM | POA: Diagnosis not present

## 2017-10-13 DIAGNOSIS — K5900 Constipation, unspecified: Secondary | ICD-10-CM | POA: Diagnosis not present

## 2017-10-13 DIAGNOSIS — I951 Orthostatic hypotension: Secondary | ICD-10-CM | POA: Diagnosis not present

## 2017-10-13 DIAGNOSIS — E1169 Type 2 diabetes mellitus with other specified complication: Secondary | ICD-10-CM | POA: Diagnosis not present

## 2017-10-13 DIAGNOSIS — N183 Chronic kidney disease, stage 3 (moderate): Secondary | ICD-10-CM | POA: Diagnosis not present

## 2017-10-13 DIAGNOSIS — Z7951 Long term (current) use of inhaled steroids: Secondary | ICD-10-CM | POA: Diagnosis not present

## 2017-10-13 DIAGNOSIS — E1122 Type 2 diabetes mellitus with diabetic chronic kidney disease: Secondary | ICD-10-CM | POA: Diagnosis not present

## 2017-10-13 DIAGNOSIS — I129 Hypertensive chronic kidney disease with stage 1 through stage 4 chronic kidney disease, or unspecified chronic kidney disease: Secondary | ICD-10-CM | POA: Diagnosis not present

## 2017-10-13 DIAGNOSIS — J45909 Unspecified asthma, uncomplicated: Secondary | ICD-10-CM | POA: Diagnosis not present

## 2017-10-13 DIAGNOSIS — S92502S Displaced unspecified fracture of left lesser toe(s), sequela: Secondary | ICD-10-CM | POA: Diagnosis not present

## 2017-10-13 DIAGNOSIS — K219 Gastro-esophageal reflux disease without esophagitis: Secondary | ICD-10-CM | POA: Diagnosis not present

## 2017-10-13 DIAGNOSIS — Z79899 Other long term (current) drug therapy: Secondary | ICD-10-CM | POA: Diagnosis not present

## 2017-10-13 DIAGNOSIS — G894 Chronic pain syndrome: Secondary | ICD-10-CM | POA: Diagnosis not present

## 2017-10-13 DIAGNOSIS — E11319 Type 2 diabetes mellitus with unspecified diabetic retinopathy without macular edema: Secondary | ICD-10-CM | POA: Diagnosis not present

## 2017-10-13 DIAGNOSIS — Z833 Family history of diabetes mellitus: Secondary | ICD-10-CM | POA: Diagnosis not present

## 2017-10-13 DIAGNOSIS — K5901 Slow transit constipation: Secondary | ICD-10-CM | POA: Diagnosis not present

## 2017-10-13 DIAGNOSIS — D62 Acute posthemorrhagic anemia: Secondary | ICD-10-CM | POA: Diagnosis not present

## 2017-10-13 DIAGNOSIS — N189 Chronic kidney disease, unspecified: Secondary | ICD-10-CM | POA: Diagnosis not present

## 2017-10-13 DIAGNOSIS — F410 Panic disorder [episodic paroxysmal anxiety] without agoraphobia: Secondary | ICD-10-CM | POA: Diagnosis not present

## 2017-10-13 DIAGNOSIS — Z794 Long term (current) use of insulin: Secondary | ICD-10-CM | POA: Diagnosis not present

## 2017-10-13 DIAGNOSIS — D72829 Elevated white blood cell count, unspecified: Secondary | ICD-10-CM | POA: Diagnosis not present

## 2017-10-13 DIAGNOSIS — I69354 Hemiplegia and hemiparesis following cerebral infarction affecting left non-dominant side: Secondary | ICD-10-CM | POA: Diagnosis not present

## 2017-10-13 LAB — GLUCOSE, CAPILLARY
GLUCOSE-CAPILLARY: 237 mg/dL — AB (ref 70–99)
GLUCOSE-CAPILLARY: 176 mg/dL — AB (ref 70–99)
Glucose-Capillary: 127 mg/dL — ABNORMAL HIGH (ref 70–99)
Glucose-Capillary: 208 mg/dL — ABNORMAL HIGH (ref 70–99)

## 2017-10-13 MED ORDER — SENNOSIDES-DOCUSATE SODIUM 8.6-50 MG PO TABS
2.0000 | ORAL_TABLET | Freq: Every day | ORAL | Status: DC
  Administered 2017-10-13 – 2017-10-25 (×10): 2 via ORAL
  Filled 2017-10-13 (×13): qty 2

## 2017-10-13 NOTE — Progress Notes (Signed)
Occupational Therapy Session Note  Patient Details  Name: Chelsea [name] MRN: [id number] Date of Birth: 05/28/1950  Today's Date: 10/13/2017 OT Individual Time: 0762-2633 OT Individual Time Calculation (min): 58 min    Short Term Goals: Week 1:  OT Short Term Goal 1 (Week 1): Pt will be able to sit to stand with S and maintain standing balance with min A during toileting tasks. OT Short Term Goal 2 (Week 1): Pt will toilet with min A. OT Short Term Goal 3 (Week 1): Pt will don underwear with min A. OT Short Term Goal 4 (Week 1): Pt will be able to use RUE in bimanual task of folding towels with min A demonstrating improved coordination.  Skilled Therapeutic Interventions/Progress Updates:    1:1. Pt received seated in bed with no c/o pian. Pt requesting to change dress. Pt completes supine>sitting with supervision and dons new gown with A to adjust in back. Pt squat pivot transfer with closed chain technqiue/hands placed on rail/arm rest with MOD A and VC for stepping feet. Pt uses same method for transfering to toilet to void urine with A for cothing management only. Pt completes palm<>finger translation of scrabble pieces dropping tiles ~60% of trials palm>finger with VC for stabilizing extra tiles with digit 4/5. Pt stands to play 2 rounds of corn hole with RW for standing with up to MOD A for crossing midline and reaching. Exited session with pt seated in w/c in room with call light in reach awaiting PT>  Therapy Documentation Precautions:  Precautions Precautions: Fall Precaution Comments: R lateral lean Restrictions Weight Bearing Restrictions: No Other Position/Activity Restrictions: WBAT on L foot General:   Vital Signs: Therapy Vitals Temp: 98.7 F (37.1 C) Temp Source: Oral Pulse Rate: 75 Resp: 20 BP: (!) 179/62 Patient Position (if appropriate): Lying Oxygen Therapy SpO2: 99 % O2 Device: Room Air \ See Function Navigator for Current Functional  Status.   Therapy/Group: Individual Therapy  Tonny Branch 10/13/2017, 3:47 PM

## 2017-10-13 NOTE — Progress Notes (Signed)
Physical Therapy Session Note  Patient Details  Name: Chelsea [name] MRN: [id number] Date of Birth: [birth year]  Today's Date: 10/13/2017 PT Individual Time: 7408-1448 PT Individual Time Calculation (min): 45 min  and Today's Date: 10/13/2017 PT Missed Time: 15 Minutes Missed Time Reason: Patient fatigue  Short Term Goals: Week 1:  PT Short Term Goal 1 (Week 1): pt will move supine> sit with supervision PT Short Term Goal 2 (Week 1): pt will sit> stand with min assist PT Short Term Goal 3 (Week 1): pt will transfer w/c>< bed PT Short Term Goal 4 (Week 1): pt will initiate gait training PT Short Term Goal 5 (Week 1): pt will initiate steps  Skilled Therapeutic Interventions/Progress Updates:    Pt seated in w/c upon PT arrival, agreeable to therapy tx and denies pain. Pt transported to the gym. Pt performed sit<>stands x 3 at R rail with mod assist, in standing pt worked on pre-gait stepping forward/backward in place with each LE, mod assist for standing balance. Pt performed x 3 sit<>stands this session with RW and mod assist, pt worked on standing balance with UE support on RW to toss horseshoes, min-mod assist for standing balance. Pt standing with R lateral lean worked on maintaining midline with static standing balance. Pt performed sit<>stand with RW and mod assist, worked on pre-gait stepping forward and backward in place with each LE x 3, increased R lateral lean with verbal/tactile cues to correct. Pt performed sit<>stand with RW and mod assist, this time pt reports increased dizziness and sits back. BP monitored- 166/66. Pt reports now feeling too tired/sick, Pt transported back to room and performed stand pivot back to bed with mod assist. Transferred to supine and left with needs in reach.   Missed 15 minutes of skilled therapy tx secondary to fatigue.     Therapy Documentation Precautions:  Precautions Precautions: Fall Precaution Comments: R lateral lean Restrictions Weight  Bearing Restrictions: No Other Position/Activity Restrictions: WBAT on L foot   See Function Navigator for Current Functional Status.   Therapy/Group: Individual Therapy  Netta Corrigan, PT, DPT  10/13/2017, 1:12 PM

## 2017-10-13 NOTE — Progress Notes (Signed)
PHYSICAL MEDICINE & REHABILITATION     PROGRESS NOTE  Felt better after a bowel movement yesterday   ROS: Patient denies fever, rash, sore throat, blurred vision, nausea, vomiting, diarrhea, cough, shortness of breath or chest pain, joint or back pain, headache, or mood change.    Objective:  No results found. No results for input(s): WBC, HGB, HCT, PLT in the last 72 hours. No results for input(s): NA, K, CL, GLUCOSE, BUN, CREATININE, CALCIUM in the last 72 hours.  Invalid input(s): CO CBG (last 3)  Recent Labs    10/12/17 1642 10/12/17 2109 10/13/17 0639  GLUCAP 161* 112* 127*    Wt Readings from Last 3 Encounters:  10/09/17 105.2 kg  10/06/17 108.9 kg  10/06/17 109.3 kg     Intake/Output Summary (Last 24 hours) at 10/13/2017 9470 Last data filed at 10/12/2017 1800 Gross per 24 hour  Intake 240 ml  Output -  Net 240 ml    Vital Signs: Blood pressure (!) 171/93, pulse 83, temperature 98 F (36.7 C), resp. rate 18, height 5\' 6"  (1.676 m), weight 105.2 kg, SpO2 100 %. Physical Exam:  Constitutional: No distress . Vital signs reviewed. HEENT: EOMI, oral membranes moist Neck: supple Cardiovascular: RRR without murmur. No JVD    Respiratory: CTA Bilaterally without wheezes or rales. Normal effort    GI: BS +, non-tender, non-distended   Musculoskeletal: Remains tender at left 5th Metatarsal/phalanx Neurological: She isalertand oriented to person, place, and time.Coordinationabnormal.  Mild dysarthria. Able to follow basic commands without difficulty. RUE 5/5. LUE 4/5 prox to distal. Bilateral LE 3+ HF,KE and 4/5 ADF/PF -exam stable..  Skin: Skin iswarmand dry.  Psychiatric: Pleasant and cooperative.  Assessment/Plan: 1. Functional deficits secondary to bilateral embolic cerebral infarcts which require 3+ hours per day of interdisciplinary therapy in a comprehensive inpatient rehab setting. Physiatrist is providing close team supervision and 24  hour management of active medical problems listed below. Physiatrist and rehab team continue to assess barriers to discharge/monitor patient progress toward functional and medical goals.  Function:  Bathing Bathing position   Position: Wheelchair/chair at sink  Bathing parts Body parts bathed by patient: Right arm, Left arm, Chest, Abdomen, Front perineal area, Right upper leg, Left upper leg Body parts bathed by helper: Buttocks, Right lower leg, Left lower leg, Back  Bathing assist        Upper Body Dressing/Undressing Upper body dressing   What is the patient wearing?: Pull over shirt/dress     Pull over shirt/dress - Perfomed by patient: Thread/unthread right sleeve, Thread/unthread left sleeve, Put head through opening, Pull shirt over trunk          Upper body assist Assist Level: Supervision or verbal cues      Lower Body Dressing/Undressing Lower body dressing   What is the patient wearing?: Non-skid slipper socks           Non-skid slipper socks- Performed by helper: Don/doff right sock, Don/doff left sock                  Lower body assist        Toileting Toileting   Toileting steps completed by patient: Performs perineal hygiene Toileting steps completed by helper: Adjust clothing prior to toileting, Performs perineal hygiene, Adjust clothing after toileting Toileting Assistive Devices: Grab bar or rail  Toileting assist Assist level: Touching or steadying assistance (Pt.75%)   Transfers Chair/bed transfer   Chair/bed transfer method: Stand pivot Chair/bed transfer assist level:  Maximal assist (Pt 25 - 49%/lift and lower)       Locomotion Ambulation Ambulation activity did not occur: Safety/medical concerns(dizziness upon standing)         Wheelchair   Type: Manual Max wheelchair distance: 10 Assist Level: Maximal assistance (Pt 25 - 49%)  Cognition Comprehension Comprehension assist level: Follows basic conversation/direction with no  assist  Expression Expression assist level: Expresses basic needs/ideas: With no assist  Social Interaction Social Interaction assist level: Interacts appropriately 90% of the time - Needs monitoring or encouragement for participation or interaction.  Problem Solving Problem solving assist level: Solves basic 90% of the time/requires cueing < 10% of the time  Memory Memory assist level: Recognizes or recalls 90% of the time/requires cueing < 10% of the time   Medical Problem List and Plan: 1.Functional deficitssecondary tobilateral embolic stroke. -continue therapies 2. DVT Prophylaxis/Anticoagulation: Pharmaceutical:Lovenox 3. Pain Management:Voltaren gel to left foot. -left foot with medial avulsion fx prox 5th phalanx, xray personally reviewed -darco boot ordered, WBAT LLE--apparently wrong size delivered Friday.  now we are waiting for delivery on Monday. 4. Mood:LCSW to follow for evaluation and support. 5. Neuropsych: This patientiscapable of making decisions on herown behalf. 6. Skin/Wound Care:routine pressure relief measures. 7. Fluids/Electrolytes/Nutrition:Monitor I/O. -Encourage p.o. intake. 8. T2DMwith retinopathy: Hgb A1C-7.6. -titrated lantus upward to 10u qam on 8/31-showed some improvement yesterday in the p.m. and again this morning.  Observe for further pattern  -Avoid metformin due to elevated SCr.   9. HTN: Montor BP bid---permissive HTN to prevent hypoperfusion. On Losartan and Verapamilto beresumed,   Continue to hold clonidine.  -fair control at present 9/1 10. Dyslipidemia: Continuelipitor.  12. CKD: Baseline SCr 1.3 to 1.5 range in the past 6 months. 13. Dyspnea- new onset:   -PT addressing activity tolerance 14.  Nausea: Improved with bowel movements yesterday  -senna-s at HS  LOS (Days) 4 A FACE TO FACE EVALUATION WAS PERFORMED  Meredith Staggers,  MD 10/13/2017 9:22 AM

## 2017-10-14 ENCOUNTER — Inpatient Hospital Stay (HOSPITAL_COMMUNITY): Payer: Medicare Other | Admitting: Occupational Therapy

## 2017-10-14 ENCOUNTER — Inpatient Hospital Stay (HOSPITAL_COMMUNITY): Payer: Medicare Other | Admitting: Physical Therapy

## 2017-10-14 ENCOUNTER — Inpatient Hospital Stay (HOSPITAL_COMMUNITY): Payer: Medicare Other | Admitting: Speech Pathology

## 2017-10-14 DIAGNOSIS — I10 Essential (primary) hypertension: Secondary | ICD-10-CM | POA: Diagnosis not present

## 2017-10-14 DIAGNOSIS — N183 Chronic kidney disease, stage 3 (moderate): Secondary | ICD-10-CM | POA: Diagnosis not present

## 2017-10-14 DIAGNOSIS — E669 Obesity, unspecified: Secondary | ICD-10-CM | POA: Diagnosis not present

## 2017-10-14 DIAGNOSIS — I6349 Cerebral infarction due to embolism of other cerebral artery: Secondary | ICD-10-CM | POA: Diagnosis not present

## 2017-10-14 DIAGNOSIS — G894 Chronic pain syndrome: Secondary | ICD-10-CM | POA: Diagnosis not present

## 2017-10-14 DIAGNOSIS — N179 Acute kidney failure, unspecified: Secondary | ICD-10-CM | POA: Diagnosis not present

## 2017-10-14 DIAGNOSIS — D62 Acute posthemorrhagic anemia: Secondary | ICD-10-CM | POA: Diagnosis not present

## 2017-10-14 DIAGNOSIS — D72829 Elevated white blood cell count, unspecified: Secondary | ICD-10-CM

## 2017-10-14 DIAGNOSIS — E1169 Type 2 diabetes mellitus with other specified complication: Secondary | ICD-10-CM

## 2017-10-14 DIAGNOSIS — F410 Panic disorder [episodic paroxysmal anxiety] without agoraphobia: Secondary | ICD-10-CM | POA: Diagnosis not present

## 2017-10-14 DIAGNOSIS — E785 Hyperlipidemia, unspecified: Secondary | ICD-10-CM | POA: Diagnosis not present

## 2017-10-14 DIAGNOSIS — I69354 Hemiplegia and hemiparesis following cerebral infarction affecting left non-dominant side: Secondary | ICD-10-CM | POA: Diagnosis not present

## 2017-10-14 LAB — GLUCOSE, CAPILLARY
GLUCOSE-CAPILLARY: 182 mg/dL — AB (ref 70–99)
Glucose-Capillary: 212 mg/dL — ABNORMAL HIGH (ref 70–99)
Glucose-Capillary: 174 mg/dL — ABNORMAL HIGH (ref 70–99)
Glucose-Capillary: 211 mg/dL — ABNORMAL HIGH (ref 70–99)

## 2017-10-14 MED ORDER — HYDRALAZINE HCL 10 MG PO TABS
10.0000 mg | ORAL_TABLET | Freq: Three times a day (TID) | ORAL | Status: DC
  Administered 2017-10-14 – 2017-10-15 (×3): 10 mg via ORAL
  Filled 2017-10-14 (×3): qty 1

## 2017-10-14 NOTE — Progress Notes (Signed)
Physical Therapy Session Note  Patient Details  Name: Chelsea [name] MRN: [id number] Date of Birth: 05-08-1950  Today's Date: 10/14/2017 PT Individual Time: 0745-0855 PT Individual Time Calculation (min): 70 min   Short Term Goals: Week 1:  PT Short Term Goal 1 (Week 1): pt will move supine> sit with supervision PT Short Term Goal 2 (Week 1): pt will sit> stand with min assist PT Short Term Goal 3 (Week 1): pt will transfer w/c>< bed PT Short Term Goal 4 (Week 1): pt will initiate gait training PT Short Term Goal 5 (Week 1): pt will initiate steps  Skilled Therapeutic Interventions/Progress Updates:    pt in bed agreeable to therapy. Pt performs supine to sit with supervision.  Stand pivot transfer with min A to w/c.  Pt reports need to use restroom. Pt performs toilet transfer with min A, min A for clothing management.  Attempt scoot pivot transfers vs stand pivot transfers, pt requires min/mod A for scoot pivot due to impaired motor planning.  Seated therex all 2 x 10 LAQ, HS curl, hip add squeeze, hip abd with theraband.  Sit <> stand 2 x 5 with RW and min A.  Pre gait stepping with RW with mod A, pt falls Rt and max A to correct LOB.  Sitting balance with mirror to improve Rt lean with multimodal cues.  Pt continues to c/o feeling "dizzy" when standing, unable to stand long enough to get orthostatic vitals this session.  Therapy Documentation Precautions:  Precautions Precautions: Fall Precaution Comments: R lateral lean Restrictions Weight Bearing Restrictions: No Other Position/Activity Restrictions: WBAT on L foot Vital Signs: Therapy Vitals Temp: 97.8 F (36.6 C) Temp Source: Oral Pulse Rate: 93 Resp: 17 BP: (!) 159/70 Patient Position (if appropriate): Standing Oxygen Therapy SpO2: 100 % O2 Device: Room Air Pain:  no v/o pain   Therapy/Group: Individual Therapy  Amory Zbikowski 10/14/2017, 8:55 AM

## 2017-10-14 NOTE — Progress Notes (Addendum)
Occupational Therapy Session Note  Patient Details  Name: Chelsea [name] MRN: [id number] Date of Birth: [birth year]  Today's Date: 10/14/2017  Short Term Goals: Week 1:  OT Short Term Goal 1 (Week 1): Pt will be able to sit to stand with S and maintain standing balance with min A during toileting tasks. OT Short Term Goal 2 (Week 1): Pt will toilet with min A. OT Short Term Goal 3 (Week 1): Pt will don underwear with min A. OT Short Term Goal 4 (Week 1): Pt will be able to use RUE in bimanual task of folding towels with min A demonstrating improved coordination.  Skilled Therapeutic Interventions/Progress Updates:    1st session:  1210-1240 (30 minutes): Patient fatigued and stated she did not sleep well but briefly worked on sitting endurance activities after eating lunch.  2nd session: 1241-1326 (45 minutes): Patient completed w/c to toilet (3:1) transfer with Min A;     Sit to stand with supervision and maintained balance with Min A for approximately 3 minutes while this clinician assisted with pericleansing, partially due to body girth  Required 3 attempts to stand long enough for pericleansing   toilet transfer back to chair fron 3:1 over toilet=Mod A to maintain balance as patient fatigued very quickly   W/c to bed transfer stand pivot= mod A and extra time due to fatigue  Bed mobility for positioning=Min A  - patient c/o not much rest last night and back to back therapies this morning.   She fatigued very quickly with the toileting tasks (sit to stand x3 and dynamic standing to attempt to wash her periarea and remain standing while clinician donned her brief)   Call bell and phone placed within patient reach at end of session   Therapy Documentation Precautions:  Precautions Precautions: Fall Precaution Comments: R lateral lean Restrictions Weight Bearing Restrictions: No Other Position/Activity Restrictions: WBAT on L foot Pain:denied  See Function Navigator for  Current Functional Status.   Therapy/Group: Individual Therapy  Alfredia Ferguson [facility] 10/14/2017, 1:43 PM

## 2017-10-14 NOTE — Progress Notes (Signed)
[facility]     PROGRESS NOTE  Subjective: Patient seen sitting up in bed this morning. Family at bedside. She states she slept well overnight. She states she had a good weekend except for increased bowel movements on Saturday due to a laxative. She states she had a better day yesterday. She states she is awaiting her boot.  ROS: Denies CP, SOB, N/V/D  Objective:  No results found. No results for input(s): WBC, HGB, HCT, PLT in the last 72 hours. No results for input(s): NA, K, CL, GLUCOSE, BUN, CREATININE, CALCIUM in the last 72 hours.  Invalid input(s): CO CBG (last 3)  Recent Labs    10/13/17 1712 10/13/17 2113 10/14/17 0626  GLUCAP 237* 176* 182*    Wt Readings from Last 3 Encounters:  10/09/17 105.2 kg  10/06/17 108.9 kg  10/06/17 109.3 kg     Intake/Output Summary (Last 24 hours) at 10/14/2017 0906 Last data filed at 10/14/2017 0700 Gross per 24 hour  Intake 480 ml  Output -  Net 480 ml    Vital Signs: Blood pressure (!) 159/70, pulse 93, temperature 97.8 F (36.6 C), resp. rate 17, height 5\' 6"  (1.676 m), weight 105.2 kg, SpO2 100 %. Physical Exam:  Constitutional: No distress . Vital signs reviewed. HENT: Normocephalic.  Atraumatic. Eyes: EOMI. No discharge. Cardiovascular: RRR. No JVD. Respiratory: CTA Bilaterally. Normal effort. GI: BS +. Non-distended. Musc: No edema or tenderness in extremities. Musculoskeletal:TTP left 5th Metatarsal/phalanx Neurological: She isalertand oriented Mild dysarthria Able to follow basic commands without difficulty.  Motor: RUE 5/5.  LUE: 4/5 prox to distal.  Bilateral LE 4-/5 HF,KE and 4/5 ADF/PF Skin: Skin iswarmand dry.  Psychiatric: Pleasant and cooperative.  Assessment/Plan: 1. Functional deficits secondary to bilateral embolic cerebral infarcts which require 3+ hours per day of interdisciplinary therapy in a comprehensive inpatient rehab setting. Physiatrist is  providing close team supervision and 24 hour management of active medical problems listed below. Physiatrist and rehab team continue to assess barriers to discharge/monitor patient progress toward functional and medical goals.  Function:  Bathing Bathing position   Position: Wheelchair/chair at sink  Bathing parts Body parts bathed by patient: Right arm, Left arm, Chest, Abdomen, Front perineal area, Right upper leg, Left upper leg Body parts bathed by helper: Buttocks, Right lower leg, Left lower leg, Back  Bathing assist        Upper Body Dressing/Undressing Upper body dressing   What is the patient wearing?: Pull over shirt/dress     Pull over shirt/dress - Perfomed by patient: Thread/unthread right sleeve, Thread/unthread left sleeve, Put head through opening, Pull shirt over trunk          Upper body assist Assist Level: Supervision or verbal cues      Lower Body Dressing/Undressing Lower body dressing   What is the patient wearing?: Non-skid slipper socks           Non-skid slipper socks- Performed by helper: Don/doff right sock, Don/doff left sock                  Lower body assist        Toileting Toileting   Toileting steps completed by patient: Performs perineal hygiene Toileting steps completed by helper: Adjust clothing prior to toileting, Performs perineal hygiene, Adjust clothing after toileting Toileting Assistive Devices: Grab bar or rail  Toileting assist Assist level: Touching or steadying assistance (Pt.75%)   Transfers Chair/bed transfer   Chair/bed transfer method: Stand pivot  Chair/bed transfer assist level: Maximal assist (Pt 25 - 49%/lift and lower)       Locomotion Ambulation Ambulation activity did not occur: Safety/medical concerns(dizziness upon standing)         Wheelchair   Type: Manual Max wheelchair distance: 10 Assist Level: Maximal assistance (Pt 25 - 49%)  Cognition Comprehension Comprehension assist level:  Understands basic 90% of the time/cues < 10% of the time  Expression Expression assist level: Expresses basic 90% of the time/requires cueing < 10% of the time.  Social Interaction Social Interaction assist level: Interacts appropriately 90% of the time - Needs monitoring or encouragement for participation or interaction.  Problem Solving Problem solving assist level: Solves basic 90% of the time/requires cueing < 10% of the time  Memory Memory assist level: Recognizes or recalls 90% of the time/requires cueing < 10% of the time   Medical Problem List and Plan: 1.Functional deficitssecondary tobilateral embolic stroke. -continue therapies  Notes reviewed - bilateral embolic infarct, images reviewed - bilateral stroke, labs reviewed 2. DVT Prophylaxis/Anticoagulation: Pharmaceutical:Lovenox 3. Pain Management:Voltaren gel to left foot. -left foot with medial avulsion fx prox 5th phalanx - darco boot ordered, WBAT LLE  4. Mood:LCSW to follow for evaluation and support. 5. Neuropsych: This patientiscapable of making decisions on herown behalf. 6. Skin/Wound Care:routine pressure relief measures. 7. Fluids/Electrolytes/Nutrition:Monitor I/O. -Encourage p.o. intake. 8. T2DMwith retinopathy: Hgb A1C-7.6. Lantus 10u qam on 8/31  Avoid metformin due to elevated SCr.    Will consider further increase tomorrow 9. HTN: Montor BP bid  On Losartan and Verapamilto beresumed  Hydralazine 10 3 times a day started on 9/2  Orthostatic hypotension noted on 9/2  Continue to hold clonidine. 10. Dyslipidemia: Continuelipitor.  12. CKD: Baseline SCr 1.3 to 1.5 range in the past 6 months.  Creatinine 1.17 on 8/29 13. Dyspnea- new onset:   -PT addressing activity tolerance 14.  Nausea: Improved with bowel movements  15. Leukocytosis  WBCs 12.4 on 8/29  Afebrile  Continue to monitor  LOS (Days) 5 A FACE TO FACE EVALUATION WAS  PERFORMED  Ankit Lorie Phenix, MD 10/14/2017 9:06 AM

## 2017-10-14 NOTE — Plan of Care (Signed)
  Problem: Consults Goal: RH STROKE PATIENT EDUCATION Description See Patient Education module for education specifics  Outcome: Progressing   Problem: RH BOWEL ELIMINATION Goal: RH STG MANAGE BOWEL WITH ASSISTANCE Description STG Manage Bowel with min Assistance.  Outcome: Progressing Goal: RH STG MANAGE BOWEL W/MEDICATION W/ASSISTANCE Description STG Manage Bowel with Medication with min Assistance.  Outcome: Progressing   Problem: RH BLADDER ELIMINATION Goal: RH STG MANAGE BLADDER WITH ASSISTANCE Description STG Manage Bladder With min Assistance  Outcome: Progressing   Problem: RH SKIN INTEGRITY Goal: RH STG SKIN FREE OF INFECTION/BREAKDOWN Description min  Outcome: Progressing   Problem: RH SAFETY Goal: RH STG ADHERE TO SAFETY PRECAUTIONS W/ASSISTANCE/DEVICE Description STG Adhere to Safety Precautions With min Assistance/Device.  Outcome: Progressing   Problem: RH PAIN MANAGEMENT Goal: RH STG PAIN MANAGED AT OR BELOW PT'S PAIN GOAL Description 3 or less  Outcome: Progressing   Problem: RH KNOWLEDGE DEFICIT Goal: RH STG INCREASE KNOWLEDGE OF DIABETES Outcome: Progressing Goal: RH STG INCREASE KNOWLEDGE OF HYPERTENSION Outcome: Progressing Goal: RH STG INCREASE KNOWLEGDE OF HYPERLIPIDEMIA Outcome: Progressing Goal: RH STG INCREASE KNOWLEDGE OF STROKE PROPHYLAXIS Outcome: Progressing

## 2017-10-14 NOTE — Progress Notes (Signed)
Speech Language Pathology Daily Session Note  Patient Details  Name: Chelsea [name] MRN: [id number] Date of Birth: 20-Feb-1950  Today's Date: 10/14/2017 SLP Individual Time: 1100-1155 SLP Individual Time Calculation (min): 55 min  Short Term Goals: Week 1: SLP Short Term Goal 1 (Week 1): Patient will demonstrate functional problem solving for mildly complex but familiar tasks with Min A verbal cues.  SLP Short Term Goal 2 (Week 1): Patient will recall new, daily infomration with supervision verbal cues for use of compensatory strategies.  SLP Short Term Goal 3 (Week 1): Patient will self-monitor and correct errors during functional tasks with Min A verbal cues.  SLP Short Term Goal 4 (Week 1): Patient will consume current diet with minimal overt s/s of aspiration and Mod I for use of swallowing compensatory strategies.   Skilled Therapeutic Interventions: Skilled treatment session focused on cognitive goals. Upon arrival, patient was upright in the wheelchair but reported she was tired which SLP suspects had an impact on her cognitive functioning today.  SLP facilitated session by providing Min A verbal cues for recall of her current medications and their functions and Max-Total A multimodal cues for problem solving during a mildly-moderately complex task of organizing a QD pill box. Patient also required constant repetition of daily information in regards to time of upcoming therapy appointments. Patient left upright in wheelchair with all needs within reach. Continue with current plan of care.      Function:   Cognition Comprehension Comprehension assist level: Understands basic 90% of the time/cues < 10% of the time  Expression   Expression assist level: Expresses basic 90% of the time/requires cueing < 10% of the time.  Social Interaction Social Interaction assist level: Interacts appropriately 90% of the time - Needs monitoring or encouragement for participation or interaction.  Problem  Solving Problem solving assist level: Solves basic 75 - 89% of the time/requires cueing 10 - 24% of the time  Memory Memory assist level: Recognizes or recalls 75 - 89% of the time/requires cueing 10 - 24% of the time    Pain No/Denies Pain   Therapy/Group: Individual Therapy  Barkley Kratochvil 10/14/2017, 12:04 PM

## 2017-10-15 ENCOUNTER — Inpatient Hospital Stay (HOSPITAL_COMMUNITY): Payer: Medicare Other

## 2017-10-15 ENCOUNTER — Other Ambulatory Visit: Payer: Self-pay | Admitting: Internal Medicine

## 2017-10-15 ENCOUNTER — Inpatient Hospital Stay (HOSPITAL_COMMUNITY): Payer: Medicare Other | Admitting: Speech Pathology

## 2017-10-15 ENCOUNTER — Inpatient Hospital Stay (HOSPITAL_COMMUNITY): Payer: Medicare Other | Admitting: Physical Therapy

## 2017-10-15 DIAGNOSIS — I69354 Hemiplegia and hemiparesis following cerebral infarction affecting left non-dominant side: Secondary | ICD-10-CM | POA: Diagnosis not present

## 2017-10-15 DIAGNOSIS — D72829 Elevated white blood cell count, unspecified: Secondary | ICD-10-CM | POA: Diagnosis not present

## 2017-10-15 DIAGNOSIS — Z794 Long term (current) use of insulin: Principal | ICD-10-CM

## 2017-10-15 DIAGNOSIS — I10 Essential (primary) hypertension: Secondary | ICD-10-CM | POA: Diagnosis not present

## 2017-10-15 DIAGNOSIS — N183 Chronic kidney disease, stage 3 (moderate): Secondary | ICD-10-CM | POA: Diagnosis not present

## 2017-10-15 DIAGNOSIS — F410 Panic disorder [episodic paroxysmal anxiety] without agoraphobia: Secondary | ICD-10-CM | POA: Diagnosis not present

## 2017-10-15 DIAGNOSIS — E669 Obesity, unspecified: Secondary | ICD-10-CM | POA: Diagnosis not present

## 2017-10-15 DIAGNOSIS — E113593 Type 2 diabetes mellitus with proliferative diabetic retinopathy without macular edema, bilateral: Secondary | ICD-10-CM

## 2017-10-15 DIAGNOSIS — N179 Acute kidney failure, unspecified: Secondary | ICD-10-CM | POA: Diagnosis not present

## 2017-10-15 DIAGNOSIS — I6349 Cerebral infarction due to embolism of other cerebral artery: Secondary | ICD-10-CM | POA: Diagnosis not present

## 2017-10-15 DIAGNOSIS — D62 Acute posthemorrhagic anemia: Secondary | ICD-10-CM | POA: Diagnosis not present

## 2017-10-15 DIAGNOSIS — G894 Chronic pain syndrome: Secondary | ICD-10-CM | POA: Diagnosis not present

## 2017-10-15 DIAGNOSIS — E785 Hyperlipidemia, unspecified: Secondary | ICD-10-CM | POA: Diagnosis not present

## 2017-10-15 DIAGNOSIS — E1169 Type 2 diabetes mellitus with other specified complication: Secondary | ICD-10-CM | POA: Diagnosis not present

## 2017-10-15 LAB — GLUCOSE, CAPILLARY
GLUCOSE-CAPILLARY: 196 mg/dL — AB (ref 70–99)
GLUCOSE-CAPILLARY: 284 mg/dL — AB (ref 70–99)
Glucose-Capillary: 169 mg/dL — ABNORMAL HIGH (ref 70–99)
Glucose-Capillary: 146 mg/dL — ABNORMAL HIGH (ref 70–99)

## 2017-10-15 MED ORDER — HYDRALAZINE HCL 25 MG PO TABS
25.0000 mg | ORAL_TABLET | Freq: Three times a day (TID) | ORAL | Status: DC
  Administered 2017-10-15 – 2017-10-29 (×40): 25 mg via ORAL
  Filled 2017-10-15 (×43): qty 1

## 2017-10-15 MED ORDER — INSULIN GLARGINE 100 UNIT/ML ~~LOC~~ SOLN
15.0000 [IU] | Freq: Every day | SUBCUTANEOUS | Status: DC
  Administered 2017-10-16 – 2017-10-17 (×2): 15 [IU] via SUBCUTANEOUS
  Filled 2017-10-15 (×2): qty 0.15

## 2017-10-15 NOTE — Patient Care Conference (Addendum)
Inpatient RehabilitationTeam Conference and Plan of Care Update Date: 10/15/2017   Time: 10:45 AM    Patient Name: Chelsea [name]      Medical Record Number: [id number]  Date of Birth: 07/07/1950 Sex: Female         Room/Bed: 4W13C/4W13C-01 Payor Info: Payor: MEDICARE / Plan: MEDICARE PART B / Product Type: *No Product type* /    Admitting Diagnosis: TEE CVA  Admit Date/Time:  10/09/2017  5:42 PM Admission Comments: No comment available   Primary Diagnosis:  Stroke due to embolism [facility]) Principal Problem: Stroke due to embolism [facility])  Patient Active Problem List   Diagnosis Date Noted  . Benign essential HTN   . Leukocytosis   . Essential hypertension   . Diabetes mellitus type 2 in obese ([place])   . Stroke due to embolism ([place]) 10/09/2017  . Cerebellar infarct ([place])   . Hypokalemia   . Stage 3 chronic kidney disease ([place])   . Stroke ([place]) 10/06/2017  . Split S2 (second heart sound) 08/08/2017  . Stress 07/27/2015  . Lipodermatosclerosis 04/04/2015  . Osteopenia 06/25/2014  . Depression 08/28/2013  . Health care maintenance 08/28/2013  . Severe obesity (BMI >= 40) ([place]) 03/12/2013  . Asthma, chronic 01/30/2013  . Osteoarthritis 11/08/2011  . GERD (gastroesophageal reflux disease) 01/11/2011  . Diabetic foot ([place]) 08/15/2010  . Allergic rhinitis 09/28/2009  . Personal history of colonic polyps 03/17/2008  . DIABETIC  RETINOPATHY 04/09/2006  . CATARACT NOS 04/09/2006  . FIBROIDS, UTERUS 12/26/2005  . Hyperlipidemia associated with type 2 diabetes mellitus ([place]) 12/26/2005  . ABUSE, ALCOHOL, IN REMISSION 12/26/2005  . Hypertension associated with diabetes ([place]) 12/26/2005  . Type 2 diabetes mellitus with diabetic retinopathy ([place]) 02/12/1993    Expected Discharge Date: Expected Discharge Date: 10/29/17  Team Members Present: Physician leading conference: Dr. Delice Lesch Social Worker Present: Lennart Pall, LCSW Nurse Present: Heather Roberts, RN PT Present: Roderic Ovens,  PT OT Present: Other (comment)(Sandra Rosana Hoes, OT) SLP Present: Weston Anna, SLP     Current Status/Progress Goal Weekly Team Focus  Medical   Functional deficits secondary to bilateral embolic stroke.  Improve mobility, safety, DM/HTN, WBCs  See above   Bowel/Bladder   Continent of bowel/bladder with some bladder urgency, LBM 10/13/17  To be able to have normal bowel/bladder function with min assist  Assess and address any bowel/bladder issues   Swallow/Nutrition/ Hydration   Regular textures with thin liquids, intermittent supervision  Mod I  tolerance of diet with use of swallowing strategies    ADL's   min for bathing and dressing, sink level, showering today, mod A stand pivots transfers, total A for toileting   overall mod I goals, shower transfer with supervision  transfers, bathing at shower level, toileting, right UE coordination   Mobility   mod A transfers and pre gait  supervision  NMR, balance, gait   Communication             Safety/Cognition/ Behavioral Observations  Min-Mod A  Supervision  basic problem solving, recall and awareness    Pain   No complain of pain  <2  assess and treat pain q shift and as needed   Skin   Skin tear to left butt with foam dsg  Main skin integrity with min aasist  Assess skin q shift and as needed    Rehab Goals Patient on target to meet rehab goals: Yes *See Care Plan and progress notes for long and short-term goals.  Barriers to Discharge  Current Status/Progress Possible Resolutions Date Resolved   Physician    Medical stability     See above  Therapies, optimize DM/HTN, follow labs      Nursing                  PT                    OT                  SLP                SW                Discharge Planning/Teaching Needs:  Daughter making arrangements for 24/7 assistance for pt in the home via family members.  Need to plan closer to d/c.   Team Discussion:  MD monitor labs;  RN propses need for toileting  schedule.  ST reports odd presentation with cognition and fluctuating abilities.  Mod assist tfs overall and supervision goals.  Not having correct post-op shoe has limited what PT can do - RN to follow up and insist correct shoe to pt today.  Revisions to Treatment Plan:  None    Continued Need for Acute Rehabilitation Level of Care: The patient requires daily medical management by a physician with specialized training in physical medicine and rehabilitation for the following conditions: Daily direction of a multidisciplinary physical rehabilitation program to ensure safe treatment while eliciting the highest outcome that is of practical value to the patient.: Yes Daily medical management of patient stability for increased activity during participation in an intensive rehabilitation regime.: Yes Daily analysis of laboratory values and/or radiology reports with any subsequent need for medication adjustment of medical intervention for : Neurological problems;Diabetes problems;Blood pressure problems;Renal problems;Other   I attest that I was present, lead the team conference, and concur with the assessment and plan of the team. Delice Lesch, MD  Donata Clay, [place] 10/15/2017, 3:49 PM

## 2017-10-15 NOTE — Progress Notes (Signed)
Social Work Patient ID: [name], female   DOB: April 22, 1950, 67 y.o.   MRN: [id number]    Rexene Alberts  Social Worker  General Practice  Patient Care Conference  Addendum  Date of Service:  10/15/2017  3:49 PM            Show:Clear all [x] Manual[x] Template[] Copied  Added by: [x] Lowella Curb, LCSW  [] Hover for details Inpatient RehabilitationTeam Conference and Plan of Care Update Date: 10/15/2017   Time: 10:45 AM      Patient Name: Chelsea [name]      Medical Record Number: [id number]  Date of Birth: 09-24-1950 Sex: Female         Room/Bed: 4W13C/4W13C-01 Payor Info: Payor: MEDICARE / Plan: MEDICARE PART B / Product Type: *No Product type* /     Admitting Diagnosis: TEE CVA  Admit Date/Time:  10/09/2017  5:42 PM Admission Comments: No comment available    Primary Diagnosis:  Stroke due to embolism [facility]) Principal Problem: Stroke due to embolism [facility])       Patient Active Problem List    Diagnosis Date Noted  . Benign essential HTN    . Leukocytosis    . Essential hypertension    . Diabetes mellitus type 2 in obese ([place])    . Stroke due to embolism ([place]) 10/09/2017  . Cerebellar infarct ([place])    . Hypokalemia    . Stage 3 chronic kidney disease ([place])    . Stroke ([place]) 10/06/2017  . Split S2 (second heart sound) 08/08/2017  . Stress 07/27/2015  . Lipodermatosclerosis 04/04/2015  . Osteopenia 06/25/2014  . Depression 08/28/2013  . Health care maintenance 08/28/2013  . Severe obesity (BMI >= 40) ([place]) 03/12/2013  . Asthma, chronic 01/30/2013  . Osteoarthritis 11/08/2011  . GERD (gastroesophageal reflux disease) 01/11/2011  . Diabetic foot ([place]) 08/15/2010  . Allergic rhinitis 09/28/2009  . Personal history of colonic polyps 03/17/2008  . DIABETIC  RETINOPATHY 04/09/2006  . CATARACT NOS 04/09/2006  . FIBROIDS, UTERUS 12/26/2005  . Hyperlipidemia associated with type 2 diabetes mellitus ([place]) 12/26/2005  . ABUSE, ALCOHOL, IN REMISSION 12/26/2005    . Hypertension associated with diabetes ([place]) 12/26/2005  . Type 2 diabetes mellitus with diabetic retinopathy ([place]) 02/12/1993      Expected Discharge Date: Expected Discharge Date: 10/29/17   Team Members Present: Physician leading conference: Dr. Delice Lesch Social Worker Present: Lennart Pall, LCSW Nurse Present: Heather Roberts, RN PT Present: Roderic Ovens, PT OT Present: Other (comment)(Sandra Rosana Hoes, OT) SLP Present: Weston Anna, SLP       Current Status/Progress Goal Weekly Team Focus  Medical     Functional deficits secondary to bilateral embolic stroke.  Improve mobility, safety, DM/HTN, WBCs  See above   Bowel/Bladder     Continent of bowel/bladder with some bladder urgency, LBM 10/13/17  To be able to have normal bowel/bladder function with min assist  Assess and address any bowel/bladder issues   Swallow/Nutrition/ Hydration     Regular textures with thin liquids, intermittent supervision  Mod I  tolerance of diet with use of swallowing strategies    ADL's     min for bathing and dressing, sink level, showering today, mod A stand pivots transfers, total A for toileting   overall mod I goals, shower transfer with supervision  transfers, bathing at shower level, toileting, right UE coordination   Mobility     mod A transfers and pre gait  supervision  NMR, balance, gait   Communication  Safety/Cognition/ Behavioral Observations   Min-Mod A  Supervision  basic problem solving, recall and awareness    Pain     No complain of pain  <2  assess and treat pain q shift and as needed   Skin     Skin tear to left butt with foam dsg  Main skin integrity with min aasist  Assess skin q shift and as needed     Rehab Goals Patient on target to meet rehab goals: Yes *See Care Plan and progress notes for long and short-term goals.      Barriers to Discharge   Current Status/Progress Possible Resolutions Date Resolved   Physician     Medical stability     See  above  Therapies, optimize DM/HTN, follow labs      Nursing                 PT                    OT                 SLP            SW              Discharge Planning/Teaching Needs:  Daughter making arrangements for 24/7 assistance for pt in the home via family members.  Need to plan closer to d/c.   Team Discussion:  MD monitor labs;  RN propses need for toileting schedule.  ST reports odd presentation with cognition and fluctuating abilities.  Mod assist tfs overall and supervision goals.  Not having correct post-op shoe has limited what PT can do - RN to follow up and insist correct shoe to pt today.  Revisions to Treatment Plan:  None    Continued Need for Acute Rehabilitation Level of Care: The patient requires daily medical management by a physician with specialized training in physical medicine and rehabilitation for the following conditions: Daily direction of a multidisciplinary physical rehabilitation program to ensure safe treatment while eliciting the highest outcome that is of practical value to the patient.: Yes Daily medical management of patient stability for increased activity during participation in an intensive rehabilitation regime.: Yes Daily analysis of laboratory values and/or radiology reports with any subsequent need for medication adjustment of medical intervention for : Neurological problems;Diabetes problems;Blood pressure problems;Renal problems;Other     I attest that I was present, lead the team conference, and concur with the assessment and plan of the team. Delice Lesch, MD   Lennart Pall 10/15/2017, 3:49 PM         Revision History

## 2017-10-15 NOTE — Progress Notes (Signed)
Occupational Therapy Session Note  Patient Details  Name: Chelsea [name] MRN: [id number] Date of Birth: Jun 24, 1950  Today's Date: 10/15/2017 OT Individual Time: 0900-1000 Session 2: 1300-1345 OT Individual Time Calculation (min): 60 min Session 2: 45 min   Short Term Goals: Week 1:  OT Short Term Goal 1 (Week 1): Pt will be able to sit to stand with S and maintain standing balance with min A during toileting tasks. OT Short Term Goal 2 (Week 1): Pt will toilet with min A. OT Short Term Goal 3 (Week 1): Pt will don underwear with min A. OT Short Term Goal 4 (Week 1): Pt will be able to use RUE in bimanual task of folding towels with min A demonstrating improved coordination.  Skilled Therapeutic Interventions/Progress Updates:    Pt sitting on toilet upon OT entry to room. Pt completed 5 steps into walk in shower with min A and vc for technique/sequencing. Pt doffed shirt with (S) while sitting on TTB and required max A to doff socks. Facility issue with water pressure so pt transferred back out of shower into w/c with min A with vc for hand placement. From w/c level pt completed IADL task of making bed. Pt completed 10 ft of w/c propulsion with vc for technique with mod A. Pt was transported to therapy gym and completed R UE coordination and [place] activities while seated unsuported. Over/undershooting observed with reduced proximal stability. Vc for visual attention. During stands pt experiencing several LOB and abruptly sitting back in w/c, with edu provided re fall risk reduction. Pt returned to room and returned to supine in bed with bed alarm set and all needs met.   Session 2: Pt supine in bed awaiting therapy with no c/o pain throughout session. Session focused on ADL transfers, standing balance, and bathing at shower level. Pt completed transfer to toilet and then to shower with mod A with vc for UE placement. Pt instructed in use of LH sponge for distal LE bathing with return of demo at (S)  level. Vc for activity pacing throughout and use of rest breaks. Pt completed stand pivot to w/c from TTB with min A. Pt completed LB dressing at w/c level with A only for pulling up in standing. Pt completed UB dressing and grooming with set up. Pt returned to supine in bed with alarm set and all needs met.   Therapy Documentation Precautions:  Precautions Precautions: Fall Precaution Comments: R lateral lean Restrictions Weight Bearing Restrictions: No Other Position/Activity Restrictions: WBAT on L foot Pain: Pain Assessment Pain Scale: 0-10 Pain Score: 0-No pain ADL: ADL ADL Comments: refer to functional navigator  See Function Navigator for Current Functional Status.   Therapy/Group: Individual Therapy  Curtis Sites 10/15/2017, 10:37 AM

## 2017-10-15 NOTE — Plan of Care (Signed)
  Problem: Consults Goal: RH STROKE PATIENT EDUCATION Description See Patient Education module for education specifics  Outcome: Progressing   Problem: RH BOWEL ELIMINATION Goal: RH STG MANAGE BOWEL WITH ASSISTANCE Description STG Manage Bowel with min Assistance.  Outcome: Progressing Goal: RH STG MANAGE BOWEL W/MEDICATION W/ASSISTANCE Description STG Manage Bowel with Medication with min Assistance.  Outcome: Progressing   Problem: RH BLADDER ELIMINATION Goal: RH STG MANAGE BLADDER WITH ASSISTANCE Description STG Manage Bladder With min Assistance  Outcome: Progressing   Problem: RH SKIN INTEGRITY Goal: RH STG SKIN FREE OF INFECTION/BREAKDOWN Description min  Outcome: Progressing   Problem: RH SAFETY Goal: RH STG ADHERE TO SAFETY PRECAUTIONS W/ASSISTANCE/DEVICE Description STG Adhere to Safety Precautions With min Assistance/Device.  Outcome: Progressing   Problem: RH PAIN MANAGEMENT Goal: RH STG PAIN MANAGED AT OR BELOW PT'S PAIN GOAL Description 3 or less  Outcome: Progressing   Problem: RH KNOWLEDGE DEFICIT Goal: RH STG INCREASE KNOWLEDGE OF DIABETES Outcome: Progressing Goal: RH STG INCREASE KNOWLEDGE OF HYPERTENSION Outcome: Progressing Goal: RH STG INCREASE KNOWLEGDE OF HYPERLIPIDEMIA Outcome: Progressing Goal: RH STG INCREASE KNOWLEDGE OF STROKE PROPHYLAXIS Outcome: Progressing

## 2017-10-15 NOTE — Progress Notes (Signed)
[facility] PHYSICAL MEDICINE & REHABILITATION     PROGRESS NOTE  Subjective: Patient seen lying in bed this morning. She states she slept well overnight. She denies complaints.  ROS: Denies CP, SOB, N/V/D  Objective:  No results found. No results for input(s): WBC, HGB, HCT, PLT in the last 72 hours. No results for input(s): NA, K, CL, GLUCOSE, BUN, CREATININE, CALCIUM in the last 72 hours.  Invalid input(s): CO CBG (last 3)  Recent Labs    10/14/17 1632 10/14/17 2112 10/15/17 0416  GLUCAP 174* 211* 169*    Wt Readings from Last 3 Encounters:  10/09/17 105.2 kg  10/06/17 108.9 kg  10/06/17 109.3 kg     Intake/Output Summary (Last 24 hours) at 10/15/2017 0847 Last data filed at 10/14/2017 1700 Gross per 24 hour  Intake 480 ml  Output -  Net 480 ml    Vital Signs: Blood pressure (!) 179/73, pulse 95, temperature 98.6 F (37 C), temperature source Oral, resp. rate 18, height 5\' 6"  (1.676 m), weight 105.2 kg, SpO2 100 %. Physical Exam:  Constitutional: No distress . Vital signs reviewed. HENT: Normocephalic.  Atraumatic. Eyes: EOMI. No discharge. Cardiovascular: RRR. No JVD. Respiratory: CTA bilaterally. Normal effort. GI: BS +. Non-distended. Musc: No edema or tenderness in extremities. Musculoskeletal:TTP left 5th Metatarsal/phalanx, stable Neurological: She isalertand oriented Mild dysarthria Able to follow basic commands without difficulty.  Motor: RUE 5/5.  LUE: 4/5 prox to distal, stable.  Bilateral LE 4-/5 HF,KE and 4/5 ADF/PF, stable Skin: Skin iswarmand dry.  Psychiatric: slowed.  Assessment/Plan: 1. Functional deficits secondary to bilateral embolic cerebral infarcts which require 3+ hours per day of interdisciplinary therapy in a comprehensive inpatient rehab setting. Physiatrist is providing close team supervision and 24 hour management of active medical problems listed below. Physiatrist and rehab team continue to assess barriers to  discharge/monitor patient progress toward functional and medical goals.  Function:  Bathing Bathing position   Position: Wheelchair/chair at sink  Bathing parts Body parts bathed by patient: Right arm, Left arm, Chest, Abdomen, Front perineal area, Right upper leg, Left upper leg Body parts bathed by helper: Buttocks, Right lower leg, Left lower leg, Back  Bathing assist        Upper Body Dressing/Undressing Upper body dressing   What is the patient wearing?: Pull over shirt/dress     Pull over shirt/dress - Perfomed by patient: Thread/unthread right sleeve, Thread/unthread left sleeve, Put head through opening, Pull shirt over trunk          Upper body assist Assist Level: Supervision or verbal cues      Lower Body Dressing/Undressing Lower body dressing   What is the patient wearing?: Non-skid slipper socks           Non-skid slipper socks- Performed by helper: Don/doff right sock, Don/doff left sock                  Lower body assist        Toileting Toileting   Toileting steps completed by patient: Performs perineal hygiene Toileting steps completed by helper: Adjust clothing prior to toileting, Performs perineal hygiene, Adjust clothing after toileting Toileting Assistive Devices: Grab bar or rail  Toileting assist Assist level: Touching or steadying assistance (Pt.75%)   Transfers Chair/bed transfer   Chair/bed transfer method: Stand pivot Chair/bed transfer assist level: Maximal assist (Pt 25 - 49%/lift and lower)       Locomotion Ambulation Ambulation activity did not occur: Safety/medical concerns(dizziness upon standing)  Wheelchair   Type: Manual Max wheelchair distance: 10 Assist Level: Maximal assistance (Pt 25 - 49%)  Cognition Comprehension Comprehension assist level: Understands basic 90% of the time/cues < 10% of the time  Expression Expression assist level: Expresses basic 90% of the time/requires cueing < 10% of the  time.  Social Interaction Social Interaction assist level: Interacts appropriately 90% of the time - Needs monitoring or encouragement for participation or interaction.  Problem Solving Problem solving assist level: Solves basic 90% of the time/requires cueing < 10% of the time  Memory Memory assist level: Recognizes or recalls 90% of the time/requires cueing < 10% of the time   Medical Problem List and Plan: 1.Functional deficitssecondary tobilateral embolic stroke. Continue CIR 2. DVT Prophylaxis/Anticoagulation: Pharmaceutical:Lovenox 3. Pain Management:Voltaren gel to left foot. -left foot with medial avulsion fx prox 5th phalanx - darco boot ordered, WBAT LLE  4. Mood:LCSW to follow for evaluation and support. 5. Neuropsych: This patientiscapable of making decisions on herown behalf. 6. Skin/Wound Care:routine pressure relief measures. 7. Fluids/Electrolytes/Nutrition:Monitor I/O. -Encourage p.o. intake. 8. T2DMwith retinopathy: Hgb A1C-7.6. Lantus 10u qam on 8/31, increased to 15 on 9/3  Avoid metformin due to elevated SCr.   9. HTN: Montor BP bid  On Losartan and Verapamilto beresumed  Hydralazine 10 3 times a day started on 9/2, increased to 25 on 9/3  Orthostatic hypotension noted on 9/2  Continue to hold clonidine. 10. Dyslipidemia: Continuelipitor.  12. CKD: Baseline SCr 1.3 to 1.5 range in the past 6 months.  Creatinine 1.17 on 8/29  Labs are reviewed for tomorrow 13. Dyspnea- new onset:   -PT addressing activity tolerance 14.  Nausea: Improved with bowel movements  15. Leukocytosis  WBCs 12.4 on 8/29  Labs are reviewed for tomorrow  Afebrile  Continue to monitor  LOS (Days) 6 A FACE TO FACE EVALUATION WAS PERFORMED  Marwah Disbro Lorie Phenix, MD 10/15/2017 8:47 AM

## 2017-10-15 NOTE — Progress Notes (Signed)
Speech Language Pathology Daily Session Note  Patient Details  Name: Chelsea [name] MRN: [id number] Date of Birth: 01-Dec-1950  Today's Date: 10/15/2017 SLP Individual Time: 4431-5400 SLP Individual Time Calculation (min): 55 min  Short Term Goals: Week 1: SLP Short Term Goal 1 (Week 1): Patient will demonstrate functional problem solving for mildly complex but familiar tasks with Min A verbal cues.  SLP Short Term Goal 2 (Week 1): Patient will recall new, daily infomration with supervision verbal cues for use of compensatory strategies.  SLP Short Term Goal 3 (Week 1): Patient will self-monitor and correct errors during functional tasks with Min A verbal cues.  SLP Short Term Goal 4 (Week 1): Patient will consume current diet with minimal overt s/s of aspiration and Mod I for use of swallowing compensatory strategies.   Skilled Therapeutic Interventions: Skilled treatment session focused on cognitive goals. SLP facilitated session by providing Min-Mod A verbal cues for problem solving while completing a mildly complex medication management task while organizing a QD pill box. Patient also participated in a novel, mildly complex task with Min A verbal cues needed for problem solving throughout. Patient transferred back to bed at end of session. Patient left upright in bed with alarm on and all needs within reach. Continue with current plan of care.      Function:   Cognition Comprehension Comprehension assist level: Understands basic 90% of the time/cues < 10% of the time  Expression   Expression assist level: Expresses basic 90% of the time/requires cueing < 10% of the time.  Social Interaction Social Interaction assist level: Interacts appropriately 90% of the time - Needs monitoring or encouragement for participation or interaction.  Problem Solving Problem solving assist level: Solves basic 90% of the time/requires cueing < 10% of the time  Memory Memory assist level: Recognizes or  recalls 90% of the time/requires cueing < 10% of the time    Pain No/Denies Pain   Therapy/Group: Individual Therapy  Syon Tews 10/15/2017, 3:28 PM

## 2017-10-15 NOTE — Progress Notes (Signed)
Physical Therapy Session Note  Patient Details  Name: Chelsea [name] MRN: [id number] Date of Birth: February 01, 1951  Today's Date: 10/15/2017 PT Individual Time: 1100-1130 PT Individual Time Calculation (min): 30 min   Short Term Goals: Week 1:  PT Short Term Goal 1 (Week 1): pt will move supine> sit with supervision PT Short Term Goal 2 (Week 1): pt will sit> stand with min assist PT Short Term Goal 3 (Week 1): pt will transfer w/c>< bed PT Short Term Goal 4 (Week 1): pt will initiate gait training PT Short Term Goal 5 (Week 1): pt will initiate steps  Skilled Therapeutic Interventions/Progress Updates:    pt performs supine to sit with supervision.  Stand pivot transfers throughout session with mod A with facilitation for trunk control and balance due to LOB to Rt.  Standing balance training with reaching task focusing on wt shifts to the left with mod manual facilitation.  Pt left in bed with lunch set up and needs at hand, alarm set.  Therapy Documentation Precautions:  Precautions Precautions: Fall Precaution Comments: R lateral lean Restrictions Weight Bearing Restrictions: No Other Position/Activity Restrictions: WBAT on L foot Pain: Pain Assessment Pain Scale: 0-10 Pain Score: 0-No pain    Therapy/Group: Individual Therapy  Reece Fehnel 10/15/2017, 12:01 PM

## 2017-10-16 ENCOUNTER — Inpatient Hospital Stay (HOSPITAL_COMMUNITY): Payer: Medicare Other

## 2017-10-16 ENCOUNTER — Inpatient Hospital Stay (HOSPITAL_COMMUNITY): Payer: Medicare Other | Admitting: Speech Pathology

## 2017-10-16 DIAGNOSIS — D62 Acute posthemorrhagic anemia: Secondary | ICD-10-CM | POA: Diagnosis not present

## 2017-10-16 DIAGNOSIS — N183 Chronic kidney disease, stage 3 (moderate): Secondary | ICD-10-CM | POA: Diagnosis not present

## 2017-10-16 DIAGNOSIS — F410 Panic disorder [episodic paroxysmal anxiety] without agoraphobia: Secondary | ICD-10-CM | POA: Diagnosis not present

## 2017-10-16 DIAGNOSIS — E785 Hyperlipidemia, unspecified: Secondary | ICD-10-CM | POA: Diagnosis not present

## 2017-10-16 DIAGNOSIS — E1169 Type 2 diabetes mellitus with other specified complication: Secondary | ICD-10-CM | POA: Diagnosis not present

## 2017-10-16 DIAGNOSIS — I10 Essential (primary) hypertension: Secondary | ICD-10-CM | POA: Diagnosis not present

## 2017-10-16 DIAGNOSIS — N179 Acute kidney failure, unspecified: Secondary | ICD-10-CM | POA: Diagnosis not present

## 2017-10-16 DIAGNOSIS — I951 Orthostatic hypotension: Secondary | ICD-10-CM

## 2017-10-16 DIAGNOSIS — I69354 Hemiplegia and hemiparesis following cerebral infarction affecting left non-dominant side: Secondary | ICD-10-CM | POA: Diagnosis not present

## 2017-10-16 DIAGNOSIS — E669 Obesity, unspecified: Secondary | ICD-10-CM | POA: Diagnosis not present

## 2017-10-16 DIAGNOSIS — I6349 Cerebral infarction due to embolism of other cerebral artery: Secondary | ICD-10-CM | POA: Diagnosis not present

## 2017-10-16 DIAGNOSIS — G894 Chronic pain syndrome: Secondary | ICD-10-CM | POA: Diagnosis not present

## 2017-10-16 LAB — BASIC METABOLIC PANEL
ANION GAP: 9 (ref 5–15)
BUN: 17 mg/dL (ref 8–23)
CO2: 30 mmol/L (ref 22–32)
Calcium: 8.1 mg/dL — ABNORMAL LOW (ref 8.9–10.3)
Chloride: 103 mmol/L (ref 98–111)
Creatinine, Ser: 1.28 mg/dL — ABNORMAL HIGH (ref 0.44–1.00)
GFR calc Af Amer: 49 mL/min — ABNORMAL LOW (ref >60)
GFR, EST NON AFRICAN AMERICAN: 42 mL/min — AB (ref >60)
Glucose, Bld: 164 mg/dL — ABNORMAL HIGH (ref 70–99)
POTASSIUM: 3.5 mmol/L (ref 3.5–5.1)
Sodium: 142 mmol/L (ref 135–145)

## 2017-10-16 LAB — CBC
HEMATOCRIT: 35.2 % — AB (ref 36.0–46.0)
Hemoglobin: 11.2 g/dL — ABNORMAL LOW (ref 12.0–15.0)
MCH: 29.6 pg (ref 26.0–34.0)
MCHC: 31.8 g/dL (ref 30.0–36.0)
MCV: 93.1 fL (ref 78.0–100.0)
Platelets: 268 10*3/uL (ref 150–400)
RBC: 3.78 MIL/uL — ABNORMAL LOW (ref 3.87–5.11)
RDW: 11.9 % (ref 11.5–15.5)
WBC: 6.8 10*3/uL (ref 4.0–10.5)

## 2017-10-16 LAB — GLUCOSE, CAPILLARY
GLUCOSE-CAPILLARY: 196 mg/dL — AB (ref 70–99)
GLUCOSE-CAPILLARY: 202 mg/dL — AB (ref 70–99)
Glucose-Capillary: 145 mg/dL — ABNORMAL HIGH (ref 70–99)
Glucose-Capillary: 249 mg/dL — ABNORMAL HIGH (ref 70–99)

## 2017-10-16 NOTE — Progress Notes (Signed)
Speech Language Pathology Weekly Progress and Session Note  Patient Details  Name: Chelsea [name] MRN: [id number] Date of Birth: 22-Mar-1950  Beginning of progress report period: October 09, 2017 End of progress report period: October 16, 2017  Today's Date: 10/16/2017 SLP Individual Time: 0700-0800 SLP Individual Time Calculation (min): 60 min  Short Term Goals: Week 1: SLP Short Term Goal 1 (Week 1): Patient will demonstrate functional problem solving for mildly complex but familiar tasks with Min A verbal cues.  SLP Short Term Goal 1 - Progress (Week 1): Met SLP Short Term Goal 2 (Week 1): Patient will recall new, daily infomration with supervision verbal cues for use of compensatory strategies.  SLP Short Term Goal 2 - Progress (Week 1): Met SLP Short Term Goal 3 (Week 1): Patient will self-monitor and correct errors during functional tasks with Min A verbal cues.  SLP Short Term Goal 3 - Progress (Week 1): Met SLP Short Term Goal 4 (Week 1): Patient will consume current diet with minimal overt s/s of aspiration and Mod I for use of swallowing compensatory strategies.  SLP Short Term Goal 4 - Progress (Week 1): Met    New Short Term Goals: Week 2: SLP Short Term Goal 1 (Week 2): Patient will demonstrate functional problem solving for mildly complex but familiar tasks with Supervision verbal cues.  SLP Short Term Goal 2 (Week 2): Patient will recall new, daily infomration with Mod I verbal cues for use of compensatory strategies.  SLP Short Term Goal 3 (Week 2): Patient will self-monitor and correct errors during functional tasks with supervision verbal cues.   Weekly Progress Updates: Patient has made functional gains and has met 4 of 4 LTGs. Currently, patient requires overall supervision-Min A verbal cues for problem solving, recall, and emergent awareness with functional and familiar tasks. Patient is also consuming regular textures with thin liquids with minimal overt s/s of  aspiration and overall mod I for use of swallowing compensatory strategies. Patient and family education is ongoing. Patient would benefit from continued skilled SLP intervention to maximize her cognitive function and overall functional independence prior to discharge.      Intensity: Minumum of 1-2 x/day, 30 to 90 minutes Frequency: 3 to 5 out of 7 days Duration/Length of Stay: 10/29/17 Treatment/Interventions: Cognitive remediation/compensation;Environmental controls;Speech/Language facilitation;Therapeutic Activities;Patient/family education;Functional tasks;Cueing hierarchy;Dysphagia/aspiration precaution training   Daily Session  Skilled Therapeutic Interventions: Skilled treatment session focused on dysphagia and cognitive goals. SLP facilitated session by providing skilled observation with breakfast meal of mixed and regular consistencies.  Patient consumed meal with overt cough X 1, suspect due to firmness of solid textures (non-ripe fruit) with patient being edentulous. Patient educated on need for extra caution and possible need to avoid certain foods due to dentition. She verbalized understanding and agreement. Patient performed self-care tasks and required Min A verbal cues for problem solving and safety with tasks. Patient's family present throughout session and educated on patient's current cognitive and swallowing function and goals of skilled SLP intervention. All verbalized understanding. Patient left on commode with NT present. Continue with current plan of care.      Function:   Eating Eating   Modified Consistency Diet: No Eating Assist Level: Supervision or verbal cues           Cognition Comprehension Comprehension assist level: Understands basic 90% of the time/cues < 10% of the time  Expression   Expression assist level: Expresses basic 90% of the time/requires cueing < 10% of the time.  Social  Interaction Social Interaction assist level: Interacts appropriately  90% of the time - Needs monitoring or encouragement for participation or interaction.  Problem Solving Problem solving assist level: Solves basic 90% of the time/requires cueing < 10% of the time  Memory Memory assist level: Recognizes or recalls 90% of the time/requires cueing < 10% of the time   Pain Pain Assessment Pain Scale: Faces Faces Pain Scale: Hurts a little bit Pain Type: Acute pain Pain Location: Abdomen Pain Orientation: Mid Pain Descriptors / Indicators: Aching Pain Onset: On-going Pain Intervention(s): Other (Comment)(pt requested prune juice )  Therapy/Group: Individual Therapy  Robben Jagiello 10/16/2017, 12:30 PM

## 2017-10-16 NOTE — Plan of Care (Signed)
  Problem: Consults Goal: RH STROKE PATIENT EDUCATION Description See Patient Education module for education specifics  Outcome: Progressing   Problem: RH BOWEL ELIMINATION Goal: RH STG MANAGE BOWEL WITH ASSISTANCE Description STG Manage Bowel with min Assistance.  Outcome: Progressing Goal: RH STG MANAGE BOWEL W/MEDICATION W/ASSISTANCE Description STG Manage Bowel with Medication with min Assistance.  Outcome: Progressing   Problem: RH BLADDER ELIMINATION Goal: RH STG MANAGE BLADDER WITH ASSISTANCE Description STG Manage Bladder With min Assistance  Outcome: Progressing   Problem: RH SKIN INTEGRITY Goal: RH STG SKIN FREE OF INFECTION/BREAKDOWN Description min  Outcome: Progressing   Problem: RH SAFETY Goal: RH STG ADHERE TO SAFETY PRECAUTIONS W/ASSISTANCE/DEVICE Description STG Adhere to Safety Precautions With min Assistance/Device.  Outcome: Progressing   Problem: RH PAIN MANAGEMENT Goal: RH STG PAIN MANAGED AT OR BELOW PT'S PAIN GOAL Description 3 or less  Outcome: Progressing   Problem: RH KNOWLEDGE DEFICIT Goal: RH STG INCREASE KNOWLEDGE OF DIABETES Outcome: Progressing Goal: RH STG INCREASE KNOWLEDGE OF HYPERTENSION Outcome: Progressing Goal: RH STG INCREASE KNOWLEGDE OF HYPERLIPIDEMIA Outcome: Progressing Goal: RH STG INCREASE KNOWLEDGE OF STROKE PROPHYLAXIS Outcome: Progressing

## 2017-10-16 NOTE — Progress Notes (Signed)
[facility] PHYSICAL MEDICINE & REHABILITATION     PROGRESS NOTE  Subjective: Pt seen sitting up at the edge of the bed this AM, working with SLP.  She slept well overnight.  With the help of SLP, she indicates constipation.  Family with questions regarding cutting patient's toe nails.   ROS: Denies CP, SOB, N/V/D  Objective:  No results found. Recent Labs    10/16/17 0458  WBC 6.8  HGB 11.2*  HCT 35.2*  PLT 268   Recent Labs    10/16/17 0458  NA 142  K 3.5  CL 103  GLUCOSE 164*  BUN 17  CREATININE 1.28*  CALCIUM 8.1*   CBG (last 3)  Recent Labs    10/15/17 2132 10/16/17 0407 10/16/17 1120  GLUCAP 146* 145* 249*    Wt Readings from Last 3 Encounters:  10/09/17 105.2 kg  10/06/17 108.9 kg  10/06/17 109.3 kg     Intake/Output Summary (Last 24 hours) at 10/16/2017 1305 Last data filed at 10/16/2017 0900 Gross per 24 hour  Intake 240 ml  Output -  Net 240 ml    Vital Signs: Blood pressure (!) 164/64, pulse 84, temperature 98.4 F (36.9 C), temperature source Oral, resp. rate 18, height 5\' 6"  (1.676 m), weight 105.2 kg, SpO2 100 %. Physical Exam:  Constitutional: No distress . Vital signs reviewed. HENT: Normocephalic.  Atraumatic. Eyes: EOMI. No discharge. Cardiovascular: RRR. No JVD. Respiratory: CTA bilaterally. Normal effort. GI: BS +. Non-distended. Musc: No edema or tenderness in extremities. Musculoskeletal:TTP left 5th Metatarsal/phalanx, improving Neurological: She isalertand oriented Mild dysarthria Able to follow basic commands without difficulty.  Motor: RUE 5/5.  LUE: 4-4+/5 prox to distal, stable.  Bilateral LE: 4-/5 HF,KE and 4/5 ADF/PF, stable Skin: Skin iswarmand dry.  Psychiatric: slowed.  Assessment/Plan: 1. Functional deficits secondary to bilateral embolic cerebral infarcts which require 3+ hours per day of interdisciplinary therapy in a comprehensive inpatient rehab setting. Physiatrist is providing close team  supervision and 24 hour management of active medical problems listed below. Physiatrist and rehab team continue to assess barriers to discharge/monitor patient progress toward functional and medical goals.  Function:  Bathing Bathing position   Position: Shower  Bathing parts Body parts bathed by patient: Right arm, Left arm, Chest, Abdomen, Front perineal area, Right upper leg, Left upper leg, Right lower leg, Left lower leg, Buttocks Body parts bathed by helper: Back  Bathing assist Assist Level: Touching or steadying assistance(Pt > 75%), Assistive device Assistive Device Comment: LH sponge    Upper Body Dressing/Undressing Upper body dressing   What is the patient wearing?: Pull over shirt/dress     Pull over shirt/dress - Perfomed by patient: Thread/unthread right sleeve, Thread/unthread left sleeve, Put head through opening, Pull shirt over trunk          Upper body assist Assist Level: Supervision or verbal cues      Lower Body Dressing/Undressing Lower body dressing   What is the patient wearing?: Underwear Underwear - Performed by patient: Thread/unthread right underwear leg, Thread/unthread left underwear leg Underwear - Performed by helper: Pull underwear up/down       Non-skid slipper socks- Performed by helper: Don/doff right sock, Don/doff left sock                  Lower body assist Assist for lower body dressing: Touching or steadying assistance (Pt > 75%)      Toileting Toileting   Toileting steps completed by patient: Performs perineal hygiene Toileting  steps completed by helper: Adjust clothing prior to toileting, Adjust clothing after toileting Toileting Assistive Devices: Grab bar or rail  Toileting assist Assist level: Touching or steadying assistance (Pt.75%)   Transfers Chair/bed transfer   Chair/bed transfer method: Stand pivot Chair/bed transfer assist level: Moderate assist (Pt 50 - 74%/lift or lower)       Locomotion Ambulation  Ambulation activity did not occur: Safety/medical concerns(dizziness upon standing)         Wheelchair   Type: Manual Max wheelchair distance: 10 Assist Level: Maximal assistance (Pt 25 - 49%)  Cognition Comprehension Comprehension assist level: Understands basic 90% of the time/cues < 10% of the time  Expression Expression assist level: Expresses basic 90% of the time/requires cueing < 10% of the time.  Social Interaction Social Interaction assist level: Interacts appropriately 90% of the time - Needs monitoring or encouragement for participation or interaction.  Problem Solving Problem solving assist level: Solves basic 90% of the time/requires cueing < 10% of the time  Memory Memory assist level: Recognizes or recalls 90% of the time/requires cueing < 10% of the time   Medical Problem List and Plan: 1.Functional deficitssecondary tobilateral embolic stroke. Continue CIR 2. DVT Prophylaxis/Anticoagulation: Pharmaceutical:Lovenox 3. Pain Management:Voltaren gel to left foot. -left foot with medial avulsion fx prox 5th phalanx - darco boot ordered, WBAT LLE  4. Mood:LCSW to follow for evaluation and support. 5. Neuropsych: This patientiscapable of making decisions on herown behalf. 6. Skin/Wound Care:routine pressure relief measures. 7. Fluids/Electrolytes/Nutrition:Monitor I/O. -Encourage p.o. intake. 8. T2DMwith retinopathy: Hgb A1C-7.6. Lantus 10u qam on 8/31, increased to 15 on 9/3  Avoid metformin due to elevated SCr.    Elevated this AM, otherwise normalizing 9. HTN: Montor BP bid  On Losartan and Verapamilto beresumed  Hydralazine 10 3 times a day started on 9/2, increased to 25 on 9/3,  Symptomatic Orthostatic hypotension noted on 9/4.  TEDS/Abdominal binder ordered  Continue to hold clonidine. 10. Dyslipidemia: Continuelipitor.  12. CKD: Baseline SCr 1.3 to 1.5 range in the past 6  months.  Creatinine 1.28 on 9/4 13. Dyspnea- new onset:   -PT addressing activity tolerance 14.  Nausea: Improved with bowel movements  15. Leukocytosis: Resolved  WBCs 6.8 on 9/4  Afebrile  Continue to monitor 16. ABLA  Hb 11.2 on 9/4  Cont to monitor  LOS (Days) 7 A FACE TO FACE EVALUATION WAS PERFORMED  Adnan Vanvoorhis Lorie Phenix, MD 10/16/2017 1:05 PM

## 2017-10-16 NOTE — Progress Notes (Signed)
Physical Therapy Session Note  Patient Details  Name: Chelsea [name] MRN: [id number] Date of Birth: March 14, 1950  Today's Date: 10/16/2017 PT Individual Time: 9169-4503 PT Individual Time Calculation (min): 50 min   Short Term Goals: Week 1:  PT Short Term Goal 1 (Week 1): pt will move supine> sit with supervision PT Short Term Goal 2 (Week 1): pt will sit> stand with min assist PT Short Term Goal 3 (Week 1): pt will transfer w/c>< bed with mod assist PT Short Term Goal 4 (Week 1): pt will initiate gait training PT Short Term Goal 5 (Week 1): pt will initiate steps  Skilled Therapeutic Interventions/Progress Updates:   Pt stated that she had 2 very bad dizzy spells after BM x 2 this AM, and is afraid to get OOB.  Pt reported that she felt she was "turning upside down" she was so dizzy.  Supine: 10 x 1 each for motor control : bil hip adduction against resistance, bil bridging, bil lower trunk rotation, L short arc quad knee extension, L/R straight leg raises; 2 x 10 each alternating reciprocal R/L ankle pumps, bil shoulder abduction; 1 x 15 R/L hip abduction via clam shells.   sustained stretch R/L obliques x 1 minute each.  Pt reported that her BP meds had been adjusted PTA, and she was having dizzy spells at home,  and spending more time than usual in bed.  BP in supine 164/64, HR 84.  BS per [place] Nt during session = 248.    Pt rolled L><R with supervision, using rails, and scooted to [facility] with min assist for LLE and use of rails, in flat bed.  Pt left resting in supine with needs at hand and alarm set.     Therapy Documentation Precautions:  Precautions Precautions: Fall Precaution Comments: R lateral lean Restrictions Weight Bearing Restrictions: No Other Position/Activity Restrictions: WBAT on L foot General: PT Amount of Missed Time (min): 25 Minutes PT Missed Treatment Reason: Patient fatigue(dizzy spells this AM, with BMs)  Pain: Pain Assessment Pt denies    See  Function Navigator for Current Functional Status.   Therapy/Group: Individual Therapy  Ming Kunka 10/16/2017, 11:49 AM

## 2017-10-16 NOTE — Progress Notes (Signed)
Occupational Therapy Session Note  Patient Details  Name: Chelsea [name] MRN: [id number] Date of Birth: April 14, 1950  Today's Date: 10/16/2017 OT Individual Time: 0601-5615 Session 2: 3794-3276 OT Individual Time Calculation (min): 60 min Session 2: 20 min OT Missed time: 10 minutes d/t pt fatigue and feeling sick   Short Term Goals: Week 1:  OT Short Term Goal 1 (Week 1): Pt will be able to sit to stand with S and maintain standing balance with min A during toileting tasks. OT Short Term Goal 2 (Week 1): Pt will toilet with min A. OT Short Term Goal 3 (Week 1): Pt will don underwear with min A. OT Short Term Goal 4 (Week 1): Pt will be able to use RUE in bimanual task of folding towels with min A demonstrating improved coordination.  Skilled Therapeutic Interventions/Progress Updates:    Pt supine in bed with granddaughter present with c/o abdomin pain d/t constipation. Pt requested a prune juice and drank it EOB. BP was obtained supine and sitting- both times it was 170/70. Pt transferred to bed <> w/c with mod A. Pt experienced significant LOB transferring to toilet d/t dizziness, with max A recovery provided by this OT. Pt completed toileting and was able to stand with use of B grab bars. Min A required to complete posterior peri hygiene thoroughly. Pt returned to supine in bed d/t fatigue. Edu provided to pt and granddaughter re symptomology and fall risk. Pt left supine in bed with all needs met .   Session 2: Pt supine in bed c/o fatigue and feeling sick but no c/o pain. Pt agreeable to bed level treatment. Pt completed plumbers puzzle at bed level, with vc for visual attention to R UE for increased proprioception/control. Pt still overshooting with reaching, however with increased accuracy compared to last session. Pt edu re use of abdominal binder brought in by ortho during session. Pt left supine with all needs met.   Therapy Documentation Precautions:  Precautions Precautions:  Fall Precaution Comments: R lateral lean Restrictions Weight Bearing Restrictions: No Other Position/Activity Restrictions: WBAT on L foot Therapy Vitals Pulse Rate: 84 BP: (!) 164/64 Patient Position (if appropriate): Lying Pain: Pain Assessment Pain Scale: Faces Faces Pain Scale: Hurts a little bit Pain Type: Acute pain Pain Location: Abdomen Pain Orientation: Mid Pain Descriptors / Indicators: Aching Pain Onset: On-going Pain Intervention(s): Other (Comment)(pt requested prune juice ) ADL: ADL ADL Comments: refer to functional navigator  See Function Navigator for Current Functional Status.   Therapy/Group: Individual Therapy  Curtis Sites 10/16/2017, 12:35 PM

## 2017-10-16 NOTE — Progress Notes (Signed)
Orthopedic Tech Progress Note Patient Details:  Chelsea [name] 1950/10/18 [id number]  Ortho Devices Type of Ortho Device: Abdominal binder Ortho Device/Splint Location: abdomen Ortho Device/Splint Interventions: Loanne Drilling, Missie Gehrig 10/16/2017, 1:24 PM

## 2017-10-17 ENCOUNTER — Inpatient Hospital Stay (HOSPITAL_COMMUNITY): Payer: Medicare Other | Admitting: Physical Therapy

## 2017-10-17 ENCOUNTER — Inpatient Hospital Stay (HOSPITAL_COMMUNITY): Payer: Medicare Other | Admitting: Speech Pathology

## 2017-10-17 ENCOUNTER — Inpatient Hospital Stay (HOSPITAL_COMMUNITY): Payer: Medicare Other | Admitting: Occupational Therapy

## 2017-10-17 ENCOUNTER — Inpatient Hospital Stay (HOSPITAL_COMMUNITY): Payer: Medicare Other

## 2017-10-17 DIAGNOSIS — G894 Chronic pain syndrome: Secondary | ICD-10-CM | POA: Diagnosis not present

## 2017-10-17 DIAGNOSIS — D62 Acute posthemorrhagic anemia: Secondary | ICD-10-CM | POA: Diagnosis not present

## 2017-10-17 DIAGNOSIS — F410 Panic disorder [episodic paroxysmal anxiety] without agoraphobia: Secondary | ICD-10-CM | POA: Diagnosis not present

## 2017-10-17 DIAGNOSIS — S92502S Displaced unspecified fracture of left lesser toe(s), sequela: Secondary | ICD-10-CM | POA: Diagnosis not present

## 2017-10-17 DIAGNOSIS — N179 Acute kidney failure, unspecified: Secondary | ICD-10-CM | POA: Diagnosis not present

## 2017-10-17 DIAGNOSIS — E1169 Type 2 diabetes mellitus with other specified complication: Secondary | ICD-10-CM | POA: Diagnosis not present

## 2017-10-17 DIAGNOSIS — K5901 Slow transit constipation: Secondary | ICD-10-CM | POA: Diagnosis not present

## 2017-10-17 DIAGNOSIS — I69354 Hemiplegia and hemiparesis following cerebral infarction affecting left non-dominant side: Secondary | ICD-10-CM | POA: Diagnosis not present

## 2017-10-17 DIAGNOSIS — E785 Hyperlipidemia, unspecified: Secondary | ICD-10-CM | POA: Diagnosis not present

## 2017-10-17 DIAGNOSIS — I6349 Cerebral infarction due to embolism of other cerebral artery: Secondary | ICD-10-CM | POA: Diagnosis not present

## 2017-10-17 LAB — GLUCOSE, CAPILLARY
GLUCOSE-CAPILLARY: 186 mg/dL — AB (ref 70–99)
GLUCOSE-CAPILLARY: 237 mg/dL — AB (ref 70–99)
GLUCOSE-CAPILLARY: 228 mg/dL — AB (ref 70–99)
Glucose-Capillary: 243 mg/dL — ABNORMAL HIGH (ref 70–99)
Glucose-Capillary: 150 mg/dL — ABNORMAL HIGH (ref 70–99)

## 2017-10-17 MED ORDER — INSULIN GLARGINE 100 UNIT/ML ~~LOC~~ SOLN
18.0000 [IU] | Freq: Every day | SUBCUTANEOUS | Status: DC
  Administered 2017-10-18 – 2017-10-25 (×8): 18 [IU] via SUBCUTANEOUS
  Filled 2017-10-17 (×9): qty 0.18

## 2017-10-17 NOTE — Progress Notes (Signed)
Occupational Therapy Note  Patient Details  Name: Chelsea [name] MRN: [id number] Date of Birth: 08-14-1950  Pt refused 30 mins of OT make-up session secondary to nausea and not feeling well.  Nursing made aware.    Tyus Kallam OTR/L 10/17/2017, 3:13 PM

## 2017-10-17 NOTE — Progress Notes (Signed)
Speech Language Pathology Daily Session Note  Patient Details  Name: Chelsea [name] MRN: [id number] Date of Birth: [birth year]  Today's Date: 10/17/2017 SLP Individual Time: 0830-0925 SLP Individual Time Calculation (min): 55 min  Short Term Goals: Week 2: SLP Short Term Goal 1 (Week 2): Patient will demonstrate functional problem solving for mildly complex but familiar tasks with Supervision verbal cues.  SLP Short Term Goal 2 (Week 2): Patient will recall new, daily infomration with Mod I verbal cues for use of compensatory strategies.  SLP Short Term Goal 3 (Week 2): Patient will self-monitor and correct errors during functional tasks with supervision verbal cues.   Skilled Therapeutic Interventions: Skilled treatment session focused on cognitive goals. SLP facilitated session with novel card task targeting use of memory compensatory strategies, specifically association. Patient required Max A verbal cues to generate associations and name items within the categories and recalled items at end of task with Mod A multimodal cues. Patient left upright in bed with all needs within reach. Continue with current plan of care.      Function:   Cognition Comprehension Comprehension assist level: Understands basic 90% of the time/cues < 10% of the time  Expression   Expression assist level: Expresses basic 90% of the time/requires cueing < 10% of the time.  Social Interaction Social Interaction assist level: Interacts appropriately 90% of the time - Needs monitoring or encouragement for participation or interaction.  Problem Solving Problem solving assist level: Solves basic 90% of the time/requires cueing < 10% of the time  Memory Memory assist level: Recognizes or recalls 25 - 49% of the time/requires cueing 50 - 75% of the time    Pain Pain Assessment Pain Scale: 0-10 Pain Score: 0-No pain  Therapy/Group: Individual Therapy  Chelsea [name] 10/17/2017, 3:54 PM

## 2017-10-17 NOTE — Progress Notes (Signed)
Recreational Therapy Session Note  Patient Details  Name: Chelsea [name] MRN: [id number] Date of Birth: 07-05-1950 Today's Date: 10/17/2017  Pt scheduled for TR eval during co-treat with PT today.  Pt c/o of not feeling well and session ended prior to LRT arrival.  Eval defferred until next week. [place] 10/17/2017, 12:13 PM

## 2017-10-17 NOTE — Progress Notes (Signed)
Social Work [name], female   DOB: 1950/08/03, 67 y.o.   MRN: [id number]   Have reviewed team conference with pt and daughter, Chelsea [name].  Both aware and agreeable with targeted d/c date 9/17 and supervision goals overall.  Daughter reports she has coordinated 24/7 support for her mother shared by family and a private duty caregiver.  Discussed need for family ed and have planned for daughter and caregiver to be here on Mon 9/16 9-12 for this.  Have alerted therapies.  Continue to follow.  Bronc Brosseau, LCSW

## 2017-10-17 NOTE — Progress Notes (Addendum)
[facility]IC[facility] & RE[facility]ABILIT[facility]     PROGRESS NOTE  Subjective: Slept well. Had some dizziness yesterday  ROS: Patient denies fever, rash, sore throat, blurred vision, nausea, vomiting, diarrhea, cough, shortness of breath or chest pain, joint or back pain, headache, or mood change.    Objective:  No results found. Recent Labs    10/16/17 0458  WBC 6.8  HGB 11.2*  HCT 35.2*  PLT 268   Recent Labs    10/16/17 0458  NA 142  K 3.5  CL 103  GLUCOSE 164*  BUN 17  CREATININE 1.28*  CALCIUM 8.1*   CBG (last 3)  Recent Labs    10/16/17 1633 10/16/17 2134 10/17/17 0659  GLUCAP 196* 202* 186*    Wt Readings from Last 3 Encounters:  10/09/17 105.2 kg  10/06/17 108.9 kg  10/06/17 109.3 kg     Intake/Output Summary (Last 24 hours) at 10/17/2017 1004 Last data filed at 10/16/2017 1900 Gross per 24 hour  Intake 480 ml  Output -  Net 480 ml    Vital Signs: Blood pressure (!) 189/79, pulse 91, temperature 98 F (36.7 C), temperature source Oral, resp. rate 17, height 5\' 6"  (1.676 m), weight 105.2 kg, SpO2 100 %. Physical Exam:  Constitutional: No distress . Vital signs reviewed. HEENT: EOMI, oral membranes moist Neck: supple Cardiovascular: RRR without murmur. No JVD    Respiratory: CTA Bilaterally without wheezes or rales. Normal effort    GI: BS +, non-tender, non-distended  Musc: No edema or tenderness in extremities. Musculoskeletal:TTP left 5th Metatarsal/phalanx unchanged Neurological: She isalertand oriented Mild dysarthria Able to follow basic commands without difficulty.  Motor: RUE 5/5.  LUE: 4-4+/5 prox to distal, stable.  Bilateral LE: 4-/5 HF,KE and 4/5 ADF/PF, stable to improved Skin: Skin iswarmand dry.  Psychiatric: pleasant  Assessment/Plan: 1. Functional deficits secondary to bilateral embolic cerebral infarcts which require 3+ hours per day of interdisciplinary therapy in a comprehensive inpatient rehab  setting. Physiatrist is providing close team supervision and 24 hour management of active medical problems listed below. Physiatrist and rehab team continue to assess barriers to discharge/monitor patient progress toward functional and medical goals.  Function:  Bathing Bathing position   Position: Shower  Bathing parts Body parts bathed by patient: Right arm, Left arm, Chest, Abdomen, Front perineal area, Right upper leg, Left upper leg, Right lower leg, Left lower leg, Buttocks Body parts bathed by helper: Back  Bathing assist Assist Level: Touching or steadying assistance(Pt > 75%), Assistive device Assistive Device Comment: LH sponge    Upper Body Dressing/Undressing Upper body dressing   What is the patient wearing?: Pull over shirt/dress     Pull over shirt/dress - Perfomed by patient: Thread/unthread right sleeve, Thread/unthread left sleeve, Put head through opening, Pull shirt over trunk          Upper body assist Assist Level: Supervision or verbal cues      Lower Body Dressing/Undressing Lower body dressing   What is the patient wearing?: Underwear Underwear - Performed by patient: Thread/unthread right underwear leg, Thread/unthread left underwear leg Underwear - Performed by helper: Pull underwear up/down       Non-skid slipper socks- Performed by helper: Don/doff right sock, Don/doff left sock                  Lower body assist Assist for lower body dressing: Touching or steadying assistance (Pt > 75%)      Toileting Toileting   Toileting steps completed  by patient: Performs perineal hygiene Toileting steps completed by helper: Adjust clothing prior to toileting, Adjust clothing after toileting Toileting Assistive Devices: Grab bar or rail  Toileting assist Assist level: Touching or steadying assistance (Pt.75%)   Transfers Chair/bed transfer   Chair/bed transfer method: Stand pivot Chair/bed transfer assist level: Moderate assist (Pt 50 -  74%/lift or lower)       Locomotion Ambulation Ambulation activity did not occur: Safety/medical concerns(dizziness upon standing)         Wheelchair   Type: Manual Max wheelchair distance: 10 Assist Level: Maximal assistance (Pt 25 - 49%)  Cognition Comprehension Comprehension assist level: Understands basic 90% of the time/cues < 10% of the time  Expression Expression assist level: Expresses basic 90% of the time/requires cueing < 10% of the time.  Social Interaction Social Interaction assist level: Interacts appropriately 90% of the time - Needs monitoring or encouragement for participation or interaction.  Problem Solving Problem solving assist level: Solves basic 90% of the time/requires cueing < 10% of the time  Memory Memory assist level: Recognizes or recalls 90% of the time/requires cueing < 10% of the time   Medical Problem List and Plan: 1.Functional deficitssecondary tobilateral embolic stroke. Continue CIR 2. DVT Prophylaxis/Anticoagulation: Pharmaceutical:Lovenox 3. Pain Management:Voltaren gel to left foot. -left foot with medial avulsion fx prox 5th phalanx - darco delivered, WBAT LLE  4. Mood:LCSW to follow for evaluation and support. 5. Neuropsych: This patientiscapable of making decisions on herown behalf. 6. Skin/Wound Care:routine pressure relief measures. 7. Fluids/Electrolytes/Nutrition:Monitor I/O. -Encourage p.o. intake. 8. T2DMwith retinopathy: Hgb A1C-7.6.    Avoid metformin due to elevated SCr.    Continued elevation---increase to 18u lantus qam 9. HTN: Montor BP bid  On Losartan and Verapamilto beresumed  Hydralazine 10 3 times a day started on 9/2, increased to 25 on 9/3,  Symptomatic Orthostatic hypotension noted on 9/4. Better on today's recordings   -continueTEDS/Abdominal binder ordered, encouraged fluid intake also  Continue to hold clonidine. 10. Dyslipidemia:  Continuelipitor.  12. CKD: Baseline SCr 1.3 to 1.5 range in the past 6 months.  Creatinine 1.28 on 9/4 13. Dyspnea- new onset:   -PT addressing activity tolerance 14.  Nausea: Improved with bowel movements  15. Leukocytosis: Resolved  WBCs 6.8 on 9/4  Afebrile  Continue to monitor 16. ABLA  Hb 11.2 on 9/4  Cont to monitor  LOS (Days) 8 A [place] EVALUATION WAS PERFORMED  Meredith Staggers, MD 10/17/2017 10:04 AM

## 2017-10-17 NOTE — Progress Notes (Signed)
Physical Therapy Weekly Progress Note  Patient Details  Name: Chelsea [name] MRN: [id number] Date of Birth: 03/27/1950  Beginning of progress report period: October 10, 2017 End of progress report period: October 17, 2017  Today's Date: 10/17/2017 PT Individual Time: 0945-1040 PT Individual Time Calculation (min): 55 min   Pt up in chair, reports decreased dizziness vs yesterday.  Abdominal binder donned in standing. Pt performs sit <> stand throughout session with mod A, increased Rt lean due to Lt darco shoe.  PT raised pt's Rt shoe to even leg length. Pt performs gait 10' x 3 during session with trunkal ataxia, mod A due to Rt lean, assist to progress RW due to Rt UE weakness and ataxia.  Pt performs standing tap ups with Lt LE to promote Rt LE strength with mod A, cues for posture and stability.  Sitting posture with Lt reaching for trunk control with improved posture after repetition.  Pt with increased confusion this session, decreased memory throughout session.  Patient has met 3 of 4 short term goals.  Pt improving bed mobility and transfers. Progress limited by orthostatic dizziness limiting participation. Also pt rec'd Darco shoe after being here a week and wt bearing has been limited.   Patient continues to demonstrate the following deficits muscle weakness, impaired timing and sequencing, abnormal tone, unbalanced muscle activation, ataxia, decreased coordination and decreased motor planning, decreased awareness, decreased problem solving, decreased memory and delayed processing and decreased sitting balance, decreased standing balance, decreased postural control, hemiplegia and decreased balance strategies and therefore will continue to benefit from skilled PT intervention to increase functional independence with mobility.  Patient progressing toward long term goals..  Continue plan of care.  PT Short Term Goals Week 1:  PT Short Term Goal 1 (Week 1): pt will move supine> sit with  supervision PT Short Term Goal 1 - Progress (Week 1): Met PT Short Term Goal 2 (Week 1): pt will sit> stand with min assist PT Short Term Goal 2 - Progress (Week 1): Progressing toward goal PT Short Term Goal 3 (Week 1): pt will transfer w/c>< bed with mod assist PT Short Term Goal 3 - Progress (Week 1): Met PT Short Term Goal 4 (Week 1): pt will initiate gait training PT Short Term Goal 4 - Progress (Week 1): Met PT Short Term Goal 5 (Week 1): pt will initiate steps Week 2:  PT Short Term Goal 1 (Week 2): pt will perform functional transfers with min A PT Short Term Goal 2 (Week 2): pt will perform gait x 10' in controlled environment with min A  Skilled Therapeutic Interventions/Progress Updates:  Ambulation/gait training;Community reintegration;DME/adaptive equipment instruction;Neuromuscular re-education;Psychosocial support;Stair training;UE/LE Strength taining/ROM;Wheelchair propulsion/positioning;Balance/vestibular training;Discharge planning;Functional electrical stimulation;Pain management;Therapeutic Activities;UE/LE Coordination activities;Cognitive remediation/compensation;Functional mobility training;Patient/family education;Splinting/orthotics;Therapeutic Exercise;Visual/perceptual remediation/compensation   Therapy Documentation Precautions:  Precautions Precautions: Fall Precaution Comments: R lateral lean Restrictions Weight Bearing Restrictions: No Other Position/Activity Restrictions: WBAT on L foot Pain: Pain Assessment Pain Scale: 0-10 Pain Score: 0-No pain   Therapy/Group: Individual Therapy  DONAWERTH,KAREN 10/17/2017, 10:46 AM  

## 2017-10-17 NOTE — Progress Notes (Signed)
Occupational Therapy Weekly Progress Note  Patient Details  Name: Chelsea [name] MRN: [id number] Date of Birth: July 14, 1950  Beginning of progress report period: October 10, 2017 End of progress report period: October 17, 2017  Today's Date: 10/17/2017 OT Individual Time: 1300-1400 OT Individual Time Calculation (min): 60 min    Patient has met 4 of 4 short term goals.  Pt has made steady, yet inconsistent progress this progress report period. Pt is still struggling with inconsistent dizziness during position changes and ADL transfers that impacts her safety during functional mobility and ADLs in general.  Patient continues to demonstrate the following deficits: muscle weakness, decreased cardiorespiratoy endurance, decreased awareness, decreased problem solving and decreased safety awareness and decreased standing balance, decreased postural control and decreased balance strategies and therefore will continue to benefit from skilled OT intervention to enhance overall performance with BADL.  Patient progressing toward long term goals..  Continue plan of care.  OT Short Term Goals Week 1:  OT Short Term Goal 1 (Week 1): Pt will be able to sit to stand with S and maintain standing balance with min A during toileting tasks. OT Short Term Goal 1 - Progress (Week 1): Met OT Short Term Goal 2 (Week 1): Pt will toilet with min A. OT Short Term Goal 2 - Progress (Week 1): Met OT Short Term Goal 3 (Week 1): Pt will don underwear with min A. OT Short Term Goal 3 - Progress (Week 1): Met OT Short Term Goal 4 (Week 1): Pt will be able to use RUE in bimanual task of folding towels with min A demonstrating improved coordination. OT Short Term Goal 4 - Progress (Week 1): Met Week 2:  OT Short Term Goal 1 (Week 2): Pt will don underwear with (S) OT Short Term Goal 2 (Week 2): Pt will require no more than 1 vc for safety awareness during standing level clothing management OT Short Term Goal 3 (Week 2): Pt  will complete toilet transfer with (S)  Skilled Therapeutic Interventions/Progress Updates:    Pt supine in bed agreeable to therapy, requesting to wash up at sink. No c/o pain throughout session. Pt transferred bed > w/c with CGA for safety. With set up, pt completed UB bathing and dressing. Heavy vc required for safety to not reach distally in standing. Pt able to thread B LE through underwear and required min A to pull up in standing d/t body habitus. Pt required vc to sit down when beginning to lose balance in standing. Following several sit to stands pt reporting feeling dizzy and nauseous, attempting to vomit several times with no void. BP obtained in sitting, 151/63. HR 84 bpm and SpO2 98%. BP then obtained in standing with 125/83, with pt standing for 90 seconds, with heavy vc required for maintenance of posture. B teds were donned with total A. Pt returned to supine in bed with all needs met and bed alarm set.   Therapy Documentation Precautions:  Precautions Precautions: Fall Precaution Comments: R lateral lean Restrictions Weight Bearing Restrictions: No Other Position/Activity Restrictions: WBAT on L foot Pain: Pain Assessment Pain Scale: 0-10 Pain Score: 0-No pain ADL: ADL ADL Comments: refer to functional navigator  See Function Navigator for Current Functional Status.   Therapy/Group: Individual Therapy  Curtis Sites 10/17/2017, 2:26 PM

## 2017-10-18 ENCOUNTER — Inpatient Hospital Stay (HOSPITAL_COMMUNITY): Payer: Medicare Other

## 2017-10-18 ENCOUNTER — Inpatient Hospital Stay (HOSPITAL_COMMUNITY): Payer: Medicare Other | Admitting: Speech Pathology

## 2017-10-18 ENCOUNTER — Inpatient Hospital Stay (HOSPITAL_COMMUNITY): Payer: Medicare Other | Admitting: Physical Therapy

## 2017-10-18 DIAGNOSIS — E785 Hyperlipidemia, unspecified: Secondary | ICD-10-CM | POA: Diagnosis not present

## 2017-10-18 DIAGNOSIS — G894 Chronic pain syndrome: Secondary | ICD-10-CM | POA: Diagnosis not present

## 2017-10-18 DIAGNOSIS — D62 Acute posthemorrhagic anemia: Secondary | ICD-10-CM | POA: Diagnosis not present

## 2017-10-18 DIAGNOSIS — F410 Panic disorder [episodic paroxysmal anxiety] without agoraphobia: Secondary | ICD-10-CM | POA: Diagnosis not present

## 2017-10-18 DIAGNOSIS — K5901 Slow transit constipation: Secondary | ICD-10-CM | POA: Diagnosis not present

## 2017-10-18 DIAGNOSIS — I6349 Cerebral infarction due to embolism of other cerebral artery: Secondary | ICD-10-CM | POA: Diagnosis not present

## 2017-10-18 DIAGNOSIS — I69354 Hemiplegia and hemiparesis following cerebral infarction affecting left non-dominant side: Secondary | ICD-10-CM | POA: Diagnosis not present

## 2017-10-18 DIAGNOSIS — N179 Acute kidney failure, unspecified: Secondary | ICD-10-CM | POA: Diagnosis not present

## 2017-10-18 DIAGNOSIS — E1169 Type 2 diabetes mellitus with other specified complication: Secondary | ICD-10-CM | POA: Diagnosis not present

## 2017-10-18 DIAGNOSIS — S92502S Displaced unspecified fracture of left lesser toe(s), sequela: Secondary | ICD-10-CM | POA: Diagnosis not present

## 2017-10-18 LAB — GLUCOSE, CAPILLARY
GLUCOSE-CAPILLARY: 192 mg/dL — AB (ref 70–99)
GLUCOSE-CAPILLARY: 168 mg/dL — AB (ref 70–99)
Glucose-Capillary: 140 mg/dL — ABNORMAL HIGH (ref 70–99)
Glucose-Capillary: 211 mg/dL — ABNORMAL HIGH (ref 70–99)

## 2017-10-18 NOTE — Progress Notes (Signed)
[place] PHYSICAL MEDICINE & REHABILITATION     PROGRESS NOTE  Subjective: Feeling better, dizziness less.   ROS: Patient denies fever, rash, sore throat, blurred vision, nausea, vomiting, diarrhea, cough, shortness of breath or chest pain, joint or back pain, headache, or mood change.   Objective:  No results found. Recent Labs    10/16/17 0458  WBC 6.8  HGB 11.2*  HCT 35.2*  PLT 268   Recent Labs    10/16/17 0458  NA 142  K 3.5  CL 103  GLUCOSE 164*  BUN 17  CREATININE 1.28*  CALCIUM 8.1*   CBG (last 3)  Recent Labs    10/17/17 1658 10/17/17 2134 10/18/17 0702  GLUCAP 228* 150* 140*    Wt Readings from Last 3 Encounters:  10/09/17 105.2 kg  10/06/17 108.9 kg  10/06/17 109.3 kg     Intake/Output Summary (Last 24 hours) at 10/18/2017 1029 Last data filed at 10/17/2017 1300 Gross per 24 hour  Intake 240 ml  Output -  Net 240 ml    Vital Signs: Blood pressure (!) 150/69, pulse 97, temperature 98.4 F (36.9 C), temperature source Oral, resp. rate 20, height 5\' 6"  (1.676 m), weight 105.2 kg, SpO2 100 %. Physical Exam:  Constitutional: No distress . Vital signs reviewed. HEENT: EOMI, oral membranes moist Neck: supple Cardiovascular: RRR without murmur. No JVD    Respiratory: CTA Bilaterally without wheezes or rales. Normal effort    GI: BS +, non-tender, non-distended  Musc: No edema or tenderness in extremities.  TTP left 5th Metatarsal/phalanx unchanged Neurological: She isalertand oriented Mild dysarthria Able to follow basic commands without difficulty.  Motor: RUE 5/5.  LUE: 4-4+/5 prox to distal, stable exam  Bilateral LE: 4-/5 HF,KE and 4/5 ADF/PF, stable to improved Skin: Skin iswarmand dry.  Psychiatric: pleasant  Assessment/Plan: 1. Functional deficits secondary to bilateral embolic cerebral infarcts which require 3+ hours per day of interdisciplinary therapy in a comprehensive inpatient rehab setting. Physiatrist is providing  close team supervision and 24 hour management of active medical problems listed below. Physiatrist and rehab team continue to assess barriers to discharge/monitor patient progress toward functional and medical goals.  Function:  Bathing Bathing position   Position: Shower  Bathing parts Body parts bathed by patient: Right arm, Left arm, Chest, Abdomen, Front perineal area, Right upper leg, Left upper leg, Right lower leg, Left lower leg, Buttocks Body parts bathed by helper: Back  Bathing assist Assist Level: Touching or steadying assistance(Pt > 75%), Assistive device Assistive Device Comment: LH sponge    Upper Body Dressing/Undressing Upper body dressing   What is the patient wearing?: Pull over shirt/dress     Pull over shirt/dress - Perfomed by patient: Thread/unthread right sleeve, Thread/unthread left sleeve, Put head through opening, Pull shirt over trunk          Upper body assist Assist Level: Supervision or verbal cues      Lower Body Dressing/Undressing Lower body dressing   What is the patient wearing?: Underwear Underwear - Performed by patient: Thread/unthread right underwear leg, Thread/unthread left underwear leg Underwear - Performed by helper: Pull underwear up/down       Non-skid slipper socks- Performed by helper: Don/doff right sock, Don/doff left sock                  Lower body assist Assist for lower body dressing: Touching or steadying assistance (Pt > 75%)      Toileting Toileting   Toileting steps completed  by patient: Performs perineal hygiene Toileting steps completed by helper: Adjust clothing prior to toileting, Adjust clothing after toileting Toileting Assistive Devices: Grab bar or rail  Toileting assist Assist level: Touching or steadying assistance (Pt.75%)   Transfers Chair/bed transfer   Chair/bed transfer method: Stand pivot Chair/bed transfer assist level: Moderate assist (Pt 50 - 74%/lift or lower)        Locomotion Ambulation Ambulation activity did not occur: Safety/medical concerns(dizziness upon standing)         Wheelchair   Type: Manual Max wheelchair distance: 10 Assist Level: Maximal assistance (Pt 25 - 49%)  Cognition Comprehension Comprehension assist level: Understands basic 90% of the time/cues < 10% of the time  Expression Expression assist level: Expresses basic 90% of the time/requires cueing < 10% of the time.  Social Interaction Social Interaction assist level: Interacts appropriately 90% of the time - Needs monitoring or encouragement for participation or interaction.  Problem Solving Problem solving assist level: Solves basic 90% of the time/requires cueing < 10% of the time  Memory Memory assist level: Recognizes or recalls 25 - 49% of the time/requires cueing 50 - 75% of the time   Medical Problem List and Plan: 1.Functional deficitssecondary tobilateral embolic stroke. Continue CIR 2. DVT Prophylaxis/Anticoagulation: Pharmaceutical:Lovenox 3. Pain Management:Voltaren gel to left foot. -left foot with medial avulsion fx prox 5th phalanx - darco delivered, WBAT LLE  4. Mood:LCSW to follow for evaluation and support. 5. Neuropsych: This patientiscapable of making decisions on herown behalf. 6. Skin/Wound Care:routine pressure relief measures. 7. Fluids/Electrolytes/Nutrition:Monitor I/O. -Encourage p.o. intake. 8. T2DMwith retinopathy: Hgb A1C-7.6.    Avoid metformin due to elevated SCr.    Continued elevation---increased   lantus to 18u qam--CBG's showing some improvement, continue to titrate to desired range 9. HTN: [place] BP bid  On Losartan and Verapamilto beresumed  Hydralazine 10 3 times a day started on 9/2, increased to 25 on 9/3,  Symptomatic Orthostatic hypotension    -improved   -continueTEDS/Abdominal binder ordered, encouraging fluid intake     -Continue to hold  clonidine. 10. Dyslipidemia: Continuelipitor.  12. CKD: Baseline SCr 1.3 to 1.5 range in the past 6 months.  Creatinine 1.28 on 9/4 13. Dyspnea- new onset:   -PT addressing activity tolerance, improving 14.  Nausea: Improved with bowel movements  15. Leukocytosis: Resolved  WBCs 6.8 on 9/4  Afebrile  Continue to monitor 16. ABLA  Hb 11.2 on 9/4  Cont to monitor  LOS (Days) [place] EVALUATION WAS PERFORMED  Meredith Staggers, MD 10/18/2017 10:29 AM

## 2017-10-18 NOTE — Progress Notes (Signed)
Speech Language Pathology Daily Session Note  Patient Details  Name: Chelsea [name] MRN: [id number] Date of Birth: [birth year]  Today's Date: 10/18/2017 SLP Individual Time: 1300-1330 SLP Individual Time Calculation (min): 30 min  Short Term Goals: Week 2: SLP Short Term Goal 1 (Week 2): Patient will demonstrate functional problem solving for mildly complex but familiar tasks with Supervision verbal cues.  SLP Short Term Goal 2 (Week 2): Patient will recall new, daily infomration with Mod I verbal cues for use of compensatory strategies.  SLP Short Term Goal 3 (Week 2): Patient will self-monitor and correct errors during functional tasks with supervision verbal cues.   Skilled Therapeutic Interventions: Skilled treatment session focused on cognitive goals. Upon arrival, patient requested to use the bathroom. SLP facilitated session by providing Min A verbal cues for problem solving during transfer from the bed to the commode and during self-care tasks. Patient's family member present and educated in regards to patient's current memory impairments and strategies to utilize at home to maximize recall and carryover.  She verbalized understanding but will need reinforcement. Patient left upright in wheelchair with all needs within reach and family present. Continue with current plan of care.      Function:   Cognition Comprehension Comprehension assist level: Understands basic 90% of the time/cues < 10% of the time  Expression   Expression assist level: Expresses basic 90% of the time/requires cueing < 10% of the time.  Social Interaction Social Interaction assist level: Interacts appropriately 90% of the time - Needs monitoring or encouragement for participation or interaction.  Problem Solving Problem solving assist level: Solves basic 90% of the time/requires cueing < 10% of the time  Memory Memory assist level: Recognizes or recalls 75 - 89% of the time/requires cueing 10 - 24% of the time     Pain No/Denies Pain   Therapy/Group: Individual Therapy  Spike Desilets 10/18/2017, 3:09 PM

## 2017-10-18 NOTE — Progress Notes (Addendum)
Occupational Therapy Session Note  Patient Details  Name: Chelsea [name] MRN: [id number] Date of Birth: 05/15/1950  Today's Date: 10/18/2017 OT Individual Time: 3094-0768 Session 2: 0881-1031 OT Individual Time Calculation (min): 45 min  Session 2: 45 min   Short Term Goals: Week 2:  OT Short Term Goal 1 (Week 2): Pt will don underwear with (S) OT Short Term Goal 2 (Week 2): Pt will require no more than 1 vc for safety awareness during standing level clothing management OT Short Term Goal 3 (Week 2): Pt will complete toilet transfer with (S)  Skilled Therapeutic Interventions/Progress Updates:    Pt received supine in bed with no c/o pain/ Pt transferred bed > w/c with CGA. Pt and daughter present were transported down to ADL apt and given demo re TTB use for home. Pt's daughter potentially purchasing TTB/BSC at second hand store, will f/u. Pt completed oral care at sink with set up and vc for bimanual UE use. Pt was transported down to therapy gym and completed standing level functional reaching task with R UE. Pt experienced significant postural sway/LOB during reaching outside of BOS with min A provided at trunk. Pt returned to room and left in w/c with chair alarm set.  Session 2: pt received sitting up in w/c with family present requesting shower. Pt completed stand pivot into walk in shower onto TTB with min A with vc for UE placement on grab bar. Pt able to complete UB/LB bathing at seated level with (S) and CGA when standing to wash buttocks. Pt required min A to don underwear seated in w/c. Min A required to power up from w/c for sit > stand. Pt demo improved distal LE reaching, applying lotion to both legs while sitting. Pt c/o fatigue and requesting to return to bed. Pt left with bed alarm set and all needs met.   Therapy Documentation Precautions:  Precautions Precautions: Fall Precaution Comments: R lateral lean Restrictions Weight Bearing Restrictions: No Other  Position/Activity Restrictions: WBAT on L foot   Vital Signs: Therapy Vitals Pulse Rate: 97 Resp: 20 BP: (!) 150/69 Patient Position (if appropriate): Standing Oxygen Therapy SpO2: 100 % O2 Device: Room Air Pain: Pain Assessment Pain Scale: 0-10 Pain Score: 0-No pain ADL: ADL ADL Comments: refer to functional navigator  See Function Navigator for Current Functional Status.   Therapy/Group: Individual Therapy  Curtis Sites 10/18/2017, 9:36 AM

## 2017-10-18 NOTE — Progress Notes (Signed)
Physical Therapy Session Note  Patient Details  Name: Chelsea [name] MRN: [id number] Date of Birth: 1950-04-23  Today's Date: 10/18/2017 PT Individual Time: 9833-8250 PT Individual Time Calculation (min): 68 min   Short Term Goals: Week 2:  PT Short Term Goal 1 (Week 2): pt will perform functional transfers with min A PT Short Term Goal 2 (Week 2): pt will perform gait x 10' in controlled environment with min A  Skilled Therapeutic Interventions/Progress Updates:    pt performs stand pivot transfers throughout session with min/mod A for wt shifts and balance when pivoting.  Pre gait training with stepping fwd/bkwd with alternating LEs multiple repetitions with manual facilitation for wt shifts, grading movement, trunk control.  Pt improves with numerous repetitions in blocked practice.  Seated core stability with trunk PNFs and ball toss.  Gait training 10' x 3 with min/mod A with RW with pt with increased Rt lean when fatigued.  Gait x 25 with RW with manual facilitation for wt shifts and trunk stability. Pt very pleased with progress this session.  kinetron 2 x 3 minutes for continued LE strength and endurance.  Pt left in bed with needs at hand, alarm set.  Therapy Documentation Precautions:  Precautions Precautions: Fall Precaution Comments: R lateral lean Restrictions Weight Bearing Restrictions: No Other Position/Activity Restrictions: WBAT on L foot Pain: Pain Assessment Pain Scale: 0-10 Pain Score: 0-No pain    Therapy/Group: Individual Therapy  Meriem Lemieux 10/18/2017, 10:52 AM

## 2017-10-19 ENCOUNTER — Inpatient Hospital Stay (HOSPITAL_COMMUNITY): Payer: Medicare Other | Admitting: Physical Therapy

## 2017-10-19 DIAGNOSIS — I6349 Cerebral infarction due to embolism of other cerebral artery: Secondary | ICD-10-CM | POA: Diagnosis not present

## 2017-10-19 DIAGNOSIS — G894 Chronic pain syndrome: Secondary | ICD-10-CM | POA: Diagnosis not present

## 2017-10-19 DIAGNOSIS — F410 Panic disorder [episodic paroxysmal anxiety] without agoraphobia: Secondary | ICD-10-CM | POA: Diagnosis not present

## 2017-10-19 DIAGNOSIS — I10 Essential (primary) hypertension: Secondary | ICD-10-CM | POA: Diagnosis not present

## 2017-10-19 DIAGNOSIS — E669 Obesity, unspecified: Secondary | ICD-10-CM | POA: Diagnosis not present

## 2017-10-19 DIAGNOSIS — N179 Acute kidney failure, unspecified: Secondary | ICD-10-CM | POA: Diagnosis not present

## 2017-10-19 DIAGNOSIS — D62 Acute posthemorrhagic anemia: Secondary | ICD-10-CM | POA: Diagnosis not present

## 2017-10-19 DIAGNOSIS — I951 Orthostatic hypotension: Secondary | ICD-10-CM | POA: Diagnosis not present

## 2017-10-19 DIAGNOSIS — E785 Hyperlipidemia, unspecified: Secondary | ICD-10-CM | POA: Diagnosis not present

## 2017-10-19 DIAGNOSIS — E1169 Type 2 diabetes mellitus with other specified complication: Secondary | ICD-10-CM | POA: Diagnosis not present

## 2017-10-19 DIAGNOSIS — I69354 Hemiplegia and hemiparesis following cerebral infarction affecting left non-dominant side: Secondary | ICD-10-CM | POA: Diagnosis not present

## 2017-10-19 LAB — GLUCOSE, CAPILLARY
GLUCOSE-CAPILLARY: 130 mg/dL — AB (ref 70–99)
Glucose-Capillary: 198 mg/dL — ABNORMAL HIGH (ref 70–99)
Glucose-Capillary: 297 mg/dL — ABNORMAL HIGH (ref 70–99)
Glucose-Capillary: 235 mg/dL — ABNORMAL HIGH (ref 70–99)

## 2017-10-19 NOTE — Progress Notes (Signed)
Physical Therapy Session Note  Patient Details  Name: Chelsea [name] MRN: [id number] Date of Birth: Jul 23, 1950  Today's Date: 10/19/2017 PT Individual Time: 1000-1045 PT Individual Time Calculation (min): 45 min   Short Term Goals: Week 2:  PT Short Term Goal 1 (Week 2): pt will perform functional transfers with min A PT Short Term Goal 2 (Week 2): pt will perform gait x 10' in controlled environment with min A  Skilled Therapeutic Interventions/Progress Updates:    Pt received seated in w/c in room, agreeable to PT. No complaints of pain. Stand pivot transfer w/c to mat table with min A. Sit to stand x 5 reps from elevated mat table to RW with min A, focus on forward weight shift and decreased reliance on BLE bracing on table. Attempting standing step-taps, pt reports feeling very nauseous in standing. Seated BP 177/73. Symptoms subside somewhat with seated rest break. Seated reaching with RUE for bean bags outside BOS and across midline with manual cues through RLE for WBing. Stand pivot transfer mat table to w/c with min A. Manual w/c propulsion x 25 ft with BUE and min A for steering. Pt left seated in w/c in room with needs in reach and daughter present. Education with patient that she should not be up ambulating with family over the weekend until family members have been checked off as being safe to assist her.  Therapy Documentation Precautions:  Precautions Precautions: Fall Precaution Comments: R lateral lean Restrictions Weight Bearing Restrictions: No Other Position/Activity Restrictions: WBAT on L foot  See Function Navigator for Current Functional Status.   Therapy/Group: Individual Therapy  Excell Seltzer, PT, DPT  10/19/2017, 12:27 PM

## 2017-10-19 NOTE — Progress Notes (Signed)
Chelsea [name] is a 67 y.o. female Sep 03, 1950 [id number]  Subjective: No new complaints. No new problems. Slept well. Feeling OK.  Eating breakfast.  Her daughter is at bedside.  Objective: Vital signs in last 24 hours: Temp:  [97.6 F (36.4 C)-98.3 F (36.8 C)] 98.2 F (36.8 C) (09/07 1452) Pulse Rate:  [79-88] 80 (09/07 1452) Resp:  [16-19] 18 (09/07 1452) BP: (146-170)/(61-72) 170/64 (09/07 1452) SpO2:  [98 %-99 %] 98 % (09/07 1452) Weight change:  Last BM Date: 10/18/17  Intake/Output from previous day: 09/06 0701 - 09/07 0700 In: 360 [P.O.:360] Out: -  Last cbgs: CBG (last 3)  Recent Labs    10/19/17 0651 10/19/17 1058 10/19/17 1701  GLUCAP 130* 198* 297*     Physical Exam General: No apparent distress   HEENT: not dry Lungs: Normal effort. Lungs clear to auscultation, no crackles or wheezes. Cardiovascular: Regular rate and rhythm, no edema Abdomen: S/NT/ND; BS(+) Musculoskeletal:  unchanged Neurological: No new neurological deficits Wounds: N/A    Skin: clear  Aging changes Mental state: Alert, oriented, cooperative    Lab Results: BMET    Component Value Date/Time   NA 142 10/16/2017 0458   NA 141 09/06/2017 0835   K 3.5 10/16/2017 0458   CL 103 10/16/2017 0458   CO2 30 10/16/2017 0458   GLUCOSE 164 (H) 10/16/2017 0458   BUN 17 10/16/2017 0458   BUN 22 09/06/2017 0835   CREATININE 1.28 (H) 10/16/2017 0458   CREATININE 1.05 12/25/2013 1551   CALCIUM 8.1 (L) 10/16/2017 0458   GFRNONAA 42 (L) 10/16/2017 0458   GFRNONAA 56 (L) 12/25/2013 1551   GFRAA 49 (L) 10/16/2017 0458   GFRAA 64 12/25/2013 1551   CBC    Component Value Date/Time   WBC 6.8 10/16/2017 0458   RBC 3.78 (L) 10/16/2017 0458   HGB 11.2 (L) 10/16/2017 0458   HCT 35.2 (L) 10/16/2017 0458   PLT 268 10/16/2017 0458   MCV 93.1 10/16/2017 0458   MCH 29.6 10/16/2017 0458   MCHC 31.8 10/16/2017 0458   RDW 11.9 10/16/2017 0458   LYMPHSABS 1.4 10/10/2017 0747   MONOABS 0.8  10/10/2017 0747   EOSABS 0.2 10/10/2017 0747   BASOSABS 0.0 10/10/2017 0747    Studies/Results: No results found.  Medications: I have reviewed the patient's current medications.  Assessment/Plan:   1.  Bilateral embolic stroke.  Secondary functional deficit.  Continue CIR 2.  DVT prophylaxis with Lovenox 3.  Pain management with Voltaren gel to the left foot 4.  Type 2 diabetes with retinopathy.  On Lantus and a sliding scale 5.  Hypertension.  Losartan and verapamil.  Hydralazine 3 times a day 6.  Orthostatic hypotension.  TED stockings.  Abdominal binder.  Encouraging fluid intake.  Clonidine is on hold. 7.  Dyslipidemia.  On Lipitor 8.  Leukocytosis.  Resolved 9.  Anemia.  Continue to monitor her hemoglobin.     Length of stay, days: [place] , MD 10/19/2017, 7:27 PM

## 2017-10-20 DIAGNOSIS — D62 Acute posthemorrhagic anemia: Secondary | ICD-10-CM | POA: Diagnosis not present

## 2017-10-20 DIAGNOSIS — G894 Chronic pain syndrome: Secondary | ICD-10-CM | POA: Diagnosis not present

## 2017-10-20 DIAGNOSIS — E1169 Type 2 diabetes mellitus with other specified complication: Secondary | ICD-10-CM | POA: Diagnosis not present

## 2017-10-20 DIAGNOSIS — E785 Hyperlipidemia, unspecified: Secondary | ICD-10-CM | POA: Diagnosis not present

## 2017-10-20 DIAGNOSIS — N179 Acute kidney failure, unspecified: Secondary | ICD-10-CM | POA: Diagnosis not present

## 2017-10-20 DIAGNOSIS — F410 Panic disorder [episodic paroxysmal anxiety] without agoraphobia: Secondary | ICD-10-CM | POA: Diagnosis not present

## 2017-10-20 DIAGNOSIS — I10 Essential (primary) hypertension: Secondary | ICD-10-CM | POA: Diagnosis not present

## 2017-10-20 DIAGNOSIS — I69354 Hemiplegia and hemiparesis following cerebral infarction affecting left non-dominant side: Secondary | ICD-10-CM | POA: Diagnosis not present

## 2017-10-20 DIAGNOSIS — I6349 Cerebral infarction due to embolism of other cerebral artery: Secondary | ICD-10-CM | POA: Diagnosis not present

## 2017-10-20 DIAGNOSIS — E669 Obesity, unspecified: Secondary | ICD-10-CM | POA: Diagnosis not present

## 2017-10-20 LAB — GLUCOSE, CAPILLARY
GLUCOSE-CAPILLARY: 338 mg/dL — AB (ref 70–99)
Glucose-Capillary: 145 mg/dL — ABNORMAL HIGH (ref 70–99)
Glucose-Capillary: 184 mg/dL — ABNORMAL HIGH (ref 70–99)
Glucose-Capillary: 186 mg/dL — ABNORMAL HIGH (ref 70–99)

## 2017-10-20 NOTE — Progress Notes (Signed)
Chelsea [name] is a 67 y.o. female [birth year] [id number]  Subjective: No new complaints. No new problems. Slept well. Feeling OK.  Objective: Vital signs in last 24 hours: Temp:  [97.7 F (36.5 C)-98.8 F (37.1 C)] 97.7 F (36.5 C) (09/08 0507) Pulse Rate:  [82-88] 84 (09/08 1439) Resp:  [17-18] 18 (09/08 1439) BP: (158-173)/(55-88) 158/55 (09/08 1439) SpO2:  [100 %] 100 % (09/08 1439) Weight change:  Last BM Date: 10/18/17  Intake/Output from previous day: No intake/output data recorded. Last cbgs: CBG (last 3)  Recent Labs    10/19/17 2031 10/20/17 0626 10/20/17 1157  GLUCAP 235* 145* 338*     Physical Exam General: No apparent distress   HEENT: not dry Lungs: Normal effort. Lungs clear to auscultation, no crackles or wheezes. Cardiovascular: Regular rate and rhythm, no edema Abdomen: S/NT/ND; BS(+) Musculoskeletal:  unchanged Neurological: No new neurological deficits Wounds: N/A    Skin: clear  Aging changes Mental state: Alert, oriented, cooperative    Lab Results: BMET    Component Value Date/Time   NA 142 10/16/2017 0458   NA 141 09/06/2017 0835   K 3.5 10/16/2017 0458   CL 103 10/16/2017 0458   CO2 30 10/16/2017 0458   GLUCOSE 164 (H) 10/16/2017 0458   BUN 17 10/16/2017 0458   BUN 22 09/06/2017 0835   CREATININE 1.28 (H) 10/16/2017 0458   CREATININE 1.05 12/25/2013 1551   CALCIUM 8.1 (L) 10/16/2017 0458   GFRNONAA 42 (L) 10/16/2017 0458   GFRNONAA 56 (L) 12/25/2013 1551   GFRAA 49 (L) 10/16/2017 0458   GFRAA 64 12/25/2013 1551   CBC    Component Value Date/Time   WBC 6.8 10/16/2017 0458   RBC 3.78 (L) 10/16/2017 0458   HGB 11.2 (L) 10/16/2017 0458   HCT 35.2 (L) 10/16/2017 0458   PLT 268 10/16/2017 0458   MCV 93.1 10/16/2017 0458   MCH 29.6 10/16/2017 0458   MCHC 31.8 10/16/2017 0458   RDW 11.9 10/16/2017 0458   LYMPHSABS 1.4 10/10/2017 0747   MONOABS 0.8 10/10/2017 0747   EOSABS 0.2 10/10/2017 0747   BASOSABS 0.0 10/10/2017  0747    Studies/Results: No results found.  Medications: I have reviewed the patient's current medications.  Assessment/Plan:   1.  Bilateral embolic stroke.  Continue with CIR to correct secondary functional deficit. 2.  DVT prophylaxis with Lovenox 3.  Pain management with Voltaren gel to the left foot 4.  Type 2 diabetes with retinopathy.  On Lantus and insulin sliding scale 5.  Hypertension.  Continue with losartan and verapamil.  Continue with hydralazine 3 times a day 6.  Orthostatic hypotension.  Continue with TED stockings and abdominal binder.  Encouraging good fluid intake.  Her clonidine is on hold. 7.  Dyslipidemia.  Continue with Lipitor 8. Leukocytosis, resolved 9.  Anemia.  Continue to monitor hemoglobin.      Length of stay, days: [place] , MD 10/20/2017, 3:14 PM

## 2017-10-21 ENCOUNTER — Inpatient Hospital Stay (HOSPITAL_COMMUNITY): Payer: Medicare Other | Admitting: Physical Therapy

## 2017-10-21 ENCOUNTER — Encounter (HOSPITAL_COMMUNITY): Payer: Medicare Other

## 2017-10-21 ENCOUNTER — Ambulatory Visit (HOSPITAL_COMMUNITY): Payer: Medicare Other | Admitting: Physical Therapy

## 2017-10-21 ENCOUNTER — Encounter (HOSPITAL_COMMUNITY): Payer: Medicare Other | Admitting: Speech Pathology

## 2017-10-21 DIAGNOSIS — I69354 Hemiplegia and hemiparesis following cerebral infarction affecting left non-dominant side: Secondary | ICD-10-CM | POA: Diagnosis not present

## 2017-10-21 DIAGNOSIS — K5901 Slow transit constipation: Secondary | ICD-10-CM | POA: Diagnosis not present

## 2017-10-21 DIAGNOSIS — S92502S Displaced unspecified fracture of left lesser toe(s), sequela: Secondary | ICD-10-CM | POA: Diagnosis not present

## 2017-10-21 DIAGNOSIS — E1169 Type 2 diabetes mellitus with other specified complication: Secondary | ICD-10-CM | POA: Diagnosis not present

## 2017-10-21 DIAGNOSIS — G894 Chronic pain syndrome: Secondary | ICD-10-CM | POA: Diagnosis not present

## 2017-10-21 DIAGNOSIS — N179 Acute kidney failure, unspecified: Secondary | ICD-10-CM | POA: Diagnosis not present

## 2017-10-21 DIAGNOSIS — I6349 Cerebral infarction due to embolism of other cerebral artery: Secondary | ICD-10-CM | POA: Diagnosis not present

## 2017-10-21 DIAGNOSIS — F410 Panic disorder [episodic paroxysmal anxiety] without agoraphobia: Secondary | ICD-10-CM | POA: Diagnosis not present

## 2017-10-21 DIAGNOSIS — E785 Hyperlipidemia, unspecified: Secondary | ICD-10-CM | POA: Diagnosis not present

## 2017-10-21 DIAGNOSIS — D62 Acute posthemorrhagic anemia: Secondary | ICD-10-CM | POA: Diagnosis not present

## 2017-10-21 LAB — GLUCOSE, CAPILLARY
Glucose-Capillary: 164 mg/dL — ABNORMAL HIGH (ref 70–99)
Glucose-Capillary: 180 mg/dL — ABNORMAL HIGH (ref 70–99)
Glucose-Capillary: 177 mg/dL — ABNORMAL HIGH (ref 70–99)
Glucose-Capillary: 163 mg/dL — ABNORMAL HIGH (ref 70–99)

## 2017-10-21 NOTE — Progress Notes (Signed)
Occupational Therapy Session Note  Patient Details  Name: Chelsea [name] MRN: [id number] Date of Birth: [birth year]  Today's Date: 10/21/2017 OT Individual Time: 1045-1200 OT Individual Time Calculation (min): 75 min    Short Term Goals: Week 2:  OT Short Term Goal 1 (Week 2): Pt will don underwear with (S) OT Short Term Goal 2 (Week 2): Pt will require no more than 1 vc for safety awareness during standing level clothing management OT Short Term Goal 3 (Week 2): Pt will complete toilet transfer with (S)  Skilled Therapeutic Interventions/Progress Updates:    Pt sitting up in w/c agreeable to therapy with no c/o pain. Pt completed stand pivot transfer into walk in shower with CGA. Pt then reported needing to use bathroom and required CGA to take 3 steps with use of grab bar to toilet. CGA provided during standing level clothing management. Min A required to doff LLE boot. Pt transferred back into shower with CGA, experiencing 1 slight LOB with min A provided to correct. Pt completed full UB/LB bathing sit <>stand with (S). Pt edu re energy conservation techniques and optimal times of day to complete high energy expending tasks such as bathing. Pt returned to w/c and applied lotion all over body with set up. With vc for encouragement pt able to thread B LE through underwear, requiring min A to pull up in standing. Pt required seated rest  Break following 3x sit to stand during LB dressing. Pt was transported to therapy gym where she sat edge of mat unsupported and participated in B UE coordination activity of throwing/catching ball. During transfer back to chair pt had heavy LOB and required mod A to correct. Pt returned to room and left supine in bed with all needs met and bed alarm set.   Therapy Documentation Precautions:  Precautions Precautions: Fall Precaution Comments: R lateral lean Restrictions Weight Bearing Restrictions: No Other Position/Activity Restrictions: WBAT on L  foot  Pain: Pain Assessment Pain Scale: 0-10 Pain Score: 0-No pain ADL: ADL ADL Comments: refer to functional navigator  See Function Navigator for Current Functional Status.   Therapy/Group: Individual Therapy  Curtis Sites 10/21/2017, 12:10 PM

## 2017-10-21 NOTE — Progress Notes (Signed)
Speech Language Pathology Daily Session Note  Patient Details  Name: Chelsea [name] MRN: [id number] Date of Birth: 18-Jul-1950  Today's Date: 10/21/2017 SLP Individual Time: 0900-0930 SLP Individual Time Calculation (min): 30 min  Short Term Goals: Week 2: SLP Short Term Goal 1 (Week 2): Patient will demonstrate functional problem solving for mildly complex but familiar tasks with Supervision verbal cues.  SLP Short Term Goal 2 (Week 2): Patient will recall new, daily infomration with Mod I verbal cues for use of compensatory strategies.  SLP Short Term Goal 3 (Week 2): Patient will self-monitor and correct errors during functional tasks with supervision verbal cues.   Skilled Therapeutic Interventions: Skilled treatment session focused on cognitive goals. SLP facilitated session by providing Min A verbal cues for safety during transfer from bed to the wheelchair . SLP also facilitated session by providing Min A verbal and visual cues for problem solving during a mildly complex 6 picture sequencing task. Patient handed off to PT. Continue with current plan of care.      Function:   Cognition Comprehension Comprehension assist level: Understands basic 90% of the time/cues < 10% of the time  Expression   Expression assist level: Expresses basic 90% of the time/requires cueing < 10% of the time.  Social Interaction Social Interaction assist level: Interacts appropriately 90% of the time - Needs monitoring or encouragement for participation or interaction.  Problem Solving Problem solving assist level: Solves basic 75 - 89% of the time/requires cueing 10 - 24% of the time  Memory Memory assist level: Recognizes or recalls 90% of the time/requires cueing < 10% of the time    Pain Pain Assessment Pain Scale: 0-10 Pain Score: 0-No pain  Therapy/Group: Individual Therapy  Odilon Cass 10/21/2017, 11:07 AM

## 2017-10-21 NOTE — Progress Notes (Addendum)
Physical Therapy Session Note  Patient Details  Name: Chelsea [name] MRN: [id number] Date of Birth: October 17, 1950  Today's Date: 10/21/2017 PT Individual Time: [phone] and [id number] PT Individual Time Calculation (min): 49 min and 24 min  Short Term Goals: Week 2:  PT Short Term Goal 1 (Week 2): pt will perform functional transfers with min A PT Short Term Goal 2 (Week 2): pt will perform gait x 10' in controlled environment with min A  Skilled Therapeutic Interventions/Progress Updates:   pt performs gait with RW with mod manual facilitation due to trunkal and Rt LE ataxia.  Attempt gait without RW with Rt UE over PTs shoulders to improve core stability. Pt able to perform with improved grading of movement, still mod A. Pt limited to 10' each gait attempt due to fatigue.  Side stepping with mod manual facilitation with Rt UE over PTs shoulders.  Sit <> stand repetitions with focus on eccentric control with min A.  Seated hip adduction squeeze and hip abd with theraband to improve hip strength.  theraband shoulder diagonals for core strength with seated balance.  Pt performs kinetron 2 x 2 minutes for LE strength and endurance. Pt requires frequent rests throughout session due to c/o fatigue and pt missed final 10 minutes of session due to refusal due to fatigue. Pt left in w/c with needs at hand, alarm set.   Session 2: no c/o pain.  Pt refuses OOB activity but is agreeable to bed level exercise.  Pt performs supine bridge, LTR, bilat PNFs, hip abd/add, SLR all with AAROM.  Pt left in bed with alarm set, needs at hand.  Therapy Documentation Precautions:  Precautions Precautions: Fall Precaution Comments: R lateral lean Restrictions Weight Bearing Restrictions: No Other Position/Activity Restrictions: WBAT on L foot General: PT Amount of Missed Time (min): 10 Minutes PT Missed Treatment Reason: Patient fatigue Pain:  no c/o pain   Therapy/Group: Individual  Therapy  DONAWERTH,KAREN 10/21/2017, 10:19 AM

## 2017-10-21 NOTE — Progress Notes (Signed)
[place] PHYSICAL MEDICINE & REHABILITATION     PROGRESS NOTE  Subjective: Good weekend. Only one episode of dizziness when she got up quickly on Sunday morning. Otherwise feeling well  ROS: Patient denies fever, rash, sore throat, blurred vision, nausea, vomiting, diarrhea, cough, shortness of breath or chest pain, joint or back pain, headache, or mood change.   Objective:  No results found. No results for input(s): WBC, HGB, HCT, PLT in the last 72 hours. No results for input(s): NA, K, CL, GLUCOSE, BUN, CREATININE, CALCIUM in the last 72 hours.  Invalid input(s): CO CBG (last 3)  Recent Labs    10/20/17 2116 10/21/17 0637 10/21/17 1159  GLUCAP 186* 164* 180*    Wt Readings from Last 3 Encounters:  10/09/17 105.2 kg  10/06/17 108.9 kg  10/06/17 109.3 kg     Intake/Output Summary (Last 24 hours) at 10/21/2017 1234 Last data filed at 10/21/2017 0700 Gross per 24 hour  Intake 480 ml  Output -  Net 480 ml    Vital Signs: Blood pressure (!) 174/75, pulse 91, temperature 98.2 F (36.8 C), temperature source Oral, resp. rate 20, height 5\' 6"  (1.676 m), weight 105.2 kg, SpO2 98 %. Physical Exam:  Constitutional: No distress . Vital signs reviewed. HEENT: EOMI, oral membranes moist Neck: supple Cardiovascular: RRR without murmur. No JVD    Respiratory: CTA Bilaterally without wheezes or rales. Normal effort    GI: BS +, non-tender, non-distended  Musc: No edema or tenderness in extremities.  TTP left 5th Metatarsal/phalanx unchanged Neurological: She isalertand oriented Improving dysarthria Motor: RUE 5/5.  LUE: 4-4+/5 prox to distal, stable exam  Bilateral LE: 4-/5 HF,KE and 4/5 ADF/PF,stable Skin: Skin iswarmand dry.  Psychiatric: pleasant  Assessment/Plan: 1. Functional deficits secondary to bilateral embolic cerebral infarcts which require 3+ hours per day of interdisciplinary therapy in a comprehensive inpatient rehab setting. Physiatrist is providing  close team supervision and 24 hour management of active medical problems listed below. Physiatrist and rehab team continue to assess barriers to discharge/monitor patient progress toward functional and medical goals.  Function:  Bathing Bathing position   Position: Shower  Bathing parts Body parts bathed by patient: Right arm, Left arm, Chest, Abdomen, Front perineal area, Right upper leg, Left upper leg, Right lower leg, Left lower leg, Buttocks, Back Body parts bathed by helper: Back  Bathing assist Assist Level: Supervision or verbal cues Assistive Device Comment: LH sponge    Upper Body Dressing/Undressing Upper body dressing   What is the patient wearing?: Pull over shirt/dress     Pull over shirt/dress - Perfomed by patient: Thread/unthread right sleeve, Thread/unthread left sleeve, Put head through opening, Pull shirt over trunk          Upper body assist Assist Level: Set up   Set up : To obtain clothing/put away  Lower Body Dressing/Undressing Lower body dressing   What is the patient wearing?: Underwear Underwear - Performed by patient: Thread/unthread right underwear leg, Thread/unthread left underwear leg, Pull underwear up/down Underwear - Performed by helper: Pull underwear up/down       Non-skid slipper socks- Performed by helper: Don/doff right sock, Don/doff left sock                  Lower body assist Assist for lower body dressing: Supervision or verbal cues      Toileting Toileting   Toileting steps completed by patient: Adjust clothing prior to toileting, Performs perineal hygiene, Adjust clothing after toileting Toileting steps  completed by helper: Adjust clothing prior to toileting, Performs perineal hygiene, Adjust clothing after toileting Toileting Assistive Devices: Grab bar or rail  Toileting assist Assist level: Touching or steadying assistance (Pt.75%)   Transfers Chair/bed transfer   Chair/bed transfer method: Stand pivot Chair/bed  transfer assist level: Supervision or verbal cues Chair/bed transfer assistive device: Armrests     Locomotion Ambulation Ambulation activity did not occur: Safety/medical concerns(dizziness upon standing)         Wheelchair   Type: Manual Max wheelchair distance: 25' Assist Level: Touching or steadying assistance (Pt > 75%)  Cognition Comprehension Comprehension assist level: Understands basic 90% of the time/cues < 10% of the time  Expression Expression assist level: Expresses basic 90% of the time/requires cueing < 10% of the time.  Social Interaction Social Interaction assist level: Interacts appropriately 90% of the time - Needs monitoring or encouragement for participation or interaction.  Problem Solving Problem solving assist level: Solves basic 75 - 89% of the time/requires cueing 10 - 24% of the time  Memory Memory assist level: Recognizes or recalls 90% of the time/requires cueing < 10% of the time   Medical Problem List and Plan: 1.Functional deficitssecondary tobilateral embolic stroke. Continue CIR 2. DVT Prophylaxis/Anticoagulation: Pharmaceutical:Lovenox 3. Pain Management:Voltaren gel to left foot. -left foot with medial avulsion fx prox 5th phalanx - darco shoe/ WBAT LLE  4. Mood:LCSW to follow for evaluation and support. 5. Neuropsych: This patientiscapable of making decisions on herown behalf. 6. Skin/Wound Care:routine pressure relief measures. 7. Fluids/Electrolytes/Nutrition:Monitor I/O. -Encourage p.o. intake. 8. T2DMwith retinopathy: Hgb A1C-7.6.    Avoid metformin due to elevated SCr.    Continued elevation---increased   lantus currently18u qam--CBG's showing improvement. No changes today 9. HTN: Montor BP bid  On Losartan and Verapamilto beresumed  Hydralazine 10 3 times a day started on 9/2, increased to 25 on 9/3,  Symptomatic Orthostatic hypotension    -orthostatic positive  again today but pt reports decreased symptoms   -continueTEDS/Abdominal binder ordered, encouraging fluid intake     -Continue to hold clonidine. 10. Dyslipidemia: Continuelipitor.  12. CKD: Baseline SCr 1.3 to 1.5 range in the past 6 months.  Creatinine 1.28 on 9/4 13. Dyspnea- new onset:   -PT addressing activity tolerance, improving 14.  Nausea: Improved with bowel movements  15. Leukocytosis: Resolved  WBCs 6.8 on 9/4  Afebrile  Continue to monitor 16. ABLA  Hb 11.2 on 9/4  Cont to monitor  LOS (Days) 12 A [place] EVALUATION WAS PERFORMED  Meredith Staggers, MD 10/21/2017 12:34 PM

## 2017-10-22 ENCOUNTER — Ambulatory Visit: Payer: Medicare Other | Admitting: Pharmacist

## 2017-10-22 ENCOUNTER — Inpatient Hospital Stay (HOSPITAL_COMMUNITY): Payer: Medicare Other | Admitting: Physical Therapy

## 2017-10-22 ENCOUNTER — Encounter: Payer: Medicare Other | Admitting: Internal Medicine

## 2017-10-22 ENCOUNTER — Inpatient Hospital Stay (HOSPITAL_COMMUNITY): Payer: Medicare Other

## 2017-10-22 ENCOUNTER — Ambulatory Visit (HOSPITAL_COMMUNITY): Admission: RE | Admit: 2017-10-22 | Payer: Medicare Other | Source: Ambulatory Visit

## 2017-10-22 ENCOUNTER — Inpatient Hospital Stay (HOSPITAL_COMMUNITY): Payer: Medicare Other | Admitting: Speech Pathology

## 2017-10-22 DIAGNOSIS — I69354 Hemiplegia and hemiparesis following cerebral infarction affecting left non-dominant side: Secondary | ICD-10-CM | POA: Diagnosis not present

## 2017-10-22 DIAGNOSIS — F410 Panic disorder [episodic paroxysmal anxiety] without agoraphobia: Secondary | ICD-10-CM | POA: Diagnosis not present

## 2017-10-22 DIAGNOSIS — E785 Hyperlipidemia, unspecified: Secondary | ICD-10-CM | POA: Diagnosis not present

## 2017-10-22 DIAGNOSIS — N179 Acute kidney failure, unspecified: Secondary | ICD-10-CM | POA: Diagnosis not present

## 2017-10-22 DIAGNOSIS — G894 Chronic pain syndrome: Secondary | ICD-10-CM | POA: Diagnosis not present

## 2017-10-22 DIAGNOSIS — E1169 Type 2 diabetes mellitus with other specified complication: Secondary | ICD-10-CM | POA: Diagnosis not present

## 2017-10-22 DIAGNOSIS — K5901 Slow transit constipation: Secondary | ICD-10-CM | POA: Diagnosis not present

## 2017-10-22 DIAGNOSIS — I6349 Cerebral infarction due to embolism of other cerebral artery: Secondary | ICD-10-CM | POA: Diagnosis not present

## 2017-10-22 DIAGNOSIS — S92502S Displaced unspecified fracture of left lesser toe(s), sequela: Secondary | ICD-10-CM | POA: Diagnosis not present

## 2017-10-22 DIAGNOSIS — D62 Acute posthemorrhagic anemia: Secondary | ICD-10-CM | POA: Diagnosis not present

## 2017-10-22 LAB — GLUCOSE, CAPILLARY
GLUCOSE-CAPILLARY: 159 mg/dL — AB (ref 70–99)
GLUCOSE-CAPILLARY: 142 mg/dL — AB (ref 70–99)
Glucose-Capillary: 352 mg/dL — ABNORMAL HIGH (ref 70–99)
Glucose-Capillary: 253 mg/dL — ABNORMAL HIGH (ref 70–99)

## 2017-10-22 NOTE — Patient Care Conference (Signed)
Inpatient RehabilitationTeam Conference and Plan of Care Update Date: 10/22/2017   Time: 2:10 PM    Patient Name: Chelsea [name]      Medical Record Number: [id number]  Date of Birth: 1950/02/22 Sex: Female         Room/Bed: 4W13C/4W13C-01 Payor Info: Payor: MEDICARE / Plan: MEDICARE PART B / Product Type: *No Product type* /    Admitting Diagnosis: TEE CVA  Admit Date/Time:  10/09/2017  5:42 PM Admission Comments: No comment available   Primary Diagnosis:  Stroke due to embolism [facility]) Principal Problem: Stroke due to embolism [facility])  Patient Active Problem List   Diagnosis Date Noted  . Orthostatic hypotension   . Benign essential HTN   . Leukocytosis   . Essential hypertension   . Diabetes mellitus type 2 in obese ([place])   . Stroke due to embolism ([place]) 10/09/2017  . Cerebellar infarct ([place])   . Hypokalemia   . Stage 3 chronic kidney disease ([place])   . Stroke ([place]) 10/06/2017  . Split S2 (second heart sound) 08/08/2017  . Stress 07/27/2015  . Lipodermatosclerosis 04/04/2015  . Osteopenia 06/25/2014  . Depression 08/28/2013  . Health care maintenance 08/28/2013  . Severe obesity (BMI >= 40) ([place]) 03/12/2013  . Asthma, chronic 01/30/2013  . Osteoarthritis 11/08/2011  . GERD (gastroesophageal reflux disease) 01/11/2011  . Diabetic foot ([place]) 08/15/2010  . Allergic rhinitis 09/28/2009  . Personal history of colonic polyps 03/17/2008  . DIABETIC  RETINOPATHY 04/09/2006  . CATARACT NOS 04/09/2006  . FIBROIDS, UTERUS 12/26/2005  . Hyperlipidemia associated with type 2 diabetes mellitus ([place]) 12/26/2005  . ABUSE, ALCOHOL, IN REMISSION 12/26/2005  . Hypertension associated with diabetes ([place]) 12/26/2005  . Type 2 diabetes mellitus with diabetic retinopathy ([place]) 02/12/1993    Expected Discharge Date: Expected Discharge Date: 10/29/17  Team Members Present: Physician leading conference: Dr. Alger Simons Social Worker Present: Ovidio Kin, LCSW Nurse Present: Frances Maywood, RN PT Present: Roderic Ovens, PT OT Present: Other (comment)(Sandra Davis-OT) SLP Present: Weston Anna, SLP PPS Coordinator present : Ileana Ladd, PT     Current Status/Progress Goal Weekly Team Focus  Medical   improving mobility, symptomatic orthostasis with some recent improvement, CBG's with improvment  maximize functional mobility, ability to change positions  bp, dm   Bowel/Bladder   Pt is continent of bowel/bladder. LBM 10/20/2017  Remain continent of bowel/bladder. Maintain regular pattern  Assist with toileting needs PRN   Swallow/Nutrition/ Hydration   Regular textures with thin liquids, intermittent supervision  Mod I  Tolerance of current diet with use of swallowing strategies    ADL's   (S) for bathing at shower level. Transfers still inconsistent d/t dizziness, can perform with (S) but has had several LOB while standing requiring very close (S)  overall mod I goals, shower transfer with supervision  safety awareness, ADL transfers, B UE coordination, LB ADLs   Mobility   mod A transfers and mod/max A gait   downgraded goals so gait with PT only, supervision transfers, need to speak with family about ramp  NMR, balance, gait, family ed   Communication             Safety/Cognition/ Behavioral Observations  Min A  Supervision  basic problem solving, recall and awareness    Pain   No c/o pain  Maintain pain at 0  Assess pain q shift and PRN. Treat as needed   Skin   Pt has no skin issues  Maintain skin intergrity  Assess  skin q shift and interven if necessary       *See Care Plan and progress notes for long and short-term goals.     Barriers to Discharge  Current Status/Progress Possible Resolutions Date Resolved   Physician    Medical stability        see medical progress notes      Nursing                  PT                    OT                  SLP                SW                Discharge Planning/Teaching Needs:  Daughter  arranging 24 hr care and have scheduled family education for 9/16 9-12 prior to discharge home      Team Discussion:  Downgraded gait goal to gait only with PT. Complains of dizziness-MD addressing and reports her orthrostatic BP better. Will need hands on care at discharge due to requiring physical care. Team to adjust goals to [place] assist with transfers. Problem solving, awareness and recall poor. Motivation is poor and needs to be encouraged to participate in therapies. May need ramp to get up 4 stairs at home. Family education scheduled for Monday prior to discharge. May ned more than one day of family education.  Revisions to Treatment Plan:  DC 9/17    Continued Need for Acute Rehabilitation Level of Care: The patient requires daily medical management by a physician with specialized training in physical medicine and rehabilitation for the following conditions: Daily direction of a multidisciplinary physical rehabilitation program to ensure safe treatment while eliciting the highest outcome that is of practical value to the patient.: Yes Daily medical management of patient stability for increased activity during participation in an intensive rehabilitation regime.: Yes Daily analysis of laboratory values and/or radiology reports with any subsequent need for medication adjustment of medical intervention for : Neurological problems   I attest that I was present, lead the team conference, and concur with the assessment and plan of the team.   Elease Hashimoto 10/22/2017, 3:48 PM

## 2017-10-22 NOTE — Progress Notes (Signed)
[place] PHYSICAL MEDICINE & REHABILITATION     PROGRESS NOTE  Subjective: No new complaints. Up with therapies already this morning  ROS: Patient denies fever, rash, sore throat, blurred vision, nausea, vomiting, diarrhea, cough, shortness of breath or chest pain, joint or back pain, headache, or mood change.   Objective:  No results found. No results for input(s): WBC, HGB, HCT, PLT in the last 72 hours. No results for input(s): NA, K, CL, GLUCOSE, BUN, CREATININE, CALCIUM in the last 72 hours.  Invalid input(s): CO CBG (last 3)  Recent Labs    10/21/17 1639 10/21/17 2122 10/22/17 0630  GLUCAP 177* 163* 159*    Wt Readings from Last 3 Encounters:  10/09/17 105.2 kg  10/06/17 108.9 kg  10/06/17 109.3 kg     Intake/Output Summary (Last 24 hours) at 10/22/2017 0903 Last data filed at 10/21/2017 2000 Gross per 24 hour  Intake 240 ml  Output -  Net 240 ml    Vital Signs: Blood pressure (!) 164/66, pulse 86, temperature 98.1 F (36.7 C), temperature source Oral, resp. rate 18, height 5\' 6"  (1.676 m), weight 105.2 kg, SpO2 95 %. Physical Exam:  Constitutional: No distress . Vital signs reviewed. HEENT: EOMI, oral membranes moist Neck: supple Cardiovascular: RRR without murmur. No JVD    Respiratory: CTA Bilaterally without wheezes or rales. Normal effort    GI: BS +, non-tender, non-distended  Musc: No edema or tenderness in extremities.  Left foot less tender. Neurological: She isalertand oriented Improving dysarthria Motor: RUE 5/5.  LUE: 4-4+/5 prox to distal, stable exam  Bilateral LE: 4-/5 HF,KE and 4/5 ADF/PF,stable Skin: Skin iswarmand dry.  Psychiatric: pleasant  Assessment/Plan: 1. Functional deficits secondary to bilateral embolic cerebral infarcts which require 3+ hours per day of interdisciplinary therapy in a comprehensive inpatient rehab setting. Physiatrist is providing close team supervision and 24 hour management of active medical  problems listed below. Physiatrist and rehab team continue to assess barriers to discharge/monitor patient progress toward functional and medical goals.  Function:  Bathing Bathing position   Position: Shower  Bathing parts Body parts bathed by patient: Right arm, Left arm, Chest, Abdomen, Front perineal area, Right upper leg, Left upper leg, Right lower leg, Left lower leg, Buttocks, Back Body parts bathed by helper: Back  Bathing assist Assist Level: Supervision or verbal cues Assistive Device Comment: LH sponge    Upper Body Dressing/Undressing Upper body dressing   What is the patient wearing?: Pull over shirt/dress     Pull over shirt/dress - Perfomed by patient: Thread/unthread right sleeve, Thread/unthread left sleeve, Put head through opening, Pull shirt over trunk          Upper body assist Assist Level: Set up   Set up : To obtain clothing/put away  Lower Body Dressing/Undressing Lower body dressing   What is the patient wearing?: Underwear Underwear - Performed by patient: Thread/unthread right underwear leg, Thread/unthread left underwear leg, Pull underwear up/down Underwear - Performed by helper: Pull underwear up/down       Non-skid slipper socks- Performed by helper: Don/doff right sock, Don/doff left sock                  Lower body assist Assist for lower body dressing: Supervision or verbal cues      Toileting Toileting   Toileting steps completed by patient: Adjust clothing prior to toileting, Performs perineal hygiene, Adjust clothing after toileting Toileting steps completed by helper: Adjust clothing prior to toileting, Performs perineal  hygiene, Adjust clothing after toileting Toileting Assistive Devices: Grab bar or rail  Toileting assist Assist level: Touching or steadying assistance (Pt.75%)   Transfers Chair/bed transfer   Chair/bed transfer method: Stand pivot Chair/bed transfer assist level: Supervision or verbal cues Chair/bed  transfer assistive device: Armrests     Locomotion Ambulation Ambulation activity did not occur: Safety/medical concerns(dizziness upon standing)         Wheelchair   Type: Manual Max wheelchair distance: 25' Assist Level: Touching or steadying assistance (Pt > 75%)  Cognition Comprehension Comprehension assist level: Understands basic 90% of the time/cues < 10% of the time  Expression Expression assist level: Expresses basic 90% of the time/requires cueing < 10% of the time.  Social Interaction Social Interaction assist level: Interacts appropriately 90% of the time - Needs monitoring or encouragement for participation or interaction.  Problem Solving Problem solving assist level: Solves basic 75 - 89% of the time/requires cueing 10 - 24% of the time  Memory Memory assist level: Recognizes or recalls 90% of the time/requires cueing < 10% of the time   Medical Problem List and Plan: 1.Functional deficitssecondary tobilateral embolic stroke. Continue CIR  -team conference today 2. DVT Prophylaxis/Anticoagulation: Pharmaceutical:Lovenox 3. Pain Management:Voltaren gel to left foot. -left avulsion fx prox 5th phalanx - darco shoe/ WBAT LLE  4. Mood:LCSW to follow for evaluation and support. 5. Neuropsych: This patientiscapable of making decisions on herown behalf. 6. Skin/Wound Care:routine pressure relief measures. 7. Fluids/Electrolytes/Nutrition:Monitor I/O. -Encourage p.o. intake. 8. T2DMwith retinopathy: Hgb A1C-7.6.    Avoid metformin due to elevated SCr.    Continued elevation---increased   lantus currently18u qam--CBG's showing improvement but will increase to 20u qam 9. HTN: Montor BP bid  On Losartan and Verapamilto beresumed  Hydralazine 10 3 times a day started on 9/2, increased to 25 on 9/3,  Orthostatic hypotension    -numbers/symptoms improving   -continueTEDS/Abdominal binder ordered,  encouraging fluid intake     -Continue to hold clonidine. 10. Dyslipidemia: Continuelipitor.  12. CKD: Baseline SCr 1.3 to 1.5 range in the past 6 months.  Creatinine 1.28 on 9/4  -recheck labs wednesday 13. Dyspnea- new onset:   -PT addressing activity tolerance, improving 14.  Nausea: Improved with bowel movements  15. Leukocytosis: Resolved  WBCs 6.8 on 9/4  Afebrile  Continue to monitor 16. ABLA  Hb 11.2 on 9/4  Cont to monitor, recheck wednesday   LOS (Days) [place] EVALUATION WAS PERFORMED  Meredith Staggers, MD 10/22/2017 9:03 AM

## 2017-10-22 NOTE — Progress Notes (Signed)
Social Work  Cheris Tweten, Eliezer Champagne  Social Worker  Physical Medicine and Rehabilitation  Patient Care Conference  Cosign Needed  Date of Service:  10/22/2017  3:48 PM          Cosign Needed        Show:Clear all [x] Manual[x] Template[] Copied  Added by: [x] Lurlean Kernen, Gardiner Rhyme, LCSW  [] Hover for details Inpatient RehabilitationTeam Conference and Plan of Care Update Date: 10/22/2017   Time: 2:10 PM      Patient Name: Chelsea [name]      Medical Record Number: [id number]  Date of Birth: 1950/12/30 Sex: Female         Room/Bed: 4W13C/4W13C-01 Payor Info: Payor: MEDICARE / Plan: MEDICARE PART B / Product Type: *No Product type* /     Admitting Diagnosis: TEE CVA  Admit Date/Time:  10/09/2017  5:42 PM Admission Comments: No comment available    Primary Diagnosis:  Stroke due to embolism [facility]) Principal Problem: Stroke due to embolism [facility])       Patient Active Problem List    Diagnosis Date Noted  . Orthostatic hypotension    . Benign essential HTN    . Leukocytosis    . Essential hypertension    . Diabetes mellitus type 2 in obese ([place])    . Stroke due to embolism ([place]) 10/09/2017  . Cerebellar infarct ([place])    . Hypokalemia    . Stage 3 chronic kidney disease ([place])    . Stroke ([place]) 10/06/2017  . Split S2 (second heart sound) 08/08/2017  . Stress 07/27/2015  . Lipodermatosclerosis 04/04/2015  . Osteopenia 06/25/2014  . Depression 08/28/2013  . Health care maintenance 08/28/2013  . Severe obesity (BMI >= 40) ([place]) 03/12/2013  . Asthma, chronic 01/30/2013  . Osteoarthritis 11/08/2011  . GERD (gastroesophageal reflux disease) 01/11/2011  . Diabetic foot ([place]) 08/15/2010  . Allergic rhinitis 09/28/2009  . Personal history of colonic polyps 03/17/2008  . DIABETIC  RETINOPATHY 04/09/2006  . CATARACT NOS 04/09/2006  . FIBROIDS, UTERUS 12/26/2005  . Hyperlipidemia associated with type 2 diabetes mellitus ([place]) 12/26/2005  . ABUSE, ALCOHOL, IN REMISSION 12/26/2005    . Hypertension associated with diabetes ([place]) 12/26/2005  . Type 2 diabetes mellitus with diabetic retinopathy ([place]) 02/12/1993      Expected Discharge Date: Expected Discharge Date: 10/29/17   Team Members Present: Physician leading conference: Dr. Alger Simons Social Worker Present: Ovidio Kin, LCSW Nurse Present: Frances Maywood, RN PT Present: Roderic Ovens, PT OT Present: Other (comment)(Sandra Davis-OT) SLP Present: Weston Anna, SLP PPS Coordinator present : Ileana Ladd, PT       Current Status/Progress Goal Weekly Team Focus  Medical     improving mobility, symptomatic orthostasis with some recent improvement, CBG's with improvment  maximize functional mobility, ability to change positions  bp, dm   Bowel/Bladder     Pt is continent of bowel/bladder. LBM 10/20/2017  Remain continent of bowel/bladder. Maintain regular pattern  Assist with toileting needs PRN   Swallow/Nutrition/ Hydration     Regular textures with thin liquids, intermittent supervision  Mod I  Tolerance of current diet with use of swallowing strategies    ADL's     (S) for bathing at shower level. Transfers still inconsistent d/t dizziness, can perform with (S) but has had several LOB while standing requiring very close (S)  overall mod I goals, shower transfer with supervision  safety awareness, ADL transfers, B UE coordination, LB ADLs   Mobility     mod A transfers and  mod/max A gait   downgraded goals so gait with PT only, supervision transfers, need to speak with family about ramp  NMR, balance, gait, family ed   Communication               Safety/Cognition/ Behavioral Observations   Min A  Supervision  basic problem solving, recall and awareness    Pain     No c/o pain  Maintain pain at 0  Assess pain q shift and PRN. Treat as needed   Skin     Pt has no skin issues  Maintain skin intergrity  Assess skin q shift and interven if necessary      *See Care Plan and progress notes for long  and short-term goals.      Barriers to Discharge   Current Status/Progress Possible Resolutions Date Resolved   Physician     Medical stability        see medical progress notes      Nursing                 PT                    OT                 SLP            SW              Discharge Planning/Teaching Needs:  Daughter arranging 24 hr care and have scheduled family education for 9/16 9-12 prior to discharge home      Team Discussion:  Downgraded gait goal to gait only with PT. Complains of dizziness-MD addressing and reports her orthrostatic BP better. Will need hands on care at discharge due to requiring physical care. Team to adjust goals to [place] assist with transfers. Problem solving, awareness and recall poor. Motivation is poor and needs to be encouraged to participate in therapies. May need ramp to get up 4 stairs at home. Family education scheduled for Monday prior to discharge. May ned more than one day of family education.  Revisions to Treatment Plan:  DC 9/17    Continued Need for Acute Rehabilitation Level of Care: The patient requires daily medical management by a physician with specialized training in physical medicine and rehabilitation for the following conditions: Daily direction of a multidisciplinary physical rehabilitation program to ensure safe treatment while eliciting the highest outcome that is of practical value to the patient.: Yes Daily medical management of patient stability for increased activity during participation in an intensive rehabilitation regime.: Yes Daily analysis of laboratory values and/or radiology reports with any subsequent need for medication adjustment of medical intervention for : Neurological problems     I attest that I was present, lead the team conference, and concur with the assessment and plan of the team.     Elease Hashimoto 10/22/2017, 3:48 PM               Patient ID: Joesph [name], female   DOB:  1950-11-15, 67 y.o.   MRN: [id number]

## 2017-10-22 NOTE — Progress Notes (Signed)
Physical Therapy Session Note  Patient Details  Name: Chelsea [name] MRN: [id number] Date of Birth: 02/29/1950  Today's Date: 10/22/2017 PT Individual Time: 0930-1029 PT Individual Time Calculation (min): 59 min   Short Term Goals: Week 2:  PT Short Term Goal 1 (Week 2): pt will perform functional transfers with min A PT Short Term Goal 2 (Week 2): pt will perform gait x 10' in controlled environment with min A  Skilled Therapeutic Interventions/Progress Updates:   pt performs functional transfers throughout session with min/mod A for balance and wt shifts.  Side stepping with Rt UE around PTs shoulders with mod manual facilitation for wt shifts and trunk control.  Transfer training to sofa, bed and car all with min/mod A for wt shifts and trunk control. Pt self limiting throughout session and requires encouragement to participate. Pt left in bed with needs at hand, alarm set.   Therapy Documentation Precautions:  Precautions Precautions: Fall Precaution Comments: R lateral lean Restrictions Weight Bearing Restrictions: No Other Position/Activity Restrictions: WBAT on L foot Pain: Pain Assessment Pain Scale: 0-10 Pain Score: 0-No pain    Therapy/Group: Individual Therapy  Deaven Barron 10/22/2017, 10:33 AM

## 2017-10-22 NOTE — Progress Notes (Signed)
Physical Therapy Session Note  Patient Details  Name: YATZIRY DEAKINS MRN: [id number] Date of Birth: 1950-03-16  Today's Date: 10/22/2017 PT Individual Time: 1430-1500 PT Individual Time Calculation (min): 30 min   Short Term Goals: Week 2:  PT Short Term Goal 1 (Week 2): pt will perform functional transfers with min A PT Short Term Goal 2 (Week 2): pt will perform gait x 10' in controlled environment with min A  Skilled Therapeutic Interventions/Progress Updates:    no c//o pain.  Pt requesting to use bathroom.  Session focus on functional transfers and activity tolerance.  Pt demos bed mobility with supervision.  Stand/pivot transfers w/c<>bed and w/c<>toilet with close supervision using bed rails, armrests, and grab bar.  W/C mobility with min assist to maintain straight path navigation x50' for mobility, UE strengthening, and overall endurance.  Returned to room at end of session and positioned in bed with call bell in reach and needs met.   Therapy Documentation Precautions:  Precautions Precautions: Fall Precaution Comments: R lateral lean Restrictions Weight Bearing Restrictions: No Other Position/Activity Restrictions: WBAT on L foot   See Function Navigator for Current Functional Status.   Therapy/Group: Individual Therapy  Michel Santee 10/22/2017, 3:37 PM

## 2017-10-22 NOTE — Progress Notes (Signed)
Speech Language Pathology Daily Session Note  Patient Details  Name: Chelsea [name] MRN: [id number] Date of Birth: [birth year]  Today's Date: 10/22/2017 SLP Individual Time: 1345-1425 SLP Individual Time Calculation (min): 40 min  Short Term Goals: Week 2: SLP Short Term Goal 1 (Week 2): Patient will demonstrate functional problem solving for mildly complex but familiar tasks with Supervision verbal cues.  SLP Short Term Goal 2 (Week 2): Patient will recall new, daily infomration with Mod I verbal cues for use of compensatory strategies.  SLP Short Term Goal 3 (Week 2): Patient will self-monitor and correct errors during functional tasks with supervision verbal cues.   Skilled Therapeutic Interventions: Skilled treatment session focused on cognitive goals. SLP facilitated session by providing encouragement and overall Mod A verbal and visual cues for problem solving during a mildly complex task of organizing 6-step picture sequencing cards. Patient reported difficulty with task because she "doesn't do anything at home." Patient left upright in bed with alarm on and all needs within reach. Continue with current plan of care.      Function:   Cognition Comprehension Comprehension assist level: Understands basic 90% of the time/cues < 10% of the time  Expression   Expression assist level: Expresses basic 90% of the time/requires cueing < 10% of the time.  Social Interaction Social Interaction assist level: Interacts appropriately 90% of the time - Needs monitoring or encouragement for participation or interaction.  Problem Solving Problem solving assist level: Solves basic 50 - 74% of the time/requires cueing 25 - 49% of the time  Memory Memory assist level: Recognizes or recalls 75 - 89% of the time/requires cueing 10 - 24% of the time    Pain No/Denies Pain   Therapy/Group: Individual Therapy  Lira Stephen 10/22/2017, 3:07 PM

## 2017-10-22 NOTE — Progress Notes (Signed)
Occupational Therapy Session Note  Patient Details  Name: Chelsea [name] MRN: [id number] Date of Birth: Sep 06, 1950  Today's Date: 10/22/2017 OT Individual Time: 7711-6579 OT Individual Time Calculation (min): 55 min    Short Term Goals: Week 2:  OT Short Term Goal 1 (Week 2): Pt will don underwear with (S) OT Short Term Goal 2 (Week 2): Pt will require no more than 1 vc for safety awareness during standing level clothing management OT Short Term Goal 3 (Week 2): Pt will complete toilet transfer with (S)  Skilled Therapeutic Interventions/Progress Updates:    Pt supine in bed agreeable to therapy with no c/o pain. Pt reported episode of nausea last night. BP was obtained supine in bed and then sitting EOB, the values were 173/67 and 165/81 respectively. Pt reported feeling "swimmy headed" when sitting EOB. Pt changed dress with set up for item retrieval. Pt c/o not receiving breakfast, assisted pt in calling down to get new order placed. D/t lack of breakfast pt refusing any standing activity, agreeing only to NuStep. Pt completed 15 min on NuStep to increase functional activity tolerance. Discussed sitting up in recliner throughout day instead of returning to bed between each session with pt agreeing to try. Pt transferred to recliner with CGA. Pt was left sitting up in recliner with chair alarm set and all needs met.   Therapy Documentation Precautions:  Precautions Precautions: Fall Precaution Comments: R lateral lean Restrictions Weight Bearing Restrictions: No Other Position/Activity Restrictions: WBAT on L foot  Pain: Pain Assessment Pain Scale: 0-10 Pain Score: 0-No pain ADL: ADL ADL Comments: refer to functional navigator  See Function Navigator for Current Functional Status.   Therapy/Group: Individual Therapy  Curtis Sites 10/22/2017, 8:41 AM

## 2017-10-23 ENCOUNTER — Inpatient Hospital Stay (HOSPITAL_COMMUNITY): Payer: Medicare Other | Admitting: Speech Pathology

## 2017-10-23 ENCOUNTER — Inpatient Hospital Stay (HOSPITAL_COMMUNITY): Payer: Medicare Other

## 2017-10-23 DIAGNOSIS — N179 Acute kidney failure, unspecified: Secondary | ICD-10-CM | POA: Diagnosis not present

## 2017-10-23 DIAGNOSIS — I69354 Hemiplegia and hemiparesis following cerebral infarction affecting left non-dominant side: Secondary | ICD-10-CM | POA: Diagnosis not present

## 2017-10-23 DIAGNOSIS — E1169 Type 2 diabetes mellitus with other specified complication: Secondary | ICD-10-CM | POA: Diagnosis not present

## 2017-10-23 DIAGNOSIS — I6349 Cerebral infarction due to embolism of other cerebral artery: Secondary | ICD-10-CM | POA: Diagnosis not present

## 2017-10-23 DIAGNOSIS — E785 Hyperlipidemia, unspecified: Secondary | ICD-10-CM | POA: Diagnosis not present

## 2017-10-23 DIAGNOSIS — K5901 Slow transit constipation: Secondary | ICD-10-CM | POA: Diagnosis not present

## 2017-10-23 DIAGNOSIS — S92502S Displaced unspecified fracture of left lesser toe(s), sequela: Secondary | ICD-10-CM | POA: Diagnosis not present

## 2017-10-23 DIAGNOSIS — F410 Panic disorder [episodic paroxysmal anxiety] without agoraphobia: Secondary | ICD-10-CM | POA: Diagnosis not present

## 2017-10-23 DIAGNOSIS — G894 Chronic pain syndrome: Secondary | ICD-10-CM | POA: Diagnosis not present

## 2017-10-23 DIAGNOSIS — D62 Acute posthemorrhagic anemia: Secondary | ICD-10-CM | POA: Diagnosis not present

## 2017-10-23 LAB — GLUCOSE, CAPILLARY
GLUCOSE-CAPILLARY: 140 mg/dL — AB (ref 70–99)
GLUCOSE-CAPILLARY: 273 mg/dL — AB (ref 70–99)
GLUCOSE-CAPILLARY: 225 mg/dL — AB (ref 70–99)
GLUCOSE-CAPILLARY: 202 mg/dL — AB (ref 70–99)

## 2017-10-23 NOTE — Progress Notes (Signed)
Physical Therapy Session Note  Patient Details  Name: Chelsea [name] MRN: [id number] Date of Birth: 09-Nov-1950  Today's Date: 10/23/2017 PT Individual Time: 1415-1515 PT Individual Time Calculation (min): 60 min   Short Term Goals:  Week 2:  PT Short Term Goal 1 (Week 2): pt will perform functional transfers with min A PT Short Term Goal 2 (Week 2): pt will perform gait x 10' in controlled environment with min A  Skilled Therapeutic Interventions/Progress Updates:   Pt seated in recliner with her nephew visiting.  Pt reported that she was very tired, as she had been OOB since 7:30 this AM.  Squat pivot recliner> w/c to R iwht mod assist, mod cues.  neuromuscular re-education via forced use for w/c propulsion using bil UEs, Theraband wrapped around R rim of w/c.  Standing tolerance activity, arranging 12 colored balls in case per picture, x 2 minutes.   Min cues needed for accuracy.  Pt drifted R in standing with poor awareness, achieving midline orientation with cues, extra time and bil UE support.  Pt stood to compose a peg design as per picture, x 5 minutes with R or LUE support.  Pt needed mod cues for accuracy.   Gait training with RW x 6', with mod/max assist due to over-shift to R.  Pt stated that her L foot did not hurt, but because she was weak on the R, she leans that way.   PT discussed pt's prior activity status with her, at length.  She struggled to get up/down steps at entrance to her home, needing help from a family member if she was particularly tired; she did not leave the house without assistance, so generally got out of the house about 1/wk x previous 6 months.    Pt exhausted and asked to return to bed.  Pt left resting in bed with needs at hand, alarm set.      Therapy Documentation Precautions:  Precautions Precautions: Fall Precaution Comments: R lateral lean Restrictions Weight Bearing Restrictions: No Other Position/Activity Restrictions: WBAT on L foot     See Function Navigator for Current Functional Status.   Therapy/Group: Individual Therapy  Emaree Chiu 10/23/2017, 4:15 PM

## 2017-10-23 NOTE — Progress Notes (Signed)
[place] PHYSICAL MEDICINE & REHABILITATION     PROGRESS NOTE  Subjective: Up with therapy. Intermittently "dizzy" when up  ROS: Patient denies fever, rash, sore throat, blurred vision, nausea, vomiting, diarrhea, cough, shortness of breath or chest pain, joint or back pain, headache, or mood change.   Objective:  No results found. No results for input(s): WBC, HGB, HCT, PLT in the last 72 hours. No results for input(s): NA, K, CL, GLUCOSE, BUN, CREATININE, CALCIUM in the last 72 hours.  Invalid input(s): CO CBG (last 3)  Recent Labs    10/22/17 1642 10/22/17 2123 10/23/17 0612  GLUCAP 253* 142* 140*    Wt Readings from Last 3 Encounters:  10/23/17 105.3 kg  10/06/17 108.9 kg  10/06/17 109.3 kg     Intake/Output Summary (Last 24 hours) at 10/23/2017 0911 Last data filed at 10/22/2017 1300 Gross per 24 hour  Intake 240 ml  Output -  Net 240 ml    Vital Signs: Blood pressure (!) 151/58, pulse 94, temperature 98 F (36.7 C), temperature source Oral, resp. rate 18, height 5\' 6"  (1.676 m), weight 105.3 kg, SpO2 98 %. Physical Exam:  Constitutional: No distress . Vital signs reviewed. HEENT: EOMI, oral membranes moist Neck: supple Cardiovascular: RRR without murmur. No JVD    Respiratory: CTA Bilaterally without wheezes or rales. Normal effort    GI: BS +, non-tender, non-distended  Musc: No edema or tenderness in extremities.  Left foot less tender. Neurological: She isalertand oriented Improving dysarthria Motor: RUE 5/5.  LUE: 4-4+/5 prox to distal, stable exam  Bilateral LE: 4-/5 HF,KE and 4/5 ADF/PF,stable Skin: Skin iswarmand dry.  Psychiatric: pleasant  Assessment/Plan: 1. Functional deficits secondary to bilateral embolic cerebral infarcts which require 3+ hours per day of interdisciplinary therapy in a comprehensive inpatient rehab setting. Physiatrist is providing close team supervision and 24 hour management of active medical problems listed  below. Physiatrist and rehab team continue to assess barriers to discharge/monitor patient progress toward functional and medical goals.  Function:  Bathing Bathing position   Position: Shower  Bathing parts Body parts bathed by patient: Right arm, Left arm, Chest, Abdomen, Front perineal area, Right upper leg, Left upper leg, Right lower leg, Left lower leg, Buttocks, Back Body parts bathed by helper: Back  Bathing assist Assist Level: Supervision or verbal cues Assistive Device Comment: LH sponge    Upper Body Dressing/Undressing Upper body dressing   What is the patient wearing?: Pull over shirt/dress     Pull over shirt/dress - Perfomed by patient: Thread/unthread right sleeve, Thread/unthread left sleeve, Put head through opening, Pull shirt over trunk          Upper body assist Assist Level: Set up   Set up : To obtain clothing/put away  Lower Body Dressing/Undressing Lower body dressing   What is the patient wearing?: Underwear Underwear - Performed by patient: Thread/unthread right underwear leg, Thread/unthread left underwear leg, Pull underwear up/down Underwear - Performed by helper: Pull underwear up/down       Non-skid slipper socks- Performed by helper: Don/doff right sock, Don/doff left sock                  Lower body assist Assist for lower body dressing: Supervision or verbal cues      Toileting Toileting   Toileting steps completed by patient: Adjust clothing prior to toileting, Performs perineal hygiene, Adjust clothing after toileting Toileting steps completed by helper: Adjust clothing after toileting Toileting Assistive Devices: Grab bar  or rail  Toileting assist Assist level: Supervision or verbal cues   Transfers Chair/bed transfer   Chair/bed transfer method: Stand pivot Chair/bed transfer assist level: Supervision or verbal cues Chair/bed transfer assistive device: Armrests, Medical sales representative Ambulation activity  did not occur: Safety/medical concerns(dizziness upon standing)         Wheelchair   Type: Manual Max wheelchair distance: 25' Assist Level: Touching or steadying assistance (Pt > 75%)  Cognition Comprehension Comprehension assist level: Understands basic 90% of the time/cues < 10% of the time  Expression Expression assist level: Expresses basic 90% of the time/requires cueing < 10% of the time.  Social Interaction Social Interaction assist level: Interacts appropriately 90% of the time - Needs monitoring or encouragement for participation or interaction.  Problem Solving Problem solving assist level: Solves basic 50 - 74% of the time/requires cueing 25 - 49% of the time  Memory Memory assist level: Recognizes or recalls 75 - 89% of the time/requires cueing 10 - 24% of the time   Medical Problem List and Plan: 1.Functional deficitssecondary tobilateral embolic stroke. Continue CIR 2. DVT Prophylaxis/Anticoagulation: Pharmaceutical:Lovenox 3. Pain Management:Voltaren gel to left foot. -left avulsion fx prox 5th phalanx - darco shoe/ WBAT LLE  4. Mood:LCSW to follow for evaluation and support. 5. Neuropsych: This patientiscapable of making decisions on herown behalf. 6. Skin/Wound Care:routine pressure relief measures. 7. Fluids/Electrolytes/Nutrition:Monitor I/O. -Encourage p.o. intake. 8. T2DMwith retinopathy: Hgb A1C-7.6.    Avoid metformin due to elevated SCr.    Continued elevation---increased   lantus currently18u qam--CBG's showing improvement but will increase to 20u qam 9. HTN: [place] BP bid  On Losartan and Verapamilto beresumed  Hydralazine 10 3 times a day started on 9/2, increased to 25 on 9/3,  Orthostatic hypotension    -VS do not suggest orthostasis at this time   -continueTEDS/Abdominal binder ordered, encouraging fluid intake     -Continue to hold clonidine.   -non-organic component to  dizziness? 10. Dyslipidemia: Continuelipitor.  12. CKD: Baseline SCr 1.3 to 1.5 range in the past 6 months.  Creatinine 1.28 on 9/4  -labs pending 13. Dyspnea- new onset:   -PT addressing activity tolerance, improving 14.  Nausea: Improved with bowel movements  15. Leukocytosis: Resolved  WBCs 6.8 on 9/4  Afebrile  Continue to monitor 16. ABLA  Hb 11.2 on 9/4  Cont to monitor, labs pending  LOS (Days) [place] EVALUATION WAS PERFORMED  Meredith Staggers, MD 10/23/2017 9:11 AM

## 2017-10-23 NOTE — Progress Notes (Signed)
Speech Language Pathology Weekly Progress and Session Note  Patient Details  Name: Chelsea [name] MRN: [id number] Date of Birth: [birth year]  Beginning of progress report period: October 16, 2017 End of progress report period: October 23, 2017  Today's Date: 10/23/2017 SLP Individual Time: 9233-0076 SLP Individual Time Calculation (min): 26 min  Short Term Goals: Week 2: SLP Short Term Goal 1 (Week 2): Patient will demonstrate functional problem solving for mildly complex but familiar tasks with Supervision verbal cues.  SLP Short Term Goal 1 - Progress (Week 2): Not met SLP Short Term Goal 2 (Week 2): Patient will recall new, daily infomration with Mod I verbal cues for use of compensatory strategies.  SLP Short Term Goal 2 - Progress (Week 2): Not met SLP Short Term Goal 3 (Week 2): Patient will self-monitor and correct errors during functional tasks with supervision verbal cues.  SLP Short Term Goal 3 - Progress (Week 2): Met    New Short Term Goals: Week 3: SLP Short Term Goal 1 (Week 3): Patient will demonstrate functional problem solving for mildly complex but familiar tasks with Supervision verbal cues.  SLP Short Term Goal 2 (Week 3): Patient will recall new, daily infomration with Mod I verbal cues for use of compensatory strategies.  SLP Short Term Goal 3 (Week 3): Patient will self-monitor and correct errors during functional tasks with Mod I.   Weekly Progress Updates: Patient has made minimal and inconsistent gains and has met 1 of 3 STG's this reporting period. Currently, patient requires overall Min A verbal cues to complete functional and familiar tasks safely in regards to complex problem solving, recall, and awareness.  Patient's function can be impacted by fatigue and motivation at times. Patient and family education ongoing. Patient would benefit from continued skilled SLP intervention to maximize her cognitive function prior to discharge.      Intensity: Minumum  of 1-2 x/day, 30 to 90 minutes Frequency: 3 to 5 out of 7 days Duration/Length of Stay: 10/29/17 Treatment/Interventions: Cognitive remediation/compensation;Environmental controls;Speech/Language facilitation;Therapeutic Activities;Patient/family education;Functional tasks;Cueing hierarchy;Dysphagia/aspiration precaution training   Daily Session  Skilled Therapeutic Interventions:  Skilled treatment session focused on cognitive goals. SLP facilitated session by providing Min A question cues for recall of procedures to a previously learned task along with Min A verbal cues for complex problem solving with task. Patient left upright in recliner with all needs within reach. Continue with current plan of care.       Function:    Cognition Comprehension Comprehension assist level: Understands basic 90% of the time/cues < 10% of the time  Expression   Expression assist level: Expresses basic 90% of the time/requires cueing < 10% of the time.  Social Interaction Social Interaction assist level: Interacts appropriately 90% of the time - Needs monitoring or encouragement for participation or interaction.  Problem Solving Problem solving assist level: Solves basic 50 - 74% of the time/requires cueing 25 - 49% of the time  Memory Memory assist level: Recognizes or recalls 75 - 89% of the time/requires cueing 10 - 24% of the time   Pain No/Denies Pain   Therapy/Group: Individual Therapy  Jazariah Teall 10/23/2017, 3:27 PM

## 2017-10-23 NOTE — Progress Notes (Signed)
Occupational Therapy Session Note  Patient Details  Name: Chelsea [name] MRN: [id number] Date of Birth: 05-31-1950  Today's Date: 10/23/2017 OT Individual Time: 0945-1100 OT Individual Time Calculation (min): 75 min    Short Term Goals: Week 2:  OT Short Term Goal 1 (Week 2): Pt will don underwear with (S) OT Short Term Goal 2 (Week 2): Pt will require no more than 1 vc for safety awareness during standing level clothing management OT Short Term Goal 3 (Week 2): Pt will complete toilet transfer with (S)  Skilled Therapeutic Interventions/Progress Updates:    Pt sitting up in w/c agreeable to therapy and no c/o pain. Pt required mod A to don B shoes. Pt was transported to therapy gym and set up to play Wii Bowling for dynamic standing balance and increase R UE coordination. Min A required during standing and unilateral support on RW for up to 2 min at a time. Pt completed 1/2 game sitting and 1/2 standing. Pt then completed standing level [place] game with a focus on intentional and controlled R UE [place]. Pt required min A throughout for control and vc for bimanual grasping of blocking to increase accuracy. Pt was able to stand for 3 minutes at a time and required frequent seated rest breaks. Pt was returned to room and completed stand pivot transfer to recliner with min A. Pt left sitting up with chair alarm set and all needs met.   Therapy Documentation Precautions:  Precautions Precautions: Fall Precaution Comments: R lateral lean Restrictions Weight Bearing Restrictions: No Other Position/Activity Restrictions: WBAT on L foot  Pain: Pain Assessment Pain Scale: 0-10 Pain Score: 0-No pain ADL: ADL ADL Comments: refer to functional navigator  See Function Navigator for Current Functional Status.   Therapy/Group: Individual Therapy  Curtis Sites 10/23/2017, 12:26 PM

## 2017-10-23 NOTE — Progress Notes (Signed)
Speech Language Pathology Daily Session Note  Patient Details  Name: Chelsea [name] MRN: [id number] Date of Birth: 10-07-1950  Today's Date: 10/23/2017 SLP Individual Time: 0730-0800 SLP Individual Time Calculation (min): 30 min  Short Term Goals: Week 2: SLP Short Term Goal 1 (Week 2): Patient will demonstrate functional problem solving for mildly complex but familiar tasks with Supervision verbal cues.  SLP Short Term Goal 2 (Week 2): Patient will recall new, daily infomration with Mod I verbal cues for use of compensatory strategies.  SLP Short Term Goal 3 (Week 2): Patient will self-monitor and correct errors during functional tasks with supervision verbal cues.   Skilled Therapeutic Interventions: Skilled treatment session focused on cognitive goals. SLP facilitated session by providing Min A verbal cues for problem solving and recall during a mildly complex, novel task. Patient demonstrated increased motivation and engagement with this task compared to yesterday's session. Patient left upright in wheelchair with alarm on and all needs within reach. Continue with current plan of care.      Function:   Cognition Comprehension Comprehension assist level: Understands basic 90% of the time/cues < 10% of the time  Expression   Expression assist level: Expresses basic 90% of the time/requires cueing < 10% of the time.  Social Interaction Social Interaction assist level: Interacts appropriately 90% of the time - Needs monitoring or encouragement for participation or interaction.  Problem Solving Problem solving assist level: Solves basic 50 - 74% of the time/requires cueing 25 - 49% of the time  Memory Memory assist level: Recognizes or recalls 75 - 89% of the time/requires cueing 10 - 24% of the time    Pain No/Denies Pain   Therapy/Group: Individual Therapy  Tashanda Fuhrer 10/23/2017, 3:18 PM

## 2017-10-24 ENCOUNTER — Inpatient Hospital Stay (HOSPITAL_COMMUNITY): Payer: Medicare Other

## 2017-10-24 ENCOUNTER — Inpatient Hospital Stay (HOSPITAL_COMMUNITY): Payer: Medicare Other | Admitting: Physical Therapy

## 2017-10-24 ENCOUNTER — Inpatient Hospital Stay (HOSPITAL_COMMUNITY): Payer: Medicare Other | Admitting: Speech Pathology

## 2017-10-24 DIAGNOSIS — D62 Acute posthemorrhagic anemia: Secondary | ICD-10-CM | POA: Diagnosis not present

## 2017-10-24 DIAGNOSIS — K5901 Slow transit constipation: Secondary | ICD-10-CM | POA: Diagnosis not present

## 2017-10-24 DIAGNOSIS — E785 Hyperlipidemia, unspecified: Secondary | ICD-10-CM | POA: Diagnosis not present

## 2017-10-24 DIAGNOSIS — I6349 Cerebral infarction due to embolism of other cerebral artery: Secondary | ICD-10-CM | POA: Diagnosis not present

## 2017-10-24 DIAGNOSIS — G894 Chronic pain syndrome: Secondary | ICD-10-CM | POA: Diagnosis not present

## 2017-10-24 DIAGNOSIS — I69354 Hemiplegia and hemiparesis following cerebral infarction affecting left non-dominant side: Secondary | ICD-10-CM | POA: Diagnosis not present

## 2017-10-24 DIAGNOSIS — E1169 Type 2 diabetes mellitus with other specified complication: Secondary | ICD-10-CM | POA: Diagnosis not present

## 2017-10-24 DIAGNOSIS — F410 Panic disorder [episodic paroxysmal anxiety] without agoraphobia: Secondary | ICD-10-CM | POA: Diagnosis not present

## 2017-10-24 DIAGNOSIS — N179 Acute kidney failure, unspecified: Secondary | ICD-10-CM | POA: Diagnosis not present

## 2017-10-24 DIAGNOSIS — S92502S Displaced unspecified fracture of left lesser toe(s), sequela: Secondary | ICD-10-CM | POA: Diagnosis not present

## 2017-10-24 LAB — GLUCOSE, CAPILLARY
GLUCOSE-CAPILLARY: 183 mg/dL — AB (ref 70–99)
Glucose-Capillary: 163 mg/dL — ABNORMAL HIGH (ref 70–99)
Glucose-Capillary: 277 mg/dL — ABNORMAL HIGH (ref 70–99)
Glucose-Capillary: 239 mg/dL — ABNORMAL HIGH (ref 70–99)

## 2017-10-24 NOTE — Progress Notes (Signed)
Physical Therapy Weekly Progress Note  Patient Details  Name: Chelsea [name] MRN: [id number] Date of Birth: 10/06/1950  Beginning of progress report period: October 17, 2017 End of progress report period: October 24, 2017  Today's Date: 10/24/2017 PT Individual Time: 1430-1510 PT Individual Time Calculation (min): 40 min   Pt performs stair negotiation 2 8'' steps with bilat handrails with mod A for wt shifts, max cues for safety.  Pt then reports her stairs at home have no railings. Pt performs step ups to 4'' step without handrails with +2 mod/max A x 2 attempts.  Pt states she will have "lots" of people to help her get into the house.  Functional transfers in ADL apartment to bed and sofa with min A for transfers, supervision for bed mobility.  Pt with need to toilet.  Pt min A toilet transfer with use of grab bar, total A hygiene, mod A clothing management.  Pt left in chair with alarm set, needs at hand.  Patient has met 0 of 2 short term goals.  Pt making slow progress and fluctuates with assistance needed due to dizziness and fatigue.  Patient continues to demonstrate the following deficits muscle weakness, impaired timing and sequencing, abnormal tone, unbalanced muscle activation, decreased coordination and decreased motor planning, decreased attention, decreased awareness, decreased problem solving, decreased safety awareness, decreased memory and delayed processing and decreased sitting balance, decreased standing balance, decreased postural control and decreased balance strategies and therefore will continue to benefit from skilled PT intervention to increase functional independence with mobility.  Patient not progressing toward long term goals.  See goal revision..  Continue plan of care.  PT Short Term Goals Week 2:  PT Short Term Goal 1 (Week 2): pt will perform functional transfers with min A PT Short Term Goal 1 - Progress (Week 2): Progressing toward goal PT Short Term  Goal 2 (Week 2): pt will perform gait x 10' in controlled environment with min A PT Short Term Goal 2 - Progress (Week 2): Progressing toward goal Week 3:  PT Short Term Goal 1 (Week 3): = LTG  Skilled Therapeutic Interventions/Progress Updates:  Ambulation/gait training;Community reintegration;DME/adaptive equipment instruction;Neuromuscular re-education;Psychosocial support;Stair training;UE/LE Strength taining/ROM;Wheelchair propulsion/positioning;Balance/vestibular training;Discharge planning;Functional electrical stimulation;Pain management;Therapeutic Activities;UE/LE Coordination activities;Cognitive remediation/compensation;Functional mobility training;Patient/family education;Splinting/orthotics;Therapeutic Exercise;Visual/perceptual remediation/compensation   Therapy Documentation Precautions:  Precautions Precautions: Fall Precaution Comments: R lateral lean Restrictions Weight Bearing Restrictions: No Other Position/Activity Restrictions: WBAT on L foot Pain:  no c/o pain  See Function Navigator for Current Functional Status.  Therapy/Group: Individual Therapy  , 10/24/2017, 3:13 PM  

## 2017-10-24 NOTE — Progress Notes (Signed)
[facility] PHYSICAL MEDICINE & REHABILITATION     PROGRESS NOTE  Subjective: Pt up in chair. Upset because daughter was in the ED last night with a "locked up knee". Daughter usually visits her in the mornings  ROS: Patient denies fever, rash, sore throat, blurred vision, nausea, vomiting, diarrhea, cough, shortness of breath or chest pain, joint or back pain, headache, or mood change.   Objective:  No results found. No results for input(s): WBC, HGB, HCT, PLT in the last 72 hours. No results for input(s): NA, K, CL, GLUCOSE, BUN, CREATININE, CALCIUM in the last 72 hours.  Invalid input(s): CO CBG (last 3)  Recent Labs    10/23/17 1644 10/23/17 2132 10/24/17 0650  GLUCAP 225* 202* 163*    Wt Readings from Last 3 Encounters:  10/23/17 105.3 kg  10/06/17 108.9 kg  10/06/17 109.3 kg     Intake/Output Summary (Last 24 hours) at 10/24/2017 0854 Last data filed at 10/23/2017 1756 Gross per 24 hour  Intake 360 ml  Output -  Net 360 ml    Vital Signs: Blood pressure (!) 167/70, pulse (!) 102, temperature 98.2 F (36.8 C), temperature source Oral, resp. rate 18, height 5\' 6"  (1.676 m), weight 105.3 kg, SpO2 99 %. Physical Exam:  Constitutional: No distress . Vital signs reviewed. HEENT: EOMI, oral membranes moist Neck: supple Cardiovascular: RRR without murmur. No JVD    Respiratory: CTA Bilaterally without wheezes or rales. Normal effort    GI: BS +, non-tender, non-distended  Musc: No edema or tenderness in extremities.  Left foot less tender. Neurological: She isalertand oriented Improving dysarthria Motor: RUE 5/5.  LUE: 4-4+/5 prox to distal, stable exam  Bilateral LE: 4-/5 HF,KE and 4/5 ADF/PF,stable Skin: Skin iswarmand dry.  Psychiatric: pleasant  Assessment/Plan: 1. Functional deficits secondary to bilateral embolic cerebral infarcts which require 3+ hours per day of interdisciplinary therapy in a comprehensive inpatient rehab setting. Physiatrist is  providing close team supervision and 24 hour management of active medical problems listed below. Physiatrist and rehab team continue to assess barriers to discharge/monitor patient progress toward functional and medical goals.  Function:  Bathing Bathing position   Position: Shower  Bathing parts Body parts bathed by patient: Right arm, Left arm, Chest, Abdomen, Front perineal area, Right upper leg, Left upper leg, Right lower leg, Left lower leg, Buttocks, Back Body parts bathed by helper: Back  Bathing assist Assist Level: Supervision or verbal cues Assistive Device Comment: LH sponge    Upper Body Dressing/Undressing Upper body dressing   What is the patient wearing?: Pull over shirt/dress     Pull over shirt/dress - Perfomed by patient: Thread/unthread right sleeve, Thread/unthread left sleeve, Put head through opening, Pull shirt over trunk          Upper body assist Assist Level: Set up   Set up : To obtain clothing/put away  Lower Body Dressing/Undressing Lower body dressing   What is the patient wearing?: Underwear Underwear - Performed by patient: Thread/unthread right underwear leg, Thread/unthread left underwear leg, Pull underwear up/down Underwear - Performed by helper: Pull underwear up/down       Non-skid slipper socks- Performed by helper: Don/doff right sock, Don/doff left sock                  Lower body assist Assist for lower body dressing: Supervision or verbal cues      Toileting Toileting   Toileting steps completed by patient: Adjust clothing prior to toileting, Performs perineal  hygiene, Adjust clothing after toileting Toileting steps completed by helper: Adjust clothing after toileting Toileting Assistive Devices: Grab bar or rail  Toileting assist Assist level: Supervision or verbal cues   Transfers Chair/bed transfer   Chair/bed transfer method: Stand pivot Chair/bed transfer assist level: Moderate assist (Pt 50 - 74%/lift or  lower) Chair/bed transfer assistive device: Armrests, Medical sales representative Ambulation activity did not occur: Safety/medical concerns(dizziness upon standing)   Max distance: 6 Assist level: Maximal assist (Pt 25 - 49%)(+ a person following with w/c)   Wheelchair   Type: Manual Max wheelchair distance: 50 Assist Level: Touching or steadying assistance (Pt > 75%)  Cognition Comprehension Comprehension assist level: Understands basic 90% of the time/cues < 10% of the time  Expression Expression assist level: Expresses basic 50 - 74% of the time/requires cueing 25 - 49% of the time. Needs to repeat parts of sentences.  Social Interaction Social Interaction assist level: Interacts appropriately with others - No medications needed.  Problem Solving Problem solving assist level: Solves basic 50 - 74% of the time/requires cueing 25 - 49% of the time  Memory Memory assist level: Recognizes or recalls 75 - 89% of the time/requires cueing 10 - 24% of the time   Medical Problem List and Plan: 1.Functional deficitssecondary tobilateral embolic stroke. Continue CIR 2. DVT Prophylaxis/Anticoagulation: Pharmaceutical:Lovenox 3. Pain Management:Voltaren gel to left foot. -left avulsion fx prox 5th phalanx - darco shoe/ WBAT LLE  4. Mood:LCSW to follow for evaluation and support. 5. Neuropsych: This patientiscapable of making decisions on herown behalf. 6. Skin/Wound Care:routine pressure relief measures. 7. Fluids/Electrolytes/Nutrition:Monitor I/O. -Encourage p.o. intake. 8. T2DMwith retinopathy: Hgb A1C-7.6.    Avoid metformin due to elevated SCr.    increased  lantus to 20u qam on 9/11---observe for pattern 9. HTN: Montor BP bid  On Losartan and Verapamilto beresumed  Hydralazine 10 3 times a day started on 9/2, increased to 25 on 9/3,  Orthostatic hypotension    -VS do not suggest orthostasis at this  time   -continueTEDS/Abdominal binder ordered, encouraging fluid intake     -Continue to hold clonidine.   -non-organic component to dizziness? 10. Dyslipidemia: Continuelipitor.  12. CKD: Baseline SCr 1.3 to 1.5 range in the past 6 months.  Creatinine 1.28 on 9/4  -check labs again tomorrow 13. Dyspnea- new onset:   -PT addressing activity tolerance, improving 14.  Nausea: Improved with bowel movements  15. Leukocytosis: Resolved  WBCs 6.8 on 9/4  Afebrile  Continue to monitor 16. ABLA  Hb 11.2 on 9/4  Cont to monitor, labs tomorrow   LOS (Days) [place] EVALUATION WAS PERFORMED  Meredith Staggers, MD 10/24/2017 8:54 AM

## 2017-10-24 NOTE — Progress Notes (Signed)
Speech Language Pathology Daily Session Note  Patient Details  Name: Chelsea [name] MRN: [id number] Date of Birth: 11-07-1950  Today's Date: 10/24/2017 SLP Individual Time: 8264-1583 SLP Individual Time Calculation (min): 45 min  Short Term Goals: Week 3: SLP Short Term Goal 1 (Week 3): Patient will demonstrate functional problem solving for mildly complex but familiar tasks with Supervision verbal cues.  SLP Short Term Goal 2 (Week 3): Patient will recall new, daily infomration with Mod I verbal cues for use of compensatory strategies.  SLP Short Term Goal 3 (Week 3): Patient will self-monitor and correct errors during functional tasks with Mod I.   Skilled Therapeutic Interventions: Skilled treatment session focused on cognitive goals. Upon arrival, patient requested to use the bathroom. SLP facilitated session by providing Mod A verbal cues for problem solving with transfer from wheelchair to commode and for self-care during tasks. Patient reported she was not feeling well today due to anxiety over family issues. SLP also facilitated session by providing Min A verbal cues for forming associations to maximize recall and carryover of novel information. Patient able to recall novel items after a 10 minute delay with Min-Mod A verbal cues.      Function:  Cognition Comprehension Comprehension assist level: Understands basic 90% of the time/cues < 10% of the time  Expression   Expression assist level: Expresses basic 50 - 74% of the time/requires cueing 25 - 49% of the time. Needs to repeat parts of sentences.  Social Interaction Social Interaction assist level: Interacts appropriately with others - No medications needed.  Problem Solving Problem solving assist level: Solves basic 50 - 74% of the time/requires cueing 25 - 49% of the time  Memory Memory assist level: Recognizes or recalls 75 - 89% of the time/requires cueing 10 - 24% of the time    Pain No/Denies Pain   Therapy/Group:  Individual Therapy  Wendelin Reader 10/24/2017, 12:05 PM

## 2017-10-24 NOTE — Progress Notes (Signed)
Occupational Therapy Weekly Progress Note  Patient Details  Name: Chelsea [name] MRN: [id number] Date of Birth: [birth year]  Beginning of progress report period: October 10, 2017 End of progress report period: October 24, 2017  Today's Date: 10/24/2017 OT Individual Time: Session 1: 730-830 Session 2: 1530-1610 OT Individual Time Calculation (min): Session 1: 60 min Session 2: 40 min    Patient has met 1 of 3 short term goals.  Pt remains limited by her dizziness and BP drops in standing that limits her ability to perform ADL transfers with independence expected. Pt has made great gains in her independence with ADL routine and with transfers but d/t dizziness, she has had several LOB during standing.   Patient continues to demonstrate the following deficits: muscle weakness, activity tolerance, decreased coordination, standing balance and therefore will continue to benefit from skilled OT intervention to enhance overall performance with BADL.  Patient not progressing toward long term goals.  See goal revision..  Plan of care revisions: Goals downgraded to (S).  OT Short Term Goals Week 2:  OT Short Term Goal 1 (Week 2): Pt will don underwear with (S) OT Short Term Goal 1 - Progress (Week 2): Not met OT Short Term Goal 2 (Week 2): Pt will require no more than 1 vc for safety awareness during standing level clothing management OT Short Term Goal 2 - Progress (Week 2): Met OT Short Term Goal 3 (Week 2): Pt will complete toilet transfer with (S) OT Short Term Goal 3 - Progress (Week 2): Not met Week 3:  OT Short Term Goal 1 (Week 3): STG=LTG d/t ELOS  Skilled Therapeutic Interventions/Progress Updates:      Pt sitting EOB agreeable to therapy. Pt upset about daughter coming to ED last night with emotional support provided. Pt transferred to w/c with CGA provided for safety only. No reports of dizziness this morning. Pt set up at sink to perform UB bathing and grooming tasks. While sitting in  w/c pt applied lotion to B legs and feet reaching distally with (S). Edu provided re importance of checking on feet at home with decreased circulation. B teds were donned with total A. Min A provided to prop pt foot up on trash can to don L boot. Pt becoming frustrated quickly and requiring mod A overall to don boot. Pt provided with long handled shoe horn to don R shoe, with mod A overall. Pt was transported down to therapy gym and completed standing level functional reaching task with R UE and unilateral support. Pt required min A to CGA for static balance support. Pt demo improved standing tolerance this session. Pt was returned to room and left sitting up in w/c with chair alarm set and all needs met.   Session 2: Pt received sitting in w/c agreeable to therapy with no c/o pain. Pt requested to complete therapy in room d/t visitor present. Pt used 2 lb dowel and completed bimanual shoulder raises, bicep curls, and chest press to increase B UE strength needed during ADL transfers. Pt completed dynamic reaching task with R UE, picking up small chips and completing in hand manipulation, and then inserting chips into a moving target. Pt c/o fatigue throughout session and requiring frequent rest breaks. Pt left sitting up in w/c with guest present.   Therapy Documentation Precautions:  Precautions Precautions: Fall Precaution Comments: R lateral lean Restrictions Weight Bearing Restrictions: No Other Position/Activity Restrictions: WBAT on L foot   Vital Signs: Therapy Vitals Temp: 98.2 F (36.8  C) Temp Source: Oral Pulse Rate: 93 Resp: 20 BP: (!) 153/92 Patient Position (if appropriate): Sitting Oxygen Therapy SpO2: 100 % O2 Device: Room Air Pain:  No pain reported ADL: ADL ADL Comments: refer to functional navigator  See Function Navigator for Current Functional Status.   Therapy/Group: Individual Therapy  Curtis Sites 10/24/2017, 4:31 PM

## 2017-10-24 NOTE — Progress Notes (Signed)
Social Work [name], female   DOB: February 09, 1951, 67 y.o.   MRN: [id number]  Spoke with tara-daughter via telephone to discuss team conference and her lack of participation in therapies and low motivation. Discussed concern is getting up the stairs at home, pt has four steps and where she is going first when discharged is Tara's which has two steps. [name] is aware of Mom's attitude and feels like she is ready to be home and out of the hospital. Scheduled for family education Monday 9-12. Lucy-SW will be back to address questions and concerns on Monday.

## 2017-10-25 ENCOUNTER — Inpatient Hospital Stay (HOSPITAL_COMMUNITY): Payer: Medicare Other | Admitting: Physical Therapy

## 2017-10-25 ENCOUNTER — Inpatient Hospital Stay (HOSPITAL_COMMUNITY): Payer: Medicare Other | Admitting: Occupational Therapy

## 2017-10-25 ENCOUNTER — Inpatient Hospital Stay (HOSPITAL_COMMUNITY): Payer: Medicare Other | Admitting: Speech Pathology

## 2017-10-25 DIAGNOSIS — E785 Hyperlipidemia, unspecified: Secondary | ICD-10-CM | POA: Diagnosis not present

## 2017-10-25 DIAGNOSIS — I69354 Hemiplegia and hemiparesis following cerebral infarction affecting left non-dominant side: Secondary | ICD-10-CM | POA: Diagnosis not present

## 2017-10-25 DIAGNOSIS — K5901 Slow transit constipation: Secondary | ICD-10-CM | POA: Diagnosis not present

## 2017-10-25 DIAGNOSIS — F410 Panic disorder [episodic paroxysmal anxiety] without agoraphobia: Secondary | ICD-10-CM | POA: Diagnosis not present

## 2017-10-25 DIAGNOSIS — N179 Acute kidney failure, unspecified: Secondary | ICD-10-CM | POA: Diagnosis not present

## 2017-10-25 DIAGNOSIS — I6349 Cerebral infarction due to embolism of other cerebral artery: Secondary | ICD-10-CM | POA: Diagnosis not present

## 2017-10-25 DIAGNOSIS — E1169 Type 2 diabetes mellitus with other specified complication: Secondary | ICD-10-CM | POA: Diagnosis not present

## 2017-10-25 DIAGNOSIS — S92502S Displaced unspecified fracture of left lesser toe(s), sequela: Secondary | ICD-10-CM | POA: Diagnosis not present

## 2017-10-25 DIAGNOSIS — D62 Acute posthemorrhagic anemia: Secondary | ICD-10-CM | POA: Diagnosis not present

## 2017-10-25 DIAGNOSIS — G894 Chronic pain syndrome: Secondary | ICD-10-CM | POA: Diagnosis not present

## 2017-10-25 LAB — CBC
HEMATOCRIT: 36.1 % (ref 36.0–46.0)
Hemoglobin: 11.2 g/dL — ABNORMAL LOW (ref 12.0–15.0)
MCH: 29.1 pg (ref 26.0–34.0)
MCHC: 31 g/dL (ref 30.0–36.0)
MCV: 93.8 fL (ref 78.0–100.0)
Platelets: 254 10*3/uL (ref 150–400)
RBC: 3.85 MIL/uL — AB (ref 3.87–5.11)
RDW: 12.5 % (ref 11.5–15.5)
WBC: 6.8 10*3/uL (ref 4.0–10.5)

## 2017-10-25 LAB — BASIC METABOLIC PANEL
Anion gap: 11 (ref 5–15)
BUN: 33 mg/dL — ABNORMAL HIGH (ref 8–23)
CHLORIDE: 99 mmol/L (ref 98–111)
CO2: 28 mmol/L (ref 22–32)
Calcium: 8.7 mg/dL — ABNORMAL LOW (ref 8.9–10.3)
Creatinine, Ser: 1.38 mg/dL — ABNORMAL HIGH (ref 0.44–1.00)
GFR, EST AFRICAN AMERICAN: 44 mL/min — AB (ref >60)
GFR, EST NON AFRICAN AMERICAN: 38 mL/min — AB (ref >60)
Glucose, Bld: 183 mg/dL — ABNORMAL HIGH (ref 70–99)
POTASSIUM: 4.2 mmol/L (ref 3.5–5.1)
SODIUM: 138 mmol/L (ref 135–145)

## 2017-10-25 LAB — GLUCOSE, CAPILLARY
GLUCOSE-CAPILLARY: 168 mg/dL — AB (ref 70–99)
GLUCOSE-CAPILLARY: 322 mg/dL — AB (ref 70–99)
GLUCOSE-CAPILLARY: 243 mg/dL — AB (ref 70–99)
GLUCOSE-CAPILLARY: 154 mg/dL — AB (ref 70–99)
Glucose-Capillary: 164 mg/dL — ABNORMAL HIGH (ref 70–99)

## 2017-10-25 MED ORDER — ALPRAZOLAM 0.25 MG PO TABS
0.2500 mg | ORAL_TABLET | Freq: Three times a day (TID) | ORAL | Status: DC | PRN
  Administered 2017-10-25 – 2017-10-27 (×3): 0.25 mg via ORAL
  Filled 2017-10-25 (×3): qty 1

## 2017-10-25 MED ORDER — INSULIN GLARGINE 100 UNIT/ML ~~LOC~~ SOLN
22.0000 [IU] | Freq: Every day | SUBCUTANEOUS | Status: DC
  Administered 2017-10-26 – 2017-10-29 (×4): 22 [IU] via SUBCUTANEOUS
  Filled 2017-10-25 (×4): qty 0.22

## 2017-10-25 NOTE — Progress Notes (Signed)
Physical Therapy Session Note  Patient Details  Name: Chelsea [name] MRN: [id number] Date of Birth: 04-Dec-1950  Today's Date: 10/25/2017 PT Individual Time: 1130-1157 PT Individual Time Calculation (min): 27 min   Short Term Goals: Week 3:  PT Short Term Goal 1 (Week 3): = LTG  Skilled Therapeutic Interventions/Progress Updates:    Patient received up in chair, hesitant to participate in therapy session today as she reports she did not sleep well at all last night and is very fatigued; she declines leaving her room due to fatigue and expecting her daughter soon but agreeable to in room session. Performed sit to stand with ModA, able to stand for 1 minute while working on PVC pipe building, then patient briskly sat back down and states "I'm too tired, I can't stand anymore to do this". Focused session on problem solving and cognitive rehab in sitting today with modA/mod cues  to complete 2 PVC pipe puzzles. She was left sitting up in chair with all needs met, alarm activated this morning.   Therapy Documentation Precautions:  Precautions Precautions: Fall Precaution Comments: R lateral lean Restrictions Weight Bearing Restrictions: No Other Position/Activity Restrictions: WBAT on L foot General:   Vital Signs:  Pain: Pain Assessment Pain Scale: 0-10 Pain Score: 0-No pain Faces Pain Scale: No hurt   See Function Navigator for Current Functional Status.   Therapy/Group: Individual Therapy   Deniece Ree PT, DPT, CBIS  Supplemental Physical Therapist [facility]    Pager [phone] Acute Rehab Office [phone]    10/25/2017, 12:32 PM

## 2017-10-25 NOTE — Plan of Care (Signed)
  Problem: Consults Goal: RH STROKE PATIENT EDUCATION Description See Patient Education module for education specifics  Outcome: Progressing   Problem: RH BOWEL ELIMINATION Goal: RH STG MANAGE BOWEL WITH ASSISTANCE Description STG Manage Bowel with min Assistance.  Outcome: Progressing Goal: RH STG MANAGE BOWEL W/MEDICATION W/ASSISTANCE Description STG Manage Bowel with Medication with min Assistance.  Outcome: Progressing   Problem: RH BLADDER ELIMINATION Goal: RH STG MANAGE BLADDER WITH ASSISTANCE Description STG Manage Bladder With min Assistance  Outcome: Progressing   Problem: RH SKIN INTEGRITY Goal: RH STG SKIN FREE OF INFECTION/BREAKDOWN Description min  Outcome: Progressing   Problem: RH SAFETY Goal: RH STG ADHERE TO SAFETY PRECAUTIONS W/ASSISTANCE/DEVICE Description STG Adhere to Safety Precautions With min Assistance/Device.  Outcome: Progressing   Problem: RH PAIN MANAGEMENT Goal: RH STG PAIN MANAGED AT OR BELOW PT'S PAIN GOAL Description 3 or less  Outcome: Progressing   Problem: RH KNOWLEDGE DEFICIT Goal: RH STG INCREASE KNOWLEDGE OF DIABETES Outcome: Progressing Goal: RH STG INCREASE KNOWLEDGE OF HYPERTENSION Outcome: Progressing Goal: RH STG INCREASE KNOWLEGDE OF HYPERLIPIDEMIA Outcome: Progressing Goal: RH STG INCREASE KNOWLEDGE OF STROKE PROPHYLAXIS Outcome: Progressing

## 2017-10-25 NOTE — Progress Notes (Signed)
Physical Therapy Session Note  Patient Details  Name: Chelsea [name] MRN: [id number] Date of Birth: 13-Aug-1950  Today's Date: 10/25/2017 PT Individual Time: 1255-1333 PT Individual Time Calculation (min): 38 min   Short Term Goals: Week 3:  PT Short Term Goal 1 (Week 3): = LTG  Skilled Therapeutic Interventions/Progress Updates:    pt performs w/c mobility in controlled environment x 25' with supervision with bilat UEs.  W/c mobility in home environment requires min A for steering with bilat UEs.  Gait training 10' x 2, 25' with mod A due to Rt lean, manual facilitation for wt shifts and trunk control, min A for steering RW.  Standing mini squats at railing with focus on return to midline posture and reducing Rt lean with min manual facilitation.  Pt continues to be limited by decreased endurance requiring increased rest breaks during session.  Therapy Documentation Precautions:  Precautions Precautions: Fall Precaution Comments: R lateral lean Restrictions Weight Bearing Restrictions: No Other Position/Activity Restrictions: WBAT on L foot  Pain: Pain Assessment Pain Scale: 0-10 Pain Score: 0-No pain Faces Pain Scale: No hurt    Therapy/Group: Individual Therapy  Graycie Halley 10/25/2017, 1:33 PM

## 2017-10-25 NOTE — Progress Notes (Signed)
[facility]IC[facility] RE[facility]ABILITATION     PROGRESS NOTE  Subjective: Still a little upset and anxious over her daughter's medical issues. Had a hard time sleeping last night as a result. Admits to being anxious and that she has a "family history" of anxiety  ROS: Patient denies fever, rash, sore throat, blurred vision, nausea, vomiting, diarrhea, cough, shortness of breath or chest pain, joint or back pain, headache, or mood change.   Objective:  No results found. Recent Labs    10/25/17 0640  WBC 6.8  HGB 11.2*  HCT 36.1  PLT 254   Recent Labs    10/25/17 0640  NA 138  K 4.2  CL 99  GLUCOSE 183*  BUN 33*  CREATININE 1.38*  CALCIUM 8.7*   CBG (last 3)  Recent Labs    10/24/17 2141 10/25/17 0014 10/25/17 0634  GLUCAP 183* 164* 168*    Wt Readings from Last 3 Encounters:  10/23/17 105.3 kg  10/06/17 108.9 kg  10/06/17 109.3 kg     Intake/Output Summary (Last 24 hours) at 10/25/2017 0927 Last data filed at 10/25/2017 0900 Gross per 24 hour  Intake 720 ml  Output 600 ml  Net 120 ml    Vital Signs: Blood pressure (!) 154/61, pulse 97, temperature 97.8 F (36.6 C), resp. rate 18, height 5\' 6"  (1.676 m), weight 105.3 kg, SpO2 100 %. Physical Exam:  Constitutional: No distress . Vital signs reviewed. HEENT: EOMI, oral membranes moist Neck: supple Cardiovascular: RRR without murmur. No JVD    Respiratory: CTA Bilaterally without wheezes or rales. Normal effort    GI: BS +, non-tender, non-distended  Musc: No edema or tenderness in extremities.  Left foot with less pain. Neurological: She isalertand oriented Improving dysarthria Motor: RUE 5/5.  LUE: 4-4+/5 prox to distal, stable exam  Bilateral LE: 4-/5 HF,KE and 4/5 ADF/PF,stable Skin: Skin iswarmand dry.  Psychiatric: pleasant, sl anxious  Assessment/Plan: 1. Functional deficits secondary to bilateral embolic cerebral infarcts which require 3+ hours per day of interdisciplinary therapy  in a comprehensive inpatient rehab setting. Physiatrist is providing close team supervision and 24 hour management of active medical problems listed below. Physiatrist and rehab team continue to assess barriers to discharge/monitor patient progress toward functional and medical goals.  Function:  Bathing Bathing position   Position: Shower  Bathing parts Body parts bathed by patient: Right arm, Left arm, Chest, Abdomen, Front perineal area, Right upper leg, Left upper leg, Right lower leg, Left lower leg, Buttocks, Back Body parts bathed by helper: Back  Bathing assist Assist Level: Supervision or verbal cues Assistive Device Comment: LH sponge    Upper Body Dressing/Undressing Upper body dressing   What is the patient wearing?: Pull over shirt/dress     Pull over shirt/dress - Perfomed by patient: Thread/unthread right sleeve, Thread/unthread left sleeve, Put head through opening, Pull shirt over trunk          Upper body assist Assist Level: Set up   Set up : To obtain clothing/put away  Lower Body Dressing/Undressing Lower body dressing   What is the patient wearing?: Underwear Underwear - Performed by patient: Thread/unthread right underwear leg, Thread/unthread left underwear leg, Pull underwear up/down Underwear - Performed by helper: Pull underwear up/down       Non-skid slipper socks- Performed by helper: Don/doff right sock, Don/doff left sock                  Lower body assist Assist for lower  body dressing: Supervision or verbal cues      Toileting Toileting   Toileting steps completed by patient: Adjust clothing prior to toileting, Performs perineal hygiene, Adjust clothing after toileting Toileting steps completed by helper: Adjust clothing after toileting Toileting Assistive Devices: Grab bar or rail  Toileting assist Assist level: Supervision or verbal cues   Transfers Chair/bed transfer   Chair/bed transfer method: Stand pivot Chair/bed  transfer assist level: Moderate assist (Pt 50 - 74%/lift or lower) Chair/bed transfer assistive device: Armrests, Medical sales representative Ambulation activity did not occur: Safety/medical concerns(dizziness upon standing)   Max distance: 6 Assist level: Maximal assist (Pt 25 - 49%)(+ a person following with w/c)   Wheelchair   Type: Manual Max wheelchair distance: 50 Assist Level: Touching or steadying assistance (Pt > 75%)  Cognition Comprehension Comprehension assist level: Understands basic 90% of the time/cues < 10% of the time  Expression Expression assist level: Expresses basic 90% of the time/requires cueing < 10% of the time.  Social Interaction Social Interaction assist level: Interacts appropriately 75 - 89% of the time - Needs redirection for appropriate language or to initiate interaction.  Problem Solving Problem solving assist level: Solves basic 50 - 74% of the time/requires cueing 25 - 49% of the time  Memory Memory assist level: Recognizes or recalls 50 - 74% of the time/requires cueing 25 - 49% of the time   Medical Problem List and Plan: 1.Functional deficitssecondary tobilateral embolic stroke. Continue CIR 2. DVT Prophylaxis/Anticoagulation: Pharmaceutical:Lovenox 3. Pain Management:Voltaren gel to left foot. -left avulsion fx prox 5th phalanx - darco shoe/ WBAT LLE  4. Mood:LCSW to follow for evaluation and support. 5. Neuropsych: This patientiscapable of making decisions on herown behalf. 6. Skin/Wound Care:routine pressure relief measures. 7. Fluids/Electrolytes/Nutrition:Monitor I/O. -push PO 8. T2DMwith retinopathy: Hgb A1C-7.6.  -sub-optimal control  Avoid metformin due to elevated SCr.    increase  lantus to 24u beginning 9/14 9. HTN: Montor BP bid  On Losartan and Verapamilto beresumed  Hydralazine 10 3 times a day started on 9/2, increased to 25 on 9/3,  Orthostatic  hypotension    -VS do not suggest orthostasis at this time   -continueTEDS/Abdominal binder ordered, encouraging fluid intake per below   -Continue to hold clonidine.   -I suspect anxiety is playing a role. Pt agrees    -begin low dose prn xanax 9/13 10. Dyslipidemia: Continuelipitor.  12. CKD: Baseline SCr 1.3 to 1.5 range in the past 6 months.  Creatinine 1.28 on 9/4, 1.38 9/13, but BUN elevated  -encourage fluids  -check labs again Saturday 13. Dyspnea- new onset:   -PT addressing activity tolerance, improving 14.  Nausea: Improved with bowel movements  15. Leukocytosis: Resolved  WBCs 6.8 again 9/13  Afebrile    16. ABLA  Hb 11.2 on 9/13      LOS (Days) [place] EVALUATION WAS PERFORMED  Meredith Staggers, MD 10/25/2017 9:27 AM

## 2017-10-25 NOTE — Progress Notes (Signed)
Occupational Therapy Session Note  Patient Details  Name: Chelsea [name] MRN: [id number] Date of Birth: Jul 20, 1950  Today's Date: 10/25/2017 OT Individual Time: 1000-1058 OT Individual Time Calculation (min): 58 min    Short Term Goals: Week 2:  OT Short Term Goal 1 (Week 2): Pt will don underwear with (S) OT Short Term Goal 1 - Progress (Week 2): Not met OT Short Term Goal 2 (Week 2): Pt will require no more than 1 vc for safety awareness during standing level clothing management OT Short Term Goal 2 - Progress (Week 2): Met OT Short Term Goal 3 (Week 2): Pt will complete toilet transfer with (S) OT Short Term Goal 3 - Progress (Week 2): Not met  Skilled Therapeutic Interventions/Progress Updates:    Pt presents sitting up in recliner, brother present, agreeable to OT tx session. No c/o pain during this session. Pt requiring modA for donning L boot, minguard assist for donning R shoe, pt firmly declining use of long handled shoe horn for task completion. Transported pt totalA via w/c to therapy gym. Pt requiring modA for stand pivot transfer using RW, transferring to the L. Seated edge of mat pt participated in peg board activity with focus on RUE [facility] and maintaining upright posture. Pt requiring mod cues overall to select colors and accurately complete image, cues for use of RUE as pt instinctively attempting to use LUE. Pt requiring x2 puzzles given increased time to perform. Pt completed stand/squat pivot transfer to w/c to the R with minA. Transported pt back to room in manner described above, requesting need to toilet. Pt requiring minA for stand pivot w/c>BSC over toilet, modA for transfer back to w/c with minA for standing balance during clothing management, assist for peri-care. Pt left seated in w/c end of session with chair alarm set, call bell and needs within reach.   Therapy Documentation Precautions:  Precautions Precautions: Fall Precaution Comments: R lateral  lean Restrictions Weight Bearing Restrictions: No Other Position/Activity Restrictions: WBAT on L foot    ADL: ADL ADL Comments: refer to functional navigator  See Function Navigator for Current Functional Status.   Therapy/Group: Individual Therapy  Raymondo Band 10/25/2017, 10:34 AM

## 2017-10-25 NOTE — Progress Notes (Signed)
Speech Language Pathology Daily Session Note  Patient Details  Name: Chelsea [name] MRN: [id number] Date of Birth: 02/01/1951  Today's Date: 10/25/2017 SLP Individual Time: 0730-0825 SLP Individual Time Calculation (min): 55 min  Short Term Goals: Week 3: SLP Short Term Goal 1 (Week 3): Patient will demonstrate functional problem solving for mildly complex but familiar tasks with Supervision verbal cues.  SLP Short Term Goal 2 (Week 3): Patient will recall new, daily infomration with Mod I verbal cues for use of compensatory strategies.  SLP Short Term Goal 3 (Week 3): Patient will self-monitor and correct errors during functional tasks with Mod I.  SLP Short Term Goal 4 (Week 3): Patient will consume current diet with minimal overt s/s of aspiration with Mod I.   Skilled Therapeutic Interventions: Skilled treatment session focused on dysphagia and cognitive goals. Upon arrival, patient was sitting EOB and coughing with breakfast meal of regular textures. Patient demonstrated difficulty with hard, crunchy bacon due to being edentulous leading to large coughing episodes. However, despite Max encouragement and education, patient demonstrated decreased safety awareness and continued to consume bacon despite consistent overt s/s of aspiration and education in regards to risk of choking/aspiration.  Due to consistent overt s/s of aspiration with decreased safety awareness, patient downgraded to Dys. 3 textures to maximize overall safety with meals. Goals revised to reflect session. Patient was frustrated with clinician but verbalized understanding. Throughout session, patient perseverative on worrying about the current health and whereabouts of daughter which made her "nervous" requiring Mod A verbal cues for problem solving with phone use. Patient eventually able to talk to her daughter and agreeable to get out of bed. Patient transferred to the wheelchair with the RW with supervision verbal cues for  safety. Patient left upright in wheelchair with all needs within reach and alarm on. Continue with current plan of care.      Function:  Eating Eating   Modified Consistency Diet: No Eating Assist Level: Supervision or verbal cues           Cognition Comprehension Comprehension assist level: Understands basic 90% of the time/cues < 10% of the time  Expression   Expression assist level: Expresses basic 90% of the time/requires cueing < 10% of the time.  Social Interaction Social Interaction assist level: Interacts appropriately 75 - 89% of the time - Needs redirection for appropriate language or to initiate interaction.  Problem Solving Problem solving assist level: Solves basic 50 - 74% of the time/requires cueing 25 - 49% of the time  Memory Memory assist level: Recognizes or recalls 50 - 74% of the time/requires cueing 25 - 49% of the time    Pain Pain Assessment Pain Scale: 0-10 Pain Score: 0-No pain Faces Pain Scale: No hurt  Therapy/Group: Individual Therapy  Greydis Stlouis, [place] 10/25/2017, 3:21 PM

## 2017-10-26 ENCOUNTER — Inpatient Hospital Stay (HOSPITAL_COMMUNITY): Payer: Medicare Other | Admitting: Occupational Therapy

## 2017-10-26 DIAGNOSIS — N183 Chronic kidney disease, stage 3 unspecified: Secondary | ICD-10-CM

## 2017-10-26 DIAGNOSIS — E669 Obesity, unspecified: Secondary | ICD-10-CM | POA: Diagnosis not present

## 2017-10-26 DIAGNOSIS — F410 Panic disorder [episodic paroxysmal anxiety] without agoraphobia: Secondary | ICD-10-CM | POA: Diagnosis not present

## 2017-10-26 DIAGNOSIS — N179 Acute kidney failure, unspecified: Secondary | ICD-10-CM | POA: Diagnosis not present

## 2017-10-26 DIAGNOSIS — E785 Hyperlipidemia, unspecified: Secondary | ICD-10-CM | POA: Diagnosis not present

## 2017-10-26 DIAGNOSIS — I10 Essential (primary) hypertension: Secondary | ICD-10-CM | POA: Diagnosis not present

## 2017-10-26 DIAGNOSIS — R7309 Other abnormal glucose: Secondary | ICD-10-CM

## 2017-10-26 DIAGNOSIS — I69354 Hemiplegia and hemiparesis following cerebral infarction affecting left non-dominant side: Secondary | ICD-10-CM | POA: Diagnosis not present

## 2017-10-26 DIAGNOSIS — I6349 Cerebral infarction due to embolism of other cerebral artery: Secondary | ICD-10-CM | POA: Diagnosis not present

## 2017-10-26 DIAGNOSIS — R0989 Other specified symptoms and signs involving the circulatory and respiratory systems: Secondary | ICD-10-CM | POA: Diagnosis not present

## 2017-10-26 DIAGNOSIS — G894 Chronic pain syndrome: Secondary | ICD-10-CM | POA: Diagnosis not present

## 2017-10-26 DIAGNOSIS — E1169 Type 2 diabetes mellitus with other specified complication: Secondary | ICD-10-CM | POA: Diagnosis not present

## 2017-10-26 DIAGNOSIS — D62 Acute posthemorrhagic anemia: Secondary | ICD-10-CM

## 2017-10-26 LAB — GLUCOSE, CAPILLARY
GLUCOSE-CAPILLARY: 241 mg/dL — AB (ref 70–99)
Glucose-Capillary: 142 mg/dL — ABNORMAL HIGH (ref 70–99)
Glucose-Capillary: 223 mg/dL — ABNORMAL HIGH (ref 70–99)
Glucose-Capillary: 194 mg/dL — ABNORMAL HIGH (ref 70–99)

## 2017-10-26 LAB — BASIC METABOLIC PANEL
Anion gap: 11 (ref 5–15)
BUN: 35 mg/dL — AB (ref 8–23)
CHLORIDE: 101 mmol/L (ref 98–111)
CO2: 25 mmol/L (ref 22–32)
Calcium: 8.6 mg/dL — ABNORMAL LOW (ref 8.9–10.3)
Creatinine, Ser: 1.53 mg/dL — ABNORMAL HIGH (ref 0.44–1.00)
GFR calc Af Amer: 39 mL/min — ABNORMAL LOW (ref >60)
GFR calc non Af Amer: 34 mL/min — ABNORMAL LOW (ref >60)
Glucose, Bld: 153 mg/dL — ABNORMAL HIGH (ref 70–99)
POTASSIUM: 4.4 mmol/L (ref 3.5–5.1)
Sodium: 137 mmol/L (ref 135–145)

## 2017-10-26 NOTE — Plan of Care (Signed)
  Problem: Consults Goal: RH STROKE PATIENT EDUCATION Description See Patient Education module for education specifics  Outcome: Progressing   Problem: RH BOWEL ELIMINATION Goal: RH STG MANAGE BOWEL WITH ASSISTANCE Description STG Manage Bowel with min Assistance.  Outcome: Progressing Goal: RH STG MANAGE BOWEL W/MEDICATION W/ASSISTANCE Description STG Manage Bowel with Medication with min Assistance.  Outcome: Progressing   Problem: RH BLADDER ELIMINATION Goal: RH STG MANAGE BLADDER WITH ASSISTANCE Description STG Manage Bladder With min Assistance  Outcome: Progressing   Problem: RH SKIN INTEGRITY Goal: RH STG SKIN FREE OF INFECTION/BREAKDOWN Description min  Outcome: Progressing   Problem: RH SAFETY Goal: RH STG ADHERE TO SAFETY PRECAUTIONS W/ASSISTANCE/DEVICE Description STG Adhere to Safety Precautions With min Assistance/Device.  Outcome: Progressing   Problem: RH PAIN MANAGEMENT Goal: RH STG PAIN MANAGED AT OR BELOW PT'S PAIN GOAL Description 3 or less  Outcome: Progressing   Problem: RH KNOWLEDGE DEFICIT Goal: RH STG INCREASE KNOWLEDGE OF DIABETES Outcome: Progressing Goal: RH STG INCREASE KNOWLEDGE OF HYPERTENSION Outcome: Progressing Goal: RH STG INCREASE KNOWLEGDE OF HYPERLIPIDEMIA Outcome: Progressing Goal: RH STG INCREASE KNOWLEDGE OF STROKE PROPHYLAXIS Outcome: Progressing

## 2017-10-26 NOTE — Progress Notes (Signed)
Occupational Therapy Session Note  Patient Details  Name: Chelsea [name] MRN: [id number] Date of Birth: 05-Oct-1950  Today's Date: 10/26/2017 OT Individual Time: 1430-1515 OT Individual Time Calculation (min): 45 min  30 minutes missed  Skilled Therapeutic Interventions/Progress Updates:    Pt greeted supine in bed. Reporting that she didn't feel well and was a little dizzy. Initially refusing tx however agreeable to bedside therapies with encouragement. When EOB, engaged pt in card game to work on UE NMR, sitting balance, and cognition. Pt required min-mod vcs for following discussed instructions and ST memory recall. She often dropped cards due to weak hand strength bilaterally. Mod vcs for using R UE at dominant level to increase challenge. Pt then reported feeling too fatigued to participate further. Continued to decline participation in self care tasks or therapeutic activity. Pt left in bed with all needs within reach and bed alarm set. 30 minutes missed.   Therapy Documentation Precautions:  Precautions Precautions: Fall Precaution Comments: R lateral lean Restrictions Weight Bearing Restrictions: No Other Position/Activity Restrictions: WBAT on L foot Vital Signs: Therapy Vitals Temp: 98.5 F (36.9 C) Pulse Rate: 88 Resp: 20 BP: (!) 145/68 Patient Position (if appropriate): Lying Oxygen Therapy SpO2: 100 % O2 Device: Room Air Pain: No c/o pain during session    ADL: ADL ADL Comments: refer to functional navigator     See Function Navigator for Current Functional Status.   Therapy/Group: Individual Therapy  Kennith Morss A Jaythen Hamme 10/26/2017, 3:54 PM

## 2017-10-26 NOTE — Progress Notes (Signed)
[place] PHYSICAL MEDICINE & REHABILITATION     PROGRESS NOTE  Subjective: Patient seen lying in bed this morning. She states she slept well overnight with the addition of sleep medications.  ROS: Denies CP, SOB, N/V/D  Objective:  No results found. Recent Labs    10/25/17 0640  WBC 6.8  HGB 11.2*  HCT 36.1  PLT 254   Recent Labs    10/25/17 0640  NA 138  K 4.2  CL 99  GLUCOSE 183*  BUN 33*  CREATININE 1.38*  CALCIUM 8.7*   CBG (last 3)  Recent Labs    10/25/17 1713 10/25/17 2134 10/26/17 0643  GLUCAP 243* 154* 142*    Wt Readings from Last 3 Encounters:  10/23/17 105.3 kg  10/06/17 108.9 kg  10/06/17 109.3 kg     Intake/Output Summary (Last 24 hours) at 10/26/2017 0954 Last data filed at 10/25/2017 1700 Gross per 24 hour  Intake 480 ml  Output -  Net 480 ml    Vital Signs: Blood pressure 139/69, pulse (!) 104, temperature 97.9 F (36.6 C), temperature source Oral, resp. rate 18, height 5\' 6"  (1.676 m), weight 105.3 kg, SpO2 100 %. Physical Exam:  Constitutional: No distress . Vital signs reviewed. HENT: Normocephalic.  Atraumatic. Eyes: EOMI. No discharge. Cardiovascular: RRR. No JVD. Respiratory: CTA Bilaterally. Normal effort. GI: BS +. Non-distended. Musc: No edema or tenderness in extremities. Neurological: She isalertand oriented dysarthria Motor: RUE 5/5.  LUE: 4+/5 prox to distal Skin: Skin iswarmand dry.  Psychiatric: pleasant, sl anxious  Assessment/Plan: 1. Functional deficits secondary to bilateral embolic cerebral infarcts which require 3+ hours per day of interdisciplinary therapy in a comprehensive inpatient rehab setting. Physiatrist is providing close team supervision and 24 hour management of active medical problems listed below. Physiatrist and rehab team continue to assess barriers to discharge/monitor patient progress toward functional and medical goals.  Function:  Bathing Bathing position   Position: Shower   Bathing parts Body parts bathed by patient: Right arm, Left arm, Chest, Abdomen, Front perineal area, Right upper leg, Left upper leg, Right lower leg, Left lower leg, Buttocks, Back Body parts bathed by helper: Back  Bathing assist Assist Level: Supervision or verbal cues Assistive Device Comment: LH sponge    Upper Body Dressing/Undressing Upper body dressing   What is the patient wearing?: Pull over shirt/dress     Pull over shirt/dress - Perfomed by patient: Thread/unthread right sleeve, Thread/unthread left sleeve, Put head through opening, Pull shirt over trunk          Upper body assist Assist Level: Set up   Set up : To obtain clothing/put away  Lower Body Dressing/Undressing Lower body dressing   What is the patient wearing?: Underwear Underwear - Performed by patient: Thread/unthread right underwear leg, Thread/unthread left underwear leg, Pull underwear up/down Underwear - Performed by helper: Pull underwear up/down       Non-skid slipper socks- Performed by helper: Don/doff right sock, Don/doff left sock                  Lower body assist Assist for lower body dressing: Supervision or verbal cues      Toileting Toileting   Toileting steps completed by patient: Adjust clothing prior to toileting, Performs perineal hygiene, Adjust clothing after toileting Toileting steps completed by helper: Adjust clothing after toileting Toileting Assistive Devices: Grab bar or rail  Toileting assist Assist level: Supervision or verbal cues   Transfers Chair/bed transfer   Chair/bed transfer  method: Stand pivot Chair/bed transfer assist level: Moderate assist (Pt 50 - 74%/lift or lower) Chair/bed transfer assistive device: Armrests, Medical sales representative Ambulation activity did not occur: Safety/medical concerns(dizziness upon standing)   Max distance: 6 Assist level: Maximal assist (Pt 25 - 49%)(+ a person following with w/c)   Wheelchair   Type:  Manual Max wheelchair distance: 50 Assist Level: Touching or steadying assistance (Pt > 75%)  Cognition Comprehension Comprehension assist level: Understands basic 90% of the time/cues < 10% of the time  Expression Expression assist level: Expresses basic 90% of the time/requires cueing < 10% of the time.  Social Interaction Social Interaction assist level: Interacts appropriately 75 - 89% of the time - Needs redirection for appropriate language or to initiate interaction.  Problem Solving Problem solving assist level: Solves basic 50 - 74% of the time/requires cueing 25 - 49% of the time  Memory Memory assist level: Recognizes or recalls 50 - 74% of the time/requires cueing 25 - 49% of the time   Medical Problem List and Plan: 1.Functional deficitssecondary tobilateral embolic stroke. Continue CIR 2. DVT Prophylaxis/Anticoagulation: Pharmaceutical:Lovenox 3. Pain Management:Voltaren gel to left foot. -left avulsion fx prox 5th phalanx - darco shoe/ WBAT LLE  4. Mood:LCSW to follow for evaluation and support. 5. Neuropsych: This patientiscapable of making decisions on herown behalf. 6. Skin/Wound Care:routine pressure relief measures. 7. Fluids/Electrolytes/Nutrition:Monitor I/O. -push PO 8. T2DMwith retinopathy: Hgb A1C-7.6.  Avoid metformin due to elevated SCr.    increase  lantus to 24u on 9/14  Labile on 9/14 9. HTN: Montor BP bid  On Losartan and Verapamilto beresumed  Hydralazine 10 3 times a day started on 9/2, increased to 25 on 9/3,  Orthostatic hypotension    -VS do not suggest orthostasis at this time   -continueTEDS/Abdominal binder ordered, encouraging fluid intake per below   -Continue to hold clonidine.   -? Anxiety    -begin low dose prn xanax 9/13  Labile on 9/14 10. Dyslipidemia: Continuelipitor.  12. CKD: Baseline SCr 1.3 to 1.5 range in the past 6 months.  Creatinine 1.38 on 9/13  -encourage  fluids  Labs ordered for Monday 13. Dyspnea- new onset:   -PT addressing activity tolerance, improving 14.  Nausea: Improved with bowel movements  15. Leukocytosis: Resolved  WBCs 6.8 on 9/13  Afebrile 16. ABLA  Hb 11.2 on 9/13      LOS (Days) 17 A FACE TO FACE EVALUATION WAS PERFORMED  Chelsea Oplinger Lorie Phenix, MD 10/26/2017 9:54 AM

## 2017-10-27 DIAGNOSIS — I10 Essential (primary) hypertension: Secondary | ICD-10-CM | POA: Diagnosis not present

## 2017-10-27 DIAGNOSIS — N179 Acute kidney failure, unspecified: Secondary | ICD-10-CM | POA: Diagnosis not present

## 2017-10-27 DIAGNOSIS — E785 Hyperlipidemia, unspecified: Secondary | ICD-10-CM | POA: Diagnosis not present

## 2017-10-27 DIAGNOSIS — G894 Chronic pain syndrome: Secondary | ICD-10-CM | POA: Diagnosis not present

## 2017-10-27 DIAGNOSIS — R7989 Other specified abnormal findings of blood chemistry: Secondary | ICD-10-CM | POA: Diagnosis not present

## 2017-10-27 DIAGNOSIS — R0989 Other specified symptoms and signs involving the circulatory and respiratory systems: Secondary | ICD-10-CM | POA: Diagnosis not present

## 2017-10-27 DIAGNOSIS — F410 Panic disorder [episodic paroxysmal anxiety] without agoraphobia: Secondary | ICD-10-CM | POA: Diagnosis not present

## 2017-10-27 DIAGNOSIS — N183 Chronic kidney disease, stage 3 (moderate): Secondary | ICD-10-CM | POA: Diagnosis not present

## 2017-10-27 DIAGNOSIS — D62 Acute posthemorrhagic anemia: Secondary | ICD-10-CM | POA: Diagnosis not present

## 2017-10-27 DIAGNOSIS — I6349 Cerebral infarction due to embolism of other cerebral artery: Secondary | ICD-10-CM | POA: Diagnosis not present

## 2017-10-27 DIAGNOSIS — R7309 Other abnormal glucose: Secondary | ICD-10-CM | POA: Diagnosis not present

## 2017-10-27 DIAGNOSIS — I69354 Hemiplegia and hemiparesis following cerebral infarction affecting left non-dominant side: Secondary | ICD-10-CM | POA: Diagnosis not present

## 2017-10-27 DIAGNOSIS — E669 Obesity, unspecified: Secondary | ICD-10-CM | POA: Diagnosis not present

## 2017-10-27 DIAGNOSIS — E1169 Type 2 diabetes mellitus with other specified complication: Secondary | ICD-10-CM | POA: Diagnosis not present

## 2017-10-27 LAB — GLUCOSE, CAPILLARY
GLUCOSE-CAPILLARY: 132 mg/dL — AB (ref 70–99)
GLUCOSE-CAPILLARY: 223 mg/dL — AB (ref 70–99)
Glucose-Capillary: 185 mg/dL — ABNORMAL HIGH (ref 70–99)
Glucose-Capillary: 219 mg/dL — ABNORMAL HIGH (ref 70–99)

## 2017-10-27 MED ORDER — SODIUM CHLORIDE 0.9 % IV SOLN: INTRAVENOUS | Status: DC

## 2017-10-27 NOTE — Progress Notes (Signed)
[facility]     PROGRESS NOTE  Subjective: Patient seen lying in bed this morning. She states she slept well overnight. She denies complaints.  ROS: Denies CP, SOB, N/V/D  Objective:  No results found. Recent Labs    10/25/17 0640  WBC 6.8  HGB 11.2*  HCT 36.1  PLT 254   Recent Labs    10/25/17 0640 10/26/17 0731  NA 138 137  K 4.2 4.4  CL 99 101  GLUCOSE 183* 153*  BUN 33* 35*  CREATININE 1.38* 1.53*  CALCIUM 8.7* 8.6*   CBG (last 3)  Recent Labs    10/26/17 1632 10/26/17 2132 10/27/17 0651  GLUCAP 241* 194* 132*    Wt Readings from Last 3 Encounters:  10/23/17 105.3 kg  10/06/17 108.9 kg  10/06/17 109.3 kg     Intake/Output Summary (Last 24 hours) at 10/27/2017 0721 Last data filed at 10/26/2017 2300 Gross per 24 hour  Intake 535 ml  Output -  Net 535 ml    Vital Signs: Blood pressure (!) 162/63, pulse 86, temperature 97.9 F (36.6 C), temperature source Oral, resp. rate 17, height 5\' 6"  (1.676 m), weight 105.3 kg, SpO2 99 %. Physical Exam:  Constitutional: No distress . Vital signs reviewed. HENT: Normocephalic.  Atraumatic. Eyes: EOMI. No discharge. Cardiovascular: RRR. No JVD. Respiratory: CTA bilaterally. Normal effort. GI: BS +. Non-distended. Musc: No edema or tenderness in extremities. Neurological: She isalertand oriented dysarthria Motor: RUE 5/5.  LUE: 4+/5 prox to distal LLE: Hip flexion 4/5, knee extension 4/5, ankle dorsiflexion 4+/5 Skin: Skin iswarmand dry.  Psychiatric: normal mood. Normal affect.  Assessment/Plan: 1. Functional deficits secondary to bilateral embolic cerebral infarcts which require 3+ hours per day of interdisciplinary therapy in a comprehensive inpatient rehab setting. Physiatrist is providing close team supervision and 24 hour management of active medical problems listed below. Physiatrist and rehab team continue to assess barriers to discharge/monitor patient  progress toward functional and medical goals.  Function:  Bathing Bathing position   Position: Shower  Bathing parts Body parts bathed by patient: Right arm, Left arm, Chest, Abdomen, Front perineal area, Right upper leg, Left upper leg, Right lower leg, Left lower leg, Buttocks, Back Body parts bathed by helper: Back  Bathing assist Assist Level: Supervision or verbal cues Assistive Device Comment: LH sponge    Upper Body Dressing/Undressing Upper body dressing   What is the patient wearing?: Pull over shirt/dress     Pull over shirt/dress - Perfomed by patient: Thread/unthread right sleeve, Thread/unthread left sleeve, Put head through opening, Pull shirt over trunk          Upper body assist Assist Level: Set up   Set up : To obtain clothing/put away  Lower Body Dressing/Undressing Lower body dressing   What is the patient wearing?: Underwear Underwear - Performed by patient: Thread/unthread right underwear leg, Thread/unthread left underwear leg, Pull underwear up/down Underwear - Performed by helper: Pull underwear up/down       Non-skid slipper socks- Performed by helper: Don/doff right sock, Don/doff left sock                  Lower body assist Assist for lower body dressing: Supervision or verbal cues      Toileting Toileting   Toileting steps completed by patient: Adjust clothing prior to toileting, Performs perineal hygiene, Adjust clothing after toileting Toileting steps completed by helper: Adjust clothing after toileting Toileting Assistive Devices: Grab bar or rail  Toileting assist Assist level: Supervision or verbal cues   Transfers Chair/bed transfer   Chair/bed transfer method: Stand pivot Chair/bed transfer assist level: Moderate assist (Pt 50 - 74%/lift or lower) Chair/bed transfer assistive device: Armrests, Medical sales representative Ambulation activity did not occur: Safety/medical concerns(dizziness upon standing)   Max  distance: 6 Assist level: Maximal assist (Pt 25 - 49%)(+ a person following with w/c)   Wheelchair   Type: Manual Max wheelchair distance: 50 Assist Level: Touching or steadying assistance (Pt > 75%)  Cognition Comprehension Comprehension assist level: Understands basic 90% of the time/cues < 10% of the time  Expression Expression assist level: Expresses basic 90% of the time/requires cueing < 10% of the time.  Social Interaction Social Interaction assist level: Interacts appropriately 75 - 89% of the time - Needs redirection for appropriate language or to initiate interaction.  Problem Solving Problem solving assist level: Solves basic 50 - 74% of the time/requires cueing 25 - 49% of the time  Memory Memory assist level: Recognizes or recalls 50 - 74% of the time/requires cueing 25 - 49% of the time   Medical Problem List and Plan: 1.Functional deficitssecondary tobilateral embolic stroke. Continue CIR 2. DVT Prophylaxis/Anticoagulation: Pharmaceutical:Lovenox 3. Pain Management:Voltaren gel to left foot. -left avulsion fx prox 5th phalanx - darco shoe/ WBAT LLE  4. Mood:LCSW to follow for evaluation and support. 5. Neuropsych: This patientiscapable of making decisions on herown behalf. 6. Skin/Wound Care:routine pressure relief measures. 7. Fluids/Electrolytes/Nutrition:Monitor I/O. -push PO 8. T2DMwith retinopathy: Hgb A1C-7.6.  Avoid metformin due to elevated SCr.    increase  lantus to 24u on 9/14  Labile on 9/15 9. HTN: Montor BP bid  On Losartan and Verapamilto beresumed  Hydralazine 10 3 times a day started on 9/2, increased to 25 on 9/3,  Orthostatic hypotension    -VS do not suggest orthostasis at this time   -continueTEDS/Abdominal binder ordered, encouraging fluid intake per below   -Continue to hold clonidine.   -? Anxiety    -begin low dose prn xanax 9/13  ? Trending up on 9/15, will consider further  medication adjustments if persistently elevated 10. Dyslipidemia: Continuelipitor.  12. CKD: Baseline SCr 1.3 to 1.5 range in the past 6 months.  Creatinine 1.53 on 9/14, remains within baseline range  -encourage fluids  Labs ordered for tomorrow  IVF daily at bedtime 3 days started on 9/15 13. Dyspnea- new onset:   -PT addressing activity tolerance, improving 14.  Nausea: Improved with bowel movements  15. Leukocytosis: Resolved  WBCs 6.8 on 9/13  Afebrile 16. ABLA  Hb 11.2 on 9/13      LOS (Days) 18 A FACE TO FACE EVALUATION WAS PERFORMED  Ankit Lorie Phenix, MD 10/27/2017 7:21 AM

## 2017-10-27 NOTE — Plan of Care (Signed)
  Problem: Consults Goal: RH STROKE PATIENT EDUCATION Description See Patient Education module for education specifics  Outcome: Progressing   Problem: RH BOWEL ELIMINATION Goal: RH STG MANAGE BOWEL WITH ASSISTANCE Description STG Manage Bowel with min Assistance.  Outcome: Progressing Goal: RH STG MANAGE BOWEL W/MEDICATION W/ASSISTANCE Description STG Manage Bowel with Medication with min Assistance.  Outcome: Progressing   Problem: RH BLADDER ELIMINATION Goal: RH STG MANAGE BLADDER WITH ASSISTANCE Description STG Manage Bladder With min Assistance  Outcome: Progressing   Problem: RH SKIN INTEGRITY Goal: RH STG SKIN FREE OF INFECTION/BREAKDOWN Description min  Outcome: Progressing   Problem: RH SAFETY Goal: RH STG ADHERE TO SAFETY PRECAUTIONS W/ASSISTANCE/DEVICE Description STG Adhere to Safety Precautions With min Assistance/Device.  Outcome: Progressing   Problem: RH PAIN MANAGEMENT Goal: RH STG PAIN MANAGED AT OR BELOW PT'S PAIN GOAL Description 3 or less  Outcome: Progressing   Problem: RH KNOWLEDGE DEFICIT Goal: RH STG INCREASE KNOWLEDGE OF DIABETES Outcome: Progressing Goal: RH STG INCREASE KNOWLEDGE OF HYPERTENSION Outcome: Progressing Goal: RH STG INCREASE KNOWLEGDE OF HYPERLIPIDEMIA Outcome: Progressing Goal: RH STG INCREASE KNOWLEDGE OF STROKE PROPHYLAXIS Outcome: Progressing

## 2017-10-27 NOTE — Progress Notes (Signed)
Sleepy atHS, but insistent on taking PRN xanax. Slept good last night. Incontinent of urine x 1. Chelsea [name] A

## 2017-10-27 NOTE — Progress Notes (Signed)
Two IV team nurses plus ultrasound attempted to start an IV. Attempt unsuccessful. Pt encouraged PO fluids. RN will continue to monitor.  Eliseo Squires, RN 10/27/2017 2118

## 2017-10-27 NOTE — Progress Notes (Signed)
Three IV team nurses plus ultrasound, attempted to start an IV. Unable to start. Pt encouraged PO fluids. Continue plan of care.  Chelsea [name] Chelsea [name]

## 2017-10-28 ENCOUNTER — Encounter (HOSPITAL_COMMUNITY): Payer: Medicare Other

## 2017-10-28 ENCOUNTER — Encounter (HOSPITAL_COMMUNITY): Payer: Medicare Other | Admitting: Speech Pathology

## 2017-10-28 ENCOUNTER — Inpatient Hospital Stay (HOSPITAL_COMMUNITY): Payer: Medicare Other

## 2017-10-28 ENCOUNTER — Ambulatory Visit (HOSPITAL_COMMUNITY): Payer: Medicare Other

## 2017-10-28 DIAGNOSIS — I69354 Hemiplegia and hemiparesis following cerebral infarction affecting left non-dominant side: Secondary | ICD-10-CM | POA: Diagnosis not present

## 2017-10-28 DIAGNOSIS — I6349 Cerebral infarction due to embolism of other cerebral artery: Secondary | ICD-10-CM | POA: Diagnosis not present

## 2017-10-28 DIAGNOSIS — G894 Chronic pain syndrome: Secondary | ICD-10-CM | POA: Diagnosis not present

## 2017-10-28 DIAGNOSIS — D62 Acute posthemorrhagic anemia: Secondary | ICD-10-CM | POA: Diagnosis not present

## 2017-10-28 DIAGNOSIS — F410 Panic disorder [episodic paroxysmal anxiety] without agoraphobia: Secondary | ICD-10-CM | POA: Diagnosis not present

## 2017-10-28 DIAGNOSIS — E785 Hyperlipidemia, unspecified: Secondary | ICD-10-CM | POA: Diagnosis not present

## 2017-10-28 DIAGNOSIS — R7989 Other specified abnormal findings of blood chemistry: Secondary | ICD-10-CM | POA: Diagnosis not present

## 2017-10-28 DIAGNOSIS — N179 Acute kidney failure, unspecified: Secondary | ICD-10-CM | POA: Diagnosis not present

## 2017-10-28 DIAGNOSIS — E1169 Type 2 diabetes mellitus with other specified complication: Secondary | ICD-10-CM | POA: Diagnosis not present

## 2017-10-28 DIAGNOSIS — S92502S Displaced unspecified fracture of left lesser toe(s), sequela: Secondary | ICD-10-CM | POA: Diagnosis not present

## 2017-10-28 LAB — BASIC METABOLIC PANEL
ANION GAP: 11 (ref 5–15)
BUN: 33 mg/dL — ABNORMAL HIGH (ref 8–23)
CALCIUM: 9.1 mg/dL (ref 8.9–10.3)
CO2: 30 mmol/L (ref 22–32)
CREATININE: 1.35 mg/dL — AB (ref 0.44–1.00)
Chloride: 99 mmol/L (ref 98–111)
GFR calc non Af Amer: 39 mL/min — ABNORMAL LOW (ref >60)
GFR, EST AFRICAN AMERICAN: 46 mL/min — AB (ref >60)
Glucose, Bld: 306 mg/dL — ABNORMAL HIGH (ref 70–99)
Potassium: 4.4 mmol/L (ref 3.5–5.1)
Sodium: 140 mmol/L (ref 135–145)

## 2017-10-28 LAB — GLUCOSE, CAPILLARY
GLUCOSE-CAPILLARY: 281 mg/dL — AB (ref 70–99)
GLUCOSE-CAPILLARY: 143 mg/dL — AB (ref 70–99)
Glucose-Capillary: 141 mg/dL — ABNORMAL HIGH (ref 70–99)
Glucose-Capillary: 125 mg/dL — ABNORMAL HIGH (ref 70–99)

## 2017-10-28 MED ORDER — ALPRAZOLAM 0.25 MG PO TABS
0.2500 mg | ORAL_TABLET | Freq: Two times a day (BID) | ORAL | Status: DC
  Administered 2017-10-28 – 2017-10-29 (×3): 0.25 mg via ORAL
  Filled 2017-10-28 (×3): qty 1

## 2017-10-28 NOTE — Progress Notes (Signed)
Speech Language Pathology Discharge Summary  Patient Details  Name: Chelsea [name] MRN: [id number] Date of Birth: [birth year]  Today's Date: 10/28/2017 SLP Individual Time: 1000-1100 SLP Individual Time Calculation (min): 60 min   Skilled Therapeutic Interventions:  Skilled treatment session focused on completion of family education and cognitive goals. SLP facilitated session by providing education to the patient's daughter in regards to patient's current swallowing function, diet recommendations, and appropriate textures. Patient's daughter also educated in regards to strategies to utilize at home to maximize recall, problem solving, safety and overall use of cognitive skills to maximize functional independence as well as the importance of 24 hour supervision. She verbalized understanding of all information and handouts were also given to reinforce information.  SLP administered the MoCA-Basic in which patient scored 17/30 points with deficits in short-term recall, attention, executive functioning and calculations noted.  However, also suspect function impacted by fatigue and motivation. Patient left upright in wheelchair with family present. Continue with current plan of care.   Patient has met 4 of 4 long term goals.  Patient to discharge at [facility] level.   Reasons goals not met: N/A   Clinical Impression/Discharge Summary: Patient has made functional gains and has met 4 of 4 LTGs this admission. Currently, patient is currently overall Min A verbal cues for problem solving, recall and awareness in order to complete functional and familiar tasks safely. Patient is also consuming Dys. 3 textures with thin liquids with minimal overt s/s of aspiration and Mod I for use of swallowing compensatory strategies. Patient and family education complete and patient will discharge home with 24 hour supervision. Patient would benefit from f/u SLP services to maximize her cognitive and swallowing function  and overall functional independence in order to reduce caregiver burden.   Care Partner:  Caregiver Able to Provide Assistance: Yes  Type of Caregiver Assistance: Physical;Cognitive  Recommendation:  24 hour supervision/assistance;Home Health SLP  Rationale for SLP Follow Up: Reduce caregiver burden;Maximize cognitive function and independence;Maximize swallowing safety   Equipment: N/A   Reasons for discharge: Treatment goals met;Discharged from hospital   Patient/Family Agrees with Progress Made and Goals Achieved: Yes   Function:   Cognition Comprehension Comprehension assist level: Understands basic 90% of the time/cues < 10% of the time  Expression   Expression assist level: Expresses basic 90% of the time/requires cueing < 10% of the time.  Social Interaction Social Interaction assist level: Interacts appropriately 90% of the time - Needs monitoring or encouragement for participation or interaction.  Problem Solving Problem solving assist level: Solves basic 75 - 89% of the time/requires cueing 10 - 24% of the time  Memory Memory assist level: Recognizes or recalls 75 - 89% of the time/requires cueing 10 - 24% of the time   Venisa Frampton 10/28/2017, 3:29 PM

## 2017-10-28 NOTE — Discharge Summary (Addendum)
Physician Discharge Summary  [name] MRN: [id number] DOB/AGE: 69-13-1952 69 y.o.  Admit date: 10/09/2017 Discharge date: 10/29/2017  Discharge Diagnoses:  Principal Problem:   Stroke due to embolism [facility]) Active Problems:   Hyperlipidemia associated with type 2 diabetes mellitus ([place])   GERD (gastroesophageal reflux disease)   Essential hypertension   Diabetes mellitus type 2 in obese (H[facility])   Benign essential HTN   Acute blood loss anemia   CKD (chronic kidney disease), stage III ([facility])   Abnormal BUN-to-creatinine ratio   Discharged Condition: stable   Significant Diagnostic Studies:  Dg Foot Complete Left Result Date: 10/09/2017 CLINICAL DATA:  Left foot pain near the toes since yesterday. No known injury. EXAM: LEFT FOOT - COMPLETE 3+ VIEW COMPARISON:  Left foot radiographs-05/06/2005 FINDINGS: Suspected osteopenia. Age-indeterminate tiny avulsion fracture involving the medial aspect of the base of the proximal phalanx of the fifth digit. Suspected minimal amount of soft tissue swelling about the forefoot. No additional fractures identified. No significant hallux valgus deformity. Joint spaces are preserved. No erosions. Moderate-sized plantar calcaneal spur. Distal vascular calcifications. No radiopaque foreign body. IMPRESSION: 1. Age-indeterminate tiny avulsion fracture involving the medial aspect of the base of the proximal phalanx of the fifth digit. Correlation for point tenderness at this location is advised. 2. Moderate-sized plantar calcaneal spur. Electronically Signed   By: Sandi Mariscal M.D.   On: 10/09/2017 19:23    Labs:  Basic Metabolic Panel: BMP Latest Ref Rng & Units 10/29/2017 10/28/2017 10/26/2017  Glucose 70 - 99 mg/dL 155(H) 306(H) 153(H)  BUN 8 - 23 mg/dL 35(H) 33(H) 35(H)  Creatinine 0.44 - 1.00 mg/dL 1.34(H) 1.35(H) 1.53(H)  BUN/Creat Ratio 12 - 28 - - -  Sodium 135 - 145 mmol/L 141 140 137  Potassium 3.5 - 5.1 mmol/L 4.0 4.4 4.4  Chloride  98 - 111 mmol/L 104 99 101  CO2 22 - 32 mmol/L '28 30 25  ' Calcium 8.9 - 10.3 mg/dL 8.6(L) 9.1 8.6(L)     CBC: CBC Latest Ref Rng & Units 10/25/2017 10/16/2017 10/10/2017  WBC 4.0 - 10.5 K/uL 6.8 6.8 12.4(H)  Hemoglobin 12.0 - 15.0 g/dL 11.2(L) 11.2(L) 13.1  Hematocrit 36.0 - 46.0 % 36.1 35.2(L) 41.8  Platelets 150 - 400 K/uL 254 268 222    CBG: Recent Labs  Lab 10/27/17 0651 10/27/17 1154 10/27/17 1625 10/27/17 2204 10/28/17 0645  GLUCAP 132* 223* 185* 219* 141*    Brief HPI:   Chelsea [name] is a 67 year old left-handed female with history of HTN, T2DM with retinopathy, alcohol abuse in the past; who was admitted on 10/06/2017 with 2 to 3-day history of blurred vision, headaches, weakness and fall.  Patient noted to have left-sided weakness with ataxia right greater than left.  MRI of brain done revealing chronic small vessel changes with bilateral acute/subacute small vessel infarct.  Dr. Scheryl Marten recommended aspirin/Plavix for 3 weeks followed by aspirin alone.  TEE was negative for p.o. AST or thrombus however patient with refused loop recorder.  30-day event monitor recommended discharge.  Therapies ongoing and patient was noted to have impairments in mobility and self-care tasks.  CIR was recommended due to functional deficits.  Hospital Course: Chelsea [name] was admitted to rehab 10/09/2017 for inpatient therapies to consist of PT, ST and OT at least three hours five days a week. Past admission physiatrist, therapy team and rehab RN have worked together to provide customized collaborative inpatient rehab.  Blood pressures have been monitored on twice  daily basis.  Hydralazine was added and titrated to help with better control.  She has had occasional orthostatic changes and teds and abdominal binder were ordered to help with symptoms.  She was also encouraged to increase fluid intake as was noted to have acute on chronic renal failure.  IV fluids were ordered however nursing was  unable to get a line in.  Serial labs showed some improvement and patient and family advised to increase and keep up with fluid intake past discharge.  Home health nurse to recheck BMET on 7/19 with results to primary MD. diabetes was monitored with a CHF CBG checks.  Lantus has been titrated to 24 units for tighter control.  Bowel program was adjusted to help manage constipation.  CBC at admission showed leukocytosis but patient has been afebrile and no signs of infection noted.  Repeat check shows H&H stable and leukocytosis has resolved.  She was maintained on aspirin and Plavix during her stay.  Plavix on 9/17 as has completed 3 week course.   Patient reported foot pain at admission and x-rays done revealing age indeterminate tiny avulsion fracture at proximal phalanx of fifth digit.  Darco shoe was ordered for support with weightbearing as tolerated.  She has required equal support due to issues with anxiety as well as ongoing psychosocial issues.  Low-dose Xanax was was used on as needed basis and Celexa was added at discharge to help with mood stabilization.  She has been making progress with therapy but continued to be limited by reports of dizziness and fatigue due to premorbid deconditioning as well as occasional orthostatic BP changes.   She continues to require min assist with mobility and self care tasks. Daughter reported issues with memory PTA and will assist patient after discharge.  She will continue to receive follow up [place], [place], [place] and [place] by [place] after discharge.    Rehab course: During patient's stay in rehab weekly team conferences were held to monitor patient's progress, set goals and discuss barriers to discharge. At admission, patient required max assist with mobility and basic self care tasks. She exhibited mild cognitive impairments affecting problem solving, recall and safety with functional and familiar tasks.  She  has had improvement in activity tolerance,  balance, postural control as well as ability to compensate for deficits.  She is able to complete ADL tasks with supervision. She requires min assist with transfers and to ambulate 15' with RW. She requires min verbal cues to complete functional and familiar tasks. She is tolerating dysphagia 3 diet with use of swallow strategies at modified independent level. Family education completed regarding all aspects of care, safety and fall prevention measures.   Disposition:  Home  Diet: Heart Healthy.   Special Instructions: 1. HHRN to draw BMET on 09/19 with results to primary MD. 2. Family to provide supervision and assist with medication management.   Discharge Instructions    Ambulatory referral to Cardiology   Complete by:  As directed    Needs 30 day event monitor for stroke work up.   Ambulatory referral to Physical Medicine Rehab   Complete by:  As directed    1-2 weeks transitional care appt.     Allergies as of 10/29/2017      Reactions   Atarax [hydroxyzine]    Hallucination   Lisinopril Cough   Nsaids Nausea And Vomiting   Penicillins Other (See Comments)   shaking      Medication List    STOP  taking these medications   cloNIDine 0.3 mg/24hr patch Commonly known as:  CATAPRES - Dosed in mg/24 hr   clopidogrel 75 MG tablet Commonly known as:  PLAVIX   glucose blood test strip   insulin aspart 100 UNIT/ML injection Commonly known as:  novoLOG   insulin aspart protamine - aspart (70-30) 100 UNIT/ML FlexPen Commonly known as:  NOVOLOG 70/30 MIX   metFORMIN 500 MG 24 hr tablet Commonly known as:  GLUCOPHAGE-XR   VOLTAREN 1 % Gel Generic drug:  diclofenac sodium     TAKE these medications   ACCU-CHEK SOFTCLIX LANCETS lancets Check 3 times a day as instructed.Diagnosis code E11.9   acetaminophen 325 MG tablet Commonly known as:  TYLENOL Take 2 tablets (650 mg total) by mouth every 6 (six) hours as needed for mild pain or moderate pain.   albuterol 108 (90  Base) MCG/ACT inhaler Commonly known as:  PROVENTIL HFA;VENTOLIN HFA INHALE 2 PUFFS into lungs EVERY 6 HOURS AS NEEDED FOR WHEEZING OR SHORTNESS OF BREATH What changed:  Another medication with the same name was removed. Continue taking this medication, and follow the directions you see here.   ALPRAZolam 0.25 MG tablet Commonly known as:  XANAX Take 1 tablet (0.25 mg total) by mouth 2 (two) times daily as needed for anxiety.   aspirin 81 MG EC tablet Take 1 tablet (81 mg total) by mouth daily.   atorvastatin 80 MG tablet Commonly known as:  LIPITOR TAKE 1 TABLET BY MOUTH EVERY DAY   BLADDER CONTROL PAD REGULAR Misc Use daily as directed   Blood Glucose Monitoring Suppl Kit by Does not apply route. What changed:  Another medication with the same name was removed. Continue taking this medication, and follow the directions you see here.   cetaphil lotion Apply 1 application topically as needed for dry skin.   citalopram 10 MG tablet Commonly known as:  CELEXA Take 1 tablet (10 mg total) by mouth at bedtime.   fluticasone 50 MCG/ACT nasal spray Commonly known as:  FLONASE Use 2 sprays in each nostril once daily   hydrALAZINE 25 MG tablet Commonly known as:  APRESOLINE Take 1 tablet (25 mg total) by mouth every 8 (eight) hours.   Insulin Glargine 100 UNIT/ML Solostar Pen Commonly known as:  LANTUS Inject 22 Units into the skin daily.   losartan 100 MG tablet Commonly known as:  COZAAR Take 1 tablet (100 mg total) by mouth daily.   naphazoline-pheniramine 0.025-0.3 % ophthalmic solution Commonly known as:  NAPHCON-A Place 1 drop into both eyes 4 (four) times daily as needed for irritation.   ramelteon 8 MG tablet Commonly known as:  ROZEREM Take 1 tablet (8 mg total) by mouth at bedtime.   senna-docusate 8.6-50 MG tablet Commonly known as:  Senokot-S Take 2 tablets by mouth at bedtime. Notes to patient:  Can use as needed for constipation.    SURE COMFORT PEN  NEEDLES 31G X 8 MM Misc Generic drug:  Insulin Pen Needle USE 1 pen needle 3 TIMES DAILY AS DIRECTED   verapamil 120 MG CR tablet Commonly known as:  CALAN-SR Take 1 tablet (120 mg total) by mouth daily.      Follow-up Information    Meredith Staggers, MD Follow up.   Specialty:  Physical Medicine and Rehabilitation Why:  Office will call you for follow up appointment Contact information: [place] [address] [phone]        [name].  Call in 1 day(s).   Why:  for follow up appointment Contact information: [address]     [address] [place] [address] [phone]       Carroll Sage, MD Follow up on 11/05/2017.   Specialty:  Internal Medicine Why:  @ 2:15 pm (hospital follow up appt) Contact information: [address] [place] [address] [phone]           Signed: Bary Leriche 10/30/2017, 5:32 PM

## 2017-10-28 NOTE — Progress Notes (Signed)
Occupational Therapy Discharge Summary  Patient Details  Name: Chelsea [name] MRN: [id number] Date of Birth: 1950-05-28  Today's Date: 10/28/2017 OT Individual Time: 0901-1000 OT Individual Time Calculation (min): 59 min    Patient has met 7 of 12 long term goals due to improved activity tolerance, improved balance, ability to compensate for deficits, functional use of  RIGHT upper extremity, improved attention, improved awareness and improved coordination.  Patient to discharge at overall Supervision level.  Patient's care partner is independent to provide the necessary physical assistance at discharge.    Reasons goals not met: pt remained limited by her drops in BP with transfers, making (S) imparitive during all functional transfers and mobility.   Recommendation:  Patient will benefit from ongoing skilled OT services in home health setting to continue to advance functional skills in the area of BADL.  Equipment: w/c Pt's dtr was able to privately obtain a BSC, TTB, and RW.   Reasons for discharge: treatment goals met and discharge from hospital  Patient/family agrees with progress made and goals achieved: Yes  Skilled OT Intervention:  Pt and dtr present throughout session for family edu. Pt's dtr instructed/provided demo in self positioning for fall prevention, body mechanics, and to maximize pt independence. Pt completed stand pivot transfer, tub bench transfer, and furniture transfers with dtr providing (S). Demo and return provided re donning compression stockings. Edu provided re importance of returning to IADLs of choice with keeping energy conservation and fall prevention strategies in mind. Pt was left sitting up in w/c in room with family present.   OT Discharge Precautions/Restrictions  Precautions Precautions: Fall Restrictions Weight Bearing Restrictions: No Other Position/Activity Restrictions: WBAT on L foot General PT Missed Treatment Reason: Patient  fatigue Vital Signs Therapy Vitals BP: (!) 165/74 Patient Position (if appropriate): Sitting Pain Pain Assessment Pain Scale: 0-10 Pain Score: 0-No pain ADL ADL ADL Comments: See functional navigator Vision Baseline Vision/History: Cataracts;Retinopathy Patient Visual Report: No change from baseline Vision Assessment?: No apparent visual deficits Perception  Perception: Within Functional Limits Praxis Praxis: Intact Cognition Overall Cognitive Status: Impaired/Different from baseline Arousal/Alertness: Awake/alert Orientation Level: Oriented X4 Attention: Selective;Alternating Sustained Attention: Appears intact Selective Attention: Appears intact Alternating Attention: Impaired Alternating Attention Impairment: Verbal complex;Functional complex Memory: Impaired Memory Impairment: Decreased recall of new information;Decreased short term memory Decreased Short Term Memory: Verbal complex Awareness: Impaired Awareness Impairment: Emergent impairment Problem Solving: Impaired Problem Solving Impairment: Functional complex Safety/Judgment: Impaired Comments: Pt still requires vc for safety during transfers Sensation Sensation Light Touch: Appears Intact Hot/Cold: Appears Intact Stereognosis: Appears Intact Coordination Gross Motor Movements are Fluid and Coordinated: No Fine Motor Movements are Fluid and Coordinated: No Coordination and Movement Description: Pt with reduced fluid movement in R UE, but with great progress since admission Motor  Motor Motor: Clonus Motor - Discharge Observations: pt limited by generalized weakness, obesity, and functional activity tolerance Mobility  Bed Mobility Bed Mobility: Rolling Right;Rolling Left;Right Sidelying to Sit;Sit to Supine;Supine to Sit Rolling Right: Independent with assistive device Rolling Left: Independent with assistive device Right Sidelying to Sit: Independent with assistive device Supine to Sit: Independent  with assistive device Sit to Supine: Independent with assistive device Transfers Sit to Stand: Supervision/Verbal cueing Stand to Sit: Supervision/Verbal cueing  Trunk/Postural Assessment  Cervical Assessment Cervical Assessment: Within Functional Limits Thoracic Assessment Thoracic Assessment: Exceptions to WFL(kyphotic posture) Lumbar Assessment Lumbar Assessment: Exceptions to WFL(posterior pelvic tilt) Postural Control Postural Control: Deficits on evaluation Protective Responses: delayed  Balance Balance Balance Assessed: Yes  Static Sitting Balance Static Sitting - Balance Support: Feet supported Static Sitting - Level of Assistance: 6: Modified independent (Device/Increase time) Dynamic Sitting Balance Dynamic Sitting - Balance Support: Feet supported Dynamic Sitting - Level of Assistance: 6: Modified independent (Device/Increase time) Static Standing Balance Static Standing - Balance Support: No upper extremity supported;During functional activity Static Standing - Level of Assistance: 5: Stand by assistance Dynamic Standing Balance Dynamic Standing - Balance Support: During functional activity;Bilateral upper extremity supported Dynamic Standing - Level of Assistance: 4: Min assist Extremity/Trunk Assessment RUE Assessment RUE Assessment: Exceptions to [facility] General Strength Comments: Coordination/proprioception still affected in R UE, pt frequently over/under shoots with volitional movement LUE Assessment LUE Assessment: Within Functional Limits   See Function Navigator for Current Functional Status.  Curtis Sites 10/28/2017, 12:25 PM

## 2017-10-28 NOTE — Progress Notes (Signed)
Physical Therapy Discharge Summary  Patient Details  Name: Chelsea [name] MRN: [id number] Date of Birth: 1950/04/30   Patient has met 7 of 10 long term goals due to improved activity tolerance, improved balance, improved postural control, increased strength, decreased pain, ability to compensate for deficits, functional use of  left upper extremity and left lower extremity and improved attention.  Patient to discharge at a wheelchair level [place].   Patient's care partner is independent to provide the necessary physical assistance at discharge.  PT trained dtr Chelsea [name] in bed mobility, basic transfer, w/c propulsion and w/c mgt, simulated car transfer and 4 steps 2 rails for home entry.  PT stressed that family should not attempt to transfer pt to a low sofa at home, or ambulate with pt until HHPT deems this safe and trains them. Chelsea [name] verbalized understanding.    Reasons goals not met: pt c/o dizziness and fatigue often upon standing, although she does not appear to have postural hypotension.  Pt was quite deconditioned PTA.   Recommendation:  Patient will benefit from ongoing skilled PT services in home health setting to continue to advance safe functional mobility, address ongoing impairments in endurance, balance, LUE,LLE motor control, and minimize fall risk.  Equipment: w/c with basic cushion.  Pt owns a [id number], which is not appropriate at this time.  PT informed Chelsea [name], CSW of this, and recommended a RW; unclear if family will buy one.  Reasons for discharge: treatment goals met and discharge from hospital  Patient/family agrees with progress made and goals achieved: Yes  PT Discharge Precautions/Restrictions- falls   Vital Signs Therapy Vitals Temp: (!) 97.3 F (36.3 C) Temp Source: Oral Pulse Rate: 86 Resp: 20 BP: (!) 162/64 Patient Position (if appropriate): Lying Oxygen Therapy SpO2: 100 % O2 Device: Room Air Pain- pt denies   Vision/Perception - cataracts and  retinopathy; c/o of increased blurriness with fatigue    Cognition Overall Cognitive Status: Impaired/Different from baseline Arousal/Alertness: Awake/alert Orientation Level: Oriented X4 Sustained Attention: Appears intact Selective Attention: Appears intact Alternating Attention: Impaired Alternating Attention Impairment: Verbal complex;Functional complex Memory: Impaired Memory Impairment: Decreased recall of new information;Decreased short term memory;Retrieval deficit Decreased Short Term Memory: Verbal complex Awareness: Impaired Awareness Impairment: Emergent impairment Problem Solving: Impaired Problem Solving Impairment: Functional complex Safety/Judgment: Impaired   Motor  Motor Motor: Abnormal postural alignment and control;Hemiplegia Motor - Discharge Observations: pt continues to drift R upon standing up, but L toe less painfual than at admission    Locomotion  Gait Ambulation: Yes Gait Assistance: Minimal Assistance - Patient > 75% Gait Distance (Feet): 15 Feet Assistive device: Rolling walker;Other (Comment)(today pt used hall way railing; when feeling strong she uses RW successfully) Gait Assistance Details: Manual facilitation for weight shifting;Verbal cues for safe use of DME/AE Gait Gait: Yes Gait Pattern: Impaired Gait Pattern: Step-through pattern;Decreased step length - left;Decreased step length - right;Trunk flexed;Decreased trunk rotation;Decreased weight shift to left;Trunk rotated posteriorly on left Stairs / Additional Locomotion Stairs: Yes Stairs Assistance: Moderate Assistance - Patient 50 - 74% Stair Management Technique: Two rails Number of Stairs: 4 Height of Stairs: 6 Wheelchair Mobility Wheelchair Mobility: Yes Wheelchair Assistance: Chartered loss adjuster: Both upper extremities Wheelchair Parts Management: Needs assistance Distance: 25  Trunk/Postural Assessment  Cervical Assessment Cervical Assessment:  Within Functional Limits Thoracic Assessment Thoracic Assessment: Exceptions to WFL(kyphotic posture) Lumbar Assessment Lumbar Assessment: Exceptions to WFL(posterior pelvic tilt) Postural Control Postural Control: Deficits on evaluation Righting Reactions: very delayed when she drifts r in  standing Protective Responses: delayed Postural Limitations: pt leans R in sitting and standing  Balance Balance Balance Assessed: Yes Static Sitting Balance Static Sitting - Balance Support: Feet supported Static Sitting - Level of Assistance: 6: Modified independent (Device/Increase time) Dynamic Sitting Balance Dynamic Sitting - Balance Support: Feet supported Dynamic Sitting - Level of Assistance: 6: Modified independent (Device/Increase time) Static Standing Balance Static Standing - Balance Support: No upper extremity supported;During functional activity Static Standing - Level of Assistance: 5: Stand by assistance Dynamic Standing Balance Dynamic Standing - Balance Support: During functional activity;Bilateral upper extremity supported Dynamic Standing - Level of Assistance: 4: Min assist Extremity Assessment - NT           See Function Navigator for Current Functional Status.  Chelsea [name] 10/28/2017, 5:43 PM

## 2017-10-28 NOTE — Progress Notes (Signed)
[place] PHYSICAL MEDICINE & REHABILITATION     PROGRESS NOTE  Subjective: Pt lying in bed. Seems anxious/discouraged. Frustrated that team couldn't get IV started.   ROS: Patient denies fever, rash, sore throat, blurred vision, nausea, vomiting, diarrhea, cough, shortness of breath or chest pain, joint or back pain, headache,    Objective:  No results found. No results for input(s): WBC, HGB, HCT, PLT in the last 72 hours. Recent Labs    10/26/17 0731  NA 137  K 4.4  CL 101  GLUCOSE 153*  BUN 35*  CREATININE 1.53*  CALCIUM 8.6*   CBG (last 3)  Recent Labs    10/27/17 1625 10/27/17 2204 10/28/17 0645  GLUCAP 185* 219* 141*    Wt Readings from Last 3 Encounters:  10/23/17 105.3 kg  10/06/17 108.9 kg  10/06/17 109.3 kg     Intake/Output Summary (Last 24 hours) at 10/28/2017 0847 Last data filed at 10/27/2017 1818 Gross per 24 hour  Intake 470 ml  Output 1 ml  Net 469 ml    Vital Signs: Blood pressure (!) 173/75, pulse (!) 103, temperature 98.8 F (37.1 C), resp. rate 18, height 5\' 6"  (1.676 m), weight 105.3 kg, SpO2 99 %. Physical Exam:  Constitutional: No distress . Vital signs reviewed. HEENT: EOMI, oral membranes moist Neck: supple Cardiovascular: RRR without murmur. No JVD    Respiratory: CTA Bilaterally without wheezes or rales. Normal effort    GI: BS +, non-tender, non-distended  Musc: No edema or tenderness in extremities. Neurological: She isalertand oriented dysarthria Motor: RUE 5/5.  LUE: 4+/5 prox to distal LLE: Hip flexion 4/5, knee extension 4/5, ankle dorsiflexion 4+/5 Skin: Skin iswarmand dry.  Psychiatric: normal mood. Normal affect.  Assessment/Plan: 1. Functional deficits secondary to bilateral embolic cerebral infarcts which require 3+ hours per day of interdisciplinary therapy in a comprehensive inpatient rehab setting. Physiatrist is providing close team supervision and 24 hour management of active medical problems listed  below. Physiatrist and rehab team continue to assess barriers to discharge/monitor patient progress toward functional and medical goals.  Function:  Bathing Bathing position   Position: Shower  Bathing parts Body parts bathed by patient: Right arm, Left arm, Chest, Abdomen, Front perineal area, Right upper leg, Left upper leg, Right lower leg, Left lower leg, Buttocks, Back Body parts bathed by helper: Back  Bathing assist Assist Level: Supervision or verbal cues Assistive Device Comment: LH sponge    Upper Body Dressing/Undressing Upper body dressing   What is the patient wearing?: Pull over shirt/dress     Pull over shirt/dress - Perfomed by patient: Thread/unthread right sleeve, Thread/unthread left sleeve, Put head through opening, Pull shirt over trunk          Upper body assist Assist Level: Set up   Set up : To obtain clothing/put away  Lower Body Dressing/Undressing Lower body dressing   What is the patient wearing?: Underwear Underwear - Performed by patient: Thread/unthread right underwear leg, Thread/unthread left underwear leg, Pull underwear up/down Underwear - Performed by helper: Pull underwear up/down       Non-skid slipper socks- Performed by helper: Don/doff right sock, Don/doff left sock                  Lower body assist Assist for lower body dressing: Supervision or verbal cues      Toileting Toileting   Toileting steps completed by patient: Adjust clothing prior to toileting, Performs perineal hygiene, Adjust clothing after toileting Toileting steps completed  by helper: Adjust clothing after toileting Toileting Assistive Devices: Grab bar or rail  Toileting assist Assist level: Supervision or verbal cues   Transfers Chair/bed transfer   Chair/bed transfer method: Stand pivot Chair/bed transfer assist level: Moderate assist (Pt 50 - 74%/lift or lower) Chair/bed transfer assistive device: Armrests, Medical sales representative  Ambulation activity did not occur: Safety/medical concerns(dizziness upon standing)   Max distance: 6 Assist level: Maximal assist (Pt 25 - 49%)(+ a person following with w/c)   Wheelchair   Type: Manual Max wheelchair distance: 50 Assist Level: Touching or steadying assistance (Pt > 75%)  Cognition Comprehension Comprehension assist level: Understands basic 90% of the time/cues < 10% of the time  Expression Expression assist level: Expresses basic 90% of the time/requires cueing < 10% of the time.  Social Interaction Social Interaction assist level: Interacts appropriately 90% of the time - Needs monitoring or encouragement for participation or interaction.  Problem Solving Problem solving assist level: Solves basic 50 - 74% of the time/requires cueing 25 - 49% of the time  Memory Memory assist level: Recognizes or recalls 75 - 89% of the time/requires cueing 10 - 24% of the time   Medical Problem List and Plan: 1.Functional deficitssecondary tobilateral embolic stroke. Continue CIR  -tentatively planning for dc tomorrow 2. DVT Prophylaxis/Anticoagulation: Pharmaceutical:Lovenox 3. Pain Management:Voltaren gel to left foot. -left avulsion fx prox 5th phalanx - darco shoe/ WBAT LLE  4. Mood:LCSW to follow for evaluation and support. 5. Neuropsych: This patientiscapable of making decisions on herown behalf. 6. Skin/Wound Care:routine pressure relief measures. 7. Fluids/Electrolytes/Nutrition:Monitor I/O. -push PO 8. T2DMwith retinopathy: Hgb A1C-7.6.  Avoid metformin due to elevated SCr.    increased  lantus to 24u on 9/14  Inconsistent control at present 9. HTN: [place] BP bid  On Losartan and Verapamilto beresumed  Hydralazine 10 3 times a day started on 9/2, increased to 25 on 9/3,  Orthostatic hypotension    -VS do not suggest orthostasis at this time   -continueTEDS/Abdominal binder ordered, encouraging fluid intake  per below   -Continue to hold clonidine.   -? Anxiety    -will schedule xanax 0.25mg  bid  10. Dyslipidemia: Continuelipitor.  12. CKD: Baseline SCr 1.3 to 1.5 range in the past 6 months.  Creatinine 1.53 on 9/14, remains within baseline range  -encourage fluids  Labs pending, hold on IVF for the moment 13. Dyspnea- new onset:   -PT addressing activity tolerance, improving 14.  Nausea: Improved with bowel movements  15. Leukocytosis: Resolved  WBCs 6.8 on 9/13  afebrile 16. ABLA  Hb 11.2 on 9/13      LOS (Days) [place] EVALUATION WAS PERFORMED  Meredith Staggers, MD 10/28/2017 8:47 AM

## 2017-10-28 NOTE — Progress Notes (Signed)
IV consult notified again. Notified PA, Pam approximately at 1030. Called Lab to draw morning labs. Labs drawn. Will continue to follow up and Encourage Fluids.

## 2017-10-28 NOTE — Progress Notes (Addendum)
Physical Therapy Session Note  Patient Details  Name: Chelsea [name] MRN: [id number] Date of Birth: 1950-11-12  Today's Date: 10/28/2017 tx 1:  PT Individual Time: 1100-1155 PT Individual Time Calculation (min): 55 min   Short Term Goals:  Week 3:  PT Short Term Goal 1 (Week 3): = LTG  Skilled Therapeutic Interventions/Progress Updates:   Dtr [name] here for family ed.  Pt performed simulated car transfer to sedan ht seat with PT, then with [name], safely.  With [name], pt managed 4 steps 2 rails to simulate Tara's home entry.  Pt reported feeling very tired because she had been OOB for several hours.   PT demonstrated bed mobility and discussed furniture transfer to reclining sofa that [name]  has at home.  [name] reported that her sofa is low and soft; PT explained that arising from a low surface is especially difficult, so PT recommended that pt use bed and w/c only until HHPT assesses home furniture.   Pt propelled w/c over level tile and carpet x 25' with supervision, and locked bil brakes with 1 cue.     Pt declined gait training this session, but agreed that she would try during afternoon session.  She said that she would ask for help to get back to bed as soon as she finished eating.   Pt missed 5 minutes of session due to fatigue.   tx 2:  1300-1330, 30 min individual tx  Pt still sitting up in w/c, and had no been back to bed.  She c/o fatgiue.  Attempted gait training with RW.  Mod cues for set- up sit> stand.  Upon standing, mod assist needed due to overshift to R.  Pt able to stand with = LE wt bearing with cues.  Pt swaying M><L and stated that she was dizzy and could not walk.  Seated BP = 173/76; HR 94.  Pt willing to try gait training using hallway railing.  Pt ambulated x 15' with bil hands on railing fading to R hand on railing, L hand in PT's hand, min assist.    [name] assisted pt w/c> bed stand pivot transfer.    Pt left sitting EOB with dtr [name] attending her.  Therapy  Documentation Precautions:  Precautions Precautions: Fall Precaution Comments: R lateral lean Restrictions Weight Bearing Restrictions: No Other Position/Activity Restrictions: WBAT on L foot  Wearing L post-op shoe  Pain: Pain Assessment Pain Scale: 0-10 Pain Score: 0-No pain am and pm sessions     See Function Navigator for Current Functional Status.   Therapy/Group: Individual Therapy  Kayvon Mo 10/28/2017, 12:09 PM

## 2017-10-29 ENCOUNTER — Telehealth: Payer: Self-pay

## 2017-10-29 DIAGNOSIS — E1169 Type 2 diabetes mellitus with other specified complication: Secondary | ICD-10-CM | POA: Diagnosis not present

## 2017-10-29 DIAGNOSIS — D62 Acute posthemorrhagic anemia: Secondary | ICD-10-CM | POA: Diagnosis not present

## 2017-10-29 DIAGNOSIS — N179 Acute kidney failure, unspecified: Secondary | ICD-10-CM | POA: Diagnosis not present

## 2017-10-29 DIAGNOSIS — G894 Chronic pain syndrome: Secondary | ICD-10-CM | POA: Diagnosis not present

## 2017-10-29 DIAGNOSIS — E785 Hyperlipidemia, unspecified: Secondary | ICD-10-CM | POA: Diagnosis not present

## 2017-10-29 DIAGNOSIS — I6349 Cerebral infarction due to embolism of other cerebral artery: Secondary | ICD-10-CM | POA: Diagnosis not present

## 2017-10-29 DIAGNOSIS — F410 Panic disorder [episodic paroxysmal anxiety] without agoraphobia: Secondary | ICD-10-CM | POA: Diagnosis not present

## 2017-10-29 DIAGNOSIS — I69354 Hemiplegia and hemiparesis following cerebral infarction affecting left non-dominant side: Secondary | ICD-10-CM | POA: Diagnosis not present

## 2017-10-29 DIAGNOSIS — R7989 Other specified abnormal findings of blood chemistry: Secondary | ICD-10-CM | POA: Diagnosis not present

## 2017-10-29 DIAGNOSIS — E669 Obesity, unspecified: Secondary | ICD-10-CM | POA: Diagnosis not present

## 2017-10-29 DIAGNOSIS — N183 Chronic kidney disease, stage 3 (moderate): Secondary | ICD-10-CM | POA: Diagnosis not present

## 2017-10-29 LAB — BASIC METABOLIC PANEL
ANION GAP: 9 (ref 5–15)
BUN: 35 mg/dL — AB (ref 8–23)
CALCIUM: 8.6 mg/dL — AB (ref 8.9–10.3)
CO2: 28 mmol/L (ref 22–32)
Chloride: 104 mmol/L (ref 98–111)
Creatinine, Ser: 1.34 mg/dL — ABNORMAL HIGH (ref 0.44–1.00)
GFR calc Af Amer: 46 mL/min — ABNORMAL LOW (ref >60)
GFR, EST NON AFRICAN AMERICAN: 40 mL/min — AB (ref >60)
GLUCOSE: 155 mg/dL — AB (ref 70–99)
Potassium: 4 mmol/L (ref 3.5–5.1)
Sodium: 141 mmol/L (ref 135–145)

## 2017-10-29 LAB — GLUCOSE, CAPILLARY
GLUCOSE-CAPILLARY: 249 mg/dL — AB (ref 70–99)
Glucose-Capillary: 142 mg/dL — ABNORMAL HIGH (ref 70–99)

## 2017-10-29 MED ORDER — CITALOPRAM HYDROBROMIDE 10 MG PO TABS: 10.0000 mg | ORAL_TABLET | Freq: Every day | ORAL | 0 refills | Status: DC

## 2017-10-29 MED ORDER — VERAPAMIL HCL ER 120 MG PO TBCR: 120.0000 mg | EXTENDED_RELEASE_TABLET | Freq: Every day | ORAL | 0 refills | Status: DC

## 2017-10-29 MED ORDER — ALPRAZOLAM 0.25 MG PO TABS: 0.2500 mg | ORAL_TABLET | Freq: Two times a day (BID) | ORAL | 0 refills | Status: DC | PRN

## 2017-10-29 MED ORDER — SENNOSIDES-DOCUSATE SODIUM 8.6-50 MG PO TABS: 2.0000 | ORAL_TABLET | Freq: Every day | ORAL | 0 refills | Status: DC

## 2017-10-29 MED ORDER — INSULIN GLARGINE 100 UNIT/ML SOLOSTAR PEN: 22.0000 [IU] | PEN_INJECTOR | Freq: Every day | SUBCUTANEOUS | 11 refills | Status: DC

## 2017-10-29 MED ORDER — CITALOPRAM HYDROBROMIDE 10 MG PO TABS: 10.0000 mg | ORAL_TABLET | Freq: Every day | ORAL | Status: DC

## 2017-10-29 MED ORDER — ALPRAZOLAM 0.25 MG PO TABS: 0.2500 mg | ORAL_TABLET | Freq: Two times a day (BID) | ORAL | Status: DC | PRN

## 2017-10-29 MED ORDER — RAMELTEON 8 MG PO TABS: 8.0000 mg | ORAL_TABLET | Freq: Every day | ORAL | 0 refills | Status: DC

## 2017-10-29 MED ORDER — HYDRALAZINE HCL 25 MG PO TABS: 25.0000 mg | ORAL_TABLET | Freq: Three times a day (TID) | ORAL | 0 refills | Status: DC

## 2017-10-29 NOTE — Progress Notes (Signed)
[place] PHYSICAL MEDICINE & REHABILITATION     PROGRESS NOTE  Subjective: Pt feeling better and felt that things went well yesterday. Dizziness less. "i'm going to work even harder at home"  ROS: Patient denies fever, rash, sore throat, blurred vision, nausea, vomiting, diarrhea, cough, shortness of breath or chest pain, joint or back pain, headache, or mood change.   Objective:  No results found. No results for input(s): WBC, HGB, HCT, PLT in the last 72 hours. Recent Labs    10/28/17 1102 10/29/17 0721  NA 140 141  K 4.4 4.0  CL 99 104  GLUCOSE 306* 155*  BUN 33* 35*  CREATININE 1.35* 1.34*  CALCIUM 9.1 8.6*   CBG (last 3)  Recent Labs    10/28/17 1148 10/28/17 1630 10/28/17 2126  GLUCAP 281* 125* 143*    Wt Readings from Last 3 Encounters:  10/23/17 105.3 kg  10/06/17 108.9 kg  10/06/17 109.3 kg     Intake/Output Summary (Last 24 hours) at 10/29/2017 0859 Last data filed at 10/29/2017 0745 Gross per 24 hour  Intake 800 ml  Output -  Net 800 ml    Vital Signs: Blood pressure (!) 138/97, pulse 97, temperature 98.2 F (36.8 C), resp. rate 18, height 5\' 6"  (1.676 m), weight 105.3 kg, SpO2 100 %. Physical Exam:  Constitutional: No distress . Vital signs reviewed. HEENT: EOMI, oral membranes moist Neck: supple Cardiovascular: RRR without murmur. No JVD    Respiratory: CTA Bilaterally without wheezes or rales. Normal effort    GI: BS +, non-tender, non-distended  Musc: No edema or tenderness in extremities. Neurological: She isalertand oriented dysarthria Motor: RUE 5/5.  LUE: 4+/5 prox to distal LLE: Hip flexion 4/5, knee extension 4/5, ankle dorsiflexion 4+/5 Skin: Skin iswarmand dry.  Psychiatric: pleasant, noraml affect  Assessment/Plan: 1. Functional deficits secondary to bilateral embolic cerebral infarcts which require 3+ hours per day of interdisciplinary therapy in a comprehensive inpatient rehab setting. Physiatrist is providing close  team supervision and 24 hour management of active medical problems listed below. Physiatrist and rehab team continue to assess barriers to discharge/monitor patient progress toward functional and medical goals.  Function:  Bathing Bathing position   Position: Shower  Bathing parts Body parts bathed by patient: Right arm, Left arm, Chest, Abdomen, Front perineal area, Right upper leg, Left upper leg, Right lower leg, Left lower leg, Buttocks, Back Body parts bathed by helper: Back  Bathing assist Assist Level: Supervision or verbal cues Assistive Device Comment: LH sponge    Upper Body Dressing/Undressing Upper body dressing   What is the patient wearing?: Pull over shirt/dress     Pull over shirt/dress - Perfomed by patient: Thread/unthread right sleeve, Thread/unthread left sleeve, Put head through opening, Pull shirt over trunk          Upper body assist Assist Level: Set up   Set up : To obtain clothing/put away  Lower Body Dressing/Undressing Lower body dressing   What is the patient wearing?: Underwear Underwear - Performed by patient: Thread/unthread right underwear leg, Thread/unthread left underwear leg, Pull underwear up/down Underwear - Performed by helper: Pull underwear up/down       Non-skid slipper socks- Performed by helper: Don/doff right sock, Don/doff left sock                  Lower body assist Assist for lower body dressing: Supervision or verbal cues      Toileting Toileting   Toileting steps completed by patient: Adjust  clothing prior to toileting, Performs perineal hygiene, Adjust clothing after toileting Toileting steps completed by helper: Adjust clothing after toileting Toileting Assistive Devices: Grab bar or rail  Toileting assist Assist level: Supervision or verbal cues   Transfers Chair/bed transfer   Chair/bed transfer method: Stand pivot Chair/bed transfer assist level: Touching or steadying assistance (Pt > 75%) Chair/bed  transfer assistive device: Armrests, Medical sales representative Ambulation activity did not occur: Safety/medical concerns(dizziness upon standing)   Max distance: 15 Assist level: Touching or steadying assistance (Pt > 75%)   Wheelchair   Type: Manual Max wheelchair distance: 25 Assist Level: Supervision or verbal cues  Cognition Comprehension Comprehension assist level: Understands basic 90% of the time/cues < 10% of the time  Expression Expression assist level: Expresses basic 90% of the time/requires cueing < 10% of the time.  Social Interaction Social Interaction assist level: Interacts appropriately 90% of the time - Needs monitoring or encouragement for participation or interaction.  Problem Solving Problem solving assist level: Solves basic 75 - 89% of the time/requires cueing 10 - 24% of the time  Memory Memory assist level: Recognizes or recalls 75 - 89% of the time/requires cueing 10 - 24% of the time   Medical Problem List and Plan: 1.Functional deficitssecondary tobilateral embolic stroke.  -can dc home today  -HH therapies, RN to check BMET on 9/19  -Patient to see Rehab MD/provider in the office for transitional care encounter in 1-2 weeks.  2. DVT Prophylaxis/Anticoagulation: Pharmaceutical:Lovenox 3. Pain Management:Voltaren gel to left foot. -left avulsion fx prox 5th phalanx - darco shoe/ WBAT LLE  4. Mood:will send home on scheduled celexa 10mg  QHS  -may use xanax prn for anxiety  -will discuss more with patient at her follow up visit at office 5. Neuropsych: This patientiscapable of making decisions on herown behalf. 6. Skin/Wound Care:routine pressure relief measures. 7. Fluids/Electrolytes/Nutrition:Monitor I/O. -push PO 8. T2DMwith retinopathy: Hgb A1C-7.6.  Avoid metformin due to elevated SCr.    increased  lantus to 24u on 9/14  Inconsistent but improving control. Will need further titration  as outpt  -will need to check cbg's regularly at home 9. HTN: Montor BP bid  On Losartan and Verapamil   Hydralazine 10 3 times a day started on 9/2, increased to 25 on 9/3,  Orthostatic hypotension    -VS do not suggest orthostasis at this time   -continueTEDS/Abdominal binder ordered, encouraging fluid intake per below   -Continue to hold clonidine.   -likely Anxiety effect    - xanax 0.25mg  bid was helpful 10. Dyslipidemia: Continuelipitor.  12. CKD: Baseline SCr 1.3 to 1.5 range in the past 6 months.  Creatinine down to 1.34, remains within baseline range  -BUN sl increased to 35  -push fluids at home and recheck labs thursday 13. Dyspnea- new onset:   -PT addressing activity tolerance, improving 14.  Nausea: Improved with bowel movements  15. Leukocytosis: Resolved  WBCs 6.8 on 9/13  afebrile 16. ABLA  Hb 11.2 on 9/13      LOS (Days) 20 A FACE TO FACE EVALUATION WAS PERFORMED  Meredith Staggers, MD 10/29/2017 8:59 AM

## 2017-10-29 NOTE — Progress Notes (Signed)
Patient being d/c home with instructions and education about DASH Diet. IV removed and intact No c/o pain at this time. Verbalizes understanding and awaiting transport down by staff.

## 2017-10-29 NOTE — Discharge Instructions (Signed)
Inpatient Rehab Discharge Instructions  JACAYLA NORDELL Discharge date and time: 10/29/17   Activities/Precautions/ Functional Status: Activity: No lifting, driving, or strenuous exercise till cleared by MD.  Diet: cardiac diet and diabetic diet Wound Care: none needed   Functional status:  ___ No restrictions     ___ Walk up steps independently _X__ 24/7 supervision/assistance   ___ Walk up steps with assistance ___ Intermittent supervision/assistance  ___ Bathe/dress independently ___ Walk with walker     _X__ Bathe/dress with assistance ___ Walk Independently    ___ Shower independently ___ Walk with assistance    ___ Shower with assistance _X__ No alcohol     ___ Return to work/school ________    COMMUNITY REFERRALS UPON DISCHARGE:    Home Health:   PT     OT     ST                     Agency:  [facility] Phone: [phone]   Medical Equipment/Items Ordered: wheelchair, cushion                                                     Agency/Supplier:  [facility] @ [phone]   GENERAL COMMUNITY RESOURCES FOR PATIENT/FAMILY:  Support Groups:  Stroke Support Group (see handout)   Special Instructions: 1. Stop using alcohol.     2. Monitor blood sugars 2-3 times a day before meals and/or at bedtime.   STROKE/TIA DISCHARGE INSTRUCTIONS SMOKING Cigarette smoking nearly doubles your risk of having a stroke & is the single most alterable risk factor  If you smoke or have smoked in the last 12 months, you are advised to quit smoking for your health.  Most of the excess cardiovascular risk related to smoking disappears within a year of stopping.  Ask you doctor about anti-smoking medications   Quit Line: 1-800-QUIT NOW  Free Smoking Cessation Classes [phone]  CHOLESTEROL Know your levels; limit fat & cholesterol in your diet  Lipid Panel     Component Value Date/Time   CHOL 252 (H) 10/07/2017 0507   TRIG 109 10/07/2017 0507   HDL 53 10/07/2017  0507   CHOLHDL 4.8 10/07/2017 0507   VLDL 22 10/07/2017 0507   LDLCALC 177 (H) 10/07/2017 0507      Many patients benefit from treatment even if their cholesterol is at goal.  Goal: Total Cholesterol (CHOL) less than 160  Goal:  Triglycerides (TRIG) less than 150  Goal:  HDL greater than 40  Goal:  LDL (LDLCALC) less than 100   BLOOD PRESSURE American Stroke Association blood pressure target is less that 120/80 mm/Hg  Your discharge blood pressure is:  BP: (!) 138/97  Monitor your blood pressure  Limit your salt and alcohol intake  Many individuals will require more than one medication for high blood pressure  DIABETES (A1c is a blood sugar average for last 3 months) Goal HGBA1c is under 7% (HBGA1c is blood sugar average for last 3 months)  Diabetes:     Lab Results  Component Value Date   HGBA1C 7.6 (H) 10/07/2017     Your HGBA1c can be lowered with medications, healthy diet, and exercise.  Check your blood sugar as directed by your physician  Call your physician if you experience unexplained or low blood sugars.  PHYSICAL ACTIVITY/REHABILITATION Goal is 30 minutes at least 4 days per week  Activity: No driving, Therapies: see above Return to work: NA  Activity decreases your risk of heart attack and stroke and makes your heart stronger.  It helps control your weight and blood pressure; helps you relax and can improve your mood.  Participate in a regular exercise program.  Talk with your doctor about the best form of exercise for you (dancing, walking, swimming, cycling).  DIET/WEIGHT Goal is to maintain a healthy weight  Your discharge diet is:  Diet Order            DIET DYS 3 Room service appropriate? Yes; Fluid consistency: Thin  Diet effective now              liquids Your height is:  Height: 5\' 6"  (167.6 cm) Your current weight is: Weight: 105.3 kg Your Body Mass Index (BMI) is:  BMI (Calculated): 37.49  Following the type of diet specifically  designed for you will help prevent another stroke.  Your goal weight is: 155 lbs  Your goal Body Mass Index (BMI) is 19-24.  Healthy food habits can help reduce 3 risk factors for stroke:  High cholesterol, hypertension, and excess weight.  RESOURCES Stroke/Support Group:  Call [phone]   STROKE EDUCATION PROVIDED/REVIEWED AND GIVEN TO PATIENT Stroke warning signs and symptoms How to activate emergency medical system (call 911). Medications prescribed at discharge. Need for follow-up after discharge. Personal risk factors for stroke. Pneumonia vaccine given:  Flu vaccine given:  My questions have been answered, the writing is legible, and I understand these instructions.  I will adhere to these goals & educational materials that have been provided to me after my discharge from the hospital.     My questions have been answered and I understand these instructions. I will adhere to these goals and the provided educational materials after my discharge from the hospital.  Patient/Caregiver Signature _______________________________ Date __________  Clinician Signature _______________________________________ Date __________  Please bring this form and your medication list with you to all your follow-up doctor's appointments.

## 2017-10-29 NOTE — Progress Notes (Signed)
Social Work  Discharge Note  The overall goal for the admission was met for:   Discharge location: Yes - home with family to provide 24/7 assistance  Length of Stay: Yes - 20 days  Discharge activity level: Yes - overall met most goals  Home/community participation: Yes  Services provided included: MD, RD, PT, OT, SLP, RN, TR, Pharmacy and [place]: Medicare and Medicaid  Follow-up services arranged: Home Health: RN, PT, OT, [place], DME: 20x18 lightweight w/c, cushion via [place], Other: referred for [facility] Aide services and Patient/Family has no preference for HH/DME agencies  Comments (or additional information):  Patient/Family verbalized understanding of follow-up arrangements: Yes  Individual responsible for coordination of the follow-up plan: pt/ daughter  Confirmed correct DME delivered: Lennart Pall 10/29/2017    Aleene Swanner

## 2017-10-30 ENCOUNTER — Telehealth: Payer: Self-pay | Admitting: Registered Nurse

## 2017-10-30 NOTE — Telephone Encounter (Signed)
Transitional Care call Transitional Care Call Completed  Patient name: Chelsea [name] DOB: 1950/10/30 1. Are you/is patient experiencing any problems since coming home? No a. Are there any questions regarding any aspect of care? No 2. Are there any questions regarding medications administration/dosing? No a. Are meds being taken as prescribed? Yes b. "Patient should review meds with caller to confirm" Medication List Reviewed 3. Have there been any falls? No 4. Has Home Health been to the house and/or have they contacted you? No: [facility] will call within 48 hours. Ms. Isip instructed to call office if they don't here for [place] by 10/31/2017, she verbalize understanding. a. If not, have you tried to contact them? No b. Can we help you contact them? No 5. Are bowels and bladder emptying properly? Yes a. Are there any unexpected incontinence issues? No b. If applicable, is patient following bowel/bladder programs? NA 6. Any fevers, problems with breathing, unexpected pain? No 7. Are there any skin problems or new areas of breakdown? No 8. Has the patient/family member arranged specialty MD follow up (ie cardiology/neurology/renal/surgical/etc.)?  Ms. Mcneil daughter will be calling [facility] today for an appointment she reports.  a. Can we help arrange? No 9. Does the patient need any other services or support that we can help arrange? No 10. Are caregivers following through as expected in assisting the patient? Yes 11. Has the patient quit smoking, drinking alcohol, or using drugs as recommended? Ms. Depinto reports she doesn't smoke, drink alcohol or use illicit drugs.   Appointment date/time 11/06/2017, arrival time 12:40 for 1:00 appointment. At [place]

## 2017-10-30 NOTE — Telephone Encounter (Signed)
error 

## 2017-11-01 ENCOUNTER — Telehealth: Payer: Self-pay | Admitting: Internal Medicine

## 2017-11-01 DIAGNOSIS — E669 Obesity, unspecified: Secondary | ICD-10-CM | POA: Diagnosis not present

## 2017-11-01 DIAGNOSIS — E1122 Type 2 diabetes mellitus with diabetic chronic kidney disease: Secondary | ICD-10-CM | POA: Diagnosis not present

## 2017-11-01 DIAGNOSIS — I129 Hypertensive chronic kidney disease with stage 1 through stage 4 chronic kidney disease, or unspecified chronic kidney disease: Secondary | ICD-10-CM | POA: Diagnosis not present

## 2017-11-01 DIAGNOSIS — N189 Chronic kidney disease, unspecified: Secondary | ICD-10-CM | POA: Diagnosis not present

## 2017-11-01 DIAGNOSIS — S92412D Displaced fracture of proximal phalanx of left great toe, subsequent encounter for fracture with routine healing: Secondary | ICD-10-CM | POA: Diagnosis not present

## 2017-11-01 DIAGNOSIS — Z7982 Long term (current) use of aspirin: Secondary | ICD-10-CM | POA: Diagnosis not present

## 2017-11-01 DIAGNOSIS — E11319 Type 2 diabetes mellitus with unspecified diabetic retinopathy without macular edema: Secondary | ICD-10-CM | POA: Diagnosis not present

## 2017-11-01 DIAGNOSIS — Z9181 History of falling: Secondary | ICD-10-CM | POA: Diagnosis not present

## 2017-11-01 DIAGNOSIS — E785 Hyperlipidemia, unspecified: Secondary | ICD-10-CM | POA: Diagnosis not present

## 2017-11-01 DIAGNOSIS — M6281 Muscle weakness (generalized): Secondary | ICD-10-CM | POA: Diagnosis not present

## 2017-11-01 DIAGNOSIS — Z794 Long term (current) use of insulin: Secondary | ICD-10-CM | POA: Diagnosis not present

## 2017-11-01 DIAGNOSIS — I69398 Other sequelae of cerebral infarction: Secondary | ICD-10-CM | POA: Diagnosis not present

## 2017-11-01 NOTE — Telephone Encounter (Signed)
Verbal Chelsea [name] given to [place] for [facility] Skilled nursing twice weekly for 3 weeks and once weekly for 5 weeks for wound care. States she already has wound orders from Psychologist, sport and exercise. Will route to PCP for agreement/denial. Hubbard Hartshorn, RN, BSN

## 2017-11-01 NOTE — Telephone Encounter (Signed)
I agree with the referral for [facility] skilled nursing. Thanks!

## 2017-11-01 NOTE — Telephone Encounter (Signed)
[facility] care [phone]; verbal orderd

## 2017-11-02 DIAGNOSIS — E11319 Type 2 diabetes mellitus with unspecified diabetic retinopathy without macular edema: Secondary | ICD-10-CM | POA: Diagnosis not present

## 2017-11-02 DIAGNOSIS — I129 Hypertensive chronic kidney disease with stage 1 through stage 4 chronic kidney disease, or unspecified chronic kidney disease: Secondary | ICD-10-CM | POA: Diagnosis not present

## 2017-11-02 DIAGNOSIS — E1122 Type 2 diabetes mellitus with diabetic chronic kidney disease: Secondary | ICD-10-CM | POA: Diagnosis not present

## 2017-11-02 DIAGNOSIS — I69398 Other sequelae of cerebral infarction: Secondary | ICD-10-CM | POA: Diagnosis not present

## 2017-11-02 DIAGNOSIS — S92412D Displaced fracture of proximal phalanx of left great toe, subsequent encounter for fracture with routine healing: Secondary | ICD-10-CM | POA: Diagnosis not present

## 2017-11-02 DIAGNOSIS — M6281 Muscle weakness (generalized): Secondary | ICD-10-CM | POA: Diagnosis not present

## 2017-11-04 DIAGNOSIS — I129 Hypertensive chronic kidney disease with stage 1 through stage 4 chronic kidney disease, or unspecified chronic kidney disease: Secondary | ICD-10-CM | POA: Diagnosis not present

## 2017-11-04 DIAGNOSIS — S92412D Displaced fracture of proximal phalanx of left great toe, subsequent encounter for fracture with routine healing: Secondary | ICD-10-CM | POA: Diagnosis not present

## 2017-11-04 DIAGNOSIS — E1122 Type 2 diabetes mellitus with diabetic chronic kidney disease: Secondary | ICD-10-CM | POA: Diagnosis not present

## 2017-11-04 DIAGNOSIS — M6281 Muscle weakness (generalized): Secondary | ICD-10-CM | POA: Diagnosis not present

## 2017-11-04 DIAGNOSIS — I69398 Other sequelae of cerebral infarction: Secondary | ICD-10-CM | POA: Diagnosis not present

## 2017-11-04 DIAGNOSIS — E11319 Type 2 diabetes mellitus with unspecified diabetic retinopathy without macular edema: Secondary | ICD-10-CM | POA: Diagnosis not present

## 2017-11-05 ENCOUNTER — Encounter: Payer: Self-pay | Admitting: Internal Medicine

## 2017-11-05 ENCOUNTER — Other Ambulatory Visit: Payer: Self-pay

## 2017-11-05 ENCOUNTER — Telehealth: Payer: Self-pay | Admitting: Internal Medicine

## 2017-11-05 ENCOUNTER — Ambulatory Visit (INDEPENDENT_AMBULATORY_CARE_PROVIDER_SITE_OTHER): Payer: Medicare Other | Admitting: Internal Medicine

## 2017-11-05 VITALS — BP 192/59 | HR 84 | Temp 98.2°F | Ht 64.0 in

## 2017-11-05 DIAGNOSIS — E11319 Type 2 diabetes mellitus with unspecified diabetic retinopathy without macular edema: Secondary | ICD-10-CM

## 2017-11-05 DIAGNOSIS — K117 Disturbances of salivary secretion: Secondary | ICD-10-CM

## 2017-11-05 DIAGNOSIS — I129 Hypertensive chronic kidney disease with stage 1 through stage 4 chronic kidney disease, or unspecified chronic kidney disease: Secondary | ICD-10-CM | POA: Diagnosis not present

## 2017-11-05 DIAGNOSIS — E1122 Type 2 diabetes mellitus with diabetic chronic kidney disease: Secondary | ICD-10-CM | POA: Diagnosis not present

## 2017-11-05 DIAGNOSIS — I10 Essential (primary) hypertension: Secondary | ICD-10-CM

## 2017-11-05 DIAGNOSIS — E785 Hyperlipidemia, unspecified: Secondary | ICD-10-CM

## 2017-11-05 DIAGNOSIS — Z7982 Long term (current) use of aspirin: Secondary | ICD-10-CM | POA: Diagnosis not present

## 2017-11-05 DIAGNOSIS — I69392 Facial weakness following cerebral infarction: Secondary | ICD-10-CM | POA: Diagnosis not present

## 2017-11-05 DIAGNOSIS — Z7902 Long term (current) use of antithrombotics/antiplatelets: Secondary | ICD-10-CM | POA: Diagnosis not present

## 2017-11-05 DIAGNOSIS — N189 Chronic kidney disease, unspecified: Secondary | ICD-10-CM

## 2017-11-05 DIAGNOSIS — I69398 Other sequelae of cerebral infarction: Secondary | ICD-10-CM

## 2017-11-05 DIAGNOSIS — I152 Hypertension secondary to endocrine disorders: Secondary | ICD-10-CM

## 2017-11-05 DIAGNOSIS — Z794 Long term (current) use of insulin: Secondary | ICD-10-CM

## 2017-11-05 DIAGNOSIS — E1159 Type 2 diabetes mellitus with other circulatory complications: Secondary | ICD-10-CM

## 2017-11-05 DIAGNOSIS — E113593 Type 2 diabetes mellitus with proliferative diabetic retinopathy without macular edema, bilateral: Secondary | ICD-10-CM

## 2017-11-05 DIAGNOSIS — Z79899 Other long term (current) drug therapy: Secondary | ICD-10-CM

## 2017-11-05 DIAGNOSIS — I6340 Cerebral infarction due to embolism of unspecified cerebral artery: Secondary | ICD-10-CM

## 2017-11-05 MED ORDER — METFORMIN HCL ER 500 MG PO TB24: 500.0000 mg | ORAL_TABLET | Freq: Two times a day (BID) | ORAL | 2 refills | Status: DC

## 2017-11-05 MED ORDER — RAMELTEON 8 MG PO TABS: 8.0000 mg | ORAL_TABLET | Freq: Every day | ORAL | 0 refills | Status: DC

## 2017-11-05 MED ORDER — SPIRONOLACTONE 25 MG PO TABS: 25.0000 mg | ORAL_TABLET | Freq: Every day | ORAL | 1 refills | Status: DC

## 2017-11-05 MED ORDER — VERAPAMIL HCL ER 180 MG PO TBCR: 120.0000 mg | EXTENDED_RELEASE_TABLET | Freq: Every day | ORAL | 0 refills | Status: DC

## 2017-11-05 NOTE — Assessment & Plan Note (Signed)
Patient with stable deficits of mild right facial droop and reported continued right sided weakness though on limited seated exam, strength appears intact.   Plan: --continue atorvastatin 80mg  daily --continue asa 81mg  daily; she has completed 3wks of plavix already --HH already ordered and underway; no equipment needs at this time per daughter --DM addressed as discussed elsewhere; HTN addressed as discussed elsewhere

## 2017-11-05 NOTE — Telephone Encounter (Signed)
Pam love PA with dr Naaman Plummer had ordered on disch from inpt for [facility] to do BMET on 9/19 and call results to pcp office. [facility] was unable to do that so she calls today for a new order, pt is to be seen in [facility] this pm at 1415, could BMET be done at appt today? Sending to dr's prince, svalina, amin,masoudi

## 2017-11-05 NOTE — Telephone Encounter (Signed)
Correct; her renal function has been stable so I did not order it.

## 2017-11-05 NOTE — Patient Outreach (Signed)
[facility]) Care Management  11/05/2017  ARION MORGAN [birth year] [phone]   EMMI- Stroke not on APL RED ON EMMI ALERT Day # 6 Date: 11/04/17 Red Alert Reason:  Smoked or been around smoke? Yes  Outreach attempt: No answer.  HIPAA compliant voice message left   Plan: RN CM will attempt patient again within 4 business days.    Jone Baseman, RN, MSN [facility] Care Management Care Management Coordinator Direct Line [phone] Toll Free: [phone]  Fax: [phone]

## 2017-11-05 NOTE — Patient Instructions (Signed)
For your blood pressure: Continue losartan 100mg  daily Continue hydralazine 25mg  three times a day Increase the verapimil to 180mg  daily Start spironolactone 25mg  daily  For your diabetes: Continue your insulin Start back metformin XR 500mg  twice daily
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
## 2017-11-05 NOTE — Assessment & Plan Note (Signed)
A1c in the hospital was 7.6. Mixed insulin was stopped and she was started on Lantus 22 units daily. Metformin was stopped due to chronic CKD though her GFR has remained stable at or above 45.  Plan: --continue lantus 22 units per patient preference; will not add mealtime coverage at this time --restart metformin 500mg  BID per previous dosing (did not tolerate at higher dose) as her renal function has remained stable in the January 2019.  --in future would avoid SGLT-2I as she has h/o of Fournier's gangrene; might be a candidate for GLP-1 agonist

## 2017-11-05 NOTE — Assessment & Plan Note (Signed)
Patient with at least 4 years of generally uncontrolled HTN on a wide variety of regimens. Currently she is on verapimil 120mg  daily, losartan 100mg  daily, and hydralazine 25mg  daily with poor control; she is currently asymptomatic.  Today, patient declines workup for secondary causes of hypertension.  Plan: --continue losartan 100mg  daily --continue hydralazine 25mg  TID --increase verapamil to 180mg  daily (can go up to 240mg  if needed) --start spironolactone 25mg  daily --f/u in 2 wks for BP check and Bmet; re-evaluate willingness to pursue 2ndary causes (though would not be able to test renin/aldo on spironolactone)

## 2017-11-05 NOTE — Telephone Encounter (Signed)
Ronny Bacon from [name] needs verbal order; [phone]

## 2017-11-06 ENCOUNTER — Encounter: Payer: Self-pay | Admitting: Physical Medicine & Rehabilitation

## 2017-11-06 ENCOUNTER — Telehealth: Payer: Self-pay

## 2017-11-06 ENCOUNTER — Other Ambulatory Visit: Payer: Self-pay

## 2017-11-06 ENCOUNTER — Encounter: Payer: Medicare Other | Attending: Physical Medicine & Rehabilitation | Admitting: Physical Medicine & Rehabilitation

## 2017-11-06 VITALS — BP 156/80 | HR 89 | Resp 14

## 2017-11-06 DIAGNOSIS — E785 Hyperlipidemia, unspecified: Secondary | ICD-10-CM | POA: Diagnosis not present

## 2017-11-06 DIAGNOSIS — S92912A Unspecified fracture of left toe(s), initial encounter for closed fracture: Secondary | ICD-10-CM | POA: Diagnosis not present

## 2017-11-06 DIAGNOSIS — I129 Hypertensive chronic kidney disease with stage 1 through stage 4 chronic kidney disease, or unspecified chronic kidney disease: Secondary | ICD-10-CM | POA: Diagnosis not present

## 2017-11-06 DIAGNOSIS — G894 Chronic pain syndrome: Secondary | ICD-10-CM | POA: Insufficient documentation

## 2017-11-06 DIAGNOSIS — I69398 Other sequelae of cerebral infarction: Secondary | ICD-10-CM | POA: Diagnosis not present

## 2017-11-06 DIAGNOSIS — E11319 Type 2 diabetes mellitus with unspecified diabetic retinopathy without macular edema: Secondary | ICD-10-CM | POA: Insufficient documentation

## 2017-11-06 DIAGNOSIS — E1169 Type 2 diabetes mellitus with other specified complication: Secondary | ICD-10-CM

## 2017-11-06 DIAGNOSIS — M6281 Muscle weakness (generalized): Secondary | ICD-10-CM | POA: Diagnosis not present

## 2017-11-06 DIAGNOSIS — Z794 Long term (current) use of insulin: Secondary | ICD-10-CM | POA: Diagnosis not present

## 2017-11-06 DIAGNOSIS — I639 Cerebral infarction, unspecified: Secondary | ICD-10-CM

## 2017-11-06 DIAGNOSIS — N189 Chronic kidney disease, unspecified: Secondary | ICD-10-CM | POA: Diagnosis not present

## 2017-11-06 DIAGNOSIS — E1122 Type 2 diabetes mellitus with diabetic chronic kidney disease: Secondary | ICD-10-CM | POA: Insufficient documentation

## 2017-11-06 DIAGNOSIS — S92412D Displaced fracture of proximal phalanx of left great toe, subsequent encounter for fracture with routine healing: Secondary | ICD-10-CM | POA: Diagnosis not present

## 2017-11-06 NOTE — Patient Instructions (Signed)
LEFT FOOT:  IN ABOUT A WEEK, YOU CAN TRY WEARING A REGULAR SHOE. IF YOU DON'T HAVE ANY PAIN, THEN PROCEED WITHOUT YOUR BOOT  IF YOU NOTICE PAIN RECUR, RETURN TO BOOT FOR 2 MORE WEEKS.     PLEASE FEEL FREE TO CALL OUR OFFICE WITH ANY PROBLEMS OR QUESTIONS ([phone])

## 2017-11-06 NOTE — Telephone Encounter (Signed)
Relayed dr svalina's message to [place]- [facility]
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
## 2017-11-06 NOTE — Telephone Encounter (Signed)
Ebony Hail [facility] called requesting 1wk2 for f/u cognition. Orders approved per discharge summary.

## 2017-11-06 NOTE — Patient Outreach (Signed)
[facility]) Care Management  11/06/2017  Chelsea [name] [birth year] [id number]   EMMI- Stroke not on APL RED ON EMMI ALERT Day # 6 Date:11/04/17 Red Alert Reason: Smoked or been around smoke? Yes  Outreach attempt: No answer.  HIPAA compliant voice message left   Plan: RN CM will attempt patient again within 4 business days.    Jone Baseman, RN, MSN [facility] Care Management Care Management Coordinator Direct Line [phone] Toll Free: [phone]  Fax: [phone]

## 2017-11-07 ENCOUNTER — Other Ambulatory Visit: Payer: Self-pay

## 2017-11-07 DIAGNOSIS — E1122 Type 2 diabetes mellitus with diabetic chronic kidney disease: Secondary | ICD-10-CM | POA: Diagnosis not present

## 2017-11-07 DIAGNOSIS — I129 Hypertensive chronic kidney disease with stage 1 through stage 4 chronic kidney disease, or unspecified chronic kidney disease: Secondary | ICD-10-CM | POA: Diagnosis not present

## 2017-11-07 DIAGNOSIS — S92412D Displaced fracture of proximal phalanx of left great toe, subsequent encounter for fracture with routine healing: Secondary | ICD-10-CM | POA: Diagnosis not present

## 2017-11-07 DIAGNOSIS — M6281 Muscle weakness (generalized): Secondary | ICD-10-CM | POA: Diagnosis not present

## 2017-11-07 DIAGNOSIS — I69398 Other sequelae of cerebral infarction: Secondary | ICD-10-CM | POA: Diagnosis not present

## 2017-11-07 DIAGNOSIS — E11319 Type 2 diabetes mellitus with unspecified diabetic retinopathy without macular edema: Secondary | ICD-10-CM | POA: Diagnosis not present

## 2017-11-07 NOTE — Patient Outreach (Signed)
[facility]) Care Management  11/07/2017  Chelsea [name] 1950/02/29 [id number]   EMMI-Stroke not on APL RED ON EMMI ALERT Day #6 Date:11/04/17 Red Alert Reason: Smoked or been around smoke? Yes  Outreach attempt: Spoke with patient.  She asked the reason for call.  Explained with patient reason for call and [facility] services.  Patient states she does not know who she is talking to and refused and then hung up.    Plan: RN CM will close case.    Jone Baseman, RN, MSN [place] Care Management Coordinator Direct Line [phone] Cell [phone] Toll Free: [phone]  Fax: [phone]

## 2017-11-08 DIAGNOSIS — M6281 Muscle weakness (generalized): Secondary | ICD-10-CM | POA: Diagnosis not present

## 2017-11-08 DIAGNOSIS — I129 Hypertensive chronic kidney disease with stage 1 through stage 4 chronic kidney disease, or unspecified chronic kidney disease: Secondary | ICD-10-CM | POA: Diagnosis not present

## 2017-11-08 DIAGNOSIS — S92412D Displaced fracture of proximal phalanx of left great toe, subsequent encounter for fracture with routine healing: Secondary | ICD-10-CM | POA: Diagnosis not present

## 2017-11-08 DIAGNOSIS — I69398 Other sequelae of cerebral infarction: Secondary | ICD-10-CM | POA: Diagnosis not present

## 2017-11-08 DIAGNOSIS — E11319 Type 2 diabetes mellitus with unspecified diabetic retinopathy without macular edema: Secondary | ICD-10-CM | POA: Diagnosis not present

## 2017-11-08 DIAGNOSIS — E1122 Type 2 diabetes mellitus with diabetic chronic kidney disease: Secondary | ICD-10-CM | POA: Diagnosis not present

## 2017-11-08 NOTE — Progress Notes (Signed)
Internal Medicine Clinic Attending  Case discussed with Dr. Svalina  at the time of the visit.  We reviewed the resident's history and exam and pertinent patient test results.  I agree with the assessment, diagnosis, and plan of care documented in the resident's note.  

## 2017-11-11 ENCOUNTER — Ambulatory Visit: Payer: Medicare Other

## 2017-11-11 DIAGNOSIS — Z7982 Long term (current) use of aspirin: Secondary | ICD-10-CM

## 2017-11-11 DIAGNOSIS — S92412D Displaced fracture of proximal phalanx of left great toe, subsequent encounter for fracture with routine healing: Secondary | ICD-10-CM | POA: Diagnosis not present

## 2017-11-11 DIAGNOSIS — M6281 Muscle weakness (generalized): Secondary | ICD-10-CM | POA: Diagnosis not present

## 2017-11-11 DIAGNOSIS — N189 Chronic kidney disease, unspecified: Secondary | ICD-10-CM

## 2017-11-11 DIAGNOSIS — I69398 Other sequelae of cerebral infarction: Secondary | ICD-10-CM | POA: Diagnosis not present

## 2017-11-11 DIAGNOSIS — I129 Hypertensive chronic kidney disease with stage 1 through stage 4 chronic kidney disease, or unspecified chronic kidney disease: Secondary | ICD-10-CM

## 2017-11-11 DIAGNOSIS — E11319 Type 2 diabetes mellitus with unspecified diabetic retinopathy without macular edema: Secondary | ICD-10-CM | POA: Diagnosis not present

## 2017-11-11 DIAGNOSIS — Z794 Long term (current) use of insulin: Secondary | ICD-10-CM

## 2017-11-11 DIAGNOSIS — E669 Obesity, unspecified: Secondary | ICD-10-CM

## 2017-11-11 DIAGNOSIS — Z9181 History of falling: Secondary | ICD-10-CM

## 2017-11-11 DIAGNOSIS — E785 Hyperlipidemia, unspecified: Secondary | ICD-10-CM

## 2017-11-11 DIAGNOSIS — E1122 Type 2 diabetes mellitus with diabetic chronic kidney disease: Secondary | ICD-10-CM

## 2017-11-12 DIAGNOSIS — S92412D Displaced fracture of proximal phalanx of left great toe, subsequent encounter for fracture with routine healing: Secondary | ICD-10-CM | POA: Diagnosis not present

## 2017-11-12 DIAGNOSIS — M6281 Muscle weakness (generalized): Secondary | ICD-10-CM | POA: Diagnosis not present

## 2017-11-12 DIAGNOSIS — E11319 Type 2 diabetes mellitus with unspecified diabetic retinopathy without macular edema: Secondary | ICD-10-CM | POA: Diagnosis not present

## 2017-11-12 DIAGNOSIS — I129 Hypertensive chronic kidney disease with stage 1 through stage 4 chronic kidney disease, or unspecified chronic kidney disease: Secondary | ICD-10-CM | POA: Diagnosis not present

## 2017-11-12 DIAGNOSIS — E1122 Type 2 diabetes mellitus with diabetic chronic kidney disease: Secondary | ICD-10-CM | POA: Diagnosis not present

## 2017-11-12 DIAGNOSIS — I69398 Other sequelae of cerebral infarction: Secondary | ICD-10-CM | POA: Diagnosis not present

## 2017-11-13 ENCOUNTER — Telehealth: Payer: Self-pay | Admitting: *Deleted

## 2017-11-13 DIAGNOSIS — M6281 Muscle weakness (generalized): Secondary | ICD-10-CM | POA: Diagnosis not present

## 2017-11-13 DIAGNOSIS — I69398 Other sequelae of cerebral infarction: Secondary | ICD-10-CM | POA: Diagnosis not present

## 2017-11-13 DIAGNOSIS — E1122 Type 2 diabetes mellitus with diabetic chronic kidney disease: Secondary | ICD-10-CM | POA: Diagnosis not present

## 2017-11-13 DIAGNOSIS — S92412D Displaced fracture of proximal phalanx of left great toe, subsequent encounter for fracture with routine healing: Secondary | ICD-10-CM | POA: Diagnosis not present

## 2017-11-13 DIAGNOSIS — E11319 Type 2 diabetes mellitus with unspecified diabetic retinopathy without macular edema: Secondary | ICD-10-CM | POA: Diagnosis not present

## 2017-11-13 DIAGNOSIS — I129 Hypertensive chronic kidney disease with stage 1 through stage 4 chronic kidney disease, or unspecified chronic kidney disease: Secondary | ICD-10-CM | POA: Diagnosis not present

## 2017-11-13 NOTE — Telephone Encounter (Signed)
Information was sent to [employer] for PA for Ramelteon.  Awaiting decision for PA.  Rx [id number]  Sander Nephew, RN 11/13/2017 2:40 PM.

## 2017-11-14 DIAGNOSIS — E11319 Type 2 diabetes mellitus with unspecified diabetic retinopathy without macular edema: Secondary | ICD-10-CM | POA: Diagnosis not present

## 2017-11-14 DIAGNOSIS — I129 Hypertensive chronic kidney disease with stage 1 through stage 4 chronic kidney disease, or unspecified chronic kidney disease: Secondary | ICD-10-CM | POA: Diagnosis not present

## 2017-11-14 DIAGNOSIS — E1122 Type 2 diabetes mellitus with diabetic chronic kidney disease: Secondary | ICD-10-CM | POA: Diagnosis not present

## 2017-11-14 DIAGNOSIS — I69398 Other sequelae of cerebral infarction: Secondary | ICD-10-CM | POA: Diagnosis not present

## 2017-11-14 DIAGNOSIS — S92412D Displaced fracture of proximal phalanx of left great toe, subsequent encounter for fracture with routine healing: Secondary | ICD-10-CM | POA: Diagnosis not present

## 2017-11-14 DIAGNOSIS — M6281 Muscle weakness (generalized): Secondary | ICD-10-CM | POA: Diagnosis not present

## 2017-11-15 DIAGNOSIS — I69398 Other sequelae of cerebral infarction: Secondary | ICD-10-CM | POA: Diagnosis not present

## 2017-11-15 DIAGNOSIS — S92412D Displaced fracture of proximal phalanx of left great toe, subsequent encounter for fracture with routine healing: Secondary | ICD-10-CM | POA: Diagnosis not present

## 2017-11-15 DIAGNOSIS — E11319 Type 2 diabetes mellitus with unspecified diabetic retinopathy without macular edema: Secondary | ICD-10-CM | POA: Diagnosis not present

## 2017-11-15 DIAGNOSIS — E1122 Type 2 diabetes mellitus with diabetic chronic kidney disease: Secondary | ICD-10-CM | POA: Diagnosis not present

## 2017-11-15 DIAGNOSIS — I129 Hypertensive chronic kidney disease with stage 1 through stage 4 chronic kidney disease, or unspecified chronic kidney disease: Secondary | ICD-10-CM | POA: Diagnosis not present

## 2017-11-15 DIAGNOSIS — M6281 Muscle weakness (generalized): Secondary | ICD-10-CM | POA: Diagnosis not present

## 2017-11-18 ENCOUNTER — Telehealth: Payer: Self-pay

## 2017-11-18 DIAGNOSIS — I69398 Other sequelae of cerebral infarction: Secondary | ICD-10-CM | POA: Diagnosis not present

## 2017-11-18 DIAGNOSIS — E1122 Type 2 diabetes mellitus with diabetic chronic kidney disease: Secondary | ICD-10-CM | POA: Diagnosis not present

## 2017-11-18 DIAGNOSIS — E11319 Type 2 diabetes mellitus with unspecified diabetic retinopathy without macular edema: Secondary | ICD-10-CM | POA: Diagnosis not present

## 2017-11-18 DIAGNOSIS — S92412D Displaced fracture of proximal phalanx of left great toe, subsequent encounter for fracture with routine healing: Secondary | ICD-10-CM | POA: Diagnosis not present

## 2017-11-18 DIAGNOSIS — M6281 Muscle weakness (generalized): Secondary | ICD-10-CM | POA: Diagnosis not present

## 2017-11-18 DIAGNOSIS — I129 Hypertensive chronic kidney disease with stage 1 through stage 4 chronic kidney disease, or unspecified chronic kidney disease: Secondary | ICD-10-CM | POA: Diagnosis not present

## 2017-11-18 NOTE — Telephone Encounter (Signed)
Chelsea [name], PTA from [facility] called to report a fall. Pt told him that she slide off the bed on Saturday.

## 2017-11-18 NOTE — Telephone Encounter (Signed)
Manuela Schwartz, OT/[facility] called requesting verbal orders for [facility] 1wk2, 2wk3, 1wk3. Orders approved per last note 11-06-17 of Dr. Naaman Plummer to continue [facility] Therapies.

## 2017-11-19 ENCOUNTER — Encounter: Payer: Self-pay | Admitting: Internal Medicine

## 2017-11-19 ENCOUNTER — Ambulatory Visit (INDEPENDENT_AMBULATORY_CARE_PROVIDER_SITE_OTHER): Payer: Medicare Other | Admitting: Internal Medicine

## 2017-11-19 ENCOUNTER — Other Ambulatory Visit: Payer: Self-pay

## 2017-11-19 ENCOUNTER — Telehealth: Payer: Self-pay | Admitting: *Deleted

## 2017-11-19 VITALS — BP 175/66 | HR 90 | Temp 97.9°F | Ht 65.0 in | Wt 230.0 lb

## 2017-11-19 DIAGNOSIS — I10 Essential (primary) hypertension: Secondary | ICD-10-CM | POA: Diagnosis not present

## 2017-11-19 DIAGNOSIS — E11319 Type 2 diabetes mellitus with unspecified diabetic retinopathy without macular edema: Secondary | ICD-10-CM

## 2017-11-19 DIAGNOSIS — Z79899 Other long term (current) drug therapy: Secondary | ICD-10-CM

## 2017-11-19 DIAGNOSIS — Z7982 Long term (current) use of aspirin: Secondary | ICD-10-CM

## 2017-11-19 DIAGNOSIS — I129 Hypertensive chronic kidney disease with stage 1 through stage 4 chronic kidney disease, or unspecified chronic kidney disease: Secondary | ICD-10-CM | POA: Diagnosis not present

## 2017-11-19 DIAGNOSIS — E113593 Type 2 diabetes mellitus with proliferative diabetic retinopathy without macular edema, bilateral: Secondary | ICD-10-CM

## 2017-11-19 DIAGNOSIS — E1122 Type 2 diabetes mellitus with diabetic chronic kidney disease: Secondary | ICD-10-CM

## 2017-11-19 DIAGNOSIS — E1159 Type 2 diabetes mellitus with other circulatory complications: Secondary | ICD-10-CM | POA: Diagnosis not present

## 2017-11-19 DIAGNOSIS — Z794 Long term (current) use of insulin: Secondary | ICD-10-CM

## 2017-11-19 DIAGNOSIS — N183 Chronic kidney disease, stage 3 unspecified: Secondary | ICD-10-CM

## 2017-11-19 DIAGNOSIS — I152 Hypertension secondary to endocrine disorders: Secondary | ICD-10-CM

## 2017-11-19 MED ORDER — METFORMIN HCL ER 500 MG PO TB24: 500.0000 mg | ORAL_TABLET | Freq: Every day | ORAL | 2 refills | Status: DC

## 2017-11-19 MED ORDER — VERAPAMIL HCL ER 240 MG PO TBCR: 240.0000 mg | EXTENDED_RELEASE_TABLET | Freq: Every day | ORAL | 1 refills | Status: DC

## 2017-11-19 MED ORDER — SPIRONOLACTONE 25 MG PO TABS: 25.0000 mg | ORAL_TABLET | Freq: Every day | ORAL | 1 refills | Status: DC

## 2017-11-19 MED ORDER — INSULIN GLARGINE 100 UNIT/ML SOLOSTAR PEN: 22.0000 [IU] | PEN_INJECTOR | Freq: Every day | SUBCUTANEOUS | 4 refills | Status: DC

## 2017-11-19 MED ORDER — FLUTICASONE PROPIONATE 50 MCG/ACT NA SUSP: 2.0000 | Freq: Every day | NASAL | 2 refills | Status: DC

## 2017-11-19 NOTE — Assessment & Plan Note (Addendum)
Diabetes: Patient requesting refill of Lantus today. Will refill for 22U QHS as previously ordered.   Addendum: Called patient today and notified her of the results of her labs and that we would not be making any medication changes. She stated understanding. She was however, concerned as she was now out of insulin. Upon calling the pharmacy it appears that she was given a 68 day supply on on 10/29/2017 or 5 pens, where each should last ~12-13 days at 22U nightly indicating that she should have approximately three pens remaining, which she states she does not. I will attempt to contact her again to inquire. We may need to supply a one time IM program refill of her Lantus to the [facility] to avoid potential DKA.

## 2017-11-19 NOTE — Telephone Encounter (Signed)
[facility] called to say they missed a visit w/ pt today because she was at an appt with her pcp, they will resume the scheduled visits, ask her if she would like to know about any changes made she states the pt will inform her

## 2017-11-19 NOTE — Patient Instructions (Signed)
FOLLOW-UP INSTRUCTIONS When: 4-6 wks, please stop and schedule with your doctor for a follow-up appointment For: Routine visit and BP check What to bring: All of your medications  I have increased your verapamil to 240mg  daily today. If you have any questions please feel free to call us at any time.  With any medication change there is the potential for side effects to occur. Symptoms including lightheadedness, dizziness, leg swelling, should not be ignored and should encourage you to contact us or visit the ED if we are unavailable and the symptoms are severe.  Thank you for your visit to the [facility] today.

## 2017-11-19 NOTE — Assessment & Plan Note (Addendum)
HTN: Patient advised to return due to severe HTN on most recent visit on 09/24. She was started on spironolactone 25mg  daily and her verapamil was increased to 180mg  daily in conjunction with continuing her Losartan 100mg  and Hydralazine 25mg  TID. Although the verapamil script was written for 1/2 a 180mg  tablet daily she was taking 180mg  daily. She denied nausea, vomting, headache, visual changes, weakness, palpitations or chest pain and endorses no symptoms. Given the BP of 179/71 today, we will increase her verapamil to 240mg  daily.  Plan: Ordered BMP today.  Increased verapamil to 240 mg daily Would consider increasing spironolactone vs Hydralazine on next visit pending results of her BMP tdoay
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
## 2017-11-20 ENCOUNTER — Telehealth: Payer: Self-pay

## 2017-11-20 DIAGNOSIS — M6281 Muscle weakness (generalized): Secondary | ICD-10-CM | POA: Diagnosis not present

## 2017-11-20 DIAGNOSIS — E11319 Type 2 diabetes mellitus with unspecified diabetic retinopathy without macular edema: Secondary | ICD-10-CM | POA: Diagnosis not present

## 2017-11-20 DIAGNOSIS — E1122 Type 2 diabetes mellitus with diabetic chronic kidney disease: Secondary | ICD-10-CM | POA: Diagnosis not present

## 2017-11-20 DIAGNOSIS — I69398 Other sequelae of cerebral infarction: Secondary | ICD-10-CM | POA: Diagnosis not present

## 2017-11-20 DIAGNOSIS — S92412D Displaced fracture of proximal phalanx of left great toe, subsequent encounter for fracture with routine healing: Secondary | ICD-10-CM | POA: Diagnosis not present

## 2017-11-20 DIAGNOSIS — I129 Hypertensive chronic kidney disease with stage 1 through stage 4 chronic kidney disease, or unspecified chronic kidney disease: Secondary | ICD-10-CM | POA: Diagnosis not present

## 2017-11-20 LAB — BMP8+ANION GAP
Anion Gap: 19 mmol/L — ABNORMAL HIGH (ref 10.0–18.0)
BUN / CREAT RATIO: 15 (ref 12–28)
BUN: 19 mg/dL (ref 8–27)
CO2: 21 mmol/L (ref 20–29)
Calcium: 8.7 mg/dL (ref 8.7–10.3)
Chloride: 103 mmol/L (ref 96–106)
Creatinine, Ser: 1.29 mg/dL — ABNORMAL HIGH (ref 0.57–1.00)
GFR calc non Af Amer: 43 mL/min/{1.73_m2} — ABNORMAL LOW (ref >59)
GFR, EST AFRICAN AMERICAN: 49 mL/min/{1.73_m2} — AB (ref >59)
Glucose: 161 mg/dL — ABNORMAL HIGH (ref 65–99)
Potassium: 4.5 mmol/L (ref 3.5–5.2)
SODIUM: 143 mmol/L (ref 134–144)

## 2017-11-20 NOTE — Telephone Encounter (Signed)
Insulin Glargine (LANTUS) 100 UNIT/ML Solostar Pen, refill request @  [address] [place] - [place], [place] - [address] [phone] (Phone) [phone] (Fax)

## 2017-11-20 NOTE — Telephone Encounter (Signed)
This was sent 10/8

## 2017-11-20 NOTE — Assessment & Plan Note (Signed)
Stable, slightly improved Scr to 1.29 as below BMP Latest Ref Rng & Units 11/19/2017 10/29/2017 10/28/2017  Glucose 65 - 99 mg/dL 161(H) 155(H) 306(H)  BUN 8 - 27 mg/dL 19 35(H) 33(H)  Creatinine 0.57 - 1.00 mg/dL 1.29(H) 1.34(H) 1.35(H)  BUN/Creat Ratio 12 - 28 15 - -  Sodium 134 - 144 mmol/L 143 141 140  Potassium 3.5 - 5.2 mmol/L 4.5 4.0 4.4  Chloride 96 - 106 mmol/L 103 104 99  CO2 20 - 29 mmol/L 21 28 30   Calcium 8.7 - 10.3 mg/dL 8.7 8.6(L) 9.1

## 2017-11-21 ENCOUNTER — Telehealth: Payer: Self-pay | Admitting: *Deleted

## 2017-11-21 ENCOUNTER — Encounter: Payer: Self-pay | Admitting: *Deleted

## 2017-11-21 DIAGNOSIS — E11319 Type 2 diabetes mellitus with unspecified diabetic retinopathy without macular edema: Secondary | ICD-10-CM | POA: Diagnosis not present

## 2017-11-21 DIAGNOSIS — S92412D Displaced fracture of proximal phalanx of left great toe, subsequent encounter for fracture with routine healing: Secondary | ICD-10-CM | POA: Diagnosis not present

## 2017-11-21 DIAGNOSIS — I69398 Other sequelae of cerebral infarction: Secondary | ICD-10-CM | POA: Diagnosis not present

## 2017-11-21 DIAGNOSIS — I129 Hypertensive chronic kidney disease with stage 1 through stage 4 chronic kidney disease, or unspecified chronic kidney disease: Secondary | ICD-10-CM | POA: Diagnosis not present

## 2017-11-21 DIAGNOSIS — M6281 Muscle weakness (generalized): Secondary | ICD-10-CM | POA: Diagnosis not present

## 2017-11-21 DIAGNOSIS — E1122 Type 2 diabetes mellitus with diabetic chronic kidney disease: Secondary | ICD-10-CM | POA: Diagnosis not present

## 2017-11-21 NOTE — Telephone Encounter (Signed)
Pt stated she will be out of Lantus insulin; cannot get a refill until Nov. But she has Novolog 70/30 in the refrigerator( unopen x 30 days) wants to know if she can use it until she get lantus refill. [name] Plyler is with a pt -  stated she will call Mrs Guile back.

## 2017-11-21 NOTE — Telephone Encounter (Signed)
[name] thanks for your help.

## 2017-11-21 NOTE — Telephone Encounter (Signed)
Ms. Olthoff says she has a box that her pens came in , but it is empty. Daughter has been giving her her shots because she cannot see since her stroke. On her last pen now, cannot see well. I confirmed that she takes one shot per day 22 units.  Spoke to pharmacy and Dr. Daryll Drown because we do not have samples of lantus. Will have hr substitute Tresiba insulin 22 units daily for lantus insulin 22 units daily per their suggestion. Patient called and notified to come pick up the sample of insulin.

## 2017-11-21 NOTE — Telephone Encounter (Signed)
No answer at Chelsea [name] phone number.   I left her a message to not restart her Novolog mix 70/30. Called [facility]- they say they dispensed a  68 day supply on sept 17th (box 5 pens 15 mL) their directions are to inject 22 units daily.

## 2017-11-22 DIAGNOSIS — E11319 Type 2 diabetes mellitus with unspecified diabetic retinopathy without macular edema: Secondary | ICD-10-CM | POA: Diagnosis not present

## 2017-11-22 DIAGNOSIS — I69398 Other sequelae of cerebral infarction: Secondary | ICD-10-CM | POA: Diagnosis not present

## 2017-11-22 DIAGNOSIS — E1122 Type 2 diabetes mellitus with diabetic chronic kidney disease: Secondary | ICD-10-CM | POA: Diagnosis not present

## 2017-11-22 DIAGNOSIS — I129 Hypertensive chronic kidney disease with stage 1 through stage 4 chronic kidney disease, or unspecified chronic kidney disease: Secondary | ICD-10-CM | POA: Diagnosis not present

## 2017-11-22 DIAGNOSIS — M6281 Muscle weakness (generalized): Secondary | ICD-10-CM | POA: Diagnosis not present

## 2017-11-22 DIAGNOSIS — S92412D Displaced fracture of proximal phalanx of left great toe, subsequent encounter for fracture with routine healing: Secondary | ICD-10-CM | POA: Diagnosis not present

## 2017-11-22 NOTE — Progress Notes (Signed)
Internal Medicine Clinic Attending  Case discussed with Dr. Harbrecht at the time of the visit.  We reviewed the resident's history and exam and pertinent patient test results.  I agree with the assessment, diagnosis, and plan of care documented in the resident's note.   

## 2017-11-25 ENCOUNTER — Other Ambulatory Visit: Payer: Self-pay | Admitting: Internal Medicine

## 2017-11-25 ENCOUNTER — Other Ambulatory Visit: Payer: Self-pay | Admitting: *Deleted

## 2017-11-25 MED ORDER — CITALOPRAM HYDROBROMIDE 10 MG PO TABS: 10.0000 mg | ORAL_TABLET | Freq: Every day | ORAL | 2 refills | Status: DC

## 2017-11-26 NOTE — Telephone Encounter (Signed)
Next appt scheduled 11/13 with PCP.

## 2017-11-26 NOTE — Telephone Encounter (Signed)
citalopram (CELEXA) 10 MG tablet,  hydrALAZINE (APRESOLINE) 25 MG tablet,  spironolactone (ALDACTONE) 25 MG tablet,  verapamil (CALAN-SR) 240 MG CR tablet, REFILL REQUEST @  [address] [place] - [place], [place] - [address] [phone] (Phone) [phone] (Fax)

## 2017-11-27 ENCOUNTER — Other Ambulatory Visit: Payer: Self-pay | Admitting: Internal Medicine

## 2017-11-27 ENCOUNTER — Telehealth: Payer: Self-pay | Admitting: *Deleted

## 2017-11-27 DIAGNOSIS — I129 Hypertensive chronic kidney disease with stage 1 through stage 4 chronic kidney disease, or unspecified chronic kidney disease: Secondary | ICD-10-CM | POA: Diagnosis not present

## 2017-11-27 DIAGNOSIS — I69398 Other sequelae of cerebral infarction: Secondary | ICD-10-CM | POA: Diagnosis not present

## 2017-11-27 DIAGNOSIS — E11319 Type 2 diabetes mellitus with unspecified diabetic retinopathy without macular edema: Secondary | ICD-10-CM | POA: Diagnosis not present

## 2017-11-27 DIAGNOSIS — E1122 Type 2 diabetes mellitus with diabetic chronic kidney disease: Secondary | ICD-10-CM | POA: Diagnosis not present

## 2017-11-27 DIAGNOSIS — M6281 Muscle weakness (generalized): Secondary | ICD-10-CM | POA: Diagnosis not present

## 2017-11-27 DIAGNOSIS — S92412D Displaced fracture of proximal phalanx of left great toe, subsequent encounter for fracture with routine healing: Secondary | ICD-10-CM | POA: Diagnosis not present

## 2017-11-27 NOTE — Telephone Encounter (Signed)
Needs refills on hydrALAZINE (APRESOLINE) 25 MG tablet at [facility]  ;pt contact [phone]

## 2017-11-27 NOTE — Telephone Encounter (Signed)
Pt states she needs meds to be filled by today. Please call back.

## 2017-11-27 NOTE — Telephone Encounter (Addendum)
Call to [facility] concerning appeal for Ramelton 8 mg tablets.  Spoke with representative there.  Will need to fax additional information.  Will fax information to [facility] for consideration.   Sander Nephew, RN 11/27/2017 4:10 PM.  Call from [facility] for further questions about referral. Information was given Sander Nephew, RN 11/28/2017 2:15 PM.  Fax from [facility] PA for Ramelteon 8 mg has been approved 11/28/2017 thru 02/12/2019.  Sander Nephew, RN 11/29/2017 10:53 AM

## 2017-11-28 MED ORDER — HYDRALAZINE HCL 25 MG PO TABS: 25.0000 mg | ORAL_TABLET | Freq: Three times a day (TID) | ORAL | 2 refills | Status: DC

## 2017-11-28 NOTE — Telephone Encounter (Signed)
Pt calling regarding her medicine, she called pharmacy nothing there. Pls contact [phone].

## 2017-11-28 NOTE — Telephone Encounter (Addendum)
Pt stated she received all of her refills except Hydralazine.

## 2017-11-29 DIAGNOSIS — M6281 Muscle weakness (generalized): Secondary | ICD-10-CM | POA: Diagnosis not present

## 2017-11-29 DIAGNOSIS — I69398 Other sequelae of cerebral infarction: Secondary | ICD-10-CM | POA: Diagnosis not present

## 2017-11-29 DIAGNOSIS — S92412D Displaced fracture of proximal phalanx of left great toe, subsequent encounter for fracture with routine healing: Secondary | ICD-10-CM | POA: Diagnosis not present

## 2017-11-29 DIAGNOSIS — I129 Hypertensive chronic kidney disease with stage 1 through stage 4 chronic kidney disease, or unspecified chronic kidney disease: Secondary | ICD-10-CM | POA: Diagnosis not present

## 2017-11-29 DIAGNOSIS — E11319 Type 2 diabetes mellitus with unspecified diabetic retinopathy without macular edema: Secondary | ICD-10-CM | POA: Diagnosis not present

## 2017-11-29 DIAGNOSIS — E1122 Type 2 diabetes mellitus with diabetic chronic kidney disease: Secondary | ICD-10-CM | POA: Diagnosis not present

## 2017-12-02 ENCOUNTER — Telehealth: Payer: Self-pay

## 2017-12-02 ENCOUNTER — Ambulatory Visit: Payer: Medicare Other | Admitting: Adult Health

## 2017-12-02 NOTE — Telephone Encounter (Signed)
Patient no show for appt today. 

## 2017-12-03 ENCOUNTER — Encounter: Payer: Self-pay | Admitting: Adult Health

## 2017-12-04 ENCOUNTER — Encounter: Payer: Self-pay | Admitting: Adult Health

## 2017-12-04 ENCOUNTER — Ambulatory Visit (INDEPENDENT_AMBULATORY_CARE_PROVIDER_SITE_OTHER): Payer: Medicare Other | Admitting: Adult Health

## 2017-12-04 VITALS — BP 145/50 | HR 86 | Ht 64.0 in | Wt 233.6 lb

## 2017-12-04 DIAGNOSIS — E785 Hyperlipidemia, unspecified: Secondary | ICD-10-CM | POA: Diagnosis not present

## 2017-12-04 DIAGNOSIS — E113593 Type 2 diabetes mellitus with proliferative diabetic retinopathy without macular edema, bilateral: Secondary | ICD-10-CM

## 2017-12-04 DIAGNOSIS — E1159 Type 2 diabetes mellitus with other circulatory complications: Secondary | ICD-10-CM | POA: Diagnosis not present

## 2017-12-04 DIAGNOSIS — I10 Essential (primary) hypertension: Secondary | ICD-10-CM | POA: Diagnosis not present

## 2017-12-04 DIAGNOSIS — I63412 Cerebral infarction due to embolism of left middle cerebral artery: Secondary | ICD-10-CM | POA: Diagnosis not present

## 2017-12-04 DIAGNOSIS — E1169 Type 2 diabetes mellitus with other specified complication: Secondary | ICD-10-CM

## 2017-12-04 DIAGNOSIS — I152 Hypertension secondary to endocrine disorders: Secondary | ICD-10-CM

## 2017-12-04 DIAGNOSIS — Z794 Long term (current) use of insulin: Secondary | ICD-10-CM | POA: Diagnosis not present

## 2017-12-04 NOTE — Progress Notes (Signed)
I agree with the above plan 
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
## 2017-12-04 NOTE — Patient Instructions (Signed)
Continue aspirin 81 mg daily  and lipitor 80mg   for secondary stroke prevention  Continue to follow up with PCP regarding cholesterol, blood pressure and diabetes management - please ensure your cholesterol levels are trending down towards a goal of less than 70.  30 day cardiac monitor to rule out atrial fibrillation - you will be called to schedule appointment - if this is negative, we will have to discuss possible placement of loop recorder while will monitor your heart for 3 years   Continue therapies for continued weakness  Continue to monitor blood pressure at home  Maintain strict control of hypertension with blood pressure goal below 130/90, diabetes with hemoglobin A1c goal below 6.5% and cholesterol with LDL cholesterol (bad cholesterol) goal below 70 mg/dL. I also advised the patient to eat a healthy diet with plenty of whole grains, cereals, fruits and vegetables, exercise regularly and maintain ideal body weight.  Followup in the future with me in 3 months or call earlier if needed       Thank you for coming to see [place] at [facility]. I hope we have been able to provide you high quality care today.  You may receive a patient satisfaction survey over the next few weeks. We would appreciate your feedback and comments so that we may continue to improve ourselves and the health of our patients.

## 2017-12-05 DIAGNOSIS — E11319 Type 2 diabetes mellitus with unspecified diabetic retinopathy without macular edema: Secondary | ICD-10-CM | POA: Diagnosis not present

## 2017-12-05 DIAGNOSIS — M6281 Muscle weakness (generalized): Secondary | ICD-10-CM | POA: Diagnosis not present

## 2017-12-05 DIAGNOSIS — I69398 Other sequelae of cerebral infarction: Secondary | ICD-10-CM | POA: Diagnosis not present

## 2017-12-05 DIAGNOSIS — S92412D Displaced fracture of proximal phalanx of left great toe, subsequent encounter for fracture with routine healing: Secondary | ICD-10-CM | POA: Diagnosis not present

## 2017-12-05 DIAGNOSIS — E1122 Type 2 diabetes mellitus with diabetic chronic kidney disease: Secondary | ICD-10-CM | POA: Diagnosis not present

## 2017-12-05 DIAGNOSIS — I129 Hypertensive chronic kidney disease with stage 1 through stage 4 chronic kidney disease, or unspecified chronic kidney disease: Secondary | ICD-10-CM | POA: Diagnosis not present

## 2017-12-06 DIAGNOSIS — I129 Hypertensive chronic kidney disease with stage 1 through stage 4 chronic kidney disease, or unspecified chronic kidney disease: Secondary | ICD-10-CM | POA: Diagnosis not present

## 2017-12-06 DIAGNOSIS — E11319 Type 2 diabetes mellitus with unspecified diabetic retinopathy without macular edema: Secondary | ICD-10-CM | POA: Diagnosis not present

## 2017-12-06 DIAGNOSIS — S92412D Displaced fracture of proximal phalanx of left great toe, subsequent encounter for fracture with routine healing: Secondary | ICD-10-CM | POA: Diagnosis not present

## 2017-12-06 DIAGNOSIS — E1122 Type 2 diabetes mellitus with diabetic chronic kidney disease: Secondary | ICD-10-CM | POA: Diagnosis not present

## 2017-12-06 DIAGNOSIS — I69398 Other sequelae of cerebral infarction: Secondary | ICD-10-CM | POA: Diagnosis not present

## 2017-12-06 DIAGNOSIS — M6281 Muscle weakness (generalized): Secondary | ICD-10-CM | POA: Diagnosis not present

## 2017-12-09 DIAGNOSIS — I69398 Other sequelae of cerebral infarction: Secondary | ICD-10-CM | POA: Diagnosis not present

## 2017-12-09 DIAGNOSIS — S92412D Displaced fracture of proximal phalanx of left great toe, subsequent encounter for fracture with routine healing: Secondary | ICD-10-CM | POA: Diagnosis not present

## 2017-12-09 DIAGNOSIS — E1122 Type 2 diabetes mellitus with diabetic chronic kidney disease: Secondary | ICD-10-CM | POA: Diagnosis not present

## 2017-12-09 DIAGNOSIS — M6281 Muscle weakness (generalized): Secondary | ICD-10-CM | POA: Diagnosis not present

## 2017-12-09 DIAGNOSIS — I129 Hypertensive chronic kidney disease with stage 1 through stage 4 chronic kidney disease, or unspecified chronic kidney disease: Secondary | ICD-10-CM | POA: Diagnosis not present

## 2017-12-09 DIAGNOSIS — E11319 Type 2 diabetes mellitus with unspecified diabetic retinopathy without macular edema: Secondary | ICD-10-CM | POA: Diagnosis not present

## 2017-12-11 DIAGNOSIS — I69398 Other sequelae of cerebral infarction: Secondary | ICD-10-CM | POA: Diagnosis not present

## 2017-12-11 DIAGNOSIS — M6281 Muscle weakness (generalized): Secondary | ICD-10-CM | POA: Diagnosis not present

## 2017-12-11 DIAGNOSIS — E11319 Type 2 diabetes mellitus with unspecified diabetic retinopathy without macular edema: Secondary | ICD-10-CM | POA: Diagnosis not present

## 2017-12-11 DIAGNOSIS — S92412D Displaced fracture of proximal phalanx of left great toe, subsequent encounter for fracture with routine healing: Secondary | ICD-10-CM | POA: Diagnosis not present

## 2017-12-11 DIAGNOSIS — E1122 Type 2 diabetes mellitus with diabetic chronic kidney disease: Secondary | ICD-10-CM | POA: Diagnosis not present

## 2017-12-11 DIAGNOSIS — I129 Hypertensive chronic kidney disease with stage 1 through stage 4 chronic kidney disease, or unspecified chronic kidney disease: Secondary | ICD-10-CM | POA: Diagnosis not present

## 2017-12-12 DIAGNOSIS — I129 Hypertensive chronic kidney disease with stage 1 through stage 4 chronic kidney disease, or unspecified chronic kidney disease: Secondary | ICD-10-CM | POA: Diagnosis not present

## 2017-12-12 DIAGNOSIS — S92412D Displaced fracture of proximal phalanx of left great toe, subsequent encounter for fracture with routine healing: Secondary | ICD-10-CM | POA: Diagnosis not present

## 2017-12-12 DIAGNOSIS — M6281 Muscle weakness (generalized): Secondary | ICD-10-CM | POA: Diagnosis not present

## 2017-12-12 DIAGNOSIS — I69398 Other sequelae of cerebral infarction: Secondary | ICD-10-CM | POA: Diagnosis not present

## 2017-12-12 DIAGNOSIS — E1122 Type 2 diabetes mellitus with diabetic chronic kidney disease: Secondary | ICD-10-CM | POA: Diagnosis not present

## 2017-12-12 DIAGNOSIS — E11319 Type 2 diabetes mellitus with unspecified diabetic retinopathy without macular edema: Secondary | ICD-10-CM | POA: Diagnosis not present

## 2017-12-16 DIAGNOSIS — I129 Hypertensive chronic kidney disease with stage 1 through stage 4 chronic kidney disease, or unspecified chronic kidney disease: Secondary | ICD-10-CM | POA: Diagnosis not present

## 2017-12-16 DIAGNOSIS — M6281 Muscle weakness (generalized): Secondary | ICD-10-CM | POA: Diagnosis not present

## 2017-12-16 DIAGNOSIS — S92412D Displaced fracture of proximal phalanx of left great toe, subsequent encounter for fracture with routine healing: Secondary | ICD-10-CM | POA: Diagnosis not present

## 2017-12-16 DIAGNOSIS — I69398 Other sequelae of cerebral infarction: Secondary | ICD-10-CM | POA: Diagnosis not present

## 2017-12-16 DIAGNOSIS — E1122 Type 2 diabetes mellitus with diabetic chronic kidney disease: Secondary | ICD-10-CM | POA: Diagnosis not present

## 2017-12-16 DIAGNOSIS — E11319 Type 2 diabetes mellitus with unspecified diabetic retinopathy without macular edema: Secondary | ICD-10-CM | POA: Diagnosis not present

## 2017-12-17 DIAGNOSIS — S92412D Displaced fracture of proximal phalanx of left great toe, subsequent encounter for fracture with routine healing: Secondary | ICD-10-CM | POA: Diagnosis not present

## 2017-12-17 DIAGNOSIS — I69398 Other sequelae of cerebral infarction: Secondary | ICD-10-CM | POA: Diagnosis not present

## 2017-12-17 DIAGNOSIS — M6281 Muscle weakness (generalized): Secondary | ICD-10-CM | POA: Diagnosis not present

## 2017-12-17 DIAGNOSIS — E11319 Type 2 diabetes mellitus with unspecified diabetic retinopathy without macular edema: Secondary | ICD-10-CM | POA: Diagnosis not present

## 2017-12-17 DIAGNOSIS — I129 Hypertensive chronic kidney disease with stage 1 through stage 4 chronic kidney disease, or unspecified chronic kidney disease: Secondary | ICD-10-CM | POA: Diagnosis not present

## 2017-12-17 DIAGNOSIS — E1122 Type 2 diabetes mellitus with diabetic chronic kidney disease: Secondary | ICD-10-CM | POA: Diagnosis not present

## 2017-12-20 ENCOUNTER — Encounter: Payer: Self-pay | Admitting: Cardiology

## 2017-12-23 DIAGNOSIS — M6281 Muscle weakness (generalized): Secondary | ICD-10-CM | POA: Diagnosis not present

## 2017-12-23 DIAGNOSIS — S92412D Displaced fracture of proximal phalanx of left great toe, subsequent encounter for fracture with routine healing: Secondary | ICD-10-CM | POA: Diagnosis not present

## 2017-12-23 DIAGNOSIS — E1122 Type 2 diabetes mellitus with diabetic chronic kidney disease: Secondary | ICD-10-CM | POA: Diagnosis not present

## 2017-12-23 DIAGNOSIS — I129 Hypertensive chronic kidney disease with stage 1 through stage 4 chronic kidney disease, or unspecified chronic kidney disease: Secondary | ICD-10-CM | POA: Diagnosis not present

## 2017-12-23 DIAGNOSIS — I69398 Other sequelae of cerebral infarction: Secondary | ICD-10-CM | POA: Diagnosis not present

## 2017-12-23 DIAGNOSIS — E11319 Type 2 diabetes mellitus with unspecified diabetic retinopathy without macular edema: Secondary | ICD-10-CM | POA: Diagnosis not present

## 2017-12-24 ENCOUNTER — Other Ambulatory Visit: Payer: Self-pay | Admitting: Internal Medicine

## 2017-12-24 DIAGNOSIS — I152 Hypertension secondary to endocrine disorders: Secondary | ICD-10-CM

## 2017-12-24 DIAGNOSIS — I10 Essential (primary) hypertension: Principal | ICD-10-CM

## 2017-12-24 DIAGNOSIS — E1159 Type 2 diabetes mellitus with other circulatory complications: Secondary | ICD-10-CM

## 2017-12-24 NOTE — Telephone Encounter (Addendum)
Verified with Latoya at [facility] that they received Rx for verapamil sent 11/19/2017 for 30 with 1 refill. However, the way they fill the patient's med boxes will make patient be out in 2 days. Hubbard Hartshorn, RN, BSN

## 2017-12-24 NOTE — Telephone Encounter (Signed)
verapamil (CALAN-SR) 240 MG CR tablet(Expired), refill request @  [address] [place] - [place], [place] - [address] [phone] (Phone) [phone] (Fax)

## 2017-12-25 ENCOUNTER — Encounter: Payer: Medicare Other | Admitting: Internal Medicine

## 2017-12-26 DIAGNOSIS — I69398 Other sequelae of cerebral infarction: Secondary | ICD-10-CM | POA: Diagnosis not present

## 2017-12-26 DIAGNOSIS — M6281 Muscle weakness (generalized): Secondary | ICD-10-CM | POA: Diagnosis not present

## 2017-12-26 DIAGNOSIS — S92412D Displaced fracture of proximal phalanx of left great toe, subsequent encounter for fracture with routine healing: Secondary | ICD-10-CM | POA: Diagnosis not present

## 2017-12-26 DIAGNOSIS — E11319 Type 2 diabetes mellitus with unspecified diabetic retinopathy without macular edema: Secondary | ICD-10-CM | POA: Diagnosis not present

## 2017-12-26 DIAGNOSIS — E1122 Type 2 diabetes mellitus with diabetic chronic kidney disease: Secondary | ICD-10-CM | POA: Diagnosis not present

## 2017-12-26 DIAGNOSIS — I129 Hypertensive chronic kidney disease with stage 1 through stage 4 chronic kidney disease, or unspecified chronic kidney disease: Secondary | ICD-10-CM | POA: Diagnosis not present

## 2017-12-27 ENCOUNTER — Telehealth: Payer: Self-pay

## 2017-12-27 NOTE — Telephone Encounter (Signed)
Chelsea [name] called requesting verbal orders for this patient to receive 2xwk X 4wks followed by 1xwk X 3wks.  Called her back and Approved veral orders.

## 2017-12-31 ENCOUNTER — Encounter: Payer: Self-pay | Admitting: Cardiology

## 2017-12-31 DIAGNOSIS — E785 Hyperlipidemia, unspecified: Secondary | ICD-10-CM | POA: Diagnosis not present

## 2017-12-31 DIAGNOSIS — Z9181 History of falling: Secondary | ICD-10-CM | POA: Diagnosis not present

## 2017-12-31 DIAGNOSIS — Z7982 Long term (current) use of aspirin: Secondary | ICD-10-CM | POA: Diagnosis not present

## 2017-12-31 DIAGNOSIS — N189 Chronic kidney disease, unspecified: Secondary | ICD-10-CM | POA: Diagnosis not present

## 2017-12-31 DIAGNOSIS — E11319 Type 2 diabetes mellitus with unspecified diabetic retinopathy without macular edema: Secondary | ICD-10-CM | POA: Diagnosis not present

## 2017-12-31 DIAGNOSIS — I129 Hypertensive chronic kidney disease with stage 1 through stage 4 chronic kidney disease, or unspecified chronic kidney disease: Secondary | ICD-10-CM | POA: Diagnosis not present

## 2017-12-31 DIAGNOSIS — M6281 Muscle weakness (generalized): Secondary | ICD-10-CM | POA: Diagnosis not present

## 2017-12-31 DIAGNOSIS — S92412D Displaced fracture of proximal phalanx of left great toe, subsequent encounter for fracture with routine healing: Secondary | ICD-10-CM | POA: Diagnosis not present

## 2017-12-31 DIAGNOSIS — I69398 Other sequelae of cerebral infarction: Secondary | ICD-10-CM | POA: Diagnosis not present

## 2017-12-31 DIAGNOSIS — Z794 Long term (current) use of insulin: Secondary | ICD-10-CM | POA: Diagnosis not present

## 2017-12-31 DIAGNOSIS — E669 Obesity, unspecified: Secondary | ICD-10-CM | POA: Diagnosis not present

## 2017-12-31 DIAGNOSIS — E1122 Type 2 diabetes mellitus with diabetic chronic kidney disease: Secondary | ICD-10-CM | POA: Diagnosis not present

## 2018-01-01 ENCOUNTER — Other Ambulatory Visit: Payer: Self-pay | Admitting: Adult Health

## 2018-01-01 ENCOUNTER — Ambulatory Visit: Payer: Medicare Other | Admitting: Cardiology

## 2018-01-01 ENCOUNTER — Ambulatory Visit (INDEPENDENT_AMBULATORY_CARE_PROVIDER_SITE_OTHER): Payer: Medicare Other

## 2018-01-01 ENCOUNTER — Telehealth: Payer: Self-pay | Admitting: Dietician

## 2018-01-01 DIAGNOSIS — I63412 Cerebral infarction due to embolism of left middle cerebral artery: Secondary | ICD-10-CM

## 2018-01-01 DIAGNOSIS — Z794 Long term (current) use of insulin: Principal | ICD-10-CM

## 2018-01-01 DIAGNOSIS — I639 Cerebral infarction, unspecified: Secondary | ICD-10-CM

## 2018-01-01 DIAGNOSIS — I4891 Unspecified atrial fibrillation: Secondary | ICD-10-CM | POA: Diagnosis not present

## 2018-01-01 DIAGNOSIS — E113593 Type 2 diabetes mellitus with proliferative diabetic retinopathy without macular edema, bilateral: Secondary | ICD-10-CM

## 2018-01-02 DIAGNOSIS — I69398 Other sequelae of cerebral infarction: Secondary | ICD-10-CM | POA: Diagnosis not present

## 2018-01-02 DIAGNOSIS — E1122 Type 2 diabetes mellitus with diabetic chronic kidney disease: Secondary | ICD-10-CM | POA: Diagnosis not present

## 2018-01-02 DIAGNOSIS — S92412D Displaced fracture of proximal phalanx of left great toe, subsequent encounter for fracture with routine healing: Secondary | ICD-10-CM | POA: Diagnosis not present

## 2018-01-02 DIAGNOSIS — E11319 Type 2 diabetes mellitus with unspecified diabetic retinopathy without macular edema: Secondary | ICD-10-CM | POA: Diagnosis not present

## 2018-01-02 DIAGNOSIS — M6281 Muscle weakness (generalized): Secondary | ICD-10-CM | POA: Diagnosis not present

## 2018-01-02 DIAGNOSIS — I129 Hypertensive chronic kidney disease with stage 1 through stage 4 chronic kidney disease, or unspecified chronic kidney disease: Secondary | ICD-10-CM | POA: Diagnosis not present

## 2018-01-06 DIAGNOSIS — I69398 Other sequelae of cerebral infarction: Secondary | ICD-10-CM | POA: Diagnosis not present

## 2018-01-06 DIAGNOSIS — S92412D Displaced fracture of proximal phalanx of left great toe, subsequent encounter for fracture with routine healing: Secondary | ICD-10-CM | POA: Diagnosis not present

## 2018-01-06 DIAGNOSIS — E11319 Type 2 diabetes mellitus with unspecified diabetic retinopathy without macular edema: Secondary | ICD-10-CM | POA: Diagnosis not present

## 2018-01-06 DIAGNOSIS — I129 Hypertensive chronic kidney disease with stage 1 through stage 4 chronic kidney disease, or unspecified chronic kidney disease: Secondary | ICD-10-CM | POA: Diagnosis not present

## 2018-01-06 DIAGNOSIS — M6281 Muscle weakness (generalized): Secondary | ICD-10-CM | POA: Diagnosis not present

## 2018-01-06 DIAGNOSIS — E1122 Type 2 diabetes mellitus with diabetic chronic kidney disease: Secondary | ICD-10-CM | POA: Diagnosis not present

## 2018-01-10 DIAGNOSIS — E1122 Type 2 diabetes mellitus with diabetic chronic kidney disease: Secondary | ICD-10-CM | POA: Diagnosis not present

## 2018-01-10 DIAGNOSIS — E11319 Type 2 diabetes mellitus with unspecified diabetic retinopathy without macular edema: Secondary | ICD-10-CM | POA: Diagnosis not present

## 2018-01-10 DIAGNOSIS — S92412D Displaced fracture of proximal phalanx of left great toe, subsequent encounter for fracture with routine healing: Secondary | ICD-10-CM | POA: Diagnosis not present

## 2018-01-10 DIAGNOSIS — I129 Hypertensive chronic kidney disease with stage 1 through stage 4 chronic kidney disease, or unspecified chronic kidney disease: Secondary | ICD-10-CM | POA: Diagnosis not present

## 2018-01-10 DIAGNOSIS — I69398 Other sequelae of cerebral infarction: Secondary | ICD-10-CM | POA: Diagnosis not present

## 2018-01-10 DIAGNOSIS — M6281 Muscle weakness (generalized): Secondary | ICD-10-CM | POA: Diagnosis not present

## 2018-01-13 ENCOUNTER — Encounter: Payer: Self-pay | Admitting: Physical Medicine & Rehabilitation

## 2018-01-13 ENCOUNTER — Encounter: Payer: Medicare Other | Attending: Physical Medicine & Rehabilitation | Admitting: Physical Medicine & Rehabilitation

## 2018-01-13 VITALS — BP 149/85 | HR 81 | Ht 63.0 in | Wt 240.0 lb

## 2018-01-13 DIAGNOSIS — Z794 Long term (current) use of insulin: Secondary | ICD-10-CM | POA: Insufficient documentation

## 2018-01-13 DIAGNOSIS — I6381 Other cerebral infarction due to occlusion or stenosis of small artery: Secondary | ICD-10-CM | POA: Diagnosis not present

## 2018-01-13 DIAGNOSIS — M6281 Muscle weakness (generalized): Secondary | ICD-10-CM | POA: Diagnosis not present

## 2018-01-13 DIAGNOSIS — I69398 Other sequelae of cerebral infarction: Secondary | ICD-10-CM | POA: Diagnosis not present

## 2018-01-13 DIAGNOSIS — E1122 Type 2 diabetes mellitus with diabetic chronic kidney disease: Secondary | ICD-10-CM | POA: Diagnosis not present

## 2018-01-13 DIAGNOSIS — I639 Cerebral infarction, unspecified: Secondary | ICD-10-CM | POA: Insufficient documentation

## 2018-01-13 DIAGNOSIS — E11319 Type 2 diabetes mellitus with unspecified diabetic retinopathy without macular edema: Secondary | ICD-10-CM | POA: Diagnosis not present

## 2018-01-13 DIAGNOSIS — I129 Hypertensive chronic kidney disease with stage 1 through stage 4 chronic kidney disease, or unspecified chronic kidney disease: Secondary | ICD-10-CM | POA: Diagnosis not present

## 2018-01-13 DIAGNOSIS — N189 Chronic kidney disease, unspecified: Secondary | ICD-10-CM | POA: Diagnosis not present

## 2018-01-13 DIAGNOSIS — S92912A Unspecified fracture of left toe(s), initial encounter for closed fracture: Secondary | ICD-10-CM | POA: Diagnosis not present

## 2018-01-13 DIAGNOSIS — E785 Hyperlipidemia, unspecified: Secondary | ICD-10-CM | POA: Diagnosis not present

## 2018-01-13 DIAGNOSIS — G894 Chronic pain syndrome: Secondary | ICD-10-CM | POA: Insufficient documentation

## 2018-01-13 DIAGNOSIS — Z7982 Long term (current) use of aspirin: Secondary | ICD-10-CM | POA: Diagnosis not present

## 2018-01-13 DIAGNOSIS — S92412D Displaced fracture of proximal phalanx of left great toe, subsequent encounter for fracture with routine healing: Secondary | ICD-10-CM | POA: Diagnosis not present

## 2018-01-13 DIAGNOSIS — Z9181 History of falling: Secondary | ICD-10-CM | POA: Diagnosis not present

## 2018-01-13 DIAGNOSIS — E669 Obesity, unspecified: Secondary | ICD-10-CM | POA: Diagnosis not present

## 2018-01-13 MED ORDER — ACCU-CHEK SOFTCLIX LANCET DEV KIT: PACK | 1 refills | Status: DC

## 2018-01-13 NOTE — Telephone Encounter (Signed)
Requests a new lancing device
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
## 2018-01-13 NOTE — Patient Instructions (Signed)
PLEASE FEEL FREE TO CALL OUR OFFICE WITH ANY PROBLEMS OR QUESTIONS ([phone])      

## 2018-01-14 DIAGNOSIS — M6281 Muscle weakness (generalized): Secondary | ICD-10-CM | POA: Diagnosis not present

## 2018-01-14 DIAGNOSIS — E1122 Type 2 diabetes mellitus with diabetic chronic kidney disease: Secondary | ICD-10-CM | POA: Diagnosis not present

## 2018-01-14 DIAGNOSIS — E11319 Type 2 diabetes mellitus with unspecified diabetic retinopathy without macular edema: Secondary | ICD-10-CM | POA: Diagnosis not present

## 2018-01-14 DIAGNOSIS — I129 Hypertensive chronic kidney disease with stage 1 through stage 4 chronic kidney disease, or unspecified chronic kidney disease: Secondary | ICD-10-CM | POA: Diagnosis not present

## 2018-01-14 DIAGNOSIS — S92412D Displaced fracture of proximal phalanx of left great toe, subsequent encounter for fracture with routine healing: Secondary | ICD-10-CM | POA: Diagnosis not present

## 2018-01-14 DIAGNOSIS — I69398 Other sequelae of cerebral infarction: Secondary | ICD-10-CM | POA: Diagnosis not present

## 2018-01-16 DIAGNOSIS — S92412D Displaced fracture of proximal phalanx of left great toe, subsequent encounter for fracture with routine healing: Secondary | ICD-10-CM | POA: Diagnosis not present

## 2018-01-16 DIAGNOSIS — E1122 Type 2 diabetes mellitus with diabetic chronic kidney disease: Secondary | ICD-10-CM | POA: Diagnosis not present

## 2018-01-16 DIAGNOSIS — M6281 Muscle weakness (generalized): Secondary | ICD-10-CM | POA: Diagnosis not present

## 2018-01-16 DIAGNOSIS — E11319 Type 2 diabetes mellitus with unspecified diabetic retinopathy without macular edema: Secondary | ICD-10-CM | POA: Diagnosis not present

## 2018-01-16 DIAGNOSIS — I129 Hypertensive chronic kidney disease with stage 1 through stage 4 chronic kidney disease, or unspecified chronic kidney disease: Secondary | ICD-10-CM | POA: Diagnosis not present

## 2018-01-16 DIAGNOSIS — I69398 Other sequelae of cerebral infarction: Secondary | ICD-10-CM | POA: Diagnosis not present

## 2018-01-20 ENCOUNTER — Other Ambulatory Visit: Payer: Self-pay | Admitting: Physical Medicine & Rehabilitation

## 2018-01-20 ENCOUNTER — Other Ambulatory Visit: Payer: Self-pay | Admitting: Internal Medicine

## 2018-01-20 DIAGNOSIS — E1159 Type 2 diabetes mellitus with other circulatory complications: Secondary | ICD-10-CM

## 2018-01-20 DIAGNOSIS — M6281 Muscle weakness (generalized): Secondary | ICD-10-CM | POA: Diagnosis not present

## 2018-01-20 DIAGNOSIS — E11319 Type 2 diabetes mellitus with unspecified diabetic retinopathy without macular edema: Secondary | ICD-10-CM | POA: Diagnosis not present

## 2018-01-20 DIAGNOSIS — E1122 Type 2 diabetes mellitus with diabetic chronic kidney disease: Secondary | ICD-10-CM | POA: Diagnosis not present

## 2018-01-20 DIAGNOSIS — I152 Hypertension secondary to endocrine disorders: Secondary | ICD-10-CM

## 2018-01-20 DIAGNOSIS — I129 Hypertensive chronic kidney disease with stage 1 through stage 4 chronic kidney disease, or unspecified chronic kidney disease: Secondary | ICD-10-CM | POA: Diagnosis not present

## 2018-01-20 DIAGNOSIS — I10 Essential (primary) hypertension: Principal | ICD-10-CM

## 2018-01-20 DIAGNOSIS — S92412D Displaced fracture of proximal phalanx of left great toe, subsequent encounter for fracture with routine healing: Secondary | ICD-10-CM | POA: Diagnosis not present

## 2018-01-20 DIAGNOSIS — I69398 Other sequelae of cerebral infarction: Secondary | ICD-10-CM | POA: Diagnosis not present

## 2018-01-23 DIAGNOSIS — S92412D Displaced fracture of proximal phalanx of left great toe, subsequent encounter for fracture with routine healing: Secondary | ICD-10-CM | POA: Diagnosis not present

## 2018-01-23 DIAGNOSIS — E11319 Type 2 diabetes mellitus with unspecified diabetic retinopathy without macular edema: Secondary | ICD-10-CM | POA: Diagnosis not present

## 2018-01-23 DIAGNOSIS — E1122 Type 2 diabetes mellitus with diabetic chronic kidney disease: Secondary | ICD-10-CM | POA: Diagnosis not present

## 2018-01-23 DIAGNOSIS — I69398 Other sequelae of cerebral infarction: Secondary | ICD-10-CM | POA: Diagnosis not present

## 2018-01-23 DIAGNOSIS — M6281 Muscle weakness (generalized): Secondary | ICD-10-CM | POA: Diagnosis not present

## 2018-01-23 DIAGNOSIS — I129 Hypertensive chronic kidney disease with stage 1 through stage 4 chronic kidney disease, or unspecified chronic kidney disease: Secondary | ICD-10-CM | POA: Diagnosis not present

## 2018-01-28 ENCOUNTER — Telehealth: Payer: Self-pay | Admitting: Dietician

## 2018-01-28 ENCOUNTER — Encounter: Payer: Self-pay | Admitting: Dietician

## 2018-01-28 NOTE — Telephone Encounter (Signed)
Chelsea [name] is a 67 y.o. female who was contacted on behalf of [facility] Geriatrics Task Force.   Diabetes Assessment  DM meds and BS checks -  "What medications are you taking for diabetes?" Lantus 22 units in morning/day, metformin -  "How often do you check your blood sugars at home?" 3x/day - "What have your blood sugars been?" 80-100 mostly, the highest 130s  High Blood Pressure Assessment  BP meds & BP checks -  "What medications are you taking for high blood pressure?" "3" - "How often do you check your blood pressure at home?" no - "What have your blood pressure readings been?" up/down  Coping with DM and BP - What else are you doing to help with your DM and BP -Diet? No trouble chewing or swallowing, eating 3 meals/day -  Exercise? Therapy for stroke 1x/week. trying to walk  Medication Access Issues  Medication Issues? -  "Are you getting your medicines refilled on time without skipping any doses?" yes - "Are you having any problems getting/taking your meds (cost, timing, transportation)?" no - Do you need any meds refilled? no  Conclusion  Close the call - Date of follow-up visit scheduled1/09/2018 - Any other questions or concerns?" no  advanced directives

## 2018-01-30 DIAGNOSIS — I69398 Other sequelae of cerebral infarction: Secondary | ICD-10-CM | POA: Diagnosis not present

## 2018-01-30 DIAGNOSIS — E1122 Type 2 diabetes mellitus with diabetic chronic kidney disease: Secondary | ICD-10-CM | POA: Diagnosis not present

## 2018-01-30 DIAGNOSIS — E11319 Type 2 diabetes mellitus with unspecified diabetic retinopathy without macular edema: Secondary | ICD-10-CM | POA: Diagnosis not present

## 2018-01-30 DIAGNOSIS — M6281 Muscle weakness (generalized): Secondary | ICD-10-CM | POA: Diagnosis not present

## 2018-01-30 DIAGNOSIS — I129 Hypertensive chronic kidney disease with stage 1 through stage 4 chronic kidney disease, or unspecified chronic kidney disease: Secondary | ICD-10-CM | POA: Diagnosis not present

## 2018-01-30 DIAGNOSIS — S92412D Displaced fracture of proximal phalanx of left great toe, subsequent encounter for fracture with routine healing: Secondary | ICD-10-CM | POA: Diagnosis not present

## 2018-02-03 DIAGNOSIS — I69398 Other sequelae of cerebral infarction: Secondary | ICD-10-CM | POA: Diagnosis not present

## 2018-02-03 DIAGNOSIS — M6281 Muscle weakness (generalized): Secondary | ICD-10-CM | POA: Diagnosis not present

## 2018-02-03 DIAGNOSIS — S92412D Displaced fracture of proximal phalanx of left great toe, subsequent encounter for fracture with routine healing: Secondary | ICD-10-CM | POA: Diagnosis not present

## 2018-02-03 DIAGNOSIS — E11319 Type 2 diabetes mellitus with unspecified diabetic retinopathy without macular edema: Secondary | ICD-10-CM | POA: Diagnosis not present

## 2018-02-03 DIAGNOSIS — I129 Hypertensive chronic kidney disease with stage 1 through stage 4 chronic kidney disease, or unspecified chronic kidney disease: Secondary | ICD-10-CM | POA: Diagnosis not present

## 2018-02-03 DIAGNOSIS — E1122 Type 2 diabetes mellitus with diabetic chronic kidney disease: Secondary | ICD-10-CM | POA: Diagnosis not present

## 2018-02-09 DIAGNOSIS — I129 Hypertensive chronic kidney disease with stage 1 through stage 4 chronic kidney disease, or unspecified chronic kidney disease: Secondary | ICD-10-CM | POA: Diagnosis not present

## 2018-02-09 DIAGNOSIS — E11319 Type 2 diabetes mellitus with unspecified diabetic retinopathy without macular edema: Secondary | ICD-10-CM | POA: Diagnosis not present

## 2018-02-09 DIAGNOSIS — E1122 Type 2 diabetes mellitus with diabetic chronic kidney disease: Secondary | ICD-10-CM | POA: Diagnosis not present

## 2018-02-09 DIAGNOSIS — S92412D Displaced fracture of proximal phalanx of left great toe, subsequent encounter for fracture with routine healing: Secondary | ICD-10-CM | POA: Diagnosis not present

## 2018-02-09 DIAGNOSIS — I69398 Other sequelae of cerebral infarction: Secondary | ICD-10-CM | POA: Diagnosis not present

## 2018-02-09 DIAGNOSIS — M6281 Muscle weakness (generalized): Secondary | ICD-10-CM | POA: Diagnosis not present

## 2018-02-10 ENCOUNTER — Telehealth: Payer: Self-pay

## 2018-02-10 NOTE — Telephone Encounter (Signed)
-----   Message from Venancio Poisson, NP sent at 02/10/2018  7:40 AM EST ----- Please advise patient that her 30-day cardiac event monitor did not show evidence of irregular heart rhythm.  As discussed during appointment, possible need for loop recorder if 30-day event monitor was negative.  Please ask if she has thought about loop recorder further and if she would like to pursue, order will be placed for her to be seen by cardiology.  Long discussion during prior follow-up appointment regarding loop recorder, importance to assess for atrial fibrillation for future stroke prevention, and many questions answered.  Please let me know which she would like to do.  Thank you.

## 2018-02-10 NOTE — Telephone Encounter (Signed)
Rn spoke with Chelsea [name] on dpr and pt on three way. Rn stated to daughter and pt that 30-day cardiac event monitor did not show evidence of irregular heart rhythm. It was discussed during appointment, possible need for loop recorder if 30-day event monitor was negative. Chelsea [name] stated her brother was present during the visit. Chelsea [name] stated she was not at her moms last visit. Rn ask pt if she would like to pursue the loop recorder implant. It was a long discussion during prior follow-up appointment regarding loop recorder, Its to assess for atrial fibrillation for future stroke prevention, and many questions answered. Chelsea [name] stated it would be her moms decision. The pts mom stated she would think about and call us back. Pts daughter was given the number to call back. The pt and daughter verbalized understanding. ------

## 2018-02-18 ENCOUNTER — Other Ambulatory Visit: Payer: Self-pay | Admitting: Internal Medicine

## 2018-02-18 DIAGNOSIS — I10 Essential (primary) hypertension: Principal | ICD-10-CM

## 2018-02-18 DIAGNOSIS — E1159 Type 2 diabetes mellitus with other circulatory complications: Secondary | ICD-10-CM

## 2018-02-18 DIAGNOSIS — I152 Hypertension secondary to endocrine disorders: Secondary | ICD-10-CM

## 2018-02-18 NOTE — Telephone Encounter (Signed)
Pt has an appt schedule for tomorrow w/PCP.

## 2018-02-19 ENCOUNTER — Ambulatory Visit (INDEPENDENT_AMBULATORY_CARE_PROVIDER_SITE_OTHER): Payer: Medicare Other | Admitting: Internal Medicine

## 2018-02-19 ENCOUNTER — Other Ambulatory Visit: Payer: Self-pay

## 2018-02-19 ENCOUNTER — Encounter (INDEPENDENT_AMBULATORY_CARE_PROVIDER_SITE_OTHER): Payer: Self-pay

## 2018-02-19 ENCOUNTER — Encounter: Payer: Self-pay | Admitting: Dietician

## 2018-02-19 ENCOUNTER — Ambulatory Visit (INDEPENDENT_AMBULATORY_CARE_PROVIDER_SITE_OTHER): Payer: Medicare Other | Admitting: Dietician

## 2018-02-19 ENCOUNTER — Encounter: Payer: Self-pay | Admitting: Internal Medicine

## 2018-02-19 VITALS — BP 190/66 | HR 76 | Temp 97.6°F | Ht 65.0 in | Wt 234.1 lb

## 2018-02-19 DIAGNOSIS — I69359 Hemiplegia and hemiparesis following cerebral infarction affecting unspecified side: Secondary | ICD-10-CM

## 2018-02-19 DIAGNOSIS — E785 Hyperlipidemia, unspecified: Secondary | ICD-10-CM

## 2018-02-19 DIAGNOSIS — E11319 Type 2 diabetes mellitus with unspecified diabetic retinopathy without macular edema: Secondary | ICD-10-CM

## 2018-02-19 DIAGNOSIS — I6340 Cerebral infarction due to embolism of unspecified cerebral artery: Secondary | ICD-10-CM

## 2018-02-19 DIAGNOSIS — Z794 Long term (current) use of insulin: Secondary | ICD-10-CM | POA: Diagnosis not present

## 2018-02-19 DIAGNOSIS — E1159 Type 2 diabetes mellitus with other circulatory complications: Secondary | ICD-10-CM | POA: Diagnosis not present

## 2018-02-19 DIAGNOSIS — Z7982 Long term (current) use of aspirin: Secondary | ICD-10-CM | POA: Diagnosis not present

## 2018-02-19 DIAGNOSIS — Z79899 Other long term (current) drug therapy: Secondary | ICD-10-CM

## 2018-02-19 DIAGNOSIS — E113593 Type 2 diabetes mellitus with proliferative diabetic retinopathy without macular edema, bilateral: Secondary | ICD-10-CM

## 2018-02-19 DIAGNOSIS — I10 Essential (primary) hypertension: Secondary | ICD-10-CM

## 2018-02-19 DIAGNOSIS — I152 Hypertension secondary to endocrine disorders: Secondary | ICD-10-CM

## 2018-02-19 LAB — POCT GLYCOSYLATED HEMOGLOBIN (HGB A1C): HEMOGLOBIN A1C: 6.3 % — AB (ref 4.0–5.6)

## 2018-02-19 LAB — GLUCOSE, CAPILLARY: GLUCOSE-CAPILLARY: 105 mg/dL — AB (ref 70–99)

## 2018-02-19 MED ORDER — SPIRONOLACTONE 50 MG PO TABS: 50.0000 mg | ORAL_TABLET | Freq: Every day | ORAL | 2 refills | Status: DC

## 2018-02-19 MED ORDER — SPIRONOLACTONE 50 MG PO TABS: 50.0000 mg | ORAL_TABLET | Freq: Every day | ORAL | 11 refills | Status: DC

## 2018-02-19 NOTE — Assessment & Plan Note (Addendum)
Assessment: Chelsea [name] blood pressure remains well above goal despite good compliance with multiple antihypertensives (spironolactone, verapamil, losartan, hydralazine). Her persistently high blood pressure maybe due to white coat hypertension given her claims of having normal blood pressure at home. She should also be worked up for secondary causes of hypertension. Will start out with getting a sleep study since she has a high STOP-BANG score indicating that she is at high risk of OSA. Will also increase her dose of spironolactone today and have her follow-up in 1 month.  Plan: 1. Provided free electronic BP cuff and instructed her to check her BP once a day and return in 1 month with the readings. 2. Increased spironolactone to 50 mg daily 3. Order sleep study
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
## 2018-02-19 NOTE — Patient Instructions (Signed)
Please record the time, amount and what food drinks and activities you have while wearing the continuous glucose monitor(CGM).  Bring the folder with you to follow up appointments  Do not have a CT or an MRI while wearing the CGM.   Please make an appointment for 1 week with me and a doctor for the first of two CGM downloads..   You will also return in 2 weeks to have your second download and the CGM remove  Donna [phone] 

## 2018-02-19 NOTE — Patient Instructions (Addendum)
You are found to have continued high blood pressure.  I will increase your spironolactone to 50 mg every day.  Please follow-up in 1 month to have your blood pressure rechecked with me.  I have also sent refills for your other medications to your pharmacy.

## 2018-02-19 NOTE — Progress Notes (Signed)
Documentation for Freestyle Libre Pro Continuous glucose monitoring  Freestyle Libre Pro CGM sensor placed today. Patient was educated about wearing sensor, keeping food, activity and medication log and when to call office. Patient was educated about how to care for the sensor and not to have an MRI, CT or Diathermy while wearing the sensor. Follow up was arranged with the patient for 1 week.   Lot #: [id number] Serial #: [id number] Expiration Date: 07/13/18  Debera Lat, RD 02/19/2018 2:07 PM.

## 2018-02-23 NOTE — Assessment & Plan Note (Addendum)
Assessment:  On Lantus 22 units and Metformin 500 mg BID. She is compliant with these medications. A1c today 6.3 (down from 7.6 3 months ago). CBG placed today. She will follow-up with [name] in 1 weeks.  Plan: 1. Continue Lantus 22 units QD 2. Metformin 500 mg BID

## 2018-02-23 NOTE — Assessment & Plan Note (Signed)
Assessment: Recently performed 30-day cardiac event monitor did not show evidence of irregular rhythm.  Her neurologist would like a loop recorder placed which may occur in the next several months. She remains complaint with aspirin and Lipitor therapy. She finished home health PT and has residual upper and lower extremity weakness but has overall improved.  Plan: 1. Continue aspirin and Lipitor for secondary prevention.

## 2018-02-24 NOTE — Progress Notes (Signed)
Internal Medicine Clinic Attending  Case discussed with Dr. Prince at the time of the visit.  We reviewed the resident's history and exam and pertinent patient test results.  I agree with the assessment, diagnosis, and plan of care documented in the resident's note.   

## 2018-02-26 ENCOUNTER — Encounter: Payer: Self-pay | Admitting: Dietician

## 2018-02-26 ENCOUNTER — Other Ambulatory Visit: Payer: Self-pay | Admitting: Dietician

## 2018-02-26 ENCOUNTER — Ambulatory Visit (INDEPENDENT_AMBULATORY_CARE_PROVIDER_SITE_OTHER): Payer: Medicare Other | Admitting: Dietician

## 2018-02-26 DIAGNOSIS — E119 Type 2 diabetes mellitus without complications: Secondary | ICD-10-CM | POA: Diagnosis not present

## 2018-02-26 DIAGNOSIS — Z794 Long term (current) use of insulin: Secondary | ICD-10-CM

## 2018-02-26 DIAGNOSIS — E113593 Type 2 diabetes mellitus with proliferative diabetic retinopathy without macular edema, bilateral: Secondary | ICD-10-CM

## 2018-02-26 MED ORDER — ACCU-CHEK FASTCLIX LANCETS MISC: 3 refills | Status: DC

## 2018-02-26 MED ORDER — ACCU-CHEK FASTCLIX LANCET KIT: PACK | 1 refills | Status: DC

## 2018-02-26 NOTE — Patient Instructions (Addendum)
Decrease Lantis to 18 units starting tomorrow morning.   Good job eating healthy, getting your rest, taking your medicines and controlling your blood sugars! You are doing great!   Your goal is to check your blood sugar twice a day, once in the morning and once in the afternoon.

## 2018-02-26 NOTE — Telephone Encounter (Signed)
Patient requested lancets and lancing device today.

## 2018-02-26 NOTE — Progress Notes (Signed)
Internal Medicine Clinic Attending  Case discussed with [facility].  I personally reviewed the CGM data, the resident's interpretation, and agree with the intervention.   Chelsea [name] wore the CGM for 8 days. The average reading was 100, % time in target was 81, % time below target was 16, and % time above target was. 3. Intervention will be to lower lantus from 22 units to 18 units to decrease hypoglycemia. The patient will be scheduled to see [name] for a final appointment.

## 2018-02-26 NOTE — Progress Notes (Signed)
Continuous glucose monitoring.   Start Time: 1010   End Time: 1045  Patient presents with her daughter for CGM results from [place] #1.   Average reading is 100, patient wore the CGM for 8 days. Time in range (70-180 mg/dL): 81% (Goal >70%) Time above range (>180 mg/dL) 3% Time below range (<70 mg/dL): 16% (Goal is <3%) Coefficient of variation: 33.2% (Goal is <36%) Standard variation: 33.2 mg/dL (Goal is <50 mg/dL)  Patient stays in target range between 9am and 12am, but drops below target from 12am to around 8am. Overnight average blood sugars are 72-84. Patient denies symptoms of low blood sugars during the night, and only wakes to urinate at 12am.  Discussed results with Dr. Lynnae January and she agreed to the following intervention. Intervention will be to decrease Lantus from 22 units to 18 units in the mornings, and checking her blood sugars twice a day, once in the morning and once in the afternoon. The patient is scheduled to see [name] for a final appointment.   Debera Lat, RD 02/26/2018 11:19 AM.

## 2018-03-05 ENCOUNTER — Telehealth: Payer: Self-pay | Admitting: Dietician

## 2018-03-05 ENCOUNTER — Encounter: Payer: Medicare Other | Admitting: Dietician

## 2018-03-05 DIAGNOSIS — E113593 Type 2 diabetes mellitus with proliferative diabetic retinopathy without macular edema, bilateral: Secondary | ICD-10-CM

## 2018-03-05 DIAGNOSIS — Z794 Long term (current) use of insulin: Principal | ICD-10-CM

## 2018-03-05 MED ORDER — GLUCOSE BLOOD VI STRP: ORAL_STRIP | 3 refills | Status: DC

## 2018-03-05 NOTE — Telephone Encounter (Signed)
Requests refill on test strips.

## 2018-03-05 NOTE — Telephone Encounter (Signed)
Changed appointment to Monday fro CGM follow up.

## 2018-03-06 ENCOUNTER — Ambulatory Visit: Payer: Medicare Other | Admitting: Adult Health

## 2018-03-10 ENCOUNTER — Other Ambulatory Visit: Payer: Self-pay | Admitting: Dietician

## 2018-03-10 ENCOUNTER — Ambulatory Visit: Payer: Medicare Other | Admitting: Dietician

## 2018-03-10 ENCOUNTER — Encounter: Payer: Self-pay | Admitting: Dietician

## 2018-03-10 DIAGNOSIS — Z794 Long term (current) use of insulin: Principal | ICD-10-CM

## 2018-03-10 DIAGNOSIS — E113593 Type 2 diabetes mellitus with proliferative diabetic retinopathy without macular edema, bilateral: Secondary | ICD-10-CM

## 2018-03-10 MED ORDER — INSULIN PEN NEEDLE 32G X 4 MM MISC: 3 refills | Status: DC

## 2018-03-10 NOTE — Progress Notes (Signed)
Diabetes Self-Management Education  Visit Type:  Follow-up(annual)  Appt. Start Time: 0900 Appt. End Time: 0926  03/10/2018  [name], identified by name and date of birth, is a 68 y.o. female with a diagnosis of Diabetes:  .   ASSESSMENT  Continuous glucose monitoring.   Patient presents with her daughter for CGM results from [facility] #2.   Average reading is 104, patient wore the CGM for 15 days. Time in range (70-180 mg/dL): 83% (Goal >70%) Time above range (>180 mg/dL) 4% Time below range (<70 mg/dL): 13% (Goal is <3%) Coefficient of variation: 33.1% (Goal is <36%) Standard variation: 34.4 mg/dL (Goal is <50 mg/dL)  24 hr recall:  18 units of Lantus at 7am B (8 AM): Cheerios, milk, coffee OR 1 egg, 1 toast, coffee, water L (1 PM): Chef salad - lettuce, tomato, ham/turkey, cheese; tea OR 2% milk D (6 PM): Chicken, corn, fried okra or potatoes, unsweetened tea Bedtime at 6pm or 7pm, wakes at 8pm to take nighttime medicines  Patient stays in target range all day between 7am and 3am, but drops below target from 3am to 7am. Overnight average blood sugars are 60-70. Patient reports no signs or symptoms of low blood sugars during the night or when she wakes in the morning.   Diabetes Self-Management Education - 03/10/18 1000      Psychosocial Assessment   Patient Belief/Attitude about Diabetes  Motivated to manage diabetes    Self-care barriers  Lack of transportation;Unsteady gait/risk for falls;Lack of material resources    Self-management support  Doctor's office;Family;CDE visits    Patient Concerns  Glycemic Control    Special Needs  Simplified materials    Preferred Learning Style  Auditory    Learning Readiness  Change in progress      Pre-Education Assessment   Patient understands incorporating nutritional management into lifestyle.  Needs Review    Patient understands monitoring blood glucose, interpreting and using results  Needs Review      Complications    Last HgB A1C per patient/outside source  6.3 %    How often do you check your blood sugar?  1-2 times/day    Fasting Blood glucose range (mg/dL)  70-129    Postprandial Blood glucose range (mg/dL)  130-179    Number of hypoglycemic episodes per month  0    Number of hyperglycemic episodes per week  2    Have you had a dilated eye exam in the past 12 months?  Yes      Patient Education   Nutrition management   Reviewed blood glucose goals for pre and post meals and how to evaluate the patients' food intake on their blood glucose level.    Monitoring  Other (comment)   educated about continuous glucose mnoitoring results     Individualized Goals (developed by patient)   Medications  take my medication as prescribed    Monitoring   test my blood glucose as discussed      Patient Self-Evaluation of Goals - Patient rates self as meeting previously set goals (% of time)   Medications  >75%      Post-Education Assessment   Patient understands incorporating nutritional management into lifestyle.  Demonstrates understanding / competency    Patient understands monitoring blood glucose, interpreting and using results  Demonstrates understanding / competency      Outcomes   Program Status  Completed      Subsequent Visit   Since your last visit have you continued  or begun to take your medications as prescribed?  Yes    Since your last visit have you experienced any weight changes?  No change   denies weight loss,actual wt not done today, stabkle trend   Since your last visit, are you checking your blood glucose at least once a day?  Yes      Learning Objective:  Patient will have a greater understanding of diabetes self-management. Patient education plan is to attend individual and/or group sessions per assessed needs and concerns.   Plan:  Coordination of care: Discussed results with Dr. Dareen Piano and he agreed that she continue on Lantus at 18 units in the mornings. Patient reports she  is satisfied with continuing at the dosage. The patient is scheduled for a follow-up appointment with Dr. Alfonse Spruce on 03/26/18.   Daughter has agreed to remind her to check her blood sugars at breakfast and at lunch to help inform her food choices for future meals in order to better help her control her blood sugars. Encouraged patient to choose low carb veggies (examples included in her after visit summary) such as greens, tomatoes, stewed okra if her blood sugars are higher during the day, all of which she reports liking. Patient will follow up with me every 6 to 12 months.   Patient Instructions  Your blood sugar is doing great!!  Keep injecting 18 units lantus each day.  Check blood sugar as discussed.  If blood sugar is higher than you'd like during the day, consider eating low carb veggies like greens, carrots, green beans, stewed okra, stewed tomatoes, broccoli, cabbage... ALL ARE great for blood sugars...  Follow up with me every 6-12 months.   [name] [phone]   Expected Outcomes:  Demonstrated interest in learning. Expect positive outcomes Education material provided:  After visit summary, Continuous glucose monitoring report If problems or questions, patient to contact team via:  Phone Future DSME appointment: - 6 months(or as needed, annual follow up)  Debera Lat, RD 03/10/2018 9:26 AM.

## 2018-03-10 NOTE — Patient Instructions (Addendum)
Your blood sugar is doing great!!  Keep injecting 18 units lantus each day.  Check blood sugar as discussed.  If blood sugar is higher than you'd like during the day, consider eating low carb veggies like greens, carrots, green beans, stewed okra, stewed tomatoes, broccoli, cabbage... ALL ARE great for blood sugars...  Follow up with me every 6-12 months.   [name] [phone]

## 2018-03-10 NOTE — Telephone Encounter (Signed)
Suggest shortest pen needle to ensure subcutaneous and not intramuscular insulin injections. Rx needs to go to [facility] as [employer] unable to [employer] [facility] for diabetes supplies.

## 2018-03-14 ENCOUNTER — Telehealth: Payer: Self-pay

## 2018-03-14 NOTE — Telephone Encounter (Signed)
Faxed form to [employer] on 03/14/2018.

## 2018-03-17 ENCOUNTER — Other Ambulatory Visit: Payer: Self-pay | Admitting: Internal Medicine

## 2018-03-17 DIAGNOSIS — I152 Hypertension secondary to endocrine disorders: Secondary | ICD-10-CM

## 2018-03-17 DIAGNOSIS — E1159 Type 2 diabetes mellitus with other circulatory complications: Secondary | ICD-10-CM

## 2018-03-17 DIAGNOSIS — I10 Essential (primary) hypertension: Principal | ICD-10-CM

## 2018-03-26 ENCOUNTER — Encounter: Payer: Self-pay | Admitting: Internal Medicine

## 2018-03-26 ENCOUNTER — Ambulatory Visit (INDEPENDENT_AMBULATORY_CARE_PROVIDER_SITE_OTHER): Payer: Medicare Other | Admitting: Internal Medicine

## 2018-03-26 ENCOUNTER — Other Ambulatory Visit: Payer: Self-pay

## 2018-03-26 VITALS — BP 157/48 | HR 74 | Temp 98.5°F | Ht 65.0 in | Wt 233.2 lb

## 2018-03-26 DIAGNOSIS — I6340 Cerebral infarction due to embolism of unspecified cerebral artery: Secondary | ICD-10-CM | POA: Diagnosis not present

## 2018-03-26 DIAGNOSIS — Z79899 Other long term (current) drug therapy: Secondary | ICD-10-CM | POA: Diagnosis not present

## 2018-03-26 DIAGNOSIS — E669 Obesity, unspecified: Secondary | ICD-10-CM

## 2018-03-26 DIAGNOSIS — E1159 Type 2 diabetes mellitus with other circulatory complications: Secondary | ICD-10-CM | POA: Diagnosis not present

## 2018-03-26 DIAGNOSIS — I10 Essential (primary) hypertension: Secondary | ICD-10-CM

## 2018-03-26 DIAGNOSIS — I152 Hypertension secondary to endocrine disorders: Secondary | ICD-10-CM

## 2018-03-26 DIAGNOSIS — E113593 Type 2 diabetes mellitus with proliferative diabetic retinopathy without macular edema, bilateral: Secondary | ICD-10-CM

## 2018-03-26 DIAGNOSIS — Z6838 Body mass index (BMI) 38.0-38.9, adult: Secondary | ICD-10-CM

## 2018-03-26 DIAGNOSIS — Z794 Long term (current) use of insulin: Secondary | ICD-10-CM | POA: Diagnosis not present

## 2018-03-26 LAB — GLUCOSE, CAPILLARY: GLUCOSE-CAPILLARY: 144 mg/dL — AB (ref 70–99)

## 2018-03-26 NOTE — Patient Instructions (Addendum)
Please start taking:  Spironolactone 50 mg daily  Please check your blood pressure twice a day and bring in the cuff at next clinic visit.
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
## 2018-03-26 NOTE — Progress Notes (Signed)
Internal Medicine Clinic Attending  Case discussed with Dr. Prince at the time of the visit.  We reviewed the resident's history and exam and pertinent patient test results.  I agree with the assessment, diagnosis, and plan of care documented in the resident's note.   

## 2018-03-26 NOTE — Assessment & Plan Note (Signed)
Assessment: Ms. Lewey blood pressure remains elevated today despite use of housing, losartan, spironolactone, and verapamil.  I increase her dose of spironolactone to 50 mg daily but she states that she did not pick this up for fear that it was not going to be covered by insurance.  I reiterated the importance of taking his increased dose of medication and she expressed understanding and agreement.  She states that she will call the pharmacy tomorrow.  I also ordered a home sleep study to evaluate for OSA as a cause of her resistant hypertension.  She is very hesitant about having this done stating that she wants to try the increased dose of antihypertensive prior to obtaining a sleep study.  I explained to her the importance of evaluating for OSA as this may be contributing to her high blood pressure.  She would like to defer sleep study for now.  Plan: 1.  Recommended taking the increased dose of Spironolactone 50 mg daily. 2.  We will have her follow-up in 1 month for recheck of her blood pressure.  I have asked her to check her blood pressure 1-2x/day and bring this in at her next clinic visit.

## 2018-03-26 NOTE — Assessment & Plan Note (Addendum)
Assessment/plan: It is unclear what medications Chelsea [name] is actually taking at home.  I do think that she would benefit from [facility] and medication management.  I have sent in an ambulatory referral for [facility].

## 2018-03-26 NOTE — Assessment & Plan Note (Signed)
Assessment/plan: She continues to take Lantus 22 units and metformin 500 mg twice daily.  Capillary blood glucose within normal limits.  We will continue current medications.

## 2018-03-27 ENCOUNTER — Other Ambulatory Visit: Payer: Self-pay | Admitting: *Deleted

## 2018-03-27 NOTE — Patient Outreach (Signed)
[facility]) Care Management  03/27/2018  TEASIA ZAPF 05-20-1950 [id number]   Subjective: Telephone call to patient's home  / mobile number, no answer, message states mailbox full, and unable to leave a message.    Objective: Per KPN (Knowledge Performance Now, point of care tool) and chart review, patient hospitalized 10/09/17 -10/29/2017 for inpatient rehab, status post inpatient hospitalization 10/06/17- 10/09/17 for Stroke due to embolism.    Patient also has a history of diabetes, hypertension, alcohol abuse, asthma, uterine fibroid, depression, and fournier's gangrene.      Assessment: Received Medicare MD referral on  03/27/2018.  Referral reason:  Reason for consult: Medication management and blood pressure check.   Diagnoses of Stroke: Ischemic/TIA, Diabetes,  Other Diagnosis: uncontrolled hypertension.     Screening  follow up pending patient contact.      Plan: RNCM will send unsuccessful outreach  letter, [facility] pamphlet, will call patient for 2nd telephone outreach attempt within 4 business days, screening follow up, and will proceed with case closure within 10 business days if no return call.      Kace Hartje H. Annia Friendly, BSN, [place] Management [facility] Telephonic CM Phone: [phone] Fax: [phone]

## 2018-03-28 ENCOUNTER — Other Ambulatory Visit: Payer: Self-pay | Admitting: *Deleted

## 2018-03-28 NOTE — Patient Outreach (Signed)
[place] [facility]) Care Management  03/28/2018  Chelsea [name] April 25, 1950 [id number]   Subjective: Telephone call to patient's home / mobile number, spoke with patient, and HIPAA verified.  Discussed [facility] Care Management Medicare MD Referral (Dr. Oval Linsey) follow up, patient voiced understanding, and is in agreement to follow up.   Patient states she is doing okay, she is aware of her MD's  Referral to [facility], diabetes is doing better, is needing assistance with blood pressure checks, blood pressure management, and medication management.   Patient  is in agreement to referral to [facility] for blood pressure monitoring, blood pressure cuff education, and hypertension education related to diabetes.   Patient is in agreement to referral to [facility] for medication review and management.  Patient states she has had recent medication changes.  Patient states she receives medication through the mail, RNCM advised [facility] services will not impact patient's current medication delivery system, and patient voices understanding.    Patient states she is able to manage self care and has a supportive family that will provide assistance as needed.  Patient voices understanding of medical diagnosis and treatment plan.  Discussed [facility] Care Management Social Work services, patient voiced understanding, declined referral at this time, and will access if needed in the future.  Patient states she does not have any,  transition of care, transportation, or community resource needs at this time.  States she is very appreciative of the follow up and is in agreement to receive [facility] services.  Discussed Advanced Directives, patient voices understanding, and  declined to receive Advanced Directives information at this time.      Objective: Per KPN (Knowledge Performance Now, point of care tool) and chart review, patient hospitalized 10/09/17  -10/29/2017 for inpatient rehab, status post inpatient hospitalization 10/06/17- 10/09/17 for Stroke due to embolism.    Patient also has a history of diabetes, hypertension, alcohol abuse, asthma, uterine fibroid, depression, and fournier's gangrene.      Assessment: Received Medicare MD referral on  03/27/2018.  Referral reason:  Reason for consult: Medication management and blood pressure check.   Diagnoses of Stroke: Ischemic/TIA, Diabetes,  Other Diagnosis: uncontrolled hypertension.     Screening follow up completed, patient referred to [facility].      Plan: RNCM will send referral to [facility] for for blood pressure monitoring, blood pressure cuff education, and hypertension education related to diabetes. RNCM will send referral to [facility] for medication review and management.       Recardo Linn H. Annia Friendly, BSN, [place] Management [facility] Telephonic CM Phone: [phone] Fax: [phone]

## 2018-04-02 ENCOUNTER — Other Ambulatory Visit: Payer: Self-pay | Admitting: Internal Medicine

## 2018-04-03 ENCOUNTER — Other Ambulatory Visit: Payer: Self-pay | Admitting: Pharmacist

## 2018-04-04 ENCOUNTER — Ambulatory Visit: Payer: Self-pay | Admitting: Pharmacist

## 2018-04-04 ENCOUNTER — Other Ambulatory Visit: Payer: Self-pay | Admitting: *Deleted

## 2018-04-04 NOTE — Patient Outreach (Signed)
[facility]) Care Management  04/04/2018  LUCCA GREGGS 01/23/1951 [id number]   Referral received from telephonic care manager requesting home assessment/evaluation for member to manage hypertension and medication management.  Per chart, she has history of hypertension, stoke, GERD, diabetes, chronic kidney disease, and hyperlipidemia.    Call placed to member, identity verified.  This care manager introduced self and stated purpose of call.  [facility] care management services explained.  She agrees she need help managing her health care, particularly her hypertension.  Agrees to home visit next week.  Denies any urgent concerns at this time.  Valente David, [place], MSN [place] [phone]

## 2018-04-07 NOTE — Patient Outreach (Addendum)
[facility]) Care Management  [place]  04/07/2018  Chelsea [name] August 24, 1950 [id number]   Reason for call: medication management  Unsuccessful telephone call attempt #1 to patient.   HIPAA compliant voicemail left requesting a return call  Plan:  I will make another outreach attempt to patient within 3-4 business days   Regina Eck, PharmD, [place]  [phone]

## 2018-04-08 ENCOUNTER — Other Ambulatory Visit: Payer: Self-pay | Admitting: Pharmacist

## 2018-04-08 ENCOUNTER — Ambulatory Visit: Payer: Self-pay | Admitting: Pharmacist

## 2018-04-09 ENCOUNTER — Encounter: Payer: Self-pay | Admitting: *Deleted

## 2018-04-09 ENCOUNTER — Ambulatory Visit: Payer: Self-pay | Admitting: Pharmacist

## 2018-04-09 ENCOUNTER — Other Ambulatory Visit: Payer: Self-pay | Admitting: *Deleted

## 2018-04-09 NOTE — Patient Outreach (Signed)
[facility] [facility]) Care Management   04/09/2018  Chelsea [name] [birth year] [id number]  Chelsea [name] is an 68 y.o. female  Subjective:   Member alert and oriented x3, denies any complaints of pain or discomfort.  Report daughter in law help with medication management.  Has appointment with neurology tomorrow, appointment with primary MD 3/4.  Objective:   Review of Systems  Constitutional: Negative.   HENT: Negative.   Eyes: Negative.   Respiratory: Negative.   Cardiovascular: Negative.   Gastrointestinal: Negative.   Genitourinary: Negative.   Musculoskeletal: Negative.   Skin: Negative.   Neurological: Negative.   Endo/Heme/Allergies: Negative.   Psychiatric/Behavioral: Negative.     Physical Exam  Constitutional: She is oriented to person, place, and time. She appears well-developed and well-nourished.  Neck: Normal range of motion.  Cardiovascular: Normal rate, regular rhythm and normal heart sounds.  Respiratory: Effort normal and breath sounds normal.  GI: Soft. Bowel sounds are normal.  Musculoskeletal: Normal range of motion.  Neurological: She is alert and oriented to person, place, and time.  Skin: Skin is warm and dry.   BP (!) 154/80 (BP Location: Right Arm, Patient Position: Sitting, Cuff Size: Normal)   Pulse 78   Resp 18   Ht 1.626 m ('5\' 4"' )   Wt 233 lb (105.7 kg)   SpO2 98%   BMI 39.99 kg/m   Encounter Medications:   Outpatient Encounter Medications as of 04/09/2018  Medication Sig  . ACCU-CHEK FASTCLIX LANCETS MISC Use to check blood sugar twice a day as discussed.  [name] Kitchen acetaminophen (TYLENOL) 325 MG tablet Take 2 tablets (650 mg total) by mouth every 6 (six) hours as needed for mild pain or moderate pain.  [name] Kitchen albuterol (VENTOLIN HFA) 108 (90 Base) MCG/ACT inhaler INHALE 2 PUFFS into lungs EVERY 6 HOURS AS NEEDED FOR WHEEZING OR SHORTNESS OF BREATH  . aspirin EC 81 MG EC tablet Take 1 tablet (81 mg total) by mouth daily.  [name] Kitchen atorvastatin  (LIPITOR) 80 MG tablet TAKE 1 TABLET BY MOUTH EVERY DAY  . Blood Glucose Monitoring Suppl KIT by Does not apply route.    . cetaphil (CETAPHIL) lotion Apply 1 application topically as needed for dry skin.  . citalopram (CELEXA) 10 MG tablet TAKE 1 TABLET BY MOUTH AT BEDTIME  . fluticasone (FLONASE) 50 MCG/ACT nasal spray Place 2 sprays into both nostrils daily.  [name] Kitchen glucose blood (ACCU-CHEK AVIVA PLUS) test strip Check blood sugar three times a day  . hydrALAZINE (APRESOLINE) 25 MG tablet TAKE 1 TABLET BY MOUTH EVERY 8 HOURS  . hydrALAZINE (APRESOLINE) 25 MG tablet TAKE 1 TABLET BY MOUTH EVERY 8 HOURS  . Incontinence Supply Disposable (BLADDER CONTROL PAD REGULAR) MISC Use daily as directed  . Insulin Glargine (LANTUS) 100 UNIT/ML Solostar Pen Inject 22 Units into the skin daily. (Patient taking differently: Inject 18 Units into the skin daily. )  . Insulin Pen Needle 32G X 4 MM MISC Use to inject insulin one time a day  . Lancets Misc. (ACCU-CHEK FASTCLIX LANCET) KIT Use to check blood sugar twice a day as discussed.  [name] Kitchen losartan (COZAAR) 100 MG tablet Take 1 tablet (100 mg total) by mouth daily.  . metFORMIN (GLUCOPHAGE-XR) 500 MG 24 hr tablet Take 1 tablet (500 mg total) by mouth daily with breakfast.  . SENNA-PLUS 8.6-50 MG tablet Take 2 tablets by mouth at bedtime.  [name] Kitchen spironolactone (ALDACTONE) 50 MG tablet Take 1 tablet (50 mg total) by mouth daily.  [name] Kitchen  verapamil (CALAN-SR) 240 MG CR tablet TAKE 1 TABLET BY MOUTH EVERY DAY  . naphazoline-pheniramine (VISINE-A) 0.025-0.3 % ophthalmic solution Place 1 drop into both eyes 4 (four) times daily as needed for irritation. (Patient not taking: Reported on 04/09/2018)  . ramelteon (ROZEREM) 8 MG tablet Take 1 tablet (8 mg total) by mouth at bedtime. (Patient not taking: Reported on 04/09/2018)  . [DISCONTINUED] Lancet Device MISC 1 each by Does not apply route 3 (three) times daily.   No facility-administered encounter medications on file as of  04/09/2018.     Functional Status:   In your present state of health, do you have any difficulty performing the following activities: 04/09/2018 03/26/2018  Hearing? N N  Vision? Y Y  Difficulty concentrating or making decisions? N N  Walking or climbing stairs? Y Y  Dressing or bathing? N N  Doing errands, shopping? Tempie Donning  Preparing Food and eating ? Y -  Using the Toilet? N -  In the past six months, have you accidently leaked urine? Y -  Do you have problems with loss of bowel control? N -  Managing your Medications? Y -  Managing your Finances? Y -  Housekeeping or managing your Housekeeping? Y -  Some recent data might be hidden    Fall/Depression Screening:    Fall Risk  04/09/2018 03/26/2018 02/19/2018  Falls in the past year? 0 0 1  Number falls in past yr: - - 0  Comment - - -  Injury with Fall? - - 0  Risk Factor Category  - - -  Risk for fall due to : - History of fall(s) History of fall(s);Impaired balance/gait  Risk for fall due to: Comment - - -  Follow up - Falls prevention discussed Falls prevention discussed   PHQ 2/9 Scores 03/28/2018 02/19/2018 11/19/2017 11/05/2017 09/04/2017 08/08/2017 08/01/2017  PHQ - 2 Score '1 3 3 ' 0 0 2 1  PHQ- 9 Score - 10 7 - - 4 8    Assessment:    Met with member at scheduled time.  [facility] care management services again explained, consent obtained.  She report she has the support of multiple family members, but state they also stress her out sometimes.  Discussed ways to manage stress in effort to keep blood pressure under control.  She feel she is doing better managing her blood pressure, including changing her diet.  She was unsure how to use her blood pressure monitor correctly therefore this care manager was consulted.  She is aware now of how to use both monitors, wrist and cuff.  Grandson also aware, if member unable to use independently family will provide help.  Denies any urgent concerns.  Provided with this care manager's contact  information.  Advised to contact with questions.  Plan:   Will follow up with member within the next month.  THN CM Care Plan Problem One     Most Recent Value  Care Plan Problem One  Knowledge deficit regarding management of hypertension   Role Documenting the Problem One  Care Management Coordinator  Care Plan for Problem One  Active  THN Long Term Goal   Member will report blood pressure within goal range within the next 45 days  THN Long Term Goal Start Date  04/09/18  Interventions for Problem One Long Term Goal  Member educated on complications of uncontrolled hypertension, including but not limited to stroke  [facility] CM Short Term Goal #1   Member will check and record  blood pressure daily over the next 4 weeks  THN CM Short Term Goal #1 Start Date  04/09/18  Interventions for Short Term Goal #1  Member and grandson educated on correct use of blood pressure monitor.  Provided with [facility] calendar book with blood pressure log  THN CM Short Term Goal #2   Member will take medications as prescribed over the next 4 weeks  THN CM Short Term Goal #2 Start Date  04/09/18  Interventions for Short Term Goal #2  Medications reviewed in the home.  Assesed the need for [facility] pharmacist     Valente David, RN, MSN [place] Manager [phone]

## 2018-04-09 NOTE — Patient Outreach (Signed)
[place] [facility]) Care Management  [place]  04/09/2018  Chelsea [name] [birth year] [id number]   Reason for call: medication management   Unsuccessful telephone call attempt #2 to patient.   HIPAA compliant voicemail left requesting a return call  Plan:  I will make another outreach attempt to patient within 3-4 business days   Regina Eck, PharmD, [place]  [phone]

## 2018-04-10 ENCOUNTER — Encounter: Payer: Self-pay | Admitting: Adult Health

## 2018-04-10 ENCOUNTER — Ambulatory Visit: Payer: Self-pay | Admitting: Pharmacist

## 2018-04-10 ENCOUNTER — Other Ambulatory Visit: Payer: Self-pay | Admitting: Pharmacist

## 2018-04-10 ENCOUNTER — Ambulatory Visit (INDEPENDENT_AMBULATORY_CARE_PROVIDER_SITE_OTHER): Payer: Medicare Other | Admitting: Adult Health

## 2018-04-10 ENCOUNTER — Other Ambulatory Visit: Payer: Self-pay

## 2018-04-10 VITALS — BP 155/64 | HR 83 | Resp 20 | Ht 64.0 in | Wt 233.0 lb

## 2018-04-10 DIAGNOSIS — I63412 Cerebral infarction due to embolism of left middle cerebral artery: Secondary | ICD-10-CM | POA: Diagnosis not present

## 2018-04-10 DIAGNOSIS — R269 Unspecified abnormalities of gait and mobility: Secondary | ICD-10-CM

## 2018-04-10 DIAGNOSIS — I69319 Unspecified symptoms and signs involving cognitive functions following cerebral infarction: Secondary | ICD-10-CM | POA: Diagnosis not present

## 2018-04-10 DIAGNOSIS — E1169 Type 2 diabetes mellitus with other specified complication: Secondary | ICD-10-CM

## 2018-04-10 DIAGNOSIS — E113593 Type 2 diabetes mellitus with proliferative diabetic retinopathy without macular edema, bilateral: Secondary | ICD-10-CM

## 2018-04-10 DIAGNOSIS — E1159 Type 2 diabetes mellitus with other circulatory complications: Secondary | ICD-10-CM | POA: Diagnosis not present

## 2018-04-10 DIAGNOSIS — I10 Essential (primary) hypertension: Secondary | ICD-10-CM

## 2018-04-10 DIAGNOSIS — E785 Hyperlipidemia, unspecified: Secondary | ICD-10-CM | POA: Diagnosis not present

## 2018-04-10 DIAGNOSIS — I152 Hypertension secondary to endocrine disorders: Secondary | ICD-10-CM

## 2018-04-10 DIAGNOSIS — Z794 Long term (current) use of insulin: Secondary | ICD-10-CM

## 2018-04-10 NOTE — Patient Instructions (Addendum)
Continue aspirin 81 mg daily  and lipitor  for secondary stroke prevention  Continue to follow up with PCP regarding cholesterol, blood pressure and diabetes management   Referral placed for physical and speech therapy - please call to schedule appt mid next week  Highly consider placement of loop recorder to look for atrial fibrillation as cause of stroke - call office when you would like to proceed and I will place order  Continue to monitor blood pressure at home  Maintain strict control of hypertension with blood pressure goal below 130/90, diabetes with hemoglobin A1c goal below 6.5% and cholesterol with LDL cholesterol (bad cholesterol) goal below 70 mg/dL. I also advised the patient to eat a healthy diet with plenty of whole grains, cereals, fruits and vegetables, exercise regularly and maintain ideal body weight.  Followup in the future with me in 6 months or call earlier if needed       Thank you for coming to see [place] at [facility]. I hope we have been able to provide you high quality care today.  You may receive a patient satisfaction survey over the next few weeks. We would appreciate your feedback and comments so that we may continue to improve ourselves and the health of our patients.

## 2018-04-10 NOTE — Progress Notes (Signed)
I agree with the above plan 

## 2018-04-10 NOTE — Patient Outreach (Signed)
[facility]) Care Management  [place]   04/10/2018  Chelsea [name] [birth year] [id number]  Reason for referral: Medication Management/Hypertension  Referral source: Dr. Alfonse Spruce Current insurance:Medicare/Medicaid  PMHx includes but not limited to:  HTN, T2DM, GERD, HLD  Outreach:  Successful telephone call with Chelsea [name].  HIPAA identifiers verified. Patient was distraced and not fully engaged in converstation.  Patient stated she "just doing alright".  She had a neurology appoitnemtn today.  She has been having BP issues and working with her PCP to control it.   She stated she picked up her spironolactone and started taking it last Friday, 04/04/2018.  At today's visit, her BP was 155/64.  She states she is now compliant with all of her medications x1 week .  I will check in the with [facility] RN CM for information on home visit to see if there are any other pharmacy needs.  Pharmacy to sign off.  Objective: Lab Results  Component Value Date   CREATININE 1.29 (H) 11/19/2017   CREATININE 1.34 (H) 10/29/2017   CREATININE 1.35 (H) 10/28/2017    Lab Results  Component Value Date   HGBA1C 6.3 (A) 02/19/2018    Lipid Panel     Component Value Date/Time   CHOL 252 (H) 10/07/2017 0507   TRIG 109 10/07/2017 0507   HDL 53 10/07/2017 0507   CHOLHDL 4.8 10/07/2017 0507   VLDL 22 10/07/2017 0507   LDLCALC 177 (H) 10/07/2017 0507    BP Readings from Last 3 Encounters:  04/10/18 (!) 155/64  04/09/18 (!) 154/80  03/26/18 (!) 157/48    Allergies  Allergen Reactions  . Atarax [Hydroxyzine]     Hallucination   . Lisinopril Cough  . Nsaids Nausea And Vomiting  . Penicillins Other (See Comments)    shaking    Medications Reviewed Today    Reviewed by Chelsea Poisson, NP (Nurse Practitioner) on 04/10/18 at 1116  Med List Status: <None>  Medication Order Taking? Sig Documenting Provider Last Dose Status Informant  ACCU-CHEK FASTCLIX LANCETS [place]  [id number] Yes Use to check blood sugar twice a day as discussed. Bartholomew Crews, MD Taking Active   acetaminophen (TYLENOL) 325 MG tablet [id number] Yes Take 2 tablets (650 mg total) by mouth every 6 (six) hours as needed for mild pain or moderate pain. Bary Leriche, PA-C Taking Active   albuterol (VENTOLIN HFA) 108 (90 Base) MCG/ACT inhaler [id number] Yes INHALE 2 PUFFS into lungs EVERY 6 HOURS AS NEEDED FOR WHEEZING OR SHORTNESS OF BREATH Lenore Cordia, MD Taking Active Multiple Informants  aspirin EC 81 MG EC tablet [id number] Yes Take 1 tablet (81 mg total) by mouth daily. Isabelle Course, MD Taking Active   atorvastatin (LIPITOR) 80 MG tablet [id number] Yes TAKE 1 TABLET BY MOUTH EVERY DAY Lenore Cordia, MD Taking Active Multiple Informants  Blood Glucose Monitoring Suppl KIT [id number] Yes by Does not apply route.   [provider] Taking Active Multiple Informants  cetaphil (CETAPHIL) lotion [id number] Yes Apply 1 application topically as needed for dry skin. Lenore Cordia, MD Taking Active Multiple Informants  citalopram (CELEXA) 10 MG tablet [id number] Yes TAKE 1 TABLET BY MOUTH AT BEDTIME Meredith Staggers, MD Taking Active   fluticasone [facility]) 50 MCG/ACT nasal spray [id number] Yes Place 2 sprays into both nostrils daily. Carroll Sage, MD Taking Active   glucose blood (ACCU-CHEK AVIVA PLUS) test strip [id number] Yes Check blood sugar three times a day Dareen Piano,  [place], MD Taking Active   hydrALAZINE (APRESOLINE) 25 MG tablet [id number] Yes TAKE 1 TABLET BY MOUTH EVERY 8 HOURS Oda Kilts, MD Taking Active   hydrALAZINE (APRESOLINE) 25 MG tablet [id number] Yes TAKE 1 TABLET BY MOUTH EVERY 8 HOURS Carroll Sage, MD Taking Active   Incontinence Supply Disposable (BLADDER CONTROL PAD REGULAR) MISC [id number] Yes Use daily as directed Acquanetta Chain, DO Taking Active Multiple Informants  Insulin Glargine (LANTUS) 100 UNIT/ML Solostar Pen [id number] Yes Inject 22 Units  into the skin daily.  Patient taking differently:  Inject 18 Units into the skin daily.    Kathi Ludwig, MD Taking Active   Insulin Pen Needle 32G X 4 MM MISC [id number] Yes Use to inject insulin one time a day Carroll Sage, MD Taking Active         Discontinued 04/05/14 1600   Lancets Misc. (ACCU-CHEK FASTCLIX LANCET) KIT [id number] Yes Use to check blood sugar twice a day as discussed. Bartholomew Crews, MD Taking Active   losartan (COZAAR) 100 MG tablet [id number] Yes Take 1 tablet (100 mg total) by mouth daily. Lenore Cordia, MD Taking Active Multiple Informants  metFORMIN (GLUCOPHAGE-XR) 500 MG 24 hr tablet [id number] Yes Take 1 tablet (500 mg total) by mouth daily with breakfast. Kathi Ludwig, MD Taking Active   naphazoline-pheniramine (VISINE-A) 0.025-0.3 % ophthalmic solution [id number] Yes Place 1 drop into both eyes 4 (four) times daily as needed for irritation. Moding, Langley Gauss, MD Taking Active Multiple Informants  ramelteon (ROZEREM) 8 MG tablet [id number] Yes Take 1 tablet (8 mg total) by mouth at bedtime. Alphonzo Grieve, MD Taking Active   SENNA-PLUS 8.6-50 MG tablet [id number] Yes Take 2 tablets by mouth at bedtime. Oda Kilts, MD Taking Active   spironolactone (ALDACTONE) 50 MG tablet [id number] Yes Take 1 tablet (50 mg total) by mouth daily. Carroll Sage, MD Taking Active   verapamil (CALAN-SR) 240 MG CR tablet [id number] Yes TAKE 1 TABLET BY MOUTH EVERY DAY Carroll Sage, MD Taking Active           Assessment:  Drugs sorted by system:  Neurologic/Psychologic: citalopram, ramelteon  Cardiovascular: ASA, atorvastatin, hydralazine, losartan, spironolactone, verapamil  Pulmonary/Allergy: albuterol  Gastrointestinal: senna plus  Endocrine: lantus, metformin   Medication Review Findings:  . Updated medication list in Epic. Duplicates removed . Patient stable on ARB (no cough-->only with ACEi) . Latest Scr appears at baseline  1.2-1.3   Plan: -I will close this case and alert [facility] CM team members.  I'm happy to address needs in the future as they arise.  Regina Eck, PharmD, [place]  [phone]
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
## 2018-04-16 ENCOUNTER — Ambulatory Visit: Payer: Medicare Other

## 2018-04-18 ENCOUNTER — Other Ambulatory Visit: Payer: Self-pay | Admitting: Physical Medicine & Rehabilitation

## 2018-04-18 ENCOUNTER — Other Ambulatory Visit: Payer: Self-pay | Admitting: Internal Medicine

## 2018-04-21 ENCOUNTER — Ambulatory Visit (INDEPENDENT_AMBULATORY_CARE_PROVIDER_SITE_OTHER): Payer: Medicare Other | Admitting: Internal Medicine

## 2018-04-21 ENCOUNTER — Encounter: Payer: Self-pay | Admitting: Internal Medicine

## 2018-04-21 ENCOUNTER — Other Ambulatory Visit: Payer: Self-pay

## 2018-04-21 ENCOUNTER — Other Ambulatory Visit: Payer: Self-pay | Admitting: *Deleted

## 2018-04-21 VITALS — BP 158/59 | HR 79 | Temp 98.0°F | Ht 64.0 in | Wt 231.1 lb

## 2018-04-21 DIAGNOSIS — Z794 Long term (current) use of insulin: Secondary | ICD-10-CM | POA: Diagnosis not present

## 2018-04-21 DIAGNOSIS — N189 Chronic kidney disease, unspecified: Secondary | ICD-10-CM | POA: Diagnosis not present

## 2018-04-21 DIAGNOSIS — E1122 Type 2 diabetes mellitus with diabetic chronic kidney disease: Secondary | ICD-10-CM

## 2018-04-21 DIAGNOSIS — I152 Hypertension secondary to endocrine disorders: Secondary | ICD-10-CM

## 2018-04-21 DIAGNOSIS — I159 Secondary hypertension, unspecified: Secondary | ICD-10-CM

## 2018-04-21 DIAGNOSIS — I10 Essential (primary) hypertension: Secondary | ICD-10-CM

## 2018-04-21 DIAGNOSIS — H538 Other visual disturbances: Secondary | ICD-10-CM

## 2018-04-21 DIAGNOSIS — I129 Hypertensive chronic kidney disease with stage 1 through stage 4 chronic kidney disease, or unspecified chronic kidney disease: Secondary | ICD-10-CM | POA: Diagnosis not present

## 2018-04-21 DIAGNOSIS — E11319 Type 2 diabetes mellitus with unspecified diabetic retinopathy without macular edema: Secondary | ICD-10-CM

## 2018-04-21 DIAGNOSIS — Z79899 Other long term (current) drug therapy: Secondary | ICD-10-CM | POA: Diagnosis not present

## 2018-04-21 DIAGNOSIS — E113593 Type 2 diabetes mellitus with proliferative diabetic retinopathy without macular edema, bilateral: Secondary | ICD-10-CM

## 2018-04-21 DIAGNOSIS — E1159 Type 2 diabetes mellitus with other circulatory complications: Secondary | ICD-10-CM

## 2018-04-21 MED ORDER — LOSARTAN POTASSIUM-HCTZ 100-12.5 MG PO TABS: 1.0000 | ORAL_TABLET | Freq: Every day | ORAL | 5 refills | Status: DC
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
## 2018-04-21 NOTE — Assessment & Plan Note (Signed)
BP Readings from Last 3 Encounters:  04/21/18 (!) 158/59  04/10/18 (!) 155/64  04/09/18 (!) 154/80   She brought in her BP monitor today and although blood pressures are somewhat improved after increase in spironolactone to 50mg  qd, they continue to be elevated. She was previously on HCTZ but this was stopped due to concern for AKI. Her GFR continues to be decreased, and her CKD is likely secondary to her uncontrolled ongoing hypertension.   - renal US with doppler  - start Hyzaar - losartan 100mg  -HCTZ 12.5 mg qd - was previously on losartan 100 mg alone - cont. hydral 25 q8h and verapamil 240 mg qd - f/u two weeks  - BMP - renin/aldo ratio - if abnormal further workup with d/c of spironolactone will be necessary

## 2018-04-21 NOTE — Assessment & Plan Note (Signed)
She brought her glucometer with her today and glucose ranges are from 110 - 180. Last Hgb a1c was around 6.3 She has no symptoms of hypoglycemia and has been taking 18U lantus qd which was decreased due to hypoglycemia on CGM.   - cont. Current medications

## 2018-04-21 NOTE — Patient Instructions (Signed)
Thank you for allowing [place] to provide your care today. Today we discussed your high blood pressure and chronic kidney disease.     I have ordered the following labs for you:  Basic metabolic panel   I will call if any are abnormal.    Today we made the following changes to your medications:   Please START taking:  Losartan-hydrochlorothiazide (HYZAAR) 100-12.5 MG tablet - take one tablet per day in the morning   Please STOP taking   Losartan 100 MG tablet   Please follow-up with your appointment for your renal ultrasound.   Please follow-up in two weeks.    Should you have any questions or concerns please call the internal medicine clinic at [phone].

## 2018-04-22 MED ORDER — ALBUTEROL SULFATE HFA 108 (90 BASE) MCG/ACT IN AERS: INHALATION_SPRAY | RESPIRATORY_TRACT | 3 refills | Status: DC

## 2018-04-23 NOTE — Progress Notes (Signed)
Internal Medicine Clinic Attending  Case discussed with Dr. Seawell at the time of the visit.  We reviewed the resident's history and exam and pertinent patient test results.  I agree with the assessment, diagnosis, and plan of care documented in the resident's note.  Alexander Raines, M.D., Ph.D.  

## 2018-04-30 LAB — BMP8+ANION GAP
Anion Gap: 18 mmol/L (ref 10.0–18.0)
BUN/Creatinine Ratio: 26 (ref 12–28)
BUN: 40 mg/dL — ABNORMAL HIGH (ref 8–27)
CO2: 19 mmol/L — ABNORMAL LOW (ref 20–29)
Calcium: 8.8 mg/dL (ref 8.7–10.3)
Chloride: 108 mmol/L — ABNORMAL HIGH (ref 96–106)
Creatinine, Ser: 1.55 mg/dL — ABNORMAL HIGH (ref 0.57–1.00)
GFR calc Af Amer: 39 mL/min/{1.73_m2} — ABNORMAL LOW (ref >59)
GFR calc non Af Amer: 34 mL/min/{1.73_m2} — ABNORMAL LOW (ref >59)
Glucose: 124 mg/dL — ABNORMAL HIGH (ref 65–99)
Potassium: 5 mmol/L (ref 3.5–5.2)
Sodium: 145 mmol/L — ABNORMAL HIGH (ref 134–144)

## 2018-04-30 LAB — ALDOSTERONE + RENIN ACTIVITY W/ RATIO
ALDOSTERONE: <1 ng/dL (ref 0.0–30.0)
Renin: 3.715 ng/mL/hr (ref 0.167–5.380)

## 2018-05-05 ENCOUNTER — Ambulatory Visit: Payer: Medicare Other

## 2018-05-06 ENCOUNTER — Other Ambulatory Visit: Payer: Self-pay | Admitting: *Deleted

## 2018-05-06 NOTE — Patient Outreach (Signed)
[facility]) Care Management  05/06/2018  Chelsea [name] [birth year] [id number]   Call placed to member to follow up on hypertension management, no answer.  HIPAA compliant voice message left.  Will follow up within the next 3-4 business days.  Valente David, [place], MSN [place] [phone]

## 2018-05-09 ENCOUNTER — Other Ambulatory Visit: Payer: Self-pay | Admitting: *Deleted

## 2018-05-09 ENCOUNTER — Ambulatory Visit (HOSPITAL_COMMUNITY): Payer: Medicare Other

## 2018-05-09 NOTE — Patient Outreach (Signed)
[facility]) Care Management  05/09/2018  Chelsea [name] 08-30-1950 [id number]   Second attempt made to contact member about blood pressure management, no answer.  HIPAA compliant voice message left.  Unsuccessful outreach letter sent, will follow up within the next 3-4 business days.  Valente David, [place], MSN [place] [phone]

## 2018-05-12 ENCOUNTER — Telehealth: Payer: Self-pay | Admitting: Dietician

## 2018-05-12 NOTE — Telephone Encounter (Signed)
-----   Message from Forde Dandy, PharmD sent at 12/13/2016 10:54 PM EDT ----- Regarding: Geriatrics Task Force Patient due for follow up call

## 2018-05-12 NOTE — Telephone Encounter (Signed)
Chelsea [name] is a 68 y.o. female who was contacted on behalf of [facility] Geriatrics Task Force.   Checking feet? Daughter looks at them.   Diabetes Assessment  DM meds and BS checks -  "What medications are you taking for diabetes?" 18 units lantus qam -  "How often do you check your blood sugars at home?" 4x/day - "What have your blood sugars been?" 104/145/143/110/160  High Blood Pressure Assessment  BP meds & BP checks -  "What medications are you taking for high blood pressure?" hyzar,calan spironolactone 1x/day - "How often do you check your blood pressure at home?"she doesn't check at home- sometimes aide checks 2-3 times/week - "What have your blood pressure readings been?" 150/50- trying to get it lower  Coping with DM and BP - What else are you doing to help with your DM and BP - Diet? No salt, cutting down on sweets, a lot of vegetables, fruit daily Exercise? Not like I should trying to do more  Medication Access Issues  Medication Issues? -  "Are you getting your medicines refilled on time without skipping any doses?" yes - "Are you having any problems getting/taking your meds (cost, timing, transportation)?" no - Do you need any meds refilled? no  Conclusion  Close the call - Date of follow-up visit scheduled reviewer upcoming appointments - "Any other questions or concerns?" no- glad you called  working on scheduling hr renal ultrasound. Debera Lat, RD 05/12/2018 2:45 PM.

## 2018-05-14 ENCOUNTER — Other Ambulatory Visit: Payer: Self-pay | Admitting: *Deleted

## 2018-05-14 ENCOUNTER — Other Ambulatory Visit: Payer: Self-pay | Admitting: Internal Medicine

## 2018-05-14 ENCOUNTER — Encounter: Payer: Medicare Other | Admitting: Physical Medicine & Rehabilitation

## 2018-05-14 DIAGNOSIS — N183 Chronic kidney disease, stage 3 unspecified: Secondary | ICD-10-CM

## 2018-05-14 DIAGNOSIS — E1159 Type 2 diabetes mellitus with other circulatory complications: Secondary | ICD-10-CM

## 2018-05-14 DIAGNOSIS — I152 Hypertension secondary to endocrine disorders: Secondary | ICD-10-CM

## 2018-05-14 DIAGNOSIS — I10 Essential (primary) hypertension: Secondary | ICD-10-CM

## 2018-05-14 NOTE — Addendum Note (Signed)
Addended by: Carroll Sage on: 05/14/2018 11:19 AM   Modules accepted: Orders

## 2018-05-14 NOTE — Patient Outreach (Signed)
[facility]) Care Management  05/14/2018  LITZI BINNING 1950/04/30 [id number]   Third attempt to contact member regarding hypertension management, unsuccessful.  Unable to leave message as mailbox is full.  If no response back by 4/7, will close case due to inability to maintain contact.  Valente David, [place], MSN [place] [phone]

## 2018-05-15 ENCOUNTER — Ambulatory Visit: Payer: Medicare Other | Admitting: Registered Nurse

## 2018-05-17 ENCOUNTER — Other Ambulatory Visit: Payer: Self-pay | Admitting: Internal Medicine

## 2018-05-17 DIAGNOSIS — E113593 Type 2 diabetes mellitus with proliferative diabetic retinopathy without macular edema, bilateral: Secondary | ICD-10-CM

## 2018-05-17 DIAGNOSIS — Z794 Long term (current) use of insulin: Principal | ICD-10-CM

## 2018-05-17 DIAGNOSIS — I10 Essential (primary) hypertension: Secondary | ICD-10-CM

## 2018-05-17 DIAGNOSIS — I152 Hypertension secondary to endocrine disorders: Secondary | ICD-10-CM

## 2018-05-17 DIAGNOSIS — E1159 Type 2 diabetes mellitus with other circulatory complications: Secondary | ICD-10-CM

## 2018-05-21 ENCOUNTER — Other Ambulatory Visit: Payer: Self-pay | Admitting: *Deleted

## 2018-05-21 NOTE — Patient Outreach (Signed)
[facility]) Care Management  05/21/2018  JOLYNE LAYE [birth year] [id number]   Call received from member on 05/20/2018 stating she has new number, very difficult to hear through connection, advised that this care manager will call back.  Number retrieved from recent call log, call placed to member today to follow up on hypertension management.  Unfortunately call unsuccessful, unable to leave a voice message.  This makes 4th unsuccessful attempt.  Will close case at this time.  Will notify primary MD as well as send member case closure letter.    Valente David, [place], MSN [place] [phone]

## 2018-06-04 ENCOUNTER — Telehealth: Payer: Self-pay

## 2018-06-04 ENCOUNTER — Encounter: Payer: Medicare Other | Attending: Physical Medicine & Rehabilitation | Admitting: Physical Medicine & Rehabilitation

## 2018-06-04 ENCOUNTER — Other Ambulatory Visit: Payer: Self-pay

## 2018-06-04 ENCOUNTER — Encounter: Payer: Self-pay | Admitting: Physical Medicine & Rehabilitation

## 2018-06-04 VITALS — BP 103/38 | Ht 64.0 in | Wt 231.0 lb

## 2018-06-04 DIAGNOSIS — M170 Bilateral primary osteoarthritis of knee: Secondary | ICD-10-CM | POA: Diagnosis not present

## 2018-06-04 DIAGNOSIS — J4540 Moderate persistent asthma, uncomplicated: Secondary | ICD-10-CM | POA: Diagnosis not present

## 2018-06-04 DIAGNOSIS — I639 Cerebral infarction, unspecified: Secondary | ICD-10-CM | POA: Diagnosis not present

## 2018-06-04 MED ORDER — DICLOFENAC SODIUM 1 % TD GEL: 2.0000 g | Freq: Three times a day (TID) | TRANSDERMAL | 4 refills | Status: DC

## 2018-06-04 MED ORDER — DICLOFENAC SODIUM 1 % TD GEL: 1.0000 "application " | Freq: Three times a day (TID) | TRANSDERMAL | 4 refills | Status: DC

## 2018-06-04 MED ORDER — ALBUTEROL SULFATE HFA 108 (90 BASE) MCG/ACT IN AERS: INHALATION_SPRAY | RESPIRATORY_TRACT | 3 refills | Status: DC
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
## 2018-06-04 NOTE — Telephone Encounter (Signed)
Sent new rx with amount.  thx

## 2018-06-04 NOTE — Telephone Encounter (Signed)
Chelsea [name] from [facility] called stating that for the Diclofenac Sodium gel need to know for billing purposes needs to know how much applying to each area.

## 2018-06-06 ENCOUNTER — Encounter: Payer: Self-pay | Admitting: Gastroenterology

## 2018-06-12 ENCOUNTER — Telehealth: Payer: Self-pay | Admitting: Dietician

## 2018-06-12 NOTE — Telephone Encounter (Signed)
Chelsea [name] is a 68 y.o. female who was contacted on behalf of [facility] Geriatrics Task Force.   Diabetes Assessment  DM meds and BS checks -  "What medications are you taking for diabetes?" metformin, lantus 18 units -  "How often do you check your blood sugars at home?" once a day - "What have your blood sugars been?" 144 today, 130-133  High Blood Pressure Assessment  BP meds & BP checks -  "What medications are you taking for high blood pressure?"  Verapamil, spironolactone, hyzaar, hydrolaine - "How often do you check your blood pressure at home?" ho,e health helps her - "What have your blood pressure readings been?"   Coping with DM and BP - What else are you doing to help with your DM and BP - Diet? Salads, spaghetti, sweet potatoes, has enough of everything, plenty of water, sparkling water   - Exercise? Watches tv, reads, leg exercise, walks with walker washes dishes, daughter and grandchildren help her.  Medication Access Issues  Medication Issues? -  "Are you getting your medicines refilled on time without skipping any doses?" yes- family fills her pillbox for a week - "Are you having any problems getting/taking your meds (cost, timing, transportation)?" no - Do you need any meds refilled? no  Conclusion  Close the call - Date of follow-up visit scheduled" she says she was told we'd call her in June - "Any other questions or concerns?" no   denies having advanced directives- says children know what she wants.  Debera Lat, RD 06/12/2018 11:36 AM.

## 2018-06-19 ENCOUNTER — Other Ambulatory Visit: Payer: Self-pay | Admitting: *Deleted

## 2018-06-19 MED ORDER — ATORVASTATIN CALCIUM 80 MG PO TABS: 80.0000 mg | ORAL_TABLET | Freq: Every day | ORAL | 3 refills | Status: DC

## 2018-06-20 ENCOUNTER — Ambulatory Visit (HOSPITAL_COMMUNITY): Payer: Medicare Other

## 2018-07-15 ENCOUNTER — Encounter: Payer: Self-pay | Admitting: Internal Medicine

## 2018-07-15 ENCOUNTER — Ambulatory Visit (INDEPENDENT_AMBULATORY_CARE_PROVIDER_SITE_OTHER): Payer: Medicare Other | Admitting: Internal Medicine

## 2018-07-15 ENCOUNTER — Other Ambulatory Visit: Payer: Self-pay

## 2018-07-15 ENCOUNTER — Ambulatory Visit (HOSPITAL_COMMUNITY)
Admission: RE | Admit: 2018-07-15 | Discharge: 2018-07-15 | Disposition: A | Discharge status: Home / Self Care | Payer: Medicare Other | Source: Ambulatory Visit | Attending: Internal Medicine | Admitting: Internal Medicine

## 2018-07-15 VITALS — BP 118/39 | HR 76 | Temp 98.0°F | Ht 64.0 in | Wt 225.1 lb

## 2018-07-15 DIAGNOSIS — Z79899 Other long term (current) drug therapy: Secondary | ICD-10-CM

## 2018-07-15 DIAGNOSIS — E1159 Type 2 diabetes mellitus with other circulatory complications: Secondary | ICD-10-CM

## 2018-07-15 DIAGNOSIS — Z7982 Long term (current) use of aspirin: Secondary | ICD-10-CM | POA: Diagnosis not present

## 2018-07-15 DIAGNOSIS — I152 Hypertension secondary to endocrine disorders: Secondary | ICD-10-CM

## 2018-07-15 DIAGNOSIS — I10 Essential (primary) hypertension: Secondary | ICD-10-CM | POA: Diagnosis not present

## 2018-07-15 DIAGNOSIS — N3944 Nocturnal enuresis: Secondary | ICD-10-CM

## 2018-07-15 LAB — BASIC METABOLIC PANEL
Anion gap: 7 (ref 5–15)
BUN: 45 mg/dL — ABNORMAL HIGH (ref 8–23)
CO2: 21 mmol/L — ABNORMAL LOW (ref 22–32)
Calcium: 9.1 mg/dL (ref 8.9–10.3)
Chloride: 112 mmol/L — ABNORMAL HIGH (ref 98–111)
Creatinine, Ser: 1.72 mg/dL — ABNORMAL HIGH (ref 0.44–1.00)
GFR calc Af Amer: 35 mL/min — ABNORMAL LOW (ref >60)
GFR calc non Af Amer: 30 mL/min — ABNORMAL LOW (ref >60)
Glucose, Bld: 94 mg/dL (ref 70–99)
Potassium: 5.3 mmol/L — ABNORMAL HIGH (ref 3.5–5.1)
Sodium: 140 mmol/L (ref 135–145)

## 2018-07-15 MED ORDER — VERAPAMIL HCL ER 180 MG PO TBCR: 180.0000 mg | EXTENDED_RELEASE_TABLET | Freq: Every day | ORAL | 2 refills | Status: DC
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
## 2018-07-15 NOTE — Patient Instructions (Signed)
Thank you for allowing [place] to care for you  For your High Blood pressure - Pressure was a little low today - We will reduce you dose of Verapamil from 240mg  to 180mg  - We are getting labs today to check your kidney function - You will be contacted with lab results or changes  Please follow up in about 1 month with PCP

## 2018-07-15 NOTE — Assessment & Plan Note (Addendum)
Patient has history of uncontrolled HTN, last seen in March 2020, BP was in the 150s at that time and HCTZ was added. She was meant to follow up sooner for recheck and labs but had not come in, will obtain labs today. BP today on the lower side at 118/39. This is consisted with reported BP for recent tele PM&R visit and her home values of 122/80 and 84/46. Given these low values, her age, and that she walks with a walker; I am concern for development of orthostatic hypotension (and non-orthostatic hypotension) and falls. I will pursue a less aggressive goal of 140/80 at this time and reduce Verapamil dose.  Renin/ALdo ordered at last visit WNL. Renal U/S ordered at last visit, not resulted, performed this morning per patient. BMP today to evaluate renal function on HCTZ, hx of ?AKI vs CKD. - Reduce Verapamil to 180mg  Daily - Continue Losartan-HCTZ 100-12.5mg  Daily - Continue Hydralazine 25mg  TID - BMP - Follow up with PCP in about 1 month

## 2018-07-16 ENCOUNTER — Ambulatory Visit (INDEPENDENT_AMBULATORY_CARE_PROVIDER_SITE_OTHER): Payer: Medicare Other | Admitting: Internal Medicine

## 2018-07-16 ENCOUNTER — Encounter: Payer: Self-pay | Admitting: Internal Medicine

## 2018-07-16 ENCOUNTER — Encounter: Payer: Self-pay | Admitting: Gastroenterology

## 2018-07-16 ENCOUNTER — Telehealth: Payer: Self-pay | Admitting: Internal Medicine

## 2018-07-16 DIAGNOSIS — I152 Hypertension secondary to endocrine disorders: Secondary | ICD-10-CM

## 2018-07-16 DIAGNOSIS — Z7982 Long term (current) use of aspirin: Secondary | ICD-10-CM | POA: Diagnosis not present

## 2018-07-16 DIAGNOSIS — Z79899 Other long term (current) drug therapy: Secondary | ICD-10-CM

## 2018-07-16 DIAGNOSIS — R350 Frequency of micturition: Secondary | ICD-10-CM

## 2018-07-16 DIAGNOSIS — N3944 Nocturnal enuresis: Secondary | ICD-10-CM | POA: Diagnosis not present

## 2018-07-16 DIAGNOSIS — E1159 Type 2 diabetes mellitus with other circulatory complications: Secondary | ICD-10-CM | POA: Diagnosis not present

## 2018-07-16 DIAGNOSIS — I10 Essential (primary) hypertension: Secondary | ICD-10-CM | POA: Diagnosis not present

## 2018-07-16 DIAGNOSIS — R3981 Functional urinary incontinence: Secondary | ICD-10-CM | POA: Insufficient documentation

## 2018-07-16 MED ORDER — LOSARTAN POTASSIUM 100 MG PO TABS: 100.0000 mg | ORAL_TABLET | Freq: Every day | ORAL | 0 refills | Status: DC

## 2018-07-16 NOTE — Telephone Encounter (Signed)
Pt is requesting a DME order for  Pull-ups.  Pt would like a call back.

## 2018-07-16 NOTE — Assessment & Plan Note (Signed)
HPI: Patient reports a history of nocturnal incontinence since 10/2017. She states that she does not wake up during the night with the urge to urinate, but she wakes up in the morning and is wet. She takes ramelteon before bed to help her sleep, but no other sedating agents. She has no issues with urinary incontinence during the day, but does endorse increased urinary frequency. She denies dysuria, hematuria, abdominal pain, back pain, and bowel incontinence. She drinks water frequently throughout the day and drinks at least a glassful of water just prior to bedtime. She does not drink caffeine or take any OTC medications. She believes she takes her hctz and spironolactone in the morning, but is unsure. She has had three uncomplicated vaginal deliveries in the past. Her most recent A1c was <7 and her blood sugars have ranged from 118 to 145 over the past few weeks.   Assessment: No suspicion for UTI, glucosuria, or neurogenic cause of incontinence. Most likely caused by a combination of diuretics including hctz and spironolactone, habits including water before bed, and normal aging. Other medications that may contribute are her losartan, verapamil, and spironolactone. She is not on any unnecessary anticholinergics.   Plan - Discontinue hctz - Encouraged patient to stop drinking water for at least 2 hours prior to bed - Encouraged patient to void completely prior to bed - DME for Depends

## 2018-07-16 NOTE — Telephone Encounter (Signed)
Patient states she has been experiencing nighttime urinary incontinence for 4 months. Requesting pull ups. Urinary incontinence is not on problem list and has not been addressed in recent visits. [facility] appt given today to disscuss need. Hubbard Hartshorn, RN, BSN

## 2018-07-16 NOTE — Telephone Encounter (Signed)
Completed incontinence supply order form along with OV notes and demographics faxed to [employer] at [phone]. Hubbard Hartshorn, RN, BSN

## 2018-07-16 NOTE — Assessment & Plan Note (Signed)
BMP drawn yesterday showed increased Cr at 1.7. Her Cr has ranged from 1.17 to 1.55 over the past year since resuming hctz. She was previously take off hctz in the setting of AKI, but the medication was later resumed as she has had mild improvement in kidney function and refractory HTN. Based on elevated Cr and urinary incontinence, will discontinue her hctz today. Discussed plan with Dr. Trilby Drummer who was the patient in [facility] yesterday.  Plan - Discontinue hctz - Continue losartan 100mg  daily - Resume verapamil 240mg  daily (instead of reduced dose of 180mg  daily as recommended yesterday prior to bmp) - Continue hydralazine 25mg  TID - Continue spironolactone 50mg  daily - Continue workup for secondary HTN per PCP (renal US has been ordered, but not yet performed) - Follow-up in 1-2 months for repeat BP and BMP

## 2018-07-16 NOTE — Telephone Encounter (Signed)
The note is complete. Should I expect the order form by email?

## 2018-07-16 NOTE — Telephone Encounter (Signed)
Okay, thanks! I'll continue to look out for it.

## 2018-07-16 NOTE — Telephone Encounter (Signed)
I can order pull ups but she needs to be seen in [facility] to have this worked-up.
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
## 2018-07-16 NOTE — Progress Notes (Signed)
Internal Medicine Clinic Attending  Case discussed with Dr. Melvin  at the time of the visit.  We reviewed the resident's history and exam and pertinent patient test results.  I agree with the assessment, diagnosis, and plan of care documented in the resident's note.  

## 2018-07-16 NOTE — Telephone Encounter (Signed)
Hi Lauren,  I just spoke with the patient and can sign the order form. Thanks!

## 2018-07-16 NOTE — Progress Notes (Signed)
Internal Medicine Clinic Attending  Case discussed with Dr. Dorrell at the time of the visit.  We reviewed the resident's history and exam and pertinent patient test results.  I agree with the assessment, diagnosis, and plan of care documented in the resident's note.    

## 2018-07-19 ENCOUNTER — Other Ambulatory Visit: Payer: Self-pay | Admitting: Physical Medicine & Rehabilitation

## 2018-07-19 ENCOUNTER — Other Ambulatory Visit: Payer: Self-pay | Admitting: Internal Medicine

## 2018-07-21 MED ORDER — CITALOPRAM HYDROBROMIDE 10 MG PO TABS: 10.0000 mg | ORAL_TABLET | Freq: Every day | ORAL | 2 refills | Status: DC

## 2018-07-21 NOTE — Addendum Note (Signed)
Addended by: [name] Mcalpine B on: 07/21/2018 09:16 AM   Modules accepted: Orders

## 2018-08-03 ENCOUNTER — Encounter: Payer: Self-pay | Admitting: *Deleted

## 2018-08-05 ENCOUNTER — Encounter: Payer: Medicare Other | Attending: Physical Medicine & Rehabilitation | Admitting: Physical Medicine & Rehabilitation

## 2018-08-05 ENCOUNTER — Other Ambulatory Visit: Payer: Self-pay

## 2018-08-05 ENCOUNTER — Encounter: Payer: Self-pay | Admitting: Physical Medicine & Rehabilitation

## 2018-08-05 VITALS — BP 123/70 | HR 88 | Temp 97.5°F | Ht 67.0 in | Wt 222.2 lb

## 2018-08-05 DIAGNOSIS — F329 Major depressive disorder, single episode, unspecified: Secondary | ICD-10-CM

## 2018-08-05 DIAGNOSIS — I639 Cerebral infarction, unspecified: Secondary | ICD-10-CM | POA: Diagnosis not present

## 2018-08-05 DIAGNOSIS — M170 Bilateral primary osteoarthritis of knee: Secondary | ICD-10-CM | POA: Diagnosis not present

## 2018-08-05 MED ORDER — CITALOPRAM HYDROBROMIDE 20 MG PO TABS: 20.0000 mg | ORAL_TABLET | Freq: Every day | ORAL | 4 refills | Status: DC
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
## 2018-08-05 NOTE — Patient Instructions (Signed)
INCREASE CELEXA TO 20MG  NIGHTLY

## 2018-08-06 ENCOUNTER — Encounter: Payer: Self-pay | Admitting: Podiatry

## 2018-08-06 ENCOUNTER — Ambulatory Visit (INDEPENDENT_AMBULATORY_CARE_PROVIDER_SITE_OTHER): Payer: Medicare Other | Admitting: Podiatry

## 2018-08-06 DIAGNOSIS — M79674 Pain in right toe(s): Secondary | ICD-10-CM

## 2018-08-06 DIAGNOSIS — Q828 Other specified congenital malformations of skin: Secondary | ICD-10-CM | POA: Insufficient documentation

## 2018-08-06 DIAGNOSIS — E118 Type 2 diabetes mellitus with unspecified complications: Secondary | ICD-10-CM

## 2018-08-06 DIAGNOSIS — M79675 Pain in left toe(s): Secondary | ICD-10-CM | POA: Diagnosis not present

## 2018-08-06 DIAGNOSIS — B351 Tinea unguium: Secondary | ICD-10-CM | POA: Insufficient documentation

## 2018-08-06 NOTE — Progress Notes (Signed)
[name], female   DOB: 1950/07/01, 68 y.o.   MRN: [id number] Complaint:  Visit Type: Patient returns to my office for continued preventative foot care services. Complaint: Patient states" my nails have grown long and thick and become painful to walk and wear shoes" Patient has been diagnosed with DM with neuropathy He presents for preventative foot care services. No changes to ROS.  Painful callus under the balls of left foot. Patient has not been seen in over 1 year.  She presents to the office with her daughter-in-law.  Podiatric Exam: Vascular: dorsalis pedis and posterior tibial pulses are palpable bilateral. Capillary return is immediate. Temperature gradient is WNL. Skin turgor WNL  Sensorium: Diminished Semmes Weinstein monofilament test. Normal tactile sensation bilaterally. Nail Exam: Pt has thick disfigured discolored nails with subungual debris noted bilateral entire nail hallux through fifth toenails Ulcer Exam: There is no evidence of ulcer or pre-ulcerative changes or infection. Orthopedic Exam: Muscle tone and strength are WNL. No limitations in general ROM. No crepitus or effusions noted. Foot type and digits show no abnormalities. DJD 1st MPJ B/L. Skin:  Porokeratosis sib 1,3 left and sub 5 right. No infection or ulcers.    Diagnosis:  Tinea unguium, Pain in right toe, pain in left toes  porokeratosis  Treatment & Plan Procedures and Treatment: Consent by patient was obtained for treatment procedures. The patient understood the discussion of treatment and procedures well. All questions were answered thoroughly reviewed. Debridement of mycotic and hypertrophic toenails, 1 through 5 bilateral and clearing of subungual debris. No ulceration, no infection noted. Debride porokeratosis  left .Return Visit-Office Procedure: Patient instructed to return to the office for a follow up visit 3 months for continued evaluation and treatment.   Gardiner Barefoot DPM

## 2018-08-13 ENCOUNTER — Other Ambulatory Visit: Payer: Self-pay | Admitting: Internal Medicine

## 2018-08-13 ENCOUNTER — Other Ambulatory Visit: Payer: Self-pay | Admitting: *Deleted

## 2018-08-13 DIAGNOSIS — Z794 Long term (current) use of insulin: Secondary | ICD-10-CM

## 2018-08-13 DIAGNOSIS — E1159 Type 2 diabetes mellitus with other circulatory complications: Secondary | ICD-10-CM

## 2018-08-13 DIAGNOSIS — E113593 Type 2 diabetes mellitus with proliferative diabetic retinopathy without macular edema, bilateral: Secondary | ICD-10-CM

## 2018-08-13 DIAGNOSIS — I152 Hypertension secondary to endocrine disorders: Secondary | ICD-10-CM

## 2018-08-13 MED ORDER — METFORMIN HCL ER 500 MG PO TB24: ORAL_TABLET | ORAL | 2 refills | Status: DC

## 2018-08-13 MED ORDER — HYDRALAZINE HCL 25 MG PO TABS: 25.0000 mg | ORAL_TABLET | Freq: Three times a day (TID) | ORAL | 2 refills | Status: DC

## 2018-08-13 MED ORDER — SPIRONOLACTONE 50 MG PO TABS: 50.0000 mg | ORAL_TABLET | Freq: Every day | ORAL | 2 refills | Status: DC

## 2018-08-28 ENCOUNTER — Encounter: Payer: Self-pay | Admitting: Internal Medicine

## 2018-08-28 ENCOUNTER — Other Ambulatory Visit: Payer: Self-pay

## 2018-08-28 ENCOUNTER — Ambulatory Visit (INDEPENDENT_AMBULATORY_CARE_PROVIDER_SITE_OTHER): Payer: Medicare Other | Admitting: Internal Medicine

## 2018-08-28 VITALS — BP 131/79 | HR 77 | Temp 98.2°F | Wt 220.3 lb

## 2018-08-28 DIAGNOSIS — E785 Hyperlipidemia, unspecified: Secondary | ICD-10-CM

## 2018-08-28 DIAGNOSIS — I152 Hypertension secondary to endocrine disorders: Secondary | ICD-10-CM

## 2018-08-28 DIAGNOSIS — E1169 Type 2 diabetes mellitus with other specified complication: Secondary | ICD-10-CM | POA: Diagnosis not present

## 2018-08-28 DIAGNOSIS — M1990 Unspecified osteoarthritis, unspecified site: Secondary | ICD-10-CM | POA: Diagnosis not present

## 2018-08-28 DIAGNOSIS — N183 Chronic kidney disease, stage 3 unspecified: Secondary | ICD-10-CM

## 2018-08-28 DIAGNOSIS — Z79899 Other long term (current) drug therapy: Secondary | ICD-10-CM

## 2018-08-28 DIAGNOSIS — Z794 Long term (current) use of insulin: Secondary | ICD-10-CM | POA: Diagnosis not present

## 2018-08-28 DIAGNOSIS — I129 Hypertensive chronic kidney disease with stage 1 through stage 4 chronic kidney disease, or unspecified chronic kidney disease: Secondary | ICD-10-CM | POA: Diagnosis not present

## 2018-08-28 DIAGNOSIS — Z7982 Long term (current) use of aspirin: Secondary | ICD-10-CM | POA: Diagnosis not present

## 2018-08-28 DIAGNOSIS — E1122 Type 2 diabetes mellitus with diabetic chronic kidney disease: Secondary | ICD-10-CM

## 2018-08-28 DIAGNOSIS — I69351 Hemiplegia and hemiparesis following cerebral infarction affecting right dominant side: Secondary | ICD-10-CM

## 2018-08-28 DIAGNOSIS — I6340 Cerebral infarction due to embolism of unspecified cerebral artery: Secondary | ICD-10-CM

## 2018-08-28 DIAGNOSIS — E113593 Type 2 diabetes mellitus with proliferative diabetic retinopathy without macular edema, bilateral: Secondary | ICD-10-CM | POA: Diagnosis not present

## 2018-08-28 DIAGNOSIS — E11319 Type 2 diabetes mellitus with unspecified diabetic retinopathy without macular edema: Secondary | ICD-10-CM | POA: Diagnosis not present

## 2018-08-28 DIAGNOSIS — E1159 Type 2 diabetes mellitus with other circulatory complications: Secondary | ICD-10-CM

## 2018-08-28 LAB — GLUCOSE, CAPILLARY: Glucose-Capillary: 99 mg/dL (ref 70–99)

## 2018-08-28 LAB — POCT GLYCOSYLATED HEMOGLOBIN (HGB A1C): Hemoglobin A1C: 5.8 % — AB (ref 4.0–5.6)
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
## 2018-08-28 NOTE — Assessment & Plan Note (Signed)
She does exhibit some residual weakness.  She will be starting PT

## 2018-08-28 NOTE — Assessment & Plan Note (Signed)
Labs from June reviewed.  GFR between 30-35.  There is a gradual increase in her creatinine. Plan: We will continue to monitor.  Repeat labs in 3 months.

## 2018-08-28 NOTE — Assessment & Plan Note (Signed)
Medications: Metformin 500 mg daily.  Lantus 18 units daily. A1c 5.8 today.  Clytee continues work on her diet and reports that this is been improving over the last few months.  She is Advertising account executive and encouraged.  Plan: Continue current regimen.  Follow-up in 3 months.

## 2018-08-28 NOTE — Assessment & Plan Note (Signed)
Atorvastatin 80 mg.  Denies any issues with myalgias with this.  Plan: Continue current regimen.

## 2018-08-28 NOTE — Assessment & Plan Note (Signed)
Currently on hydralazine 25 mg 3 times daily.  Hyzar 100-12.5 mg daily.  Spironolactone 50 mg daily.  Verapamil 180 mg daily. Plan: Blood pressures look good today.  We will continue with current regimen.

## 2018-08-28 NOTE — Patient Instructions (Addendum)
Your A1C is 5.8 today! Great work--keep it up! Please follow up with me in 6 mo or sooner if our clinic can assist you in any way!  Please also schedule your colonoscopy and eye exam like we discussed.

## 2018-08-29 NOTE — Progress Notes (Signed)
Internal Medicine Clinic Attending  I saw and evaluated the patient.  I personally confirmed the key portions of the history and exam documented by Dr. Christian   and I reviewed pertinent patient test results.  The assessment, diagnosis, and plan were formulated together and I agree with the documentation in the resident's note.  

## 2018-09-02 ENCOUNTER — Telehealth: Payer: Self-pay | Admitting: Internal Medicine

## 2018-09-02 NOTE — Telephone Encounter (Signed)
Pt is calling about incontinent supplies [phone]

## 2018-09-02 NOTE — Telephone Encounter (Signed)
Returning phone call to patient about incontience supplies,I LVM for patient Chelsea [name] C7/21/20202:16 PM

## 2018-09-02 NOTE — Telephone Encounter (Signed)
ILVM for patient to call me about supplies, I spoke to Angie with [employer]  And they had been trying to get in touch with her,I see they had the old number.Patient can call [employer] directly which I will give patient the information when she calls me back.[phone] WHK:[id number] Silverio Decamp C7/21/20204:54 PM

## 2018-09-03 NOTE — Telephone Encounter (Signed)
Patient returned phone call information given to her [place], [place] C7/22/20209:31 AM

## 2018-09-15 ENCOUNTER — Other Ambulatory Visit: Payer: Self-pay | Admitting: Internal Medicine

## 2018-09-15 ENCOUNTER — Telehealth: Payer: Self-pay | Admitting: Dietician

## 2018-09-15 DIAGNOSIS — E1159 Type 2 diabetes mellitus with other circulatory complications: Secondary | ICD-10-CM

## 2018-09-15 DIAGNOSIS — I152 Hypertension secondary to endocrine disorders: Secondary | ICD-10-CM

## 2018-09-16 ENCOUNTER — Other Ambulatory Visit: Payer: Self-pay | Admitting: Internal Medicine

## 2018-09-25 NOTE — Telephone Encounter (Signed)
Unable to reach by phone.  Debera Lat, RD 09/25/2018 4:52 PM.

## 2018-09-29 NOTE — Telephone Encounter (Signed)
Chelsea [name] is a 68 y.o. female who was contacted on behalf of [facility].   Diabetes Assessment  DM meds and BS checks -  "What medications are you taking for diabetes?" shot once a day 18units daily -  "How often do you check your blood sugars at home?" 2x/day - "What have your blood sugars been?" 99-90-100  High Blood Pressure Assessment  BP meds & BP checks -  "What medications are you taking for high blood pressure?" as prescribed - "How often do you check your blood pressure at home?" now and then - "What have your blood pressure readings been?" good  Coping with DM and BP - What else are you doing to help with your DM and BP - Diet? Not as hungry, cut out sweets, lettuce, eggs, cheerios, milk, meat- baked chicken, fish, wt Down to 222  - Exercise? Yes, walks with walker, son helps her  Medication Access Issues  Medication Issues? -  "Are you getting your medicines refilled on time without skipping any doses?" yes - "Are you having any problems getting/taking your meds (cost, timing, transportation)?" no - Do you need any meds refilled? no  Conclusion  Close the call - Date of follow-up visit scheduled- october - "Any other questions or concerns?" no  Eye - trying to coordinate with her family to schedule with Dr. [place] Donna Plyler, RD 09/29/2018 2:18 PM.

## 2018-10-02 ENCOUNTER — Telehealth: Payer: Self-pay | Admitting: Dietician

## 2018-10-02 NOTE — Telephone Encounter (Signed)
Appointment scheduled for CGM per Dr. Darrick Meigs.

## 2018-10-09 ENCOUNTER — Encounter: Payer: Self-pay | Admitting: Adult Health

## 2018-10-09 ENCOUNTER — Ambulatory Visit (INDEPENDENT_AMBULATORY_CARE_PROVIDER_SITE_OTHER): Payer: Medicare Other | Admitting: Adult Health

## 2018-10-09 ENCOUNTER — Telehealth: Payer: Self-pay | Admitting: Adult Health

## 2018-10-09 ENCOUNTER — Other Ambulatory Visit: Payer: Self-pay

## 2018-10-09 VITALS — BP 154/72 | HR 83 | Temp 98.0°F | Ht 64.0 in | Wt 216.4 lb

## 2018-10-09 DIAGNOSIS — E785 Hyperlipidemia, unspecified: Secondary | ICD-10-CM | POA: Diagnosis not present

## 2018-10-09 DIAGNOSIS — E113593 Type 2 diabetes mellitus with proliferative diabetic retinopathy without macular edema, bilateral: Secondary | ICD-10-CM

## 2018-10-09 DIAGNOSIS — E1169 Type 2 diabetes mellitus with other specified complication: Secondary | ICD-10-CM

## 2018-10-09 DIAGNOSIS — E1159 Type 2 diabetes mellitus with other circulatory complications: Secondary | ICD-10-CM | POA: Diagnosis not present

## 2018-10-09 DIAGNOSIS — I69319 Unspecified symptoms and signs involving cognitive functions following cerebral infarction: Secondary | ICD-10-CM

## 2018-10-09 DIAGNOSIS — R269 Unspecified abnormalities of gait and mobility: Secondary | ICD-10-CM

## 2018-10-09 DIAGNOSIS — I152 Hypertension secondary to endocrine disorders: Secondary | ICD-10-CM

## 2018-10-09 DIAGNOSIS — I10 Essential (primary) hypertension: Secondary | ICD-10-CM

## 2018-10-09 DIAGNOSIS — Z794 Long term (current) use of insulin: Secondary | ICD-10-CM

## 2018-10-09 DIAGNOSIS — I63412 Cerebral infarction due to embolism of left middle cerebral artery: Secondary | ICD-10-CM

## 2018-10-09 NOTE — Telephone Encounter (Signed)
Pt has called stating she was told by physical therapy that the referral has expired  And another one is needed

## 2018-10-09 NOTE — Telephone Encounter (Addendum)
Referral for physical therapy was placed by Dr. Naaman Plummer at [facility] for bilateral knee pain secondary to arthritis as well as ongoing gait difficulty from cerebellar stroke.  It appears as though [facility] referral does not expire until 04/2019 but would recommend for patient to contact physical medicine and rehab office for further questions regarding therapy.

## 2018-10-09 NOTE — Telephone Encounter (Signed)
I called neuro rehab and spoke to [facility].  He relayed that PT order was primarily for knee.  (even though dx stroke was in order).  Since pt seen for stroke by JM/NP we will need to order for stroke (specify which therapy's).
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
## 2018-10-09 NOTE — Patient Instructions (Signed)
Continue aspirin 81 mg daily  and Lipitor 80 for secondary stroke prevention  Continue to follow up with PCP regarding cholesterol, blood pressure and diabetes management   Continue to monitor blood pressure at home  Maintain strict control of hypertension with blood pressure goal below 130/90, diabetes with hemoglobin A1c goal below 6.5% and cholesterol with LDL cholesterol (bad cholesterol) goal below 70 mg/dL. I also advised the patient to eat a healthy diet with plenty of whole grains, cereals, fruits and vegetables, exercise regularly and maintain ideal body weight.        Thank you for coming to see [place] at [facility]. I hope we have been able to provide you high quality care today.  You may receive a patient satisfaction survey over the next few weeks. We would appreciate your feedback and comments so that we may continue to improve ourselves and the health of our patients.

## 2018-10-10 NOTE — Progress Notes (Signed)
I agree with the above plan 

## 2018-10-13 ENCOUNTER — Other Ambulatory Visit: Payer: Self-pay

## 2018-10-13 ENCOUNTER — Ambulatory Visit (INDEPENDENT_AMBULATORY_CARE_PROVIDER_SITE_OTHER): Payer: Medicare Other | Admitting: Dietician

## 2018-10-13 ENCOUNTER — Encounter: Payer: Self-pay | Admitting: Dietician

## 2018-10-13 DIAGNOSIS — Z794 Long term (current) use of insulin: Secondary | ICD-10-CM | POA: Diagnosis not present

## 2018-10-13 DIAGNOSIS — E113593 Type 2 diabetes mellitus with proliferative diabetic retinopathy without macular edema, bilateral: Secondary | ICD-10-CM | POA: Diagnosis not present

## 2018-10-13 NOTE — Patient Instructions (Signed)
Please record the time, amount and what food drinks and activities you have while wearing the continuous glucose monitor (CGM).  Bring the folder with you to follow up appointments. If your monitor falls off, please place it in the bag provided in your folder and bring it back with you to your next appointment.   Do not have a CT or an MRI while wearing the CGM.   1 week visit has been set up with me and a doctor for the first of two CGM downloads.   You will also return in 2 weeks to have your second download and the CGM removed.  Debera Lat, RD 10/13/2018 1:21 PM

## 2018-10-13 NOTE — Progress Notes (Signed)
Documentation for Freestyle Libre Pro Continuous glucose monitoring Freestyle Libre Pro CGM sensor placed today. Patient was educated about wearing sensor, keeping food, activity and medication log and when to call office. Patient was educated about how to care for the sensor and not to have an MRI, CT or Diathermy while wearing the sensor. Follow up was arranged with the patient for 1 week.   Ms. Roanhorse confirms 16 units Lantus every morning and 500 mg metformin er  Every morning. She denies signs or symptoms of hypoglycemia. Her meter download for past 30 days shows:  checks 0.9 times a day, all in am, range is 70-170 and average is 116  Lot #: [id number] Serial #: [id number] Expiration Date: 01/12/2019  Debera Lat, RD 10/13/2018 1:21 PM.

## 2018-10-13 NOTE — Telephone Encounter (Signed)
I called pt and was not able to communicate with her (like in tunnel) could not make out what was said.  I spoke to tara, daughter of pt.  I relayed that I called her mother and tried to speak to her bad connection ( like far away).  She will call and check on her mother.  She stated that pt did not have ST as outpt back  when ordered 04-10-18.  (only in hospital).  Daughter feels like pt needs ST (increasing drool, hard to get words out).  She felt like since 10-04-18 that pt is worse.  ? Doing another scan.  Sister in law was with pt when in 10-09-18.   Pt order in place already for knee, but if needs more then new order needs to be placed.  Please advise.

## 2018-10-13 NOTE — Telephone Encounter (Signed)
I called and spoke to daughter.  I relayed that JM/NP order placed for ST (cognitive and slurred speech).  Will receive PT from referral placed by Dr. Letta Pate for knee problems.  I relayed that if has acute sx of weakness, slurred speech, droop facial features, numbness/ tingling etc seek care at ED, or call 911. 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   I relayed to call back if needed.

## 2018-10-13 NOTE — Telephone Encounter (Signed)
Referral will be placed to speech therapy in regards to prior cognitive concerns but at recent visit, no concerns regarding worsening cognition and has been overall stable.  At prior visit on 04/10/2018, there was evidence of mild slurred speech with occasional drooling but per family at that time, this was her baseline prior to her stroke.  This was not discussed at most recent visit as no worsening from stroke and has been an ongoing issue.  If daughter believes symptoms have acutely worsened, would recommend proceeding to ED for further evaluation

## 2018-10-13 NOTE — Addendum Note (Signed)
Addended by: Mal Misty on: 10/13/2018 12:17 PM   Modules accepted: Orders

## 2018-10-14 ENCOUNTER — Other Ambulatory Visit: Payer: Self-pay | Admitting: *Deleted

## 2018-10-14 NOTE — Telephone Encounter (Signed)
Pt has called back to inform that she has no transportation to do rehab in [facility], she is asking for a call to discuss

## 2018-10-15 MED ORDER — FLUTICASONE PROPIONATE 50 MCG/ACT NA SUSP: NASAL | 2 refills | Status: DC

## 2018-10-15 NOTE — Telephone Encounter (Signed)
I called Chelsea [name] and could not LM for her concerning the questions she had about rehab.

## 2018-10-21 NOTE — Telephone Encounter (Signed)
I have called patient and spoke to her resent referral patient is going to call and schedule her apt [phone] .

## 2018-10-23 ENCOUNTER — Other Ambulatory Visit: Payer: Self-pay

## 2018-10-23 ENCOUNTER — Ambulatory Visit: Payer: Medicare Other | Admitting: Dietician

## 2018-10-23 ENCOUNTER — Ambulatory Visit (INDEPENDENT_AMBULATORY_CARE_PROVIDER_SITE_OTHER): Payer: Medicare Other | Admitting: Internal Medicine

## 2018-10-23 VITALS — BP 157/55 | HR 72 | Temp 98.5°F | Ht 64.0 in | Wt 220.8 lb

## 2018-10-23 DIAGNOSIS — I152 Hypertension secondary to endocrine disorders: Secondary | ICD-10-CM

## 2018-10-23 DIAGNOSIS — E1159 Type 2 diabetes mellitus with other circulatory complications: Secondary | ICD-10-CM

## 2018-10-23 DIAGNOSIS — I10 Essential (primary) hypertension: Secondary | ICD-10-CM

## 2018-10-23 DIAGNOSIS — I639 Cerebral infarction, unspecified: Secondary | ICD-10-CM

## 2018-10-23 DIAGNOSIS — Z794 Long term (current) use of insulin: Secondary | ICD-10-CM

## 2018-10-23 DIAGNOSIS — E113593 Type 2 diabetes mellitus with proliferative diabetic retinopathy without macular edema, bilateral: Secondary | ICD-10-CM | POA: Diagnosis not present

## 2018-10-23 MED ORDER — INSULIN GLARGINE 100 UNIT/ML SOLOSTAR PEN: 12.0000 [IU] | PEN_INJECTOR | Freq: Every day | SUBCUTANEOUS | Status: DC

## 2018-10-23 NOTE — Progress Notes (Signed)
Documentation: covisit for CGM download 1 with Dr. Maricela Bo see her note for details. Follow up for CGM download 2 in 1 week Debera Lat, RD 10/24/2018 5:44 PM.

## 2018-10-23 NOTE — Patient Instructions (Signed)
Keep wearing the Continuous glucose monitoring   I will remove the sensor next Thursday or if it falls off bring it with you.   [name] [phone]
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
## 2018-10-23 NOTE — Assessment & Plan Note (Signed)
The patient's last a1c=5.8 on August 28 2018. The patient's home blood glucose measurements over the past month per cgm have ranged 62-87 with average glucose in 70s. The patient does/does not note episodes of hypoglycemia.   The patient is currently taking lantus 18u, metformin 500mg  qd. The patient is compliant with medication. She states that she was on a higher dose of her metformin previously, but she had diarrhea on it. Hulan Fray is okay on the current dose.   Patient has gained 4lb over the past 1 month.  Assessment and plan  Patient with low blood glucose readings. Will decrease lantus down to 12u qhs. Follow up with donna. Continue metformin.

## 2018-10-23 NOTE — Assessment & Plan Note (Signed)
The patient's blood pressure during this visit was 148/55. The patient is currently taking verapamin 180mg  qd, spironolactone 50mg  qd, losartan-hctz 100-12.5mg  qd, hydralazine 25mg  tid. His last blood pressure visits are   BP Readings from Last 3 Encounters:  10/23/18 (!) 157/55  10/09/18 (!) 154/72  08/28/18 131/79   The patient's blood pressure during this visit was 148/55. The patient is currently taking verapamin 180mg  qd, spironolactone 50mg  qd, losartan-hctz 100-12.5mg  qd, hydralazine 25mg  tid. His last blood pressure visits are

## 2018-10-23 NOTE — Patient Instructions (Signed)
It was a pleasure to see you today [name]. Please make the following changes:  -please decrease your lantus down to 12u daily and continue metformin  -continue to record your blood sugar readings  -please follow up in 2 weeks  If you have any questions or concerns, please call our clinic at [phone] between 9am-5pm and after hours call [phone] and ask for the internal medicine resident on call. If you feel you are having a medical emergency please call 911.   Thank you, we look forward to help you remain healthy!  Lars Mage, MD Internal Medicine PGY3

## 2018-10-24 ENCOUNTER — Ambulatory Visit: Payer: Medicare Other | Attending: Physical Medicine & Rehabilitation | Admitting: Physical Therapy

## 2018-10-24 ENCOUNTER — Encounter: Payer: Self-pay | Admitting: Physical Therapy

## 2018-10-24 ENCOUNTER — Encounter: Payer: Self-pay | Admitting: Dietician

## 2018-10-24 VITALS — BP 129/57

## 2018-10-24 DIAGNOSIS — E66811 Obesity, class 1: Secondary | ICD-10-CM

## 2018-10-24 DIAGNOSIS — R262 Difficulty in walking, not elsewhere classified: Secondary | ICD-10-CM

## 2018-10-24 DIAGNOSIS — M6281 Muscle weakness (generalized): Secondary | ICD-10-CM

## 2018-10-24 DIAGNOSIS — M25562 Pain in left knee: Secondary | ICD-10-CM | POA: Insufficient documentation

## 2018-10-24 DIAGNOSIS — R471 Dysarthria and anarthria: Secondary | ICD-10-CM | POA: Diagnosis not present

## 2018-10-24 DIAGNOSIS — E669 Obesity, unspecified: Secondary | ICD-10-CM | POA: Insufficient documentation

## 2018-10-24 DIAGNOSIS — M25561 Pain in right knee: Secondary | ICD-10-CM | POA: Diagnosis not present

## 2018-10-24 DIAGNOSIS — G8929 Other chronic pain: Secondary | ICD-10-CM | POA: Diagnosis not present

## 2018-10-24 DIAGNOSIS — R41841 Cognitive communication deficit: Secondary | ICD-10-CM | POA: Insufficient documentation

## 2018-10-24 DIAGNOSIS — I6990 Unspecified sequelae of unspecified cerebrovascular disease: Secondary | ICD-10-CM | POA: Diagnosis not present

## 2018-10-24 DIAGNOSIS — I6930 Unspecified sequelae of cerebral infarction: Secondary | ICD-10-CM
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
## 2018-10-27 ENCOUNTER — Other Ambulatory Visit: Payer: Self-pay

## 2018-10-27 ENCOUNTER — Ambulatory Visit: Payer: Medicare Other | Admitting: Physical Therapy

## 2018-10-27 ENCOUNTER — Encounter: Payer: Self-pay | Admitting: Physical Therapy

## 2018-10-27 ENCOUNTER — Telehealth: Payer: Self-pay | Admitting: Internal Medicine

## 2018-10-27 VITALS — BP 150/59 | HR 75

## 2018-10-27 DIAGNOSIS — G8929 Other chronic pain: Secondary | ICD-10-CM | POA: Diagnosis not present

## 2018-10-27 DIAGNOSIS — M6281 Muscle weakness (generalized): Secondary | ICD-10-CM

## 2018-10-27 DIAGNOSIS — R262 Difficulty in walking, not elsewhere classified: Secondary | ICD-10-CM | POA: Diagnosis not present

## 2018-10-27 DIAGNOSIS — M25562 Pain in left knee: Secondary | ICD-10-CM | POA: Diagnosis not present

## 2018-10-27 DIAGNOSIS — E669 Obesity, unspecified: Secondary | ICD-10-CM | POA: Diagnosis not present

## 2018-10-27 DIAGNOSIS — M25561 Pain in right knee: Secondary | ICD-10-CM | POA: Diagnosis not present

## 2018-10-27 NOTE — Telephone Encounter (Signed)
Pt is requesting a call back as soon as possible.
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
## 2018-10-27 NOTE — Telephone Encounter (Signed)
Left voicemail for return call  

## 2018-10-27 NOTE — Patient Instructions (Signed)
Access Code: [email or web address]  URL: [email or web address]  Date: 10/27/2018  Prepared by: Janann August   Exercises Seated Heel Toe Raises - 10 reps - 2 sets - 3x daily - 7x weekly Seated Long Arc Quad - 10 reps - 2 sets - 3x daily - 7x weekly Sit to Stand with Armchair - 1x daily - 7x weekly Seated March - 10 reps - 2 sets - 2x daily - 7x weekly Seated Hip Adduction Squeeze with Ball - 10 reps - 2 sets - 3x daily - 7x weekly Seated Heel Slide - 10 reps - 2 sets - 3x daily - 7x weekly

## 2018-10-28 ENCOUNTER — Ambulatory Visit: Payer: Medicare Other | Admitting: Speech Pathology

## 2018-10-28 DIAGNOSIS — M25562 Pain in left knee: Secondary | ICD-10-CM | POA: Diagnosis not present

## 2018-10-28 DIAGNOSIS — M25561 Pain in right knee: Secondary | ICD-10-CM | POA: Diagnosis not present

## 2018-10-28 DIAGNOSIS — R41841 Cognitive communication deficit: Secondary | ICD-10-CM

## 2018-10-28 DIAGNOSIS — E669 Obesity, unspecified: Secondary | ICD-10-CM | POA: Diagnosis not present

## 2018-10-28 DIAGNOSIS — G8929 Other chronic pain: Secondary | ICD-10-CM | POA: Diagnosis not present

## 2018-10-28 DIAGNOSIS — R262 Difficulty in walking, not elsewhere classified: Secondary | ICD-10-CM | POA: Diagnosis not present

## 2018-10-28 DIAGNOSIS — R471 Dysarthria and anarthria: Secondary | ICD-10-CM

## 2018-10-28 DIAGNOSIS — M6281 Muscle weakness (generalized): Secondary | ICD-10-CM | POA: Diagnosis not present

## 2018-10-28 NOTE — Patient Instructions (Signed)
Remember, SLOW, LOUD, OVER-PRONOUNCE, PAUSE (SLOP)  Practice saying your family's names, slow, loud, with big movements. Repeat 3x each (twice each day)  Chelsea [name]) [name], what's up? This is Pharmacist, hospital.  Ask your family to help you add some other words and phrases that you say regularly. (ex: I'm hungry, what's for dinner?, etc)

## 2018-10-28 NOTE — Progress Notes (Signed)
Internal Medicine Clinic Attending  Case discussed with Dr. Chundi at the time of the visit.  We reviewed the resident's history and exam and pertinent patient test results.  I agree with the assessment, diagnosis, and plan of care documented in the resident's note. 

## 2018-10-28 NOTE — Addendum Note (Signed)
Addended by: Aldine Contes on: 10/28/2018 09:05 AM   Modules accepted: Level of Service
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
## 2018-10-29 ENCOUNTER — Ambulatory Visit: Payer: Medicare Other | Admitting: Physical Therapy

## 2018-10-29 ENCOUNTER — Encounter: Payer: Self-pay | Admitting: Physical Therapy

## 2018-10-29 ENCOUNTER — Other Ambulatory Visit: Payer: Self-pay

## 2018-10-29 VITALS — BP 153/61 | HR 76

## 2018-10-29 DIAGNOSIS — M25561 Pain in right knee: Secondary | ICD-10-CM | POA: Diagnosis not present

## 2018-10-29 DIAGNOSIS — R262 Difficulty in walking, not elsewhere classified: Secondary | ICD-10-CM | POA: Diagnosis not present

## 2018-10-29 DIAGNOSIS — E669 Obesity, unspecified: Secondary | ICD-10-CM | POA: Diagnosis not present

## 2018-10-29 DIAGNOSIS — M6281 Muscle weakness (generalized): Secondary | ICD-10-CM | POA: Diagnosis not present

## 2018-10-29 DIAGNOSIS — G8929 Other chronic pain: Secondary | ICD-10-CM | POA: Diagnosis not present

## 2018-10-29 DIAGNOSIS — M25562 Pain in left knee: Secondary | ICD-10-CM | POA: Diagnosis not present
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
## 2018-10-30 ENCOUNTER — Ambulatory Visit: Payer: Medicare Other | Admitting: Dietician

## 2018-10-30 ENCOUNTER — Encounter: Payer: Self-pay | Admitting: Dietician

## 2018-10-30 NOTE — Progress Notes (Addendum)
Documentation: CGM Results from Download #2  Average is  77  for 14 days Glucose management indicator 5.2 % Time in range (70-180 mg/dL): 51 % (Goal >70%) Time High (181-250 mg/dL) 0 % (Goal < 25%) Time Very High (>250 mg/dl) 0 % (Goal < 5%) Time Low (54-69 mg/dL): 41 % (Goal is <4%) Time Very Low (<54) 8%  (Goal <1%)  Coefficient of variation:29.4% (Goal is <36%)  Chelsea [name] reports no symptoms of hypoglycemia. Her meter shows low of 80 mg/dl, high of 185 and an average of 121 the week after her decrease in her lantus from 18 units to 12 units per day. She checks once a day in the morning. Her intake is good with 3 meals a day and snacks in the afternoon and evening.   Discussed the results with Dr. Lajuana Matte- he recommended she not change her diabetes medicine for now- monitoring next a1C, adjust as needed a that time.  Chelsea [name] has an eye exam this month with Dr. [place]. She does not want a flu vaccine today.   Follow up for diabetes self management support by phone and in office 6-12 months.  Debera Lat, RD 10/30/2018 1:29 PM.

## 2018-10-30 NOTE — Patient Instructions (Addendum)
Hi Chelsea [name],  It was reccommended that you continue on your current diabetes medicine for now.   Take care and call us anytime!  [name] [phone]

## 2018-11-03 ENCOUNTER — Other Ambulatory Visit: Payer: Self-pay

## 2018-11-03 ENCOUNTER — Ambulatory Visit: Payer: Medicare Other | Admitting: Physical Therapy

## 2018-11-03 ENCOUNTER — Encounter: Payer: Self-pay | Admitting: Physical Therapy

## 2018-11-03 DIAGNOSIS — E669 Obesity, unspecified: Secondary | ICD-10-CM | POA: Diagnosis not present

## 2018-11-03 DIAGNOSIS — M6281 Muscle weakness (generalized): Secondary | ICD-10-CM

## 2018-11-03 DIAGNOSIS — R262 Difficulty in walking, not elsewhere classified: Secondary | ICD-10-CM | POA: Diagnosis not present

## 2018-11-03 DIAGNOSIS — M25562 Pain in left knee: Secondary | ICD-10-CM | POA: Diagnosis not present

## 2018-11-03 DIAGNOSIS — G8929 Other chronic pain: Secondary | ICD-10-CM

## 2018-11-03 DIAGNOSIS — M25561 Pain in right knee: Secondary | ICD-10-CM | POA: Diagnosis not present
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
## 2018-11-04 ENCOUNTER — Telehealth: Payer: Self-pay | Admitting: Dietician

## 2018-11-04 ENCOUNTER — Other Ambulatory Visit: Payer: Self-pay | Admitting: Physical Medicine and Rehabilitation

## 2018-11-04 DIAGNOSIS — Z794 Long term (current) use of insulin: Secondary | ICD-10-CM

## 2018-11-04 DIAGNOSIS — E113593 Type 2 diabetes mellitus with proliferative diabetic retinopathy without macular edema, bilateral: Secondary | ICD-10-CM

## 2018-11-04 NOTE — Telephone Encounter (Signed)
Called [facility] who will have strips ready in 1 hour. patient notified.

## 2018-11-05 ENCOUNTER — Ambulatory Visit: Payer: Medicare Other | Admitting: Physical Therapy

## 2018-11-05 ENCOUNTER — Encounter: Payer: Self-pay | Admitting: Physical Medicine & Rehabilitation

## 2018-11-05 ENCOUNTER — Encounter: Payer: Self-pay | Admitting: Physical Therapy

## 2018-11-05 ENCOUNTER — Ambulatory Visit: Payer: Medicare Other

## 2018-11-05 ENCOUNTER — Other Ambulatory Visit: Payer: Self-pay

## 2018-11-05 ENCOUNTER — Encounter: Payer: Medicare Other | Attending: Physical Medicine & Rehabilitation | Admitting: Physical Medicine & Rehabilitation

## 2018-11-05 VITALS — BP 153/77 | HR 94 | Temp 97.5°F | Ht 64.0 in | Wt 220.0 lb

## 2018-11-05 DIAGNOSIS — M6281 Muscle weakness (generalized): Secondary | ICD-10-CM | POA: Diagnosis not present

## 2018-11-05 DIAGNOSIS — R262 Difficulty in walking, not elsewhere classified: Secondary | ICD-10-CM

## 2018-11-05 DIAGNOSIS — M170 Bilateral primary osteoarthritis of knee: Secondary | ICD-10-CM | POA: Insufficient documentation

## 2018-11-05 DIAGNOSIS — R471 Dysarthria and anarthria: Secondary | ICD-10-CM

## 2018-11-05 DIAGNOSIS — M25561 Pain in right knee: Secondary | ICD-10-CM | POA: Diagnosis not present

## 2018-11-05 DIAGNOSIS — G8929 Other chronic pain: Secondary | ICD-10-CM | POA: Diagnosis not present

## 2018-11-05 DIAGNOSIS — R41841 Cognitive communication deficit: Secondary | ICD-10-CM

## 2018-11-05 DIAGNOSIS — I639 Cerebral infarction, unspecified: Secondary | ICD-10-CM | POA: Diagnosis not present

## 2018-11-05 DIAGNOSIS — E669 Obesity, unspecified: Secondary | ICD-10-CM | POA: Diagnosis not present

## 2018-11-05 DIAGNOSIS — M25562 Pain in left knee: Secondary | ICD-10-CM | POA: Diagnosis not present
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
## 2018-11-05 NOTE — Patient Instructions (Signed)
   HOW ARE YOU DOING TODAY?  HOW WAS YOUR DAY?  ARE YOU HUNGRY?  ARE YOU STILL LOOKING FOR A JOB?  DON'T FORGET TO DO THE DISHES.
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
## 2018-11-05 NOTE — Patient Instructions (Signed)
PLEASE FEEL FREE TO CALL OUR OFFICE WITH ANY PROBLEMS OR QUESTIONS ([phone])      
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
## 2018-11-06 ENCOUNTER — Telehealth: Payer: Self-pay

## 2018-11-06 ENCOUNTER — Encounter: Payer: Self-pay | Admitting: Internal Medicine

## 2018-11-06 ENCOUNTER — Ambulatory Visit (INDEPENDENT_AMBULATORY_CARE_PROVIDER_SITE_OTHER): Payer: Medicare Other | Admitting: Internal Medicine

## 2018-11-06 ENCOUNTER — Other Ambulatory Visit: Payer: Self-pay

## 2018-11-06 VITALS — BP 158/48 | HR 74 | Temp 98.2°F | Ht 64.0 in | Wt 218.5 lb

## 2018-11-06 DIAGNOSIS — Z794 Long term (current) use of insulin: Secondary | ICD-10-CM

## 2018-11-06 DIAGNOSIS — E1159 Type 2 diabetes mellitus with other circulatory complications: Secondary | ICD-10-CM | POA: Diagnosis not present

## 2018-11-06 DIAGNOSIS — I10 Essential (primary) hypertension: Secondary | ICD-10-CM | POA: Diagnosis not present

## 2018-11-06 DIAGNOSIS — I639 Cerebral infarction, unspecified: Secondary | ICD-10-CM | POA: Diagnosis not present

## 2018-11-06 DIAGNOSIS — E113593 Type 2 diabetes mellitus with proliferative diabetic retinopathy without macular edema, bilateral: Secondary | ICD-10-CM

## 2018-11-06 DIAGNOSIS — I152 Hypertension secondary to endocrine disorders: Secondary | ICD-10-CM

## 2018-11-06 LAB — POCT GLYCOSYLATED HEMOGLOBIN (HGB A1C): Hemoglobin A1C: 5.8 % — AB (ref 4.0–5.6)

## 2018-11-06 LAB — GLUCOSE, CAPILLARY: Glucose-Capillary: 94 mg/dL (ref 70–99)

## 2018-11-06 MED ORDER — AMLODIPINE BESYLATE 5 MG PO TABS: 5.0000 mg | ORAL_TABLET | Freq: Every day | ORAL | 1 refills | Status: DC

## 2018-11-06 NOTE — Telephone Encounter (Signed)
Requesting to speak with Dr. Maricela Bo. Please call back.

## 2018-11-06 NOTE — Patient Instructions (Signed)
It was a pleasure to see you today Ms. Mcmanigal. Please make the following changes:  -please stop taking insulin, your a1c is great and you might not need it! -please continue to measure your blood glucose readings  -please stop hydralazine  -please start amlodipine 5mg  daily  -follow up in 4 weeks   If you have any questions or concerns, please call our clinic at [phone] between 9am-5pm and after hours call [phone] and ask for the internal medicine resident on call. If you feel you are having a medical emergency please call 911.   Thank you, we look forward to help you remain healthy!  Lars Mage, MD Internal Medicine PGY3
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
## 2018-11-07 ENCOUNTER — Ambulatory Visit: Payer: Medicare Other

## 2018-11-07 ENCOUNTER — Ambulatory Visit: Payer: Medicare Other | Admitting: Physical Therapy

## 2018-11-07 DIAGNOSIS — R262 Difficulty in walking, not elsewhere classified: Secondary | ICD-10-CM | POA: Diagnosis not present

## 2018-11-07 DIAGNOSIS — G8929 Other chronic pain: Secondary | ICD-10-CM

## 2018-11-07 DIAGNOSIS — M6281 Muscle weakness (generalized): Secondary | ICD-10-CM | POA: Diagnosis not present

## 2018-11-07 DIAGNOSIS — M25561 Pain in right knee: Secondary | ICD-10-CM | POA: Diagnosis not present

## 2018-11-07 DIAGNOSIS — R41841 Cognitive communication deficit: Secondary | ICD-10-CM

## 2018-11-07 DIAGNOSIS — E669 Obesity, unspecified: Secondary | ICD-10-CM | POA: Diagnosis not present

## 2018-11-07 DIAGNOSIS — M25562 Pain in left knee: Secondary | ICD-10-CM | POA: Diagnosis not present

## 2018-11-07 DIAGNOSIS — R471 Dysarthria and anarthria: Secondary | ICD-10-CM
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
## 2018-11-07 NOTE — Telephone Encounter (Signed)
Attempted to call patient, not able to reach. Left message
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
## 2018-11-07 NOTE — Telephone Encounter (Signed)
Pt rtn your call.  Patient states she could not answer the phone.  Please call back.

## 2018-11-07 NOTE — Assessment & Plan Note (Signed)
The patient's last a1c=5.8 on 08/28/18 and today continues to be 5.8. Patient's blood glucose has ranged 90-150s. She is currently taking metformin 500mg  qd and lantus 12units daily. The patient is compliant with medication. Patient's weight changes lost 2 lbs.   Assessment and plan  Patient has consistently had a1c levels between 5.8-6.3 with controlled blood glucose. Patient is without need for insulin so will discontinue.  -continue metformin 500mg  qd  -stop lantus 12units

## 2018-11-07 NOTE — Assessment & Plan Note (Signed)
The patient's blood pressure during this visit was 158/48. The patient is currently taking spironolactone 50mg  qd, losartan-hctz 100-12.5mg  qd, hydralazine 25mg  tid, and verapamil 180mg  qd. Her last blood pressure visits are   BP Readings from Last 3 Encounters:  11/06/18 (!) 158/48  11/05/18 (!) 153/77  10/29/18 (!) 153/61   The patient does not report palpitations, dizziness, chest pain, sob.  Assessment and Plan Patient's blood pressure is uncontrolled. I suspect it is due to poor medication adherence.   -stop hydralazine  -start amlodipine 5mg  qd

## 2018-11-10 NOTE — Progress Notes (Signed)
Internal Medicine Clinic Attending  Case discussed with Dr. Chundi at the time of the visit.  We reviewed the resident's history and exam and pertinent patient test results.  I agree with the assessment, diagnosis, and plan of care documented in the resident's note. 

## 2018-11-11 ENCOUNTER — Telehealth: Payer: Self-pay

## 2018-11-11 ENCOUNTER — Ambulatory Visit (INDEPENDENT_AMBULATORY_CARE_PROVIDER_SITE_OTHER): Payer: Medicare Other | Admitting: Podiatry

## 2018-11-11 ENCOUNTER — Other Ambulatory Visit: Payer: Self-pay

## 2018-11-11 ENCOUNTER — Encounter: Payer: Self-pay | Admitting: Podiatry

## 2018-11-11 DIAGNOSIS — G4700 Insomnia, unspecified: Secondary | ICD-10-CM | POA: Diagnosis not present

## 2018-11-11 DIAGNOSIS — I8390 Asymptomatic varicose veins of unspecified lower extremity: Secondary | ICD-10-CM | POA: Diagnosis not present

## 2018-11-11 DIAGNOSIS — Q828 Other specified congenital malformations of skin: Secondary | ICD-10-CM | POA: Diagnosis not present

## 2018-11-11 DIAGNOSIS — E118 Type 2 diabetes mellitus with unspecified complications: Secondary | ICD-10-CM

## 2018-11-11 DIAGNOSIS — I152 Hypertension secondary to endocrine disorders: Secondary | ICD-10-CM | POA: Diagnosis not present

## 2018-11-11 DIAGNOSIS — E1122 Type 2 diabetes mellitus with diabetic chronic kidney disease: Secondary | ICD-10-CM | POA: Diagnosis not present

## 2018-11-11 DIAGNOSIS — M170 Bilateral primary osteoarthritis of knee: Secondary | ICD-10-CM | POA: Diagnosis not present

## 2018-11-11 DIAGNOSIS — F410 Panic disorder [episodic paroxysmal anxiety] without agoraphobia: Secondary | ICD-10-CM | POA: Diagnosis not present

## 2018-11-11 DIAGNOSIS — Z6837 Body mass index (BMI) 37.0-37.9, adult: Secondary | ICD-10-CM | POA: Diagnosis not present

## 2018-11-11 DIAGNOSIS — B351 Tinea unguium: Secondary | ICD-10-CM | POA: Diagnosis not present

## 2018-11-11 DIAGNOSIS — M79675 Pain in left toe(s): Secondary | ICD-10-CM

## 2018-11-11 DIAGNOSIS — J45909 Unspecified asthma, uncomplicated: Secondary | ICD-10-CM | POA: Diagnosis not present

## 2018-11-11 DIAGNOSIS — M79674 Pain in right toe(s): Secondary | ICD-10-CM | POA: Diagnosis not present

## 2018-11-11 DIAGNOSIS — E1169 Type 2 diabetes mellitus with other specified complication: Secondary | ICD-10-CM | POA: Diagnosis not present

## 2018-11-11 DIAGNOSIS — M8580 Other specified disorders of bone density and structure, unspecified site: Secondary | ICD-10-CM | POA: Diagnosis not present

## 2018-11-11 DIAGNOSIS — E669 Obesity, unspecified: Secondary | ICD-10-CM | POA: Diagnosis not present

## 2018-11-11 DIAGNOSIS — G894 Chronic pain syndrome: Secondary | ICD-10-CM | POA: Diagnosis not present

## 2018-11-11 DIAGNOSIS — I69398 Other sequelae of cerebral infarction: Secondary | ICD-10-CM | POA: Diagnosis not present

## 2018-11-11 DIAGNOSIS — E1159 Type 2 diabetes mellitus with other circulatory complications: Secondary | ICD-10-CM | POA: Diagnosis not present

## 2018-11-11 DIAGNOSIS — R2689 Other abnormalities of gait and mobility: Secondary | ICD-10-CM | POA: Diagnosis not present

## 2018-11-11 DIAGNOSIS — E7849 Other hyperlipidemia: Secondary | ICD-10-CM | POA: Diagnosis not present

## 2018-11-11 DIAGNOSIS — E1136 Type 2 diabetes mellitus with diabetic cataract: Secondary | ICD-10-CM | POA: Diagnosis not present

## 2018-11-11 DIAGNOSIS — Z7982 Long term (current) use of aspirin: Secondary | ICD-10-CM | POA: Diagnosis not present

## 2018-11-11 DIAGNOSIS — K219 Gastro-esophageal reflux disease without esophagitis: Secondary | ICD-10-CM | POA: Diagnosis not present

## 2018-11-11 DIAGNOSIS — N183 Chronic kidney disease, stage 3 (moderate): Secondary | ICD-10-CM | POA: Diagnosis not present

## 2018-11-11 DIAGNOSIS — Z7984 Long term (current) use of oral hypoglycemic drugs: Secondary | ICD-10-CM | POA: Diagnosis not present

## 2018-11-11 DIAGNOSIS — Z9181 History of falling: Secondary | ICD-10-CM | POA: Diagnosis not present

## 2018-11-11 DIAGNOSIS — E11319 Type 2 diabetes mellitus with unspecified diabetic retinopathy without macular edema: Secondary | ICD-10-CM | POA: Diagnosis not present

## 2018-11-11 DIAGNOSIS — Z8781 Personal history of (healed) traumatic fracture: Secondary | ICD-10-CM | POA: Diagnosis not present

## 2018-11-11 NOTE — Progress Notes (Signed)
[name], female   DOB: [birth year], 68 y.o.   MRN: [id number] Complaint:  Visit Type: Patient returns to my office for continued preventative foot care services. Complaint: Patient states" my nails have grown long and thick and become painful to walk and wear shoes" Patient has been diagnosed with DM with neuropathy He presents for preventative foot care services. No changes to ROS.  Painful callus under the ball of left foot. Patient has not been seen in over 1 year.  She presents to the office with her daughter-in-law.  Podiatric Exam: Vascular: dorsalis pedis and posterior tibial pulses are palpable bilateral. Capillary return is immediate. Temperature gradient is WNL. Skin turgor WNL  Sensorium: Diminished Semmes Weinstein monofilament test. Normal tactile sensation bilaterally. Nail Exam: Pt has thick disfigured discolored nails with subungual debris noted bilateral entire nail hallux through fifth toenails Ulcer Exam: There is no evidence of ulcer or pre-ulcerative changes or infection. Orthopedic Exam: Muscle tone and strength are WNL. No limitations in general ROM. No crepitus or effusions noted. Foot type and digits show no abnormalities. DJD 1st MPJ B/L. Skin:  Porokeratosis sib 1, left .[name] Kitchen No infection or ulcers.    Diagnosis:  Tinea unguium, Pain in right toe, pain in left toes  porokeratosis  Treatment & Plan Procedures and Treatment: Consent by patient was obtained for treatment procedures. The patient understood the discussion of treatment and procedures well. All questions were answered thoroughly reviewed. Debridement of mycotic and hypertrophic toenails, 1 through 5 bilateral and clearing of subungual debris. No ulceration, no infection noted. Debride porokeratosis  left .Return Visit-Office Procedure: Patient instructed to return to the office for a follow up visit 3 months for continued evaluation and treatment.   Gardiner Barefoot DPM

## 2018-11-11 NOTE — Telephone Encounter (Signed)
Clair Gulling, PT from [facility] called requesting HHPT 1wk1, 2wk3, 1wk5 functional mobility and fall risk reduction. Orders approved and given.

## 2018-11-12 ENCOUNTER — Ambulatory Visit: Payer: Medicare Other

## 2018-11-12 ENCOUNTER — Ambulatory Visit: Payer: Medicare Other | Admitting: Physical Therapy

## 2018-11-12 ENCOUNTER — Telehealth: Payer: Self-pay | Admitting: Internal Medicine

## 2018-11-12 NOTE — Telephone Encounter (Signed)
VO given for [facility] OT  2x week for 4 weeks 1x week for 3 weeks For safety, adl's and home management Do you agree?

## 2018-11-12 NOTE — Telephone Encounter (Signed)
Rec'd phone call from [facility] [name]) requesting a VO for OT for 2 times a week up to 4 weeks and 1 time up to 3 weeks.  Please call [phone] if any questions.

## 2018-11-13 ENCOUNTER — Telehealth: Payer: Self-pay | Admitting: *Deleted

## 2018-11-13 ENCOUNTER — Telehealth: Payer: Self-pay

## 2018-11-13 NOTE — Telephone Encounter (Signed)
[name], [facility] left a message asking for verbal orders for HHOT 2week4. Medical record reviewed. Social work note reviewed.  Verbal orders given per office protocol.

## 2018-11-13 NOTE — Telephone Encounter (Signed)
Chelsea [name] called from [facility] called stating has prescription for Lantus solotar 22 units but patient states Dr. Letta Pate decreased her to 12 units. Need new script. Is this correct?

## 2018-11-13 NOTE — Telephone Encounter (Signed)
I am not prescribing this patient's insulin

## 2018-11-14 ENCOUNTER — Other Ambulatory Visit: Payer: Self-pay | Admitting: Internal Medicine

## 2018-11-14 ENCOUNTER — Ambulatory Visit: Payer: Medicare Other

## 2018-11-14 ENCOUNTER — Other Ambulatory Visit: Payer: Self-pay | Admitting: *Deleted

## 2018-11-14 ENCOUNTER — Ambulatory Visit: Payer: Medicare Other | Admitting: Physical Therapy

## 2018-11-14 NOTE — Telephone Encounter (Signed)
Please call the pharmacist back.  Need clarification in reference to dosage of lantus. Patient states she is taking 12 and not 22 units.

## 2018-11-14 NOTE — Telephone Encounter (Signed)
rtc to pharm, per dr chundi's note 9/24 pt is to stop insulin and only cont metformin. She is to f/u in 4 weeks

## 2018-11-14 NOTE — Telephone Encounter (Signed)
Forwarding to patients primary care provider for medication and diabetes management.

## 2018-11-18 ENCOUNTER — Encounter: Payer: Medicare Other | Admitting: Speech Pathology

## 2018-11-18 ENCOUNTER — Ambulatory Visit: Payer: Medicare Other | Admitting: Physical Therapy

## 2018-11-20 ENCOUNTER — Encounter: Payer: Medicare Other | Admitting: Speech Pathology

## 2018-11-20 ENCOUNTER — Ambulatory Visit: Payer: Medicare Other | Admitting: Physical Therapy

## 2018-11-25 ENCOUNTER — Ambulatory Visit: Payer: Medicare Other | Admitting: Physical Therapy

## 2018-11-25 ENCOUNTER — Encounter: Payer: Medicare Other | Admitting: Speech Pathology

## 2018-11-27 ENCOUNTER — Ambulatory Visit: Payer: Medicare Other | Admitting: Physical Therapy

## 2018-12-01 NOTE — Assessment & Plan Note (Addendum)
Medications:amlodipine 5mg , losartan-hctz 100-12.5 mg, verapamil 180mg , spironolactone 50mg  Blood pressures were fairly elevated at visit in September. Hydralazine was stopped at that time and patient was started on amlodipine. Pt reports compliance with medication. Denies dry cough, orthostatic lightheadedness, headaches and palpitations BP remains elevated today in office with systolic >379 Reports that blood pressure readings have been lower at home. Plan BMP today. Will d/c verapamil. Continue remainder of medications. She will log home blood pressures and bring them to her next visit. F/u 3 mo

## 2018-12-01 NOTE — Assessment & Plan Note (Addendum)
Renal function has been steadily declining over the past year. GFR in June approx 35. Pt endorses good UOP. Discussed decline and prognosis.  Medications reviewed and no changes needed to adjust for renal function at this time Plan: Repeat BMP today.  referral placed to nephrology. F/u 3 months.

## 2018-12-02 ENCOUNTER — Other Ambulatory Visit: Payer: Self-pay

## 2018-12-02 ENCOUNTER — Encounter: Payer: Self-pay | Admitting: Internal Medicine

## 2018-12-02 ENCOUNTER — Ambulatory Visit (INDEPENDENT_AMBULATORY_CARE_PROVIDER_SITE_OTHER): Payer: Medicare Other | Admitting: Internal Medicine

## 2018-12-02 VITALS — BP 154/45 | HR 70 | Temp 98.2°F | Wt 222.2 lb

## 2018-12-02 DIAGNOSIS — E1122 Type 2 diabetes mellitus with diabetic chronic kidney disease: Secondary | ICD-10-CM | POA: Diagnosis not present

## 2018-12-02 DIAGNOSIS — Z0000 Encounter for general adult medical examination without abnormal findings: Secondary | ICD-10-CM

## 2018-12-02 DIAGNOSIS — I129 Hypertensive chronic kidney disease with stage 1 through stage 4 chronic kidney disease, or unspecified chronic kidney disease: Secondary | ICD-10-CM

## 2018-12-02 DIAGNOSIS — E11319 Type 2 diabetes mellitus with unspecified diabetic retinopathy without macular edema: Secondary | ICD-10-CM | POA: Diagnosis not present

## 2018-12-02 DIAGNOSIS — Z79899 Other long term (current) drug therapy: Secondary | ICD-10-CM

## 2018-12-02 DIAGNOSIS — Z7984 Long term (current) use of oral hypoglycemic drugs: Secondary | ICD-10-CM

## 2018-12-02 DIAGNOSIS — Z7982 Long term (current) use of aspirin: Secondary | ICD-10-CM | POA: Diagnosis not present

## 2018-12-02 DIAGNOSIS — Z8673 Personal history of transient ischemic attack (TIA), and cerebral infarction without residual deficits: Secondary | ICD-10-CM

## 2018-12-02 DIAGNOSIS — I6340 Cerebral infarction due to embolism of unspecified cerebral artery: Secondary | ICD-10-CM

## 2018-12-02 DIAGNOSIS — N1832 Chronic kidney disease, stage 3b: Secondary | ICD-10-CM

## 2018-12-02 DIAGNOSIS — D649 Anemia, unspecified: Secondary | ICD-10-CM

## 2018-12-02 DIAGNOSIS — I10 Essential (primary) hypertension: Secondary | ICD-10-CM

## 2018-12-02 DIAGNOSIS — E1159 Type 2 diabetes mellitus with other circulatory complications: Secondary | ICD-10-CM

## 2018-12-02 DIAGNOSIS — I152 Hypertension secondary to endocrine disorders: Secondary | ICD-10-CM

## 2018-12-02 DIAGNOSIS — E113593 Type 2 diabetes mellitus with proliferative diabetic retinopathy without macular edema, bilateral: Secondary | ICD-10-CM

## 2018-12-02 LAB — BASIC METABOLIC PANEL
Anion gap: 11 (ref 5–15)
BUN: 45 mg/dL — ABNORMAL HIGH (ref 8–23)
CO2: 21 mmol/L — ABNORMAL LOW (ref 22–32)
Calcium: 8.9 mg/dL (ref 8.9–10.3)
Chloride: 108 mmol/L (ref 98–111)
Creatinine, Ser: 1.54 mg/dL — ABNORMAL HIGH (ref 0.44–1.00)
GFR calc Af Amer: 39 mL/min — ABNORMAL LOW (ref >60)
GFR calc non Af Amer: 34 mL/min — ABNORMAL LOW (ref >60)
Glucose, Bld: 152 mg/dL — ABNORMAL HIGH (ref 70–99)
Potassium: 4.8 mmol/L (ref 3.5–5.1)
Sodium: 140 mmol/L (ref 135–145)

## 2018-12-02 MED ORDER — AMLODIPINE BESYLATE 5 MG PO TABS: 5.0000 mg | ORAL_TABLET | Freq: Every day | ORAL | 1 refills | Status: DC

## 2018-12-02 MED ORDER — METFORMIN HCL ER 500 MG PO TB24: ORAL_TABLET | ORAL | 2 refills | Status: DC

## 2018-12-02 MED ORDER — SPIRONOLACTONE 50 MG PO TABS: 50.0000 mg | ORAL_TABLET | Freq: Every day | ORAL | 2 refills | Status: DC

## 2018-12-02 MED ORDER — LOSARTAN POTASSIUM-HCTZ 100-12.5 MG PO TABS: 1.0000 | ORAL_TABLET | Freq: Every day | ORAL | 5 refills | Status: DC

## 2018-12-02 NOTE — Patient Instructions (Signed)
It was a pleasure seeing you today. I have placed the referral for nephrology. They will call and arrange an appointment that works for your schedule. Please record your home blood pressures and bring them with to your next appointment with me. Please follow up in 3 months. Please feel free to reach out me sooner if anything comes up in the meantime. You can contact me via MyChart or calling the clinic.  Take care!

## 2018-12-02 NOTE — Assessment & Plan Note (Signed)
Discussed with patient her functional goals and abilities. Has difficulties with bathing, cooking, cleaning and tolieting. Daughter in law currently assisting patient.  Home health care service paperwork filled out.

## 2018-12-02 NOTE — Assessment & Plan Note (Signed)
Current medications: metformin 500 mg daily Denies side effects. Glucose meter report reviewed. Blood sugars <200 for the most part.  No changes at this time.

## 2018-12-02 NOTE — Assessment & Plan Note (Signed)
Discussed need for colonoscopy. Patient denies any recent melena or hematchezia. Patient aware of need and will set up appt.   Pt refusing flu and pna vaccine. Pt refusing mammogram.
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
## 2018-12-03 ENCOUNTER — Telehealth: Payer: Self-pay | Admitting: *Deleted

## 2018-12-03 LAB — IRON,TIBC AND FERRITIN PANEL
Ferritin: 142 ng/mL (ref 15–150)
Iron Saturation: 24 % (ref 15–55)
Iron: 61 ug/dL (ref 27–139)
Total Iron Binding Capacity: 256 ug/dL (ref 250–450)
UIBC: 195 ug/dL (ref 118–369)

## 2018-12-03 NOTE — Progress Notes (Signed)
Please call and let her know that labs are stable (not any worse) from last time. Referral to nephrology placed.--Thanks

## 2018-12-03 NOTE — Telephone Encounter (Signed)
Patient notified of info below. Hubbard Hartshorn, BSN, RN-BC

## 2018-12-03 NOTE — Telephone Encounter (Signed)
-----   Message from Mitzi Hansen, MD sent at 12/03/2018  1:56 PM EDT ----- Please call and let her know that labs are stable (not any worse) from last time. Referral to nephrology placed.--Thanks

## 2018-12-05 NOTE — Telephone Encounter (Signed)
Completed and signed Request for Independent Assessment for [place] of Medical Need faxed to [facility] at [phone].  Jakobi Thetford L Ducatte10/23/20209:11 AM

## 2018-12-11 ENCOUNTER — Other Ambulatory Visit: Payer: Self-pay | Admitting: Internal Medicine

## 2018-12-11 ENCOUNTER — Other Ambulatory Visit: Payer: Self-pay | Admitting: Physical Medicine & Rehabilitation

## 2018-12-11 DIAGNOSIS — E1136 Type 2 diabetes mellitus with diabetic cataract: Secondary | ICD-10-CM | POA: Diagnosis not present

## 2018-12-11 DIAGNOSIS — F410 Panic disorder [episodic paroxysmal anxiety] without agoraphobia: Secondary | ICD-10-CM | POA: Diagnosis not present

## 2018-12-11 DIAGNOSIS — Z9181 History of falling: Secondary | ICD-10-CM | POA: Diagnosis not present

## 2018-12-11 DIAGNOSIS — E1159 Type 2 diabetes mellitus with other circulatory complications: Secondary | ICD-10-CM | POA: Diagnosis not present

## 2018-12-11 DIAGNOSIS — F329 Major depressive disorder, single episode, unspecified: Secondary | ICD-10-CM

## 2018-12-11 DIAGNOSIS — Z7984 Long term (current) use of oral hypoglycemic drugs: Secondary | ICD-10-CM | POA: Diagnosis not present

## 2018-12-11 DIAGNOSIS — J45909 Unspecified asthma, uncomplicated: Secondary | ICD-10-CM | POA: Diagnosis not present

## 2018-12-11 DIAGNOSIS — N183 Chronic kidney disease, stage 3 unspecified: Secondary | ICD-10-CM | POA: Diagnosis not present

## 2018-12-11 DIAGNOSIS — I8390 Asymptomatic varicose veins of unspecified lower extremity: Secondary | ICD-10-CM | POA: Diagnosis not present

## 2018-12-11 DIAGNOSIS — K219 Gastro-esophageal reflux disease without esophagitis: Secondary | ICD-10-CM | POA: Diagnosis not present

## 2018-12-11 DIAGNOSIS — M8580 Other specified disorders of bone density and structure, unspecified site: Secondary | ICD-10-CM | POA: Diagnosis not present

## 2018-12-11 DIAGNOSIS — Z8781 Personal history of (healed) traumatic fracture: Secondary | ICD-10-CM | POA: Diagnosis not present

## 2018-12-11 DIAGNOSIS — I69398 Other sequelae of cerebral infarction: Secondary | ICD-10-CM | POA: Diagnosis not present

## 2018-12-11 DIAGNOSIS — I152 Hypertension secondary to endocrine disorders: Secondary | ICD-10-CM

## 2018-12-11 DIAGNOSIS — E669 Obesity, unspecified: Secondary | ICD-10-CM | POA: Diagnosis not present

## 2018-12-11 DIAGNOSIS — Z7982 Long term (current) use of aspirin: Secondary | ICD-10-CM | POA: Diagnosis not present

## 2018-12-11 DIAGNOSIS — G894 Chronic pain syndrome: Secondary | ICD-10-CM | POA: Diagnosis not present

## 2018-12-11 DIAGNOSIS — M170 Bilateral primary osteoarthritis of knee: Secondary | ICD-10-CM | POA: Diagnosis not present

## 2018-12-11 DIAGNOSIS — E1122 Type 2 diabetes mellitus with diabetic chronic kidney disease: Secondary | ICD-10-CM | POA: Diagnosis not present

## 2018-12-11 DIAGNOSIS — E11319 Type 2 diabetes mellitus with unspecified diabetic retinopathy without macular edema: Secondary | ICD-10-CM | POA: Diagnosis not present

## 2018-12-11 DIAGNOSIS — E7849 Other hyperlipidemia: Secondary | ICD-10-CM | POA: Diagnosis not present

## 2018-12-11 DIAGNOSIS — E1169 Type 2 diabetes mellitus with other specified complication: Secondary | ICD-10-CM | POA: Diagnosis not present

## 2018-12-11 DIAGNOSIS — Z6837 Body mass index (BMI) 37.0-37.9, adult: Secondary | ICD-10-CM | POA: Diagnosis not present

## 2018-12-11 DIAGNOSIS — G4700 Insomnia, unspecified: Secondary | ICD-10-CM | POA: Diagnosis not present

## 2018-12-11 DIAGNOSIS — R2689 Other abnormalities of gait and mobility: Secondary | ICD-10-CM | POA: Diagnosis not present

## 2018-12-12 NOTE — Progress Notes (Signed)
Internal Medicine Clinic Attending  I saw and evaluated the patient.  I personally confirmed the key portions of the history and exam documented by Dr. Christian   and I reviewed pertinent patient test results.  The assessment, diagnosis, and plan were formulated together and I agree with the documentation in the resident's note.  

## 2018-12-15 ENCOUNTER — Other Ambulatory Visit: Payer: Self-pay | Admitting: Internal Medicine

## 2018-12-15 DIAGNOSIS — E1159 Type 2 diabetes mellitus with other circulatory complications: Secondary | ICD-10-CM

## 2018-12-15 DIAGNOSIS — M170 Bilateral primary osteoarthritis of knee: Secondary | ICD-10-CM | POA: Diagnosis not present

## 2018-12-15 DIAGNOSIS — I152 Hypertension secondary to endocrine disorders: Secondary | ICD-10-CM | POA: Diagnosis not present

## 2018-12-15 DIAGNOSIS — G894 Chronic pain syndrome: Secondary | ICD-10-CM | POA: Diagnosis not present

## 2018-12-15 DIAGNOSIS — R2689 Other abnormalities of gait and mobility: Secondary | ICD-10-CM | POA: Diagnosis not present

## 2018-12-15 DIAGNOSIS — I69398 Other sequelae of cerebral infarction: Secondary | ICD-10-CM | POA: Diagnosis not present

## 2018-12-25 ENCOUNTER — Other Ambulatory Visit: Payer: Self-pay | Admitting: Internal Medicine

## 2018-12-25 DIAGNOSIS — E113593 Type 2 diabetes mellitus with proliferative diabetic retinopathy without macular edema, bilateral: Secondary | ICD-10-CM

## 2019-01-07 ENCOUNTER — Telehealth: Payer: Self-pay

## 2019-01-07 NOTE — Telephone Encounter (Signed)
Clair Gulling PT [facility] called requesting verbal orders for 1x8.   Called him back and approved verbal orders.

## 2019-01-10 DIAGNOSIS — E1136 Type 2 diabetes mellitus with diabetic cataract: Secondary | ICD-10-CM | POA: Diagnosis not present

## 2019-01-10 DIAGNOSIS — N170 Acute kidney failure with tubular necrosis: Secondary | ICD-10-CM | POA: Diagnosis not present

## 2019-01-10 DIAGNOSIS — J45909 Unspecified asthma, uncomplicated: Secondary | ICD-10-CM | POA: Diagnosis not present

## 2019-01-10 DIAGNOSIS — I152 Hypertension secondary to endocrine disorders: Secondary | ICD-10-CM | POA: Diagnosis not present

## 2019-01-10 DIAGNOSIS — E669 Obesity, unspecified: Secondary | ICD-10-CM | POA: Diagnosis not present

## 2019-01-10 DIAGNOSIS — R2689 Other abnormalities of gait and mobility: Secondary | ICD-10-CM | POA: Diagnosis not present

## 2019-01-10 DIAGNOSIS — I69398 Other sequelae of cerebral infarction: Secondary | ICD-10-CM | POA: Diagnosis not present

## 2019-01-10 DIAGNOSIS — F410 Panic disorder [episodic paroxysmal anxiety] without agoraphobia: Secondary | ICD-10-CM | POA: Diagnosis not present

## 2019-01-10 DIAGNOSIS — E1169 Type 2 diabetes mellitus with other specified complication: Secondary | ICD-10-CM | POA: Diagnosis not present

## 2019-01-10 DIAGNOSIS — K219 Gastro-esophageal reflux disease without esophagitis: Secondary | ICD-10-CM | POA: Diagnosis not present

## 2019-01-10 DIAGNOSIS — G4700 Insomnia, unspecified: Secondary | ICD-10-CM | POA: Diagnosis not present

## 2019-01-10 DIAGNOSIS — Z7984 Long term (current) use of oral hypoglycemic drugs: Secondary | ICD-10-CM | POA: Diagnosis not present

## 2019-01-10 DIAGNOSIS — M8580 Other specified disorders of bone density and structure, unspecified site: Secondary | ICD-10-CM | POA: Diagnosis not present

## 2019-01-10 DIAGNOSIS — Z6837 Body mass index (BMI) 37.0-37.9, adult: Secondary | ICD-10-CM | POA: Diagnosis not present

## 2019-01-10 DIAGNOSIS — E11319 Type 2 diabetes mellitus with unspecified diabetic retinopathy without macular edema: Secondary | ICD-10-CM | POA: Diagnosis not present

## 2019-01-10 DIAGNOSIS — E1122 Type 2 diabetes mellitus with diabetic chronic kidney disease: Secondary | ICD-10-CM | POA: Diagnosis not present

## 2019-01-10 DIAGNOSIS — N183 Chronic kidney disease, stage 3 unspecified: Secondary | ICD-10-CM | POA: Diagnosis not present

## 2019-01-10 DIAGNOSIS — Z9181 History of falling: Secondary | ICD-10-CM | POA: Diagnosis not present

## 2019-01-10 DIAGNOSIS — E1159 Type 2 diabetes mellitus with other circulatory complications: Secondary | ICD-10-CM | POA: Diagnosis not present

## 2019-01-10 DIAGNOSIS — I8390 Asymptomatic varicose veins of unspecified lower extremity: Secondary | ICD-10-CM | POA: Diagnosis not present

## 2019-01-10 DIAGNOSIS — E7849 Other hyperlipidemia: Secondary | ICD-10-CM | POA: Diagnosis not present

## 2019-01-10 DIAGNOSIS — Z8781 Personal history of (healed) traumatic fracture: Secondary | ICD-10-CM | POA: Diagnosis not present

## 2019-01-10 DIAGNOSIS — Z7982 Long term (current) use of aspirin: Secondary | ICD-10-CM | POA: Diagnosis not present

## 2019-01-10 DIAGNOSIS — G894 Chronic pain syndrome: Secondary | ICD-10-CM | POA: Diagnosis not present

## 2019-01-12 ENCOUNTER — Other Ambulatory Visit: Payer: Self-pay | Admitting: Internal Medicine

## 2019-01-12 DIAGNOSIS — E1159 Type 2 diabetes mellitus with other circulatory complications: Secondary | ICD-10-CM

## 2019-01-12 DIAGNOSIS — I152 Hypertension secondary to endocrine disorders: Secondary | ICD-10-CM

## 2019-01-14 DIAGNOSIS — E1159 Type 2 diabetes mellitus with other circulatory complications: Secondary | ICD-10-CM | POA: Diagnosis not present

## 2019-01-14 DIAGNOSIS — G894 Chronic pain syndrome: Secondary | ICD-10-CM | POA: Diagnosis not present

## 2019-01-14 DIAGNOSIS — I152 Hypertension secondary to endocrine disorders: Secondary | ICD-10-CM | POA: Diagnosis not present

## 2019-01-14 DIAGNOSIS — N170 Acute kidney failure with tubular necrosis: Secondary | ICD-10-CM | POA: Diagnosis not present

## 2019-01-14 DIAGNOSIS — I69398 Other sequelae of cerebral infarction: Secondary | ICD-10-CM | POA: Diagnosis not present

## 2019-01-14 DIAGNOSIS — R2689 Other abnormalities of gait and mobility: Secondary | ICD-10-CM | POA: Diagnosis not present

## 2019-01-22 ENCOUNTER — Telehealth: Payer: Self-pay | Admitting: Internal Medicine

## 2019-01-22 NOTE — Telephone Encounter (Signed)
Called pt, she stated she needed a refill of verapamil, reviewed visit notes, d'cd by dr Darrick Meigs 10/20. Also set next appt 1/12 w/ dr Darrick Meigs for f/u. Informed pt she should not be taking verapamil anymore and ask about BP's, she satted she has the "therapist" check it when they come to home, ask her to have them call with the readings, she was agreeable

## 2019-01-22 NOTE — Telephone Encounter (Signed)
Pls contact medicine, unable to understand the name of the medicine pt contact [phone]

## 2019-01-22 NOTE — Telephone Encounter (Signed)
Thank you :)

## 2019-02-03 ENCOUNTER — Telehealth: Payer: Self-pay | Admitting: *Deleted

## 2019-02-03 NOTE — Telephone Encounter (Signed)
Call from [facility] - asking about change in medication. Pt had stated Verapamil had been discontinued - wanting to know when. It was discontinue on 10/20/220 per chart.

## 2019-02-09 DIAGNOSIS — Z7982 Long term (current) use of aspirin: Secondary | ICD-10-CM | POA: Diagnosis not present

## 2019-02-09 DIAGNOSIS — G4700 Insomnia, unspecified: Secondary | ICD-10-CM | POA: Diagnosis not present

## 2019-02-09 DIAGNOSIS — E1122 Type 2 diabetes mellitus with diabetic chronic kidney disease: Secondary | ICD-10-CM | POA: Diagnosis not present

## 2019-02-09 DIAGNOSIS — E1159 Type 2 diabetes mellitus with other circulatory complications: Secondary | ICD-10-CM | POA: Diagnosis not present

## 2019-02-09 DIAGNOSIS — E7849 Other hyperlipidemia: Secondary | ICD-10-CM | POA: Diagnosis not present

## 2019-02-09 DIAGNOSIS — I152 Hypertension secondary to endocrine disorders: Secondary | ICD-10-CM | POA: Diagnosis not present

## 2019-02-09 DIAGNOSIS — E669 Obesity, unspecified: Secondary | ICD-10-CM | POA: Diagnosis not present

## 2019-02-09 DIAGNOSIS — Z6837 Body mass index (BMI) 37.0-37.9, adult: Secondary | ICD-10-CM | POA: Diagnosis not present

## 2019-02-09 DIAGNOSIS — I69398 Other sequelae of cerebral infarction: Secondary | ICD-10-CM | POA: Diagnosis not present

## 2019-02-09 DIAGNOSIS — E1136 Type 2 diabetes mellitus with diabetic cataract: Secondary | ICD-10-CM | POA: Diagnosis not present

## 2019-02-09 DIAGNOSIS — Z8781 Personal history of (healed) traumatic fracture: Secondary | ICD-10-CM | POA: Diagnosis not present

## 2019-02-09 DIAGNOSIS — N183 Chronic kidney disease, stage 3 unspecified: Secondary | ICD-10-CM | POA: Diagnosis not present

## 2019-02-09 DIAGNOSIS — I8390 Asymptomatic varicose veins of unspecified lower extremity: Secondary | ICD-10-CM | POA: Diagnosis not present

## 2019-02-09 DIAGNOSIS — Z9181 History of falling: Secondary | ICD-10-CM | POA: Diagnosis not present

## 2019-02-09 DIAGNOSIS — F410 Panic disorder [episodic paroxysmal anxiety] without agoraphobia: Secondary | ICD-10-CM | POA: Diagnosis not present

## 2019-02-09 DIAGNOSIS — M8580 Other specified disorders of bone density and structure, unspecified site: Secondary | ICD-10-CM | POA: Diagnosis not present

## 2019-02-09 DIAGNOSIS — R2689 Other abnormalities of gait and mobility: Secondary | ICD-10-CM | POA: Diagnosis not present

## 2019-02-09 DIAGNOSIS — J45909 Unspecified asthma, uncomplicated: Secondary | ICD-10-CM | POA: Diagnosis not present

## 2019-02-09 DIAGNOSIS — G894 Chronic pain syndrome: Secondary | ICD-10-CM | POA: Diagnosis not present

## 2019-02-09 DIAGNOSIS — N170 Acute kidney failure with tubular necrosis: Secondary | ICD-10-CM | POA: Diagnosis not present

## 2019-02-09 DIAGNOSIS — Z7984 Long term (current) use of oral hypoglycemic drugs: Secondary | ICD-10-CM | POA: Diagnosis not present

## 2019-02-09 DIAGNOSIS — E11319 Type 2 diabetes mellitus with unspecified diabetic retinopathy without macular edema: Secondary | ICD-10-CM | POA: Diagnosis not present

## 2019-02-09 DIAGNOSIS — E1169 Type 2 diabetes mellitus with other specified complication: Secondary | ICD-10-CM | POA: Diagnosis not present

## 2019-02-09 DIAGNOSIS — K219 Gastro-esophageal reflux disease without esophagitis: Secondary | ICD-10-CM | POA: Diagnosis not present

## 2019-02-11 ENCOUNTER — Encounter: Payer: Self-pay | Admitting: Physical Medicine & Rehabilitation

## 2019-02-11 ENCOUNTER — Other Ambulatory Visit: Payer: Self-pay

## 2019-02-11 ENCOUNTER — Encounter: Payer: Medicare Other | Attending: Physical Medicine & Rehabilitation | Admitting: Physical Medicine & Rehabilitation

## 2019-02-11 ENCOUNTER — Ambulatory Visit: Payer: Medicare Other | Admitting: Podiatry

## 2019-02-11 VITALS — Ht 64.0 in

## 2019-02-11 DIAGNOSIS — E11319 Type 2 diabetes mellitus with unspecified diabetic retinopathy without macular edema: Secondary | ICD-10-CM

## 2019-02-11 DIAGNOSIS — I69314 Frontal lobe and executive function deficit following cerebral infarction: Secondary | ICD-10-CM

## 2019-02-11 DIAGNOSIS — J45909 Unspecified asthma, uncomplicated: Secondary | ICD-10-CM

## 2019-02-11 DIAGNOSIS — I129 Hypertensive chronic kidney disease with stage 1 through stage 4 chronic kidney disease, or unspecified chronic kidney disease: Secondary | ICD-10-CM | POA: Diagnosis not present

## 2019-02-11 DIAGNOSIS — N189 Chronic kidney disease, unspecified: Secondary | ICD-10-CM

## 2019-02-11 DIAGNOSIS — E1122 Type 2 diabetes mellitus with diabetic chronic kidney disease: Secondary | ICD-10-CM | POA: Diagnosis not present

## 2019-02-11 DIAGNOSIS — I6340 Cerebral infarction due to embolism of unspecified cerebral artery: Secondary | ICD-10-CM
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
## 2019-02-20 ENCOUNTER — Telehealth: Payer: Self-pay | Admitting: *Deleted

## 2019-02-20 NOTE — Telephone Encounter (Signed)
PCS form received from [place] at [facility]. PCS form was completed and faxed to [facility] on 12/05/2018. Spoke with [place] at [place] who stated they received that form, however, medicaid number was incorrect. This part of the form had been completed by [facility]. Since we are still within the 90 day window, Jasmine said it is allowable to correct the original form and resubmit. This has been done and faxed to [facility] at [phone]. Hubbard Hartshorn, BSN, RN-BC

## 2019-02-24 ENCOUNTER — Other Ambulatory Visit: Payer: Self-pay

## 2019-02-24 ENCOUNTER — Encounter: Payer: Self-pay | Admitting: Internal Medicine

## 2019-02-24 ENCOUNTER — Encounter: Payer: Medicare Other | Admitting: Internal Medicine

## 2019-02-24 ENCOUNTER — Ambulatory Visit (INDEPENDENT_AMBULATORY_CARE_PROVIDER_SITE_OTHER): Payer: Medicare Other | Admitting: Internal Medicine

## 2019-02-24 DIAGNOSIS — I10 Essential (primary) hypertension: Secondary | ICD-10-CM

## 2019-02-24 DIAGNOSIS — Z0000 Encounter for general adult medical examination without abnormal findings: Secondary | ICD-10-CM

## 2019-02-24 DIAGNOSIS — E1159 Type 2 diabetes mellitus with other circulatory complications: Secondary | ICD-10-CM

## 2019-02-24 DIAGNOSIS — F329 Major depressive disorder, single episode, unspecified: Secondary | ICD-10-CM

## 2019-02-24 DIAGNOSIS — Z794 Long term (current) use of insulin: Secondary | ICD-10-CM | POA: Diagnosis not present

## 2019-02-24 DIAGNOSIS — E113593 Type 2 diabetes mellitus with proliferative diabetic retinopathy without macular edema, bilateral: Secondary | ICD-10-CM | POA: Diagnosis not present

## 2019-02-24 DIAGNOSIS — I152 Hypertension secondary to endocrine disorders: Secondary | ICD-10-CM

## 2019-02-24 LAB — POCT GLYCOSYLATED HEMOGLOBIN (HGB A1C): Hemoglobin A1C: 7.2 % — AB (ref 4.0–5.6)

## 2019-02-24 LAB — GLUCOSE, CAPILLARY: Glucose-Capillary: 212 mg/dL — ABNORMAL HIGH (ref 70–99)

## 2019-02-24 NOTE — Patient Instructions (Signed)
It was a pleasure seeing you today, Chelsea [name]. Please follow up in 3 months. Please feel free to reach out me sooner if anything comes up in the meantime. You can contact me via MyChart or calling the clinic.  Take care!

## 2019-02-25 MED ORDER — AMLODIPINE BESYLATE 5 MG PO TABS: 5.0000 mg | ORAL_TABLET | Freq: Every day | ORAL | 1 refills | Status: DC

## 2019-02-25 MED ORDER — CITALOPRAM HYDROBROMIDE 20 MG PO TABS: 20.0000 mg | ORAL_TABLET | Freq: Every day | ORAL | 4 refills | Status: DC

## 2019-02-25 MED ORDER — LOSARTAN POTASSIUM-HCTZ 100-12.5 MG PO TABS: 1.0000 | ORAL_TABLET | Freq: Every day | ORAL | 5 refills | Status: DC

## 2019-02-25 MED ORDER — ATORVASTATIN CALCIUM 80 MG PO TABS: 80.0000 mg | ORAL_TABLET | Freq: Every day | ORAL | 3 refills | Status: DC

## 2019-02-25 MED ORDER — SPIRONOLACTONE 50 MG PO TABS: 50.0000 mg | ORAL_TABLET | Freq: Every day | ORAL | 1 refills | Status: DC

## 2019-02-25 MED ORDER — ASPIRIN 81 MG PO TBEC: 81.0000 mg | DELAYED_RELEASE_TABLET | Freq: Every day | ORAL | 11 refills | Status: DC

## 2019-02-25 NOTE — Assessment & Plan Note (Signed)
Current medications: citalopram 20mg  Patient notes good compliance with medications.  She does also endorse some depressive symptoms due to several ill family members. Denies suicidal thoughts. Discussed option to increase celexa dose however patient would like to hold off on this.   Plan: continue current regimen.

## 2019-02-25 NOTE — Assessment & Plan Note (Signed)
Discussed importance of healthcare maintenance and screening. Patient refusing at this time. Will continue to encourage at future visits.

## 2019-02-25 NOTE — Progress Notes (Signed)
Internal Medicine Clinic Attending  Case discussed with Dr. Christian at the time of the visit.  We reviewed the resident's history and exam and pertinent patient test results.  I agree with the assessment, diagnosis, and plan of care documented in the resident's note.    

## 2019-02-25 NOTE — Assessment & Plan Note (Addendum)
69 y.o. female presenting for DM follow up. Anti-hyperglycemic agents: metformin 500mg  Secondary agents: losartan-hctz, atorvastatin Taking medication compliantly without noted sided effects. No hypoglycemic events noted. Follows diet fairly well. Home glucose monitor does show some fairly elevated blood sugars ranging in 180s to 200s for the most part--there are a few that are in the 300s.   A1C today 7.2  Disease control: needs improvement Due for eye exam Foot exam performed  Assessment: Interval increase in A1C since last check. If further elevated at next visit, will have to consider medication adjustment.  Plan: continue current management Follow up 3 mo with A1C recheck

## 2019-02-25 NOTE — Assessment & Plan Note (Addendum)
Medications:amlodipine 5mg , losartan-hctz 100-12.5mg  daily Pt reports compliance with medication. Denies side effects. Office blood pressures have been fairly elevated over past few visits. At last visit, I asked her to log her home blood pressures which she brought with to today's visit. Upon review, SBPs are ranging from 120s-130s at home.  Plan Continue current treatment regimen. recheck labs at next visit.
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
## 2019-03-11 DIAGNOSIS — E1159 Type 2 diabetes mellitus with other circulatory complications: Secondary | ICD-10-CM | POA: Diagnosis not present

## 2019-03-11 DIAGNOSIS — E1169 Type 2 diabetes mellitus with other specified complication: Secondary | ICD-10-CM | POA: Diagnosis not present

## 2019-03-11 DIAGNOSIS — Z8781 Personal history of (healed) traumatic fracture: Secondary | ICD-10-CM | POA: Diagnosis not present

## 2019-03-11 DIAGNOSIS — I8390 Asymptomatic varicose veins of unspecified lower extremity: Secondary | ICD-10-CM | POA: Diagnosis not present

## 2019-03-11 DIAGNOSIS — I69398 Other sequelae of cerebral infarction: Secondary | ICD-10-CM | POA: Diagnosis not present

## 2019-03-11 DIAGNOSIS — G4700 Insomnia, unspecified: Secondary | ICD-10-CM | POA: Diagnosis not present

## 2019-03-11 DIAGNOSIS — F410 Panic disorder [episodic paroxysmal anxiety] without agoraphobia: Secondary | ICD-10-CM | POA: Diagnosis not present

## 2019-03-11 DIAGNOSIS — R2689 Other abnormalities of gait and mobility: Secondary | ICD-10-CM | POA: Diagnosis not present

## 2019-03-11 DIAGNOSIS — M8580 Other specified disorders of bone density and structure, unspecified site: Secondary | ICD-10-CM | POA: Diagnosis not present

## 2019-03-11 DIAGNOSIS — I152 Hypertension secondary to endocrine disorders: Secondary | ICD-10-CM | POA: Diagnosis not present

## 2019-03-11 DIAGNOSIS — Z9181 History of falling: Secondary | ICD-10-CM | POA: Diagnosis not present

## 2019-03-11 DIAGNOSIS — E669 Obesity, unspecified: Secondary | ICD-10-CM | POA: Diagnosis not present

## 2019-03-11 DIAGNOSIS — J45909 Unspecified asthma, uncomplicated: Secondary | ICD-10-CM | POA: Diagnosis not present

## 2019-03-11 DIAGNOSIS — Z7982 Long term (current) use of aspirin: Secondary | ICD-10-CM | POA: Diagnosis not present

## 2019-03-11 DIAGNOSIS — N183 Chronic kidney disease, stage 3 unspecified: Secondary | ICD-10-CM | POA: Diagnosis not present

## 2019-03-11 DIAGNOSIS — N170 Acute kidney failure with tubular necrosis: Secondary | ICD-10-CM | POA: Diagnosis not present

## 2019-03-11 DIAGNOSIS — E1122 Type 2 diabetes mellitus with diabetic chronic kidney disease: Secondary | ICD-10-CM | POA: Diagnosis not present

## 2019-03-11 DIAGNOSIS — Z7984 Long term (current) use of oral hypoglycemic drugs: Secondary | ICD-10-CM | POA: Diagnosis not present

## 2019-03-11 DIAGNOSIS — E11319 Type 2 diabetes mellitus with unspecified diabetic retinopathy without macular edema: Secondary | ICD-10-CM | POA: Diagnosis not present

## 2019-03-11 DIAGNOSIS — E1136 Type 2 diabetes mellitus with diabetic cataract: Secondary | ICD-10-CM | POA: Diagnosis not present

## 2019-03-11 DIAGNOSIS — E7849 Other hyperlipidemia: Secondary | ICD-10-CM | POA: Diagnosis not present

## 2019-03-11 DIAGNOSIS — Z6837 Body mass index (BMI) 37.0-37.9, adult: Secondary | ICD-10-CM | POA: Diagnosis not present

## 2019-03-11 DIAGNOSIS — G894 Chronic pain syndrome: Secondary | ICD-10-CM | POA: Diagnosis not present

## 2019-03-11 DIAGNOSIS — K219 Gastro-esophageal reflux disease without esophagitis: Secondary | ICD-10-CM | POA: Diagnosis not present

## 2019-03-12 ENCOUNTER — Other Ambulatory Visit: Payer: Self-pay | Admitting: Internal Medicine

## 2019-03-12 ENCOUNTER — Telehealth: Payer: Self-pay | Admitting: *Deleted

## 2019-03-12 ENCOUNTER — Telehealth: Payer: Self-pay | Admitting: Internal Medicine

## 2019-03-12 DIAGNOSIS — G4700 Insomnia, unspecified: Secondary | ICD-10-CM

## 2019-03-12 DIAGNOSIS — E113593 Type 2 diabetes mellitus with proliferative diabetic retinopathy without macular edema, bilateral: Secondary | ICD-10-CM

## 2019-03-12 MED ORDER — TRAZODONE HCL 50 MG PO TABS: 25.0000 mg | ORAL_TABLET | Freq: Every day | ORAL | 0 refills | Status: DC

## 2019-03-12 NOTE — Telephone Encounter (Signed)
Pt calls and states she just lost her 2nd sister yesterday and she is not resting at all. 1 sister last week and 1 yesterday, she is anxious, distraught and just not sleeping. She wants to know if you could prescribe something to calm her down. She just wants something temporary.

## 2019-03-12 NOTE — Telephone Encounter (Signed)
Pt wants to the nurse to call back wanting to talk about medicine [phone]

## 2019-03-12 NOTE — Telephone Encounter (Signed)
Called pt, informed her of trazadone and directions, if it does not help she will call back in 3 to 7 days for an appt

## 2019-03-12 NOTE — Telephone Encounter (Signed)
Will send in a script for trazodone. Please have her come in if that is not working. Thanks.

## 2019-03-12 NOTE — Telephone Encounter (Signed)
Rtc, lm for rtc 

## 2019-03-13 ENCOUNTER — Telehealth: Payer: Self-pay | Admitting: *Deleted

## 2019-03-13 NOTE — Telephone Encounter (Signed)
Call from pt - stated she was prescribedTrazodone to help her sleep and she's also on Celexa. She wanted to know if she can take both. Advised pt it's ok to take both medications. Please advise if not correct.

## 2019-03-13 NOTE — Telephone Encounter (Signed)
I agree

## 2019-03-16 ENCOUNTER — Other Ambulatory Visit: Payer: Self-pay | Admitting: *Deleted

## 2019-03-16 DIAGNOSIS — Z794 Long term (current) use of insulin: Secondary | ICD-10-CM

## 2019-03-16 DIAGNOSIS — E113593 Type 2 diabetes mellitus with proliferative diabetic retinopathy without macular edema, bilateral: Secondary | ICD-10-CM

## 2019-03-17 MED ORDER — METFORMIN HCL ER 500 MG PO TB24: ORAL_TABLET | ORAL | 2 refills | Status: DC

## 2019-03-20 ENCOUNTER — Other Ambulatory Visit: Payer: Self-pay | Admitting: Internal Medicine

## 2019-03-20 DIAGNOSIS — G4700 Insomnia, unspecified: Secondary | ICD-10-CM

## 2019-03-20 NOTE — Telephone Encounter (Signed)
Needs refill on traZODone (DESYREL) 50 MG tablet  ;pt contact [facility] [place], [place] - [phone]

## 2019-03-20 NOTE — Telephone Encounter (Signed)
Opened in error. [place]

## 2019-03-20 NOTE — Telephone Encounter (Signed)
Pt states she needs 1 tablet usually at night 1/2 just doesn't work

## 2019-04-08 ENCOUNTER — Other Ambulatory Visit: Payer: Self-pay | Admitting: Internal Medicine

## 2019-04-08 DIAGNOSIS — G894 Chronic pain syndrome: Secondary | ICD-10-CM | POA: Diagnosis not present

## 2019-04-08 DIAGNOSIS — G4700 Insomnia, unspecified: Secondary | ICD-10-CM

## 2019-04-08 DIAGNOSIS — K219 Gastro-esophageal reflux disease without esophagitis: Secondary | ICD-10-CM

## 2019-04-08 DIAGNOSIS — R2689 Other abnormalities of gait and mobility: Secondary | ICD-10-CM | POA: Diagnosis not present

## 2019-04-08 DIAGNOSIS — N170 Acute kidney failure with tubular necrosis: Secondary | ICD-10-CM | POA: Diagnosis not present

## 2019-04-08 DIAGNOSIS — E1169 Type 2 diabetes mellitus with other specified complication: Secondary | ICD-10-CM | POA: Diagnosis not present

## 2019-04-08 DIAGNOSIS — Z8781 Personal history of (healed) traumatic fracture: Secondary | ICD-10-CM

## 2019-04-08 DIAGNOSIS — Z6837 Body mass index (BMI) 37.0-37.9, adult: Secondary | ICD-10-CM

## 2019-04-08 DIAGNOSIS — E1136 Type 2 diabetes mellitus with diabetic cataract: Secondary | ICD-10-CM | POA: Diagnosis not present

## 2019-04-08 DIAGNOSIS — E7849 Other hyperlipidemia: Secondary | ICD-10-CM | POA: Diagnosis not present

## 2019-04-08 DIAGNOSIS — Z7982 Long term (current) use of aspirin: Secondary | ICD-10-CM

## 2019-04-08 DIAGNOSIS — I8390 Asymptomatic varicose veins of unspecified lower extremity: Secondary | ICD-10-CM

## 2019-04-08 DIAGNOSIS — J45909 Unspecified asthma, uncomplicated: Secondary | ICD-10-CM

## 2019-04-08 DIAGNOSIS — Z7984 Long term (current) use of oral hypoglycemic drugs: Secondary | ICD-10-CM

## 2019-04-08 DIAGNOSIS — M8580 Other specified disorders of bone density and structure, unspecified site: Secondary | ICD-10-CM

## 2019-04-08 DIAGNOSIS — I152 Hypertension secondary to endocrine disorders: Secondary | ICD-10-CM | POA: Diagnosis not present

## 2019-04-08 DIAGNOSIS — I69398 Other sequelae of cerebral infarction: Secondary | ICD-10-CM | POA: Diagnosis not present

## 2019-04-08 DIAGNOSIS — F410 Panic disorder [episodic paroxysmal anxiety] without agoraphobia: Secondary | ICD-10-CM

## 2019-04-08 DIAGNOSIS — Z9181 History of falling: Secondary | ICD-10-CM

## 2019-04-08 DIAGNOSIS — E1122 Type 2 diabetes mellitus with diabetic chronic kidney disease: Secondary | ICD-10-CM | POA: Diagnosis not present

## 2019-04-08 DIAGNOSIS — N183 Chronic kidney disease, stage 3 unspecified: Secondary | ICD-10-CM | POA: Diagnosis not present

## 2019-04-08 DIAGNOSIS — E1159 Type 2 diabetes mellitus with other circulatory complications: Secondary | ICD-10-CM | POA: Diagnosis not present

## 2019-04-08 DIAGNOSIS — E11319 Type 2 diabetes mellitus with unspecified diabetic retinopathy without macular edema: Secondary | ICD-10-CM | POA: Diagnosis not present

## 2019-04-08 DIAGNOSIS — E669 Obesity, unspecified: Secondary | ICD-10-CM

## 2019-04-10 DIAGNOSIS — K219 Gastro-esophageal reflux disease without esophagitis: Secondary | ICD-10-CM | POA: Diagnosis not present

## 2019-04-10 DIAGNOSIS — E1136 Type 2 diabetes mellitus with diabetic cataract: Secondary | ICD-10-CM | POA: Diagnosis not present

## 2019-04-10 DIAGNOSIS — G4700 Insomnia, unspecified: Secondary | ICD-10-CM | POA: Diagnosis not present

## 2019-04-10 DIAGNOSIS — Z7982 Long term (current) use of aspirin: Secondary | ICD-10-CM | POA: Diagnosis not present

## 2019-04-10 DIAGNOSIS — E669 Obesity, unspecified: Secondary | ICD-10-CM | POA: Diagnosis not present

## 2019-04-10 DIAGNOSIS — E1122 Type 2 diabetes mellitus with diabetic chronic kidney disease: Secondary | ICD-10-CM | POA: Diagnosis not present

## 2019-04-10 DIAGNOSIS — E11319 Type 2 diabetes mellitus with unspecified diabetic retinopathy without macular edema: Secondary | ICD-10-CM | POA: Diagnosis not present

## 2019-04-10 DIAGNOSIS — E7849 Other hyperlipidemia: Secondary | ICD-10-CM | POA: Diagnosis not present

## 2019-04-10 DIAGNOSIS — M8580 Other specified disorders of bone density and structure, unspecified site: Secondary | ICD-10-CM | POA: Diagnosis not present

## 2019-04-10 DIAGNOSIS — I152 Hypertension secondary to endocrine disorders: Secondary | ICD-10-CM | POA: Diagnosis not present

## 2019-04-10 DIAGNOSIS — I69398 Other sequelae of cerebral infarction: Secondary | ICD-10-CM | POA: Diagnosis not present

## 2019-04-10 DIAGNOSIS — G894 Chronic pain syndrome: Secondary | ICD-10-CM | POA: Diagnosis not present

## 2019-04-10 DIAGNOSIS — Z9181 History of falling: Secondary | ICD-10-CM | POA: Diagnosis not present

## 2019-04-10 DIAGNOSIS — N170 Acute kidney failure with tubular necrosis: Secondary | ICD-10-CM | POA: Diagnosis not present

## 2019-04-10 DIAGNOSIS — E1169 Type 2 diabetes mellitus with other specified complication: Secondary | ICD-10-CM | POA: Diagnosis not present

## 2019-04-10 DIAGNOSIS — R2689 Other abnormalities of gait and mobility: Secondary | ICD-10-CM | POA: Diagnosis not present

## 2019-04-10 DIAGNOSIS — E1159 Type 2 diabetes mellitus with other circulatory complications: Secondary | ICD-10-CM | POA: Diagnosis not present

## 2019-04-10 DIAGNOSIS — Z7984 Long term (current) use of oral hypoglycemic drugs: Secondary | ICD-10-CM | POA: Diagnosis not present

## 2019-04-10 DIAGNOSIS — Z8781 Personal history of (healed) traumatic fracture: Secondary | ICD-10-CM | POA: Diagnosis not present

## 2019-04-10 DIAGNOSIS — F410 Panic disorder [episodic paroxysmal anxiety] without agoraphobia: Secondary | ICD-10-CM | POA: Diagnosis not present

## 2019-04-10 DIAGNOSIS — I8390 Asymptomatic varicose veins of unspecified lower extremity: Secondary | ICD-10-CM | POA: Diagnosis not present

## 2019-04-10 DIAGNOSIS — Z6837 Body mass index (BMI) 37.0-37.9, adult: Secondary | ICD-10-CM | POA: Diagnosis not present

## 2019-04-10 DIAGNOSIS — N183 Chronic kidney disease, stage 3 unspecified: Secondary | ICD-10-CM | POA: Diagnosis not present

## 2019-04-10 DIAGNOSIS — J45909 Unspecified asthma, uncomplicated: Secondary | ICD-10-CM | POA: Diagnosis not present

## 2019-05-12 DIAGNOSIS — E119 Type 2 diabetes mellitus without complications: Secondary | ICD-10-CM | POA: Diagnosis not present

## 2019-05-12 DIAGNOSIS — H16141 Punctate keratitis, right eye: Secondary | ICD-10-CM | POA: Diagnosis not present

## 2019-05-12 DIAGNOSIS — Z961 Presence of intraocular lens: Secondary | ICD-10-CM | POA: Diagnosis not present

## 2019-05-14 ENCOUNTER — Other Ambulatory Visit: Payer: Self-pay | Admitting: Internal Medicine

## 2019-05-14 DIAGNOSIS — G4700 Insomnia, unspecified: Secondary | ICD-10-CM

## 2019-06-10 ENCOUNTER — Other Ambulatory Visit: Payer: Self-pay | Admitting: Internal Medicine

## 2019-06-10 DIAGNOSIS — E113593 Type 2 diabetes mellitus with proliferative diabetic retinopathy without macular edema, bilateral: Secondary | ICD-10-CM

## 2019-06-10 DIAGNOSIS — Z794 Long term (current) use of insulin: Secondary | ICD-10-CM

## 2019-06-24 ENCOUNTER — Other Ambulatory Visit: Payer: Self-pay | Admitting: Internal Medicine

## 2019-06-24 DIAGNOSIS — G4700 Insomnia, unspecified: Secondary | ICD-10-CM

## 2019-07-27 ENCOUNTER — Telehealth: Payer: Self-pay | Admitting: Dietician

## 2019-07-27 DIAGNOSIS — E113593 Type 2 diabetes mellitus with proliferative diabetic retinopathy without macular edema, bilateral: Secondary | ICD-10-CM

## 2019-07-28 ENCOUNTER — Other Ambulatory Visit: Payer: Self-pay | Admitting: *Deleted

## 2019-07-28 DIAGNOSIS — E113593 Type 2 diabetes mellitus with proliferative diabetic retinopathy without macular edema, bilateral: Secondary | ICD-10-CM

## 2019-07-28 DIAGNOSIS — Z794 Long term (current) use of insulin: Secondary | ICD-10-CM

## 2019-07-28 MED ORDER — ACCU-CHEK GUIDE ME W/DEVICE KIT: PACK | 0 refills | Status: DC

## 2019-07-28 MED ORDER — ACCU-CHEK SOFTCLIX LANCETS MISC: 3 refills | Status: DC

## 2019-07-28 MED ORDER — ACCU-CHEK GUIDE VI STRP: ORAL_STRIP | 3 refills | Status: DC

## 2019-07-28 NOTE — Telephone Encounter (Signed)
"  sugar messing up again". Taking diabetes medicines, not hurting blood sugars in the 200s and 300s. Advised she Drink plenty of water. Eat lower carb foods. She reports she is not eating sweets "like I was". Loves fruit, but not eating a whole lot at one time. Requests new machine, strips and lancets. Pt has made an appointment for next Tuesday.

## 2019-07-28 NOTE — Telephone Encounter (Signed)
Will resend, but Tana Conch, a DEA for diabetic supplies- what has the world come to?

## 2019-08-04 ENCOUNTER — Other Ambulatory Visit: Payer: Self-pay

## 2019-08-04 ENCOUNTER — Ambulatory Visit (INDEPENDENT_AMBULATORY_CARE_PROVIDER_SITE_OTHER): Payer: Medicare Other | Admitting: Internal Medicine

## 2019-08-04 ENCOUNTER — Encounter: Payer: Self-pay | Admitting: Internal Medicine

## 2019-08-04 ENCOUNTER — Telehealth: Payer: Self-pay | Admitting: Dietician

## 2019-08-04 VITALS — BP 168/66 | HR 89 | Temp 98.2°F | Ht 64.0 in | Wt 205.9 lb

## 2019-08-04 DIAGNOSIS — Z794 Long term (current) use of insulin: Secondary | ICD-10-CM | POA: Diagnosis not present

## 2019-08-04 DIAGNOSIS — E113593 Type 2 diabetes mellitus with proliferative diabetic retinopathy without macular edema, bilateral: Secondary | ICD-10-CM

## 2019-08-04 DIAGNOSIS — E1159 Type 2 diabetes mellitus with other circulatory complications: Secondary | ICD-10-CM | POA: Diagnosis not present

## 2019-08-04 DIAGNOSIS — I10 Essential (primary) hypertension: Secondary | ICD-10-CM | POA: Diagnosis not present

## 2019-08-04 DIAGNOSIS — I152 Hypertension secondary to endocrine disorders: Secondary | ICD-10-CM

## 2019-08-04 LAB — POCT GLYCOSYLATED HEMOGLOBIN (HGB A1C): Hemoglobin A1C: 12.5 % — AB (ref 4.0–5.6)

## 2019-08-04 LAB — GLUCOSE, CAPILLARY: Glucose-Capillary: 324 mg/dL — ABNORMAL HIGH (ref 70–99)

## 2019-08-04 MED ORDER — ACCU-CHEK GUIDE VI STRP: ORAL_STRIP | 3 refills | Status: DC

## 2019-08-04 MED ORDER — ACCU-CHEK SOFTCLIX LANCETS MISC: 3 refills | Status: DC

## 2019-08-04 MED ORDER — VICTOZA 18 MG/3ML ~~LOC~~ SOPN: 1.2000 mg | PEN_INJECTOR | Freq: Every day | SUBCUTANEOUS | 2 refills | Status: DC

## 2019-08-04 MED ORDER — AMLODIPINE BESYLATE 10 MG PO TABS: 10.0000 mg | ORAL_TABLET | Freq: Every day | ORAL | 3 refills | Status: DC

## 2019-08-04 NOTE — Assessment & Plan Note (Addendum)
BP Readings from Last 3 Encounters:  08/04/19 (!) 168/66  02/24/19 (!) 162/69  12/02/18 (!) 154/45   Patient states she has been taking her BP at home as well. It generally ranges around 150 SBP. She denies any difficulty with her medication. Denies headache, dizziness, chest pain, SOB.   Assessment/Plan:  Uncontrolled at this time.   - Amlodipine increased from 5 mg to 10 mg  - Continue Losartan-HCTZ 100-50 mg daily and Spironolactone 50 mg daily

## 2019-08-04 NOTE — Telephone Encounter (Signed)
Gave patient a new Accu chek Guideme meter and educated she and her daughter who was in her room about how to use it. Called [facility] who said the previous prescription sent by Dr. Darrick Meigs was no accepted because she is not recognized as a Medicare provider. They need the prescription sent again with a different provider. She is being asked to check her blood sugar two times a day due to her elevated A1C.

## 2019-08-04 NOTE — Patient Instructions (Addendum)
It was nice seeing you today! Thank you for choosing [facility] for your Primary Care.    Today we talked about:   1. Diabetes: Your blood sugar has increased, and so we will be adding another medication on called Victoza. It is a once per day injection. Please start with the reduced dose as instructed on the box for the first 1 week to avoid upset stomach. Please start checking your sugars daily, at least once in the morning.  2. High blood pressure: Today, I have increased your dose of Amlodipine from 5 mg to 10 mg.

## 2019-08-04 NOTE — Progress Notes (Signed)
Internal Medicine Clinic Attending  Case discussed with Dr. Charleen Kirks at the time of the visit.  We reviewed the resident's history and exam and pertinent patient test results.  I agree with the assessment, diagnosis, and plan of care documented in the resident's note.   Unable to up titrate metformin due to CKD, recently taken off insulin due to asymptomatic hypoglycemia. Agree with initiating GLP-1 agonist, emphasize lifestyle changes. If unable to reach Hgb A1c goal then may need to add back low dose long acting insulin.
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
## 2019-08-04 NOTE — Assessment & Plan Note (Addendum)
Ms. Reimann states her sugars have been running higher progressively since her insulin regimen was stopped back in the fall. She denies any polyuria or polydipsia. She is concerned that her diabetes is becoming uncontrolled again though.   Her daughter at bedside expresses concern about patient's diet; states she eats candy and chips daily. Ms. Hanel endorses some chips in her diet but states they are the baked kind. She denies eating too many fruit.   Assessment/Plan:  Approximately 9 months ago, patient was taken off of Lantus and continued on just Metformin. In that time, she has had interval increases in her a1c and CBGs at home with a large jump today to 12%. Likely secondary to a combination of poor diet and de-escalation in medications.   Likely will need re-initiation of insulin in the future however she has a history of hypoglycemia on Lantus. Today, will start with Victoza for the cardiac/renal protection. In 1 month, if tolerating Victoza, she would benefit from switching to [facility] for the combination of insulin/GLP1. Less risk of hypoglycemia with Xultophy given longer acting insulin.   - A1c, BMP today - Continue Metformin 500 mg once daily  - Start Victoza 0.6 mg daily for 1 week, then increase to 1.2 mg daily  - Accu-check Guide me provided in clinic, supplies sent to pharmacy - Referral to [name], diabetes educator, for diet education   - Referral to Podiatry for routine foot care

## 2019-08-05 ENCOUNTER — Telehealth: Payer: Self-pay | Admitting: Dietician

## 2019-08-05 ENCOUNTER — Telehealth: Payer: Self-pay | Admitting: Internal Medicine

## 2019-08-05 LAB — BMP8+ANION GAP
Anion Gap: 15 mmol/L (ref 10.0–18.0)
BUN/Creatinine Ratio: 21 (ref 12–28)
BUN: 33 mg/dL — ABNORMAL HIGH (ref 8–27)
CO2: 20 mmol/L (ref 20–29)
Calcium: 8.7 mg/dL (ref 8.7–10.3)
Chloride: 98 mmol/L (ref 96–106)
Creatinine, Ser: 1.57 mg/dL — ABNORMAL HIGH (ref 0.57–1.00)
GFR calc Af Amer: 38 mL/min/{1.73_m2} — ABNORMAL LOW (ref >59)
GFR calc non Af Amer: 33 mL/min/{1.73_m2} — ABNORMAL LOW (ref >59)
Glucose: 338 mg/dL — ABNORMAL HIGH (ref 65–99)
Potassium: 4.9 mmol/L (ref 3.5–5.2)
Sodium: 133 mmol/L — ABNORMAL LOW (ref 134–144)

## 2019-08-05 NOTE — Telephone Encounter (Signed)
Chelsea [name] called saying she is Having a difficult time working her meter. Daughter says she is refusing to let her check her blood sugar or give her the victoza.. Daughter verbalized good understanding of using the meter and what the victoza dose is. Chelsea [name] was tearful when she came back on the phone for me to encourage her to let her daughter help her saying she is having a bad day and will be better tomorrow. [name] Kitchen

## 2019-08-05 NOTE — Telephone Encounter (Signed)
   Reason for call:   I received a call from [name] duaghter at 12:50 PM indicating confusion about Victoza dosing.   Pertinent Data:   She reports she had hand written instructions to take Victoza "Insulin" at 0.1mg  Daily for one week, then 0.2 Daily after that and stated that the pen started at 0.6.   Assessment / Plan / Recommendations:   I explained that Victoza is not an insulin, but a different type of injectable diabetes medication. I also informed her that she should be taking 0.6mg  Daily for the first week and then 1.2mg  Daily after this. These instructions were in the note from her visit yesterday, likely there was an issue with reading handwriting.    All Questions answered.   Neva Seat, MD   08/05/2019, 12:52 PM

## 2019-08-14 ENCOUNTER — Telehealth: Payer: Self-pay | Admitting: Dietician

## 2019-08-14 NOTE — Telephone Encounter (Signed)
Asked for help in getting more strips and lancets for her meter. Called [facility] and asked them to cancel fastclicx lancets and get the softclix lancets and guide strips ready.

## 2019-09-09 ENCOUNTER — Ambulatory Visit (INDEPENDENT_AMBULATORY_CARE_PROVIDER_SITE_OTHER): Payer: Medicare Other | Admitting: Dietician

## 2019-09-09 ENCOUNTER — Telehealth: Payer: Self-pay | Admitting: Dietician

## 2019-09-09 ENCOUNTER — Other Ambulatory Visit: Payer: Self-pay

## 2019-09-09 ENCOUNTER — Encounter: Payer: Medicare Other | Admitting: Student

## 2019-09-09 ENCOUNTER — Encounter: Payer: Self-pay | Admitting: Dietician

## 2019-09-09 DIAGNOSIS — Z794 Long term (current) use of insulin: Secondary | ICD-10-CM | POA: Diagnosis not present

## 2019-09-09 DIAGNOSIS — E113593 Type 2 diabetes mellitus with proliferative diabetic retinopathy without macular edema, bilateral: Secondary | ICD-10-CM

## 2019-09-09 MED ORDER — PEN NEEDLES 32G X 4 MM MISC: 3 refills | Status: DC

## 2019-09-09 NOTE — Progress Notes (Signed)
  Medical Nutrition Therapy Via Telemedicine (Telephone):   Start time: 10:35 am  End time: 10:45 am Total time: 10 minutes Visit # 1  Telephone visit due to COVID-19. I connected with  Chelsea [name] on 09/09/19 by a video enabled telemedicine application and verified that I am speaking with the correct person using two identifiers.   I discussed the limitations of evaluation and management by telemedicine. The patient expressed understanding and agreed to proceed.   Assessment:  Primary concerns today: new to victoza and elevated A1C Chelsea [name] had well controlled diabetes until recently despite her weight loss prior to the initiation of a GLP-1 RA. She has been on insulin in the recent past, so starting back on an injection daily was not new to her. She follows a healthy meal plan for the most part and does as much activity as she can with her hemiparesis from her stroke.  Preferred Learning Style: No preference indicated  Learning Readiness: Ready and Change in progress  ANTHROPOMETRICS:  Estimated body mass index is 35.34 kg/m as calculated from the following:   Height as of 08/04/19: 5\' 4"  (1.626 m).   Weight as of 08/04/19: 205 lb 14.4 oz (93.4 kg).  WEIGHT HISTORY: Wt Readings from Last 10 Encounters:  08/04/19 205 lb 14.4 oz (93.4 kg)  02/24/19 225 lb 11.2 oz (102.4 kg)  12/02/18 222 lb 3.2 oz (100.8 kg)  11/06/18 218 lb 8 oz (99.1 kg)  11/05/18 220 lb (99.8 kg)  10/23/18 220 lb 12.8 oz (100.2 kg)  10/09/18 216 lb 6.4 oz (98.2 kg)  08/28/18 220 lb 4.8 oz (99.9 kg)  08/05/18 222 lb 3.2 oz (100.8 kg)  07/15/18 225 lb 1.6 oz (102.1 kg)    MEDICATIONS:  taking Victoza 1.2 mg daily; no side affects except lower blood sugars and not as hungry;  Needs pen needle prescription sent to [facility] [facility] reports:Blood sugars- [phone] checks once daily between 1-2 pm Lab Results  Component Value Date   HGBA1C 12.5 (A) 08/04/2019   HGBA1C 7.2 (A) 02/24/2019   HGBA1C  5.8 (A) 11/06/2018   HGBA1C 5.8 (A) 08/28/2018   HGBA1C 6.3 (A) 02/19/2018     DIETARY INTAKE: Usual eating pattern includes 3 meals and 0-1 snacks per day.Appetite decreased which is okay with her. She states that she still eats well and would like to decrease her weight. Reminded her to eat her fruits, vegetables whole grains, proteins and water first. recommend multivitain daily as she is on term metformin  Usual physical activity: limited to in the house due to her stroke  Progress Towards Goal(s):  In progress.   Nutritional Diagnosis:  [place]-2.4 Predicted food-medication interaction As related to decreased appetite after starting a GLP-1 RA to lower her blood sugar.  As evidenced by her report.    Intervention:  Nutrition education about maintaining a healthy diet despite decreased appetite. Action Goal: start multivitamin  With minerals daily  Outcome goal: improved blood sugars with only moderate 5%(~10# maximum) weight loss Coordination of care: request prescription per patient for pen needles  Teaching Method Utilized: Visual, Auditory,Hands on Handouts given during visit include:no Barriers to learning/adherence to lifestyle change: pandemic, depends on family for transporation Demonstrated degree of understanding via:  Teach Back   Monitoring/Evaluation:  Dietary intake, exercise,meter or blood sugar readings, and body weight in 4 week(s). Chelsea [name] Chelsea [name], RD 09/09/2019 11:09 AM.

## 2019-09-09 NOTE — Telephone Encounter (Signed)
Patient request pen needles. ?

## 2019-09-10 NOTE — Addendum Note (Signed)
Addended by: Hulan Fray on: 09/10/2019 07:07 AM   Modules accepted: Orders

## 2019-09-11 ENCOUNTER — Ambulatory Visit (INDEPENDENT_AMBULATORY_CARE_PROVIDER_SITE_OTHER): Payer: Medicare Other | Admitting: Student

## 2019-09-11 ENCOUNTER — Other Ambulatory Visit: Payer: Self-pay

## 2019-09-11 ENCOUNTER — Encounter: Payer: Self-pay | Admitting: Student

## 2019-09-11 DIAGNOSIS — Z794 Long term (current) use of insulin: Secondary | ICD-10-CM

## 2019-09-11 DIAGNOSIS — E113593 Type 2 diabetes mellitus with proliferative diabetic retinopathy without macular edema, bilateral: Secondary | ICD-10-CM

## 2019-09-11 DIAGNOSIS — F329 Major depressive disorder, single episode, unspecified: Secondary | ICD-10-CM | POA: Diagnosis not present

## 2019-09-11 DIAGNOSIS — F32A Depression, unspecified: Secondary | ICD-10-CM

## 2019-09-11 LAB — GLUCOSE, CAPILLARY: Glucose-Capillary: 148 mg/dL — ABNORMAL HIGH (ref 70–99)
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
## 2019-09-11 NOTE — Patient Instructions (Addendum)
It was a pleasure seeing you in clinic. Today we discussed how you are doing on the Victoza   Please return in 2 months to recheck you A1c.  If you have any questions or concerns, please call our clinic at [phone] between 9am-5pm and after hours call [phone] and ask for the internal medicine resident on call. If you feel you are having a medical emergency please call 911.   Thank you, we look forward to helping you remain healthy!

## 2019-09-11 NOTE — Assessment & Plan Note (Signed)
Patient has not been taking citalopram and states her depression is doing better. Declined to fill out PHQ9 today. Denies suicidal thoughts. She states she has not been have depressive symptoms and does not want to be on medication for depression at this time.   Plan Reassess at next visit

## 2019-09-11 NOTE — Assessment & Plan Note (Addendum)
Recently started on Victoza on last visit. Had some initial nausea and vomiting while starting Victoza that have improved, states she has access to pens and needles. Daughter helps administer medication and has been rotating injections sites. Normal skin exam. Patient seems to be tolerating Victoza well, has no questions about the medication or administration at this time. Glucose monitoring showing glucose ranging from 102 to 216. Appears blood glucose is better. Will repeat hA1c at next visit and determine if she would benefit from switching to [facility].  -Repeat A1c in 2 months -Continue metformin 500 once daily, continue Victoza 1.2 mg daily -Patient will make appointment with podiatry

## 2019-09-13 NOTE — Progress Notes (Signed)
Internal Medicine Clinic Attending  I saw and evaluated the patient.  I personally confirmed the key portions of the history and exam documented by Dr. Liang and I reviewed pertinent patient test results.  The assessment, diagnosis, and plan were formulated together and I agree with the documentation in the resident's note.  

## 2019-09-16 ENCOUNTER — Encounter: Payer: Self-pay | Admitting: Podiatry

## 2019-09-16 ENCOUNTER — Ambulatory Visit (INDEPENDENT_AMBULATORY_CARE_PROVIDER_SITE_OTHER): Payer: Medicare Other | Admitting: Podiatry

## 2019-09-16 ENCOUNTER — Other Ambulatory Visit: Payer: Self-pay

## 2019-09-16 DIAGNOSIS — Q828 Other specified congenital malformations of skin: Secondary | ICD-10-CM

## 2019-09-16 DIAGNOSIS — M79675 Pain in left toe(s): Secondary | ICD-10-CM | POA: Diagnosis not present

## 2019-09-16 DIAGNOSIS — B351 Tinea unguium: Secondary | ICD-10-CM | POA: Diagnosis not present

## 2019-09-16 DIAGNOSIS — M79674 Pain in right toe(s): Secondary | ICD-10-CM

## 2019-09-16 DIAGNOSIS — E118 Type 2 diabetes mellitus with unspecified complications: Secondary | ICD-10-CM | POA: Diagnosis not present

## 2019-09-16 NOTE — Progress Notes (Addendum)
This patient returns to my office for at risk foot care.  This patient requires this care by a professional since this patient will be at risk due to having CVA chronic kidney disease and diabetes.  This patient has not been seen for about 10 months.  She presents to the office with her daughter-in-law.  This patient is unable to cut nails herself since the patient cannot reach her nails.These nails are painful walking and wearing shoes. She has a painful callus under her big toe joint left foot.    This patient presents for at risk foot care today.  General Appearance  Alert, conversant and in no acute stress.  Vascular  Dorsalis pedis and posterior tibial  pulses are weakly  palpable  bilaterally.  Capillary return is within normal limits  bilaterally. Temperature is within normal limits  bilaterally.  Neurologic  Senn-Weinstein monofilament wire test diminished  bilaterally. Muscle power within normal limits bilaterally.  Nails Thick disfigured discolored nails with subungual debris  from hallux to fifth toes bilaterally. No evidence of bacterial infection or drainage bilaterally.  Orthopedic  No limitations of motion  feet .  No crepitus or effusions noted.  No bony pathology or digital deformities noted.  DJD 1st MPJ  B/L.   Skin  normotropic skin with no porokeratosis noted bilaterally.  No signs of infections or ulcers noted.     Onychomycosis  Pain in right toes  Pain in left toes  Consent was obtained for treatment procedures.   Mechanical debridement of nails 1-5  bilaterally performed with a nail nipper.  Filed with dremel without incident.  Patient qualifies for diabetic shoes due to DPN and hallux limitus  1st MPJ  B/L.  Patient is to make an appointment with Liliane Channel for diabetic shoes at the same time she returns for nail care in 3 months.   Return office visit   3 months                   Told patient to return for periodic foot care and evaluation due to potential at risk  complications.   Gardiner Barefoot DPM .

## 2019-09-23 ENCOUNTER — Other Ambulatory Visit: Payer: Self-pay | Admitting: Internal Medicine

## 2019-09-23 DIAGNOSIS — F329 Major depressive disorder, single episode, unspecified: Secondary | ICD-10-CM

## 2019-09-28 NOTE — Telephone Encounter (Signed)
Error

## 2019-10-13 ENCOUNTER — Ambulatory Visit (INDEPENDENT_AMBULATORY_CARE_PROVIDER_SITE_OTHER): Payer: Medicare Other | Admitting: Dietician

## 2019-10-13 ENCOUNTER — Other Ambulatory Visit: Payer: Self-pay

## 2019-10-13 ENCOUNTER — Encounter: Payer: Self-pay | Admitting: Dietician

## 2019-10-13 DIAGNOSIS — E113593 Type 2 diabetes mellitus with proliferative diabetic retinopathy without macular edema, bilateral: Secondary | ICD-10-CM

## 2019-10-13 DIAGNOSIS — Z794 Long term (current) use of insulin: Secondary | ICD-10-CM | POA: Diagnosis not present

## 2019-10-13 NOTE — Progress Notes (Signed)
  Medical Nutrition Therapy:   Start time: 10:15 am  End time: 10:35 am Total time: 20 minutes Visit # 2  Assessment:  Primary concerns today: new to victoza and elevated A1C Ms. Skeen presents with her niece today for follow up for her diabetes. She brought her meter that shows well controled blood sugar done once daily. Her weight is decreased ,2#. She feels good and reports a good appetite.  Anticipate a1c closer to target at next check.   ANTHROPOMETRICS:  Estimated body mass index is 33.13 kg/m as calculated from the following:   Height as of 09/11/19: 5\' 4"  (1.626 m).   Weight as of this encounter: 193 lb (87.5 kg).  WEIGHT HISTORY: Wt Readings from Last 10 Encounters:  10/13/19 193 lb (87.5 kg)  09/11/19 194 lb 12.8 oz (88.4 kg)  08/04/19 205 lb 14.4 oz (93.4 kg)  02/24/19 225 lb 11.2 oz (102.4 kg)  12/02/18 222 lb 3.2 oz (100.8 kg)  11/06/18 218 lb 8 oz (99.1 kg)  11/05/18 220 lb (99.8 kg)  10/23/18 220 lb 12.8 oz (100.2 kg)  10/09/18 216 lb 6.4 oz (98.2 kg)  08/28/18 220 lb 4.8 oz (99.9 kg)    MEDICATIONS:  taking Victoza 1.2 mg daily; no side affects except lower blood sugars and not as hungry; she got pen needles  BLOOD SUGAR:she checks once daily between 1-2 pm- 29 values in 30 days, average si 137 with range of 98-182.  Lab Results  Component Value Date   HGBA1C 12.5 (A) 08/04/2019   HGBA1C 7.2 (A) 02/24/2019   HGBA1C 5.8 (A) 11/06/2018   HGBA1C 5.8 (A) 08/28/2018   HGBA1C 6.3 (A) 02/19/2018    Usual physical activity: limited to in the house due to her stroke  Progress Towards Goal(s):  In progress.   Nutritional Diagnosis:  [place]-2.4 Predicted food-medication interaction As related to decreased appetite after starting a GLP-1 RA to lower her blood sugar is resolved As evidenced by her report.    Intervention:  Nutrition education about her progress and maintaining a healthy diet despite decreased appetite. Action Goal: continue MVI with minerals, 3  balanced meals a day, checking blood sugar and taking victoza as ordered.  Outcome goal: improved blood sugars with only moderate 5%(~10# maximum) weight loss- continue to monitor.  Coordination of care:none  Teaching Method Utilized: Visual, Auditory,Hands on Handouts given during visit include:AVS Barriers to learning/adherence to lifestyle change: pandemic, depends on family for transporation Demonstrated degree of understanding via:  Teach Back   Monitoring/Evaluation:  Dietary intake, exercise,meter or blood sugar readings, and body weight in 3 month(s). Debera Lat, RD 10/13/2019 11:16 AM.

## 2019-10-13 NOTE — Patient Instructions (Addendum)
Chelsea [name],   Thank you for your visit today!  Your blood sugars are great!  Your weight is good too!  It is time to get a flu vaccine and covid-19 vaccine too.  You can et these together on the same day or on different days.[name].  Please follow up with your doctor for you A1C.  Call me if you need to! Follow up in 3-6 months  [name] [phone]

## 2019-10-23 DIAGNOSIS — D649 Anemia, unspecified: Secondary | ICD-10-CM | POA: Insufficient documentation

## 2019-10-23 DIAGNOSIS — D638 Anemia in other chronic diseases classified elsewhere: Secondary | ICD-10-CM | POA: Insufficient documentation

## 2019-10-23 NOTE — Assessment & Plan Note (Addendum)
Labs today show progression into CKD stage IV today. GFR 29. Her electrolytes are within range however will need to keep a very close eye on them given the progression of her renal disease and being on losartan-hctz and spironolactone Plan --f/u in 3 mo with repeat BMP. Will also obtain a urine protein/microalbumin at that time --in renal function remains within the CKD stage IV phase, will consider referral to nephrology

## 2019-10-23 NOTE — Assessment & Plan Note (Addendum)
Continue lipitor 80mg daily  

## 2019-10-23 NOTE — Assessment & Plan Note (Addendum)
Repeat CBC today shows stable normocytic anemia. Last iron panel consistent with anemia of CKD CBC Latest Ref Rng & Units 10/28/2019 10/25/2017 10/16/2017  WBC 4.0 - 10.5 K/uL 7.6 6.8 6.8  Hemoglobin 12.0 - 15.0 g/dL 11.9(L) 11.2(L) 11.2(L)  Hematocrit 36 - 46 % 39.0 36.1 35.2(L)  Platelets 150 - 400 K/uL 251 254 268   Plan: no change in management at this time.

## 2019-10-23 NOTE — Assessment & Plan Note (Signed)
Continue antiplatelet therapy with aspirin 81mg  daily.

## 2019-10-23 NOTE — Progress Notes (Signed)
Office Visit   [name], female    DOB: [birth year], 69 y.o.   MRN: [id number]  Subjective:  CC: T2DM, hypertension  69 y.o. presents today for follow up on T2DM.  Last seen by Dr. Lisabeth Devoid on 7/30 at which time CBGs were 102-216. plan was to continue 581m metformin daily and 1.29mVictoza daily and to repeat A1C 1m49mo DIABETES TYPE 2 FOLLOW-UP: Anti-hyperglycemic agents: victoza 1.1mg75mily, metformin 500mg37mly Secondary agents: lipitor 80mg 5m A1C: 6.4 A1C today:12.5 Have you taken medication(s) today: yes Med Adherence:  '[x]'  Yes    '[]'  No Medication side effects:  '[]'  Yes    '[x]'  No  Home Monitoring?  '[x]'  Yes    '[]'  No Home glucose results range: 110-130 Diet Adherence: '[x]'  Yes    '[x]'  No Exercise: '[]'  Yes    '[x]'  No Hypoglycemic episodes?: no Numbness of the feet? '[x]'  Yes    '[]'  No Retinopathy hx? '[x]'  Yes    '[]'  No Last eye exam: 10/13/19 Last foot exam negative within the past year.  HYPERTENSION FOLLOW-UP: Current medications:amlodipine 10mg d19m, losartan-hctz 100-12.5mg dai31m spironolactone 50mg dai75mave you taken blood pressure medication(s) today: yes Med Adherence: '[x]'  Yes    '[]'  No Medication side effects: '[]'  Yes    '[x]'  No Adherence with salt restriction: '[]'  Yes    '[x]'  No Exercise: Yes '[]'  No '[x]'  Home Monitoring?: '[]'  Yes    '[x]'  No Smoking '[]'  Yes '[x]'  No SOB? '[]'  Yes '[x]'  No Chest Pain?: '[]'  Yes    '[x]'  No Leg swelling?: '[x]'  Yes '[]'  No Headaches?: '[]'  Yes    '[x]'  No Dizziness? '[]'  Yes    '[x]'  No       ACTIVE MEDICATIONS   Current Outpatient Medications on File Prior to Visit  Medication Sig Dispense Refill  . Accu-Chek Softclix Lancets lancets Use to check blood sugar 2 times per day. 100 each 3  . albuterol (VENTOLIN HFA) 108 (90 Base) MCG/ACT inhaler INHALE 2 PUFFS into lungs EVERY 6 HOURS AS NEEDED FOR WHEEZING OR SHORTNESS OF BREATH 36 g 3  . amLODipine (NORVASC) 10 MG tablet Take 1 tablet (10 mg total) by mouth daily. 90 tablet 3  . aspirin  81 MG EC tablet Take 1 tablet (81 mg total) by mouth daily. 30 tablet 11  . Blood Glucose Monitoring Suppl (ACCU-CHEK GUIDE ME) w/Device KIT Use to check blood sugar one time per day. 1 kit 0  . citalopram (CELEXA) 20 MG tablet TAKE 1 TABLET BY MOUTH AT BEDTIME 90 tablet 1  . glucose blood (ACCU-CHEK GUIDE) test strip Use to check blood sugar two times per day. 100 each 3  . Incontinence Supply Disposable (BLADDER CONTROL PAD REGULAR) MISC Use daily as directed 100 each 11  . Insulin Pen Needle (PEN NEEDLES) 32G X 4 MM MISC Use once daily to inject victoza 100 each 3  . Lancets Misc. (ACCU-CHEK FASTCLIX LANCET) KIT Use to check blood sugar twice a day as discussed. 1 kit 1  . [DISCONTINUED] fluticasone (FLONASE) 50 MCG/ACT nasal spray PLACE 2 SPRAYS IN EACH NOSTRIL DAILY 16 g 2  . [DISCONTINUED] Lancet Device MISC 1 each by Does not apply route 3 (three) times daily. 1 each 11   No current facility-administered medications on file prior to visit.    ROS  Review of Systems  Constitutional: Negative.   Respiratory: Negative.   Cardiovascular: Negative.   Neurological: Negative.     Objective:  BP (!) 148/59 (BP Location: Left Arm, Patient Position: Sitting, Cuff Size: Normal)   Pulse 86   Temp 98.5 F (36.9 C) (Oral)   Wt 190 lb 3.2 oz (86.3 kg)   SpO2 99%   BMI 32.65 kg/m  Wt Readings from Last 3 Encounters:  10/28/19 190 lb 3.2 oz (86.3 kg)  10/13/19 193 lb (87.5 kg)  09/11/19 194 lb 12.8 oz (88.4 kg)   BP Readings from Last 3 Encounters:  10/28/19 (!) 148/59  09/11/19 (!) 132/79  08/04/19 (!) 168/66   Physical Exam Constitutional:      Appearance: Normal appearance. She is obese.  Cardiovascular:     Rate and Rhythm: Normal rate and regular rhythm.  Pulmonary:     Effort: Pulmonary effort is normal.     Breath sounds: Normal breath sounds.     Health Maintenance:   Health Maintenance  Topic Date Due  . Hepatitis C Screening  Never done  . COVID-19 Vaccine  (1) Never done  . COLONOSCOPY  03/23/2018  . MAMMOGRAM  11/21/2018  . INFLUENZA VACCINE  Never done  . TETANUS/TDAP  09/27/2019  . PNA vac Low Risk Adult (1 of 2 - PCV13) 12/02/2019 (Originally 12/16/2013)  . HEMOGLOBIN A1C  01/27/2020  . FOOT EXAM  09/15/2020  . LIPID PANEL  10/27/2020  . OPHTHALMOLOGY EXAM  10/27/2020  . DEXA SCAN  Completed     Assessment & Plan:   Problem List Items Addressed This Visit      Cardiovascular and Mediastinum   Hypertension associated with diabetes ([place]) (Chronic)    Blood pressure is elevated in the office today. This has been an ongoing issue.  BP Readings from Last 3 Encounters:  10/28/19 (!) 148/59  09/11/19 (!) 132/79  08/04/19 (!) 168/66    We discussed the importance of better blood pressure control however patient remains hesitant to increasing her antihypertensive regimen as she has developed dizziness/lightheadedness from this in the past. BMP checked today--electrolytes wnl.  Plan --f/u in 3 mo. Patient is agreeable to increasing her antihypertensives if she remains elevated at that visit       Relevant Medications   atorvastatin (LIPITOR) 80 MG tablet   liraglutide (VICTOZA) 18 MG/3ML SOPN   losartan-hydrochlorothiazide (HYZAAR) 100-12.5 MG tablet   metFORMIN (GLUCOPHAGE-XR) 500 MG 24 hr tablet   spironolactone (ALDACTONE) 50 MG tablet   Other Relevant Orders   CBC no Diff (Completed)   BMP w Anion Gap (STAT/Sunquest-performed on-site) (Completed)   Stroke (HCC)    Continue antiplatelet therapy with aspirin 22m daily.      Relevant Medications   atorvastatin (LIPITOR) 80 MG tablet   losartan-hydrochlorothiazide (HYZAAR) 100-12.5 MG tablet   spironolactone (ALDACTONE) 50 MG tablet     Endocrine   Type 2 diabetes mellitus with diabetic retinopathy (HCC) - Primary (Chronic)    A1C significantly improved from last visit--6.4 from 12.5. Foot exam UTD. Eye exam discussed however pt continues to put this  off. Plan --continue metformin 5052mdaily, victoza --f/u in 3 mo with repeat A1C      Relevant Medications   atorvastatin (LIPITOR) 80 MG tablet   liraglutide (VICTOZA) 18 MG/3ML SOPN   losartan-hydrochlorothiazide (HYZAAR) 100-12.5 MG tablet   metFORMIN (GLUCOPHAGE-XR) 500 MG 24 hr tablet   spironolactone (ALDACTONE) 50 MG tablet   Other Relevant Orders   POC Hbg A1C (Completed)   Hyperlipidemia associated with type 2 diabetes mellitus (HCC) (Chronic)    Continue lipitor 8041maily.  Relevant Medications   atorvastatin (LIPITOR) 80 MG tablet   liraglutide (VICTOZA) 18 MG/3ML SOPN   losartan-hydrochlorothiazide (HYZAAR) 100-12.5 MG tablet   metFORMIN (GLUCOPHAGE-XR) 500 MG 24 hr tablet   Other Relevant Orders   Lipid panel (Completed)     Genitourinary   CKD (chronic kidney disease) stage 4, GFR 15-29 ml/min (HCC)    Labs today show progression into CKD stage IV today. GFR 29. Her electrolytes are within range however will need to keep a very close eye on them given the progression of her renal disease and being on losartan-hctz and spironolactone Plan --f/u in 3 mo with repeat BMP. Will also obtain a urine protein/microalbumin at that time --in renal function remains within the CKD stage IV phase, will consider referral to nephrology        Other   Healthcare maintenance    Patient continues to decline health screening or vaccination. Discussed the important role these play in preventative and early detection. Will continue to encourage       Depression    Symptoms well controlled on celexa 20m daily. Continue current management.      Relevant Medications   traZODone (DESYREL) 50 MG tablet   Anemia    Repeat CBC today shows stable normocytic anemia. Last iron panel consistent with anemia of CKD CBC Latest Ref Rng & Units 10/28/2019 10/25/2017 10/16/2017  WBC 4.0 - 10.5 K/uL 7.6 6.8 6.8  Hemoglobin 12.0 - 15.0 g/dL 11.9(L) 11.2(L) 11.2(L)  Hematocrit 36 - 46 %  39.0 36.1 35.2(L)  Platelets 150 - 400 K/uL 251 254 268   Plan: no change in management at this time.       Other Visit Diagnoses    Insomnia, unspecified type       Relevant Medications   traZODone (DESYREL) 50 MG tablet        Pt discussed with Dr. RClista Bernhardt MD Internal Medicine Resident PGY-2 [facility] Pager: #[phone]/16/2021 6:58 AM

## 2019-10-28 ENCOUNTER — Encounter: Payer: Self-pay | Admitting: Internal Medicine

## 2019-10-28 ENCOUNTER — Ambulatory Visit (INDEPENDENT_AMBULATORY_CARE_PROVIDER_SITE_OTHER): Payer: Medicare Other | Admitting: Internal Medicine

## 2019-10-28 ENCOUNTER — Other Ambulatory Visit: Payer: Self-pay

## 2019-10-28 VITALS — BP 148/59 | HR 86 | Temp 98.5°F | Wt 190.2 lb

## 2019-10-28 DIAGNOSIS — E1159 Type 2 diabetes mellitus with other circulatory complications: Secondary | ICD-10-CM

## 2019-10-28 DIAGNOSIS — N1832 Chronic kidney disease, stage 3b: Secondary | ICD-10-CM | POA: Diagnosis not present

## 2019-10-28 DIAGNOSIS — E1169 Type 2 diabetes mellitus with other specified complication: Secondary | ICD-10-CM

## 2019-10-28 DIAGNOSIS — E785 Hyperlipidemia, unspecified: Secondary | ICD-10-CM | POA: Diagnosis not present

## 2019-10-28 DIAGNOSIS — D649 Anemia, unspecified: Secondary | ICD-10-CM | POA: Diagnosis not present

## 2019-10-28 DIAGNOSIS — Z794 Long term (current) use of insulin: Secondary | ICD-10-CM

## 2019-10-28 DIAGNOSIS — F329 Major depressive disorder, single episode, unspecified: Secondary | ICD-10-CM

## 2019-10-28 DIAGNOSIS — I6340 Cerebral infarction due to embolism of unspecified cerebral artery: Secondary | ICD-10-CM

## 2019-10-28 DIAGNOSIS — E113593 Type 2 diabetes mellitus with proliferative diabetic retinopathy without macular edema, bilateral: Secondary | ICD-10-CM

## 2019-10-28 DIAGNOSIS — G4700 Insomnia, unspecified: Secondary | ICD-10-CM | POA: Diagnosis not present

## 2019-10-28 DIAGNOSIS — Z0001 Encounter for general adult medical examination with abnormal findings: Secondary | ICD-10-CM | POA: Diagnosis not present

## 2019-10-28 DIAGNOSIS — E1122 Type 2 diabetes mellitus with diabetic chronic kidney disease: Secondary | ICD-10-CM

## 2019-10-28 DIAGNOSIS — I129 Hypertensive chronic kidney disease with stage 1 through stage 4 chronic kidney disease, or unspecified chronic kidney disease: Secondary | ICD-10-CM

## 2019-10-28 DIAGNOSIS — I152 Hypertension secondary to endocrine disorders: Secondary | ICD-10-CM

## 2019-10-28 DIAGNOSIS — F32A Depression, unspecified: Secondary | ICD-10-CM

## 2019-10-28 DIAGNOSIS — Z0000 Encounter for general adult medical examination without abnormal findings: Secondary | ICD-10-CM

## 2019-10-28 LAB — LIPID PANEL
Cholesterol: 116 mg/dL (ref 0–200)
HDL: 46 mg/dL (ref >40)
LDL Cholesterol: 55 mg/dL (ref 0–99)
Total CHOL/HDL Ratio: 2.5 RATIO
Triglycerides: 73 mg/dL (ref <150)
VLDL: 15 mg/dL (ref 0–40)

## 2019-10-28 LAB — BASIC METABOLIC PANEL
Anion gap: 12 (ref 5–15)
BUN: 22 mg/dL (ref 8–23)
CO2: 21 mmol/L — ABNORMAL LOW (ref 22–32)
Calcium: 9.2 mg/dL (ref 8.9–10.3)
Chloride: 105 mmol/L (ref 98–111)
Creatinine, Ser: 1.76 mg/dL — ABNORMAL HIGH (ref 0.44–1.00)
GFR calc Af Amer: 33 mL/min — ABNORMAL LOW (ref >60)
GFR calc non Af Amer: 29 mL/min — ABNORMAL LOW (ref >60)
Glucose, Bld: 123 mg/dL — ABNORMAL HIGH (ref 70–99)
Potassium: 4.4 mmol/L (ref 3.5–5.1)
Sodium: 138 mmol/L (ref 135–145)

## 2019-10-28 LAB — POCT GLYCOSYLATED HEMOGLOBIN (HGB A1C): Hemoglobin A1C: 6.4 % — AB (ref 4.0–5.6)

## 2019-10-28 LAB — CBC
HCT: 39 % (ref 36.0–46.0)
Hemoglobin: 11.9 g/dL — ABNORMAL LOW (ref 12.0–15.0)
MCH: 28.2 pg (ref 26.0–34.0)
MCHC: 30.5 g/dL (ref 30.0–36.0)
MCV: 92.4 fL (ref 80.0–100.0)
Platelets: 251 10*3/uL (ref 150–400)
RBC: 4.22 MIL/uL (ref 3.87–5.11)
RDW: 13.2 % (ref 11.5–15.5)
WBC: 7.6 10*3/uL (ref 4.0–10.5)
nRBC: 0 % (ref 0.0–0.2)

## 2019-10-28 LAB — GLUCOSE, CAPILLARY: Glucose-Capillary: 136 mg/dL — ABNORMAL HIGH (ref 70–99)

## 2019-10-28 NOTE — Patient Instructions (Addendum)
It was great to see you again today!  Your A1C is down to 6.4 today.  It was 12.5 in June. Keep doing what you are doing! I am going to check some labs for you today. I will let you know the results when they come back. Your blood pressure is a little high today. High blood pressure over long periods of time increases your risk for things like strokes and heart attacks. One of the things that can help your blood pressure is trying to minimize the sodium in your diet. If your blood pressure remains high at the next visit, I do think we should increase one of your medications.  Please come back in 3 months to see me!

## 2019-10-29 ENCOUNTER — Encounter: Payer: Self-pay | Admitting: Internal Medicine

## 2019-10-29 MED ORDER — METFORMIN HCL ER 500 MG PO TB24: ORAL_TABLET | ORAL | 5 refills | Status: DC

## 2019-10-29 MED ORDER — TRAZODONE HCL 50 MG PO TABS: 25.0000 mg | ORAL_TABLET | Freq: Every day | ORAL | 5 refills | Status: DC

## 2019-10-29 MED ORDER — VICTOZA 18 MG/3ML ~~LOC~~ SOPN: 1.2000 mg | PEN_INJECTOR | Freq: Every day | SUBCUTANEOUS | 2 refills | Status: DC

## 2019-10-29 MED ORDER — SPIRONOLACTONE 50 MG PO TABS: 50.0000 mg | ORAL_TABLET | Freq: Every day | ORAL | 1 refills | Status: DC

## 2019-10-29 MED ORDER — LOSARTAN POTASSIUM-HCTZ 100-12.5 MG PO TABS: 1.0000 | ORAL_TABLET | Freq: Every day | ORAL | 5 refills | Status: DC

## 2019-10-29 MED ORDER — ATORVASTATIN CALCIUM 80 MG PO TABS: 80.0000 mg | ORAL_TABLET | Freq: Every day | ORAL | 3 refills | Status: DC

## 2019-10-29 NOTE — Assessment & Plan Note (Signed)
Symptoms well controlled on celexa 20mg  daily. Continue current management.

## 2019-10-29 NOTE — Assessment & Plan Note (Addendum)
Blood pressure is elevated in the office today. This has been an ongoing issue.  BP Readings from Last 3 Encounters:  10/28/19 (!) 148/59  09/11/19 (!) 132/79  08/04/19 (!) 168/66    We discussed the importance of better blood pressure control however patient remains hesitant to increasing her antihypertensive regimen as she has developed dizziness/lightheadedness from this in the past. BMP checked today--electrolytes wnl.  Plan --f/u in 3 mo. Patient is agreeable to increasing her antihypertensives if she remains elevated at that visit

## 2019-10-29 NOTE — Assessment & Plan Note (Signed)
Patient continues to decline health screening or vaccination. Discussed the important role these play in preventative and early detection. Will continue to encourage

## 2019-10-29 NOTE — Assessment & Plan Note (Signed)
A1C significantly improved from last visit--6.4 from 12.5. Foot exam UTD. Eye exam discussed however pt continues to put this off. Plan --continue metformin 500mg  daily, victoza --f/u in 3 mo with repeat A1C

## 2019-11-04 NOTE — Progress Notes (Signed)
Internal Medicine Clinic Attending  Case discussed with Dr. Christian at the time of the visit.  We reviewed the resident's history and exam and pertinent patient test results.  I agree with the assessment, diagnosis, and plan of care documented in the resident's note.  Hanan Mcwilliams, M.D., Ph.D.  

## 2019-11-05 ENCOUNTER — Other Ambulatory Visit: Payer: Self-pay | Admitting: Dietician

## 2019-11-05 DIAGNOSIS — E113593 Type 2 diabetes mellitus with proliferative diabetic retinopathy without macular edema, bilateral: Secondary | ICD-10-CM

## 2019-11-05 DIAGNOSIS — Z794 Long term (current) use of insulin: Secondary | ICD-10-CM

## 2019-11-05 MED ORDER — ACCU-CHEK GUIDE VI STRP: ORAL_STRIP | 3 refills | Status: DC

## 2019-11-05 MED ORDER — ACCU-CHEK SOFTCLIX LANCETS MISC: 3 refills | Status: DC

## 2019-11-05 NOTE — Telephone Encounter (Signed)
Requested testing supplies be refilled at [place].

## 2019-11-06 ENCOUNTER — Telehealth: Payer: Self-pay | Admitting: *Deleted

## 2019-11-06 ENCOUNTER — Other Ambulatory Visit: Payer: Self-pay | Admitting: Internal Medicine

## 2019-11-06 DIAGNOSIS — Z794 Long term (current) use of insulin: Secondary | ICD-10-CM

## 2019-11-06 DIAGNOSIS — E113593 Type 2 diabetes mellitus with proliferative diabetic retinopathy without macular edema, bilateral: Secondary | ICD-10-CM

## 2019-11-06 MED ORDER — ACCU-CHEK SOFTCLIX LANCETS MISC: 3 refills | Status: DC

## 2019-11-06 MED ORDER — ACCU-CHEK GUIDE VI STRP: ORAL_STRIP | 3 refills | Status: DC

## 2019-11-06 NOTE — Telephone Encounter (Signed)
RTC to patient, no answer, no VM was set up so RN unable to leave message. SChaplin, RN,BSN

## 2019-11-06 NOTE — Telephone Encounter (Signed)
Call from [facility]-they are unable to bill pt's insurance for DM testing supplies and requests new rx be sent to [facility] (per pt's request).  CMA will resend rx for test strips and lancets to [facility] aware.Regenia Skeeter, Jayana Kotula Cassady9/24/20219:40 AM

## 2019-11-06 NOTE — Telephone Encounter (Signed)
Pls contact pt [phone]

## 2019-11-10 MED ORDER — PEN NEEDLES 32G X 4 MM MISC: 3 refills | Status: DC

## 2019-11-10 NOTE — Telephone Encounter (Signed)
Chelsea [name] asks that her pen needle prescription be sent to [place].

## 2019-11-11 ENCOUNTER — Other Ambulatory Visit: Payer: Self-pay | Admitting: Internal Medicine

## 2019-12-05 ENCOUNTER — Other Ambulatory Visit: Payer: Self-pay | Admitting: Internal Medicine

## 2019-12-05 DIAGNOSIS — E113593 Type 2 diabetes mellitus with proliferative diabetic retinopathy without macular edema, bilateral: Secondary | ICD-10-CM

## 2019-12-05 DIAGNOSIS — G4700 Insomnia, unspecified: Secondary | ICD-10-CM

## 2019-12-18 ENCOUNTER — Other Ambulatory Visit: Payer: Self-pay

## 2019-12-18 ENCOUNTER — Ambulatory Visit (INDEPENDENT_AMBULATORY_CARE_PROVIDER_SITE_OTHER): Payer: Medicare Other | Admitting: Podiatry

## 2019-12-18 ENCOUNTER — Encounter: Payer: Self-pay | Admitting: Podiatry

## 2019-12-18 DIAGNOSIS — M79675 Pain in left toe(s): Secondary | ICD-10-CM

## 2019-12-18 DIAGNOSIS — B351 Tinea unguium: Secondary | ICD-10-CM | POA: Diagnosis not present

## 2019-12-18 DIAGNOSIS — M79674 Pain in right toe(s): Secondary | ICD-10-CM | POA: Diagnosis not present

## 2019-12-18 DIAGNOSIS — E118 Type 2 diabetes mellitus with unspecified complications: Secondary | ICD-10-CM | POA: Diagnosis not present

## 2019-12-18 NOTE — Progress Notes (Signed)
This patient returns to my office for at risk foot care.  This patient requires this care by a professional since this patient will be at risk due to having CVA chronic kidney disease and diabetes.  This patient is unable to cut nails herself since the patient cannot reach her nails.These nails are painful walking and wearing shoes.   This patient presents for at risk foot care today.  General Appearance  Alert, conversant and in no acute stress.  Vascular  Dorsalis pedis and posterior tibial  pulses are weakly  palpable  bilaterally.  Capillary return is within normal limits  bilaterally. Temperature is within normal limits  bilaterally.  Neurologic  Senn-Weinstein monofilament wire test diminished  bilaterally. Muscle power within normal limits bilaterally.  Nails Thick disfigured discolored nails with subungual debris  from hallux to fifth toes bilaterally. No evidence of bacterial infection or drainage bilaterally.  Orthopedic  No limitations of motion  feet .  No crepitus or effusions noted.  No bony pathology or digital deformities noted.  DJD 1st MPJ  B/L.   Skin  normotropic skin with no porokeratosis noted bilaterally.  No signs of infections or ulcers noted.     Onychomycosis  Pain in right toes  Pain in left toes  Consent was obtained for treatment procedures.   Mechanical debridement of nails 1-5  bilaterally performed with a nail nipper.  Filed with dremel without incident.  Patient qualifies for diabetic shoes due to DPN and hallux limitus  1st MPJ  B/L.  Patient is to make an appointment with Liliane Channel for diabetic shoes at the same time she returns for nail care in 3 months.   Return office visit   3 months                   Told patient to return for periodic foot care and evaluation due to potential at risk complications.   Gardiner Barefoot DPM .

## 2020-01-29 ENCOUNTER — Encounter: Payer: Self-pay | Admitting: Internal Medicine

## 2020-01-29 ENCOUNTER — Ambulatory Visit (INDEPENDENT_AMBULATORY_CARE_PROVIDER_SITE_OTHER): Payer: Medicare Other | Admitting: Internal Medicine

## 2020-01-29 VITALS — BP 141/69 | HR 100 | Temp 98.4°F | Wt 186.9 lb

## 2020-01-29 DIAGNOSIS — N1832 Chronic kidney disease, stage 3b: Secondary | ICD-10-CM

## 2020-01-29 DIAGNOSIS — Z794 Long term (current) use of insulin: Secondary | ICD-10-CM

## 2020-01-29 DIAGNOSIS — Z0000 Encounter for general adult medical examination without abnormal findings: Secondary | ICD-10-CM

## 2020-01-29 DIAGNOSIS — Z8673 Personal history of transient ischemic attack (TIA), and cerebral infarction without residual deficits: Secondary | ICD-10-CM | POA: Diagnosis not present

## 2020-01-29 DIAGNOSIS — I152 Hypertension secondary to endocrine disorders: Secondary | ICD-10-CM

## 2020-01-29 DIAGNOSIS — F329 Major depressive disorder, single episode, unspecified: Secondary | ICD-10-CM | POA: Diagnosis not present

## 2020-01-29 DIAGNOSIS — E1159 Type 2 diabetes mellitus with other circulatory complications: Secondary | ICD-10-CM | POA: Diagnosis not present

## 2020-01-29 DIAGNOSIS — E113593 Type 2 diabetes mellitus with proliferative diabetic retinopathy without macular edema, bilateral: Secondary | ICD-10-CM | POA: Diagnosis not present

## 2020-01-29 DIAGNOSIS — N184 Chronic kidney disease, stage 4 (severe): Secondary | ICD-10-CM

## 2020-01-29 DIAGNOSIS — G4700 Insomnia, unspecified: Secondary | ICD-10-CM

## 2020-01-29 LAB — POCT GLYCOSYLATED HEMOGLOBIN (HGB A1C): Hemoglobin A1C: 6.2 % — AB (ref 4.0–5.6)

## 2020-01-29 LAB — GLUCOSE, CAPILLARY: Glucose-Capillary: 116 mg/dL — ABNORMAL HIGH (ref 70–99)

## 2020-01-29 MED ORDER — CITALOPRAM HYDROBROMIDE 20 MG PO TABS: 20.0000 mg | ORAL_TABLET | Freq: Every day | ORAL | 1 refills | Status: DC

## 2020-01-29 MED ORDER — TRAZODONE HCL 50 MG PO TABS: 25.0000 mg | ORAL_TABLET | Freq: Every day | ORAL | 0 refills | Status: DC

## 2020-01-29 MED ORDER — LOSARTAN POTASSIUM-HCTZ 100-12.5 MG PO TABS: 1.0000 | ORAL_TABLET | Freq: Every day | ORAL | 0 refills | Status: DC

## 2020-01-29 MED ORDER — METFORMIN HCL ER 500 MG PO TB24: ORAL_TABLET | ORAL | 0 refills | Status: DC

## 2020-01-29 MED ORDER — SPIRONOLACTONE 50 MG PO TABS: 50.0000 mg | ORAL_TABLET | Freq: Every day | ORAL | 1 refills | Status: DC

## 2020-01-29 MED ORDER — VICTOZA 18 MG/3ML ~~LOC~~ SOPN: 1.2000 mg | PEN_INJECTOR | Freq: Every day | SUBCUTANEOUS | 2 refills | Status: DC

## 2020-01-29 NOTE — Assessment & Plan Note (Addendum)
Renal function has been declining based on prior labs.  Appears euvolemic on exam today. Will obtain a BMP today. She is on a number of medications that are renally filtered and will need to consider their appropriateness to be continued based on today's results. These include losartan, spironolactone, and metformin.  Addendum: BMP shows improvement in her GFR from 29 to 36 so no medication adjustment are needed today. Will continue to monitor renal function closely and consider nephrology referral in the near future. 01/30/20 10:36 PM

## 2020-01-29 NOTE — Progress Notes (Signed)
Internal Medicine Clinic Attending  Case discussed with Dr. Christian at the time of the visit.  We reviewed the resident's history and exam and pertinent patient test results.  I agree with the assessment, diagnosis, and plan of care documented in the resident's note.  Elice Crigger, M.D., Ph.D.  

## 2020-01-29 NOTE — Patient Instructions (Signed)
Great job on your A1C and weight loss! I can tell you are working hard on this and I'm proud of that.  I am concerned about your kidney function. The lab I checked today will give [place] an idea of how they are working. I will call you with the results of that.

## 2020-01-29 NOTE — Assessment & Plan Note (Addendum)
Current medications: victoza 1.2mg  daily, metformin 500mg  daily Denies hypoglycemic events Last A1C 6.4 in September 2021 A1C today is 6.2. note that this may be falsely lowered with advanced renal disease and anemia Foot exam performed 09/2019 Retinal screening is due Plan -continue current management for now. Will need to consider whether or not to continue metformin depending on where her renal function is. -reiterated the importance of retinal screening -f/u in 72mo

## 2020-01-29 NOTE — Progress Notes (Signed)
Office Visit   [name], female    DOB: 1950-09-28, 69 y.o.   MRN: [id number]  Subjective:  CC: hypertension, diabetes follow up  HPI 69 y.o. presents today for follow up of her chronic medical conditions. Refer to problem based charting for details, assessment and plan.      ACTIVE MEDICATIONS   Outpatient Medications Prior to Visit  Medication Sig Dispense Refill  . Accu-Chek Softclix Lancets lancets Use to check blood sugar 2 times per day. 100 each 3  . albuterol (VENTOLIN HFA) 108 (90 Base) MCG/ACT inhaler INHALE 2 PUFFS into lungs EVERY 6 HOURS AS NEEDED FOR WHEEZING OR SHORTNESS OF BREATH 36 g 3  . amLODipine (NORVASC) 10 MG tablet Take 1 tablet (10 mg total) by mouth daily. 90 tablet 3  . aspirin 81 MG EC tablet Take 1 tablet (81 mg total) by mouth daily. 30 tablet 11  . atorvastatin (LIPITOR) 80 MG tablet Take 1 tablet (80 mg total) by mouth daily. 90 tablet 3  . Blood Glucose Monitoring Suppl (ACCU-CHEK GUIDE ME) w/Device KIT Use to check blood sugar one time per day. 1 kit 0  . fluticasone (FLONASE) 50 MCG/ACT nasal spray USE 2 SPRAYS IN EACH NOSTRIL DAILY 32 g 1  . glucose blood (ACCU-CHEK GUIDE) test strip Use to check blood sugar two times per day. 100 each 3  . Incontinence Supply Disposable (BLADDER CONTROL PAD REGULAR) MISC Use daily as directed 100 each 11  . Insulin Pen Needle (PEN NEEDLES) 32G X 4 MM MISC Use once daily to inject victoza 100 each 3  . Lancets Misc. (ACCU-CHEK FASTCLIX LANCET) KIT Use to check blood sugar twice a day as discussed. 1 kit 1  . citalopram (CELEXA) 20 MG tablet TAKE 1 TABLET BY MOUTH AT BEDTIME 90 tablet 1  . liraglutide (VICTOZA) 18 MG/3ML SOPN Inject 1.2 mg into the skin daily. For the first 1 week, inject only 0.1 mLs (0.6 mg total) into the skin daily. Then progress to 0.2 mLs 15 mL 2  . losartan-hydrochlorothiazide (HYZAAR) 100-12.5 MG tablet Take 1 tablet by mouth daily. 30 tablet 5  . metFORMIN (GLUCOPHAGE-XR)  500 MG 24 hr tablet Take one tablet by mouth daily with breakfast. 30 tablet 5  . spironolactone (ALDACTONE) 50 MG tablet Take 1 tablet (50 mg total) by mouth daily. 90 tablet 1  . traZODone (DESYREL) 50 MG tablet Take 0.5-1 tablets (25-50 mg total) by mouth at bedtime. 30 tablet 5   No facility-administered medications prior to visit.     ROS  Review of Systems  Constitutional: Negative for appetite change, fatigue and unexpected weight change.  Respiratory: Negative for shortness of breath.   Cardiovascular: Negative for chest pain and leg swelling.  Neurological: Negative for dizziness and light-headedness.    Objective:   BP (!) 141/69 (BP Location: Right Arm, Patient Position: Sitting, Cuff Size: Normal)   Pulse 100   Temp 98.4 F (36.9 C) (Oral)   Wt 186 lb 14.4 oz (84.8 kg)   SpO2 99%   BMI 32.08 kg/m  Wt Readings from Last 3 Encounters:  01/29/20 186 lb 14.4 oz (84.8 kg)  10/28/19 190 lb 3.2 oz (86.3 kg)  10/13/19 193 lb (87.5 kg)   BP Readings from Last 3 Encounters:  01/29/20 (!) 141/69  10/28/19 (!) 148/59  09/11/19 (!) 132/79   Physical Exam Constitutional:      Appearance: Normal appearance.  Cardiovascular:     Rate and Rhythm:  Normal rate and regular rhythm.  Pulmonary:     Effort: Pulmonary effort is normal.     Breath sounds: Normal breath sounds.  Musculoskeletal:     Right lower leg: No edema.     Left lower leg: No edema.  Skin:    General: Skin is warm and dry.  Neurological:     Mental Status: She is oriented to person, place, and time.  Psychiatric:        Mood and Affect: Mood normal.     Health Maintenance:   Health Maintenance  Topic Date Due  . Hepatitis C Screening  Never done  . COLONOSCOPY  03/23/2018  . OPHTHALMOLOGY EXAM  06/26/2018  . MAMMOGRAM  11/21/2018  . COVID-19 Vaccine (1) 02/14/2020 (Originally 12/16/1962)  . INFLUENZA VACCINE  05/12/2020 (Originally 09/13/2019)  . TETANUS/TDAP  01/28/2021 (Originally 09/27/2019)   . PNA vac Low Risk Adult (1 of 2 - PCV13) 01/28/2021 (Originally 12/16/2013)  . HEMOGLOBIN A1C  04/28/2020  . FOOT EXAM  09/15/2020  . LIPID PANEL  10/27/2020  . DEXA SCAN  Completed     Assessment & Plan:   Problem List Items Addressed This Visit      Cardiovascular and Mediastinum   Hypertension associated with diabetes ([place]) (Chronic)    Current medications: amlodipine 58m, losartan-hctz 100-12.544m spironolactone 5037maily Denies adverse medication effects. Blood pressure is above goal in the office today, consistent with prior visits. BASIC VITALS 01/29/2020 10/28/2019 10/28/2019 10/13/2019 09/11/2019  BP 141/69 148/59 148/72  132/79   She has made some dietary adjustments since June and has lost 19 pounds since June. I congratulated her on this achievement. BASIC VITALS 01/29/2020 10/28/2019 10/13/2019  Weight 186 lbs 14 oz 190 lbs 3 oz 193 lbs   BASIC VITALS 09/11/2019 08/04/2019  Weight 194 lbs 13 oz 205 lbs 14 oz   Assessment: given her history of stroke, I would like to see her blood pressure slightly better controlled. Unfortunately, her renal function has also been declining which makes this slightly more challenging. Prior to making any medication adjustments, we will check her renal function and electrolyte status.  Plan -BMP today -will call her once week for further recommendations pending the lab results. I would consider increasing hctz to 59m56m    Relevant Medications   liraglutide (VICTOZA) 18 MG/3ML SOPN   losartan-hydrochlorothiazide (HYZAAR) 100-12.5 MG tablet   metFORMIN (GLUCOPHAGE-XR) 500 MG 24 hr tablet   spironolactone (ALDACTONE) 50 MG tablet   Other Relevant Orders   Basic metabolic panel     Endocrine   Type 2 diabetes mellitus with diabetic retinopathy (HCC)[place] (Chronic)    Current medications: victoza 1.2mg 37mly, metformin 500mg 75my Denies hypoglycemic events Last A1C 6.4 in September 2021 A1C today is 6.2. note that this may be  falsely lowered with advanced renal disease and anemia Foot exam performed 09/2019 Retinal screening is due Plan -continue current management for now. Will need to consider whether or not to continue metformin depending on where her renal function is. -reiterated the importance of retinal screening -f/u in 41mo   65moelevant Medications   liraglutide (VICTOZA) 18 MG/3ML SOPN   losartan-hydrochlorothiazide (HYZAAR) 100-12.5 MG tablet   metFORMIN (GLUCOPHAGE-XR) 500 MG 24 hr tablet   spironolactone (ALDACTONE) 50 MG tablet   Other Relevant Orders   POC Hbg A1C (Completed)     Genitourinary   CKD (chronic kidney disease) stage 4, GFR 15-29 ml/min (HCC)    Renal  function has been declining based on prior labs.  Will obtain a BMP today. She is on a number of medications that are renally filtered and will need to consider their appropriateness to be continued based on today's results. These include losartan, spironolactone, and metformin.        Other   Healthcare maintenance    I revisited the discussion of colon and breast cancer screening. She continues to decline at this time.  I also revisited the discussion of vaccination against COVID 19 infection. She continues to decline at this time. When I tried to inquire into her reasoning, she says she just doesn't want to talk about it.      Depression   Relevant Medications   citalopram (CELEXA) 20 MG tablet   traZODone (DESYREL) 50 MG tablet    Other Visit Diagnoses    Insomnia, unspecified type       Relevant Medications   traZODone (DESYREL) 50 MG tablet        Pt discussed with Dr. Clista Bernhardt, MD Internal Medicine Resident PGY-2 [facility] Pager: [phone] 01/29/2020 3:18 PM

## 2020-01-29 NOTE — Assessment & Plan Note (Addendum)
Current medications: amlodipine 10mg , losartan-hctz 100-12.5mg , spironolactone 50mg  daily Denies adverse medication effects. Blood pressure is above goal in the office today, consistent with prior visits. BASIC VITALS 01/29/2020 10/28/2019 10/28/2019 10/13/2019 09/11/2019  BP 141/69 148/59 148/72  132/79   She has made some dietary adjustments since June and has lost 19 pounds since June. I congratulated her on this achievement. BASIC VITALS 01/29/2020 10/28/2019 10/13/2019  Weight 186 lbs 14 oz 190 lbs 3 oz 193 lbs   BASIC VITALS 09/11/2019 08/04/2019  Weight 194 lbs 13 oz 205 lbs 14 oz   Assessment: given her history of stroke, I would like to see her blood pressure slightly better controlled. Unfortunately, her renal function has also been declining which makes this slightly more challenging. Prior to making any medication adjustments, we will check her renal function and electrolyte status.  Plan -BMP today -will call her once week for further recommendations pending the lab results. I would consider increasing hctz to 25mg .  Addendum: BMP results indicate improvement in her GFR from prior. Her potassium is on the upper limit of normal 4.9 so will need to monitor this closely given her medication list including losartan and spironolactone. Will increase the hctz to 25mg  which may also help with balancing her serum potassium 01/30/20 10:32 PM

## 2020-01-29 NOTE — Assessment & Plan Note (Addendum)
I revisited the discussion of colon and breast cancer screening. She continues to decline at this time.  I also revisited the discussion of vaccination against COVID 19 infection. She continues to decline at this time. When I tried to inquire into her reasoning, she says she just doesn't want to talk about it.

## 2020-01-30 LAB — BASIC METABOLIC PANEL
BUN/Creatinine Ratio: 23 (ref 12–28)
BUN: 34 mg/dL — ABNORMAL HIGH (ref 8–27)
CO2: 20 mmol/L (ref 20–29)
Calcium: 9.7 mg/dL (ref 8.7–10.3)
Chloride: 101 mmol/L (ref 96–106)
Creatinine, Ser: 1.46 mg/dL — ABNORMAL HIGH (ref 0.57–1.00)
GFR calc Af Amer: 41 mL/min/{1.73_m2} — ABNORMAL LOW (ref >59)
GFR calc non Af Amer: 36 mL/min/{1.73_m2} — ABNORMAL LOW (ref >59)
Glucose: 123 mg/dL — ABNORMAL HIGH (ref 65–99)
Potassium: 4.9 mmol/L (ref 3.5–5.2)
Sodium: 139 mmol/L (ref 134–144)

## 2020-02-01 ENCOUNTER — Other Ambulatory Visit: Payer: Self-pay | Admitting: Internal Medicine

## 2020-02-01 MED ORDER — LOSARTAN POTASSIUM-HCTZ 100-25 MG PO TABS: 1.0000 | ORAL_TABLET | Freq: Every day | ORAL | 1 refills | Status: DC

## 2020-02-01 NOTE — Progress Notes (Signed)
Spoke with pt's daughter, [name], to notify her of increasing losartan-hctz from 100-12.5mg  to 100-25mg . She expressed understanding and agrees to have Myeasha come back in 2-4w for blood pressure and lab recheck.  Mitzi Hansen, MD Internal Medicine Resident PGY-2 [facility] Pager: [phone] 02/01/2020 9:16 AM

## 2020-03-07 ENCOUNTER — Other Ambulatory Visit: Payer: Self-pay | Admitting: Internal Medicine

## 2020-03-07 DIAGNOSIS — Z794 Long term (current) use of insulin: Secondary | ICD-10-CM

## 2020-03-07 DIAGNOSIS — E113593 Type 2 diabetes mellitus with proliferative diabetic retinopathy without macular edema, bilateral: Secondary | ICD-10-CM

## 2020-03-22 ENCOUNTER — Ambulatory Visit: Payer: Medicare Other | Admitting: Podiatry

## 2020-03-22 ENCOUNTER — Other Ambulatory Visit: Payer: Medicare Other | Admitting: Orthotics

## 2020-04-04 ENCOUNTER — Other Ambulatory Visit: Payer: Self-pay | Admitting: Internal Medicine

## 2020-04-05 DIAGNOSIS — R1111 Vomiting without nausea: Secondary | ICD-10-CM | POA: Diagnosis not present

## 2020-04-05 DIAGNOSIS — R55 Syncope and collapse: Secondary | ICD-10-CM | POA: Diagnosis not present

## 2020-04-15 ENCOUNTER — Ambulatory Visit (INDEPENDENT_AMBULATORY_CARE_PROVIDER_SITE_OTHER): Payer: Medicare Other | Admitting: Internal Medicine

## 2020-04-15 ENCOUNTER — Encounter: Payer: Self-pay | Admitting: Internal Medicine

## 2020-04-15 ENCOUNTER — Other Ambulatory Visit: Payer: Self-pay

## 2020-04-15 VITALS — BP 137/74 | HR 86 | Temp 98.1°F | Ht 66.0 in | Wt 176.0 lb

## 2020-04-15 DIAGNOSIS — F32A Depression, unspecified: Secondary | ICD-10-CM | POA: Diagnosis not present

## 2020-04-15 DIAGNOSIS — E113593 Type 2 diabetes mellitus with proliferative diabetic retinopathy without macular edema, bilateral: Secondary | ICD-10-CM | POA: Diagnosis not present

## 2020-04-15 DIAGNOSIS — Z0000 Encounter for general adult medical examination without abnormal findings: Secondary | ICD-10-CM | POA: Diagnosis not present

## 2020-04-15 DIAGNOSIS — Z1159 Encounter for screening for other viral diseases: Secondary | ICD-10-CM | POA: Diagnosis not present

## 2020-04-15 DIAGNOSIS — Z794 Long term (current) use of insulin: Secondary | ICD-10-CM | POA: Diagnosis not present

## 2020-04-15 DIAGNOSIS — N1832 Chronic kidney disease, stage 3b: Secondary | ICD-10-CM

## 2020-04-15 DIAGNOSIS — I152 Hypertension secondary to endocrine disorders: Secondary | ICD-10-CM

## 2020-04-15 DIAGNOSIS — E1159 Type 2 diabetes mellitus with other circulatory complications: Secondary | ICD-10-CM

## 2020-04-15 LAB — POCT GLYCOSYLATED HEMOGLOBIN (HGB A1C): Hemoglobin A1C: 6.1 % — AB (ref 4.0–5.6)

## 2020-04-15 LAB — BASIC METABOLIC PANEL
Anion gap: 13 (ref 5–15)
BUN: 36 mg/dL — ABNORMAL HIGH (ref 8–23)
CO2: 19 mmol/L — ABNORMAL LOW (ref 22–32)
Calcium: 9.9 mg/dL (ref 8.9–10.3)
Chloride: 106 mmol/L (ref 98–111)
Creatinine, Ser: 1.72 mg/dL — ABNORMAL HIGH (ref 0.44–1.00)
GFR, Estimated: 31 mL/min — ABNORMAL LOW (ref >60)
Glucose, Bld: 106 mg/dL — ABNORMAL HIGH (ref 70–99)
Potassium: 5.6 mmol/L — ABNORMAL HIGH (ref 3.5–5.1)
Sodium: 138 mmol/L (ref 135–145)

## 2020-04-15 LAB — GLUCOSE, CAPILLARY: Glucose-Capillary: 100 mg/dL — ABNORMAL HIGH (ref 70–99)

## 2020-04-15 NOTE — Assessment & Plan Note (Addendum)
Current medications: amlodipine 10mg , losartan-hctz 100-25mg , spironolactone 50mg  Denies adverse medication effects. Blood pressure is improved from last visit but remains mildly elevated over goal BP Readings from Last 3 Encounters:  04/15/20 137/74  01/29/20 (!) 141/69  10/28/19 (!) 148/59   Plan -BMP today -no medication changes at this time -BP goal <130/80 -f/u 3 mo

## 2020-04-15 NOTE — Addendum Note (Signed)
Addended by: Truddie Crumble on: 04/15/2020 12:31 PM   Modules accepted: Orders

## 2020-04-15 NOTE — Patient Instructions (Signed)
Great job on the weight loss! I want you to keep doing what you are doing with your diet. Weight loss helps improve your blood pressure and blood sugar control.  Please schedule an eye exam for retinal screening at your earliest convenience!

## 2020-04-15 NOTE — Assessment & Plan Note (Signed)
BMP obtained at today's visit

## 2020-04-15 NOTE — Assessment & Plan Note (Signed)
Continues to decline COVID 19 vaccination.

## 2020-04-15 NOTE — Assessment & Plan Note (Addendum)
Current medications: victoza 1.2mg  daily, metformin 500mg  daily, losartan-hctz 100-25mg  Denies adverse medication effects Last A1C in December was 6.2 A1C today is 6.1 Foot exam UTD She is due for retinal screening but remains resistent to this. Plan -no medication changes at this time -f/u 62mo. Consider discontinuing metformin if A1C remains appropriate at her next office visit

## 2020-04-15 NOTE — Assessment & Plan Note (Addendum)
Current medications: celexa 20mg  daily PHQ9 is 14 today Prior scores in September and December were 4 and 3 respectively. Based on my discussion with her today, her PHQ9 is incongruent with her mood and she feels that her symptoms are very well controlled on celexa.  Plan -continue celexa

## 2020-04-15 NOTE — Progress Notes (Signed)
Office Visit   [name], female    DOB: 1950-08-06, 70 y.o.   MRN: [id number]  Subjective:  CC: hypertension, diabetes, depression, insomnia  HPI 70 y.o. presents today for a 3 month recheck of her chronic medical conditions. Please refer to problem based charting for details of assessment and plan.      ACTIVE MEDICATIONS   Outpatient Medications Prior to Visit  Medication Sig Dispense Refill  . losartan-hydrochlorothiazide (HYZAAR) 100-25 MG tablet TAKE 1 TABLET BY MOUTH EVERY DAY 30 tablet 2  . Accu-Chek Softclix Lancets lancets Use to check blood sugar 2 times per day. 100 each 3  . albuterol (VENTOLIN HFA) 108 (90 Base) MCG/ACT inhaler INHALE 2 PUFFS into lungs EVERY 6 HOURS AS NEEDED FOR WHEEZING OR SHORTNESS OF BREATH 36 g 3  . amLODipine (NORVASC) 10 MG tablet Take 1 tablet (10 mg total) by mouth daily. 90 tablet 3  . aspirin 81 MG EC tablet Take 1 tablet (81 mg total) by mouth daily. 30 tablet 11  . atorvastatin (LIPITOR) 80 MG tablet Take 1 tablet (80 mg total) by mouth daily. 90 tablet 3  . Blood Glucose Monitoring Suppl (ACCU-CHEK GUIDE ME) w/Device KIT Use to check blood sugar one time per day. 1 kit 0  . citalopram (CELEXA) 20 MG tablet Take 1 tablet (20 mg total) by mouth at bedtime. 90 tablet 1  . fluticasone (FLONASE) 50 MCG/ACT nasal spray USE 2 SPRAYS IN EACH NOSTRIL DAILY 32 g 1  . glucose blood (ACCU-CHEK GUIDE) test strip Use to check blood sugar two times per day. 100 each 3  . Incontinence Supply Disposable (BLADDER CONTROL PAD REGULAR) MISC Use daily as directed 100 each 11  . Insulin Pen Needle (PEN NEEDLES) 32G X 4 MM MISC Use once daily to inject victoza 100 each 3  . Lancets Misc. (ACCU-CHEK FASTCLIX LANCET) KIT Use to check blood sugar twice a day as discussed. 1 kit 1  . metFORMIN (GLUCOPHAGE-XR) 500 MG 24 hr tablet Take one tablet by mouth daily with breakfast. 90 tablet 0  . spironolactone (ALDACTONE) 50 MG tablet Take 1 tablet (50  mg total) by mouth daily. 90 tablet 1  . traZODone (DESYREL) 50 MG tablet Take 0.5-1 tablets (25-50 mg total) by mouth at bedtime. 90 tablet 0  . VICTOZA 18 MG/3ML SOPN inject 0.6 MG into THE SKIN daily FOR THE FIRST WEEK, THEN INCREASE TO 1.2 MG daily 12 mL 1   No facility-administered medications prior to visit.     ROS  Review of Systems  Constitutional: Negative for appetite change and unexpected weight change.  Gastrointestinal: Negative for abdominal pain and blood in stool.  Neurological: Negative for dizziness, light-headedness and headaches.  Psychiatric/Behavioral: Negative for decreased concentration and self-injury. The patient is not nervous/anxious.     Objective:   BP 137/74 (BP Location: Left Arm, Patient Position: Sitting, Cuff Size: Normal)   Pulse 86   Temp 98.1 F (36.7 C) (Oral)   Ht _0  (1.676 m)   Wt 176 lb (79.8 kg)   SpO2 100%   BMI 28.41 kg/m  Wt Readings from Last 3 Encounters:  04/15/20 176 lb (79.8 kg)  01/29/20 186 lb 14.4 oz (84.8 kg)  10/28/19 190 lb 3.2 oz (86.3 kg)   BP Readings from Last 3 Encounters:  04/15/20 137/74  01/29/20 (!) 141/69  10/28/19 (!) 148/59   Physical Exam Constitutional:      Appearance: She is obese.  Cardiovascular:  Rate and Rhythm: Normal rate and regular rhythm.  Pulmonary:     Effort: Pulmonary effort is normal.     Breath sounds: Normal breath sounds.  Skin:    General: Skin is warm and dry.  Neurological:     Mental Status: She is alert.  Psychiatric:        Mood and Affect: Mood normal.     Health Maintenance:   Health Maintenance  Topic Date Due  . Hepatitis C Screening  Never done  . OPHTHALMOLOGY EXAM  04/15/2020 (Originally 06/26/2018)  . COVID-19 Vaccine (1) 05/01/2020 (Originally 12/16/1955)  . INFLUENZA VACCINE  05/12/2020 (Originally 09/13/2019)  . MAMMOGRAM  01/28/2021 (Originally 11/21/2018)  . COLONOSCOPY (Pts 45-74yr Insurance coverage will need to be confirmed)  01/28/2021  (Originally 03/23/2018)  . TETANUS/TDAP  01/28/2021 (Originally 09/27/2019)  . PNA vac Low Risk Adult (1 of 2 - PCV13) 01/28/2021 (Originally 12/16/2013)  . HEMOGLOBIN A1C  07/16/2020  . FOOT EXAM  09/15/2020  . LIPID PANEL  10/27/2020  . DEXA SCAN  Completed  . HPV VACCINES  Aged Out     Assessment & Plan:   Problem List Items Addressed This Visit      Cardiovascular and Mediastinum   Hypertension associated with diabetes ([place] (Chronic)    Current medications: amlodipine 138m losartan-hctz 100-2531mspironolactone 38m47mnies adverse medication effects. Blood pressure is improved from last visit but remains mildly elevated over goal BP Readings from Last 3 Encounters:  04/15/20 137/74  01/29/20 (!) 141/69  10/28/19 (!) 148/59   Plan -BMP today -no medication changes at this time -BP goal <130/80 -f/u 3 mo       Relevant Orders   Basic metabolic panel     Endocrine   Type 2 diabetes mellitus with diabetic retinopathy (HCC)[place] (Chronic)    Current medications: victoza 1.2mg 94mly, metformin 500mg 60my, losartan-hctz 100-25mg D57ms adverse medication effects Last A1C in December was 6.2 A1C today is 6.1 Foot exam UTD She is due for retinal screening but remains resistent to this. Plan -no medication changes at this time -f/u 34mo. Co76moer discontinuing metformin if A1C remains appropriate at her next office visit      Relevant Orders   POC Hbg A1C (Completed)     Genitourinary   CKD (chronic kidney disease) stage 3, GFR 30-59 ml/min (HCC) (Chronic)    BMP obtained at today's visit        Other   Healthcare maintenance (Chronic)    Continues to decline COVID 19 vaccination.      Severe obesity (BMI >= 40) (HCC) (Chronic)    Down 50lb over the past year with diet control. Pt congratulated.  Semaglutaide considered but not appropriate given her renal function.      Depression (Chronic)    Current medications: celexa 20mg [id number] is 14  today Prior scores in September and December were 4 and 3 respectively. Based on my discussion with her today, her PHQ9 is incongruent with her mood and she feels that her symptoms are very well controlled on celexa.  Plan -continue celexa          Other Visit Diagnoses    Encounter for HCV screening test for low risk patient       Relevant Orders   Hepatitis C antibody        Pt discussed with Dr. Williams Marty Heckrnal Medicine Resident PGY-2 [facility] Pager: [phone] 10:24 AM

## 2020-04-15 NOTE — Assessment & Plan Note (Signed)
Down 50lb over the past year with diet control. Pt congratulated.  Semaglutaide considered but not appropriate given her renal function.

## 2020-04-16 LAB — HEPATITIS C ANTIBODY: Hep C Virus Ab: <0.1 s/co ratio (ref 0.0–0.9)

## 2020-04-25 NOTE — Progress Notes (Signed)
Internal Medicine Clinic Attending  Case discussed with Dr. Christian  At the time of the visit.  We reviewed the resident's history and exam and pertinent patient test results.  I agree with the assessment, diagnosis, and plan of care documented in the resident's note.  

## 2020-05-17 ENCOUNTER — Telehealth: Payer: Self-pay

## 2020-05-17 NOTE — Telephone Encounter (Signed)
RTC, pt states her legs are very dry and itchy.  States they get like this about once a year, she is using [employer] lotion/cream 3-4 times a day without relief.  She is asking if there is something that can be called in for her to use. LOV 04/2020 Forwarding to blue team. Laurence Compton, RN,BSN

## 2020-05-17 NOTE — Telephone Encounter (Signed)
Pls contact pt regarding leg pain [phone]

## 2020-05-18 NOTE — Telephone Encounter (Signed)
I would recommend any moisturizing cream (CeraVe or Eucerin). If it persists we may need to see her in clinic.Thanks

## 2020-05-19 NOTE — Telephone Encounter (Signed)
TC to patient, she was notified of Dr. Olevia Perches instructions below and she verbalized understanding. SChaplin, RN,BSN

## 2020-06-16 ENCOUNTER — Encounter: Payer: Self-pay | Admitting: Internal Medicine

## 2020-06-16 ENCOUNTER — Encounter: Payer: Self-pay | Admitting: *Deleted

## 2020-06-16 NOTE — Progress Notes (Unsigned)

## 2020-06-16 NOTE — Progress Notes (Signed)
Things That May Be Affecting Your Health:  Alcohol  Hearing loss  Pain   x Depression  Home Safety  Sexual Health  x Diabetes x Lack of physical activity  Stress   Difficulty with daily activities  Loneliness  Tiredness   Drug use  Medicines  Tobacco use   Falls  Motor Vehicle Safety  Weight   Food choices  Oral Health  Other    YOUR PERSONALIZED HEALTH PLAN : 1. Schedule your next subsequent Medicare Wellness visit in one year 2. Attend all of your regular appointments to address your medical issues 3. Complete the preventative screenings and services   Annual Wellness Visit   Medicare Covered Preventative Screenings and [place] Men and Women Who How Often Need? Date of Last Service Action  Abdominal Aortic Aneurysm Adults with AAA risk factors Once      Alcohol Misuse and Counseling All Adults Screening once a year if no alcohol misuse. Counseling up to 4 face to face sessions.     Bone Density Measurement  Adults at risk for osteoporosis Once every 2 yrs      Lipid Panel Z13.6 All adults without CV disease Once every 5 yrs       Colorectal Cancer   Stool sample or  Colonoscopy All adults 38 and older   Once every year  Every 10 years        Depression All Adults Once a year x Today   Diabetes Screening Blood glucose, post glucose load, or GTT Z13.1  All adults at risk  Pre-diabetics  Once per year  Twice per year x     Diabetes  Self-Management Training All adults Diabetics 10 hrs first year; 2 hours subsequent years. Requires Copay x    Glaucoma  Diabetics  Family history of glaucoma  African Americans 71 yrs +  Hispanic Americans 40 yrs + Annually - requires coppay x     Hepatitis C Z72.89 or F19.20  High Risk for HCV  Born between 1945 and 1965  Annually  Once      HIV Z11.4 All adults based on risk  Annually btw ages 55 & 62 regardless of risk  Annually > 65 yrs if at increased risk      Lung Cancer Screening  Asymptomatic adults aged 96-77 with 30 pack yr history and current smoker OR quit within the last 15 yrs Annually Must have counseling and shared decision making documentation before first screen      Medical Nutrition Therapy Adults with   Diabetes  Renal disease  Kidney transplant within past 3 yrs 3 hours first year; 2 hours subsequent years     Obesity and Counseling All adults Screening once a year Counseling if BMI 30 or higher  Today   Tobacco Use Counseling Adults who use tobacco  Up to 8 visits in one year     Vaccines Z23  Hepatitis B  Influenza   Pneumonia  Adults   Once  Once every flu season  Two different vaccines separated by one year     Next Annual Wellness Visit People with Medicare Every year  Today     Services & Screenings Women Who How Often Need  Date of Last Service Action  Mammogram  Z12.31 Women over 18 One baseline ages 51-39. Annually ager 40 yrs+      Pap tests All women Annually if high risk. Every 2 yrs for normal risk women  Screening for cervical cancer with   Pap (Z01.419 nl or Z01.411abnl) &  HPV Z11.51 Women aged 31 to 74 Once every 5 yrs     Screening pelvic and breast exams All women Annually if high risk. Every 2 yrs for normal risk women     Sexually Transmitted Diseases  Chlamydia  Gonorrhea  Syphilis All at risk adults Annually for non pregnant females at increased risk         [place] Men Who How Ofter Need  Date of Last Service Action  Prostate Cancer - DRE & PSA Men over 50 Annually.  DRE might require a copay.        Sexually Transmitted Diseases  Syphilis All at risk adults Annually for men at increased risk      Health Maintenance List Health Maintenance  Topic Date Due  . COVID-19 Vaccine (1) Never done  . OPHTHALMOLOGY EXAM  06/26/2018  . MAMMOGRAM  01/28/2021 (Originally 11/21/2018)  . COLONOSCOPY (Pts 45-46yrs Insurance coverage will need to be confirmed)  01/28/2021  (Originally 03/23/2018)  . TETANUS/TDAP  01/28/2021 (Originally 09/27/2019)  . PNA vac Low Risk Adult (1 of 2 - PCV13) 01/28/2021 (Originally 12/16/2013)  . HEMOGLOBIN A1C  07/16/2020  . INFLUENZA VACCINE  09/12/2020  . FOOT EXAM  09/15/2020  . LIPID PANEL  10/27/2020  . DEXA SCAN  Completed  . Hepatitis C Screening  Completed  . HPV VACCINES  Aged Out

## 2020-07-07 ENCOUNTER — Other Ambulatory Visit: Payer: Self-pay | Admitting: Internal Medicine

## 2020-07-07 DIAGNOSIS — I152 Hypertension secondary to endocrine disorders: Secondary | ICD-10-CM

## 2020-07-07 DIAGNOSIS — E1159 Type 2 diabetes mellitus with other circulatory complications: Secondary | ICD-10-CM

## 2020-08-01 ENCOUNTER — Ambulatory Visit (INDEPENDENT_AMBULATORY_CARE_PROVIDER_SITE_OTHER): Payer: Medicare Other | Admitting: Internal Medicine

## 2020-08-01 ENCOUNTER — Encounter: Payer: Self-pay | Admitting: Internal Medicine

## 2020-08-01 ENCOUNTER — Other Ambulatory Visit: Payer: Self-pay

## 2020-08-01 VITALS — BP 126/47 | HR 78 | Temp 97.7°F | Ht 65.0 in | Wt 169.1 lb

## 2020-08-01 DIAGNOSIS — Z794 Long term (current) use of insulin: Secondary | ICD-10-CM | POA: Diagnosis not present

## 2020-08-01 DIAGNOSIS — I152 Hypertension secondary to endocrine disorders: Secondary | ICD-10-CM

## 2020-08-01 DIAGNOSIS — Z8673 Personal history of transient ischemic attack (TIA), and cerebral infarction without residual deficits: Secondary | ICD-10-CM | POA: Diagnosis not present

## 2020-08-01 DIAGNOSIS — G4700 Insomnia, unspecified: Secondary | ICD-10-CM | POA: Diagnosis not present

## 2020-08-01 DIAGNOSIS — E1159 Type 2 diabetes mellitus with other circulatory complications: Secondary | ICD-10-CM | POA: Diagnosis not present

## 2020-08-01 DIAGNOSIS — F32A Depression, unspecified: Secondary | ICD-10-CM | POA: Diagnosis not present

## 2020-08-01 DIAGNOSIS — E113593 Type 2 diabetes mellitus with proliferative diabetic retinopathy without macular edema, bilateral: Secondary | ICD-10-CM

## 2020-08-01 LAB — GLUCOSE, CAPILLARY: Glucose-Capillary: 98 mg/dL (ref 70–99)

## 2020-08-01 LAB — POCT GLYCOSYLATED HEMOGLOBIN (HGB A1C): Hemoglobin A1C: 5.6 % (ref 4.0–5.6)

## 2020-08-01 MED ORDER — ATORVASTATIN CALCIUM 80 MG PO TABS: 80.0000 mg | ORAL_TABLET | Freq: Every day | ORAL | 3 refills | Status: DC

## 2020-08-01 MED ORDER — TRAZODONE HCL 50 MG PO TABS: 25.0000 mg | ORAL_TABLET | Freq: Every day | ORAL | 0 refills | Status: DC

## 2020-08-01 MED ORDER — SPIRONOLACTONE 50 MG PO TABS: 50.0000 mg | ORAL_TABLET | Freq: Every day | ORAL | 1 refills | Status: DC

## 2020-08-01 MED ORDER — METFORMIN HCL ER 500 MG PO TB24: ORAL_TABLET | ORAL | 0 refills | Status: DC

## 2020-08-01 MED ORDER — AMLODIPINE BESYLATE 10 MG PO TABS: 1.0000 | ORAL_TABLET | Freq: Every day | ORAL | 3 refills | Status: DC

## 2020-08-01 MED ORDER — VICTOZA 18 MG/3ML ~~LOC~~ SOPN: 1.2000 mg | PEN_INJECTOR | Freq: Every day | SUBCUTANEOUS | 1 refills | Status: DC

## 2020-08-01 MED ORDER — LOSARTAN POTASSIUM-HCTZ 100-25 MG PO TABS: 1.0000 | ORAL_TABLET | Freq: Every day | ORAL | 1 refills | Status: DC

## 2020-08-01 NOTE — Patient Instructions (Signed)
Your A1C is 5.6 today! I'm so happy to see that you have lost weight as well. Keep up the good work and come back to see me in 3 months!

## 2020-08-01 NOTE — Assessment & Plan Note (Signed)
PHQ score is 0 today -continue celexa

## 2020-08-01 NOTE — Assessment & Plan Note (Signed)
Continue aspirin 81mg  and lipitor 80mg 

## 2020-08-01 NOTE — Assessment & Plan Note (Addendum)
HYPERTENSION FOLLOW-UP: Current medications: amlodipine 10mg  daily, losartan-hctz 100-25mg  daily, spirolactone 50mg  Med Adherence: [x]  Yes    []  No Medication side effects: []  Yes    [x]  No  Assessment: Blood pressure is at goal in the office today. BP Readings from Last 3 Encounters:  08/01/20 (!) 126/47  04/15/20 137/74  01/29/20 (!) 141/69    Plan -continue current management -follow up 3 mo

## 2020-08-01 NOTE — Assessment & Plan Note (Signed)
She is ecstatic about her weight loss. She is down another 7# from January. She reports feeling much better. -continue victoza

## 2020-08-01 NOTE — Assessment & Plan Note (Signed)
DIABETES TYPE 2 FOLLOW-UP: Anti-hyperglycemic agents: metformin xr 500mg  daily, victoza 1.2mg  daily Last A1C: 6.1 A1C today 5.6  Med Adherence:  [x]  Yes    []  No Medication side effects:  []  Yes    [x]  No  Diet Adherence: [x]  Yes    []  No Hypoglycemic episodes?: no Numbness of the feet? []  Yes    [x]  No Retinopathy hx? [x]  Yes    []  No Last eye exam: 2019 Last foot exam negative within the past year.  Assessment: well controlled type 2 diabetes mellitus with diabetic retinopathy  Plan:  -continue current management -will consider discontinuing the metformin at her next visit if A1C continues to indicate good control -encouraged retinal screening -f/u 3 months

## 2020-08-01 NOTE — Progress Notes (Signed)
Office Visit   [name], female    DOB: [birth year], 70 y.o.   MRN: [id number]  Subjective:   70 y.o. presents today for a 3 month recheck of hypertension, diabetes, depression, obesity. Please refer to problem based charting for details of assessment and plan.      ACTIVE MEDICATIONS   Outpatient Medications Prior to Visit  Medication Sig Dispense Refill   Aspirin 81 MG CAPS Take by mouth.     Accu-Chek Softclix Lancets lancets Use to check blood sugar 2 times per day. 100 each 3   albuterol (VENTOLIN HFA) 108 (90 Base) MCG/ACT inhaler INHALE 2 PUFFS into lungs EVERY 6 HOURS AS NEEDED FOR WHEEZING OR SHORTNESS OF BREATH 36 g 3   Blood Glucose Monitoring Suppl (ACCU-CHEK GUIDE ME) w/Device KIT Use to check blood sugar one time per day. 1 kit 0   citalopram (CELEXA) 20 MG tablet Take 1 tablet (20 mg total) by mouth at bedtime. 90 tablet 1   fluticasone (FLONASE) 50 MCG/ACT nasal spray USE 2 SPRAYS IN EACH NOSTRIL DAILY 32 g 1   glucose blood (ACCU-CHEK GUIDE) test strip Use to check blood sugar two times per day. 100 each 3   Incontinence Supply Disposable (BLADDER CONTROL PAD REGULAR) MISC Use daily as directed 100 each 11   Insulin Pen Needle (PEN NEEDLES) 32G X 4 MM MISC Use once daily to inject victoza 100 each 3   Lancets Misc. (ACCU-CHEK FASTCLIX LANCET) KIT Use to check blood sugar twice a day as discussed. 1 kit 1   QC LO-DOSE ASPIRIN 81 MG EC tablet TAKE 1 TABLET BY MOUTH EVERY DAY 30 tablet 11   amLODipine (NORVASC) 10 MG tablet TAKE 1 TABLET BY MOUTH EVERY DAY 90 tablet 3   atorvastatin (LIPITOR) 80 MG tablet Take 1 tablet (80 mg total) by mouth daily. 90 tablet 3   losartan-hydrochlorothiazide (HYZAAR) 100-25 MG tablet TAKE 1 TABLET BY MOUTH EVERY DAY 30 tablet 2   metFORMIN (GLUCOPHAGE-XR) 500 MG 24 hr tablet Take one tablet by mouth daily with breakfast. 90 tablet 0   spironolactone (ALDACTONE) 50 MG tablet Take 1 tablet (50 mg total) by mouth daily. 90  tablet 1   traZODone (DESYREL) 50 MG tablet Take 0.5-1 tablets (25-50 mg total) by mouth at bedtime. 90 tablet 0   VICTOZA 18 MG/3ML SOPN inject 0.6 MG into THE SKIN daily FOR THE FIRST WEEK, THEN INCREASE TO 1.2 MG daily 12 mL 1   No facility-administered medications prior to visit.     Objective:   BP (!) 126/47 (BP Location: Left Arm, Patient Position: Sitting, Cuff Size: Normal)   Pulse 78   Temp 97.7 F (36.5 C) (Oral)   Ht '5\' 5"'  (1.651 m)   Wt 169 lb 1.6 oz (76.7 kg)   SpO2 100%   BMI 28.14 kg/m  Wt Readings from Last 3 Encounters:  08/01/20 169 lb 1.6 oz (76.7 kg)  04/15/20 176 lb (79.8 kg)  01/29/20 186 lb 14.4 oz (84.8 kg)   BP Readings from Last 3 Encounters:  08/01/20 (!) 126/47  04/15/20 137/74  01/29/20 (!) 141/69   Physical exam General: chronically ill appearing, sitting in wheelchair Cardiac: RRR Pulm: lungs clear Psych: normal affect  Health Maintenance:   Health Maintenance  Topic Date Due   COVID-19 Vaccine (1) Never done   Zoster Vaccines- Shingrix (1 of 2) Never done   OPHTHALMOLOGY EXAM  06/26/2018   MAMMOGRAM  01/28/2021 (Originally 11/21/2018)  COLONOSCOPY (Pts 45-66yr Insurance coverage will need to be confirmed)  01/28/2021 (Originally 03/23/2018)   TETANUS/TDAP  01/28/2021 (Originally 09/27/2019)   PNA vac Low Risk Adult (1 of 2 - PCV13) 01/28/2021 (Originally 12/16/2013)   INFLUENZA VACCINE  09/12/2020   FOOT EXAM  09/15/2020   LIPID PANEL  10/27/2020   HEMOGLOBIN A1C  11/01/2020   DEXA SCAN  Completed   Hepatitis C Screening  Completed   HPV VACCINES  Aged Out     Assessment & Plan:   Problem List Items Addressed This Visit       Cardiovascular and Mediastinum   Hy[place] with diabetes ([place] (Chronic)    HYPERTENSION FOLLOW-UP: Current medications: amlodipine 13mdaily, losartan-hctz 100-2578maily, spirolactone 88m73md Adherence: '[x]'  Yes    '[]'  No Medication side effects: '[]'  Yes    '[x]'  No  Assessment: Blood  pressure is at goal in the office today. BP Readings from Last 3 Encounters:  08/01/20 (!) 126/47  04/15/20 137/74  01/29/20 (!) 141/69    Plan -continue current management -follow up 3 mo        Relevant Medications   Aspirin 81 MG CAPS   liraglutide (VICTOZA) 18 MG/3ML SOPN   metFORMIN (GLUCOPHAGE-XR) 500 MG 24 hr tablet   spironolactone (ALDACTONE) 50 MG tablet   losartan-hydrochlorothiazide (HYZAAR) 100-25 MG tablet   atorvastatin (LIPITOR) 80 MG tablet   amLODipine (NORVASC) 10 MG tablet     Endocrine   Type 2 diabetes mellitus with diabetic retinopathy (HCC) - Primary (Chronic)     DIABETES TYPE 2 FOLLOW-UP: Anti-hyperglycemic agents: metformin xr 500mg10mly, victoza 1.2mg d32my Last A1C: 6.1 A1C today 5.6  Med Adherence:  '[x]'  Yes    '[]'  No Medication side effects:  '[]'  Yes    '[x]'  No  Diet Adherence: '[x]'  Yes    '[]'  No Hypoglycemic episodes?: no Numbness of the feet? '[]'  Yes    '[x]'  No Retinopathy hx? '[x]'  Yes    '[]'  No Last eye exam: 2019 Last foot exam negative within the past year.  Assessment: well controlled type 2 diabetes mellitus with diabetic retinopathy  Plan:  -continue current management -will consider discontinuing the metformin at her next visit if A1C continues to indicate good control -encouraged retinal screening -f/u 3 months        Relevant Medications   Aspirin 81 MG CAPS   liraglutide (VICTOZA) 18 MG/3ML SOPN   metFORMIN (GLUCOPHAGE-XR) 500 MG 24 hr tablet   spironolactone (ALDACTONE) 50 MG tablet   losartan-hydrochlorothiazide (HYZAAR) 100-25 MG tablet   atorvastatin (LIPITOR) 80 MG tablet   Other Relevant Orders   POC Hbg A1C (Completed)     Other   Severe obesity (BMI >= 40) (HCC) (Chronic)    She is ecstatic about her weight loss. She is down another 7# from January. She reports feeling much better. -continue victoza       Relevant Medications   liraglutide (VICTOZA) 18 MG/3ML SOPN   metFORMIN (GLUCOPHAGE-XR) 500 MG 24 hr  tablet   Depression (Chronic)    PHQ score is 0 today -continue celexa       Relevant Medications   traZODone (DESYREL) 50 MG tablet   History of stroke    Continue aspirin 81mg a63mipitor 80mg   72mOther Visit Diagnoses     Insomnia, unspecified type       Relevant Medications   traZODone (DESYREL) 50 MG tablet      Follow up in  3 months with labs: BMP, A1C   Pt discussed with Dr. Marty Heck, MD Internal Medicine Resident PGY-2 [facility] Pager: [phone] 08/01/2020 12:52 PM

## 2020-08-11 NOTE — Progress Notes (Signed)
Internal Medicine Clinic Attending  Case discussed with Dr. Christian  At the time of the visit.  We reviewed the resident's history and exam and pertinent patient test results.  I agree with the assessment, diagnosis, and plan of care documented in the resident's note.  

## 2020-09-02 ENCOUNTER — Encounter: Payer: Self-pay | Admitting: Pharmacist

## 2020-09-02 ENCOUNTER — Ambulatory Visit (INDEPENDENT_AMBULATORY_CARE_PROVIDER_SITE_OTHER): Payer: Medicare Other | Admitting: Pharmacist

## 2020-09-02 VITALS — Ht 64.0 in | Wt 169.0 lb

## 2020-09-02 DIAGNOSIS — Z0000 Encounter for general adult medical examination without abnormal findings: Secondary | ICD-10-CM

## 2020-09-02 NOTE — Progress Notes (Signed)
This AWV is being conducted by [place] only. The patient was located at home and I was located in [facility]. The patient's identity was confirmed using their DOB and current address. The patient or his/her legal guardian has consented to being evaluated through a telephone encounter and understands the associated risks (an examination cannot be done and the patient may need to come in for an appointment) / benefits (allows the patient to remain at home, decreasing exposure to coronavirus). I personally spent 40 minutes conducting the AWV.  Subjective:   Chelsea [name] is a 70 y.o. female who presents for a Medicare Annual Wellness Visit.  The following items have been reviewed and updated today in the appropriate area in the EMR.   Health Risk Assessment  Height, weight, BMI, and BP Visual acuity if needed Depression screen Fall risk / safety level Advance directive discussion Medical and family history were reviewed and updated Updating list of other providers & suppliers Medication reconciliation, including over the counter medicines Cognitive screen Written screening schedule Risk Factor list Personalized health advice, risky behaviors, and treatment advice  Social History   Social History Narrative   Current Social History 09/02/2020        Patient lives alone in a home which is 1 story There are 3 steps up to the entrance the patient uses.       Patient's method of transportation is via family member.      The highest level of education was some high school.      The patient currently retired.      Identified important Relationships are "my daughter"       Pets : 0       Interests / Fun: "read"       Current Stressors: "no"       Religious / Personal Beliefs: "[employer]aptist"        Cardiac Risk Factors include: advanced age (>66men, >64 women);sedentary lifestyle;diabetes mellitus;hypertension;dyslipidemia    Objective:    Vitals: Ht 5\' 4"  (1.626 m)   Wt 169  lb (76.7 kg)   BMI 29.01 kg/m  Vitals are patient reported  Activities of Daily Living In your present state of health, do you have any difficulty performing the following activities: 09/02/2020 08/01/2020  Hearing? N N  Vision? Y Y  Difficulty concentrating or making decisions? N N  Walking or climbing stairs? Y Y  Comment - -  Dressing or bathing? N Y  Doing errands, shopping? N Y  Conservation officer, nature and eating ? N -  Using the Toilet? N -  In the past six months, have you accidently leaked urine? N -  Do you have problems with loss of bowel control? N -  Managing your Medications? N -  Managing your Finances? N -  Housekeeping or managing your Housekeeping? N -  Some recent data might be hidden    Goals  Goals      Blood Pressure < 130/80     Blood Pressure < 130/80     Exercise 3x per week (30 min per time)     HEMOGLOBIN A1C < 7.0     LDL CALC < 100     LDL CALC < 100        Fall Risk Fall Risk  09/02/2020 08/01/2020 04/15/2020 01/29/2020 10/28/2019  Falls in the past year? 0 0 0 0 0  Number falls in past yr: 0 - - - -  Comment - - - - -  Injury with Fall? 0 - - - -  Risk Factor Category  - - - - -  Risk for fall due to : No Fall Risks No Fall Risks Impaired balance/gait Impaired balance/gait;Impaired mobility Impaired balance/gait;Impaired mobility  Risk for fall due to: Comment - - - - -  Follow up Falls evaluation completed;Falls prevention discussed - - Falls evaluation completed Falls evaluation completed    Depression Screen PHQ 2/9 Scores 09/02/2020 08/01/2020 04/15/2020 01/29/2020  PHQ - 2 Score 0 0 5 2  PHQ- 9 Score - 0 14 3     Cognitive Testing Six-Item Cognitive Screener   "I would like to ask you some questions that ask you to use your memory. I am going to name three objects. Please wait until I say all three words, then repeat them. Remember what they are  because I am going to ask you to name them again in a few minutes. Please repeat these words for  me: APPLE--TABLE--PENNY." (Interviewer may repeat names 3 times if necessary but repetition not scored.)  Did patient correctly repeat all three words? Yes - may proceed with screen  What year is this? Correct What month is this? Correct What day of the week is this? Correct  What were the three objects I asked you to remember? Apple Correct Table Correct Penny Correct  Score one point for each incorrect answer.  A score of 2 or more points warrants additional investigation.  Patient's score 0     Assessment and Plan:    During the course of the visit the patient was educated and counseled about appropriate screening and preventive services as documented in the assessment and plan.  Will send in referral to social work team to discuss options for patient to have ramp installed and discuss transportation services available.  The printed AVS was given to the patient and included an updated screening schedule, a list of risk factors, and personalized health advice.        Hughes Better, RPH-CPP  09/02/2020

## 2020-09-02 NOTE — Patient Instructions (Addendum)
Things That May Be Affecting Your Health:   Alcohol   Hearing loss   Pain   x Depression   Home Safety   Sexual Health  x Diabetes x Lack of physical activity   Stress    Difficulty with daily activities   Loneliness   Tiredness    Drug use   Medicines   Tobacco use    Falls   Motor Vehicle Safety   Weight    Food choices   Oral Health   Other      YOUR PERSONALIZED HEALTH PLAN : 1. Schedule your next subsequent Medicare Wellness visit in one year 2. Attend all of your regular appointments to address your medical issues 3. Complete the preventative screenings and services 4. I sent in a referral regarding a ramp and transportation services      Annual Wellness Visit                       Medicare Covered Preventative Screenings and [place] Men and Women Who How Often Need? Date of Last Service Action  Abdominal Aortic Aneurysm Adults with AAA risk factors Once        Alcohol Misuse and Counseling All Adults Screening once a year if no alcohol misuse. Counseling up to 4 face to face sessions.        Bone Density Measurement Adults at risk for osteoporosis Once every 2 yrs        Lipid Panel Z13.6 All adults without CV disease Once every 5 yrs            Colorectal Cancer Stool sample or Colonoscopy All adults 81 and older   Once every year Every 10 years              Depression All Adults Once a year x Today    Diabetes Screening Blood glucose, post glucose load, or GTT Z13.1 All adults at risk Pre-diabetics Once per year Twice per year x        Diabetes  Self-Management Training All adults Diabetics 10 hrs first year; 2 hours subsequent years. Requires Copay x      Glaucoma Diabetics Family history of glaucoma African Americans 32 yrs + Hispanic Americans 37 yrs + Annually - requires coppay x        Hepatitis C Z72.89 or F19.20 High Risk for HCV Born between 1945 and 1965 Annually Once          HIV Z11.4 All adults based on risk  Annually btw ages 2 & 72 regardless of risk Annually > 65 yrs if at increased risk          Lung Cancer Screening Asymptomatic adults aged 11-77 with 30 pack yr history and current smoker OR quit within the last 15 yrs Annually Must have counseling and shared decision making documentation before first screen          Medical Nutrition Therapy Adults with Diabetes Renal disease Kidney transplant within past 3 yrs 3 hours first year; 2 hours subsequent years        Obesity and Counseling All adults Screening once a year Counseling if BMI 30 or higher   Today    Tobacco Use Counseling Adults who use tobacco Up to 8 visits in one year        Vaccines Z23 Hepatitis B Influenza Pneumonia Adults   Once Once every flu season Two different vaccines separated by one year  Next Annual Wellness Visit People with Medicare Every year   Today        [place] Women Who How Often Need Date of Last Service Action  Mammogram  Z12.31 Women over 71 One baseline ages 38-39. Annually ager 40 yrs+          Pap tests All women Annually if high risk. Every 2 yrs for normal risk women          Screening for cervical cancer with Pap (Z01.419 nl or Z01.411abnl) & HPV Z11.51 Women aged 79 to 106 Once every 5 yrs        Screening pelvic and breast exams All women Annually if high risk. Every 2 yrs for normal risk women        Sexually Transmitted Diseases Chlamydia Gonorrhea Syphilis All at risk adults Annually for non pregnant females at increased risk                [place] Men Who How Ofter Need Date of Last Service Action  Prostate Cancer - DRE & PSA Men over 50 Annually. DRE might require a copay.              Sexually Transmitted Diseases Syphilis All at risk adults Annually for men at increased risk          Health Maintenance List     Health Maintenance  Topic Date Due   COVID-19 Vaccine (1) Never done   OPHTHALMOLOGY EXAM 06/26/2018   MAMMOGRAM  01/28/2021 (Originally 11/21/2018)   COLONOSCOPY (Pts 45-72yrs Insurance coverage will need to be confirmed) 01/28/2021 (Originally 03/23/2018)   TETANUS/TDAP 01/28/2021 (Originally 09/27/2019)   PNA vac Low Risk Adult (1 of 2 - PCV13) 01/28/2021 (Originally 12/16/2013)   HEMOGLOBIN A1C 07/16/2020   INFLUENZA VACCINE 09/12/2020   FOOT EXAM 09/15/2020   LIPID PANEL 10/27/2020   DEXA SCAN Completed   Hepatitis C Screening Completed   HPV VACCINES Aged Out   Fall Prevention in the Home, Adult Falls can cause injuries and can happen to people of all ages. There are many things you can do to make your home safe and to help prevent falls. Ask forhelp when making these changes. What actions can I take to prevent falls? General Instructions Use good lighting in all rooms. Replace any light bulbs that burn out. Turn on the lights in dark areas. Use night-lights. Keep items that you use often in easy-to-reach places. Lower the shelves around your home if needed. Set up your furniture so you have a clear path. Avoid moving your furniture around. Do not have throw rugs or other things on the floor that can make you trip. Avoid walking on wet floors. If any of your floors are uneven, fix them. Add color or contrast paint or tape to clearly mark and help you see: Grab bars or handrails. First and last steps of staircases. Where the edge of each step is. If you use a stepladder: Make sure that it is fully opened. Do not climb a closed stepladder. Make sure the sides of the stepladder are locked in place. Ask someone to hold the stepladder while you use it. Know where your pets are when moving through your home. What can I do in the bathroom?     Keep the floor dry. Clean up any water on the floor right away. Remove soap buildup in the tub or shower. Use nonskid mats or decals on the floor of the tub or shower. Attach  bath mats securely with double-sided, nonslip rug tape. If you need to sit down  in the shower, use a plastic, nonslip stool. Install grab bars by the toilet and in the tub and shower. Do not use towel bars as grab bars. What can I do in the bedroom? Make sure that you have a light by your bed that is easy to reach. Do not use any sheets or blankets for your bed that hang to the floor. Have a firm chair with side arms that you can use for support when you get dressed. What can I do in the kitchen? Clean up any spills right away. If you need to reach something above you, use a step stool with a grab bar. Keep electrical cords out of the way. Do not use floor polish or wax that makes floors slippery. What can I do with my stairs? Do not leave any items on the stairs. Make sure that you have a light switch at the top and the bottom of the stairs. Make sure that there are handrails on both sides of the stairs. Fix handrails that are broken or loose. Install nonslip stair treads on all your stairs. Avoid having throw rugs at the top or bottom of the stairs. Choose a carpet that does not hide the edge of the steps on the stairs. Check carpeting to make sure that it is firmly attached to the stairs. Fix carpet that is loose or worn. What can I do on the outside of my home? Use bright outdoor lighting. Fix the edges of walkways and driveways and fix any cracks. Remove anything that might make you trip as you walk through a door, such as a raised step or threshold. Trim any bushes or trees on paths to your home. Check to see if handrails are loose or broken and that both sides of all steps have handrails. Install guardrails along the edges of any raised decks and porches. Clear paths of anything that can make you trip, such as tools or rocks. Have leaves, snow, or ice cleared regularly. Use sand or salt on paths during winter. Clean up any spills in your garage right away. This includes grease or oil spills. What other actions can I take? Wear shoes that: Have a low heel.  Do not wear high heels. Have rubber bottoms. Feel good on your feet and fit well. Are closed at the toe. Do not wear open-toe sandals. Use tools that help you move around if needed. These include: Canes. Walkers. Scooters. Crutches. Review your medicines with your doctor. Some medicines can make you feel dizzy. This can increase your chance of falling. Ask your doctor what else you can do to help prevent falls. Where to find more information Centers for Disease Control and Prevention, [employer]: http://www.wolf.info/ National Institute on Aging: http://kim-miller.com/ Contact a doctor if: You are afraid of falling at home. You feel weak, drowsy, or dizzy at home. You fall at home. Summary There are many simple things that you can do to make your home safe and to help prevent falls. Ways to make your home safe include removing things that can make you trip and installing grab bars in the bathroom. Ask for help when making these changes in your home. This information is not intended to replace advice given to you by your health care provider. Make sure you discuss any questions you have with your healthcare provider. Document Revised: 09/02/2019 Document Reviewed: 09/02/2019 Elsevier Patient Education  [place]  Maintenance, Female Adopting a healthy lifestyle and getting preventive care are important in promoting health and wellness. Ask your health care provider about: The right schedule for you to have regular tests and exams. Things you can do on your own to prevent diseases and keep yourself healthy. What should I know about diet, weight, and exercise? Eat a healthy diet  Eat a diet that includes plenty of vegetables, fruits, low-fat dairy products, and lean protein. Do not eat a lot of foods that are high in solid fats, added sugars, or sodium.  Maintain a healthy weight Body mass index (BMI) is used to identify weight problems. It estimates body fat based on height and weight.  Your health care provider can help determineyour BMI and help you achieve or maintain a healthy weight. Get regular exercise Get regular exercise. This is one of the most important things you can do for your health. Most adults should: Exercise for at least 150 minutes each week. The exercise should increase your heart rate and make you sweat (moderate-intensity exercise). Do strengthening exercises at least twice a week. This is in addition to the moderate-intensity exercise. Spend less time sitting. Even light physical activity can be beneficial. Watch cholesterol and blood lipids Have your blood tested for lipids and cholesterol at 72 years of age, then havethis test every 5 years. Have your cholesterol levels checked more often if: Your lipid or cholesterol levels are high. You are older than 72 years of age. You are at high risk for heart disease. What should I know about cancer screening? Depending on your health history and family history, you may need to have cancer screening at various ages. This may include screening for: Breast cancer. Cervical cancer. Colorectal cancer. Skin cancer. Lung cancer. What should I know about heart disease, diabetes, and high blood pressure? Blood pressure and heart disease High blood pressure causes heart disease and increases the risk of stroke. This is more likely to develop in people who have high blood pressure readings, are of African descent, or are overweight. Have your blood pressure checked: Every 3-5 years if you are 69-32 years of age. Every year if you are 76 years old or older. Diabetes Have regular diabetes screenings. This checks your fasting blood sugar level. Have the screening done: Once every three years after age 51 if you are at a normal weight and have a low risk for diabetes. More often and at a younger age if you are overweight or have a high risk for diabetes. What should I know about preventing infection? Hepatitis B If  you have a higher risk for hepatitis B, you should be screened for this virus. Talk with your health care provider to find out if you are at risk forhepatitis B infection. Hepatitis C Testing is recommended for: Everyone born from [birth year] through 1965. Anyone with known risk factors for hepatitis C. Sexually transmitted infections (STIs) Get screened for STIs, including gonorrhea and chlamydia, if: You are sexually active and are younger than 72 years of age. You are older than 72 years of age and your health care provider tells you that you are at risk for this type of infection. Your sexual activity has changed since you were last screened, and you are at increased risk for chlamydia or gonorrhea. Ask your health care provider if you are at risk. Ask your health care provider about whether you are at high risk for HIV. Your health care provider may recommend a prescription medicine to help  prevent HIV infection. If you choose to take medicine to prevent HIV, you should first get tested for HIV. You should then be tested every 3 months for as long as you are taking the medicine. Pregnancy If you are about to stop having your period (premenopausal) and you may become pregnant, seek counseling before you get pregnant. Take 400 to 800 micrograms (mcg) of folic acid every day if you become pregnant. Ask for birth control (contraception) if you want to prevent pregnancy. Osteoporosis and menopause Osteoporosis is a disease in which the bones lose minerals and strength with aging. This can result in bone fractures. If you are 16 years old or older, or if you are at risk for osteoporosis and fractures, ask your health care provider if you should: Be screened for bone loss. Take a calcium or vitamin D supplement to lower your risk of fractures. Be given hormone replacement therapy (HRT) to treat symptoms of menopause. Follow these instructions at home: Lifestyle Do not use any products that contain  nicotine or tobacco, such as cigarettes, e-cigarettes, and chewing tobacco. If you need help quitting, ask your health care provider. Do not use street drugs. Do not share needles. Ask your health care provider for help if you need support or information about quitting drugs. Alcohol use Do not drink alcohol if: Your health care provider tells you not to drink. You are pregnant, may be pregnant, or are planning to become pregnant. If you drink alcohol: Limit how much you use to 0-1 drink a day. Limit intake if you are breastfeeding. Be aware of how much alcohol is in your drink. In the [place] one drink equals one 12 oz bottle of beer (355 mL), one 5 oz glass of wine (148 mL), or one 1 oz glass of hard liquor (44 mL). General instructions Schedule regular health, dental, and eye exams. Stay current with your vaccines. Tell your health care provider if: You often feel depressed. You have ever been abused or do not feel safe at home. Summary Adopting a healthy lifestyle and getting preventive care are important in promoting health and wellness. Follow your health care provider's instructions about healthy diet, exercising, and getting tested or screened for diseases. Follow your health care provider's instructions on monitoring your cholesterol and blood pressure. This information is not intended to replace advice given to you by your health care provider. Make sure you discuss any questions you have with your healthcare provider. Document Revised: 01/22/2018 Document Reviewed: 01/22/2018 Elsevier Patient Education  2022 [employer].

## 2020-09-13 NOTE — Progress Notes (Signed)
I discussed the AWV findings with the provider who conducted the visit. I was present in the office suite and immediately available to provide assistance and direction throughout the time the service was provided.  ?

## 2020-09-16 ENCOUNTER — Telehealth: Payer: Self-pay

## 2020-09-16 NOTE — Telephone Encounter (Signed)
   Telephone encounter was:  Successful.  09/16/2020 Name: Chelsea [name] MRN: [id number] DOB: 1950/04/27  Chelsea [name] is a 70 y.o. year old female who is a primary care patient of Christian, Lucinda Dell, MD . The community resource team was consulted for assistance with Transportation Needs  and ramp.  Care guide performed the following interventions: Spoke with patient gave her contact information for [facility].  Also gave her information for [place] for ramp.  Follow Up Plan:  Care guide will follow up with patient by phone over the next 7 days.  Aryeh Butterfield, AAS Paralegal, [facility] [employer]  [address], [place] [address] ??[email or web address]  ?? [phone]   [email or web address]

## 2020-09-27 ENCOUNTER — Telehealth: Payer: Self-pay

## 2020-09-27 NOTE — Telephone Encounter (Signed)
   Telephone encounter was:  Unsuccessful.  09/27/2020 Name: Chelsea [name] MRN: [id number] DOB: 23-Dec-1950  Unsuccessful outbound call made today to assist with:  Transportation Needs  and ramp.  Outreach Attempt:  2nd Attempt  A HIPAA compliant voice message was left requesting a return call.  Instructed patient to call back at [phone].  Kadeshia Kasparian, AAS Paralegal, [facility] [employer]  [address], [place] [address] ??[email or web address]  ?? [phone]   [email or web address]

## 2020-09-28 ENCOUNTER — Telehealth: Payer: Self-pay

## 2020-09-28 NOTE — Telephone Encounter (Signed)
   Telephone encounter was:  Successful.  09/28/2020 Name: Chelsea [name] MRN: [id number] DOB: 08/19/1950  Chelsea [name] is a 69 y.o. year old female who is a primary care patient of Christian, Lucinda Dell, MD . The community resource team was consulted for assistance with Transportation Needs  and ramp.  Care guide performed the following interventions: Spoke with patient she has spoken to [place].  Her daughter is going to assist her with completing the paperwork.    Follow Up Plan:  No further follow up planned at this time. The patient has been provided with needed resources.  Ireta Pullman, AAS Paralegal, [facility] [employer]  [address], [place] [address] ??[email or web address]  ?? [phone]   [email or web address]

## 2020-10-03 ENCOUNTER — Other Ambulatory Visit: Payer: Self-pay | Admitting: Internal Medicine

## 2020-10-03 DIAGNOSIS — F329 Major depressive disorder, single episode, unspecified: Secondary | ICD-10-CM

## 2020-10-04 ENCOUNTER — Other Ambulatory Visit: Payer: Self-pay | Admitting: Internal Medicine

## 2020-10-04 DIAGNOSIS — I152 Hypertension secondary to endocrine disorders: Secondary | ICD-10-CM

## 2020-10-04 DIAGNOSIS — E113593 Type 2 diabetes mellitus with proliferative diabetic retinopathy without macular edema, bilateral: Secondary | ICD-10-CM

## 2020-10-04 DIAGNOSIS — E1159 Type 2 diabetes mellitus with other circulatory complications: Secondary | ICD-10-CM

## 2020-10-04 DIAGNOSIS — G4700 Insomnia, unspecified: Secondary | ICD-10-CM

## 2020-10-04 DIAGNOSIS — Z794 Long term (current) use of insulin: Secondary | ICD-10-CM

## 2020-10-11 NOTE — Progress Notes (Signed)
This AWV as documented by pharmacist Dr. Georgina Peer and attested by Dr. Charleen Kirks is reviewed and approved.

## 2020-10-14 ENCOUNTER — Other Ambulatory Visit: Payer: Self-pay | Admitting: Internal Medicine

## 2020-10-14 DIAGNOSIS — Z794 Long term (current) use of insulin: Secondary | ICD-10-CM

## 2020-10-14 MED ORDER — ACCU-CHEK GUIDE VI STRP: ORAL_STRIP | 3 refills | Status: DC

## 2020-10-14 NOTE — Telephone Encounter (Signed)
MED REFILL REQUEST  glucose blood (ACCU-CHEK GUIDE) test strip   [facility] [place], [place] - [address] Phone:  [phone]  Fax:  [phone]

## 2020-10-18 ENCOUNTER — Encounter: Payer: Medicare Other | Admitting: Internal Medicine

## 2020-10-20 ENCOUNTER — Encounter: Payer: Medicare Other | Admitting: Internal Medicine

## 2020-10-21 ENCOUNTER — Telehealth: Payer: Self-pay

## 2020-10-21 NOTE — Telephone Encounter (Signed)
Requesting to speak with a nurse about getting help during the day for PCS.

## 2020-10-24 NOTE — Telephone Encounter (Signed)
Spoke with daughter [name] -[phone], regarding PCS for her mother in the morning. According to daughter Ms. Muhlenkamp is getting care in the evening (last form dates 2016). Patient has appointment Wednesday. Daughter to find out who is providing the PCS. Needs to bring form from them when come in for office visit.

## 2020-10-26 ENCOUNTER — Ambulatory Visit (INDEPENDENT_AMBULATORY_CARE_PROVIDER_SITE_OTHER): Payer: Medicare Other | Admitting: Internal Medicine

## 2020-10-26 VITALS — BP 125/69 | HR 82 | Temp 98.0°F | Ht 64.0 in | Wt 167.7 lb

## 2020-10-26 DIAGNOSIS — Z0000 Encounter for general adult medical examination without abnormal findings: Secondary | ICD-10-CM | POA: Diagnosis not present

## 2020-10-26 DIAGNOSIS — E1169 Type 2 diabetes mellitus with other specified complication: Secondary | ICD-10-CM | POA: Diagnosis not present

## 2020-10-26 DIAGNOSIS — F329 Major depressive disorder, single episode, unspecified: Secondary | ICD-10-CM | POA: Diagnosis not present

## 2020-10-26 DIAGNOSIS — E1159 Type 2 diabetes mellitus with other circulatory complications: Secondary | ICD-10-CM | POA: Diagnosis not present

## 2020-10-26 DIAGNOSIS — N1832 Chronic kidney disease, stage 3b: Secondary | ICD-10-CM | POA: Diagnosis not present

## 2020-10-26 DIAGNOSIS — E1122 Type 2 diabetes mellitus with diabetic chronic kidney disease: Secondary | ICD-10-CM

## 2020-10-26 DIAGNOSIS — E113593 Type 2 diabetes mellitus with proliferative diabetic retinopathy without macular edema, bilateral: Secondary | ICD-10-CM

## 2020-10-26 DIAGNOSIS — D649 Anemia, unspecified: Secondary | ICD-10-CM

## 2020-10-26 DIAGNOSIS — D638 Anemia in other chronic diseases classified elsewhere: Secondary | ICD-10-CM | POA: Diagnosis not present

## 2020-10-26 DIAGNOSIS — G4700 Insomnia, unspecified: Secondary | ICD-10-CM | POA: Diagnosis not present

## 2020-10-26 DIAGNOSIS — N183 Chronic kidney disease, stage 3 unspecified: Secondary | ICD-10-CM

## 2020-10-26 DIAGNOSIS — Z6828 Body mass index (BMI) 28.0-28.9, adult: Secondary | ICD-10-CM

## 2020-10-26 DIAGNOSIS — F32A Depression, unspecified: Secondary | ICD-10-CM

## 2020-10-26 DIAGNOSIS — E11319 Type 2 diabetes mellitus with unspecified diabetic retinopathy without macular edema: Secondary | ICD-10-CM

## 2020-10-26 DIAGNOSIS — Z794 Long term (current) use of insulin: Secondary | ICD-10-CM

## 2020-10-26 DIAGNOSIS — I152 Hypertension secondary to endocrine disorders: Secondary | ICD-10-CM

## 2020-10-26 DIAGNOSIS — Z8673 Personal history of transient ischemic attack (TIA), and cerebral infarction without residual deficits: Secondary | ICD-10-CM | POA: Diagnosis not present

## 2020-10-26 DIAGNOSIS — E785 Hyperlipidemia, unspecified: Secondary | ICD-10-CM | POA: Diagnosis not present

## 2020-10-26 LAB — POCT GLYCOSYLATED HEMOGLOBIN (HGB A1C): Hemoglobin A1C: 5.7 % — AB (ref 4.0–5.6)

## 2020-10-26 LAB — GLUCOSE, CAPILLARY: Glucose-Capillary: 111 mg/dL — ABNORMAL HIGH (ref 70–99)

## 2020-10-26 MED ORDER — ATORVASTATIN CALCIUM 80 MG PO TABS: 80.0000 mg | ORAL_TABLET | Freq: Every day | ORAL | 3 refills | Status: DC

## 2020-10-26 MED ORDER — ASPIRIN 81 MG PO CAPS: 81.0000 mg | ORAL_CAPSULE | Freq: Every day | ORAL | 1 refills | Status: DC

## 2020-10-26 MED ORDER — TRAZODONE HCL 50 MG PO TABS: ORAL_TABLET | ORAL | 1 refills | Status: DC

## 2020-10-26 MED ORDER — CITALOPRAM HYDROBROMIDE 20 MG PO TABS: ORAL_TABLET | ORAL | 1 refills | Status: DC

## 2020-10-26 MED ORDER — VICTOZA 18 MG/3ML ~~LOC~~ SOPN: 1.2000 mg | PEN_INJECTOR | Freq: Every day | SUBCUTANEOUS | 1 refills | Status: DC

## 2020-10-26 MED ORDER — SPIRONOLACTONE 50 MG PO TABS: 50.0000 mg | ORAL_TABLET | Freq: Every day | ORAL | 1 refills | Status: DC

## 2020-10-26 MED ORDER — LOSARTAN POTASSIUM-HCTZ 100-25 MG PO TABS: 1.0000 | ORAL_TABLET | Freq: Every day | ORAL | 1 refills | Status: DC

## 2020-10-26 MED ORDER — AMLODIPINE BESYLATE 10 MG PO TABS: 10.0000 mg | ORAL_TABLET | Freq: Every day | ORAL | 3 refills | Status: DC

## 2020-10-27 ENCOUNTER — Encounter: Payer: Self-pay | Admitting: Internal Medicine

## 2020-10-27 LAB — CBC WITH DIFFERENTIAL/PLATELET
Basophils Absolute: 0.1 10*3/uL (ref 0.0–0.2)
Basos: 1 %
EOS (ABSOLUTE): 0.5 10*3/uL — ABNORMAL HIGH (ref 0.0–0.4)
Eos: 7 %
Hematocrit: 34.3 % (ref 34.0–46.6)
Hemoglobin: 11.2 g/dL (ref 11.1–15.9)
Immature Grans (Abs): 0 10*3/uL (ref 0.0–0.1)
Immature Granulocytes: 0 %
Lymphocytes Absolute: 2 10*3/uL (ref 0.7–3.1)
Lymphs: 27 %
MCH: 29.4 pg (ref 26.6–33.0)
MCHC: 32.7 g/dL (ref 31.5–35.7)
MCV: 90 fL (ref 79–97)
Monocytes Absolute: 0.4 10*3/uL (ref 0.1–0.9)
Monocytes: 6 %
Neutrophils Absolute: 4.3 10*3/uL (ref 1.4–7.0)
Neutrophils: 59 %
Platelets: 271 10*3/uL (ref 150–450)
RBC: 3.81 x10E6/uL (ref 3.77–5.28)
RDW: 12.7 % (ref 11.7–15.4)
WBC: 7.2 10*3/uL (ref 3.4–10.8)

## 2020-10-27 LAB — LIPID PANEL
Chol/HDL Ratio: 2.6 ratio (ref 0.0–4.4)
Cholesterol, Total: 118 mg/dL (ref 100–199)
HDL: 46 mg/dL (ref >39)
LDL Chol Calc (NIH): 57 mg/dL (ref 0–99)
Triglycerides: 72 mg/dL (ref 0–149)
VLDL Cholesterol Cal: 15 mg/dL (ref 5–40)

## 2020-10-27 LAB — IRON,TIBC AND FERRITIN PANEL
Ferritin: 188 ng/mL — ABNORMAL HIGH (ref 15–150)
Iron Saturation: 35 % (ref 15–55)
Iron: 80 ug/dL (ref 27–139)
Total Iron Binding Capacity: 231 ug/dL — ABNORMAL LOW (ref 250–450)
UIBC: 151 ug/dL (ref 118–369)

## 2020-10-27 LAB — BASIC METABOLIC PANEL
BUN/Creatinine Ratio: 22 (ref 12–28)
BUN: 35 mg/dL — ABNORMAL HIGH (ref 8–27)
CO2: 16 mmol/L — ABNORMAL LOW (ref 20–29)
Calcium: 9.5 mg/dL (ref 8.7–10.3)
Chloride: 107 mmol/L — ABNORMAL HIGH (ref 96–106)
Creatinine, Ser: 1.62 mg/dL — ABNORMAL HIGH (ref 0.57–1.00)
Glucose: 116 mg/dL — ABNORMAL HIGH (ref 65–99)
Potassium: 4.9 mmol/L (ref 3.5–5.2)
Sodium: 142 mmol/L (ref 134–144)
eGFR: 34 mL/min/{1.73_m2} — ABNORMAL LOW (ref >59)

## 2020-10-27 NOTE — Assessment & Plan Note (Signed)
Hemoglobin stable.  Iron panel: ferritin 188, iron 80, TIBC 231. Continue to monitor hemoglobin annually.

## 2020-10-27 NOTE — Assessment & Plan Note (Signed)
LDL at goal--57 Continue lipitor 80mg 

## 2020-10-27 NOTE — Assessment & Plan Note (Addendum)
Chronic and stable. Currently on metformin XR 500mg  daily and victoza 1.2mg  daily A1C remains at goal at 5.7. suspect improvement from significant weight loss through dietary changes over the past year Foot exam performed at today's visit--normal aside from long toenails Still has not gone for retinopathy screening--continue to encourage  Will d/c metformin to reduce pill burden since A1C as been largely below goal recently.  Repeat A1C at follow up in 3 months  She will have podiatry cut her toenails  Next foot exam due 10/2021

## 2020-10-27 NOTE — Assessment & Plan Note (Signed)
Chronic and stable. Blood pressure is at goal in the office today.  BMP today with borderline elevated K 4.9 in the setting of CKD 3b. Will need to monitor closely.   Continue losartan-hctz 100-25mg  daily, amlodipine 10mg  daily and spironolactone 50mg  daily  Follow up 3 months with labs

## 2020-10-27 NOTE — Progress Notes (Signed)
Office Visit   [name], female    DOB: 05/25/1950, 70 y.o.   MRN: [id number]  Subjective:   70 y.o. presents today for a 3 month recheck of hypertension, diabetes, depression, obesity. Please refer to problem based charting for details of assessment and plan.  ACTIVE MEDICATIONS   Outpatient Medications Prior to Visit  Medication Sig Dispense Refill   atorvastatin (LIPITOR) 80 MG tablet Take 1 tablet (80 mg total) by mouth daily. 90 tablet 3   Accu-Chek Softclix Lancets lancets Use to check blood sugar 2 times per day. 100 each 3   albuterol (VENTOLIN HFA) 108 (90 Base) MCG/ACT inhaler INHALE 2 PUFFS into lungs EVERY 6 HOURS AS NEEDED FOR WHEEZING OR SHORTNESS OF BREATH (Patient not taking: Reported on 09/02/2020) 36 g 3   Blood Glucose Monitoring Suppl (ACCU-CHEK GUIDE ME) w/Device KIT Use to check blood sugar one time per day. 1 kit 0   fluticasone (FLONASE) 50 MCG/ACT nasal spray USE 2 SPRAYS IN EACH NOSTRIL DAILY 32 g 1   glucose blood (ACCU-CHEK GUIDE) test strip Use to check blood sugar two times per day. 100 each 3   Incontinence Supply Disposable (BLADDER CONTROL PAD REGULAR) MISC Use daily as directed 100 each 11   Insulin Pen Needle (PEN NEEDLES) 32G X 4 MM MISC Use once daily to inject victoza 100 each 3   Lancets Misc. (ACCU-CHEK FASTCLIX LANCET) KIT Use to check blood sugar twice a day as discussed. 1 kit 1   amLODipine (NORVASC) 10 MG tablet Take 1 tablet (10 mg total) by mouth daily. 90 tablet 3   Aspirin 81 MG CAPS Take by mouth.     citalopram (CELEXA) 20 MG tablet TAKE 1 TABLET BY MOUTH NIGHTLY AT BEDTIME 90 tablet 1   liraglutide (VICTOZA) 18 MG/3ML SOPN Inject 1.2 mg into the skin daily at 6 (six) AM. 12 mL 1   losartan-hydrochlorothiazide (HYZAAR) 100-25 MG tablet Take 1 tablet by mouth daily. 90 tablet 1   metFORMIN (GLUCOPHAGE-XR) 500 MG 24 hr tablet TAKE 1 TABLET BY MOUTH EVERY DAY WITH BREAKFAST 90 tablet 0   spironolactone (ALDACTONE) 50 MG  tablet Take 1 tablet (50 mg total) by mouth daily. 90 tablet 1   traZODone (DESYREL) 50 MG tablet TAKE 1/2 TO 1 TABLET BY MOUTH NIGHTLY AT BEDTIME 90 tablet 0   No facility-administered medications prior to visit.     Objective:   BP 125/69 (BP Location: Right Arm, Patient Position: Sitting, Cuff Size: Small)   Pulse 82   Temp 98 F (36.7 C) (Oral)   Ht _0  (1.626 m)   Wt 167 lb 11.2 oz (76.1 kg)   SpO2 100%   BMI 28.79 kg/m  Wt Readings from Last 3 Encounters:  10/26/20 167 lb 11.2 oz (76.1 kg)  09/02/20 169 lb (76.7 kg)  08/01/20 169 lb 1.6 oz (76.7 kg)   BP Readings from Last 3 Encounters:  10/26/20 125/69  08/01/20 (!) 126/47  04/15/20 137/74   Physical exam General: chronically ill appearing, sitting in wheelchair Cardiac: RRR Pulm: lungs clear Psych: normal affect  Health Maintenance:   Health Maintenance  Topic Date Due   COVID-19 Vaccine (1) Never done   Zoster Vaccines- Shingrix (1 of 2) Never done   OPHTHALMOLOGY EXAM  06/26/2018   INFLUENZA VACCINE  Never done   FOOT EXAM  09/15/2020   MAMMOGRAM  01/28/2021 (Originally 11/21/2018)   COLONOSCOPY (Pts 45-4yr Insurance coverage will need to be  confirmed)  01/28/2021 (Originally 03/23/2018)   TETANUS/TDAP  01/28/2021 (Originally 09/27/2019)   PNA vac Low Risk Adult (1 of 2 - PCV13) 01/28/2021 (Originally 12/16/2013)   HEMOGLOBIN A1C  01/25/2021   LIPID PANEL  10/26/2021   DEXA SCAN  Completed   Hepatitis C Screening  Completed   HPV VACCINES  Aged Out     Assessment & Plan:   Problem List Items Addressed This Visit       Cardiovascular and Mediastinum   Hypertension associated with diabetes (Chelsea [name]) (Chronic)   Relevant Medications   spironolactone (ALDACTONE) 50 MG tablet   losartan-hydrochlorothiazide (HYZAAR) 100-25 MG tablet   liraglutide (VICTOZA) 18 MG/3ML SOPN   atorvastatin (LIPITOR) 80 MG tablet   amLODipine (NORVASC) 10 MG tablet   Aspirin 81 MG CAPS   Other Relevant Orders   Basic  metabolic panel (Completed)   CBC with Diff (Completed)     Endocrine   Type 2 diabetes mellitus with diabetic retinopathy (HCC) - Primary (Chronic)   Relevant Medications   losartan-hydrochlorothiazide (HYZAAR) 100-25 MG tablet   liraglutide (VICTOZA) 18 MG/3ML SOPN   atorvastatin (LIPITOR) 80 MG tablet   Aspirin 81 MG CAPS   Other Relevant Orders   POC Hbg A1C (Completed)   Hyperlipidemia associated with type 2 diabetes mellitus (HCC) (Chronic)   Relevant Medications   losartan-hydrochlorothiazide (HYZAAR) 100-25 MG tablet   liraglutide (VICTOZA) 18 MG/3ML SOPN   atorvastatin (LIPITOR) 80 MG tablet   Aspirin 81 MG CAPS   Other Relevant Orders   Lipid panel (Completed)     Other   Severe obesity (BMI >= 40) (HCC) (Chronic)   Relevant Medications   liraglutide (VICTOZA) 18 MG/3ML SOPN   Depression (Chronic)   Relevant Medications   traZODone (DESYREL) 50 MG tablet   citalopram (CELEXA) 20 MG tablet   History of stroke   Relevant Medications   atorvastatin (LIPITOR) 80 MG tablet   Aspirin 81 MG CAPS   Other Relevant Orders   Ambulatory referral to Home Health   Anemia   Relevant Orders   Iron, TIBC and Ferritin Panel (Completed)   Other Visit Diagnoses     Insomnia, unspecified type       Relevant Medications   traZODone (DESYREL) 50 MG tablet      Follow up in 3 months with labs: BMP, A1C   Pt discussed with Dr. Forde Radon, MD Internal Medicine Resident PGY-2 [facility] Pager: [phone] 10/27/2020 7:20 AM

## 2020-10-27 NOTE — Assessment & Plan Note (Addendum)
Chronic and stable. Chronic AGMA likely attributable to renal dysfunction.

## 2020-10-27 NOTE — Assessment & Plan Note (Signed)
Declines vaccinations at today's visit.

## 2020-10-27 NOTE — Assessment & Plan Note (Signed)
HH PT/OT ordered in hopes of improving functional status.

## 2020-11-07 DIAGNOSIS — E785 Hyperlipidemia, unspecified: Secondary | ICD-10-CM | POA: Diagnosis not present

## 2020-11-07 DIAGNOSIS — N1832 Chronic kidney disease, stage 3b: Secondary | ICD-10-CM | POA: Diagnosis not present

## 2020-11-07 DIAGNOSIS — E1169 Type 2 diabetes mellitus with other specified complication: Secondary | ICD-10-CM | POA: Diagnosis not present

## 2020-11-07 DIAGNOSIS — Z7951 Long term (current) use of inhaled steroids: Secondary | ICD-10-CM | POA: Diagnosis not present

## 2020-11-07 DIAGNOSIS — Z8673 Personal history of transient ischemic attack (TIA), and cerebral infarction without residual deficits: Secondary | ICD-10-CM | POA: Diagnosis not present

## 2020-11-07 DIAGNOSIS — D631 Anemia in chronic kidney disease: Secondary | ICD-10-CM | POA: Diagnosis not present

## 2020-11-07 DIAGNOSIS — E1122 Type 2 diabetes mellitus with diabetic chronic kidney disease: Secondary | ICD-10-CM | POA: Diagnosis not present

## 2020-11-07 DIAGNOSIS — Z9181 History of falling: Secondary | ICD-10-CM | POA: Diagnosis not present

## 2020-11-07 DIAGNOSIS — F329 Major depressive disorder, single episode, unspecified: Secondary | ICD-10-CM | POA: Diagnosis not present

## 2020-11-07 DIAGNOSIS — Z7982 Long term (current) use of aspirin: Secondary | ICD-10-CM | POA: Diagnosis not present

## 2020-11-07 DIAGNOSIS — Z79899 Other long term (current) drug therapy: Secondary | ICD-10-CM | POA: Diagnosis not present

## 2020-11-07 DIAGNOSIS — I129 Hypertensive chronic kidney disease with stage 1 through stage 4 chronic kidney disease, or unspecified chronic kidney disease: Secondary | ICD-10-CM | POA: Diagnosis not present

## 2020-11-07 DIAGNOSIS — G4700 Insomnia, unspecified: Secondary | ICD-10-CM | POA: Diagnosis not present

## 2020-11-07 DIAGNOSIS — E113593 Type 2 diabetes mellitus with proliferative diabetic retinopathy without macular edema, bilateral: Secondary | ICD-10-CM | POA: Diagnosis not present

## 2020-11-08 ENCOUNTER — Telehealth: Payer: Self-pay | Admitting: Internal Medicine

## 2020-11-08 ENCOUNTER — Other Ambulatory Visit: Payer: Self-pay | Admitting: Internal Medicine

## 2020-11-08 NOTE — Telephone Encounter (Signed)
[facility] OT requesting call back for VO.  Please call back.

## 2020-11-08 NOTE — Telephone Encounter (Signed)
Return call to [place], [place] with [facility] - requesting verbal order for "OT twice a week x 8 weeks". VO given - if not appropriate, let me know. Thanks

## 2020-11-10 ENCOUNTER — Other Ambulatory Visit: Payer: Self-pay | Admitting: Internal Medicine

## 2020-11-10 DIAGNOSIS — Z794 Long term (current) use of insulin: Secondary | ICD-10-CM

## 2020-11-10 DIAGNOSIS — E113593 Type 2 diabetes mellitus with proliferative diabetic retinopathy without macular edema, bilateral: Secondary | ICD-10-CM

## 2020-11-30 ENCOUNTER — Telehealth: Payer: Self-pay | Admitting: *Deleted

## 2020-11-30 NOTE — Telephone Encounter (Signed)
Received call from [facility] PT with [facility] requesting verbal orders to "Continue care once a week x 1, twice a week x 3, then once a week x 5". Stated pt is progressing well. VO given - if not appropriate, let me know.

## 2020-12-01 NOTE — Telephone Encounter (Signed)
Agree 

## 2020-12-07 DIAGNOSIS — E785 Hyperlipidemia, unspecified: Secondary | ICD-10-CM | POA: Diagnosis not present

## 2020-12-07 DIAGNOSIS — E113593 Type 2 diabetes mellitus with proliferative diabetic retinopathy without macular edema, bilateral: Secondary | ICD-10-CM | POA: Diagnosis not present

## 2020-12-07 DIAGNOSIS — Z7951 Long term (current) use of inhaled steroids: Secondary | ICD-10-CM | POA: Diagnosis not present

## 2020-12-07 DIAGNOSIS — Z9181 History of falling: Secondary | ICD-10-CM | POA: Diagnosis not present

## 2020-12-07 DIAGNOSIS — Z79899 Other long term (current) drug therapy: Secondary | ICD-10-CM | POA: Diagnosis not present

## 2020-12-07 DIAGNOSIS — E1122 Type 2 diabetes mellitus with diabetic chronic kidney disease: Secondary | ICD-10-CM | POA: Diagnosis not present

## 2020-12-07 DIAGNOSIS — D631 Anemia in chronic kidney disease: Secondary | ICD-10-CM | POA: Diagnosis not present

## 2020-12-07 DIAGNOSIS — G4700 Insomnia, unspecified: Secondary | ICD-10-CM | POA: Diagnosis not present

## 2020-12-07 DIAGNOSIS — F329 Major depressive disorder, single episode, unspecified: Secondary | ICD-10-CM | POA: Diagnosis not present

## 2020-12-07 DIAGNOSIS — Z8673 Personal history of transient ischemic attack (TIA), and cerebral infarction without residual deficits: Secondary | ICD-10-CM | POA: Diagnosis not present

## 2020-12-07 DIAGNOSIS — Z7982 Long term (current) use of aspirin: Secondary | ICD-10-CM | POA: Diagnosis not present

## 2020-12-07 DIAGNOSIS — I129 Hypertensive chronic kidney disease with stage 1 through stage 4 chronic kidney disease, or unspecified chronic kidney disease: Secondary | ICD-10-CM | POA: Diagnosis not present

## 2020-12-07 DIAGNOSIS — N1832 Chronic kidney disease, stage 3b: Secondary | ICD-10-CM | POA: Diagnosis not present

## 2020-12-07 DIAGNOSIS — E1169 Type 2 diabetes mellitus with other specified complication: Secondary | ICD-10-CM | POA: Diagnosis not present

## 2020-12-20 ENCOUNTER — Encounter: Payer: Self-pay | Admitting: Podiatry

## 2020-12-20 ENCOUNTER — Other Ambulatory Visit: Payer: Self-pay

## 2020-12-20 ENCOUNTER — Ambulatory Visit (INDEPENDENT_AMBULATORY_CARE_PROVIDER_SITE_OTHER): Payer: Medicare Other | Admitting: Podiatry

## 2020-12-20 DIAGNOSIS — Q828 Other specified congenital malformations of skin: Secondary | ICD-10-CM

## 2020-12-20 DIAGNOSIS — M79674 Pain in right toe(s): Secondary | ICD-10-CM

## 2020-12-20 DIAGNOSIS — E118 Type 2 diabetes mellitus with unspecified complications: Secondary | ICD-10-CM

## 2020-12-20 DIAGNOSIS — M79675 Pain in left toe(s): Secondary | ICD-10-CM

## 2020-12-20 DIAGNOSIS — B351 Tinea unguium: Secondary | ICD-10-CM | POA: Diagnosis not present

## 2020-12-20 NOTE — Progress Notes (Signed)
This patient returns to my office for at risk foot care.  This patient requires this care by a professional since this patient will be at risk due to having CVA chronic kidney disease and diabetes.  This patient is unable to cut nails herself since the patient cannot reach her nails.These nails are painful walking and wearing shoes.   This patient presents for at risk foot care today.  General Appearance  Alert, conversant and in no acute stress.  Vascular  Dorsalis pedis and posterior tibial  pulses are weakly  palpable  bilaterally.  Capillary return is within normal limits  bilaterally. Temperature is within normal limits  bilaterally.  Neurologic  Senn-Weinstein monofilament wire test diminished  bilaterally. Muscle power within normal limits bilaterally.  Nails Thick disfigured discolored nails with subungual debris  from hallux to fifth toes bilaterally. No evidence of bacterial infection or drainage bilaterally.  Orthopedic  No limitations of motion  feet .  No crepitus or effusions noted.  No bony pathology or digital deformities noted.  DJD 1st MPJ  B/L.   Skin  normotropic skin with  porokeratosis  under 1st MPJ  B/L.noted bilaterally.  No signs of infections or ulcers noted.     Onychomycosis  Pain in right toes  Pain in left toes  Consent was obtained for treatment procedures.   Mechanical debridement of nails 1-5  bilaterally performed with a nail nipper.  Filed with dremel without incident.  Patient qualifies for diabetic shoes due to DPN and hallux limitus  1st MPJ  B/L.    Return office visit   3 months                   Told patient to return for periodic foot care and evaluation due to potential at risk complications.   Gardiner Barefoot DPM .

## 2020-12-21 ENCOUNTER — Encounter: Payer: Medicare Other | Admitting: Physical Medicine & Rehabilitation

## 2021-01-02 ENCOUNTER — Other Ambulatory Visit: Payer: Self-pay | Admitting: Internal Medicine

## 2021-01-02 DIAGNOSIS — E1159 Type 2 diabetes mellitus with other circulatory complications: Secondary | ICD-10-CM

## 2021-01-02 DIAGNOSIS — Z794 Long term (current) use of insulin: Secondary | ICD-10-CM

## 2021-01-02 DIAGNOSIS — I152 Hypertension secondary to endocrine disorders: Secondary | ICD-10-CM

## 2021-01-06 ENCOUNTER — Other Ambulatory Visit: Payer: Self-pay | Admitting: Internal Medicine

## 2021-01-06 DIAGNOSIS — Z794 Long term (current) use of insulin: Secondary | ICD-10-CM

## 2021-01-06 DIAGNOSIS — E113593 Type 2 diabetes mellitus with proliferative diabetic retinopathy without macular edema, bilateral: Secondary | ICD-10-CM

## 2021-02-11 NOTE — Assessment & Plan Note (Addendum)
Goals met with [place] PT, discharged 11/23. She notes that her functional status had somewhat improved however she continues to struggle with weakness and stair mobility. Stair mobility is a limiting factor in her ability to safely leave her home. She would benefit from RN care for assistance with medications -Referral to [facility] PT, OT, RN

## 2021-02-11 NOTE — Progress Notes (Addendum)
Office Visit   [name], female    DOB: [birth year], 70 y.o.   MRN: [id number]  Subjective:   70 y.o. presents today for a 3 month recheck of hypertension, diabetes, depression, obesity. Please refer to problem based charting for details of assessment and plan.  ACTIVE MEDICATIONS   Outpatient Medications Prior to Visit  Medication Sig Dispense Refill   Accu-Chek Softclix Lancets lancets Use to check blood sugar 2 times per day. 100 each 3   Blood Glucose Monitoring Suppl (ACCU-CHEK GUIDE ME) w/Device KIT Use to check blood sugar one time per day. 1 kit 0   glucose blood (ACCU-CHEK GUIDE) test strip Use to check blood sugar two times per day. 100 each 3   Lancets Misc. (ACCU-CHEK FASTCLIX LANCET) KIT Use to check blood sugar twice a day as discussed. 1 kit 1   SURE COMFORT PEN NEEDLES 32G X 4 MM MISC Use once daily to inject victoza 100 each 3   albuterol (VENTOLIN HFA) 108 (90 Base) MCG/ACT inhaler INHALE 2 PUFFS into lungs EVERY 6 HOURS AS NEEDED FOR WHEEZING OR SHORTNESS OF BREATH (Patient not taking: Reported on 09/02/2020) 36 g 3   amLODipine (NORVASC) 10 MG tablet Take 1 tablet (10 mg total) by mouth daily. 90 tablet 3   Aspirin 81 MG CAPS Take 81 mg by mouth daily. 90 capsule 1   atorvastatin (LIPITOR) 80 MG tablet Take 1 tablet (80 mg total) by mouth daily. 90 tablet 3   citalopram (CELEXA) 20 MG tablet TAKE 1 TABLET BY MOUTH NIGHTLY AT BEDTIME 90 tablet 1   fluticasone (FLONASE) 50 MCG/ACT nasal spray USE 2 SPRAYS IN EACH NOSTRIL DAILY 32 g 1   Incontinence Supply Disposable (BLADDER CONTROL PAD REGULAR) MISC Use daily as directed 100 each 11   losartan-hydrochlorothiazide (HYZAAR) 100-25 MG tablet Take 1 tablet by mouth daily. 90 tablet 1   spironolactone (ALDACTONE) 50 MG tablet Take 1 tablet (50 mg total) by mouth daily. 90 tablet 1   traZODone (DESYREL) 50 MG tablet TAKE 1/2 TO 1 TABLET BY MOUTH NIGHTLY AT BEDTIME 90 tablet 1   VICTOZA 18 MG/3ML SOPN inject  1.64m into THE SKIN EVERY DAY. 15 mL 2   No facility-administered medications prior to visit.     Objective:   BP (!) 151/53    Pulse 74    Wt 183 lb 6.4 oz (83.2 kg)    SpO2 100%    BMI 31.48 kg/m  Wt Readings from Last 3 Encounters:  02/14/21 183 lb 6.4 oz (83.2 kg)  10/26/20 167 lb 11.2 oz (76.1 kg)  09/02/20 169 lb (76.7 kg)   BP Readings from Last 3 Encounters:  02/14/21 (!) 151/53  10/26/20 125/69  08/01/20 (!) 126/47   Physical exam General: chronically ill appearing, sitting in wheelchair Cardiac: RRR Pulm: breathing comfortably on room air, lungs clear Psych: normal affect, no SI  Assessment & Plan:   Problem List Items Addressed This Visit       Cardiovascular and Mediastinum   Hypertension associated with diabetes ([place] - Primary (Chronic)    Blood pressure is above goal in the office today--147/59, 151/53 on repeat. She is unsure if she is taking all 3 of the antihypertensives. Her daughter is present at the visit today who [facility] and that the systolic number is typically 120s-1340mg. Review of recent blood pressures from prior visits show systolic range 11132-440NUUVnd diastolic range  69-21mHg. RFP today  Continue losartan-hctz 100-281mdaily, amlodipine 1010maily and spironolactone 70m21mily She reports having a home blood pressure monitor. She will record her blood pressures BID for the next two weeks and then I will follow up with her via tele-health to review her readings. If she is taking her medication appropriately and blood pressures remain above goal, then we may need to add an additional agent such as Imdur   Addendum: K 5.8. renal function stable from prior, phos normal. No evidence of hemolysis. Potassium levels have been running around 5 on prior labs. Will have her hold spironolactone and will send in a one time dose of lokelma. She will return next week for repeat labs. If potassium levels  are still borderline high after holding the spironolactone, we will need to consider stopping in permanently. Plan discussed with pt's daughter, TaraBaxter Flattery   Relevant Medications   liraglutide (VICTOZA) 18 MG/3ML SOPN   spironolactone (ALDACTONE) 50 MG tablet   losartan-hydrochlorothiazide (HYZAAR) 100-25 MG tablet   atorvastatin (LIPITOR) 80 MG tablet   Aspirin 81 MG CAPS   amLODipine (NORVASC) 10 MG tablet   Other Relevant Orders   Renal function panel (Completed)     Endocrine   Type 2 diabetes mellitus with diabetic retinopathy (HCC) (Chronic)    A1C remains at goal at 6.1, since discontinuation of metformin in September.  Continue victoza 1.2mg 69mly Check urine microalbumin/creatinine Declines retinopathy screening Next foot exam due 10/2021      Relevant Medications   liraglutide (VICTOZA) 18 MG/3ML SOPN   losartan-hydrochlorothiazide (HYZAAR) 100-25 MG tablet   atorvastatin (LIPITOR) 80 MG tablet   Aspirin 81 MG CAPS   Other Relevant Orders   Microalbumin / Creatinine Urine Ratio (Completed)   POC Hbg A1C (Completed)   Glucose, capillary (Completed)   [facility]     Genitourinary   CKD (chronic kidney disease) stage 3, GFR 30-59 ml/min (HCC) (Chronic)   Relevant Orders   Renal function panel (Completed)     Other   Healthcare maintenance (Chronic)    Continues to decline advanced care planning including advanced directive and MOST form.      Severe obesity (BMI >= 40) (HCC) (Chronic)   Relevant Medications   liraglutide (VICTOZA) 18 MG/3ML SOPN   Depression (Chronic)    Currently on celexa 20mg,11mzodone 70mg q60mPHQ-9 score is 2 today. Icela cDemicaues to struggle with depression and would like to undergo counseling with our clinical psychologist. Referral placed.       Relevant Medications   traZODone (DESYREL) 50 MG tablet   citalopram (CELEXA) 20 MG tablet   Other Relevant Orders   [facility]    [facility] She notes that her functional status had somewhat improved however she continues to struggle with weakness and stair mobility. Stair mobility is a limiting factor in her ability to safely leave her home. She would benefit from RN care for assistance with medications -Referral to HH PT, [facility], RN      Relevant Medications   atorvastatin (LIPITOR) 80 MG tablet   Aspirin 81 MG CAPS   Other Relevant Orders   Home Health   Face-to-face encounter (required for Medicare/Medicaid patients)   Other Visit Diagnoses     Moderate persistent chronic asthma without complication       Relevant Medications   albuterol (VENTOLIN HFA) 108 (  90 Base) MCG/ACT inhaler   Insomnia, unspecified type       Relevant Medications   traZODone (DESYREL) 50 MG tablet   Hyperkalemia       Relevant Orders   Renal function panel       Follow up  2 weeks via telehealth for blood pressure review 3 months with labs: BMP, A1C, advanced care planning discussion   Pt discussed with Dr. Marty Heck, MD Internal Medicine Resident PGY-2 [facility] 02/15/2021 5:53 PM

## 2021-02-14 ENCOUNTER — Encounter: Payer: Self-pay | Admitting: Internal Medicine

## 2021-02-14 ENCOUNTER — Ambulatory Visit (INDEPENDENT_AMBULATORY_CARE_PROVIDER_SITE_OTHER): Payer: Medicare Other | Admitting: Internal Medicine

## 2021-02-14 VITALS — BP 151/53 | HR 74 | Wt 183.4 lb

## 2021-02-14 DIAGNOSIS — Z8673 Personal history of transient ischemic attack (TIA), and cerebral infarction without residual deficits: Secondary | ICD-10-CM

## 2021-02-14 DIAGNOSIS — Z794 Long term (current) use of insulin: Secondary | ICD-10-CM | POA: Diagnosis not present

## 2021-02-14 DIAGNOSIS — G4700 Insomnia, unspecified: Secondary | ICD-10-CM | POA: Diagnosis not present

## 2021-02-14 DIAGNOSIS — E875 Hyperkalemia: Secondary | ICD-10-CM | POA: Diagnosis not present

## 2021-02-14 DIAGNOSIS — I152 Hypertension secondary to endocrine disorders: Secondary | ICD-10-CM

## 2021-02-14 DIAGNOSIS — E1159 Type 2 diabetes mellitus with other circulatory complications: Secondary | ICD-10-CM

## 2021-02-14 DIAGNOSIS — J4540 Moderate persistent asthma, uncomplicated: Secondary | ICD-10-CM | POA: Diagnosis not present

## 2021-02-14 DIAGNOSIS — E1122 Type 2 diabetes mellitus with diabetic chronic kidney disease: Secondary | ICD-10-CM | POA: Diagnosis not present

## 2021-02-14 DIAGNOSIS — F329 Major depressive disorder, single episode, unspecified: Secondary | ICD-10-CM | POA: Diagnosis not present

## 2021-02-14 DIAGNOSIS — E113593 Type 2 diabetes mellitus with proliferative diabetic retinopathy without macular edema, bilateral: Secondary | ICD-10-CM

## 2021-02-14 DIAGNOSIS — N1832 Chronic kidney disease, stage 3b: Secondary | ICD-10-CM

## 2021-02-14 DIAGNOSIS — M170 Bilateral primary osteoarthritis of knee: Secondary | ICD-10-CM

## 2021-02-14 DIAGNOSIS — R5381 Other malaise: Secondary | ICD-10-CM

## 2021-02-14 DIAGNOSIS — Z0000 Encounter for general adult medical examination without abnormal findings: Secondary | ICD-10-CM

## 2021-02-14 LAB — POCT GLYCOSYLATED HEMOGLOBIN (HGB A1C): Hemoglobin A1C: 6.1 % — AB (ref 4.0–5.6)

## 2021-02-14 LAB — GLUCOSE, CAPILLARY: Glucose-Capillary: 128 mg/dL — ABNORMAL HIGH (ref 70–99)

## 2021-02-14 MED ORDER — ALBUTEROL SULFATE HFA 108 (90 BASE) MCG/ACT IN AERS: INHALATION_SPRAY | RESPIRATORY_TRACT | 3 refills | Status: DC

## 2021-02-14 MED ORDER — CITALOPRAM HYDROBROMIDE 20 MG PO TABS: ORAL_TABLET | ORAL | 1 refills | Status: DC

## 2021-02-14 MED ORDER — SPIRONOLACTONE 50 MG PO TABS: 50.0000 mg | ORAL_TABLET | Freq: Every day | ORAL | 1 refills | Status: DC

## 2021-02-14 MED ORDER — VICTOZA 18 MG/3ML ~~LOC~~ SOPN: PEN_INJECTOR | SUBCUTANEOUS | 2 refills | Status: DC

## 2021-02-14 MED ORDER — LOSARTAN POTASSIUM-HCTZ 100-25 MG PO TABS: 1.0000 | ORAL_TABLET | Freq: Every day | ORAL | 1 refills | Status: DC

## 2021-02-14 MED ORDER — ATORVASTATIN CALCIUM 80 MG PO TABS: 80.0000 mg | ORAL_TABLET | Freq: Every day | ORAL | 3 refills | Status: DC

## 2021-02-14 MED ORDER — AMLODIPINE BESYLATE 10 MG PO TABS: 10.0000 mg | ORAL_TABLET | Freq: Every day | ORAL | 3 refills | Status: DC

## 2021-02-14 MED ORDER — TRAZODONE HCL 50 MG PO TABS: ORAL_TABLET | ORAL | 1 refills | Status: DC

## 2021-02-14 MED ORDER — ASPIRIN 81 MG PO CAPS: 81.0000 mg | ORAL_CAPSULE | Freq: Every day | ORAL | 1 refills | Status: DC

## 2021-02-14 MED ORDER — FLUTICASONE PROPIONATE 50 MCG/ACT NA SUSP: 2.0000 | Freq: Every day | NASAL | 1 refills | Status: DC

## 2021-02-14 NOTE — Assessment & Plan Note (Addendum)
Currently on celexa 20mg , trazodone 50mg  qHS. PHQ-9 score is 2 today. Chelsea [name] continues to struggle with depression and would like to undergo counseling with our clinical psychologist. Referral placed.

## 2021-02-14 NOTE — Assessment & Plan Note (Addendum)
Blood pressure is above goal in the office today--147/59, 151/53 on repeat. She is unsure if she is taking all 3 of the antihypertensives. Her daughter is present at the visit today who notes that they and [facility] intermittently monitor her blood pressure and that the systolic number is typically 120s-165mmHg. Review of recent blood pressures from prior visits show systolic range 195-093OIZT and diastolic range 24-58KDXI.  RFP today   Continue losartan-hctz 100-25mg  daily, amlodipine 10mg  daily and spironolactone 50mg  daily  She reports having a home blood pressure monitor. She will record her blood pressures BID for the next two weeks and then I will follow up with her via tele-health to review her readings. If she is taking her medication appropriately and blood pressures remain above goal, then we may need to add an additional agent such as Imdur   Addendum: K 5.8. renal function stable from prior, phos normal. No evidence of hemolysis. Potassium levels have been running around 5 on prior labs. Will have her hold spironolactone and will send in a one time dose of lokelma. She will return next week for repeat labs. If potassium levels are still borderline high after holding the spironolactone, we will need to consider stopping in permanently. Plan discussed with pt's daughter, [name].

## 2021-02-14 NOTE — Patient Instructions (Addendum)
[name], it was a pleasure seeing you today!  Today we discussed:  Diabetes: Your A1C is 6.1. Continue taking Victoza.  Depression: I have placed a referral to Dr. Theodis Shove, our clinical psychologist. She will call you for an appointment.  Blood pressure: Your blood pressure is fairly elevated in the office today. Please ensure you are taking all of your medication as directed. Obtain a blood pressure cuff, if you do not already have one, and check your blood pressure twice a day. Record these readings for 2 weeks and arrange a follow up telephone visit with me so we can review them together.  Follow-up: 2 weeks.  Please make sure to arrive 15 minutes prior to your next appointment. If you arrive late, you may be asked to reschedule.   We look forward to seeing you next time. Please call our clinic at [phone] if you have any questions or concerns. The best time to call is Monday-Friday from 9am-4pm, but there is someone available 24/7. If after hours or the weekend, call the main hospital number and ask for the Internal Medicine Resident On-Call. If you need medication refills, please notify your pharmacy one week in advance and they will send [place] a request.  Thank you for letting us take part in your care. Wishing you the best!

## 2021-02-14 NOTE — Assessment & Plan Note (Signed)
A1C remains at goal at 6.1, since discontinuation of metformin in September.   Continue victoza 1.2mg  daily  Check urine microalbumin/creatinine  Declines retinopathy screening  Next foot exam due 10/2021

## 2021-02-14 NOTE — Assessment & Plan Note (Signed)
Continues to decline advanced care planning including advanced directive and MOST form.

## 2021-02-15 LAB — RENAL FUNCTION PANEL
Albumin: 4.2 g/dL (ref 3.7–4.7)
BUN/Creatinine Ratio: 31 — ABNORMAL HIGH (ref 12–28)
BUN: 44 mg/dL — ABNORMAL HIGH (ref 8–27)
CO2: 16 mmol/L — ABNORMAL LOW (ref 20–29)
Calcium: 9.3 mg/dL (ref 8.7–10.3)
Chloride: 109 mmol/L — ABNORMAL HIGH (ref 96–106)
Creatinine, Ser: 1.43 mg/dL — ABNORMAL HIGH (ref 0.57–1.00)
Glucose: 123 mg/dL — ABNORMAL HIGH (ref 70–99)
Phosphorus: 3.7 mg/dL (ref 3.0–4.3)
Potassium: 5.8 mmol/L — ABNORMAL HIGH (ref 3.5–5.2)
Sodium: 138 mmol/L (ref 134–144)
eGFR: 39 mL/min/{1.73_m2} — ABNORMAL LOW (ref >59)

## 2021-02-15 LAB — MICROALBUMIN / CREATININE URINE RATIO
Creatinine, Urine: 57.5 mg/dL
Microalb/Creat Ratio: 219 mg/g creat — ABNORMAL HIGH (ref 0–29)
Microalbumin, Urine: 125.9 ug/mL

## 2021-02-15 MED ORDER — LOKELMA 10 G PO PACK: 10.0000 g | PACK | Freq: Once | ORAL | 0 refills | Status: AC

## 2021-02-15 NOTE — Addendum Note (Signed)
Addended by: Mitzi Hansen on: 02/15/2021 05:54 PM   Modules accepted: Orders

## 2021-02-16 ENCOUNTER — Telehealth: Payer: Self-pay

## 2021-02-16 ENCOUNTER — Telehealth: Payer: Self-pay | Admitting: *Deleted

## 2021-02-16 MED ORDER — SODIUM POLYSTYRENE SULFONATE PO POWD: Freq: Once | ORAL | 0 refills | Status: AC

## 2021-02-16 NOTE — Telephone Encounter (Signed)
Latoya from [employer] called in stating that patient's insurance will not cover [place]. Out of pocket cost is $60 which patient cannot afford. Insurance will cover SPS (Sodium polystyrene sulfonate). Please send new Rx if appropriate. If not, a PA can be attempted. Phineas Real is faxing over PA now.

## 2021-02-16 NOTE — Chronic Care Management (AMB) (Signed)
°  Care Management   Note  02/16/2021 Name: Chelsea [name] MRN: [id number] DOB: 1950/06/15  Chelsea [name] is a 71 y.o. year old female who is a primary care patient of Christian, Rylee, MD. I reached out to Chelsea [name] by phone today in response to a referral sent by Chelsea [name] primary care provider.   Chelsea [name] was given information about care management services today including:  Care management services include personalized support from designated clinical staff supervised by her physician, including individualized plan of care and coordination with other care providers 24/7 contact phone numbers for assistance for urgent and routine care needs. The patient may stop care management services at any time by phone call to the office staff.  Patient agreed to services and verbal consent obtained.   Follow up plan: Telephone appointment with care management team member scheduled VOH:YWVP 02/17/21 and BSW on 02/20/21  [facility]  Direct Dial: [phone]

## 2021-02-16 NOTE — Telephone Encounter (Signed)
Thank you, will send new script

## 2021-02-16 NOTE — Addendum Note (Signed)
Addended by: Mitzi Hansen on: 02/16/2021 04:09 PM   Modules accepted: Orders

## 2021-02-16 NOTE — Telephone Encounter (Signed)
PA came through on cover my meds for PT ( LOKELMA 5 MG PACKETS ) was done and submitted with office notes from 1/3.[name] Kitchen awaiting approval or denial..    UPDATE :  Your information has been submitted to [facility]. [employer] will review the request and will issue a decision, typically within 3-7 days from your submission. You can check the updated outcome later by reopening this request.

## 2021-02-17 ENCOUNTER — Ambulatory Visit: Payer: Medicare Other

## 2021-02-17 NOTE — Patient Instructions (Signed)
Visit Information  Thank you for taking time to visit with me today. Please don't hesitate to contact me if I can be of assistance to you before our next scheduled telephone appointment.  Our next appointment is by telephone on 03/03/21 at 0930.  Please call the care guide team at [phone] if you need to cancel or reschedule your appointment.   If you are experiencing a Mental Health or [place] or need someone to talk to, please call the [place] National Suicide Prevention Lifeline: (951) 283-2995 or TTY: 435 583 5338 TTY [phone]) to talk to a trained counselor   The patient verbalized understanding of instructions, educational materials, and care plan provided today and declined offer to receive copy of patient instructions, educational materials, and care plan.   The patient has been provided with contact information for the care management team and has been advised to call with any health related questions or concerns.   Johnney Killian, RN, BSN, CCM Care Management Coordinator [facility] Internal Medicine Phone: [phone]: [phone]

## 2021-02-17 NOTE — Chronic Care Management (AMB) (Signed)
Care Management    RN Visit Note  02/17/2021 Name: Chelsea [name] MRN: [id number] DOB: 27-Feb-1950  Subjective: Chelsea [name] is a 71 y.o. year old female who is a primary care patient of Christian, Lucinda Dell, MD. The care management team was consulted for assistance with disease management and care coordination needs.    Engaged with patient by telephone for initial visit in response to provider referral for case management and/or care coordination services.   Consent to Services:   Chelsea [name] was given information about Care Management services today including:  Care Management services includes personalized support from designated clinical staff supervised by her physician, including individualized plan of care and coordination with other care providers 24/7 contact phone numbers for assistance for urgent and routine care needs. The patient may stop case management services at any time by phone call to the office staff.  Patient agreed to services and consent obtained.   Assessment: Review of patient past medical history, allergies, medications, health status, including review of consultants reports, laboratory and other test data, was performed as part of comprehensive evaluation and provision of chronic care management services.   SDOH (Social Determinants of Health) assessments and interventions performed:  SDOH Interventions    Flowsheet Row Most Recent Value  SDOH Interventions   Housing Interventions Intervention Not Indicated  Physical Activity Interventions Other (Comments)  [HH PT, OT]  Social Connections Interventions Intervention Not Indicated        Care Plan  Allergies  Allergen Reactions   Atarax [Hydroxyzine]     Hallucination    Lisinopril Cough   Nsaids Nausea And Vomiting   Penicillins Other (See Comments)    shaking    Outpatient Encounter Medications as of 02/17/2021  Medication Sig   amLODipine (NORVASC) 10 MG tablet Take 1 tablet (10 mg total) by  mouth daily.   Aspirin 81 MG CAPS Take 81 mg by mouth daily.   atorvastatin (LIPITOR) 80 MG tablet Take 1 tablet (80 mg total) by mouth daily.   citalopram (CELEXA) 20 MG tablet TAKE 1 TABLET BY MOUTH NIGHTLY AT BEDTIME   fluticasone (FLONASE) 50 MCG/ACT nasal spray Place 2 sprays into both nostrils daily.   liraglutide (VICTOZA) 18 MG/3ML SOPN inject 1.32m into THE SKIN EVERY DAY.   losartan-hydrochlorothiazide (HYZAAR) 100-25 MG tablet Take 1 tablet by mouth daily.   spironolactone (ALDACTONE) 50 MG tablet Take 1 tablet (50 mg total) by mouth daily.   traZODone (DESYREL) 50 MG tablet TAKE 1/2 TO 1 TABLET BY MOUTH NIGHTLY AT BEDTIME   Accu-Chek Softclix Lancets lancets Use to check blood sugar 2 times per day.   albuterol (VENTOLIN HFA) 108 (90 Base) MCG/ACT inhaler INHALE 2 PUFFS into lungs EVERY 6 HOURS AS NEEDED FOR WHEEZING OR SHORTNESS OF BREATH   Blood Glucose Monitoring Suppl (ACCU-CHEK GUIDE ME) w/Device KIT Use to check blood sugar one time per day.   glucose blood (ACCU-CHEK GUIDE) test strip Use to check blood sugar two times per day.   Lancets Misc. (ACCU-CHEK FASTCLIX LANCET) KIT Use to check blood sugar twice a day as discussed.   SURE COMFORT PEN NEEDLES 32G X 4 MM MISC Use once daily to inject victoza   [DISCONTINUED] Lancet Device MISC 1 each by Does not apply route 3 (three) times daily.   No facility-administered encounter medications on file as of 02/17/2021.    Patient Active Problem List   Diagnosis Date Noted   Anemia of chronic disease 10/23/2019   CKD (  chronic kidney disease) stage 3, GFR 30-59 ml/min (HCC)    History of stroke 10/06/2017   Depression 08/28/2013   Severe obesity (BMI >= 40) ([place]) 03/12/2013   Primary localized osteoarthritis of knees, bilateral 11/08/2011   Healthcare maintenance 08/15/2010   Hyperlipidemia associated with type 2 diabetes mellitus ([place]) 12/26/2005   Hypertension associated with diabetes ([place]) 12/26/2005   Type 2 diabetes  mellitus with diabetic retinopathy ([place]) 02/12/1993    Conditions to be addressed/monitored: HTN, DMII, CKD Stage 3, and Hyperkalemia  Care Plan : RN Care Manager Plan of Care  Updates made by Johnney Killian, RN since 02/17/2021 12:00 AM     Problem: Chronic Disease Management and Care Coordination Needs (HTN, DM2 with diabetic retinopathy, CKD3 and Hyperkalemia)   Priority: High  Onset Date: 02/17/2021  Note:   Current Barriers: Successful initial outreach to patient this morning.  Patient noted that she has been feeling ok but has not had her blood pressure checked since she was at office visit.  She stated that she has a caregiver that comes in to assist her with her bathing and that caregiver will take her blood pressure.  Requested patient have caregiver keep blood pressure log writing down the date and time of the blood pressure reading.  Dr. Darrick Meigs requested 2 weeks of blood pressure reading at the recent office visit.  Patient shared that her son lives with her and he leaves for work at 8 am each day.  Patient then gets up and has some cereal for breakfast.  She uses a walker to get around and has had a fall over the past year on her birthday[birth year].  Patient has 3 steps to get into her home and when she was unable to go up the stairs and fell on her hands.  She did not have significant injury but remains at high risk for future falls due to her poor mobility and diabetic retinopathy.  Patient usually eats microwave meals for lunch, she does not cook much anymore.  She was upset that she is not suppose to eat bananas due to her Hyperkalemia as they are one of her favorite foods.  Patient notes her caregiver pours her medications for her every 3 days.  This RNCM discussed follow up in 2 weeks to obtain her blood pressure readings and patient agreed to plan. Knowledge Deficits related to plan of care for management of HTN, DMII, CKD Stage 3, and Hyperkalemia  Chronic Disease Management  support and education needs related to HTN, DMII, CKD Stage 3, and Hyperkalemia  Financial Constraints   RNCM Clinical Goal(s):  Patient will verbalize basic understanding of  HTN, DMII, CKD Stage 3, and Hyperkalemia disease process and self health management plan as evidenced by discussions with provider and RNCM. demonstrate understanding of rationale for each prescribed medication as evidenced by provider and RNCM discussing each medication and appropriate refills demonstrate Improved adherence to prescribed treatment plan for HTN, DMII, CKD Stage 3, and Hyperkalemia as evidenced by decreased potassium level. continue to work with RN Care Manager to address care management and care coordination needs related to  HTN, DMII, CKD Stage 3, and Hyperkalemia as evidenced by adherence to CM Team Scheduled appointments work with Education officer, museum to address  related to the management of Financial constraints, Level of care concerns, and Inability to perform ADL's independently related to the management of as evidenced by review of EMR and patient or social worker report not experience hospital admission as evidenced by  review of EMR. Hospital Admissions in last 6 months =  0- through collaboration with RN Care manager, provider, and care team.   Interventions: 1:1 collaboration with primary care provider regarding development and update of comprehensive plan of care as evidenced by provider attestation and co-signature Inter-disciplinary care team collaboration (see longitudinal plan of care) Evaluation of current treatment plan related to  self management and patient's adherence to plan as established by provider    Chronic Kidney Disease Interventions:  (Status:  Goal on track:  NO.) Long Term Goal Evaluation of current treatment plan related to chronic kidney disease self management and patient's adherence to plan as established by provider      Reviewed medications with patient and discussed importance  of compliance    Advised patient, providing education and rationale, to monitor blood pressure daily and record, calling PCP for findings outside established parameters    Reviewed scheduled/upcoming provider appointments including    Discussed plans with patient for ongoing care management follow up and provided patient with direct contact information for care management team    Last practice recorded BP readings:  BP Readings from Last 3 Encounters:  02/14/21 (!) 151/53  10/26/20 125/69  08/01/20 (!) 126/47  Most recent eGFR/CrCl:  Lab Results  Component Value Date   EGFR 39 (L) 02/14/2021    No components found for: CRCL    Diabetes Interventions:  (Status:  Goal on track:  Yes.) Long Term Goal Assessed patient's understanding of A1c goal: <7% Reviewed medications with patient and discussed importance of medication adherence Discussed plans with patient for ongoing care management follow up and provided patient with direct contact information for care management team Review of patient status, including review of consultants reports, relevant laboratory and other test results, and medications completed Assessed social determinant of health barriers Lab Results  Component Value Date   HGBA1C 6.1 (A) 02/14/2021   Hypertension Interventions:  (Status:  Goal on track:  NO.) Long Term Goal Last practice recorded BP readings:  BP Readings from Last 3 Encounters:  02/14/21 (!) 151/53  10/26/20 125/69  08/01/20 (!) 126/47  Most recent eGFR/CrCl:  Lab Results  Component Value Date   EGFR 39 (L) 02/14/2021    No components found for: CRCL  Evaluation of current treatment plan related to hypertension self management and patient's adherence to plan as established by provider Reviewed medications with patient and discussed importance of compliance Discussed plans with patient for ongoing care management follow up and provided patient with direct contact information for care management  team Provided education on prescribed diet related to Hyperkalemia as patient concerned because she notes she loves bananas and is not suppose to eat them.  Patient Goals/Self-Care Activities: Take all medications as prescribed Attend all scheduled provider appointments Call pharmacy for medication refills 3-7 days in advance of running out of medications Call provider office for new concerns or questions  Work with the social worker to address care coordination needs and will continue to work with the clinical team to address health care and disease management related needs check blood pressure daily write blood pressure results in a log or diary keep a blood pressure log take blood pressure log to all doctor appointments keep all doctor appointments take medications for blood pressure exactly as prescribed report new symptoms to your doctor  Follow Up Plan:  The patient has been provided with contact information for the care management team and has been advised to call with any health related  questions or concerns.      Goal: Self-Management Plan Developed   Note:   Evidence-based guidance:  Review biopsychosocial determinants of health screens.  Determine level of modifiable health risk.  Assess level of patient activation, level of readiness, importance and confidence to make changes.  Evoke change talk using open-ended questions, pros and cons, as well as looking forward.  Identify areas where behavior change may lead to improved health.  Partner with patient to develop a robust self-management plan that includes lifestyle factors, such as weight loss, exercise and healthy nutrition, as well as goals specific to disease risks.  Support patient and family/caregiver active participation in decision-making and self-management plan.  Implement additional goals and interventions based on identified risk factors to reduce health risk.  Facilitate advance care planning.  Review need for  preventive screening based on age, sex, family history and health history.   Notes:     Plan: The patient has been provided with contact information for the care management team and has been advised to call with any health related questions or concerns.   Johnney Killian, RN, BSN, CCM Care Management Coordinator [facility] Internal Medicine Phone: [phone]: [phone]

## 2021-02-20 ENCOUNTER — Telehealth: Payer: Self-pay | Admitting: Licensed Clinical Social Worker

## 2021-02-20 ENCOUNTER — Telehealth: Payer: Medicare Other

## 2021-02-20 NOTE — Telephone Encounter (Signed)
°  Care Management   Follow Up Note   02/20/2021 Name: Chelsea [name] MRN: [id number] DOB: 1950-07-25   Referred by: Mitzi Hansen, MD Reason for referral : No chief complaint on file.   An unsuccessful telephone outreach was attempted today. The patient was referred to the case management team for assistance with care management and care coordination.   Follow Up Plan: The care management team will reach out to the patient again over the next 7 days.   Milus Height, [place]  Social Worker [facility] Care Management  [phone]

## 2021-02-22 ENCOUNTER — Ambulatory Visit: Payer: Self-pay | Admitting: Licensed Clinical Social Worker

## 2021-02-22 NOTE — Patient Instructions (Signed)
Visit Information ? ?Instructions: patient will work with SW to address concerns related to transportation. ? ?Patient was given the following information about care management and care coordination services today, agreed to services, and gave verbal consent: 1.care management/care coordination services include personalized support from designated clinical staff supervised by their physician, including individualized plan of care and coordination with other care providers 2. 24/7 contact phone numbers for assistance for urgent and routine care needs. 3. The patient may stop care management/care coordination services at any time by phone call to the office staff. ? ?Patient verbalizes understanding of instructions and care plan provided today and agrees to view in MyChart. Active MyChart status confirmed with patient.   ? ?The care management team will reach out to the patient again over the next 30 days.  ? ?Kyheem Bathgate, BSW  ?Social Worker ?[facility] Care Management  ?[phone] ?  ? ?  ?

## 2021-02-22 NOTE — Chronic Care Management (AMB) (Signed)
°  Care Management   Social Work Visit Note  02/22/2021 Name: ALLYSSON RINEHIMER MRN: [id number] DOB: 14-Mar-1950  LAIZA VEENSTRA is a 71 y.o. year old female who sees Mitzi Hansen, MD for primary care. The care management team was consulted for assistance with care management and care coordination needs related to Transportation Needs    Patient was given the following information about care management and care coordination services today, agreed to services, and gave verbal consent: 1.care management/care coordination services include personalized support from designated clinical staff supervised by their physician, including individualized plan of care and coordination with other care providers 2. 24/7 contact phone numbers for assistance for urgent and routine care needs. 3. The patient may stop care management/care coordination services at any time by phone call to the office staff.  Engaged with patient by telephone for initial visit in response to provider referral for social work chronic care management and care coordination services.  Assessment: Review of patient history, allergies, and health status during evaluation of patient need for care management/care coordination services.    Interventions:  Patient interviewed and appropriate assessments performed Collaborated with clinical team regarding patient needs  Patient in need of transportation. SW completed SCAT application and emailed to [email or web address].  Patient declined needing; food or housing. Patient has a support system. Patient requested SW to contact her back within 30 days.  SDOH (Social Determinants of Health) assessments performed: Yes     Plan:  patient will work with BSW to address needs related to Industrial/product designer will follow up with patient within 30 days.   Milus Height, [place]  Social Worker [facility] Care Management  [phone]

## 2021-02-28 NOTE — Addendum Note (Signed)
Addended by: Mitzi Hansen on: 02/28/2021 09:03 AM   Modules accepted: Orders

## 2021-03-02 ENCOUNTER — Other Ambulatory Visit (INDEPENDENT_AMBULATORY_CARE_PROVIDER_SITE_OTHER): Payer: Medicare Other

## 2021-03-02 ENCOUNTER — Other Ambulatory Visit: Payer: Self-pay

## 2021-03-02 DIAGNOSIS — E875 Hyperkalemia: Secondary | ICD-10-CM

## 2021-03-02 NOTE — Addendum Note (Signed)
Addended by: Mitzi Hansen on: 03/02/2021 04:05 PM   Modules accepted: Orders

## 2021-03-03 ENCOUNTER — Telehealth: Payer: Medicare Other

## 2021-03-03 ENCOUNTER — Encounter: Payer: Self-pay | Admitting: Internal Medicine

## 2021-03-03 ENCOUNTER — Other Ambulatory Visit: Payer: Self-pay | Admitting: Internal Medicine

## 2021-03-03 DIAGNOSIS — E875 Hyperkalemia: Secondary | ICD-10-CM | POA: Insufficient documentation

## 2021-03-03 LAB — RENAL FUNCTION PANEL
Albumin: 3.9 g/dL (ref 3.7–4.7)
BUN/Creatinine Ratio: 25 (ref 12–28)
BUN: 39 mg/dL — ABNORMAL HIGH (ref 8–27)
CO2: 16 mmol/L — ABNORMAL LOW (ref 20–29)
Calcium: 9.1 mg/dL (ref 8.7–10.3)
Chloride: 105 mmol/L (ref 96–106)
Creatinine, Ser: 1.59 mg/dL — ABNORMAL HIGH (ref 0.57–1.00)
Glucose: 113 mg/dL — ABNORMAL HIGH (ref 70–99)
Phosphorus: 4.8 mg/dL — ABNORMAL HIGH (ref 3.0–4.3)
Potassium: 6.3 mmol/L — ABNORMAL HIGH (ref 3.5–5.2)
Sodium: 137 mmol/L (ref 134–144)
eGFR: 34 mL/min/{1.73_m2} — ABNORMAL LOW (ref >59)

## 2021-03-03 MED ORDER — SODIUM BICARBONATE 650 MG PO TABS: 650.0000 mg | ORAL_TABLET | Freq: Three times a day (TID) | ORAL | 0 refills | Status: DC

## 2021-03-03 MED ORDER — VELTASSA 25.2 G PO PACK: 1.0000 | PACK | Freq: Every day | ORAL | 0 refills | Status: DC

## 2021-03-03 NOTE — Assessment & Plan Note (Signed)
S/p lokelma 10mg  x1 and holding spironolactone x7d.  Potassium is increased from 5.8>6.3 on repeat labs from 1/19. Phos now up to 4.3, previously normal. Creatinine unchanged.  Her potassium is clearly elevated but difficult to ascertain how much is hemolysis related. I confirmed with her daughter that she has not been taking the spironolactone. I have also discussed dietary discretions with Muriel however this may also be playing a role. The elevated phos leads me to think that this is most likely true hyperkalemia although her creatinine and bicarb are at baseline, with AG of 16, thought to be CKD related.  I relayed the results of her lab to her daughter, [name], along with the following recommendations  Discontinue losartan-hctz, will send in hctz alone. Continue holding spironolactone  Start lokelma daily  Start sodium bicarb   Daughter is currently working and unable to bring her to the office before noon today to check an EKG. Discussed the risk of cardiac arrhythmias associated with hyperkalemia. Recommended she present to an urgent care when able to ensure no new EKG changes requiring emergent therapies.   She will return for another lab only visit on Monday for repeat RFP

## 2021-03-03 NOTE — Progress Notes (Signed)
UPDATE NOTE:  Potassium is increased from 5.8>6.3 on repeat labs from 1/19. Phos now up to 4.3, previously normal. Creatinine unchanged.  S/p lokelma 10mg  x1 and holding spironolactone x7d.   Her potassium is clearly elevated but difficult to ascertain how much is hemolysis related. I confirmed with her daughter that she has not been taking the spironolactone. I have also discussed dietary discretions with [place] however this may also be playing a role. The elevated phos leads me to think that this is most likely true hyperkalemia although her creatinine and bicarb are at baseline, with AG of 16, thought to be CKD related.   I relayed the results of her lab to her daughter, [name], along with the following recommendations: Discontinue losartan-hctz, will send in hctz alone. Continue holding spironolactone Start lokelma daily Start sodium bicarb  Daughter is currently working and unable to bring her to the office before noon today to check an EKG. Discussed the risk of cardiac arrhythmias associated with hyperkalemia. Recommended she present to an urgent care when able to ensure no new EKG changes requiring emergent therapies.  She will return for another lab only visit on Monday for repeat RFP I have previously recommended she start to follow with nephrology however she has been resistant to this.   Mitzi Hansen, MD Internal Medicine Resident PGY-3 [facility] 03/03/2021 11:11 AM

## 2021-03-04 ENCOUNTER — Observation Stay (HOSPITAL_COMMUNITY): Payer: Medicare Other

## 2021-03-04 ENCOUNTER — Other Ambulatory Visit: Payer: Self-pay

## 2021-03-04 ENCOUNTER — Observation Stay (HOSPITAL_COMMUNITY)
Admission: EM | Admit: 2021-03-04 | Discharge: 2021-03-05 | Disposition: A | Discharge status: Home / Self Care | Payer: Medicare Other | Attending: Internal Medicine | Admitting: Internal Medicine

## 2021-03-04 ENCOUNTER — Encounter (HOSPITAL_COMMUNITY): Payer: Self-pay

## 2021-03-04 DIAGNOSIS — I129 Hypertensive chronic kidney disease with stage 1 through stage 4 chronic kidney disease, or unspecified chronic kidney disease: Secondary | ICD-10-CM | POA: Diagnosis not present

## 2021-03-04 DIAGNOSIS — J45909 Unspecified asthma, uncomplicated: Secondary | ICD-10-CM | POA: Diagnosis not present

## 2021-03-04 DIAGNOSIS — N281 Cyst of kidney, acquired: Secondary | ICD-10-CM | POA: Diagnosis not present

## 2021-03-04 DIAGNOSIS — Z7982 Long term (current) use of aspirin: Secondary | ICD-10-CM | POA: Insufficient documentation

## 2021-03-04 DIAGNOSIS — N183 Chronic kidney disease, stage 3 unspecified: Secondary | ICD-10-CM | POA: Diagnosis present

## 2021-03-04 DIAGNOSIS — N179 Acute kidney failure, unspecified: Secondary | ICD-10-CM | POA: Diagnosis not present

## 2021-03-04 DIAGNOSIS — Z20822 Contact with and (suspected) exposure to covid-19: Secondary | ICD-10-CM | POA: Diagnosis not present

## 2021-03-04 DIAGNOSIS — I10 Essential (primary) hypertension: Secondary | ICD-10-CM | POA: Diagnosis not present

## 2021-03-04 DIAGNOSIS — I152 Hypertension secondary to endocrine disorders: Secondary | ICD-10-CM | POA: Diagnosis present

## 2021-03-04 DIAGNOSIS — E875 Hyperkalemia: Principal | ICD-10-CM | POA: Diagnosis present

## 2021-03-04 DIAGNOSIS — D631 Anemia in chronic kidney disease: Secondary | ICD-10-CM | POA: Diagnosis not present

## 2021-03-04 DIAGNOSIS — E11319 Type 2 diabetes mellitus with unspecified diabetic retinopathy without macular edema: Secondary | ICD-10-CM | POA: Diagnosis present

## 2021-03-04 DIAGNOSIS — E1122 Type 2 diabetes mellitus with diabetic chronic kidney disease: Secondary | ICD-10-CM | POA: Diagnosis not present

## 2021-03-04 DIAGNOSIS — Z79899 Other long term (current) drug therapy: Secondary | ICD-10-CM | POA: Insufficient documentation

## 2021-03-04 DIAGNOSIS — E1159 Type 2 diabetes mellitus with other circulatory complications: Secondary | ICD-10-CM | POA: Diagnosis present

## 2021-03-04 DIAGNOSIS — D638 Anemia in other chronic diseases classified elsewhere: Secondary | ICD-10-CM | POA: Diagnosis present

## 2021-03-04 LAB — URINALYSIS, ROUTINE W REFLEX MICROSCOPIC
Bilirubin Urine: NEGATIVE
Glucose, UA: NEGATIVE mg/dL
Hgb urine dipstick: NEGATIVE
Ketones, ur: NEGATIVE mg/dL
Leukocytes,Ua: NEGATIVE
Nitrite: NEGATIVE
Protein, ur: NEGATIVE mg/dL
Specific Gravity, Urine: 1.008 (ref 1.005–1.030)
pH: 7 (ref 5.0–8.0)

## 2021-03-04 LAB — IRON AND TIBC
Iron: 84 ug/dL (ref 28–170)
Saturation Ratios: 30 % (ref 10.4–31.8)
TIBC: 283 ug/dL (ref 250–450)
UIBC: 199 ug/dL

## 2021-03-04 LAB — HEPATIC FUNCTION PANEL
ALT: 21 U/L (ref 0–44)
AST: 21 U/L (ref 15–41)
Albumin: 3.2 g/dL — ABNORMAL LOW (ref 3.5–5.0)
Alkaline Phosphatase: 84 U/L (ref 38–126)
Bilirubin, Direct: <0.1 mg/dL (ref 0.0–0.2)
Total Bilirubin: 0.3 mg/dL (ref 0.3–1.2)
Total Protein: 6.7 g/dL (ref 6.5–8.1)

## 2021-03-04 LAB — CBC WITH DIFFERENTIAL/PLATELET
Abs Immature Granulocytes: 0.01 10*3/uL (ref 0.00–0.07)
Basophils Absolute: 0.1 10*3/uL (ref 0.0–0.1)
Basophils Relative: 1 %
Eosinophils Absolute: 0.2 10*3/uL (ref 0.0–0.5)
Eosinophils Relative: 3 %
HCT: 28.7 % — ABNORMAL LOW (ref 36.0–46.0)
Hemoglobin: 9 g/dL — ABNORMAL LOW (ref 12.0–15.0)
Immature Granulocytes: 0 %
Lymphocytes Relative: 29 %
Lymphs Abs: 1.7 10*3/uL (ref 0.7–4.0)
MCH: 30.2 pg (ref 26.0–34.0)
MCHC: 31.4 g/dL (ref 30.0–36.0)
MCV: 96.3 fL (ref 80.0–100.0)
Monocytes Absolute: 0.2 10*3/uL (ref 0.1–1.0)
Monocytes Relative: 4 %
Neutro Abs: 3.7 10*3/uL (ref 1.7–7.7)
Neutrophils Relative %: 63 %
Platelets: 248 10*3/uL (ref 150–400)
RBC: 2.98 MIL/uL — ABNORMAL LOW (ref 3.87–5.11)
RDW: 13.4 % (ref 11.5–15.5)
WBC: 5.9 10*3/uL (ref 4.0–10.5)
nRBC: 0 % (ref 0.0–0.2)

## 2021-03-04 LAB — BASIC METABOLIC PANEL
Anion gap: 5 (ref 5–15)
Anion gap: 4 — ABNORMAL LOW (ref 5–15)
Anion gap: 9 (ref 5–15)
BUN: 35 mg/dL — ABNORMAL HIGH (ref 8–23)
BUN: 35 mg/dL — ABNORMAL HIGH (ref 8–23)
BUN: 35 mg/dL — ABNORMAL HIGH (ref 8–23)
CO2: 22 mmol/L (ref 22–32)
CO2: 22 mmol/L (ref 22–32)
CO2: 19 mmol/L — ABNORMAL LOW (ref 22–32)
Calcium: 8.8 mg/dL — ABNORMAL LOW (ref 8.9–10.3)
Calcium: 8.7 mg/dL — ABNORMAL LOW (ref 8.9–10.3)
Calcium: 9 mg/dL (ref 8.9–10.3)
Chloride: 109 mmol/L (ref 98–111)
Chloride: 112 mmol/L — ABNORMAL HIGH (ref 98–111)
Chloride: 110 mmol/L (ref 98–111)
Creatinine, Ser: 1.81 mg/dL — ABNORMAL HIGH (ref 0.44–1.00)
Creatinine, Ser: 1.73 mg/dL — ABNORMAL HIGH (ref 0.44–1.00)
Creatinine, Ser: 1.67 mg/dL — ABNORMAL HIGH (ref 0.44–1.00)
GFR, Estimated: 29 mL/min — ABNORMAL LOW (ref >60)
GFR, Estimated: 31 mL/min — ABNORMAL LOW (ref >60)
GFR, Estimated: 32 mL/min — ABNORMAL LOW (ref >60)
Glucose, Bld: 162 mg/dL — ABNORMAL HIGH (ref 70–99)
Glucose, Bld: 110 mg/dL — ABNORMAL HIGH (ref 70–99)
Glucose, Bld: 148 mg/dL — ABNORMAL HIGH (ref 70–99)
Potassium: 6.2 mmol/L — ABNORMAL HIGH (ref 3.5–5.1)
Potassium: 6.4 mmol/L (ref 3.5–5.1)
Potassium: 5.2 mmol/L — ABNORMAL HIGH (ref 3.5–5.1)
Sodium: 136 mmol/L (ref 135–145)
Sodium: 138 mmol/L (ref 135–145)
Sodium: 138 mmol/L (ref 135–145)

## 2021-03-04 LAB — CBG MONITORING, ED
Glucose-Capillary: 106 mg/dL — ABNORMAL HIGH (ref 70–99)
Glucose-Capillary: 63 mg/dL — ABNORMAL LOW (ref 70–99)

## 2021-03-04 LAB — RESP PANEL BY RT-PCR (FLU A&B, COVID) ARPGX2
Influenza A by PCR: NEGATIVE
Influenza B by PCR: NEGATIVE
SARS Coronavirus 2 by RT PCR: NEGATIVE

## 2021-03-04 LAB — RETICULOCYTES
Immature Retic Fract: 4.5 % (ref 2.3–15.9)
RBC.: 2.88 MIL/uL — ABNORMAL LOW (ref 3.87–5.11)
Retic Count, Absolute: 52.1 10*3/uL (ref 19.0–186.0)
Retic Ct Pct: 1.8 % (ref 0.4–3.1)

## 2021-03-04 LAB — FERRITIN: Ferritin: 83 ng/mL (ref 11–307)

## 2021-03-04 LAB — GLUCOSE, CAPILLARY
Glucose-Capillary: 161 mg/dL — ABNORMAL HIGH (ref 70–99)
Glucose-Capillary: 113 mg/dL — ABNORMAL HIGH (ref 70–99)

## 2021-03-04 MED ORDER — LORAZEPAM 1 MG PO TABS
0.5000 mg | ORAL_TABLET | Freq: Once | ORAL | Status: AC
  Administered 2021-03-04: 0.5 mg via ORAL
  Filled 2021-03-04: qty 1

## 2021-03-04 MED ORDER — HEPARIN SODIUM (PORCINE) 5000 UNIT/ML IJ SOLN
5000.0000 [IU] | Freq: Three times a day (TID) | INTRAMUSCULAR | Status: DC
  Administered 2021-03-04 – 2021-03-05 (×3): 5000 [IU] via SUBCUTANEOUS
  Filled 2021-03-04 (×3): qty 1

## 2021-03-04 MED ORDER — INSULIN ASPART 100 UNIT/ML IJ SOLN
0.0000 [IU] | Freq: Three times a day (TID) | INTRAMUSCULAR | Status: DC
  Administered 2021-03-04: 2 [IU] via SUBCUTANEOUS
  Administered 2021-03-05: 3 [IU] via SUBCUTANEOUS

## 2021-03-04 MED ORDER — ATORVASTATIN CALCIUM 80 MG PO TABS
80.0000 mg | ORAL_TABLET | Freq: Every day | ORAL | Status: DC
  Administered 2021-03-04 – 2021-03-05 (×2): 80 mg via ORAL
  Filled 2021-03-04: qty 1
  Filled 2021-03-04: qty 2

## 2021-03-04 MED ORDER — SODIUM POLYSTYRENE SULFONATE 15 GM/60ML PO SUSP
30.0000 g | Freq: Once | ORAL | Status: AC
  Administered 2021-03-04: 30 g via ORAL
  Filled 2021-03-04: qty 120

## 2021-03-04 MED ORDER — ACETAMINOPHEN 650 MG RE SUPP: 650.0000 mg | Freq: Four times a day (QID) | RECTAL | Status: DC | PRN

## 2021-03-04 MED ORDER — ACETAMINOPHEN 325 MG PO TABS: 650.0000 mg | ORAL_TABLET | Freq: Four times a day (QID) | ORAL | Status: DC | PRN

## 2021-03-04 MED ORDER — TRAZODONE HCL 50 MG PO TABS
50.0000 mg | ORAL_TABLET | Freq: Every day | ORAL | Status: DC
  Administered 2021-03-04: 50 mg via ORAL
  Filled 2021-03-04: qty 1

## 2021-03-04 MED ORDER — INSULIN ASPART 100 UNIT/ML IV SOLN: 10.0000 [IU] | Freq: Once | INTRAVENOUS | Status: DC

## 2021-03-04 MED ORDER — ALBUTEROL SULFATE (2.5 MG/3ML) 0.083% IN NEBU
10.0000 mg | INHALATION_SOLUTION | Freq: Once | RESPIRATORY_TRACT | Status: AC
  Administered 2021-03-04: 10 mg via RESPIRATORY_TRACT
  Filled 2021-03-04: qty 12

## 2021-03-04 MED ORDER — SODIUM BICARBONATE 8.4 % IV SOLN
Freq: Once | INTRAVENOUS | Status: AC
  Filled 2021-03-04: qty 1000

## 2021-03-04 MED ORDER — INSULIN ASPART 100 UNIT/ML IV SOLN
10.0000 [IU] | Freq: Once | INTRAVENOUS | Status: AC
  Administered 2021-03-04: 10 [IU] via INTRAVENOUS

## 2021-03-04 MED ORDER — DEXTROSE 50 % IV SOLN: 1.0000 | Freq: Once | INTRAVENOUS | Status: DC

## 2021-03-04 MED ORDER — SODIUM ZIRCONIUM CYCLOSILICATE 10 G PO PACK
10.0000 g | PACK | Freq: Two times a day (BID) | ORAL | Status: DC
  Administered 2021-03-04: 10 g via ORAL
  Filled 2021-03-04 (×2): qty 1

## 2021-03-04 MED ORDER — STERILE WATER FOR INJECTION IV SOLN
INTRAVENOUS | Status: DC
  Filled 2021-03-04 (×2): qty 1000

## 2021-03-04 MED ORDER — ASPIRIN EC 81 MG PO TBEC
81.0000 mg | DELAYED_RELEASE_TABLET | Freq: Every day | ORAL | Status: DC
  Administered 2021-03-05: 81 mg via ORAL
  Filled 2021-03-04: qty 1

## 2021-03-04 MED ORDER — CALCIUM GLUCONATE 10 % IV SOLN
1.0000 g | Freq: Once | INTRAVENOUS | Status: AC
  Administered 2021-03-04: 1 g via INTRAVENOUS
  Filled 2021-03-04: qty 10

## 2021-03-04 MED ORDER — CITALOPRAM HYDROBROMIDE 20 MG PO TABS
20.0000 mg | ORAL_TABLET | Freq: Every day | ORAL | Status: DC
  Administered 2021-03-04 – 2021-03-05 (×2): 20 mg via ORAL
  Filled 2021-03-04: qty 1
  Filled 2021-03-04: qty 2

## 2021-03-04 MED ORDER — DEXTROSE 50 % IV SOLN
1.0000 | Freq: Once | INTRAVENOUS | Status: AC
  Administered 2021-03-04: 50 mL via INTRAVENOUS
  Filled 2021-03-04: qty 50

## 2021-03-04 MED ORDER — AMLODIPINE BESYLATE 10 MG PO TABS
10.0000 mg | ORAL_TABLET | Freq: Every day | ORAL | Status: DC
  Administered 2021-03-05: 10 mg via ORAL
  Filled 2021-03-04: qty 1

## 2021-03-04 MED ORDER — DEXTROSE 10 % IV SOLN: Freq: Once | INTRAVENOUS | Status: DC

## 2021-03-04 MED ORDER — SODIUM ZIRCONIUM CYCLOSILICATE 10 G PO PACK
10.0000 g | PACK | Freq: Once | ORAL | Status: AC
  Administered 2021-03-04: 10 g via ORAL
  Filled 2021-03-04: qty 1

## 2021-03-04 MED ORDER — SODIUM BICARBONATE 650 MG PO TABS
650.0000 mg | ORAL_TABLET | Freq: Three times a day (TID) | ORAL | Status: DC
  Administered 2021-03-04 – 2021-03-05 (×3): 650 mg via ORAL
  Filled 2021-03-04 (×3): qty 1

## 2021-03-04 NOTE — ED Notes (Signed)
Pt transported to US at this time. 

## 2021-03-04 NOTE — ED Notes (Signed)
Dr. Howie Ill notified of critical K+ 6.4.

## 2021-03-04 NOTE — ED Notes (Signed)
Got patient into a gown on the monitor did ekg shown to Dr Alvino Chapel patient is resting with family at bedside and call bell in reach

## 2021-03-04 NOTE — ED Notes (Signed)
Pt provided with 4oz soda for CBG 63. Tolerated well.
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
## 2021-03-04 NOTE — ED Provider Notes (Signed)
[facility] EMERGENCY DEPARTMENT Provider Note   CSN: [id number] Arrival date & time: 03/04/21  5465     History  No chief complaint on file.   Chelsea [name] is a 71 y.o. female with a past medical history significant for diabetes, CKD who presents after receiving a call for elevated potassium and to report to the emergency department.  Patient had been taking spironolactone, as well as losartan/hydrochlorothiazide, also reports decreased fluid intake.  Patient had seen the internal medicine resident clinic, was prescribed Lokelma, told to discontinue her spironolactone, started on sodium bicarb, and started on regular hydrochlorothiazide without losartan.  Patient had not begun these medication changes, however had discontinued her spironolactone prior to arrival.  Patient is not complaining of any chest pain, shortness of breath, dizziness, muscle cramping, weakness or any other complaints at arrival.  HPI     Home Medications Prior to Admission medications   Medication Sig Start Date End Date Taking? Authorizing Provider  Accu-Chek Softclix Lancets lancets Use to check blood sugar 2 times per day. 11/06/19   Trachelle Low, Rylee, MD  albuterol (VENTOLIN HFA) 108 (90 Base) MCG/ACT inhaler INHALE 2 PUFFS into lungs EVERY 6 HOURS AS NEEDED FOR WHEEZING OR SHORTNESS OF BREATH 02/14/21   Tyra Michelle, Rylee, MD  amLODipine (NORVASC) 10 MG tablet Take 1 tablet (10 mg total) by mouth daily. 02/14/21   Mitzi Hansen, MD  Aspirin 81 MG CAPS Take 81 mg by mouth daily. 02/14/21   Mitzi Hansen, MD  atorvastatin (LIPITOR) 80 MG tablet Take 1 tablet (80 mg total) by mouth daily. 02/14/21   Mitzi Hansen, MD  Blood Glucose Monitoring Suppl (ACCU-CHEK GUIDE ME) w/Device KIT Use to check blood sugar one time per day. 07/28/19   Lucious Groves, DO  citalopram (CELEXA) 20 MG tablet TAKE 1 TABLET BY MOUTH NIGHTLY AT BEDTIME 02/14/21   Bryn Perkin, Rylee, MD  fluticasone (FLONASE) 50 MCG/ACT  nasal spray Place 2 sprays into both nostrils daily. 02/14/21   Licia Harl, Rylee, MD  glucose blood (ACCU-CHEK GUIDE) test strip Use to check blood sugar two times per day. 10/14/20   Mitzi Hansen, MD  Lancets Misc. (ACCU-CHEK FASTCLIX LANCET) KIT Use to check blood sugar twice a day as discussed. 02/26/18   Bartholomew Crews, MD  liraglutide (VICTOZA) 18 MG/3ML SOPN inject 1.25m into THE SKIN EVERY DAY. 02/14/21   CMitzi Hansen MD  losartan-hydrochlorothiazide (HYZAAR) 100-25 MG tablet Take 1 tablet by mouth daily. 02/14/21   CMitzi Hansen MD  Patiromer Sorbitex Calcium (VELTASSA) 25.2 g PACK Take 1 packet by mouth daily at 6 (six) AM. 03/03/21   CMitzi Hansen MD  sodium bicarbonate 650 MG tablet Take 1 tablet (650 mg total) by mouth 3 (three) times daily. 03/03/21   CMitzi Hansen MD  spironolactone (ALDACTONE) 50 MG tablet Take 1 tablet (50 mg total) by mouth daily. 02/14/21   CMitzi Hansen MD  SURE COMFORT PEN NEEDLES 32G X 4 MM MISC Use once daily to inject victoza 11/14/20   Nazarene Bunning, Rylee, MD  traZODone (DESYREL) 50 MG tablet TAKE 1/2 TO 1 TABLET BY MOUTH NIGHTLY AT BEDTIME 02/14/21   CMitzi Hansen MD  Lancet Device MISC 1 each by Does not apply route 3 (three) times daily. 04/23/11 04/05/14  LCharlann Lange MD      Allergies    Atarax [hydroxyzine], Lisinopril, Nsaids, and Penicillins    Review of Systems   Review of Systems  All other systems reviewed and are negative.  Physical Exam  Updated Vital Signs BP (!) 158/63    Pulse 80    Temp 97.7 F (36.5 C)    Resp 17    Ht '5\' 5"'  (1.651 m)    Wt 84.8 kg    SpO2 100%    BMI 31.12 kg/m  Physical Exam Vitals and nursing note reviewed.  Constitutional:      General: She is not in acute distress.    Appearance: Normal appearance.  HENT:     Head: Normocephalic and atraumatic.  Eyes:     General:        Right eye: No discharge.        Left eye: No discharge.     Comments: Patient with significant entropion, right > left   Cardiovascular:     Rate and Rhythm: Normal rate and regular rhythm.     Heart sounds: No murmur heard.   No friction rub. No gallop.  Pulmonary:     Effort: Pulmonary effort is normal. No respiratory distress.     Breath sounds: Normal breath sounds.  Abdominal:     General: Bowel sounds are normal.     Palpations: Abdomen is soft.  Skin:    General: Skin is warm and dry.     Capillary Refill: Capillary refill takes less than 2 seconds.  Neurological:     Mental Status: She is alert and oriented to person, place, and time.  Psychiatric:        Mood and Affect: Mood normal.        Behavior: Behavior normal.    ED Results / Procedures / Treatments   Labs (all labs ordered are listed, but only abnormal results are displayed) Labs Reviewed  CBC WITH DIFFERENTIAL/PLATELET - Abnormal; Notable for the following components:      Result Value   RBC 2.98 (*)    Hemoglobin 9.0 (*)    HCT 28.7 (*)    All other components within normal limits  BASIC METABOLIC PANEL - Abnormal; Notable for the following components:   Potassium 6.2 (*)    Glucose, Bld 162 (*)    BUN 35 (*)    Creatinine, Ser 1.81 (*)    Calcium 8.8 (*)    GFR, Estimated 29 (*)    All other components within normal limits  RESP PANEL BY RT-PCR (FLU A&B, COVID) ARPGX2  BASIC METABOLIC PANEL  URINALYSIS, ROUTINE W REFLEX MICROSCOPIC  RETICULOCYTES  FERRITIN  IRON AND TIBC  BASIC METABOLIC PANEL    EKG EKG Interpretation  Date/Time:  Saturday March 04 2021 09:07:07 EST Ventricular Rate:  84 PR Interval:  202 QRS Duration: 89 QT Interval:  357 QTC Calculation: 422 R Axis:   40 Text Interpretation: Sinus rhythm Confirmed by Davonna Belling [phone]) on 03/04/2021 10:40:52 AM  Radiology No results found.  Procedures Procedures    Medications Ordered in ED Medications  calcium gluconate inj 10% (1 g) URGENT USE ONLY! (has no administration in time range)  albuterol (PROVENTIL) (2.5 MG/3ML) 0.083%  nebulizer solution 10 mg (has no administration in time range)  sodium bicarbonate 150 mEq in dextrose 5 % 1,150 mL infusion (has no administration in time range)  insulin aspart (novoLOG) injection 10 Units (has no administration in time range)    And  dextrose 50 % solution 50 mL (has no administration in time range)  Aspirin CAPS 81 mg (has no administration in time range)  amLODipine (NORVASC) tablet 10 mg (has no administration in time range)  atorvastatin (LIPITOR) tablet  80 mg (has no administration in time range)  citalopram (CELEXA) tablet 20 mg (has no administration in time range)  traZODone (DESYREL) tablet 50 mg (has no administration in time range)  sodium bicarbonate tablet 650 mg (has no administration in time range)  heparin injection 5,000 Units (has no administration in time range)  acetaminophen (TYLENOL) tablet 650 mg (has no administration in time range)    Or  acetaminophen (TYLENOL) suppository 650 mg (has no administration in time range)  LORazepam (ATIVAN) tablet 0.5 mg (has no administration in time range)  insulin aspart (novoLOG) injection 0-9 Units (has no administration in time range)  sodium zirconium cyclosilicate (LOKELMA) packet 10 g (10 g Oral Given 03/04/21 1200)    ED Course/ Medical Decision Making/ A&P Clinical Course as of 03/04/21 1209  Sat Mar 04, 2021  1110 Hyperkalemia stable, patient has not begun [facility] yet, elevated creatinine no EKG changes.  We will consult with internal medicine residency service further recommendations. [CP]  7408 IM Resident team to come down and speak with patient -- likely admit [CP]    Clinical Course User Index [CP] Anselmo Pickler, PA-C                           Medical Decision Making Amount and/or Complexity of Data Reviewed Labs: ordered. Radiology: ordered. ECG/medicine tests: ordered.  Risk Decision regarding hospitalization.   I discussed this case with my attending physician who cosigned  this note including patient's presenting symptoms, physical exam, and planned diagnostics and interventions. Attending physician stated agreement with plan or made changes to plan which were implemented.   This patient presents to the ED for concern of hyperkalemia, this involves an extensive number of treatment options, and is a complaint that carries with it a high risk of complications and morbidity. The emergent differential diagnosis includes, but is not limited to, hyperkalemia resulting in cardiac dysrhythmia, cardiac compromise, versus currently asymptomatic hyperkalemia.  We will also consider the mechanism for her potassium retention, at this time it seems related to decreased kidney function, spironolactone use, with some possible component of dehydration.  Patient not taking heparin, potassium has been elevated on multiple labs, does not seem to be secondary to hemolysis.  Co morbidities that complicate the patient evaluation: CKD, diabetes  Additional history obtained from daughter. External records from outside source obtained and reviewed including PCP, internal medicine resident clinic notes.  Physical Exam: Physical exam performed. The pertinent findings include: No pertinent findings related to hyperkalemia, normal heart rate and rhythm, no focal neurodeficits, no tenderness palpation chest wall, abdomen  Lab Tests: I Ordered, and personally interpreted labs.  The pertinent results include: BMP with potassium of 6.2, elevated creatinine 1.81 up from 1.592 days prior.  Patient also with significant anemia, hemoglobin of 9.  Elevated BUN of 35.   Cardiac Monitoring:  The patient was maintained on a cardiac monitor.  My attending physician Dr. Alvino Chapel viewed and interpreted the cardiac monitored which showed an underlying rhythm of: Sinus rhythm, some possible ST elevations in inferior leads, although no clearly ischemic pattern.  No evidence of T wave peaking, sinusoidal pattern, or  arrhythmia.  Consultations Obtained: I requested consultation with the internal medicine service, and discussed lab and imaging findings as well as pertinent plan - they recommend: Admission for potassium control, monitoring.  We also discussed outpatient treatment, but they believe patient would benefit remission at this time.   Disposition: After consideration of  the diagnostic results and the patients response to treatment, I feel that patient would benefit from admission to the internal medicine service, Dr. Heber Le [place] agrees to admission of this patient.  She remained stable at time of handoff.   Final Clinical Impression(s) / ED Diagnoses Final diagnoses:  AKI (acute kidney injury) [facility])    Rx / DC Orders ED Discharge Orders     None         Dorien Chihuahua 03/04/21 1220    Davonna Belling, MD 03/05/21 [phone]
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
## 2021-03-04 NOTE — ED Triage Notes (Signed)
Pt brought in with daughter stating that she was called by her PCP. Pt notified that her potassium is high. Pt arrives with no complaints, AOx4, denies pain at this time.

## 2021-03-04 NOTE — ED Notes (Signed)
RT notified of breathing tx ordered

## 2021-03-05 DIAGNOSIS — E1159 Type 2 diabetes mellitus with other circulatory complications: Secondary | ICD-10-CM

## 2021-03-05 DIAGNOSIS — E875 Hyperkalemia: Secondary | ICD-10-CM

## 2021-03-05 DIAGNOSIS — E113593 Type 2 diabetes mellitus with proliferative diabetic retinopathy without macular edema, bilateral: Secondary | ICD-10-CM

## 2021-03-05 DIAGNOSIS — N1832 Chronic kidney disease, stage 3b: Secondary | ICD-10-CM

## 2021-03-05 DIAGNOSIS — I152 Hypertension secondary to endocrine disorders: Secondary | ICD-10-CM

## 2021-03-05 DIAGNOSIS — E1122 Type 2 diabetes mellitus with diabetic chronic kidney disease: Secondary | ICD-10-CM

## 2021-03-05 DIAGNOSIS — Z794 Long term (current) use of insulin: Secondary | ICD-10-CM

## 2021-03-05 LAB — CBC
HCT: 26.3 % — ABNORMAL LOW (ref 36.0–46.0)
Hemoglobin: 8.1 g/dL — ABNORMAL LOW (ref 12.0–15.0)
MCH: 29.1 pg (ref 26.0–34.0)
MCHC: 30.8 g/dL (ref 30.0–36.0)
MCV: 94.6 fL (ref 80.0–100.0)
Platelets: 234 10*3/uL (ref 150–400)
RBC: 2.78 MIL/uL — ABNORMAL LOW (ref 3.87–5.11)
RDW: 13.3 % (ref 11.5–15.5)
WBC: 7.4 10*3/uL (ref 4.0–10.5)
nRBC: 0 % (ref 0.0–0.2)

## 2021-03-05 LAB — GLUCOSE, CAPILLARY
Glucose-Capillary: 106 mg/dL — ABNORMAL HIGH (ref 70–99)
Glucose-Capillary: 211 mg/dL — ABNORMAL HIGH (ref 70–99)

## 2021-03-05 LAB — BASIC METABOLIC PANEL
Anion gap: 8 (ref 5–15)
BUN: 31 mg/dL — ABNORMAL HIGH (ref 8–23)
CO2: 25 mmol/L (ref 22–32)
Calcium: 8.8 mg/dL — ABNORMAL LOW (ref 8.9–10.3)
Chloride: 108 mmol/L (ref 98–111)
Creatinine, Ser: 1.38 mg/dL — ABNORMAL HIGH (ref 0.44–1.00)
GFR, Estimated: 41 mL/min — ABNORMAL LOW (ref >60)
Glucose, Bld: 104 mg/dL — ABNORMAL HIGH (ref 70–99)
Potassium: 4.7 mmol/L (ref 3.5–5.1)
Sodium: 141 mmol/L (ref 135–145)

## 2021-03-05 LAB — MRSA NEXT GEN BY PCR, NASAL: MRSA by PCR Next Gen: NOT DETECTED

## 2021-03-05 LAB — VITAMIN B12: Vitamin B-12: 485 pg/mL (ref 180–914)

## 2021-03-05 LAB — FOLATE: Folate: 21.7 ng/mL (ref >5.9)

## 2021-03-05 MED ORDER — CARVEDILOL 3.125 MG PO TABS
3.1250 mg | ORAL_TABLET | Freq: Two times a day (BID) | ORAL | 0 refills | Status: DC
Start: 2021-03-05 — End: 2021-04-06

## 2021-03-05 MED ORDER — HYDROCHLOROTHIAZIDE 25 MG PO TABS: 25.0000 mg | ORAL_TABLET | Freq: Every day | ORAL | 0 refills | Status: DC

## 2021-03-05 NOTE — Progress Notes (Signed)
Discharge instructions reviewed with patient and her daughter Dalanie Kisner utilizing teach back method no questions at this time. Patient discharged to home.

## 2021-03-05 NOTE — Progress Notes (Signed)
[facility] Progress Note  Subjective: seen in the room, in good spirits, K+ 4.7 and creat down 1.3  Vitals:   03/05/21 0200 03/05/21 0300 03/05/21 0400 03/05/21 0726  BP: (!) 141/54  (!) 146/71 (!) 145/59  Pulse: 86 86 89 86  Resp: 13 (!) '24 19 17  ' Temp:   98.1 F (36.7 C) 98.5 F (36.9 C)  TempSrc:   Oral Oral  SpO2: 97% 99% 99% 100%  Weight:   81.8 kg   Height:        Exam: Gen alert, elderly AAF , pleasant No jvd or bruits Chest clear bilat to bases RRR no MRG Abd soft ntnd no mass or ascites +bs Ext no LE or UE edema Neuro is alert, Ox 3 , nf      Home meds include - norvasc, asa, lipitor, liraglutide, trazodone, celexa, losartan/ hctz 100-25 qd, sod bicarb, aldactone (quit 2 wks ago)     UA negative    Renal US - 9.7/ 10.8 cm kidneys w/o hydro, bilat cysts    EKG 1/21 - NSR, no peaked T's or QRS widening    Na 138  K 6.4  CO2 22 BUN 35  Cr 1.73   Ca 8.7  Hb 9.0             Assessment/ Plan: Hyperkalemia - started having occasional high K+ labs in 2020, now has been happening more frequently.  Aldactone and losartan are both contributing, also possibly type IV RTA from her DM2.  No EKG changes. K+ corrected w/ kayexalate/ lokelma, renal diet, bicarb IVF's and holding losartan/ aldactone (aldactone has been on hold 1-2 wks). Recommend to avoid acei/ ARB and aldactone going forward in this patient due to risk of life-threatening hyperkalemia. HCTZ is okay. Substitute other meds as needed for HTN treatment. No other suggestions. Will sign off.  CKD 3b - b/l creat 1.4- 1.8 from 2002- 23, eGFR 29- 41 ml/min.  Creat at baseline, stable. Renal US w/o obstruction and UA negative.  HTN - BP's good, on norvasc, euvolemic, cont home meds DM2 - per pmd Depression          Chelsea [name] 03/05/2021, 9:42 AM   Recent Labs  Lab 03/02/21 0938 03/04/21 0929 03/04/21 1151 03/04/21 1643 03/05/21 0031  K 6.3* 6.2*   < > 5.2* 4.7  BUN 39* 35*   < > 35* 31*   CREATININE 1.59* 1.81*   < > 1.67* 1.38*  CALCIUM 9.1 8.8*   < > 9.0 8.8*  PHOS 4.8*  --   --   --   --   HGB  --  9.0*  --   --  8.1*   < > = values in this interval not displayed.   Inpatient medications:  amLODipine  10 mg Oral Daily   aspirin EC  81 mg Oral Daily   atorvastatin  80 mg Oral Daily   citalopram  20 mg Oral Daily   heparin  5,000 Units Subcutaneous Q8H   insulin aspart  0-9 Units Subcutaneous TID WC   sodium bicarbonate  650 mg Oral TID   sodium zirconium cyclosilicate  10 g Oral BID   traZODone  50 mg Oral QHS     sodium bicarbonate (isotonic) infusion in sterile water Stopped (03/05/21 0800)   acetaminophen **OR** acetaminophen

## 2021-03-05 NOTE — Discharge Summary (Addendum)
Name: Chelsea [name] MRN: [id number] DOB: 22-Jan-1951 71 y.o. PCP: Mitzi Hansen, MD  Date of Admission: 03/04/2021  8:57 AM Date of Discharge: 03/05/21 Attending Physician: Lucious Groves, DO  Discharge Diagnosis: 1.  Hyperkalemia 2.  CKD 3 3.  Normocytic anemia 4.  Hypertension  Discharge Medications: Allergies as of 03/05/2021       Reactions   Ace Inhibitors Other (See Comments)   Severe hyperkalemia Jan 2023 from ARB, assumed ACEi would have same effect   Aldactone [spironolactone] Other (See Comments)   Severe hyperkalemia Jan 2023   Angiotensin Receptor Blockers Other (See Comments)   Severe hyperkalemia Jan 2023   Atarax [hydroxyzine]    Hallucination   Lisinopril Cough   Nsaids Nausea And Vomiting   Penicillins Other (See Comments)   shaking        Medication List     STOP taking these medications    losartan-hydrochlorothiazide 100-25 MG tablet Commonly known as: HYZAAR   spironolactone 50 MG tablet Commonly known as: ALDACTONE       TAKE these medications    Accu-Chek FastClix Lancet Kit Use to check blood sugar twice a day as discussed.   Accu-Chek Guide Me w/Device Kit Use to check blood sugar one time per day.   Accu-Chek Guide test strip Generic drug: glucose blood Use to check blood sugar two times per day.   Accu-Chek Softclix Lancets lancets Use to check blood sugar 2 times per day.   albuterol 108 (90 Base) MCG/ACT inhaler Commonly known as: Ventolin HFA INHALE 2 PUFFS into lungs EVERY 6 HOURS AS NEEDED FOR WHEEZING OR SHORTNESS OF BREATH   amLODipine 10 MG tablet Commonly known as: NORVASC Take 1 tablet (10 mg total) by mouth daily.   Aspirin 81 MG Caps Take 81 mg by mouth daily.   atorvastatin 80 MG tablet Commonly known as: LIPITOR Take 1 tablet (80 mg total) by mouth daily.   carvedilol 3.125 MG tablet Commonly known as: Coreg Take 1 tablet (3.125 mg total) by mouth 2 (two) times daily.   citalopram 20 MG  tablet Commonly known as: CELEXA TAKE 1 TABLET BY MOUTH NIGHTLY AT BEDTIME   fluticasone 50 MCG/ACT nasal spray Commonly known as: FLONASE Place 2 sprays into both nostrils daily. What changed:  how much to take when to take this reasons to take this   hydrochlorothiazide 25 MG tablet Commonly known as: HYDRODIURIL Take 1 tablet (25 mg total) by mouth daily.   multivitamin with minerals Tabs tablet Take 1 tablet by mouth daily.   sodium bicarbonate 650 MG tablet Take 1 tablet (650 mg total) by mouth 3 (three) times daily.   Sure Comfort Pen Needles 32G X 4 MM Misc Generic drug: Insulin Pen Needle Use once daily to inject victoza   traZODone 50 MG tablet Commonly known as: DESYREL TAKE 1/2 TO 1 TABLET BY MOUTH NIGHTLY AT BEDTIME What changed:  how much to take how to take this when to take this   Veltassa 25.2 g Pack Generic drug: Patiromer Sorbitex Calcium Take 1 packet by mouth daily at 6 (six) AM.   Victoza 18 MG/3ML Sopn Generic drug: liraglutide inject 1.72m into THE SKIN EVERY DAY. What changed:  how much to take when to take this additional instructions        Disposition and follow-up:   [facility]ed[facility]  At the hospital follow up visit please address:  1.  Hyperkalemia CKD 3 Hypertension  Hypokalemia thought secondary to ACE/ARB/spironolactone and possibly type IV RTA.  Potassium normalized at 4.7 at discharge.  We stopped spironolactone and losartan-HCTZ.  We started Coreg and HCTZ. -Repeat BMP in 1 week -Recheck blood pressure.   Normocytic anemia Likely secondary to anemia of chronic disease -Repeat CBC  2.  Labs / imaging needed at time of follow-up: CBC, BMP  3.  Pending labs/ test needing follow-up: NA  Follow-up Appointments:  [facility] in 1 week  Hospital Course by problem list:  Hyperkalemia CKD stage III Patient was seen in the [facility] clinic on 1/3.  Her  potassium was 5.8 at that time and she was advised to hold spironolactone and given 1 dose of Lokelma.  Repeat BMP on 1/19 showed worsening of potassium to 6.3.  She was asked to hold losartan-HCTZ and was started on Veltassa daily.  She was advised to go to the ED to have an EKG.  In the ED, initial EKG showed PR interval of 202.  Repeat EKG showed PR interval of 283 with heart rate 88.  Repeat BMP showed potassium 6.4.  With a change in PR interval, she was treated for hyperkalemic emergency with insulin/dextrose, bicarb, albuterol, calcium gluconate and Lokelma.  Nephrology was consulted and recommended Lokelma 10 g twice daily for 2 days and Kayexalate.  Renal ultrasound and UA were unremarkable.  Her hyperkalemia is thought secondary to contributing ACE/ARB/spironolactone.  Also possibly type IV RTA with low bicarb on admission.  We held her losartan-HCTZ on admission.  Potassium normalizes at 4.7 the next day.  We will stop spironolactone and losartan-HCTZ.  We will start her on Coreg 3.125 mg BID and HCTZ 25 mg daily for blood pressure control.  She will follow-[facility] with [facility] in 1 week for repeat BMP and blood pressure recheck.  Normocytic hypoproliferative anemia Her hemoglobin on admission was 9, down from 11 in September.  Hemoglobin continues downtrending to 8.1 the next day.  Absolute reticulocyte count is suggest hypoproliferative.  Ferritin of 83 and iron saturation 30% suggest against iron deficiency anemia.  Patient denies any symptoms of GI bleed.  This is likely anemia of chronic disease.  She will have a repeat CBC in 1 week.  HPI Patient is seen at bedside with daughter this morning.  She appears comfortable in no acute distress.  She reports a good night sleep.  She denies any new issues or complaints.  She is very happy to go home today.  Discharge Exam:   BP 130/66    Pulse 86    Temp 98.5 F (36.9 C) (Oral)    Resp 17    Ht '5\' 5"'  (1.651 m)    Wt 81.8 kg    SpO2 100%    BMI 30.01  kg/m  Discharge exam:  Physical Exam Constitutional:      General: She is not in acute distress. Eyes:     General:        Right eye: No discharge.        Left eye: No discharge.     Conjunctiva/sclera: Conjunctivae normal.  Cardiovascular:     Rate and Rhythm: Normal rate and regular rhythm.     Heart sounds: Normal heart sounds.  Pulmonary:     Effort: Pulmonary effort is normal. No respiratory distress.  Abdominal:     General: Bowel sounds are normal. There is no distension.     Palpations: Abdomen is soft.  Skin:    General: Skin  is warm.  Neurological:     Mental Status: She is alert and oriented to person, place, and time.  Psychiatric:        Mood and Affect: Mood normal.        Behavior: Behavior normal.     Pertinent Labs, Studies, and Procedures:  CBC Latest Ref Rng & Units 03/05/2021 03/04/2021 10/26/2020  WBC 4.0 - 10.5 K/uL 7.4 5.9 7.2  Hemoglobin 12.0 - 15.0 g/dL 8.1(L) 9.0(L) 11.2  Hematocrit 36.0 - 46.0 % 26.3(L) 28.7(L) 34.3  Platelets 150 - 400 K/uL 234 248 271   CMP Latest Ref Rng & Units 03/05/2021 03/04/2021 03/04/2021  Glucose 70 - 99 mg/dL 104(H) - 148(H)  BUN 8 - 23 mg/dL 31(H) - 35(H)  Creatinine 0.44 - 1.00 mg/dL 1.38(H) - 1.67(H)  Sodium 135 - 145 mmol/L 141 - 138  Potassium 3.5 - 5.1 mmol/L 4.7 - 5.2(H)  Chloride 98 - 111 mmol/L 108 - 110  CO2 22 - 32 mmol/L 25 - 19(L)  Calcium 8.9 - 10.3 mg/dL 8.8(L) - 9.0  Total Protein 6.5 - 8.1 g/dL - 6.7 -  Total Bilirubin 0.3 - 1.2 mg/dL - 0.3 -  Alkaline Phos 38 - 126 U/L - 84 -  AST 15 - 41 U/L - 21 -  ALT 0 - 44 U/L - 21 -    US RENAL  Result Date: 03/04/2021 CLINICAL DATA:  71 year old female with acute kidney injury. EXAM: RENAL / URINARY TRACT ULTRASOUND COMPLETE COMPARISON:  10/21/2006 ultrasound FINDINGS: Right Kidney: Renal measurements: 9.7 x 4.7 x 4.7 = volume: 113 mL. Renal echogenicity is slightly increased. Multiple cysts are identified (less than 10), the largest measuring 2.4 cm within  the mid kidney. No solid mass or hydronephrosis visualized. Left Kidney: Renal measurements: 10.8 x 5.8 x 5.2 cm = volume: 170 mL. Renal echogenicity is slightly increased. Multiple cysts are present (less than 10), the largest measuring 2.8 cm within the mid kidney. No solid mass or hydronephrosis visualized. Bladder: Appears normal for degree of bladder distention. Other: Incidental note is made of multiple mobile gallstones without sonographic evidence of acute cholecystitis. IMPRESSION: 1. Slightly increased renal echogenicity bilaterally compatible with medical renal disease. No evidence of hydronephrosis. 2. Multiple bilateral renal cysts without evidence of solid renal mass. 3. Cholelithiasis. Electronically Signed   By: Margarette Canada M.D.   On: 03/04/2021 13:39   Discharge Instructions: Discharge Instructions     Call MD for:  persistant nausea and vomiting   Complete by: As directed    Call MD for:  severe uncontrolled pain   Complete by: As directed    Diet - low sodium heart healthy   Complete by: As directed    Discharge instructions   Complete by: As directed    Ms. Joaquin,  It was a pleasure taking care of you during this admission.  You were admitted for a very high level of potassium.  You were given medications to lower the potassium level.  We will adjust some of your medications: -STOP Spironolactone -STOP Losartan-HCTZ -START Hydrochlorothiazide 25 mg daily  -START Carvedilol (Coreg) 3.125 mg twice daily   You will follow up with the internal medicine clinic in 1 week for repeat blood work and blood pressure recheck.  Take care,  Dr. Alfonse Spruce   Increase activity slowly   Complete by: As directed        Signed: Gaylan Gerold, DO 03/05/2021, 10:37 AM   Pager: [phone]

## 2021-03-06 ENCOUNTER — Telehealth: Payer: Self-pay | Admitting: *Deleted

## 2021-03-06 LAB — HEMOGLOBIN A1C
Hgb A1c MFr Bld: 6.6 % — ABNORMAL HIGH (ref 4.8–5.6)
Mean Plasma Glucose: 143 mg/dL

## 2021-03-06 NOTE — Progress Notes (Signed)
Internal Medicine Clinic Attending  Case discussed with Dr. Christian  At the time of the visit.  We reviewed the resident's history and exam and pertinent patient test results.  I agree with the assessment, diagnosis, and plan of care documented in the resident's note.  

## 2021-03-06 NOTE — Telephone Encounter (Signed)
Received call from [place], [place] with [facility]. Requesting VO for [facility] PT 2 week 8 to work on strength, gait, and transfers. Verbal auth given. Will route to PCP for agreement/denial.

## 2021-03-06 NOTE — Telephone Encounter (Signed)
Agree 

## 2021-03-07 ENCOUNTER — Ambulatory Visit: Payer: Medicare Other | Admitting: Behavioral Health

## 2021-03-07 DIAGNOSIS — F419 Anxiety disorder, unspecified: Secondary | ICD-10-CM

## 2021-03-07 DIAGNOSIS — F331 Major depressive disorder, recurrent, moderate: Secondary | ICD-10-CM

## 2021-03-07 NOTE — BH Specialist Note (Signed)
Integrated Behavioral Health via Telemedicine Visit  03/07/2021 BEOLA VASALLO [id number]  Number of [place] visits: 1/6 Session Start time: 2:10pm  Session End time: 2:40pm Total time: 30  Referring Provider: Dr. Verlin Dike, MD Patient/Family location: Pt is home in private [facility] Provider location: [facility] Office All persons participating in visit: Pt & Clinician Types of Service: Individual psychotherapy  I connected with [name] and/or [place]  self  via  Telephone or Video Enabled Telemedicine Application  (Video is Caregility application) and verified that I am speaking with the correct person using two identifiers. Discussed confidentiality: Yes   I discussed the limitations of telemedicine and the availability of in person appointments.  Discussed there is a possibility of technology failure and discussed alternative modes of communication if that failure occurs.  I discussed that engaging in this telemedicine visit, they consent to the provision of behavioral healthcare and the services will be billed under their insurance.  Patient and/or legal guardian expressed understanding and consented to Telemedicine visit: Yes   Presenting Concerns: Patient and/or family reports the following symptoms/concerns: elevated anx/dep Duration of problem: since her CVA; Severity of problem: mild  Patient and/or Family's Strengths/Protective Factors: Social connections, Social and Emotional competence, Concrete supports in place (healthy food, safe environments, etc.), and Sense of purpose  Goals Addressed: Patient will:  Reduce symptoms of: anxiety and depression   Increase knowledge and/or ability of: coping skills and healthy habits   Demonstrate ability to: Increase healthy adjustment to current life circumstances  Progress towards Goals: Estb'd today; Pt will attend future sessions every few wks for several months for mental health wellness  support  Interventions: Interventions utilized:  Supportive Counseling Standardized Assessments completed:  screeners prn  Patient and/or Family Response: Pt receptive to call today & future mental health wellness support calls  Assessment: Patient currently experiencing some difficulties w/speech due to her CVA 3 yrs ago. In the past week, Pt was admitted to the hosp for Tx of elevated proteins. Her Dtr accompanied her to the [place] helped out a great deal so Pt could manage the overnight stay.  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 She cannot actively cook now & her 78 Gchildren miss this!  Pt is trying to face the challenge of aging; she does not want to fall & is careful on her home's 4 steep steps. Pt reports having a PT appt this afternoon to assist w/her ambulation issues.  Patient may benefit from several calls/month for mental health wellness support & adjustment to aging.  Plan: Follow up with behavioral health clinician on : 2-3 wks for 30 min on telehealth Behavioral recommendations: None today as this was an Intro appt Referral(s): [place] (In Clinic)  I discussed the assessment and treatment plan with the patient and/or parent/guardian. They were provided an opportunity to ask questions and all were answered. They agreed with the plan and demonstrated an understanding of the instructions.   They were advised to call back or seek an in-person evaluation if the symptoms worsen or if the condition fails to improve as anticipated.  Donnetta Hutching, LMFT

## 2021-03-09 ENCOUNTER — Telehealth: Payer: Self-pay | Admitting: *Deleted

## 2021-03-09 NOTE — Telephone Encounter (Signed)
Agree  Thank you

## 2021-03-09 NOTE — Telephone Encounter (Signed)
Patient called in stating she has taken 3 doses of Valtassa and she has not had a BM in 2 days. Discussed increasing fluid, fiber, and physical activity. She will continue taking packets and call back if constipation continues.

## 2021-03-13 ENCOUNTER — Encounter (HOSPITAL_COMMUNITY): Payer: Self-pay

## 2021-03-13 ENCOUNTER — Other Ambulatory Visit: Payer: Self-pay

## 2021-03-13 ENCOUNTER — Emergency Department (HOSPITAL_COMMUNITY)
Admission: EM | Admit: 2021-03-13 | Discharge: 2021-03-13 | Disposition: A | Discharge status: Home / Self Care | Payer: Medicare Other | Attending: Emergency Medicine | Admitting: Emergency Medicine

## 2021-03-13 DIAGNOSIS — R1084 Generalized abdominal pain: Secondary | ICD-10-CM | POA: Diagnosis not present

## 2021-03-13 DIAGNOSIS — Z7982 Long term (current) use of aspirin: Secondary | ICD-10-CM | POA: Diagnosis not present

## 2021-03-13 DIAGNOSIS — K5900 Constipation, unspecified: Secondary | ICD-10-CM | POA: Diagnosis not present

## 2021-03-13 DIAGNOSIS — Z79899 Other long term (current) drug therapy: Secondary | ICD-10-CM | POA: Insufficient documentation

## 2021-03-13 DIAGNOSIS — E119 Type 2 diabetes mellitus without complications: Secondary | ICD-10-CM | POA: Insufficient documentation

## 2021-03-13 NOTE — ED Provider Notes (Signed)
[facility] EMERGENCY DEPARTMENT Provider Note   CSN: [id number] Arrival date & time: 03/13/21  2094     History  Chief Complaint  Patient presents with   Abdominal Pain    Chelsea [name] is a 71 y.o. female.  71 year old female presents with complaint of constipation.  Patient states her last gratifying bowel movement was on Thursday, since that time has only passed liquid or very small stool balls into her fever.  She denies difficulty breathing, nausea, vomiting, abdominal pain, fevers, chills or any other complaints or concerns.  She has tried warm prune juice and milk of magnesia at home.      Home Medications Prior to Admission medications   Medication Sig Start Date End Date Taking? Authorizing Provider  Accu-Chek Softclix Lancets lancets Use to check blood sugar 2 times per day. 11/06/19   Christian, Rylee, MD  albuterol (VENTOLIN HFA) 108 (90 Base) MCG/ACT inhaler INHALE 2 PUFFS into lungs EVERY 6 HOURS AS NEEDED FOR WHEEZING OR SHORTNESS OF BREATH Patient not taking: Reported on 03/04/2021 02/14/21   Mitzi Hansen, MD  amLODipine (NORVASC) 10 MG tablet Take 1 tablet (10 mg total) by mouth daily. 02/14/21   Mitzi Hansen, MD  Aspirin 81 MG CAPS Take 81 mg by mouth daily. 02/14/21   Mitzi Hansen, MD  atorvastatin (LIPITOR) 80 MG tablet Take 1 tablet (80 mg total) by mouth daily. 02/14/21   Mitzi Hansen, MD  Blood Glucose Monitoring Suppl (ACCU-CHEK GUIDE ME) w/Device KIT Use to check blood sugar one time per day. 07/28/19   Lucious Groves, DO  carvedilol (COREG) 3.125 MG tablet Take 1 tablet (3.125 mg total) by mouth 2 (two) times daily. 03/05/21 04/04/21  Gaylan Gerold, DO  citalopram (CELEXA) 20 MG tablet TAKE 1 TABLET BY MOUTH NIGHTLY AT BEDTIME Patient not taking: Reported on 03/04/2021 02/14/21   Mitzi Hansen, MD  fluticasone (FLONASE) 50 MCG/ACT nasal spray Place 2 sprays into both nostrils daily. Patient taking differently: Place 1 spray into  both nostrils daily as needed for allergies. 02/14/21   Christian, Rylee, MD  glucose blood (ACCU-CHEK GUIDE) test strip Use to check blood sugar two times per day. 10/14/20   Mitzi Hansen, MD  hydrochlorothiazide (HYDRODIURIL) 25 MG tablet Take 1 tablet (25 mg total) by mouth daily. 03/05/21 04/04/21  Gaylan Gerold, DO  Lancets Misc. (ACCU-CHEK FASTCLIX LANCET) KIT Use to check blood sugar twice a day as discussed. 02/26/18   Bartholomew Crews, MD  liraglutide (VICTOZA) 18 MG/3ML SOPN inject 1.74m into THE SKIN EVERY DAY. Patient taking differently: 1.2 mg daily. 02/14/21   CMitzi Hansen MD  Multiple Vitamin (MULTIVITAMIN WITH MINERALS) TABS tablet Take 1 tablet by mouth daily.    [provider]  Patiromer Sorbitex Calcium (VELTASSA) 25.2 g PACK Take 1 packet by mouth daily at 6 (six) AM. Patient not taking: Reported on 03/04/2021 03/03/21   CMitzi Hansen MD  sodium bicarbonate 650 MG tablet Take 1 tablet (650 mg total) by mouth 3 (three) times daily. Patient not taking: Reported on 03/04/2021 03/03/21   CMitzi Hansen MD  SURE COMFORT PEN NEEDLES 32G X 4 MM MISC Use once daily to inject victoza 11/14/20   Christian, Rylee, MD  traZODone (DESYREL) 50 MG tablet TAKE 1/2 TO 1 TABLET BY MOUTH NIGHTLY AT BEDTIME Patient taking differently: Take 50 mg by mouth at bedtime. TAKE 1/2 TO 1 TABLET BY MOUTH NIGHTLY AT BEDTIME 02/14/21   CMitzi Hansen MD  Lancet Device MISC  1 each by Does not apply route 3 (three) times daily. 04/23/11 04/05/14  Charlann Lange, MD      Allergies    Ace inhibitors, Aldactone [spironolactone], Angiotensin receptor blockers, Atarax [hydroxyzine], Lisinopril, Nsaids, and Penicillins    Review of Systems   Review of Systems  Constitutional:  Negative for fever.  Respiratory:  Negative for shortness of breath.   Cardiovascular:  Negative for chest pain.  Gastrointestinal:  Positive for constipation. Negative for abdominal pain, blood in stool, nausea and vomiting.   Genitourinary:  Negative for decreased urine volume and difficulty urinating.  Musculoskeletal:  Negative for arthralgias and myalgias.  Skin:  Negative for wound.  Hematological:  Does not bruise/bleed easily.   Physical Exam Updated Vital Signs BP (!) 150/51    Pulse 84    Temp 98.9 F (37.2 C) (Oral)    Resp 18    Ht 5' 5" (1.651 m)    Wt 78.5 kg    SpO2 100%    BMI 28.79 kg/m  Physical Exam Vitals and nursing note reviewed. Exam conducted with a chaperone present.  Constitutional:      General: She is not in acute distress.    Appearance: She is well-developed. She is not diaphoretic.  HENT:     Head: Normocephalic and atraumatic.  Cardiovascular:     Rate and Rhythm: Normal rate and regular rhythm.     Heart sounds: Normal heart sounds.  Pulmonary:     Effort: Pulmonary effort is normal.     Breath sounds: Normal breath sounds.  Abdominal:     General: Bowel sounds are normal.     Palpations: Abdomen is soft.     Tenderness: There is no abdominal tenderness.  Genitourinary:    Rectum: No mass, tenderness or external hemorrhoid. Normal anal tone.  Skin:    General: Skin is warm and dry.     Findings: No erythema or rash.  Neurological:     Mental Status: She is alert and oriented to person, place, and time.  Psychiatric:        Behavior: Behavior normal.    ED Results / Procedures / Treatments   Labs (all labs ordered are listed, but only abnormal results are displayed) Labs Reviewed - No data to display  EKG None  Radiology No results found.  Procedures Fecal disimpaction  Date/Time: 03/13/2021 10:14 AM Performed by: Tacy Learn, PA-C Authorized by: Tacy Learn, PA-C  Consent: Verbal consent obtained. Risks and benefits: risks, benefits and alternatives were discussed Consent given by: patient Patient identity confirmed: verbally with patient Local anesthesia used: no  Anesthesia: Local anesthesia used: no  Sedation: Patient sedated:  no  Patient tolerance: patient tolerated the procedure well with no immediate complications      Medications Ordered in ED Medications - No data to display  ED Course/ Medical Decision Making/ A&P                           Medical Decision Making  71 year old female with history of diabetes, prior stroke and prior GI bleed presents via EMS from home with complaint of constipation.  On assessment, denies abdominal pain, chest pain, shortness of breath, difficulty voiding.  States that she sleeps in a paper, has been taking milk of magnesia and warm prune juice and passing liquid stool and small firm stools however not having any relief from her fecal urgency.  On exam, abdomen soft and nontender,  bowel sounds are normal.  Chaperone present for rectal exam and patient consents to disimpaction.  Stool removed from rectal vault.  Patient declines further treatment with enema or glycerin suppository.  She would like to go home to try MiraLAX, Colace, return as needed or recheck with PCP.  Patient's daughter is present with her in the department and assists with history today.        Final Clinical Impression(s) / ED Diagnoses Final diagnoses:  Constipation, unspecified constipation type    Rx / DC Orders ED Discharge Orders     None         Tacy Learn, PA-C 03/13/21 1028    Wynona Dove A, DO 03/13/21 1728

## 2021-03-13 NOTE — Discharge Instructions (Signed)
MiraLAX as discussed.  You could also try Dulcolax as a stool softener or use a glycerin suppository.  Follow-up with your doctor if symptoms persist.  Return to the emergency room for worsening or concerning symptoms.

## 2021-03-13 NOTE — ED Triage Notes (Signed)
Pt bib ems from home c/o abdomen pain d/t constipation. Pt states she hasn't had a BM since Thursday. Pt denies N/V and CP.

## 2021-03-14 ENCOUNTER — Telehealth: Payer: Medicare Other | Admitting: Internal Medicine

## 2021-03-14 ENCOUNTER — Telehealth: Payer: Self-pay | Admitting: Internal Medicine

## 2021-03-14 NOTE — Telephone Encounter (Signed)
Pt had been scheduled for a telehealth visit today, for blood pressure follow up from her visit with me on 1/3. Since then, she was admitted to the hospital for hyperkalemia and was discharged with several medication changes. Additionally, she was seen in the ED yesterday, 1/30, for constipation.   I spoke with her daughter, [name], and recommended that we change her visit to an in-person office visit so we can check her blood pressure and obtain labs. [name] is not able to bring her in today, but will be able to later this week.   I will have the front desk cancel her telehealth visit with me today and call [name] to arrange follow up for later this week.  Mitzi Hansen, MD

## 2021-03-15 ENCOUNTER — Ambulatory Visit: Payer: Medicare Other

## 2021-03-15 NOTE — Chronic Care Management (AMB) (Signed)
Care Management    RN Visit Note  03/15/2021 Name: Chelsea [name] MRN: [id number] DOB: 09-05-1950  Subjective: Chelsea [name] is a 71 y.o. year old female who is a primary care patient of Christian, Lucinda Dell, MD. The care management team was consulted for assistance with disease management and care coordination needs.    Engaged with patient by telephone for follow up visit in response to provider referral for case management and/or care coordination services.   Consent to Services:   Chelsea [name] was given information about Care Management services today including:  Care Management services includes personalized support from designated clinical staff supervised by her physician, including individualized plan of care and coordination with other care providers 24/7 contact phone numbers for assistance for urgent and routine care needs. The patient may stop case management services at any time by phone call to the office staff.  Patient agreed to services and consent obtained.   Assessment: Review of patient past medical history, allergies, medications, health status, including review of consultants reports, laboratory and other test data, was performed as part of comprehensive evaluation and provision of chronic care management services.   SDOH (Social Determinants of Health) assessments and interventions performed:    Care Plan  Allergies  Allergen Reactions   Ace Inhibitors Other (See Comments)    Severe hyperkalemia Jan 2023 from [facility], assumed ACEi would have same effect   Aldactone [Spironolactone] Other (See Comments)    Severe hyperkalemia Jan 2023   Angiotensin Receptor Blockers Other (See Comments)    Severe hyperkalemia Jan 2023   Atarax [Hydroxyzine]     Hallucination    Lisinopril Cough   Nsaids Nausea And Vomiting   Penicillins Other (See Comments)    shaking    Outpatient Encounter Medications as of 03/15/2021  Medication Sig   Accu-Chek Softclix Lancets lancets Use  to check blood sugar 2 times per day.   albuterol (VENTOLIN HFA) 108 (90 Base) MCG/ACT inhaler INHALE 2 PUFFS into lungs EVERY 6 HOURS AS NEEDED FOR WHEEZING OR SHORTNESS OF BREATH (Patient not taking: Reported on 03/04/2021)   amLODipine (NORVASC) 10 MG tablet Take 1 tablet (10 mg total) by mouth daily.   Aspirin 81 MG CAPS Take 81 mg by mouth daily.   atorvastatin (LIPITOR) 80 MG tablet Take 1 tablet (80 mg total) by mouth daily.   Blood Glucose Monitoring Suppl (ACCU-CHEK GUIDE ME) w/Device KIT Use to check blood sugar one time per day.   carvedilol (COREG) 3.125 MG tablet Take 1 tablet (3.125 mg total) by mouth 2 (two) times daily.   citalopram (CELEXA) 20 MG tablet TAKE 1 TABLET BY MOUTH NIGHTLY AT BEDTIME (Patient not taking: Reported on 03/04/2021)   fluticasone (FLONASE) 50 MCG/ACT nasal spray Place 2 sprays into both nostrils daily. (Patient taking differently: Place 1 spray into both nostrils daily as needed for allergies.)   glucose blood (ACCU-CHEK GUIDE) test strip Use to check blood sugar two times per day.   hydrochlorothiazide (HYDRODIURIL) 25 MG tablet Take 1 tablet (25 mg total) by mouth daily.   Lancets Misc. (ACCU-CHEK FASTCLIX LANCET) KIT Use to check blood sugar twice a day as discussed.   liraglutide (VICTOZA) 18 MG/3ML SOPN inject 1.38m into THE SKIN EVERY DAY. (Patient taking differently: 1.2 mg daily.)   Multiple Vitamin (MULTIVITAMIN WITH MINERALS) TABS tablet Take 1 tablet by mouth daily.   Patiromer Sorbitex Calcium (VELTASSA) 25.2 g PACK Take 1 packet by mouth daily at 6 (six) AM. (Patient not  taking: Reported on 03/04/2021)   sodium bicarbonate 650 MG tablet Take 1 tablet (650 mg total) by mouth 3 (three) times daily. (Patient not taking: Reported on 03/04/2021)   SURE COMFORT PEN NEEDLES 32G X 4 MM MISC Use once daily to inject victoza   traZODone (DESYREL) 50 MG tablet TAKE 1/2 TO 1 TABLET BY MOUTH NIGHTLY AT BEDTIME (Patient taking differently: Take 50 mg by mouth at  bedtime. TAKE 1/2 TO 1 TABLET BY MOUTH NIGHTLY AT BEDTIME)   [DISCONTINUED] Lancet Device MISC 1 each by Does not apply route 3 (three) times daily.   No facility-administered encounter medications on file as of 03/15/2021.    Patient Active Problem List   Diagnosis Date Noted   Hyperkalemia 03/03/2021   Anemia of chronic disease 10/23/2019   CKD (chronic kidney disease) stage 3, GFR 30-59 ml/min (HCC)    History of stroke 10/06/2017   Depression 08/28/2013   Severe obesity (BMI >= 40) ([place]) 03/12/2013   Primary localized osteoarthritis of knees, bilateral 11/08/2011   Healthcare maintenance 08/15/2010   Hyperlipidemia associated with type 2 diabetes mellitus ([place]) 12/26/2005   Hypertension associated with diabetes ([place]) 12/26/2005   Type 2 diabetes mellitus with diabetic retinopathy ([place]) 02/12/1993    Conditions to be addressed/monitored: HTN, DMII, and CKD Stage 3  Care Plan : RN Care Manager Plan of Care  Updates made by Johnney Killian, RN since 03/15/2021 12:00 AM     Problem: Chronic Disease Management and Care Coordination Needs (HTN, DM2 with diabetic retinopathy, CKD3 and Hyperkalemia)   Priority: High  Onset Date: 02/17/2021  Note:   Current Barriers: Successful initial outreach to patient this morning.  Patient states she has not had any issues with her constipation since her 03/13/21 emergency room visit. Encouraged patient to contact clinic with any issues such as constipation and she can be seen at the clinic or can have a phone visit, if appropriate.  Dr. Darrick Meigs spoke with the patients daughter about her coming in for an office visit for follow up on 03/13/21 and [facility] front desk staff is going to schedule.  Patient was distressed as she noted she has someone calling her this morning related to her insurance and she needed to get her information together.  Patient agreed to a follow up call in a few weeks. Knowledge Deficits related to plan of care for management of HTN,  DMII, CKD Stage 3, and Hyperkalemia  Chronic Disease Management support and education needs related to HTN, DMII, CKD Stage 3, and Hyperkalemia  Financial Constraints   RNCM Clinical Goal(s):  Patient will verbalize basic understanding of  HTN, DMII, CKD Stage 3, and Hyperkalemia disease process and self health management plan as evidenced by discussions with provider and RNCM. demonstrate understanding of rationale for each prescribed medication as evidenced by provider and RNCM discussing each medication and appropriate refills demonstrate Improved adherence to prescribed treatment plan for HTN, DMII, CKD Stage 3, and Hyperkalemia as evidenced by decreased potassium level. continue to work with RN Care Manager to address care management and care coordination needs related to  HTN, DMII, CKD Stage 3, and Hyperkalemia as evidenced by adherence to CM Team Scheduled appointments work with Education officer, museum to address  related to the management of Financial constraints, Level of care concerns, and Inability to perform ADL's independently related to the management of as evidenced by review of EMR and patient or social worker report not experience hospital admission as evidenced by review of EMR. Hospital Admissions  in last 6 months =  0- through collaboration with Consulting civil engineer, provider, and care team.   Interventions: 1:1 collaboration with primary care provider regarding development and update of comprehensive plan of care as evidenced by provider attestation and co-signature Inter-disciplinary care team collaboration (see longitudinal plan of care) Evaluation of current treatment plan related to  self management and patient's adherence to plan as established by provider    Chronic Kidney Disease Interventions:  (Status:  Goal on track:  NO.) Long Term Goal Evaluation of current treatment plan related to chronic kidney disease self management and patient's adherence to plan as established by provider       Reviewed medications with patient and discussed importance of compliance    Advised patient, providing education and rationale, to monitor blood pressure daily and record, calling PCP for findings outside established parameters    Reviewed scheduled/upcoming provider appointments including    Discussed plans with patient for ongoing care management follow up and provided patient with direct contact information for care management team    Last practice recorded BP readings:  BP Readings from Last 3 Encounters:  03/13/21 (!) 150/51  03/05/21 130/66  02/14/21 (!) 151/53  Most recent eGFR/CrCl:  Lab Results  Component Value Date   EGFR 34 (L) 03/02/2021    No components found for: CRCL    Diabetes Interventions:  (Status:  Goal on track:  Yes.) Long Term Goal Assessed patient's understanding of A1c goal: <7% Reviewed medications with patient and discussed importance of medication adherence Discussed plans with patient for ongoing care management follow up and provided patient with direct contact information for care management team Review of patient status, including review of consultants reports, relevant laboratory and other test results, and medications completed Assessed social determinant of health barriers Lab Results  Component Value Date   HGBA1C 6.6 (H) 03/05/2021   Hypertension Interventions:  (Status:  Goal on track:  NO.) Long Term Goal Last practice recorded BP readings:  BP Readings from Last 3 Encounters:  03/13/21 (!) 150/51  03/05/21 130/66  02/14/21 (!) 151/53  Most recent eGFR/CrCl:  Lab Results  Component Value Date   EGFR 34 (L) 03/02/2021    No components found for: CRCL  Evaluation of current treatment plan related to hypertension self management and patient's adherence to plan as established by provider Reviewed medications with patient and discussed importance of compliance Discussed plans with patient for ongoing care management follow up and  provided patient with direct contact information for care management team  Patient Goals/Self-Care Activities: Take all medications as prescribed Attend all scheduled provider appointments Call pharmacy for medication refills 3-7 days in advance of running out of medications Call provider office for new concerns or questions  Work with the social worker to address care coordination needs and will continue to work with the clinical team to address health care and disease management related needs check blood pressure daily write blood pressure results in a log or diary keep a blood pressure log take blood pressure log to all doctor appointments keep all doctor appointments take medications for blood pressure exactly as prescribed report new symptoms to your doctor  Follow Up Plan:  The patient has been provided with contact information for the care management team and has been advised to call with any health related questions or concerns.       Plan: The care management team will reach out to the patient again over the next 30 days.  Johnney Killian,  RN, BSN, [facility] Phone: [phone]: [phone]

## 2021-03-15 NOTE — Patient Instructions (Signed)
Visit Information  Thank you for taking time to visit with me today. Please don't hesitate to contact me if I can be of assistance to you before our next scheduled telephone appointment.  Our next appointment is by telephone on 04/12/21 at 10 am.  Please call the care guide team at [phone] if you need to cancel or reschedule your appointment.   If you are experiencing a Mental Health or [place] or need someone to talk to, please call the [place] National Suicide Prevention Lifeline: 612-587-7395 or TTY: (306)192-1766 TTY (934)149-5183) to talk to a trained counselor   The patient verbalized understanding of instructions, educational materials, and care plan provided today and declined offer to receive copy of patient instructions, educational materials, and care plan.   The patient has been provided with contact information for the care management team and has been advised to call with any health related questions or concerns.   Johnney Killian, RN, BSN, CCM Care Management Coordinator [facility] Phone: [phone]: [phone]

## 2021-03-16 ENCOUNTER — Telehealth: Payer: Self-pay | Admitting: Internal Medicine

## 2021-03-16 NOTE — Telephone Encounter (Signed)
-----   Message from Mitzi Hansen, MD sent at 03/15/2021  5:36 PM EST ----- Could you please call pt's daughter, [name], to arrange follow up in our office for this week?

## 2021-03-16 NOTE — Telephone Encounter (Signed)
Patient contacted at the request of Dr. Darrick Meigs.  No answer left detailed message asking to call the clinic back to schedule an appointment at their next availability.  Forwarding message back to PCP.

## 2021-03-16 NOTE — Telephone Encounter (Signed)
Deb, CM, relayed to me that Chelsea [name] wished to chat with me. I called her back this evening.  She said that she is doing well and was finally able to have a bowel movement today. She notes that she has an aide that is coming out to the house to help her with her medications.   She wanted to talk to me to ask if the Valtessa causes constipation. According to UpToDate, constipation has an incidence of 7% and diarrhea 5% with veltassa. These are listed as possible adverse effects of kayexalate as well, however not noted for lokelma.   She is agreeable to following up in the office for hospital follow up with one of my colleagues. The front desk was unable to contact her daughter, [name], today however will ask them to try again tomorrow.   Please address the following at time of follow up -follow up blood pressures after several changes made upon hospital discharge -check BMP -consider transitioning to lokelma to see if there is any improvement in her constipation.  -Revisit the discussion of nephrology referral for advanced CKD management.   Mitzi Hansen, MD 03/16/21 7:32 PM

## 2021-03-17 ENCOUNTER — Telehealth: Payer: Self-pay | Admitting: *Deleted

## 2021-03-17 NOTE — Telephone Encounter (Signed)
Call from [facility], nurse with [facility] requesting verbal order for " [facility] nursing once a week x 4 week for Plan of Care Approval". VO given - if not appropriate, let me know.

## 2021-03-17 NOTE — Telephone Encounter (Signed)
Agree 

## 2021-03-21 ENCOUNTER — Ambulatory Visit (INDEPENDENT_AMBULATORY_CARE_PROVIDER_SITE_OTHER): Payer: Medicare Other | Admitting: Internal Medicine

## 2021-03-21 VITALS — BP 145/61 | HR 74 | Temp 98.2°F | Ht 64.0 in | Wt 191.4 lb

## 2021-03-21 DIAGNOSIS — E1159 Type 2 diabetes mellitus with other circulatory complications: Secondary | ICD-10-CM

## 2021-03-21 DIAGNOSIS — F32A Depression, unspecified: Secondary | ICD-10-CM | POA: Diagnosis not present

## 2021-03-21 DIAGNOSIS — K5900 Constipation, unspecified: Secondary | ICD-10-CM

## 2021-03-21 DIAGNOSIS — I152 Hypertension secondary to endocrine disorders: Secondary | ICD-10-CM

## 2021-03-21 DIAGNOSIS — E875 Hyperkalemia: Secondary | ICD-10-CM

## 2021-03-21 DIAGNOSIS — Z0000 Encounter for general adult medical examination without abnormal findings: Secondary | ICD-10-CM

## 2021-03-21 DIAGNOSIS — D649 Anemia, unspecified: Secondary | ICD-10-CM | POA: Diagnosis not present

## 2021-03-21 LAB — BASIC METABOLIC PANEL
Anion gap: 11 (ref 5–15)
BUN: 42 mg/dL — ABNORMAL HIGH (ref 8–23)
CO2: 25 mmol/L (ref 22–32)
Calcium: 8.9 mg/dL (ref 8.9–10.3)
Chloride: 106 mmol/L (ref 98–111)
Creatinine, Ser: 1.39 mg/dL — ABNORMAL HIGH (ref 0.44–1.00)
GFR, Estimated: 40 mL/min — ABNORMAL LOW (ref >60)
Glucose, Bld: 121 mg/dL — ABNORMAL HIGH (ref 70–99)
Potassium: 4.1 mmol/L (ref 3.5–5.1)
Sodium: 142 mmol/L (ref 135–145)

## 2021-03-21 LAB — CBC
HCT: 29.9 % — ABNORMAL LOW (ref 36.0–46.0)
Hemoglobin: 9.1 g/dL — ABNORMAL LOW (ref 12.0–15.0)
MCH: 29.4 pg (ref 26.0–34.0)
MCHC: 30.4 g/dL (ref 30.0–36.0)
MCV: 96.5 fL (ref 80.0–100.0)
Platelets: 234 10*3/uL (ref 150–400)
RBC: 3.1 MIL/uL — ABNORMAL LOW (ref 3.87–5.11)
RDW: 13.6 % (ref 11.5–15.5)
WBC: 8.2 10*3/uL (ref 4.0–10.5)
nRBC: 0 % (ref 0.0–0.2)

## 2021-03-21 NOTE — Assessment & Plan Note (Signed)
Previously hospitalized with constipation that has now resolved with miralax. She had a normal soft bowel movement this morning.  - continue miralax
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
## 2021-03-21 NOTE — Patient Instructions (Signed)
Lizett Chowning  It was a pleasure seeing you in the clinic today.   We talked about life stress, your kidneys, your blood pressure, and your potassium. We are going to check blood work today.  Blood pressure: Please keep a log of what your blood pressure is at home when the home health aid comes out to the house. Bring this to your follow up visit so we can make changes to your medicines as needed.  Please call our clinic at [phone] if you have any questions or concerns. The best time to call is Monday-Friday from 9am-4pm, but there is someone available 24/7 at the same number. If you need medication refills, please notify your pharmacy one week in advance and they will send [place] a request.   Thank you for letting us take part in your care. We look forward to seeing you next time!

## 2021-03-21 NOTE — Assessment & Plan Note (Signed)
-   mammography referral placed today

## 2021-03-21 NOTE — Assessment & Plan Note (Signed)
Patient tearful on exam today. Says that she has been talking with Dr. Theodis Shove which helps some. Denies any SI. Taking trazadone to help with sleep but is not taking celexa. Does not want to restart celexa right now because she doesn't like taking so many medications. - CTM - continue to follow with Dr. Theodis Shove - continue trazadone - consider restarting celexa as needed

## 2021-03-21 NOTE — Assessment & Plan Note (Signed)
SBP 145/61 after recent medication changes due to hyperkalemia. Patient is tearful on exam today and thinks that her stress level is up. She is in agreement to record what her blood pressure is at home when the home health aid comes out to the house and will bring those values into her follow up appointment. - continue HCTZ, amlodipine, coreg - f/u blood pressure log and adjust meds as needed at follow up appointment

## 2021-03-21 NOTE — Assessment & Plan Note (Addendum)
Last potassium level 2 weeks ago was WNL. Previously on losartan/spiro which were discontinued and she was started on  Valtessa   - Recheck BMP today  Addendum: - K WNL, continue current management

## 2021-03-22 ENCOUNTER — Ambulatory Visit: Payer: Medicare Other | Admitting: Podiatry

## 2021-03-23 NOTE — Progress Notes (Signed)
Internal Medicine Clinic Attending  Case discussed with Dr. Ileene Musa  at the time of the visit.  We reviewed the residents history and exam and pertinent patient test results.  I agree with the assessment, diagnosis, and plan of care documented in the residents note.

## 2021-03-29 ENCOUNTER — Encounter: Payer: Medicare Other | Attending: Physical Medicine & Rehabilitation | Admitting: Physical Medicine & Rehabilitation

## 2021-04-03 ENCOUNTER — Other Ambulatory Visit: Payer: Self-pay | Admitting: Student

## 2021-04-04 ENCOUNTER — Ambulatory Visit: Payer: Medicare Other | Admitting: Behavioral Health

## 2021-04-04 DIAGNOSIS — F331 Major depressive disorder, recurrent, moderate: Secondary | ICD-10-CM

## 2021-04-04 DIAGNOSIS — Z639 Problem related to primary support group, unspecified: Secondary | ICD-10-CM

## 2021-04-04 DIAGNOSIS — F419 Anxiety disorder, unspecified: Secondary | ICD-10-CM

## 2021-04-04 NOTE — BH Specialist Note (Signed)
Integrated Behavioral Health via Telemedicine Visit  04/04/2021 RIA REDCAY [id number]  Number of [place] Clinician visits: 2/6 Session Start time: 1000 Session End time: 1030 Total time in minutes: 30 min  Referring Provider: Dr. Verlin Dike, MD Patient/Family location: Pt is home in private [facility] Provider location: [facility] Office All persons participating in visit: Pt & Clinician Types of Service: Individual psychotherapy  I connected with [name] and/or [place]  self  via  Telephone or Video Enabled Telemedicine Application  (Video is Caregility application) and verified that I am speaking with the correct person using two identifiers. Discussed confidentiality: Yes   I discussed the limitations of telemedicine and the availability of in person appointments.  Discussed there is a possibility of technology failure and discussed alternative modes of communication if that failure occurs.  I discussed that engaging in this telemedicine visit, they consent to the provision of behavioral healthcare and the services will be billed under their insurance.  Patient and/or legal guardian expressed understanding and consented to Telemedicine visit: Yes   Presenting Concerns: Patient and/or family reports the following symptoms/concerns: some anx/dep due to lack of ambulation Duration of problem: years Severity of problem: moderate  Patient and/or Family's Strengths/Protective Factors: Social and Emotional competence, Concrete supports in place (healthy food, safe environments, etc.), Sense of purpose, and Physical Health (exercise, healthy diet, medication compliance, etc.)  Goals Addressed: Patient will:  Reduce symptoms of: anxiety and depression   Increase knowledge and/or ability of: coping skills   Demonstrate ability to: Increase healthy adjustment to current life circumstances  Progress towards Goals: Ongoing  Interventions: Interventions  utilized:  Supportive Counseling Standardized Assessments completed:  screeners prn  Patient and/or Family Response: Pt is receptive to call today.  Assessment: Patient currently experiencing reduction in anx/dep.   Patient may benefit from cont'd ck-ins for mental health wellbeing.  Plan: Follow up with behavioral health clinician on : late June for final ck-in with this Clinician Behavioral recommendations: None today; keep your Adult Children in line & reach out to them as needed! Referral(s): [place] (In Clinic)  I discussed the assessment and treatment plan with the patient and/or parent/guardian. They were provided an opportunity to ask questions and all were answered. They agreed with the plan and demonstrated an understanding of the instructions.   They were advised to call back or seek an in-person evaluation if the symptoms worsen or if the condition fails to improve as anticipated.  Donnetta Hutching, LMFT

## 2021-04-06 ENCOUNTER — Other Ambulatory Visit: Payer: Self-pay

## 2021-04-06 MED ORDER — HYDROCHLOROTHIAZIDE 25 MG PO TABS: 25.0000 mg | ORAL_TABLET | Freq: Every day | ORAL | 0 refills | Status: DC

## 2021-04-06 MED ORDER — CARVEDILOL 3.125 MG PO TABS: 3.1250 mg | ORAL_TABLET | Freq: Two times a day (BID) | ORAL | 0 refills | Status: DC

## 2021-04-11 ENCOUNTER — Other Ambulatory Visit: Payer: Self-pay | Admitting: Internal Medicine

## 2021-04-12 ENCOUNTER — Telehealth: Payer: Self-pay

## 2021-04-12 ENCOUNTER — Ambulatory Visit: Payer: Medicare Other

## 2021-04-12 NOTE — Telephone Encounter (Signed)
Aliya from [facility] called she is requesting verbal order for physical therapy with a frequency of 1x a week for 5 weeks call back:([phone] fax:[phone] ?

## 2021-04-12 NOTE — Telephone Encounter (Signed)
RTC from [name] patient's daughter about dosing of Sodium Bicarbonate .  Patient is taking Sodium Bicarbonate 650 mg 3 times a day, ?

## 2021-04-12 NOTE — Telephone Encounter (Signed)
Call from patient about refill for the Sodium Bicarbonate.  Patient was informed that a message has been sent to her PCP.  Awaiting decision about the refill.  Patient states has been out a couple of days now. ?

## 2021-04-12 NOTE — Telephone Encounter (Signed)
Call to Patient's daughter [name].  Not at home to check dosing of Sodium Bicarbonate.   Will check and call back with dosage. ?

## 2021-04-12 NOTE — Chronic Care Management (AMB) (Signed)
Care Management    RN Visit Note  04/12/2021 Name: Chelsea [name] MRN: [id number] DOB: Aug 12, 1950  Subjective: Chelsea [name] is a 71 y.o. year old female who is a primary care patient of Christian, Lucinda Dell, MD. The care management team was consulted for assistance with disease management and care coordination needs.    Engaged with patient by telephone for follow up visit in response to provider referral for case management and/or care coordination services.   Consent to Services:   Ms. Zamorano was given information about Care Management services today including:  Care Management services includes personalized support from designated clinical staff supervised by her physician, including individualized plan of care and coordination with other care providers 24/7 contact phone numbers for assistance for urgent and routine care needs. The patient may stop case management services at any time by phone call to the office staff.  Patient agreed to services and consent obtained.   Assessment: Review of patient past medical history, allergies, medications, health status, including review of consultants reports, laboratory and other test data, was performed as part of comprehensive evaluation and provision of chronic care management services.   SDOH (Social Determinants of Health) assessments and interventions performed:    Care Plan  Allergies  Allergen Reactions   Ace Inhibitors Other (See Comments)    Severe hyperkalemia Jan 2023 from ARB, assumed ACEi would have same effect   Aldactone [Spironolactone] Other (See Comments)    Severe hyperkalemia Jan 2023   Angiotensin Receptor Blockers Other (See Comments)    Severe hyperkalemia Jan 2023   Atarax [Hydroxyzine]     Hallucination    Lisinopril Cough   Nsaids Nausea And Vomiting   Penicillins Other (See Comments)    shaking    Outpatient Encounter Medications as of 04/12/2021  Medication Sig   Accu-Chek Softclix Lancets lancets Use  to check blood sugar 2 times per day.   albuterol (VENTOLIN HFA) 108 (90 Base) MCG/ACT inhaler INHALE 2 PUFFS into lungs EVERY 6 HOURS AS NEEDED FOR WHEEZING OR SHORTNESS OF BREATH (Patient not taking: Reported on 03/04/2021)   amLODipine (NORVASC) 10 MG tablet Take 1 tablet (10 mg total) by mouth daily.   Aspirin 81 MG CAPS Take 81 mg by mouth daily.   atorvastatin (LIPITOR) 80 MG tablet Take 1 tablet (80 mg total) by mouth daily.   Blood Glucose Monitoring Suppl (ACCU-CHEK GUIDE ME) w/Device KIT Use to check blood sugar one time per day.   carvedilol (COREG) 3.125 MG tablet Take 1 tablet (3.125 mg total) by mouth 2 (two) times daily.   citalopram (CELEXA) 20 MG tablet TAKE 1 TABLET BY MOUTH NIGHTLY AT BEDTIME (Patient not taking: Reported on 03/04/2021)   fluticasone (FLONASE) 50 MCG/ACT nasal spray Place 2 sprays into both nostrils daily. (Patient taking differently: Place 1 spray into both nostrils daily as needed for allergies.)   glucose blood (ACCU-CHEK GUIDE) test strip Use to check blood sugar two times per day.   hydrochlorothiazide (HYDRODIURIL) 25 MG tablet Take 1 tablet (25 mg total) by mouth daily.   Lancets Misc. (ACCU-CHEK FASTCLIX LANCET) KIT Use to check blood sugar twice a day as discussed.   liraglutide (VICTOZA) 18 MG/3ML SOPN inject 1.11m into THE SKIN EVERY DAY. (Patient taking differently: 1.2 mg daily.)   Multiple Vitamin (MULTIVITAMIN WITH MINERALS) TABS tablet Take 1 tablet by mouth daily.   Patiromer Sorbitex Calcium (VELTASSA) 25.2 g PACK Take 1 packet by mouth daily at 6 (six) AM. (Patient not  taking: Reported on 03/04/2021)   sodium bicarbonate 650 MG tablet Take 1 tablet (650 mg total) by mouth 3 (three) times daily. (Patient not taking: Reported on 03/04/2021)   SURE COMFORT PEN NEEDLES 32G X 4 MM MISC Use once daily to inject victoza   traZODone (DESYREL) 50 MG tablet TAKE 1/2 TO 1 TABLET BY MOUTH NIGHTLY AT BEDTIME (Patient taking differently: Take 50 mg by mouth at  bedtime. TAKE 1/2 TO 1 TABLET BY MOUTH NIGHTLY AT BEDTIME)   [DISCONTINUED] Lancet Device MISC 1 each by Does not apply route 3 (three) times daily.   No facility-administered encounter medications on file as of 04/12/2021.    Patient Active Problem List   Diagnosis Date Noted   Constipation 03/21/2021   Hyperkalemia 03/03/2021   Anemia of chronic disease 10/23/2019   CKD (chronic kidney disease) stage 3, GFR 30-59 ml/min (HCC)    History of stroke 10/06/2017   Depression 08/28/2013   Severe obesity (BMI >= 40) (HCC) 03/12/2013   Primary localized osteoarthritis of knees, bilateral 11/08/2011   Healthcare maintenance 08/15/2010   Hyperlipidemia associated with type 2 diabetes mellitus ([place]) 12/26/2005   Hypertension associated with diabetes ([place]) 12/26/2005   Type 2 diabetes mellitus with diabetic retinopathy ([place]) 02/12/1993    Conditions to be addressed/monitored: HTN, DMII, and CKD Stage 3  Care Plan : RN Care Manager Plan of Care  Updates made by Johnney Killian, RN since 04/12/2021 12:00 AM     Problem: Chronic Disease Management and Care Coordination Needs (HTN, DM2 with diabetic retinopathy, CKD3 and Hyperkalemia)   Priority: High  Onset Date: 02/17/2021  Note:   Current Barriers: Successful initial outreach to patient this morning.  Patient states she has occasional stomach issues but she is not having constipation episodes.  She does take her Miralax as needed.  Patient noted her recent blood pressure reading was 126/62 and she has been taking her medication as ordered.  Patient shared she is eating well and does not have any food insecurity at this time.  Her CBG reading that she takes at night run between 130-140.  Patient agreed to follow up appointment in 4-6 weeks. Knowledge Deficits related to plan of care for management of HTN, DMII, CKD Stage 3, and Hyperkalemia  Chronic Disease Management support and education needs related to HTN, DMII, CKD Stage 3, and Hyperkalemia   Financial Constraints   RNCM Clinical Goal(s):  Patient will verbalize basic understanding of  HTN, DMII, CKD Stage 3, and Hyperkalemia disease process and self health management plan as evidenced by discussions with provider and RNCM. demonstrate understanding of rationale for each prescribed medication as evidenced by provider and RNCM discussing each medication and appropriate refills demonstrate Improved adherence to prescribed treatment plan for HTN, DMII, CKD Stage 3, and Hyperkalemia as evidenced by decreased potassium level. continue to work with RN Care Manager to address care management and care coordination needs related to  HTN, DMII, CKD Stage 3, and Hyperkalemia as evidenced by adherence to CM Team Scheduled appointments work with Education officer, museum to address  related to the management of Financial constraints, Level of care concerns, and Inability to perform ADL's independently related to the management of as evidenced by review of EMR and patient or social worker report not experience hospital admission as evidenced by review of EMR. Hospital Admissions in last 6 months =  0- through collaboration with RN Care manager, provider, and care team.  Interventions: 1:1 collaboration with primary care provider regarding development and  update of comprehensive plan of care as evidenced by provider attestation and co-signature Inter-disciplinary care team collaboration (see longitudinal plan of care) Evaluation of current treatment plan related to  self management and patient's adherence to plan as established by provider   Chronic Kidney Disease Interventions:  (Status:  Goal on track:  NO.) Long Term Goal Evaluation of current treatment plan related to chronic kidney disease self management and patient's adherence to plan as established by provider      Reviewed medications with patient and discussed importance of compliance    Advised patient, providing education and rationale, to monitor  blood pressure daily and record, calling PCP for findings outside established parameters    Reviewed scheduled/upcoming provider appointments including    Discussed plans with patient for ongoing care management follow up and provided patient with direct contact information for care management team    Last practice recorded BP readings:  BP Readings from Last 3 Encounters:  03/21/21 (!) 145/61  03/13/21 (!) 150/51  03/05/21 130/66  Most recent eGFR/CrCl:  Lab Results  Component Value Date   EGFR 34 (L) 03/02/2021    No components found for: CRCL  Diabetes Interventions:  (Status:  Goal on track:  Yes.) Long Term Goal Assessed patient's understanding of A1c goal: <7% Reviewed medications with patient and discussed importance of medication adherence Discussed plans with patient for ongoing care management follow up and provided patient with direct contact information for care management team Review of patient status, including review of consultants reports, relevant laboratory and other test results, and medications completed Assessed social determinant of health barriers Lab Results  Component Value Date   HGBA1C 6.6 (H) 03/05/2021   Hypertension Interventions:  (Status:  Goal on track:  NO.) Long Term Goal Last practice recorded BP readings:  BP Readings from Last 3 Encounters:  03/21/21 (!) 145/61  03/13/21 (!) 150/51  03/05/21 130/66  Most recent eGFR/CrCl:  Lab Results  Component Value Date   EGFR 34 (L) 03/02/2021    No components found for: CRCL  Evaluation of current treatment plan related to hypertension self management and patient's adherence to plan as established by provider Reviewed medications with patient and discussed importance of compliance Discussed plans with patient for ongoing care management follow up and provided patient with direct contact information for care management team  Patient Goals/Self-Care Activities: Take all medications as  prescribed Attend all scheduled provider appointments Call pharmacy for medication refills 3-7 days in advance of running out of medications Call provider office for new concerns or questions  Work with the social worker to address care coordination needs and will continue to work with the clinical team to address health care and disease management related needs check blood pressure daily write blood pressure results in a log or diary keep a blood pressure log take blood pressure log to all doctor appointments keep all doctor appointments take medications for blood pressure exactly as prescribed report new symptoms to your doctor  Follow Up Plan:  The patient has been provided with contact information for the care management team and has been advised to call with any health related questions or concerns.       Plan: Telephone follow up appointment with care management team member scheduled for:  4-6 weeks  Johnney Killian, RN, BSN, CCM Care Management Coordinator [facility] Phone: [phone]: [phone]

## 2021-04-12 NOTE — Patient Instructions (Signed)
Visit Information ? ?Thank you for taking time to visit with me today. Please don't hesitate to contact me if I can be of assistance to you before our next scheduled telephone appointment. ? ?Our next appointment is by telephone on 05/24/21 at 10:30. ? ?Please call the care guide team at [phone] if you need to cancel or reschedule your appointment.  ? ?If you are experiencing a Mental Health or Independence or need someone to talk to, please call the [place] National Suicide Prevention Lifeline: (980) 522-1558 or TTY: [phone] TTY 727 553 2769) to talk to a trained counselor  ? ?The patient verbalized understanding of instructions, educational materials, and care plan provided today and declined offer to receive copy of patient instructions, educational materials, and care plan.  ? ?The patient has been provided with contact information for the care management team and has been advised to call with any health related questions or concerns.  ? ?Johnney Killian, RN, BSN, CCM ?Care Management Coordinator ?[facility] ?Phone: [phone]/QJF: [phone]  ? ?

## 2021-04-12 NOTE — Telephone Encounter (Signed)
RTC to [place] from [facility].  Would like to do PT for 1 day per week for 5 weeks.  Concentration on strength,  balance , walking in the house and outside on gravel walkway.  Is currently at a risk of falling.  Verbal ok given and message to PCP for confirmation.   ?

## 2021-04-13 NOTE — Telephone Encounter (Signed)
Per Regino Schultze' note below: ? ?Patient is taking Sodium Bicarbonate 650 mg 3 times a day, ?

## 2021-04-13 NOTE — Telephone Encounter (Signed)
Patient calling again for refill on sodium bicarb. ?

## 2021-05-01 ENCOUNTER — Ambulatory Visit (INDEPENDENT_AMBULATORY_CARE_PROVIDER_SITE_OTHER): Payer: Medicare Other | Admitting: Internal Medicine

## 2021-05-01 ENCOUNTER — Encounter: Payer: Self-pay | Admitting: Internal Medicine

## 2021-05-01 ENCOUNTER — Other Ambulatory Visit: Payer: Self-pay

## 2021-05-01 VITALS — BP 149/56 | HR 65 | Temp 98.2°F | Resp 28 | Ht 64.0 in | Wt 189.7 lb

## 2021-05-01 DIAGNOSIS — E1122 Type 2 diabetes mellitus with diabetic chronic kidney disease: Secondary | ICD-10-CM | POA: Diagnosis not present

## 2021-05-01 DIAGNOSIS — F325 Major depressive disorder, single episode, in full remission: Secondary | ICD-10-CM

## 2021-05-01 DIAGNOSIS — E1159 Type 2 diabetes mellitus with other circulatory complications: Secondary | ICD-10-CM | POA: Diagnosis not present

## 2021-05-01 DIAGNOSIS — N1832 Chronic kidney disease, stage 3b: Secondary | ICD-10-CM

## 2021-05-01 DIAGNOSIS — E875 Hyperkalemia: Secondary | ICD-10-CM

## 2021-05-01 DIAGNOSIS — Z1211 Encounter for screening for malignant neoplasm of colon: Secondary | ICD-10-CM

## 2021-05-01 DIAGNOSIS — I152 Hypertension secondary to endocrine disorders: Secondary | ICD-10-CM

## 2021-05-01 MED ORDER — HYDROCHLOROTHIAZIDE 25 MG PO TABS: 25.0000 mg | ORAL_TABLET | Freq: Every day | ORAL | 1 refills | Status: DC

## 2021-05-01 MED ORDER — CARVEDILOL 3.125 MG PO TABS: 3.1250 mg | ORAL_TABLET | Freq: Two times a day (BID) | ORAL | 1 refills | Status: DC

## 2021-05-01 NOTE — Assessment & Plan Note (Signed)
[name] states that she is taking sodium bicarb 3 times daily as instructed. ? ?Assessment/plan: ?- Repeat BMP today ?- Continue sodium bicarb 650 mg 3 times daily ? ?
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
## 2021-05-01 NOTE — Assessment & Plan Note (Addendum)
Ms. Dibuono states she is taking Patiromer packets every few days.  Her last dose was yesterday morning.  She is unsure if she is still supposed to be taking this daily or not.  Since taking this medicine, she has been experiencing constipation. ? ?Assessment/plan: ?Hyperkalemia in the setting of ARB and spironolactone use.  Since these medications have been discontinued, potassium has normalized.  We will recheck today and if within normal limits, discontinue Patiromet packets.  ? ?-BMP pending ? ?ADDENDUM:  ?Potassium at the upper limit of normal (5.2). Due to this, will recommend continuation of Patiromer every other day and follow up in 2 weeks.  ? ?Unable to reach patient's daughter [name]. VM left.  ?

## 2021-05-01 NOTE — Assessment & Plan Note (Signed)
BP: (!) 149/56 ? ?Ms. Lufkin states that her home health nurse helps her make sure she is taking her medicine regularly.  She is currently taking amlodipine, Coreg, and hydrochlorothiazide.  She brought her home blood pressure log with her.  She denies any difficulty with her medications at this time. ? ?Of note, she states that occasionally she experiences dizziness when standing up fast.  It has never led to falls and she finds the symptoms not bothersome at all. ? ?Assessment/plan: ?Home blood pressure log demonstrates SBP's consistently in the low 120s.  His blood pressures are obtained by an RN in patient's home.  Due to this, we will hold off on making any changes to her medicine at this time although her blood pressure today in the clinic is elevated. ? ?- Continue amlodipine 10 mg daily, Coreg 3.125 mg twice daily, and hydrochlorothiazide 25 mg daily ?- Follow-up in 3 months with BP log ?

## 2021-05-01 NOTE — Assessment & Plan Note (Signed)
Chelsea [name] states that her depression seems significantly improved.  When she does feel down, she likes to call family or friends and finds that this cheers her up.  Overall she is feeling happy and does not feel the need to restart Celexa. ? ?Assessment/plan: ?PHQ-9 today is elevated, however given our discussion, no indication to start SSRI today.  She still uses trazodone to sleep and will continue to do so at this time. ? ?- PHQ-9 at every visit ?- Hold off on restarting Celexa ?- Continue trazodone 25 mg at bedtime ?

## 2021-05-01 NOTE — Patient Instructions (Addendum)
It was nice seeing you today! Thank you for choosing [facility] for your Primary Care.  ?  ?Today we talked about:  ? ?High blood pressure: Your blood pressure today is a bit high, but it is always great at home, so we are not making any changes today.  ?Continue Amlodipine, Carvedilol and Hydrochlorothiazide  ? ?Diabetes: You are not due for an A1c today ?Continue Victoza once daily ? ?Potassium levels: We are checking your blood work today. I will call you with the results.  ? ?Mammogram: It looks like mammogram was ordered in February. Regino Schultze will help with scheduling this today.  ?

## 2021-05-02 ENCOUNTER — Ambulatory Visit: Payer: Medicare Other | Admitting: Behavioral Health

## 2021-05-02 ENCOUNTER — Telehealth: Payer: Self-pay | Admitting: Behavioral Health

## 2021-05-02 LAB — BMP8+ANION GAP
Anion Gap: 16 mmol/L (ref 10.0–18.0)
BUN/Creatinine Ratio: 23 (ref 12–28)
BUN: 35 mg/dL — ABNORMAL HIGH (ref 8–27)
CO2: 20 mmol/L (ref 20–29)
Calcium: 9 mg/dL (ref 8.7–10.3)
Chloride: 104 mmol/L (ref 96–106)
Creatinine, Ser: 1.49 mg/dL — ABNORMAL HIGH (ref 0.57–1.00)
Glucose: 122 mg/dL — ABNORMAL HIGH (ref 70–99)
Potassium: 5.2 mmol/L (ref 3.5–5.2)
Sodium: 140 mmol/L (ref 134–144)
eGFR: 37 mL/min/{1.73_m2} — ABNORMAL LOW (ref >59)

## 2021-05-02 NOTE — Telephone Encounter (Signed)
Unsuccessful attempt to reach Pt via her mobile line. Lft msg for Pt re: appt today. ? ?Contacted Pt on home line several times & Pt either did not ans or disconnected the phone even w/Clinician's explanation for call. ? ?Dr. Theodis Shove ?

## 2021-05-04 ENCOUNTER — Telehealth: Payer: Self-pay | Admitting: Internal Medicine

## 2021-05-04 MED ORDER — VELTASSA 25.2 G PO PACK: 1.0000 | PACK | ORAL | 0 refills | Status: DC

## 2021-05-04 NOTE — Addendum Note (Signed)
Addended by: Jose Persia on: 05/04/2021 03:52 PM ? ? Modules accepted: Orders ? ?

## 2021-05-04 NOTE — Telephone Encounter (Signed)
Attempted to call regarding blood work results. Potassium on the upper limit of normal at 5.2. Due to this, recommend continuation of Patiromer packets every other day with follow up visit in 2 weeks.  ? ?VM left.  ?

## 2021-05-08 NOTE — Progress Notes (Signed)
Internal Medicine Clinic Attending  Case discussed with Dr. Basaraba at the time of the visit.  We reviewed the resident's history and exam and pertinent patient test results.  I agree with the assessment, diagnosis, and plan of care documented in the resident's note.    

## 2021-05-09 ENCOUNTER — Telehealth: Payer: Self-pay

## 2021-05-09 NOTE — Telephone Encounter (Signed)
PA for pt [facility] )  came through on cover my meds was submitted with office notes  and labs ... awaiting approval or denial .. ? ? ? UPDATE : ? ?[facility] is reviewing your PA request. Typically an electronic response will be received within 24-72 hours ?

## 2021-05-10 NOTE — Telephone Encounter (Signed)
DECISION : ? ? ? ? ? ?N/A today ? ? ? ?This medication or product is on your plan's list of covered drugs. Prior authorization is not required at this time. If your pharmacy has questions regarding the processing of your prescription, please have them call the [facility] pharmacy ? ? ? ? ? ? ?( COPY SENT TO PHARMACY ALSO )  ?

## 2021-05-24 ENCOUNTER — Telehealth: Payer: Medicare Other

## 2021-05-25 ENCOUNTER — Encounter: Payer: Medicare Other | Admitting: Internal Medicine

## 2021-05-26 ENCOUNTER — Encounter: Payer: Self-pay | Admitting: Internal Medicine

## 2021-05-29 ENCOUNTER — Telehealth: Payer: Self-pay

## 2021-05-29 ENCOUNTER — Telehealth: Payer: Medicare Other

## 2021-05-29 NOTE — Telephone Encounter (Signed)
?  Care Management  ? ?Outreach Note ? ?05/29/2021 ?Name: Chelsea [name] MRN: [id number] DOB: 1950/08/08 ? ?Referred by: Mitzi Hansen, MD ?Reason for referral : No chief complaint on file. ? ? ?An unsuccessful telephone outreach was attempted today. The patient was referred to the case management team for assistance with care management and care coordination.  ? ?Follow Up Plan: If patient returns call to provider office, please advise to call [place] at [phone]. ? ?Johnney Killian, RN, BSN, CCM ?Care Management Coordinator ?[facility] ?Phone: [phone]/PYY: [phone]  ?

## 2021-06-05 ENCOUNTER — Ambulatory Visit: Payer: Self-pay

## 2021-06-05 ENCOUNTER — Telehealth: Payer: Self-pay

## 2021-06-05 ENCOUNTER — Telehealth: Payer: Medicare Other

## 2021-06-05 NOTE — Telephone Encounter (Signed)
?  Care Management  ? ?Outreach Note ? ?06/05/2021 ?Name: Chelsea [name] MRN: [id number] DOB: [birth year] ? ?Referred by: Mitzi Hansen, MD ?Reason for referral : No chief complaint on file. ? ? ?A second unsuccessful telephone outreach was attempted today. The patient was referred to the case management team for assistance with care management and care coordination.  ? ?Follow Up Plan: If patient returns call to provider office, please advise to call [place] at [phone]. ? ?Johnney Killian, RN, BSN, CCM ?Care Management Coordinator ?[facility] ?Phone: [phone]/DTO: [phone]   ?

## 2021-06-05 NOTE — Chronic Care Management (AMB) (Signed)
? ?  06/05/2021 ? ?Chelsea [name] ?March 28, 1950 ?[id number] ? ?Incoming call from patient who was very upset, weeping on the phone.  She said her brother who has dementia showed up at her house and she cannot handle him.  Patient put her daughter [name] on the phone who explained the situation and they were transporting her Uncle to the hospital, the police were also present.  Told [name] to have her mom call this RNCM back when she wanted to talk and was feeling better.  So we can discuss her CBG readings and overall health status. ?Johnney Killian, RN, BSN, CCM ?Care Management Coordinator ?[facility] ?Phone: [phone]/FMB: [phone]  ? ?

## 2021-06-16 ENCOUNTER — Telehealth: Payer: Self-pay

## 2021-06-16 ENCOUNTER — Ambulatory Visit: Payer: Medicare Other

## 2021-06-16 NOTE — Telephone Encounter (Signed)
?  Care Management  ? ?Outreach Note ? ?06/16/2021 ?Name: Chelsea [name] MRN: [id number] DOB: September 19, 1950 ? ?Referred by: Mitzi Hansen, MD ?Reason for referral : Chronic Care Management ? ? ?Third unsuccessful telephone outreach was attempted today. The patient was referred to the case management team for assistance with care management and care coordination. The patient's primary care provider has been notified of our unsuccessful attempts to make or maintain contact with the patient. The care management team is pleased to engage with this patient at any time in the future should he/she be interested in assistance from the care management team.  ? ?Follow Up Plan: The patient has been provided with contact information for the care management team and has been advised to call with any health related questions or concerns.  ? ?Johnney Killian, RN, BSN, CCM ?Care Management Coordinator ?[facility] ?Phone: [phone]/ZTI: [phone]  ?

## 2021-06-16 NOTE — Chronic Care Management (AMB) (Signed)
Care Management    RN Visit Note  06/16/2021 Name: Chelsea [name] MRN: [id number] DOB: 1950-03-03  Subjective: Chelsea [name] is a 71 y.o. year old female who is a primary care patient of Christian, Rica Records, MD. The care management team was consulted for assistance with disease management and care coordination needs.    Engaged with patient by telephone for follow up visit in response to provider referral for case management and/or care coordination services.   Consent to Services:   [name] was given information about Care Management services today including:  Care Management services includes personalized support from designated clinical staff supervised by her physician, including individualized plan of care and coordination with other care providers 24/7 contact phone numbers for assistance for urgent and routine care needs. The patient may stop case management services at any time by phone call to the office staff.  Patient agreed to services and consent obtained.   Assessment: Review of patient past medical history, allergies, medications, health status, including review of consultants reports, laboratory and other test data, was performed as part of comprehensive evaluation and provision of chronic care management services.   SDOH (Social Determinants of Health) assessments and interventions performed:    Care Plan  Allergies  Allergen Reactions   Ace Inhibitors Other (See Comments)    Severe hyperkalemia Jan 2023 from [facility], assumed ACEi would have same effect   Aldactone [Spironolactone] Other (See Comments)    Severe hyperkalemia Jan 2023   Angiotensin Receptor Blockers Other (See Comments)    Severe hyperkalemia Jan 2023   Atarax [Hydroxyzine]     Hallucination    Lisinopril Cough   Nsaids Nausea And Vomiting   Penicillins Other (See Comments)    shaking    Outpatient Encounter Medications as of 06/16/2021  Medication Sig   Accu-Chek Softclix Lancets lancets Use  to check blood sugar 2 times per day.   albuterol (VENTOLIN HFA) 108 (90 Base) MCG/ACT inhaler INHALE 2 PUFFS into lungs EVERY 6 HOURS AS NEEDED FOR WHEEZING OR SHORTNESS OF BREATH (Patient not taking: Reported on 03/04/2021)   amLODipine (NORVASC) 10 MG tablet Take 1 tablet (10 mg total) by mouth daily.   Aspirin 81 MG CAPS Take 81 mg by mouth daily.   atorvastatin (LIPITOR) 80 MG tablet Take 1 tablet (80 mg total) by mouth daily.   Blood Glucose Monitoring Suppl (ACCU-CHEK GUIDE ME) w/Device KIT Use to check blood sugar one time per day.   carvedilol (COREG) 3.125 MG tablet Take 1 tablet (3.125 mg total) by mouth 2 (two) times daily.   fluticasone (FLONASE) 50 MCG/ACT nasal spray Place 2 sprays into both nostrils daily. (Patient taking differently: Place 1 spray into both nostrils daily as needed for allergies.)   glucose blood (ACCU-CHEK GUIDE) test strip Use to check blood sugar two times per day.   hydrochlorothiazide (HYDRODIURIL) 25 MG tablet Take 1 tablet (25 mg total) by mouth daily.   Lancets Misc. (ACCU-CHEK FASTCLIX LANCET) KIT Use to check blood sugar twice a day as discussed.   liraglutide (VICTOZA) 18 MG/3ML SOPN inject 1.2mg  into THE SKIN EVERY DAY. (Patient taking differently: 1.2 mg daily.)   Multiple Vitamin (MULTIVITAMIN WITH MINERALS) TABS tablet Take 1 tablet by mouth daily.   Patiromer Sorbitex Calcium (VELTASSA) 25.2 g PACK Take 1 packet by mouth every other day.   sodium bicarbonate 650 MG tablet Take 1 tablet (650 mg total) by mouth 3 (three) times daily.   SURE COMFORT PEN NEEDLES  32G X 4 MM MISC Use once daily to inject victoza   traZODone (DESYREL) 50 MG tablet TAKE 1/2 TO 1 TABLET BY MOUTH NIGHTLY AT BEDTIME (Patient taking differently: Take 50 mg by mouth at bedtime. TAKE 1/2 TO 1 TABLET BY MOUTH NIGHTLY AT BEDTIME)   [DISCONTINUED] Lancet Device MISC 1 each by Does not apply route 3 (three) times daily.   No facility-administered encounter medications on file as of  06/16/2021.    Patient Active Problem List   Diagnosis Date Noted   Constipation 03/21/2021   Hyperkalemia 03/03/2021   Anemia of chronic disease 10/23/2019   CKD (chronic kidney disease) stage 3, GFR 30-59 ml/min (HCC)    History of stroke 10/06/2017   Depression 08/28/2013   Severe obesity (BMI >= 40) (HCC) 03/12/2013   Primary localized osteoarthritis of knees, bilateral 11/08/2011   Healthcare maintenance 08/15/2010   Hyperlipidemia associated with type 2 diabetes mellitus (HCC) 12/26/2005   Hypertension associated with diabetes (HCC) 12/26/2005   Type 2 diabetes mellitus with diabetic retinopathy (HCC) 02/12/1993    Care Plan : RN Care Manager Plan of Care  Updates made by Jodelle Gross, RN since 06/16/2021 12:00 AM     Goal: Chronic Disease Management Needs (HTN, DM2CKD3)   Start Date: 06/16/2021  This Visit's Progress: On track  Priority: High  Note:   Current Barriers: Successful outreach to patient after she returned call to [facility] and requested another call from this RNCM.  Patient shared that she is feeling better than she was the last time I spoke with her.  She says she often feels down, sometimes feels confused.  She noted that these feelings are from having a stroke and not being able to do the things she once was able.  Patient has had sessions with Dr. Monna Fam in the past and would benefit from reconnecting for psychotherapy.  Plan to notify Dr. Monna Fam she should reach out to patient.  We reviewed her medications, patient is taking Victoza for her diabetes.  She shared her CBG's have been running between 140-150 which she takes every evening.  Patient shared she has help from her son who works at [employer] and her daughter also assists her.  Patient is aware she needs to make a follow up appointment at the clinic and she is trying to coordinate with her daughter's schedule to make the appointment.  Plan; Follow up with patient in one month, collaborate with Dr.  Monna Fam. Knowledge Deficits related to plan of care for management of HTN, DMII, and CKD Stage 3  Chronic Disease Management support and education needs related to HTN, DMII, and CKD Stage 3  Financial Constraints  RNCM Clinical Goal(s):  Patient will verbalize understanding of plan for management of HTN, DMII, and CKD Stage 3 as evidenced by discussions with provider and RNCM. take all medications exactly as prescribed and will call provider for medication related questions as evidenced by medication review with RNCM. continue to work with RN Care Manager to address care management and care coordination needs related to  HTN, DMII, and CKD Stage 3 as evidenced by adherence to CM Team Scheduled appointments through collaboration with RN Care manager, provider, and care team.  Interventions: 1:1 collaboration with primary care provider regarding development and update of comprehensive plan of care as evidenced by provider attestation and co-signature Inter-disciplinary care team collaboration (see longitudinal plan of care) Evaluation of current treatment plan related to  self management and patient's adherence to plan as established by  provider  Chronic Kidney Disease Interventions:  (Status:  Goal on track:  NO.) Long Term Goal Reviewed medications with patient and discussed importance of compliance    Reviewed scheduled/upcoming provider appointments including    Discussed plans with patient for ongoing care management follow up and provided patient with direct contact information for care management team    Screening for signs and symptoms of depression related to chronic disease state      Discussed the impact of chronic kidney disease on daily life and mental health and acknowledged and normalized feelings of disempowerment, fear, and frustration    Last practice recorded BP readings:  BP Readings from Last 3 Encounters:  05/01/21 (!) 149/56  03/21/21 (!) 145/61  03/13/21 (!) 150/51   Most recent eGFR/CrCl:  Lab Results  Component Value Date   EGFR 37 (L) 05/01/2021    No components found for: CRCL Diabetes Interventions:  (Status:  Goal on track:  Yes.) Long Term Goal Assessed patient's understanding of A1c goal: <7% Reviewed medications with patient and discussed importance of medication adherence Counseled on importance of regular laboratory monitoring as prescribed Discussed plans with patient for ongoing care management follow up and provided patient with direct contact information for care management team Reviewed scheduled/upcoming provider appointments including: Need to make follow up appointment Review of patient status, including review of consultants reports, relevant laboratory and other test results, and medications completed Lab Results  Component Value Date   HGBA1C 6.6 (H) 03/05/2021  Hypertension Interventions:  (Status:  Goal on track:  NO.) Long Term Goal Last practice recorded BP readings:  BP Readings from Last 3 Encounters:  05/01/21 (!) 149/56  03/21/21 (!) 145/61  03/13/21 (!) 150/51  Most recent eGFR/CrCl:  Lab Results  Component Value Date   EGFR 37 (L) 05/01/2021    No components found for: CRCL  Evaluation of current treatment plan related to hypertension self management and patient's adherence to plan as established by provider Reviewed medications with patient and discussed importance of compliance Discussed plans with patient for ongoing care management follow up and provided patient with direct contact information for care management team Reviewed scheduled/upcoming provider appointments including:   Patient Goals/Self-Care Activities: Take all medications as prescribed Attend all scheduled provider appointments Call pharmacy for medication refills 3-7 days in advance of running out of medications Perform all self care activities independently  Call provider office for new concerns or questions   Follow Up Plan:  The  patient has been provided with contact information for the care management team and has been advised to call with any health related questions or concerns.       Plan: The patient has been provided with contact information for the care management team and has been advised to call with any health related questions or concerns.   Jodelle Gross, RN, BSN, CCM Care Management Coordinator [facility] Phone: [phone]/Fax: [phone]

## 2021-06-16 NOTE — Patient Instructions (Signed)
Visit Information ? ?Thank you for taking time to visit with me today. Please don't hesitate to contact me if I can be of assistance to you before our next scheduled telephone appointment. ? ?Our next appointment is by telephone on 07/14/21 at 0930. ? ?Please call the care guide team at [phone] if you need to cancel or reschedule your appointment.  ? ?If you are experiencing a Mental Health or [place] or need someone to talk to, please call the [place] National Suicide Prevention Lifeline: 438-817-6125 or TTY: 3086785289 TTY 231-784-2470) to talk to a trained counselor  ? ?The patient verbalized understanding of instructions, educational materials, and care plan provided today and agreed to receive a mailed copy of patient instructions, educational materials, and care plan.  ? ?The patient has been provided with contact information for the care management team and has been advised to call with any health related questions or concerns.  ? ?Johnney Killian, RN, BSN, CCM ?Care Management Coordinator ?[facility] ?Phone: [phone]/NMM: [phone]  ?

## 2021-06-20 ENCOUNTER — Telehealth: Payer: Self-pay | Admitting: *Deleted

## 2021-06-20 NOTE — Telephone Encounter (Signed)
Spoke with Chelsea [name], she is to have her daughter to return call with information regarding her PCS as to the name of the agency and the amount of hours per month. ?

## 2021-06-20 NOTE — Telephone Encounter (Signed)
Patient called with PCS information. She is getting 80 hours per month / [place] -[phone]. ?

## 2021-06-21 ENCOUNTER — Telehealth: Payer: Self-pay | Admitting: Behavioral Health

## 2021-06-21 NOTE — Telephone Encounter (Signed)
Unable to lv msg for Pt due to Mbox being full. Have tried Pt twice per request of Johnney Killian, RN. Will f/u as able & try to reach Pt again.  ? ?Dr. Theodis Shove ?

## 2021-06-23 ENCOUNTER — Ambulatory Visit: Payer: Self-pay

## 2021-06-23 NOTE — Chronic Care Management (AMB) (Signed)
? ?  06/23/2021 ? ?Chelsea [name] ?03-13-1950 ?[id number] ? ?Returned patients call this afternoon.  She was confused as to when our next appointment was and we reviewed all he upcoming appointments. Patient notes she is doing well. ?Johnney Killian, RN, BSN, CCM ?Care Management Coordinator ?[facility] ?Phone: [phone]/KZG: [phone]  ?

## 2021-06-29 ENCOUNTER — Ambulatory Visit
Admission: RE | Admit: 2021-06-29 | Discharge: 2021-06-29 | Disposition: A | Discharge status: Home / Self Care | Payer: Medicare Other | Source: Ambulatory Visit | Attending: Student in an Organized Health Care Education/Training Program | Admitting: Student in an Organized Health Care Education/Training Program

## 2021-06-29 DIAGNOSIS — Z0000 Encounter for general adult medical examination without abnormal findings: Secondary | ICD-10-CM

## 2021-07-12 ENCOUNTER — Ambulatory Visit: Payer: Medicare Other | Admitting: Behavioral Health

## 2021-07-12 DIAGNOSIS — F331 Major depressive disorder, recurrent, moderate: Secondary | ICD-10-CM

## 2021-07-12 DIAGNOSIS — F419 Anxiety disorder, unspecified: Secondary | ICD-10-CM

## 2021-07-12 NOTE — BH Specialist Note (Signed)
Integrated Behavioral Health via Telemedicine Visit  07/12/2021 Chelsea [name] [id number]  Number of [place] Clinician visits: 3 Session Start time: 1430 Session End time: 1500 Total time in minutes: 30 min  Referring Provider: Dr. Verlin Dike, MD Patient/Family location: Pt is home in private Chelsea [name] Provider location: Chelsea [name] Office All persons participating in visit: Pt & Clinician Types of Service: Individual psychotherapy  I connected with Chelsea [name] and/or Chelsea [name]  self  via  Telephone or Video Enabled Telemedicine Application  (Video is Caregility application) and verified that I am speaking with the correct person using two identifiers. Discussed confidentiality: Yes   I discussed the limitations of telemedicine and the availability of in person appointments.  Discussed there is a possibility of technology failure and discussed alternative modes of communication if that failure occurs.  I discussed that engaging in this telemedicine visit, they consent to the provision of behavioral healthcare and the services will be billed under their insurance.  Patient and/or legal guardian expressed understanding and consented to Telemedicine visit: Yes   Presenting Concerns: Patient and/or family reports the following symptoms/concerns: Pt has just concluded her most recent sessions of PT to assist in her gaining leg strength for possible ambulation. Pt has not walked in over 3 yrs. "My legs shake too much." Pt uses a walker to ambulate.  Duration of problem: over 77yr; Severity of problem: moderate  Patient and/or Family's Strengths/Protective Factors: Social connections, Social and Emotional competence, Concrete supports in place (healthy food, safe environments, etc.), Sense of purpose, and Physical Health (exercise, healthy diet, medication compliance, etc.)  Goals Addressed: Patient will:  Reduce symptoms of: anxiety and depression   Increase  knowledge and/or ability of: coping skills   Demonstrate ability to: Increase healthy adjustment to current life circumstances  Progress towards Goals: Ongoing  Interventions: Interventions utilized:  Supportive Counseling Standardized Assessments completed:  screeners prn  Patient and/or Family Response: Pt is receptive to call today, although Clinician moved appt to 2:30pm due to Chelsea [name]. Pt enjoys our ck-ins & would like a final telehealth visit.   Assessment: Patient currently experiencing reduction in anx/dep due to her Adult Children & their inc'd attn to her current needs. Pt has reached out to them & they have responded. Pt has a great sense of humor, although her Children sometimes c/o her being "mean". Suggested to Pt they do not know what mean is-Pt laughed!  Patient may benefit from cont'd ck-ins through this June & connection to new Provider once @ Chelsea [name]  Plan: Follow up with behavioral health clinician on : late June for 30 min telehealth ck-in visit Behavioral recommendations: Keep your great humor! Keep those children in line! Get out more w/your children's hellp & ask for PT regularly as able. Referral(s): Chelsea [name](In Clinic)  I discussed the assessment and treatment plan with the patient and/or parent/guardian. They were provided an opportunity to ask questions and all were answered. They agreed with the plan and demonstrated an understanding of the instructions.   They were advised to call back or seek an in-person evaluation if the symptoms worsen or if the condition fails to improve as anticipated.  Chelsea Hutching LMFT

## 2021-07-14 ENCOUNTER — Ambulatory Visit: Payer: Medicare Other

## 2021-07-14 NOTE — Patient Instructions (Signed)
Visit Information  Thank you for taking time to visit with me today. Please don't hesitate to contact me if I can be of assistance to you before our next scheduled telephone appointment.  Our next appointment is by telephone on 09/22/21 at 0930.  Please call the care guide team at [phone] if you need to cancel or reschedule your appointment.   If you are experiencing a Mental Health or [place] or need someone to talk to, please call the [place] National Suicide Prevention Lifeline: 778-372-2340 or TTY: (418)008-9383 TTY 5314389636) to talk to a trained counselor   The patient verbalized understanding of instructions, educational materials, and care plan provided today and agreed to receive a mailed copy of patient instructions, educational materials, and care plan.   The patient has been provided with contact information for the care management team and has been advised to call with any health related questions or concerns.   Johnney Killian, RN, BSN, CCM Care Management Coordinator [facility] Phone: [phone]: [phone]

## 2021-07-14 NOTE — Chronic Care Management (AMB) (Addendum)
Care Management    RN Visit Note  08/02/2021 Name: Chelsea [name] MRN: [id number] DOB: 1951/02/03  Subjective: Chelsea [name] is a 71 y.o. year old female who is a primary care patient of Christian, Lucinda Dell, MD. The care management team was consulted for assistance with disease management and care coordination needs.    Engaged with patient by telephone for follow up visit in response to provider referral for case management and/or care coordination services.   Consent to Services:   Ms. Formanek was given information about Care Management services today including:  Care Management services includes personalized support from designated clinical staff supervised by her physician, including individualized plan of care and coordination with other care providers 24/7 contact phone numbers for assistance for urgent and routine care needs. The patient may stop case management services at any time by phone call to the office staff.  Patient agreed to services and consent obtained.   Assessment: Review of patient past medical history, allergies, medications, health status, including review of consultants reports, laboratory and other test data, was performed as part of comprehensive evaluation and provision of chronic care management services.   SDOH (Social Determinants of Health) assessments and interventions performed:    Care Plan  Allergies  Allergen Reactions   Ace Inhibitors Other (See Comments)    Severe hyperkalemia Jan 2023 from [facility], assumed ACEi would have same effect   Aldactone [Spironolactone] Other (See Comments)    Severe hyperkalemia Jan 2023   Angiotensin Receptor Blockers Other (See Comments)    Severe hyperkalemia Jan 2023   Atarax [Hydroxyzine]     Hallucination    Lisinopril Cough   Nsaids Nausea And Vomiting   Penicillins Other (See Comments)    shaking    Outpatient Encounter Medications as of 07/14/2021  Medication Sig   Accu-Chek Softclix Lancets lancets  Use to check blood sugar 2 times per day.   albuterol (VENTOLIN HFA) 108 (90 Base) MCG/ACT inhaler INHALE 2 PUFFS into lungs EVERY 6 HOURS AS NEEDED FOR WHEEZING OR SHORTNESS OF BREATH   amLODipine (NORVASC) 10 MG tablet Take 1 tablet (10 mg total) by mouth daily.   Aspirin 81 MG CAPS Take 81 mg by mouth daily.   atorvastatin (LIPITOR) 80 MG tablet Take 1 tablet (80 mg total) by mouth daily.   Blood Glucose Monitoring Suppl (ACCU-CHEK GUIDE ME) w/Device KIT Use to check blood sugar one time per day.   fluticasone (FLONASE) 50 MCG/ACT nasal spray Place 2 sprays into both nostrils daily. (Patient taking differently: Place 1 spray into both nostrils daily as needed for allergies.)   glucose blood (ACCU-CHEK GUIDE) test strip Use to check blood sugar two times per day.   Lancets Misc. (ACCU-CHEK FASTCLIX LANCET) KIT Use to check blood sugar twice a day as discussed.   liraglutide (VICTOZA) 18 MG/3ML SOPN inject 1.76m into THE SKIN EVERY DAY. (Patient taking differently: 1.2 mg daily.)   Multiple Vitamin (MULTIVITAMIN WITH MINERALS) TABS tablet Take 1 tablet by mouth daily.   Patiromer Sorbitex Calcium (VELTASSA) 25.2 g PACK Take 1 packet by mouth every other day.   sodium bicarbonate 650 MG tablet Take 1 tablet (650 mg total) by mouth 3 (three) times daily.   SURE COMFORT PEN NEEDLES 32G X 4 MM MISC Use once daily to inject victoza   traZODone (DESYREL) 50 MG tablet TAKE 1/2 TO 1 TABLET BY MOUTH NIGHTLY AT BEDTIME (Patient taking differently: Take 50 mg by mouth at bedtime. TAKE 1/2 TO  1 TABLET BY MOUTH NIGHTLY AT BEDTIME)   [DISCONTINUED] carvedilol (COREG) 3.125 MG tablet Take 1 tablet (3.125 mg total) by mouth 2 (two) times daily.   [DISCONTINUED] hydrochlorothiazide (HYDRODIURIL) 25 MG tablet Take 1 tablet (25 mg total) by mouth daily.   [DISCONTINUED] Lancet Device MISC 1 each by Does not apply route 3 (three) times daily.   No facility-administered encounter medications on file as of 07/14/2021.     Patient Active Problem List   Diagnosis Date Noted   Constipation 03/21/2021   Hyperkalemia 03/03/2021   Anemia of chronic disease 10/23/2019   CKD (chronic kidney disease) stage 3, GFR 30-59 ml/min (HCC)    History of stroke 10/06/2017   Depression 08/28/2013   Severe obesity (BMI >= 40) ([place]) 03/12/2013   Primary localized osteoarthritis of knees, bilateral 11/08/2011   Healthcare maintenance 08/15/2010   Hyperlipidemia associated with type 2 diabetes mellitus ([place]) 12/26/2005   Hypertension associated with diabetes ([place]) 12/26/2005   Type 2 diabetes mellitus with diabetic retinopathy ([place]) 02/12/1993    Conditions to be addressed/monitored: HTN, DMII, and CKD Stage 3  Care Plan : RN Care Manager Plan of Care  Updates made by Johnney Killian, RN since 07/14/2021 12:00 AM     Problem: RN Care Manager Plan of Care   Priority: High  Onset Date: 02/17/2021  Note:    Current Barriers: Successful outreach to patient this morning.  She notes she is feeling very well and has no concerns at this time.  We discussed her CBG readings and she provided the last 2 evening readings of 140 and 130 prior to bed.  Reviewed her medications and she is taking as ordered and is not in need of any refills at this time.  Care coordination case closed. Knowledge Deficits related to plan of care for management of HTN, DMII, and CKD Stage 3  Chronic Disease Management support and education needs related to HTN, DMII, and CKD Stage 3  Financial Constraints  RNCM Clinical Goal(s):  Patient will verbalize understanding of plan for management of HTN, DMII, and CKD Stage 3 as evidenced by discussions with provider and RNCM. take all medications exactly as prescribed and will call provider for medication related questions as evidenced by medication review with RNCM. continue to work with RN Care Manager to address care management and care coordination needs related to  HTN, DMII, and CKD Stage 3 as evidenced  by adherence to CM Team Scheduled appointments through collaboration with RN Care manager, provider, and care team.  Interventions: 1:1 collaboration with primary care provider regarding development and update of comprehensive plan of care as evidenced by provider attestation and co-signature Inter-disciplinary care team collaboration (see longitudinal plan of care) Evaluation of current treatment plan related to  self management and patient's adherence to plan as established by provider  Chronic Kidney Disease Interventions:  (Status:  Goal on track:  NO.) Long Term Goal Reviewed medications with patient and discussed importance of compliance    Reviewed scheduled/upcoming provider appointments including    Discussed plans with patient for ongoing care management follow up and provided patient with direct contact information for care management team    Screening for signs and symptoms of depression related to chronic disease state      Discussed the impact of chronic kidney disease on daily life and mental health and acknowledged and normalized feelings of disempowerment, fear, and frustration    Last practice recorded BP readings:  BP Readings from Last 3 Encounters:  05/01/21 (!) 149/56  03/21/21 (!) 145/61  03/13/21 (!) 150/51  Most recent eGFR/CrCl:  Lab Results  Component Value Date   EGFR 37 (L) 05/01/2021    No components found for: CRCL Diabetes Interventions:  (Status:  Goal on track:  Yes.) Long Term Goal Assessed patient's understanding of A1c goal: <7% Reviewed medications with patient and discussed importance of medication adherence Counseled on importance of regular laboratory monitoring as prescribed Discussed plans with patient for ongoing care management follow up and provided patient with direct contact information for care management team Reviewed scheduled/upcoming provider appointments including: Need to make follow up appointment Review of patient status, including  review of consultants reports, relevant laboratory and other test results, and medications completed Lab Results  Component Value Date   HGBA1C 6.6 (H) 03/05/2021  Hypertension Interventions:  (Status:  Goal on track:  NO.) Long Term Goal Last practice recorded BP readings:  BP Readings from Last 3 Encounters:  05/01/21 (!) 149/56  03/21/21 (!) 145/61  03/13/21 (!) 150/51  Most recent eGFR/CrCl:  Lab Results  Component Value Date   EGFR 37 (L) 05/01/2021    No components found for: CRCL  Evaluation of current treatment plan related to hypertension self management and patient's adherence to plan as established by provider Reviewed medications with patient and discussed importance of compliance Discussed plans with patient for ongoing care management follow up and provided patient with direct contact information for care management team Reviewed scheduled/upcoming provider appointments including:   Patient Goals/Self-Care Activities: Take all medications as prescribed Attend all scheduled provider appointments Call pharmacy for medication refills 3-7 days in advance of running out of medications Perform all self care activities independently  Call provider office for new concerns or questions   Follow Up Plan:  The patient has been provided with contact information for the care management team and has been advised to call with any health related questions or concerns.       Plan: The patient has been provided with contact information for the care management team and has been advised to call with any health related questions or concerns.   Johnney Killian, RN, BSN, CCM Care Management Coordinator [facility] Phone: [phone]: [phone]

## 2021-07-19 ENCOUNTER — Other Ambulatory Visit: Payer: Self-pay | Admitting: Internal Medicine
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
## 2021-07-31 ENCOUNTER — Encounter: Payer: Self-pay | Admitting: Nurse Practitioner

## 2021-07-31 ENCOUNTER — Other Ambulatory Visit (INDEPENDENT_AMBULATORY_CARE_PROVIDER_SITE_OTHER): Payer: Medicare Other

## 2021-07-31 ENCOUNTER — Ambulatory Visit (INDEPENDENT_AMBULATORY_CARE_PROVIDER_SITE_OTHER): Payer: Medicare Other | Admitting: Nurse Practitioner

## 2021-07-31 VITALS — BP 140/60 | HR 76 | Ht 64.0 in | Wt 189.6 lb

## 2021-07-31 DIAGNOSIS — Z800 Family history of malignant neoplasm of digestive organs: Secondary | ICD-10-CM | POA: Diagnosis not present

## 2021-07-31 DIAGNOSIS — D649 Anemia, unspecified: Secondary | ICD-10-CM | POA: Diagnosis not present

## 2021-07-31 LAB — COMPREHENSIVE METABOLIC PANEL
ALT: 18 U/L (ref 0–35)
AST: 16 U/L (ref 0–37)
Albumin: 3.9 g/dL (ref 3.5–5.2)
Alkaline Phosphatase: 97 U/L (ref 39–117)
BUN: 38 mg/dL — ABNORMAL HIGH (ref 6–23)
CO2: 26 mEq/L (ref 19–32)
Calcium: 9.5 mg/dL (ref 8.4–10.5)
Chloride: 104 mEq/L (ref 96–112)
Creatinine, Ser: 1.56 mg/dL — ABNORMAL HIGH (ref 0.40–1.20)
GFR: 32.98 mL/min — ABNORMAL LOW (ref >60.00)
Glucose, Bld: 116 mg/dL — ABNORMAL HIGH (ref 70–99)
Potassium: 4.5 mEq/L (ref 3.5–5.1)
Sodium: 140 mEq/L (ref 135–145)
Total Bilirubin: 0.5 mg/dL (ref 0.2–1.2)
Total Protein: 7.9 g/dL (ref 6.0–8.3)

## 2021-07-31 LAB — CBC WITH DIFFERENTIAL/PLATELET
Basophils Absolute: 0.1 10*3/uL (ref 0.0–0.1)
Basophils Relative: 1 % (ref 0.0–3.0)
Eosinophils Absolute: 0.2 10*3/uL (ref 0.0–0.7)
Eosinophils Relative: 3.2 % (ref 0.0–5.0)
HCT: 36.4 % (ref 36.0–46.0)
Hemoglobin: 12 g/dL (ref 12.0–15.0)
Lymphocytes Relative: 29.5 % (ref 12.0–46.0)
Lymphs Abs: 1.9 10*3/uL (ref 0.7–4.0)
MCHC: 32.9 g/dL (ref 30.0–36.0)
MCV: 87.9 fl (ref 78.0–100.0)
Monocytes Absolute: 0.3 10*3/uL (ref 0.1–1.0)
Monocytes Relative: 5.1 % (ref 3.0–12.0)
Neutro Abs: 3.9 10*3/uL (ref 1.4–7.7)
Neutrophils Relative %: 61.2 % (ref 43.0–77.0)
Platelets: 231 10*3/uL (ref 150.0–400.0)
RBC: 4.14 Mil/uL (ref 3.87–5.11)
RDW: 14.3 % (ref 11.5–15.5)
WBC: 6.4 10*3/uL (ref 4.0–10.5)

## 2021-07-31 LAB — B12 AND FOLATE PANEL
Folate: >24.2 ng/mL (ref >5.9)
Vitamin B-12: 598 pg/mL (ref 211–911)

## 2021-07-31 LAB — IBC + FERRITIN
Ferritin: 26.5 ng/mL (ref 10.0–291.0)
Iron: 67 ug/dL (ref 42–145)
Saturation Ratios: 19.6 % — ABNORMAL LOW (ref 20.0–50.0)
TIBC: 341.6 ug/dL (ref 250.0–450.0)
Transferrin: 244 mg/dL (ref 212.0–360.0)

## 2021-07-31 NOTE — Patient Instructions (Addendum)
You have been scheduled for an endoscopy and colonoscopy. Please follow the written instructions given to you at your visit today. Please pick up your prep supplies at the pharmacy within the next 1-3 days. If you use inhalers (even only as needed), please bring them with you on the day of your procedure.   Your provider has requested that you go to the basement level for lab work before leaving today. Press "B" on the elevator. The lab is located at the first door on the left as you exit the elevator.   Take Miralax every night at bedtime as needed  Take Miralax every night at bedtime for 7 nights prior to colonoscopy date  We will cancel the Upper Endoscopy part of your procedures if your Iron levels are normal     Due to recent changes in healthcare laws, you may see the results of your imaging and laboratory studies on MyChart before your provider has had a chance to review them.  We understand that in some cases there may be results that are confusing or concerning to you. Not all laboratory results come back in the same time frame and the provider may be waiting for multiple results in order to interpret others.  Please give [place] 48 hours in order for your provider to thoroughly review all the results before contacting the office for clarification of your results.    If you are age 42 or older, your body mass index should be between 23-30. Your Body mass index is 32.56 kg/m. If this is out of the aforementioned range listed, please consider follow up with your Primary Care Provider.  If you are age 26 or younger, your body mass index should be between 19-25. Your Body mass index is 32.56 kg/m. If this is out of the aformentioned range listed, please consider follow up with your Primary Care Provider.   ________________________________________________________  The [facility] providers would like to encourage you to use [facility] to communicate with providers for non-urgent requests or  questions.  Due to long hold times on the telephone, sending your provider a message by [facility] may be a faster and more efficient way to get a response.  Please allow 48 business hours for a response.  Please remember that this is for non-urgent requests.  _______________________________________________________   Thank you for choosing [facility]  Marcella Dubs

## 2021-08-01 ENCOUNTER — Ambulatory Visit: Payer: Medicare Other | Admitting: Behavioral Health

## 2021-08-01 DIAGNOSIS — F331 Major depressive disorder, recurrent, moderate: Secondary | ICD-10-CM

## 2021-08-01 DIAGNOSIS — F419 Anxiety disorder, unspecified: Secondary | ICD-10-CM

## 2021-08-01 NOTE — BH Specialist Note (Signed)
Integrated Behavioral Health via Telemedicine Visit  08/01/2021 Chelsea [name] [id number]  Number of [place] Clinician visits: 4 Session Start time: 5631 Session End time: 1100 Total time in minutes: 30 min  Referring Provider: Dr. Verlin Dike, MD Patient/Family location: Pt is home in private [facility] Provider location: [facility] Office All persons participating in visit: Pt & Clinician Types of Service: Individual psychotherapy  I connected with Chelsea [name] and/or Chelsea [name]  self  via  Telephone or Video Enabled Telemedicine Application  (Video is Caregility application) and verified that I am speaking with the correct person using two identifiers. Discussed confidentiality: Yes   I discussed the limitations of telemedicine and the availability of in person appointments.  Discussed there is a possibility of technology failure and discussed alternative modes of communication if that failure occurs.  I discussed that engaging in this telemedicine visit, they consent to the provision of behavioral healthcare and the services will be billed under their insurance.  Patient and/or legal guardian expressed understanding and consented to Telemedicine visit: Yes   Presenting Concerns: Patient and/or family reports the following symptoms/concerns: Pt is anxious for upcoming colonoscopy procedure. She is concerned for having a ramp access to her home. Pt c/o low energy. Duration of problem: months now; Severity of problem: moderate  Patient and/or Family's Strengths/Protective Factors: Social connections, Social and Emotional competence, Concrete supports in place (healthy food, safe environments, etc.), and Sense of purpose  Goals Addressed: Patient will:  Reduce symptoms of: anxiety and depression   Increase knowledge and/or ability of: coping skills   Demonstrate ability to: Increase healthy adjustment to current life circumstances  Progress towards  Goals: Ongoing  Interventions: Interventions utilized:  Supportive Counseling Standardized Assessments completed:  screeners prn  Patient and/or Family Response: Pt receptive to call today  Assessment: Patient currently experiencing sense of depression due to her health issues & being alone. Pt has a PCA who visits daily. [place] visit almost daily.  Patient may benefit from contact w/the [facility] @ [phone].   Plan: Follow up with behavioral health clinician on : TBD Behavioral recommendations: Care for self & ask for help Referral(s):  [facility] @ [phone]   I discussed the assessment and treatment plan with the patient and/or parent/guardian. They were provided an opportunity to ask questions and all were answered. They agreed with the plan and demonstrated an understanding of the instructions.   They were advised to call back or seek an in-person evaluation if the symptoms worsen or if the condition fails to improve as anticipated.  Donnetta Hutching, LMFT

## 2021-08-07 ENCOUNTER — Encounter: Payer: Self-pay | Admitting: *Deleted

## 2021-08-17 ENCOUNTER — Encounter: Payer: Medicare Other | Admitting: Gastroenterology

## 2021-09-11 ENCOUNTER — Encounter: Payer: Self-pay | Admitting: Internal Medicine

## 2021-09-11 ENCOUNTER — Other Ambulatory Visit: Payer: Self-pay | Admitting: Internal Medicine

## 2021-09-11 ENCOUNTER — Ambulatory Visit (INDEPENDENT_AMBULATORY_CARE_PROVIDER_SITE_OTHER): Payer: Medicare Other | Admitting: Internal Medicine

## 2021-09-11 VITALS — BP 144/71 | HR 76 | Temp 98.2°F | Ht 64.0 in | Wt 192.4 lb

## 2021-09-11 DIAGNOSIS — D638 Anemia in other chronic diseases classified elsewhere: Secondary | ICD-10-CM

## 2021-09-11 DIAGNOSIS — E1169 Type 2 diabetes mellitus with other specified complication: Secondary | ICD-10-CM

## 2021-09-11 DIAGNOSIS — E785 Hyperlipidemia, unspecified: Secondary | ICD-10-CM

## 2021-09-11 DIAGNOSIS — E113593 Type 2 diabetes mellitus with proliferative diabetic retinopathy without macular edema, bilateral: Secondary | ICD-10-CM | POA: Diagnosis not present

## 2021-09-11 DIAGNOSIS — Z794 Long term (current) use of insulin: Secondary | ICD-10-CM

## 2021-09-11 DIAGNOSIS — E1159 Type 2 diabetes mellitus with other circulatory complications: Secondary | ICD-10-CM | POA: Diagnosis not present

## 2021-09-11 DIAGNOSIS — F325 Major depressive disorder, single episode, in full remission: Secondary | ICD-10-CM

## 2021-09-11 DIAGNOSIS — E669 Obesity, unspecified: Secondary | ICD-10-CM

## 2021-09-11 DIAGNOSIS — E875 Hyperkalemia: Secondary | ICD-10-CM

## 2021-09-11 DIAGNOSIS — I152 Hypertension secondary to endocrine disorders: Secondary | ICD-10-CM

## 2021-09-11 LAB — POCT GLYCOSYLATED HEMOGLOBIN (HGB A1C): Hemoglobin A1C: 6.7 % — AB (ref 4.0–5.6)

## 2021-09-11 LAB — GLUCOSE, CAPILLARY: Glucose-Capillary: 129 mg/dL — ABNORMAL HIGH (ref 70–99)

## 2021-09-11 MED ORDER — CARVEDILOL 6.25 MG PO TABS: 6.2500 mg | ORAL_TABLET | Freq: Two times a day (BID) | ORAL | 2 refills | Status: DC

## 2021-09-11 NOTE — Assessment & Plan Note (Signed)
Most recent lipid profile in September 2022 showed LDL at goal at 57.  She is currently taking atorvastatin 80 mg daily without trouble. Plan: Recheck lipid profile today.  Continue atorvastatin 80 mg daily.

## 2021-09-11 NOTE — Assessment & Plan Note (Signed)
HbA1c today of 6.7%, stable from 6.6% 6 months ago.  She checks blood sugar at home once daily in the evening and her levels range from 140s to 200s.  She is currently managed with Victoza 1.2 mg daily.  Assessment: Overall control appears stable at this time. Plan: I have asked the patient to check her blood sugar once daily in the morning before eating rather than in the evening and to bring a log of these values to her next visit.  Continue Victoza 1.2 mg daily.  Counseled on healthy lifestyle choices.

## 2021-09-11 NOTE — Assessment & Plan Note (Addendum)
Patient does not check her BP at home. Current management includes amlodipine, carvedilol, HCTZ and she reports taking her medication as prescribed. She does not experience significant dizziness and has not had any falls as she does not ambulate. Initial BP check was 155/58, repeat of 144/71.   Assessment:BP is uncontrolled on current regimen. She does occasionally experience mild dizziness but is not regularly ambulatory so I feel she is a low fall risk. Plan: Repeat BMP today. Patient has been instructed to increase carvedilol to 6.25 mg BID and stop Patiromer packets. RTC in 2 weeks for BP re-check

## 2021-09-11 NOTE — Assessment & Plan Note (Addendum)
At previous visit, patient was experiencing hyperkalemia in the setting of ARB and spironolactone use and was recommended to begin taking Patiromer packets every other day with follow-up in 2 weeks however has not been back to the clinic since that time. She explains that that she is not currently taking this packet more than 2-3 times per month due to the side effect of severe constipation.  She was evaluated by GI in June to schedule a colonoscopy and labs drawn at that visit showed potassium of 4.5 despite regular use of Patiromer packets. Assessment: Patient does not regularly use potassium lowering agents and was recently noted to be within normal limits for potassium level. Plan: We will recheck BMP today.  I have counseled patient to discontinue taking Patiromer at this time.

## 2021-09-11 NOTE — Assessment & Plan Note (Signed)
Patient reports that her mental health is good at this time and she does not endorse concerns regarding her depression.  She is currently taking trazodone at bedtime and tolerating this well.  She also follows with behavioral health.  She does endorse some anxiety about an upcoming colonoscopy due to the fact that it is a procedure that she will be going under but this is the only active true that she reports this time. Assessment: Patient appears to be stable at this time. Plan: Continue trazodone each night and following with behavioral health therapy.

## 2021-09-11 NOTE — Assessment & Plan Note (Signed)
Patient has been noted to have anemia on several recent lab draws, however in June when she was evaluated by GI for upcoming colonoscopy her hemoglobin was noted to be normal at 12.0.  Iron panel collected at that time was also within normal limits in addition to vitamin B12 and folate levels. Plan: Continue to monitor.  Patient counseled on signs of bleeding and seeking further evaluation if these are noted.
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
## 2021-09-11 NOTE — Patient Instructions (Addendum)
Chelsea [name],  It was a pleasure to meet you today. We discussed several aspects of your health today:  Blood pressure: Your blood pressure was a little high today. Please keep taking amlodipine and hydrochlorothiazide at their current doses. I would like you to increase your dose of carvedilol to 6.25 mg twice daily. I am checking labs to make sure that your potassium levels are okay as well.  Diabetes: Your HbA1c was 6.7% today which is about the same as it was several months ago. Keep taking Victoza 1.2 mg daily and trying to eat a healthy diet. I would like you to check your blood sugar first thing in the morning before you eat, instead of at night, if you can.  High cholesterol: I am rechecking your cholesterol levels today. Keep taking atorvastatin.  I would like you to follow-up in two weeks to check your blood pressure.   My best, Dr. Marlou Sa

## 2021-09-12 ENCOUNTER — Other Ambulatory Visit: Payer: Self-pay | Admitting: Internal Medicine

## 2021-09-12 DIAGNOSIS — E113593 Type 2 diabetes mellitus with proliferative diabetic retinopathy without macular edema, bilateral: Secondary | ICD-10-CM

## 2021-09-12 LAB — BASIC METABOLIC PANEL
BUN/Creatinine Ratio: 30 — ABNORMAL HIGH (ref 12–28)
BUN: 40 mg/dL — ABNORMAL HIGH (ref 8–27)
CO2: 20 mmol/L (ref 20–29)
Calcium: 9.4 mg/dL (ref 8.7–10.3)
Chloride: 104 mmol/L (ref 96–106)
Creatinine, Ser: 1.34 mg/dL — ABNORMAL HIGH (ref 0.57–1.00)
Glucose: 126 mg/dL — ABNORMAL HIGH (ref 70–99)
Potassium: 4.8 mmol/L (ref 3.5–5.2)
Sodium: 141 mmol/L (ref 134–144)
eGFR: 42 mL/min/{1.73_m2} — ABNORMAL LOW (ref >59)

## 2021-09-12 LAB — LIPID PANEL
Chol/HDL Ratio: 2.6 ratio (ref 0.0–4.4)
Cholesterol, Total: 151 mg/dL (ref 100–199)
HDL: 57 mg/dL (ref >39)
LDL Chol Calc (NIH): 82 mg/dL (ref 0–99)
Triglycerides: 61 mg/dL (ref 0–149)
VLDL Cholesterol Cal: 12 mg/dL (ref 5–40)

## 2021-09-13 ENCOUNTER — Telehealth: Payer: Self-pay | Admitting: Internal Medicine

## 2021-09-13 NOTE — Telephone Encounter (Signed)
Attempted to contact patient to let her know that her lab results came back and looked okay. The call was picked up and immediately ended. There is no follow-up for her lab results at this time, but I do not see that a BP re-check was scheduled for 2 weeks. I will reach out to the front desk to assist in scheduling that appointment at this time.  Farrel Gordon, DO Internal Medicine PGY-2

## 2021-09-14 ENCOUNTER — Telehealth: Payer: Self-pay | Admitting: *Deleted

## 2021-09-14 NOTE — Telephone Encounter (Signed)
Call from patient wanted to get clarification on her Coreg dosing.  Discussed with patient and also appointment for follow up in 2 weeks.

## 2021-09-14 NOTE — Progress Notes (Signed)
Internal Medicine Clinic Attending  Case discussed with the resident at the time of the visit.  We reviewed the resident's history and exam and pertinent patient test results.  I agree with the assessment, diagnosis, and plan of care documented in the resident's note.  

## 2021-09-22 ENCOUNTER — Telehealth: Payer: Medicare Other

## 2021-09-25 ENCOUNTER — Other Ambulatory Visit: Payer: Self-pay | Admitting: Internal Medicine

## 2021-09-25 ENCOUNTER — Encounter: Payer: Medicare Other | Admitting: Internal Medicine

## 2021-09-25 DIAGNOSIS — F329 Major depressive disorder, single episode, unspecified: Secondary | ICD-10-CM

## 2021-09-25 DIAGNOSIS — E1159 Type 2 diabetes mellitus with other circulatory complications: Secondary | ICD-10-CM

## 2021-09-25 DIAGNOSIS — G4700 Insomnia, unspecified: Secondary | ICD-10-CM
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
## 2021-09-26 ENCOUNTER — Encounter: Payer: Self-pay | Admitting: Gastroenterology

## 2021-09-26 ENCOUNTER — Encounter: Payer: Self-pay | Admitting: Internal Medicine

## 2021-09-29 ENCOUNTER — Encounter: Payer: Medicare Other | Admitting: Gastroenterology
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
## 2021-10-04 ENCOUNTER — Ambulatory Visit (INDEPENDENT_AMBULATORY_CARE_PROVIDER_SITE_OTHER): Payer: Medicare Other | Admitting: Student

## 2021-10-04 VITALS — BP 148/56 | HR 73 | Temp 98.2°F | Wt 195.7 lb

## 2021-10-04 DIAGNOSIS — E1169 Type 2 diabetes mellitus with other specified complication: Secondary | ICD-10-CM | POA: Diagnosis not present

## 2021-10-04 DIAGNOSIS — I152 Hypertension secondary to endocrine disorders: Secondary | ICD-10-CM | POA: Diagnosis not present

## 2021-10-04 DIAGNOSIS — E875 Hyperkalemia: Secondary | ICD-10-CM

## 2021-10-04 DIAGNOSIS — E785 Hyperlipidemia, unspecified: Secondary | ICD-10-CM | POA: Diagnosis not present

## 2021-10-04 DIAGNOSIS — W19XXXA Unspecified fall, initial encounter: Secondary | ICD-10-CM

## 2021-10-04 DIAGNOSIS — E1159 Type 2 diabetes mellitus with other circulatory complications: Secondary | ICD-10-CM | POA: Diagnosis not present

## 2021-10-04 MED ORDER — CARVEDILOL 6.25 MG PO TABS: 12.5000 mg | ORAL_TABLET | Freq: Two times a day (BID) | ORAL | 2 refills | Status: DC

## 2021-10-04 MED ORDER — BLOOD PRESSURE CUFF MISC: 1.0000 | Freq: Every day | 0 refills | Status: DC | PRN

## 2021-10-04 NOTE — Assessment & Plan Note (Signed)
Patient sustained a mechanical fall yesterday. She was not lightheaded or dizzy and fell when transferring from walker to chair. Denies hitting her head, loss of consciousness, headache, visual disturbances, and syncope.  -Encourage caution and personal assistance if possible when ambulating or transferring

## 2021-10-04 NOTE — Assessment & Plan Note (Addendum)
Currently takes amlodipine 10 mg q24, carvedilol 6.25 mg q12, and hydrochlorothiazide '25mg'$  q24 blood pressure.  At her last visit, carvedilol was increased to the current dose of 6.25 mg twice per day.  Taking his medications as prescribed.  Pressure today in clinic was 148/56 and repeat was 145/60.  Her blood pressure is above our target range of 130/80. She is intolerant to ACE inhibitors, ARB's, and spironolactone.  Her heart rate today was in the 70s, so increasing carvedilol remains an option.  -Increase carvedilol from 6.25 mg twice per day to 12.5 mg twice per day -Order blood pressure cuff to enable at-home measurements -Return to clinic in one month for blood pressure reevaluation and BMP

## 2021-10-04 NOTE — Patient Instructions (Signed)
  Thank you, Ms.[name], for allowing [place] to provide your care today. Today we discussed . . .  > Hypertension       - your blood pressure today was high, we are increasing your dose of carvedilol to 12.'5mg'$  twice per day       - please invest in a blood pressure cuff and start taking your blood pressure at home > Kidney Disease       - we have ordered some lab tests to check potassium, we may want to adjust your medications at your next visit   I have ordered the following labs for you:  Lab Orders  No laboratory test(s) ordered today      Tests ordered today:  None   Referrals ordered today:   Referral Orders  No referral(s) requested today      I have ordered the following medication/changed the following medications:   Stop the following medications: There are no discontinued medications.   Start the following medications: No orders of the defined types were placed in this encounter.     Follow up: 1 month    Remember:  Please continue to take your BP meds and we will see you back in about one month!   Should you have any questions or concerns please call the internal medicine clinic at [phone].     Roswell Nickel, MD [place]

## 2021-10-04 NOTE — Assessment & Plan Note (Signed)
Patient has a history of hyperkalemia dating back to March 2022.  She was recently prescribed Patiromer, which discontinued at her last visit.  Her BMP at that same visit was 4.8.   -Continue to hold Patiromer and recheck BMP including potassium at next visit in one month

## 2021-10-05 ENCOUNTER — Other Ambulatory Visit: Payer: Self-pay | Admitting: *Deleted

## 2021-10-05 DIAGNOSIS — E1159 Type 2 diabetes mellitus with other circulatory complications: Secondary | ICD-10-CM

## 2021-10-05 LAB — BMP8+ANION GAP
Anion Gap: 17 mmol/L (ref 10.0–18.0)
BUN/Creatinine Ratio: 24 (ref 12–28)
BUN: 36 mg/dL — ABNORMAL HIGH (ref 8–27)
CO2: 21 mmol/L (ref 20–29)
Calcium: 8.9 mg/dL (ref 8.7–10.3)
Chloride: 103 mmol/L (ref 96–106)
Creatinine, Ser: 1.51 mg/dL — ABNORMAL HIGH (ref 0.57–1.00)
Glucose: 138 mg/dL — ABNORMAL HIGH (ref 70–99)
Potassium: 4.7 mmol/L (ref 3.5–5.2)
Sodium: 141 mmol/L (ref 134–144)
eGFR: 37 mL/min/{1.73_m2} — ABNORMAL LOW (ref >59)

## 2021-10-05 MED ORDER — CARVEDILOL 6.25 MG PO TABS: 12.5000 mg | ORAL_TABLET | Freq: Two times a day (BID) | ORAL | 0 refills | Status: DC

## 2021-10-05 NOTE — Addendum Note (Signed)
Addended by: Charise Killian on: 10/05/2021 11:49 AM   Modules accepted: Level of Service

## 2021-10-05 NOTE — Progress Notes (Signed)
Internal Medicine Clinic Attending  I saw and evaluated the patient.  I personally confirmed the key portions of the history and exam documented by Dr. Harper and I reviewed pertinent patient test results.  The assessment, diagnosis, and plan were formulated together and I agree with the documentation in the resident's note.  

## 2021-10-05 NOTE — Telephone Encounter (Signed)
Call from [facility] for Carvedilol  6.25 mg Take 2 tablets (12.5 mg total) by mouth 2 (two) times daily with a meal qty 60 tabs which will only last for 15 days. Requesting new rx for qty of 120 to last 30 days. Thanks

## 2021-10-10 ENCOUNTER — Telehealth: Payer: Self-pay | Admitting: *Deleted

## 2021-10-10 NOTE — Telephone Encounter (Signed)
Patient no show/answer PV appointment for today. I called patient, no answer, left a message for the patient to call us back today before 5 pm or the PV and procedure will be cancelled.  

## 2021-10-10 NOTE — Telephone Encounter (Signed)
Pt rescheduled PV for 10/13/21.

## 2021-10-13 ENCOUNTER — Ambulatory Visit (AMBULATORY_SURGERY_CENTER): Payer: Self-pay

## 2021-10-13 VITALS — Ht 65.0 in | Wt 195.0 lb

## 2021-10-13 DIAGNOSIS — D649 Anemia, unspecified: Secondary | ICD-10-CM

## 2021-10-13 NOTE — Progress Notes (Signed)
No egg or soy allergy known to patient  No issues known to pt with past sedation with any surgeries or procedures Patient denies ever being told they had issues or difficulty with intubation  No FH of Malignant Hyperthermia Pt is not on diet pills Pt is not on home 02  Pt is not on blood thinners  Pt denies issues with constipation  No A fib or A flutter Have any cardiac testing pending--NO Pt instructed to use Singlecare.com or GoodRx for a price reduction on prep  [employer] Medicare and Medicaid insurance verified during PV appt;

## 2021-10-18 ENCOUNTER — Other Ambulatory Visit: Payer: Self-pay | Admitting: Internal Medicine

## 2021-10-18 DIAGNOSIS — E1159 Type 2 diabetes mellitus with other circulatory complications: Secondary | ICD-10-CM

## 2021-10-20 ENCOUNTER — Ambulatory Visit: Payer: Medicare Other | Admitting: Podiatry

## 2021-10-24 ENCOUNTER — Ambulatory Visit (INDEPENDENT_AMBULATORY_CARE_PROVIDER_SITE_OTHER): Payer: Medicare Other | Admitting: Podiatry

## 2021-10-24 DIAGNOSIS — B351 Tinea unguium: Secondary | ICD-10-CM

## 2021-10-24 DIAGNOSIS — M79674 Pain in right toe(s): Secondary | ICD-10-CM

## 2021-10-24 DIAGNOSIS — Q828 Other specified congenital malformations of skin: Secondary | ICD-10-CM

## 2021-10-24 DIAGNOSIS — E113593 Type 2 diabetes mellitus with proliferative diabetic retinopathy without macular edema, bilateral: Secondary | ICD-10-CM

## 2021-10-24 DIAGNOSIS — M79675 Pain in left toe(s): Secondary | ICD-10-CM

## 2021-10-24 DIAGNOSIS — N1832 Chronic kidney disease, stage 3b: Secondary | ICD-10-CM

## 2021-10-24 DIAGNOSIS — Z794 Long term (current) use of insulin: Secondary | ICD-10-CM

## 2021-10-24 NOTE — Progress Notes (Signed)
This patient returns to my office for at risk foot care.  This patient requires this care by a professional since this patient will be at risk due to having CVA chronic kidney disease and diabetes.  This patient is unable to cut nails herself since the patient cannot reach her nails.These nails are painful walking and wearing shoes. This patient is returning with her caregiver in a wheelchair.  This patient presents for at risk foot care today.  General Appearance  Alert, conversant and in no acute stress.  Vascular  Dorsalis pedis and posterior tibial  pulses are weakly  palpable  bilaterally.  Capillary return is within normal limits  bilaterally. Temperature is within normal limits  bilaterally.  Neurologic  Senn-Weinstein monofilament wire test diminished  bilaterally. Muscle power within normal limits bilaterally.  Nails Thick disfigured discolored nails with subungual debris  from hallux to fifth toes bilaterally. No evidence of bacterial infection or drainage bilaterally.  Orthopedic  No limitations of motion  feet .  No crepitus or effusions noted.  No bony pathology or digital deformities noted.  DJD 1st MPJ  B/L.   Skin  normotropic skin with  porokeratosis  under 1st MPJ  B/L.noted bilaterally.  No signs of infections or ulcers noted.   Callus sub 1 right foot.  Onychomycosis  Pain in right toes  Pain in left toes  Callus right foot.  Consent was obtained for treatment procedures.   Mechanical debridement of nails 1-5  bilaterally performed with a nail nipper.  Filed with dremel without incident.  Debride callus with dremel tool.   Return office visit   3 months                   Told patient to return for periodic foot care and evaluation due to potential at risk complications.   Gardiner Barefoot DPM .

## 2021-11-03 ENCOUNTER — Ambulatory Visit (AMBULATORY_SURGERY_CENTER): Payer: Medicare Other | Admitting: Gastroenterology

## 2021-11-03 ENCOUNTER — Encounter: Payer: Self-pay | Admitting: Gastroenterology

## 2021-11-03 VITALS — BP 179/80 | HR 73 | Temp 98.4°F | Resp 12 | Ht 64.0 in | Wt 195.0 lb

## 2021-11-03 DIAGNOSIS — Z1211 Encounter for screening for malignant neoplasm of colon: Secondary | ICD-10-CM | POA: Diagnosis not present

## 2021-11-03 DIAGNOSIS — D649 Anemia, unspecified: Secondary | ICD-10-CM

## 2021-11-03 MED ORDER — SODIUM CHLORIDE 0.9 % IV SOLN: 500.0000 mL | INTRAVENOUS | Status: DC

## 2021-11-03 NOTE — Patient Instructions (Signed)
HANDOUT ON DIVERTICULOSIS GIVEN.   GIVEN PATIENT'S AGE, CO-MORBIDITIES AND LACK OF POLYPS, RECOMMEND AGAINST ANY FURTHER COLON CANCER SCREENING.  HIGH FIBER DIET.    YOU HAD AN ENDOSCOPIC PROCEDURE TODAY AT [facility]:   Refer to the procedure report that was given to you for any specific questions about what was found during the examination.  If the procedure report does not answer your questions, please call your gastroenterologist to clarify.  If you requested that your care partner not be given the details of your procedure findings, then the procedure report has been included in a sealed envelope for you to review at your convenience later.  YOU SHOULD EXPECT: Some feelings of bloating in the abdomen. Passage of more gas than usual.  Walking can help get rid of the air that was put into your GI tract during the procedure and reduce the bloating. If you had a lower endoscopy (such as a colonoscopy or flexible sigmoidoscopy) you may notice spotting of blood in your stool or on the toilet paper. If you underwent a bowel prep for your procedure, you may not have a normal bowel movement for a few days.  Please Note:  You might notice some irritation and congestion in your nose or some drainage.  This is from the oxygen used during your procedure.  There is no need for concern and it should clear up in a day or so.  SYMPTOMS TO REPORT IMMEDIATELY:  Following lower endoscopy (colonoscopy or flexible sigmoidoscopy):  Excessive amounts of blood in the stool  Significant tenderness or worsening of abdominal pains  Swelling of the abdomen that is new, acute  Fever of 100F or higher  For urgent or emergent issues, a gastroenterologist can be reached at any hour by calling [phone]. Do not use MyChart messaging for urgent concerns.    DIET:  We do recommend a small meal at first, but then you may proceed to your regular diet.  Drink plenty of fluids but you should avoid  alcoholic beverages for 24 hours.  ACTIVITY:  You should plan to take it easy for the rest of today and you should NOT DRIVE or use heavy machinery until tomorrow (because of the sedation medicines used during the test).    FOLLOW UP: Our staff will call the number listed on your records the next business day following your procedure.  We will call around 7:15- 8:00 am to check on you and address any questions or concerns that you may have regarding the information given to you following your procedure. If we do not reach you, we will leave a message.     If any biopsies were taken you will be contacted by phone or by letter within the next 1-3 weeks.  Please call us at [phone] if you have not heard about the biopsies in 3 weeks.    SIGNATURES/CONFIDENTIALITY: You and/or your care partner have signed paperwork which will be entered into your electronic medical record.  These signatures attest to the fact that that the information above on your After Visit Summary has been reviewed and is understood.  Full responsibility of the confidentiality of this discharge information lies with you and/or your care-partner.
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
## 2021-11-03 NOTE — Op Note (Signed)
[place] Patient Name: Chelsea [name] Procedure Date: 11/03/2021 10:04 AM MRN: [id number] Endoscopist: Nicki Reaper E. Candis Schatz , MD Age: 71 Referring MD:  Date of Birth: 07-Jan-1951 Gender: Female Account #: [id number] Procedure:                Colonoscopy Indications:              Screening for colorectal malignant neoplasm (last                            colonoscopy was more than 10 years ago) Medicines:                Monitored Anesthesia Care Procedure:                Pre-Anesthesia Assessment:                           - Prior to the procedure, a History and Physical                            was performed, and patient medications and                            allergies were reviewed. The patient's tolerance of                            previous anesthesia was also reviewed. The risks                            and benefits of the procedure and the sedation                            options and risks were discussed with the patient.                            All questions were answered, and informed consent                            was obtained. Prior Anticoagulants: The patient has                            taken no previous anticoagulant or antiplatelet                            agents except for aspirin. ASA Grade Assessment:                            III - A patient with severe systemic disease. After                            reviewing the risks and benefits, the patient was                            deemed in satisfactory condition to undergo the  procedure.                           After obtaining informed consent, the colonoscope                            was passed under direct vision. Throughout the                            procedure, the patient's blood pressure, pulse, and                            oxygen saturations were monitored continuously. The                            Olympus CF-HQ190L ([id number]) Colonoscope  was                            introduced through the anus and advanced to the the                            cecum, identified by appendiceal orifice and                            ileocecal valve. The colonoscopy was performed                            without difficulty. The patient tolerated the                            procedure well. The quality of the bowel                            preparation was adequate. The ileocecal valve,                            appendiceal orifice, and rectum were photographed.                            The bowel preparation used was Miralax via split                            dose instruction. Scope In: 10:17:00 AM Scope Out: 10:35:27 AM Scope Withdrawal Time: 0 hours 12 minutes 20 seconds  Total Procedure Duration: 0 hours 18 minutes 27 seconds  Findings:                 The perianal and digital rectal examinations were                            normal. Pertinent negatives include normal                            sphincter tone and no palpable rectal lesions.  Many small and large-mouthed diverticula were found                            in the sigmoid colon, descending colon, ascending                            colon and cecum. There was evidence of an impacted                            diverticulum. There was no evidence of diverticular                            bleeding.                           The exam was otherwise normal throughout the                            examined colon.                           The retroflexed view of the distal rectum and anal                            verge was normal and showed no anal or rectal                            abnormalities. Complications:            No immediate complications. Estimated Blood Loss:     Estimated blood loss: none. Impression:               - Severe diverticulosis in the sigmoid colon, in                            the descending colon, in the  ascending colon and in                            the cecum. There was evidence of an impacted                            diverticulum. There was no evidence of diverticular                            bleeding.                           - The distal rectum and anal verge are normal on                            retroflexion view.                           - No specimens collected. Recommendation:           - Patient has a contact number available for  emergencies. The signs and symptoms of potential                            delayed complications were discussed with the                            patient. Return to normal activities tomorrow.                            Written discharge instructions were provided to the                            patient.                           - Resume previous diet.                           - Continue present medications.                           - Given patient's age, comorbidities and lack of                            polyps, recommend against any further colon cancer                            screening.                           - High fiber diet Zyrus Hetland E. Candis Schatz, MD 11/03/2021 10:42:18 AM This report has been signed electronically.

## 2021-11-03 NOTE — Progress Notes (Signed)
A/ox3, pleased with MAC, report to RN 

## 2021-11-06 ENCOUNTER — Telehealth: Payer: Self-pay

## 2021-11-06 NOTE — Telephone Encounter (Signed)
No answer on follow up call. Voicemail full.  

## 2021-11-16 ENCOUNTER — Other Ambulatory Visit: Payer: Self-pay | Admitting: Internal Medicine

## 2021-11-16 DIAGNOSIS — F329 Major depressive disorder, single episode, unspecified: Secondary | ICD-10-CM

## 2021-11-16 DIAGNOSIS — I152 Hypertension secondary to endocrine disorders: Secondary | ICD-10-CM

## 2021-12-12 ENCOUNTER — Other Ambulatory Visit: Payer: Self-pay | Admitting: Internal Medicine

## 2021-12-12 DIAGNOSIS — Z794 Long term (current) use of insulin: Secondary | ICD-10-CM

## 2021-12-18 ENCOUNTER — Other Ambulatory Visit: Payer: Self-pay | Admitting: Student

## 2021-12-18 ENCOUNTER — Other Ambulatory Visit: Payer: Self-pay | Admitting: Internal Medicine

## 2021-12-18 DIAGNOSIS — Z8673 Personal history of transient ischemic attack (TIA), and cerebral infarction without residual deficits: Secondary | ICD-10-CM

## 2021-12-18 DIAGNOSIS — I152 Hypertension secondary to endocrine disorders: Secondary | ICD-10-CM

## 2021-12-18 DIAGNOSIS — F329 Major depressive disorder, single episode, unspecified: Secondary | ICD-10-CM

## 2021-12-18 NOTE — Telephone Encounter (Signed)
D/c'd medication refill request.  ASA refill also requested.

## 2021-12-21 ENCOUNTER — Other Ambulatory Visit: Payer: Self-pay | Admitting: Internal Medicine

## 2021-12-21 DIAGNOSIS — E1159 Type 2 diabetes mellitus with other circulatory complications: Secondary | ICD-10-CM

## 2022-01-09 ENCOUNTER — Encounter: Payer: Medicare Other | Admitting: Internal Medicine

## 2022-01-09 NOTE — Progress Notes (Deleted)
HTN: BP at goal today, *.  Current regimen is amlodipine 10 mg daily, carvedilol 12.5 mg BID, HCTZ 25 mg daily. On chart review it appears that she is unable to tolerate ACEIs, ARBs, and spironolactone. Plan:  HLD  T2DM: Last HbA1c 08/2021 was 6.7%; today is *. Current regimen is victoza 1.2 mg daily.  OA  CKD3  ANEMIA CKD  DEPRESSION  HX CVA  HYPERK: Patient asked to hold patiromer at last OV 09/2021 due consistently normal levels. K was last checked at that visit and was 4.7. Plan:  *flu Eye

## 2022-01-15 ENCOUNTER — Other Ambulatory Visit: Payer: Self-pay | Admitting: Internal Medicine

## 2022-01-15 ENCOUNTER — Ambulatory Visit (INDEPENDENT_AMBULATORY_CARE_PROVIDER_SITE_OTHER): Payer: Medicare Other | Admitting: Internal Medicine

## 2022-01-15 ENCOUNTER — Encounter: Payer: Self-pay | Admitting: Internal Medicine

## 2022-01-15 VITALS — BP 167/79 | HR 72 | Ht 64.0 in | Wt 203.9 lb

## 2022-01-15 DIAGNOSIS — E1122 Type 2 diabetes mellitus with diabetic chronic kidney disease: Secondary | ICD-10-CM

## 2022-01-15 DIAGNOSIS — N1832 Chronic kidney disease, stage 3b: Secondary | ICD-10-CM | POA: Diagnosis not present

## 2022-01-15 DIAGNOSIS — Z794 Long term (current) use of insulin: Secondary | ICD-10-CM | POA: Diagnosis not present

## 2022-01-15 DIAGNOSIS — E1159 Type 2 diabetes mellitus with other circulatory complications: Secondary | ICD-10-CM

## 2022-01-15 DIAGNOSIS — E113593 Type 2 diabetes mellitus with proliferative diabetic retinopathy without macular edema, bilateral: Secondary | ICD-10-CM | POA: Diagnosis not present

## 2022-01-15 DIAGNOSIS — E1169 Type 2 diabetes mellitus with other specified complication: Secondary | ICD-10-CM | POA: Diagnosis not present

## 2022-01-15 DIAGNOSIS — E785 Hyperlipidemia, unspecified: Secondary | ICD-10-CM

## 2022-01-15 DIAGNOSIS — I152 Hypertension secondary to endocrine disorders: Secondary | ICD-10-CM

## 2022-01-15 LAB — POCT GLYCOSYLATED HEMOGLOBIN (HGB A1C): Hemoglobin A1C: 7 % — AB (ref 4.0–5.6)

## 2022-01-15 LAB — GLUCOSE, CAPILLARY: Glucose-Capillary: 130 mg/dL — ABNORMAL HIGH (ref 70–99)

## 2022-01-15 MED ORDER — CHLORTHALIDONE 25 MG PO TABS: 25.0000 mg | ORAL_TABLET | Freq: Every day | ORAL | 2 refills | Status: DC

## 2022-01-15 MED ORDER — LIRAGLUTIDE 18 MG/3ML ~~LOC~~ SOPN: 1.8000 mg | PEN_INJECTOR | Freq: Every day | SUBCUTANEOUS | 2 refills | Status: DC

## 2022-01-15 NOTE — Patient Instructions (Addendum)
Ms. Dickens,  It was a pleasure to care for you today!  I will let you know what your lab results show. I have also placed a referral for you to get back on track with annual eye exams.  I am going to change two of your medicines: Victoza--increase to 1.8 mg injection daily STOP hydrochlorothiazide START chlorthalidone 25 mg daily  Please follow up in about 1 month for blood pressure recheck.  Happy Holidays! Dr. Marlou Sa

## 2022-01-15 NOTE — Assessment & Plan Note (Signed)
Last lipid panel 08/2021 with LDL above goal, 82.  Current regimen is atorvastatin 80 mg daily. Plan:Recheck lipid panel today. Consider addition of zetia if LDL remains above goal of <70.
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
## 2022-01-15 NOTE — Assessment & Plan Note (Signed)
BP above goal, 151/84, with recheck of 167/79. Current regimen is amlodipine 10 mg daily, carvedilol 12.5 mg BID, HCTZ 25 mg daily. She recently purchased a home BP cuff so that she can keep a log but has not started checking at home yet. She has documented allergies/intolerances to ACEIs, ARBs, and spironolactone. Plan:Continue amlodipine 10 mg daily, carvedilol 12.5 mg BID; change HCTZ to chlorthalidone 25 mg daily. BMP today. Patient asked to start using home BP cuff and keep a log of her home readings to bring to her next OV.

## 2022-01-15 NOTE — Progress Notes (Signed)
Internal Medicine Clinic Attending ? ?Case discussed with Dr. Dean  At the time of the visit.  We reviewed the resident?s history and exam and pertinent patient test results.  I agree with the assessment, diagnosis, and plan of care documented in the resident?s note.  ?

## 2022-01-15 NOTE — Assessment & Plan Note (Signed)
HbA1c last checked was 08/2021 was 6.7%, today is 7.0%. She does not have a regular eye doctor that she sees. Current regimen is victoza 1.2 mg daily. Her weight is up about 9 pounds in 2 months. Plan: Referral placed for diabetic eye exam. Given gradual increase in HbA1c and weight gain, I will have her increase her victoza dose to 1.8 mg daily.

## 2022-01-15 NOTE — Assessment & Plan Note (Signed)
Renal function has been stable over last several lab draws with serum creatinine ranging 1.30-1.60 and GFR 37-42, consistent with CKD stage 3b. At this time her diabetes control is right at goal of HbA1c <7.0% but her blood pressure remains above goal. Plan:BMP today. Will continue to work towards optimization of her other chronic medical conditions prior to referral to nephrology.

## 2022-01-16 LAB — LIPID PANEL
Chol/HDL Ratio: 2.4 ratio (ref 0.0–4.4)
Cholesterol, Total: 160 mg/dL (ref 100–199)
HDL: 66 mg/dL (ref >39)
LDL Chol Calc (NIH): 83 mg/dL (ref 0–99)
Triglycerides: 55 mg/dL (ref 0–149)
VLDL Cholesterol Cal: 11 mg/dL (ref 5–40)

## 2022-01-16 LAB — BMP8+ANION GAP
Anion Gap: 17 mmol/L (ref 10.0–18.0)
BUN/Creatinine Ratio: 27 (ref 12–28)
BUN: 46 mg/dL — ABNORMAL HIGH (ref 8–27)
CO2: 24 mmol/L (ref 20–29)
Calcium: 8.9 mg/dL (ref 8.7–10.3)
Chloride: 103 mmol/L (ref 96–106)
Creatinine, Ser: 1.69 mg/dL — ABNORMAL HIGH (ref 0.57–1.00)
Glucose: 129 mg/dL — ABNORMAL HIGH (ref 70–99)
Potassium: 4.4 mmol/L (ref 3.5–5.2)
Sodium: 144 mmol/L (ref 134–144)
eGFR: 32 mL/min/{1.73_m2} — ABNORMAL LOW (ref >59)

## 2022-01-17 ENCOUNTER — Telehealth: Payer: Self-pay | Admitting: Internal Medicine

## 2022-01-17 MED ORDER — EZETIMIBE 10 MG PO TABS: 10.0000 mg | ORAL_TABLET | Freq: Every day | ORAL | 11 refills | Status: DC

## 2022-01-17 NOTE — Telephone Encounter (Signed)
Contacted patient to discuss lab results and plan to add zetia 10 mg daily to hyperlipidemia regimen. All questions were answered. Prescription sent in.  Farrel Gordon, DO

## 2022-01-18 ENCOUNTER — Telehealth: Payer: Self-pay | Admitting: *Deleted

## 2022-01-18 NOTE — Telephone Encounter (Signed)
Call from [place] pharmacist wanting to know if any medication was discontinued at [place]. Per OV 12/4, informed HCTZ was discontinued. Stated she will call/inform pt's daughter.

## 2022-01-18 NOTE — Telephone Encounter (Signed)
Thank you for helping clarify, Lauren!

## 2022-01-18 NOTE — Telephone Encounter (Signed)
Patient called in with questions about chlorthalidone. Explained she is to take this once daily at same time every day. This med is replacing HCTZ. She states her meds come in pill packs so she doesn't know which one is HCTZ. She is advised to contact [facility] so they can let her know which pill to remove from her pack.

## 2022-01-23 ENCOUNTER — Ambulatory Visit: Payer: Medicare Other | Admitting: Podiatry

## 2022-01-25 ENCOUNTER — Ambulatory Visit: Payer: Self-pay | Admitting: Licensed Clinical Social Worker

## 2022-01-25 NOTE — Patient Outreach (Signed)
SW removed self from [place], [place], MSW, [place]  Social Worker [facility]  [phone]

## 2022-02-06 ENCOUNTER — Other Ambulatory Visit: Payer: Self-pay | Admitting: Internal Medicine

## 2022-02-06 DIAGNOSIS — I152 Hypertension secondary to endocrine disorders: Secondary | ICD-10-CM

## 2022-02-26 ENCOUNTER — Other Ambulatory Visit: Payer: Self-pay | Admitting: Internal Medicine

## 2022-02-26 DIAGNOSIS — I152 Hypertension secondary to endocrine disorders: Secondary | ICD-10-CM

## 2022-02-28 ENCOUNTER — Emergency Department (HOSPITAL_COMMUNITY): Payer: 59

## 2022-02-28 ENCOUNTER — Inpatient Hospital Stay (HOSPITAL_COMMUNITY)
Admission: EM | Admit: 2022-02-28 | Discharge: 2022-03-07 | DRG: 917 | Disposition: A | Discharge status: Home / Self Care | Payer: 59 | Attending: Infectious Diseases | Admitting: Infectious Diseases

## 2022-02-28 ENCOUNTER — Encounter (HOSPITAL_COMMUNITY): Payer: Self-pay

## 2022-02-28 ENCOUNTER — Other Ambulatory Visit: Payer: Self-pay

## 2022-02-28 DIAGNOSIS — R4182 Altered mental status, unspecified: Secondary | ICD-10-CM

## 2022-02-28 DIAGNOSIS — E860 Dehydration: Secondary | ICD-10-CM | POA: Diagnosis present

## 2022-02-28 DIAGNOSIS — I129 Hypertensive chronic kidney disease with stage 1 through stage 4 chronic kidney disease, or unspecified chronic kidney disease: Secondary | ICD-10-CM | POA: Diagnosis present

## 2022-02-28 DIAGNOSIS — R410 Disorientation, unspecified: Secondary | ICD-10-CM | POA: Diagnosis not present

## 2022-02-28 DIAGNOSIS — E785 Hyperlipidemia, unspecified: Secondary | ICD-10-CM | POA: Diagnosis present

## 2022-02-28 DIAGNOSIS — G4700 Insomnia, unspecified: Secondary | ICD-10-CM | POA: Diagnosis present

## 2022-02-28 DIAGNOSIS — J45909 Unspecified asthma, uncomplicated: Secondary | ICD-10-CM | POA: Diagnosis present

## 2022-02-28 DIAGNOSIS — R197 Diarrhea, unspecified: Secondary | ICD-10-CM | POA: Diagnosis present

## 2022-02-28 DIAGNOSIS — N179 Acute kidney failure, unspecified: Secondary | ICD-10-CM | POA: Diagnosis present

## 2022-02-28 DIAGNOSIS — Z886 Allergy status to analgesic agent status: Secondary | ICD-10-CM

## 2022-02-28 DIAGNOSIS — G894 Chronic pain syndrome: Secondary | ICD-10-CM | POA: Diagnosis present

## 2022-02-28 DIAGNOSIS — T405X1A Poisoning by cocaine, accidental (unintentional), initial encounter: Principal | ICD-10-CM | POA: Diagnosis present

## 2022-02-28 DIAGNOSIS — E875 Hyperkalemia: Secondary | ICD-10-CM | POA: Diagnosis present

## 2022-02-28 DIAGNOSIS — F03918 Unspecified dementia, unspecified severity, with other behavioral disturbance: Secondary | ICD-10-CM | POA: Diagnosis present

## 2022-02-28 DIAGNOSIS — F05 Delirium due to known physiological condition: Secondary | ICD-10-CM | POA: Diagnosis not present

## 2022-02-28 DIAGNOSIS — F1410 Cocaine abuse, uncomplicated: Secondary | ICD-10-CM

## 2022-02-28 DIAGNOSIS — F14129 Cocaine abuse with intoxication, unspecified: Secondary | ICD-10-CM | POA: Diagnosis present

## 2022-02-28 DIAGNOSIS — Z880 Allergy status to penicillin: Secondary | ICD-10-CM

## 2022-02-28 DIAGNOSIS — Z888 Allergy status to other drugs, medicaments and biological substances status: Secondary | ICD-10-CM

## 2022-02-28 DIAGNOSIS — Z8673 Personal history of transient ischemic attack (TIA), and cerebral infarction without residual deficits: Secondary | ICD-10-CM

## 2022-02-28 DIAGNOSIS — I152 Hypertension secondary to endocrine disorders: Secondary | ICD-10-CM

## 2022-02-28 DIAGNOSIS — G928 Other toxic encephalopathy: Secondary | ICD-10-CM | POA: Diagnosis present

## 2022-02-28 DIAGNOSIS — F410 Panic disorder [episodic paroxysmal anxiety] without agoraphobia: Secondary | ICD-10-CM | POA: Diagnosis present

## 2022-02-28 DIAGNOSIS — Z1152 Encounter for screening for COVID-19: Secondary | ICD-10-CM

## 2022-02-28 DIAGNOSIS — Z7982 Long term (current) use of aspirin: Secondary | ICD-10-CM

## 2022-02-28 DIAGNOSIS — N1832 Chronic kidney disease, stage 3b: Secondary | ICD-10-CM | POA: Diagnosis present

## 2022-02-28 DIAGNOSIS — F0394 Unspecified dementia, unspecified severity, with anxiety: Secondary | ICD-10-CM | POA: Diagnosis present

## 2022-02-28 DIAGNOSIS — D649 Anemia, unspecified: Secondary | ICD-10-CM | POA: Diagnosis present

## 2022-02-28 DIAGNOSIS — Z79899 Other long term (current) drug therapy: Secondary | ICD-10-CM

## 2022-02-28 DIAGNOSIS — G9341 Metabolic encephalopathy: Secondary | ICD-10-CM | POA: Diagnosis present

## 2022-02-28 DIAGNOSIS — E1122 Type 2 diabetes mellitus with diabetic chronic kidney disease: Secondary | ICD-10-CM | POA: Diagnosis present

## 2022-02-28 LAB — URINALYSIS, ROUTINE W REFLEX MICROSCOPIC
Bacteria, UA: NONE SEEN
Bilirubin Urine: NEGATIVE
Glucose, UA: NEGATIVE mg/dL
Hgb urine dipstick: NEGATIVE
Ketones, ur: NEGATIVE mg/dL
Leukocytes,Ua: NEGATIVE
Nitrite: NEGATIVE
Protein, ur: >=300 mg/dL — AB
Specific Gravity, Urine: 1.012 (ref 1.005–1.030)
pH: 7 (ref 5.0–8.0)

## 2022-02-28 LAB — CBC WITH DIFFERENTIAL/PLATELET
Abs Immature Granulocytes: 0.02 10*3/uL (ref 0.00–0.07)
Basophils Absolute: 0 10*3/uL (ref 0.0–0.1)
Basophils Relative: 0 %
Eosinophils Absolute: 0.1 10*3/uL (ref 0.0–0.5)
Eosinophils Relative: 1 %
HCT: 40 % (ref 36.0–46.0)
Hemoglobin: 13 g/dL (ref 12.0–15.0)
Immature Granulocytes: 0 %
Lymphocytes Relative: 24 %
Lymphs Abs: 1.5 10*3/uL (ref 0.7–4.0)
MCH: 29.5 pg (ref 26.0–34.0)
MCHC: 32.5 g/dL (ref 30.0–36.0)
MCV: 90.9 fL (ref 80.0–100.0)
Monocytes Absolute: 0.4 10*3/uL (ref 0.1–1.0)
Monocytes Relative: 6 %
Neutro Abs: 4.2 10*3/uL (ref 1.7–7.7)
Neutrophils Relative %: 69 %
Platelets: 240 10*3/uL (ref 150–400)
RBC: 4.4 MIL/uL (ref 3.87–5.11)
RDW: 14.9 % (ref 11.5–15.5)
WBC: 6.1 10*3/uL (ref 4.0–10.5)
nRBC: 0 % (ref 0.0–0.2)

## 2022-02-28 LAB — LACTIC ACID, PLASMA: Lactic Acid, Venous: 1.2 mmol/L (ref 0.5–1.9)

## 2022-02-28 LAB — TSH: TSH: 2.861 u[IU]/mL (ref 0.350–4.500)

## 2022-02-28 LAB — COMPREHENSIVE METABOLIC PANEL
ALT: 31 U/L (ref 0–44)
AST: 24 U/L (ref 15–41)
Albumin: 3.2 g/dL — ABNORMAL LOW (ref 3.5–5.0)
Alkaline Phosphatase: 98 U/L (ref 38–126)
Anion gap: 7 (ref 5–15)
BUN: 40 mg/dL — ABNORMAL HIGH (ref 8–23)
CO2: 25 mmol/L (ref 22–32)
Calcium: 9.1 mg/dL (ref 8.9–10.3)
Chloride: 107 mmol/L (ref 98–111)
Creatinine, Ser: 1.82 mg/dL — ABNORMAL HIGH (ref 0.44–1.00)
GFR, Estimated: 29 mL/min — ABNORMAL LOW (ref >60)
Glucose, Bld: 180 mg/dL — ABNORMAL HIGH (ref 70–99)
Potassium: 6 mmol/L — ABNORMAL HIGH (ref 3.5–5.1)
Sodium: 139 mmol/L (ref 135–145)
Total Bilirubin: 0.4 mg/dL (ref 0.3–1.2)
Total Protein: 7.4 g/dL (ref 6.5–8.1)

## 2022-02-28 LAB — RESP PANEL BY RT-PCR (RSV, FLU A&B, COVID)  RVPGX2
Influenza A by PCR: NEGATIVE
Influenza B by PCR: NEGATIVE
Resp Syncytial Virus by PCR: NEGATIVE
SARS Coronavirus 2 by RT PCR: NEGATIVE

## 2022-02-28 LAB — RAPID URINE DRUG SCREEN, HOSP PERFORMED
Amphetamines: NOT DETECTED
Barbiturates: NOT DETECTED
Benzodiazepines: NOT DETECTED
Cocaine: POSITIVE — AB
Opiates: NOT DETECTED
Tetrahydrocannabinol: NOT DETECTED

## 2022-02-28 LAB — CBG MONITORING, ED: Glucose-Capillary: 133 mg/dL — ABNORMAL HIGH (ref 70–99)

## 2022-02-28 LAB — AMMONIA: Ammonia: 20 umol/L (ref 9–35)

## 2022-02-28 LAB — POTASSIUM: Potassium: 5.1 mmol/L (ref 3.5–5.1)

## 2022-02-28 MED ORDER — LACTATED RINGERS IV BOLUS
1000.0000 mL | Freq: Once | INTRAVENOUS | Status: AC
  Administered 2022-02-28: 1000 mL via INTRAVENOUS

## 2022-02-28 MED ORDER — LIRAGLUTIDE 18 MG/3ML ~~LOC~~ SOPN: 1.8000 mg | PEN_INJECTOR | Freq: Every day | SUBCUTANEOUS | Status: DC

## 2022-02-28 NOTE — ED Notes (Signed)
Patient refused covid swab at this time. Patient instructed that it was important to determine treatment and patient still declining.

## 2022-02-28 NOTE — ED Triage Notes (Signed)
Patient BIB [facility] from home for evaluation of altered mental status that started around 1800 yesterday when she started refusing her medications per family. Then this morning EMS states family reported patient started screaming this morning and was apparently hallucinating.   BP 168/94 HR 76 SpO2 100% CBG 174

## 2022-02-28 NOTE — ED Notes (Signed)
Patient daughter advised staff that patient does not walk at home. Daughter stated that patient wears a brief for incontinence. Patient is in hallway at this time and purewick is not obtainable at this time.

## 2022-02-28 NOTE — ED Notes (Signed)
Patient transported to MRI 

## 2022-02-28 NOTE — ED Notes (Signed)
Patient is back from MRI  

## 2022-02-28 NOTE — ED Provider Notes (Signed)
[facility] EMERGENCY DEPARTMENT Provider Note   CSN: [id number] Arrival date & time: 02/28/22  6644     History  Chief Complaint  Patient presents with   Altered Mental Status    Chelsea [name] is a 72 y.o. female.  The history is provided by the patient, a relative, the EMS personnel and medical records. No language interpreter was used.  Altered Mental Status Presenting symptoms: behavior changes and confusion   Presenting symptoms comment:  Delirium Severity:  Severe Most recent episode:  Yesterday Episode history:  Continuous Timing:  Constant Progression:  Waxing and waning Chronicity:  New Associated symptoms: hallucinations   Associated symptoms: no abdominal pain, no difficulty breathing, no fever, no headaches, no nausea, no rash, no vomiting and no weakness        Home Medications Prior to Admission medications   Medication Sig Start Date End Date Taking? Authorizing Provider  ACCU-CHEK GUIDE test strip Use to check blood sugar two times per day. 09/13/21   Farrel Gordon, DO  Accu-Chek Softclix Lancets lancets Use to check blood sugar 2 times per day. 11/06/19   Christian, Rylee, MD  albuterol (VENTOLIN HFA) 108 (90 Base) MCG/ACT inhaler INHALE 2 PUFFS into lungs EVERY 6 HOURS AS NEEDED FOR WHEEZING OR SHORTNESS OF BREATH Patient not taking: Reported on 11/03/2021 02/14/21   Mitzi Hansen, MD  amLODipine (NORVASC) 10 MG tablet Take 1 tablet (10 mg total) by mouth daily. 02/14/21   Mitzi Hansen, MD  ASPIRIN LOW DOSE 81 MG tablet TAKE 1 TABLET BY MOUTH EVERY DAY 12/18/21   Farrel Gordon, DO  atorvastatin (LIPITOR) 80 MG tablet Take 1 tablet (80 mg total) by mouth daily. 02/14/21   Mitzi Hansen, MD  Blood Glucose Monitoring Suppl (ACCU-CHEK GUIDE ME) w/Device KIT Use to check blood sugar one time per day. 07/28/19   Lucious Groves, DO  Blood Pressure Monitoring (BLOOD PRESSURE CUFF) MISC 1 Device by Does not apply route daily as needed. 10/04/21    Serita Butcher, MD  carvedilol (COREG) 6.25 MG tablet TAKE 2 TABLETS BY MOUTH TWICE DAILY WITH MEALS 01/15/22   Farrel Gordon, DO  chlorthalidone (HYGROTON) 25 MG tablet TAKE 1 TABLET BY MOUTH EVERY DAY 02/08/22   Riesa Pope, MD  ezetimibe (ZETIA) 10 MG tablet Take 1 tablet (10 mg total) by mouth daily. 01/17/22 01/17/23  Farrel Gordon, DO  fluticasone (FLONASE) 50 MCG/ACT nasal spray USE 2 SPRAYS IN [facility] NOSTRIL EVERY DAY 12/18/21   Farrel Gordon, DO  GNP ULTICARE PEN NEEDLES 32G X 4 MM MISC USE TO INJECT victoza ONCE DAILY 09/11/21   Farrel Gordon, DO  Lancets Misc. (ACCU-CHEK FASTCLIX LANCET) KIT Use to check blood sugar twice a day as discussed. 02/26/18   Bartholomew Crews, MD  liraglutide (VICTOZA) 18 MG/3ML SOPN Inject 1.8 mg into the skin daily. 01/15/22   Farrel Gordon, DO  Multiple Vitamin (MULTIVITAMIN WITH MINERALS) TABS tablet Take 1 tablet by mouth daily.    [provider]  Patiromer Sorbitex Calcium (VELTASSA) 25.2 g PACK Take 1 packet by mouth every other day. 05/04/21   Jose Persia, MD  sodium bicarbonate 650 MG tablet TAKE 1 TABLET BY MOUTH 3 TIMES DAILY 12/22/21   Farrel Gordon, DO  traZODone (DESYREL) 50 MG tablet TAKE HALF TO ONE TABLET BY MOUTH AT BEDTIME 09/25/21   Farrel Gordon, DO  Lancet Device MISC 1 each by Does not apply route 3 (three) times daily. 04/23/11 04/05/14  Charlann Lange, MD  Allergies    Ace inhibitors, Aldactone [spironolactone], Angiotensin receptor blockers, Atarax [hydroxyzine], Lisinopril, Nsaids, and Penicillins    Review of Systems   Review of Systems  Unable to perform ROS: Mental status change  Constitutional:  Negative for chills, fatigue and fever.  HENT:  Negative for congestion.   Respiratory:  Negative for cough, chest tightness, shortness of breath and wheezing.   Cardiovascular:  Negative for chest pain.  Gastrointestinal:  Negative for abdominal pain, constipation, diarrhea, nausea and vomiting.  Genitourinary:        Foul smelling  urine per EMS  Musculoskeletal:  Negative for back pain.  Skin:  Negative for rash.  Neurological:  Negative for weakness and headaches.  Psychiatric/Behavioral:  Positive for confusion and hallucinations.     Physical Exam Updated Vital Signs BP (!) 162/87 (BP Location: Right Arm)   Pulse 88   Temp 98.6 F (37 C) (Oral)   Resp 16   SpO2 100%  Physical Exam Vitals and nursing note reviewed.  Constitutional:      General: She is not in acute distress.    Appearance: She is well-developed. She is not ill-appearing, toxic-appearing or diaphoretic.  HENT:     Head: Normocephalic and atraumatic.     Nose: No congestion or rhinorrhea.     Mouth/Throat:     Mouth: Mucous membranes are moist.     Pharynx: No oropharyngeal exudate or posterior oropharyngeal erythema.  Eyes:     Extraocular Movements: Extraocular movements intact.     Conjunctiva/sclera: Conjunctivae normal.     Pupils: Pupils are equal, round, and reactive to light.  Cardiovascular:     Rate and Rhythm: Normal rate and regular rhythm.     Heart sounds: No murmur heard. Pulmonary:     Effort: Pulmonary effort is normal. No respiratory distress.     Breath sounds: Normal breath sounds.  Abdominal:     Palpations: Abdomen is soft.     Tenderness: There is no abdominal tenderness.  Musculoskeletal:        General: No swelling.     Cervical back: Neck supple.  Skin:    General: Skin is warm and dry.     Capillary Refill: Capillary refill takes less than 2 seconds.  Neurological:     Mental Status: She is alert. She is confused.     Cranial Nerves: No dysarthria or facial asymmetry.     Motor: No tremor, abnormal muscle tone or seizure activity.     Comments: Patient is disoriented and confused and not answering questions appropriately.  Patient had a symmetric smile and would intermittently say clear sentences and phrases.  Patient appears to be fine to external stimuli staring at the wall and looking scared around  people.  Patient appears to be in acute delirium  Psychiatric:        Mood and Affect: Mood normal.     ED Results / Procedures / Treatments   Labs (all labs ordered are listed, but only abnormal results are displayed) Labs Reviewed  COMPREHENSIVE METABOLIC PANEL - Abnormal; Notable for the following components:      Result Value   Potassium 6.0 (*)    Glucose, Bld 180 (*)    BUN 40 (*)    Creatinine, Ser 1.82 (*)    Albumin 3.2 (*)    GFR, Estimated 29 (*)    All other components within normal limits  URINE CULTURE  RESP PANEL BY RT-PCR (RSV, FLU A&B, COVID)  RVPGX2  CBC WITH DIFFERENTIAL/PLATELET  LACTIC ACID, PLASMA  TSH  AMMONIA  LACTIC ACID, PLASMA  URINALYSIS, ROUTINE W REFLEX MICROSCOPIC  RAPID URINE DRUG SCREEN, HOSP PERFORMED  ETHANOL    EKG None  Radiology DG Chest 1 View  Result Date: 02/28/2022 CLINICAL DATA:  Altered mental status. EXAM: CHEST  1 VIEW COMPARISON:  Chest radiographs 04/13/2011 FINDINGS: The cardiac silhouette is mildly enlarged. No airspace consolidation, edema, sizable pleural effusion, or pneumothorax is identified. No acute osseous abnormality is seen. IMPRESSION: No active disease. Electronically Signed   By: Logan Bores M.D.   On: 02/28/2022 10:48   CT HEAD WO CONTRAST (5MM)  Result Date: 02/28/2022 CLINICAL DATA:  Mental status change, history of stroke. EXAM: CT HEAD WITHOUT CONTRAST TECHNIQUE: Contiguous axial images were obtained from the base of the skull through the vertex without intravenous contrast. RADIATION DOSE REDUCTION: This exam was performed according to the departmental dose-optimization program which includes automated exposure control, adjustment of the mA and/or kV according to patient size and/or use of iterative reconstruction technique. COMPARISON:  October 06, 2017 CT examination FINDINGS: Brain: No evidence of acute infarction, hemorrhage, hydrocephalus, extra-axial collection or mass lesion/mass effect. Mild  generalized cerebral atrophy. Patchy area of low-attenuation of the periventricular and subcortical white matter presumed chronic microvascular ischemic changes. Vascular: No hyperdense vessel or unexpected calcification. Skull: Normal. Negative for fracture or focal lesion. Sinuses/Orbits: No acute finding. Other: None. IMPRESSION: 1. No acute intracranial abnormality. 2. Mild generalized cerebral atrophy and chronic microvascular ischemic changes of the white matter. Electronically Signed   By: Keane Police D.O.   On: 02/28/2022 10:40    Procedures Procedures    Medications Ordered in ED Medications - No data to display  ED Course/ Medical Decision Making/ A&P                             Medical Decision Making Amount and/or Complexity of Data Reviewed Labs: ordered. Radiology: ordered.    VALRIE JIA is a 72 y.o. female with a past medical history significant for hypertension, hyperlipidemia, diabetes, CKD, previous stroke, and anemia who presents for altered mental status.  According to EMS, patient was last normal around 6 PM last night and then has been acting abnormal since.  EMS was concerned about a foul-smelling urine that she may have a UTI but they were also concerned she was having acute delirium with evidence of responding to external stimuli and hallucinations.  Does not appear she has a history of hallucinations per the chart.  They report her glucose was less than 200 and her vital signs were stable and reassuring and route.  On my exam, patient does appear to be responding to external stimuli and appears to be hallucinating.  She will answer some questions with and will not answer others.  Her daughter arrived and was able to provide further history and they do not know of any recent fevers, chills, cough, chest pain, shortness of breath, or other physical complaints.  The daughter reports that the patient normally responds very vigorously to candy and when asked if she  wanted candy, patient did not answer and so the daughter feels that something is wrong.  On my further exam, patient was moving all extremities.  She had a symmetric smile for me.  Pupils are symmetric and reactive with normal extraocular movements.  Lungs were clear and chest was nontender.  Abdomen nontender.  Good pulses in extremities.  Clinical aspect patient is delirious and altered.  Due to her history of stroke, get head CT.  There is no reported trauma.  Will get screening labs including urinalysis given the concern for UTI by EMS.  Will get chest x-ray and EKG.  Due to her altered mental status and acute delirium anticipate admission once workup is completed.  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     Patient still acting altered compared to baseline.  Her workup continues to return.  Ammonia normal.  Lactic acid normal.  TSH normal.  CBC reassuring.  Metabolic panel does show evidence of hyperkalemia and more elevated creatinine than prior.  Patient still waiting on the results of urinalysis as there was concern for possible UTI.  Due to the altered mental status and delirium, I do anticipate she will need admission.  Care transferred to oncoming team to await results of urinalysis and reassessment.         Final Clinical Impression(s) / ED Diagnoses Final diagnoses:  Delirium  Altered mental status, unspecified altered mental status type     Clinical Impression: 1. Delirium   2. Altered mental status, unspecified altered mental status type     Disposition: Care transferred to oncoming team to await results of urinalysis and reassessment.  Anticipate admission for altered mental status and delirium that is new.  This note was prepared with assistance of Systems analyst. Occasional wrong-word or sound-a-like substitutions may have occurred due to the inherent limitations  of voice recognition software.      Lafawn Lenoir, Gwenyth Allegra, MD 02/28/22 1556

## 2022-02-28 NOTE — ED Provider Notes (Signed)
Assumed care from Dr. Gustavus Messing at 3 PM.  Patient here with altered mental status which started little bit yesterday but has gotten worse today.  Patient is delirious and seeing things.  Patient's daughter is present at bedside and reports her speech is still slurred and she is not herself.   Patient is awake but difficult to understand her speech.  Patient's UA today is negative for UTI, initial potassium was 6.0 but repeat it is 5.1 with some mild AKI with creatinine of 1.8, blood sugar has been stable, CBC within normal limits.  Patient's UDS is positive for cocaine today.  MRI was done to ensure no evidence of stroke given her more abrupt symptoms.  MRI is negative for acute stroke.  Findings were discussed with the patient's daughter.  Ask if she is in an environment where there may be cocaine around.  Her daughter does not live with her but she stays with another family member and they report that there is marijuana in the house but she was not aware of any cocaine or that her mom would have gotten into anything.  Currently she is still not back at her baseline.  At this time we will admit for delirium.  May be related to drugs.  He has had no recent medication changes and she does have trazodone for sleep at night but she takes no other mind altering medications.   Blanchie Dessert, MD 02/28/22 2350

## 2022-03-01 DIAGNOSIS — G9341 Metabolic encephalopathy: Secondary | ICD-10-CM | POA: Diagnosis present

## 2022-03-01 DIAGNOSIS — F1410 Cocaine abuse, uncomplicated: Secondary | ICD-10-CM

## 2022-03-01 DIAGNOSIS — N179 Acute kidney failure, unspecified: Secondary | ICD-10-CM | POA: Diagnosis not present

## 2022-03-01 LAB — COMPREHENSIVE METABOLIC PANEL
ALT: 24 U/L (ref 0–44)
AST: 21 U/L (ref 15–41)
Albumin: 3 g/dL — ABNORMAL LOW (ref 3.5–5.0)
Alkaline Phosphatase: 95 U/L (ref 38–126)
Anion gap: 7 (ref 5–15)
BUN: 36 mg/dL — ABNORMAL HIGH (ref 8–23)
CO2: 23 mmol/L (ref 22–32)
Calcium: 9.3 mg/dL (ref 8.9–10.3)
Chloride: 112 mmol/L — ABNORMAL HIGH (ref 98–111)
Creatinine, Ser: 1.62 mg/dL — ABNORMAL HIGH (ref 0.44–1.00)
GFR, Estimated: 33 mL/min — ABNORMAL LOW (ref >60)
Glucose, Bld: 147 mg/dL — ABNORMAL HIGH (ref 70–99)
Potassium: 5.3 mmol/L — ABNORMAL HIGH (ref 3.5–5.1)
Sodium: 142 mmol/L (ref 135–145)
Total Bilirubin: 0.6 mg/dL (ref 0.3–1.2)
Total Protein: 6.8 g/dL (ref 6.5–8.1)

## 2022-03-01 LAB — CBC
HCT: 35.5 % — ABNORMAL LOW (ref 36.0–46.0)
Hemoglobin: 11.3 g/dL — ABNORMAL LOW (ref 12.0–15.0)
MCH: 29.3 pg (ref 26.0–34.0)
MCHC: 31.8 g/dL (ref 30.0–36.0)
MCV: 92 fL (ref 80.0–100.0)
Platelets: 260 10*3/uL (ref 150–400)
RBC: 3.86 MIL/uL — ABNORMAL LOW (ref 3.87–5.11)
RDW: 15.1 % (ref 11.5–15.5)
WBC: 8.7 10*3/uL (ref 4.0–10.5)
nRBC: 0 % (ref 0.0–0.2)

## 2022-03-01 LAB — URINE CULTURE: Culture: NO GROWTH

## 2022-03-01 LAB — CBG MONITORING, ED
Glucose-Capillary: 132 mg/dL — ABNORMAL HIGH (ref 70–99)
Glucose-Capillary: 114 mg/dL — ABNORMAL HIGH (ref 70–99)
Glucose-Capillary: 142 mg/dL — ABNORMAL HIGH (ref 70–99)
Glucose-Capillary: 128 mg/dL — ABNORMAL HIGH (ref 70–99)

## 2022-03-01 MED ORDER — CHLORTHALIDONE 25 MG PO TABS: 25.0000 mg | ORAL_TABLET | Freq: Every day | ORAL | Status: DC

## 2022-03-01 MED ORDER — SODIUM BICARBONATE 650 MG PO TABS
650.0000 mg | ORAL_TABLET | Freq: Three times a day (TID) | ORAL | Status: DC
  Administered 2022-03-01 – 2022-03-07 (×19): 650 mg via ORAL
  Filled 2022-03-01 (×18): qty 1

## 2022-03-01 MED ORDER — CARVEDILOL 12.5 MG PO TABS
12.5000 mg | ORAL_TABLET | Freq: Two times a day (BID) | ORAL | Status: DC
  Administered 2022-03-01 – 2022-03-02 (×4): 12.5 mg via ORAL
  Filled 2022-03-01 (×4): qty 1

## 2022-03-01 MED ORDER — THIAMINE HCL 100 MG/ML IJ SOLN: 100.0000 mg | Freq: Every day | INTRAMUSCULAR | Status: DC

## 2022-03-01 MED ORDER — LIRAGLUTIDE 18 MG/3ML ~~LOC~~ SOPN: 1.8000 mg | PEN_INJECTOR | Freq: Every day | SUBCUTANEOUS | Status: DC

## 2022-03-01 MED ORDER — ASPIRIN 81 MG PO TBEC
81.0000 mg | DELAYED_RELEASE_TABLET | Freq: Every day | ORAL | Status: DC
  Administered 2022-03-01 – 2022-03-07 (×7): 81 mg via ORAL
  Filled 2022-03-01 (×7): qty 1

## 2022-03-01 MED ORDER — ENOXAPARIN SODIUM 30 MG/0.3ML IJ SOSY
30.0000 mg | PREFILLED_SYRINGE | INTRAMUSCULAR | Status: DC
  Administered 2022-03-01: 30 mg via SUBCUTANEOUS
  Filled 2022-03-01: qty 0.3

## 2022-03-01 MED ORDER — AMLODIPINE BESYLATE 10 MG PO TABS
10.0000 mg | ORAL_TABLET | Freq: Every day | ORAL | Status: DC
  Administered 2022-03-01 – 2022-03-07 (×7): 10 mg via ORAL
  Filled 2022-03-01: qty 2
  Filled 2022-03-01 (×5): qty 1

## 2022-03-01 MED ORDER — FOLIC ACID 1 MG PO TABS: 1.0000 mg | ORAL_TABLET | Freq: Every day | ORAL | Status: DC

## 2022-03-01 MED ORDER — ATORVASTATIN CALCIUM 80 MG PO TABS
80.0000 mg | ORAL_TABLET | Freq: Every day | ORAL | Status: DC
  Administered 2022-03-01 – 2022-03-07 (×7): 80 mg via ORAL
  Filled 2022-03-01 (×5): qty 1
  Filled 2022-03-01: qty 2
  Filled 2022-03-01: qty 1

## 2022-03-01 MED ORDER — INSULIN ASPART 100 UNIT/ML IJ SOLN
0.0000 [IU] | INTRAMUSCULAR | Status: DC
  Administered 2022-03-01 (×3): 2 [IU] via SUBCUTANEOUS

## 2022-03-01 MED ORDER — THIAMINE MONONITRATE 100 MG PO TABS: 100.0000 mg | ORAL_TABLET | Freq: Every day | ORAL | Status: DC

## 2022-03-01 MED ORDER — HALOPERIDOL LACTATE 5 MG/ML IJ SOLN
1.0000 mg | Freq: Once | INTRAMUSCULAR | Status: AC
  Administered 2022-03-01: 1 mg via INTRAMUSCULAR
  Filled 2022-03-01: qty 1

## 2022-03-01 MED ORDER — ENOXAPARIN SODIUM 60 MG/0.6ML IJ SOSY
0.5000 mg/kg | PREFILLED_SYRINGE | INTRAMUSCULAR | Status: DC
  Administered 2022-03-02 – 2022-03-03 (×2): 45 mg via SUBCUTANEOUS
  Filled 2022-03-01 (×2): qty 0.6

## 2022-03-01 MED ORDER — EZETIMIBE 10 MG PO TABS
10.0000 mg | ORAL_TABLET | Freq: Every day | ORAL | Status: DC
  Administered 2022-03-01 – 2022-03-07 (×7): 10 mg via ORAL
  Filled 2022-03-01 (×7): qty 1

## 2022-03-01 MED ORDER — LACTATED RINGERS IV SOLN: INTRAVENOUS | Status: AC

## 2022-03-01 MED ORDER — ADULT MULTIVITAMIN W/MINERALS CH
1.0000 | ORAL_TABLET | Freq: Every day | ORAL | Status: DC
  Administered 2022-03-01 – 2022-03-07 (×6): 1 via ORAL
  Filled 2022-03-01 (×6): qty 1
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
## 2022-03-01 NOTE — ED Notes (Signed)
Patient taken to medsurg floor at this time with transit. Patient coooperative at time of transfer and in NAD.

## 2022-03-01 NOTE — Evaluation (Signed)
Physical Therapy Evaluation Patient Details Name: Chelsea [name] MRN: [id number] DOB: Jul 06, 1950 Today's Date: 03/01/2022  History of Present Illness  Chelsea [name] is an 72 y.o. female, on 02/28/22 presented to ED and was found to be encephalopathic. Her UDS is positive for cocaine. with PMH of HTN, HLD, DM2, withdrawal seizures  Clinical Impression  Pt admitted with above diagnosis. Daughter present during evaluation and provides history. Reports pt mod I at baseline using 4WW to mobilize and denies any falls she is aware of. Currently Max assist +2 for sit<>stand transfer unable to tolerate standing without max support. May improve functionally during stay but anticipate SNF need prior to returning home. Will continue to monitor function and update recs as appropriate. Limited assist at home, lives with son, but daughter reports unsure of safety given current situation. Pt currently with functional limitations due to the deficits listed below (see PT Problem List). Pt will benefit from skilled PT to increase their independence and safety with mobility to allow discharge to the venue listed below.          Recommendations for follow up therapy are one component of a multi-disciplinary discharge planning process, led by the attending physician.  Recommendations may be updated based on patient status, additional functional criteria and insurance authorization.  Follow Up Recommendations Skilled nursing-short term rehab (<3 hours/day) Can patient physically be transported by private vehicle: No    Assistance Recommended at Discharge Frequent or constant Supervision/Assistance  Patient can return home with the following  Two people to help with walking and/or transfers;A lot of help with bathing/dressing/bathroom;Assistance with cooking/housework;Direct supervision/assist for medications management;Direct supervision/assist for financial management;Assist for transportation;Help with stairs or  ramp for entrance    Equipment Recommendations None recommended by PT  Recommendations for Other Services       Functional Status Assessment Patient has had a recent decline in their functional status and demonstrates the ability to make significant improvements in function in a reasonable and predictable amount of time.     Precautions / Restrictions Precautions Precautions: Fall Restrictions Weight Bearing Restrictions: No      Mobility  Bed Mobility Overal bed mobility: Needs Assistance Bed Mobility: Supine to Sit, Sit to Supine     Supine to sit: Mod assist, HOB elevated Sit to supine: Mod assist   General bed mobility comments: Mod assist for LEs and trunk to rise and lower to/from edge of bed. Cues throughout, does provide some assist with trunk and LEs but slow and difficulty faciliating and sequencing.    Transfers Overall transfer level: Needs assistance Equipment used: Rolling walker (2 wheels) Transfers: Sit to/from Stand Sit to Stand: Max assist, +2 safety/equipment, +2 physical assistance, From elevated surface           General transfer comment: Stood x2 with max assist +2. LEs extending excessively despite cues for sequencing, not responsive. Leans posteriorly and feet start to slip despite Rt knee block and UE support on RW. Needs assist to sit back down ea time.    Ambulation/Gait                  Stairs            Wheelchair Mobility    Modified Rankin (Stroke Patients Only)       Balance Overall balance assessment: Needs assistance Sitting-balance support: Feet supported, Bilateral upper extremity supported Sitting balance-Leahy Scale: Poor Sitting balance - Comments: Sits with min assist EOB Postural control: Posterior lean Standing balance  support: Bilateral upper extremity supported, During functional activity, Reliant on assistive device for balance Standing balance-Leahy Scale: Zero Standing balance comment: Posterior  LOB.                             Pertinent Vitals/Pain Pain Assessment Pain Assessment: PAINAD Breathing: normal Negative Vocalization: none Facial Expression: smiling or inexpressive Body Language: relaxed Consolability: no need to console PAINAD Score: 0 Pain Intervention(s): Monitored during session    Home Living Family/patient expects to be discharged to:: Skilled nursing facility Living Arrangements: Children Available Help at Discharge: Family;Available PRN/intermittently Type of Home: House Home Access: Stairs to enter Entrance Stairs-Rails: Psychiatric nurse of Steps: 4-5   Home Layout: One level Home Equipment: Rollator (4 wheels);Shower seat Additional Comments: information provied from daughter    Prior Function Prior Level of Function : Independent/Modified Independent             Mobility Comments: Daughter reports indpendent with Rollator, no falls.       Hand Dominance   Dominant Hand: Left    Extremity/Trunk Assessment   Upper Extremity Assessment Upper Extremity Assessment: Defer to OT evaluation    Lower Extremity Assessment Lower Extremity Assessment: Generalized weakness;Difficult to assess due to impaired cognition       Communication   Communication: Other (comment) (Not communicating well.)  Cognition Arousal/Alertness: Awake/alert Behavior During Therapy: Restless, Flat affect Overall Cognitive Status: Impaired/Different from baseline Area of Impairment: Orientation, Memory, Following commands, Safety/judgement, Problem solving                 Orientation Level: Disoriented to, Place, Time, Situation   Memory: Decreased recall of precautions, Decreased short-term memory Following Commands: Follows one step commands inconsistently, Follows one step commands with increased time Safety/Judgement: Decreased awareness of safety, Decreased awareness of deficits   Problem Solving: Slow processing,  Decreased initiation, Difficulty sequencing, Requires verbal cues, Requires tactile cues General Comments: Daughter reports some behavior problems at baseline but seems exacerbated today - agitated easily.        General Comments General comments (skin integrity, edema, etc.): labile affect, laughing, then angry, then flat. Daughter present and supportive. Questioning pt about drug use, whether intentional or accidental, mentions she will need to keep an eye on her living situation with patient's son.    Exercises     Assessment/Plan    PT Assessment Patient needs continued PT services  PT Problem List Decreased strength;Decreased activity tolerance;Decreased balance;Decreased mobility;Decreased coordination;Decreased cognition;Decreased knowledge of use of DME;Decreased safety awareness;Decreased knowledge of precautions       PT Treatment Interventions DME instruction;Gait training;Functional mobility training;Therapeutic activities;Therapeutic exercise;Balance training;Neuromuscular re-education;Patient/family education;Cognitive remediation    PT Goals (Current goals can be found in the Care Plan section)  Acute Rehab PT Goals Patient Stated Goal: none stated PT Goal Formulation: With patient/family Time For Goal Achievement: 03/15/22 Potential to Achieve Goals: Fair    Frequency Min 3X/week     Co-evaluation               AM-PAC PT "6 Clicks" Mobility  Outcome Measure Help needed turning from your back to your side while in a flat bed without using bedrails?: A Little Help needed moving from lying on your back to sitting on the side of a flat bed without using bedrails?: A Lot Help needed moving to and from a bed to a chair (including a wheelchair)?: Total Help needed standing up from a chair  using your arms (e.g., wheelchair or bedside chair)?: Total Help needed to walk in hospital room?: Total Help needed climbing 3-5 steps with a railing? : Total 6 Click Score:  9    End of Session Equipment Utilized During Treatment: Gait belt Activity Tolerance: Treatment limited secondary to agitation Patient left: in bed;with nursing/sitter in room;with family/visitor present Nurse Communication: Mobility status PT Visit Diagnosis: Unsteadiness on feet (R26.81);Other abnormalities of gait and mobility (R26.89);Muscle weakness (generalized) (M62.81);Difficulty in walking, not elsewhere classified (R26.2);Other symptoms and signs involving the nervous system (R29.898)    Time: 0934-1000 PT Time Calculation (min) (ACUTE ONLY): 26 min   Charges:   PT Evaluation $PT Eval Moderate Complexity: 1 Mod PT Treatments $Therapeutic Activity: 8-22 mins        Candie Mile, PT, DPT Physical Therapist Acute Rehabilitation Services [facility] 03/01/2022, 12:14 PM

## 2022-03-01 NOTE — ED Notes (Signed)
Messaged the doctor about getting medication for the patient. Patient is screaming and not being cooperative with glucose check.
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
## 2022-03-01 NOTE — Progress Notes (Signed)
HD#0 Subjective:   Summary: Chelsea [name] is a 72 y.o. female with a past medical history of HTN, T2DM, CVA, CKD 3b, and HLD who presented with altered mental status.  Patient admitted for acute encephalopathy secondary to cocaine intoxication.  On initial evaluation this morning patient was still a bit agitated and confused but overall less encephalopathic than admission.  Throughout the day she got calmer was able to remember that she had ingested the cocaine.  She and her daughter both think that she is not strong enough to go home safely and would need further therapy with acute rehab.  Objective:  Vital signs in last 24 hours: Vitals:   03/01/22 0813 03/01/22 0920 03/01/22 1211 03/01/22 1327  BP: (!) 167/78 125/85 (!) 144/68 (!) 169/51  Pulse: 87 75 83 81  Resp:  19  18  Temp:  98.4 F (36.9 C)  98.1 F (36.7 C)  TempSrc:  Oral  Oral  SpO2:  100%  100%  Weight:      Height:       Supplemental O2: Room Air SpO2: 100 %   Physical Exam:  Constitutional: Elderly female, in no acute distress HENT: normocephalic atraumatic, mucous membranes moist Eyes: conjunctiva non-erythematous, mild edema of the superior left eyelid Cardiovascular: regular rate and rhythm, 2+ bilateral radial and pedal pulses Pulmonary/Chest: normal work of breathing on room air, lungs clear to auscultation bilaterally Abdominal: soft, non-tender, non-distended MSK: normal bulk and tone Neurological: alert & oriented to person and place but not time, moves all extremities well against gravity, follows commands, cranial nerves II through XII grossly intact Skin: warm and dry  Filed Weights   02/28/22 2206  Weight: 92 kg     Intake/Output Summary (Last 24 hours) at 03/01/2022 1424 Last data filed at 03/01/2022 1202 Gross per 24 hour  Intake 2002.02 ml  Output --  Net 2002.02 ml   Net IO Since Admission: 2,002.02 mL [03/01/22 1424]  Pertinent Labs:    Latest Ref Rng & Units 03/01/2022     4:51 AM 02/28/2022   11:12 AM 07/31/2021   10:56 AM  CBC  WBC 4.0 - 10.5 K/uL 8.7  6.1  6.4   Hemoglobin 12.0 - 15.0 g/dL 11.3  13.0  12.0   Hematocrit 36.0 - 46.0 % 35.5  40.0  36.4   Platelets 150 - 400 K/uL 260  240  231.0        Latest Ref Rng & Units 03/01/2022    4:51 AM 02/28/2022    6:00 PM 02/28/2022   11:12 AM  CMP  Glucose 70 - 99 mg/dL 147   180   BUN 8 - 23 mg/dL 36   40   Creatinine 0.44 - 1.00 mg/dL 1.62   1.82   Sodium 135 - 145 mmol/L 142   139   Potassium 3.5 - 5.1 mmol/L 5.3  5.1  6.0   Chloride 98 - 111 mmol/L 112   107   CO2 22 - 32 mmol/L 23   25   Calcium 8.9 - 10.3 mg/dL 9.3   9.1   Total Protein 6.5 - 8.1 g/dL 6.8   7.4   Total Bilirubin 0.3 - 1.2 mg/dL 0.6   0.4   Alkaline Phos 38 - 126 U/L 95   98   AST 15 - 41 U/L 21   24   ALT 0 - 44 U/L 24   31     Imaging: MR BRAIN WO CONTRAST  Result Date: 02/28/2022 CLINICAL DATA:  Altered mental status EXAM: MRI HEAD WITHOUT CONTRAST TECHNIQUE: Multiplanar, multiecho pulse sequences of the brain and surrounding structures were obtained without intravenous contrast. COMPARISON:  MRI head 10/06/2017, correlation is also made with CT head 02/28/2022 FINDINGS: Evaluation is somewhat limited by motion artifact. Brain: No restricted diffusion to suggest acute or subacute infarct.No acute hemorrhage, mass, mass effect, or midline shift. No hydrocephalus or extra-axial collection.Small foci of hemosiderin deposition throughout the cerebral hemispheres, most prominently in the bilateral anterior temporal lobes (series 14, image 29), as well as in the deep gray structures.Advanced cerebral atrophy for age.Remote infarct in the right anterior temporal lobe. Remote lacunar infarcts in the bilateral basal ganglia, left greater than right corona radiata, and pons. T2 hyperintense signal in the periventricular white matter and pons, likely the sequela of moderate chronic small vessel ischemic disease. Vascular: Patent arterial flow  voids. Skull and upper cervical spine: Normal marrow signal. Sinuses/Orbits: Clear paranasal sinuses.No acute finding in the orbits. Other: The mastoid air cells are well aerated. IMPRESSION: 1. No acute intracranial process. No evidence of acute or subacute infarct. 2. Foci of hemosiderin deposition throughout the bilateral cerebral hemispheres, most prominent in the anterior temporal lobes, as well as in the deep gray structures. While the more central foci are commonly seeing in the setting of hypertensive microhemorrhages, the more peripheral foci could be seen in the setting of cerebral amyloid angiopathy. Electronically Signed   By: Merilyn Baba M.D.   On: 02/28/2022 23:37    Assessment/Plan:   Principal Problem:   Acute metabolic encephalopathy Active Problems:   Cocaine abuse (HCC)   AKI (acute kidney injury) ([place])   Patient Summary: Chelsea [name] is a 72 y.o. female with a past medical history of HTN, T2DM, CVA, CKD 3b, and HLD who presented with altered mental status.  Patient admitted for acute encephalopathy secondary to cocaine intoxication   #Acute Encephalopathy Patient presented in acute encephalopathy secondary to cocaine use after she was found altered at home x-ray.  She was giggling and seeing things.  Workup so far has not shown any CVA, infectious, or other metabolic cause.  She has gradually improved as she has been here and is nearly at baseline mentation standpoint.  However she is still very weak.  She was seen by PT who recommended SNF for acute rehab.  After talking to the patient and her daughter they both believe that this is the best route as she would not be safe going home.   -Supportive care -Hold centrally acting medications  -PT and OT following    #AKI on CKD3b Patient's baseline creatinine is around 1.3-1.5.  Patient's creatinine on admission was 1.82 and now down to 1.62 after IV fluids..  AKI likely pre-renal in the setting of dehydration and poor p.o.  intake.  -Continue home sodium bicarb 650 mg TID -Maintenance fluids -A.m. BMP   #Hyperkalemia, resolved  Patient does present with potassium of 6.0, but did come down to 5.1.  Of note, patient was on Veltassa at one point due to hyperkalemia, and was discontinued.  Potassium 5.3 today -Monitor BMP   #HTN Patient with past medical history of hypertension.  Patient was recently started on chlorthalidone 25 mg daily.  Patient is also on amlodipine 10 mg daily and coreg 12.5 mg BID.  Patient's most recent blood pressure 158/80. -Continue home amlodipine 10 mg daily  -Continue home coreg 12.5 mg BID -Hold home chlorthalidone 25 mg daily in setting  of AKI    #T2DM Patient has past medical history of type 2 diabetes mellitus.  Per family, patient recent blood sugars have been in the 300s, but most recent check yesterday it was 127. Blood glucose in the ED was 133. Patient tries to check blood sugar once a day. Home meds include Victoza 1.8 mg daily. -Hold home Victoza 1.8 mg daily -SSI -Monitor CBGs    #Hx of CVA #Hyperlipidemia Patient has a past medical history of CVA, leaving her with speech issues.  No acute worsening of her speech at this time.  No obvious focal neurological deficits appreciated.  Imaging was negative for any acute infarct. -Continue home aspirin 81 mg daily -Continue home atorvastatin 80 mg daily -Continue home ezetimibe 10 mg daily   #Insomnia Patient has a past medical history of insomnia, and takes trazodone nightly. -Hold trazodone  Diet:  DYS 3 IVF: None VTE: Enoxaparin Code: Full PT/OT recs: SNF for Subacute PT.  Dispo: Anticipated discharge to Skilled nursing facility in 2 days pending further improvement and work with PT/OT.   Johny Blamer, DO Internal Medicine Resident PGY-1 Pager: [phone]  Please contact the on call pager after 5 pm and on weekends at [phone].

## 2022-03-01 NOTE — ED Notes (Signed)
Attempted to take pt's CBG and pt was intensely screaming and threatening EMT stating I would regret it if I got any closer to her. Pt's CBG was not obtained due to pt refusal and safety of staff.

## 2022-03-01 NOTE — Hospital Course (Addendum)
#  Acute Encephalopathy Patient presented to [facility] ED due to altered mental status, with behavior that included the patient screaming, giggling, and believing that someone was in the room with her. She underwent psychiatric evaluation at the home after calling 911, and the decision was ultimately made to bring her in for further evaluation. On arrival she was hemodynamically stable; had negative respiratory virus panel; normal lactic acid, TSH, and ammonia levels; UA was not concerning for UTI; and CT head did not show acute intracranial processes. UDS was obtained and positive for cocaine. Her encephalopathy resolved prior to discharge and is attributed to cocaine use.   #AKI on CKD3b Serum creatinine elevated to 1.82 with apparent baseline of around 1.3-1.5. This was thought to be pre-renal in the setting of poor PO intake, specifically with patient having no food or drink during the daytime while she underwent evaluation in the ED. She received IVF overnight and home sodium bicarbonate 650 mg TID was continued, with resolution of AKI evidenced by serum creatinine 1.62.   #Hyperkalemia, resolved  Initial potassium of 6.0, which improved to 5.3 on morning labs. Her potassium remained normal prior to discharge.   #HTN Patient was hypertensive on arrival to [facility] ED. Her home amlodipine and carvedilol were continued and chlorthalidone was held in the setting of AKI. Amlodipine was restarted with persistence of hypertension, and when chlorthalidone was restarted she had new acute kidney injury. This persisted when chlorthalidone was changed to HCTZ. HCTZ was ultimately held with improvement of renal function. Her BP remained stable with single therapy.   #T2DM Managed with SSI.   #Hx of CVA #Hyperlipidemia Home aspirin, atorvastatin, and zetia were continued.   #Insomnia Home trazodone was held in the setting of altered mental status.

## 2022-03-01 NOTE — ED Notes (Signed)
Patient much more cooperative at this time after haldol. Allowed this RN to place oxygen monitor back on patient.

## 2022-03-01 NOTE — ED Notes (Signed)
Contacted daughter to update her on patient moving upstairs.

## 2022-03-02 DIAGNOSIS — N179 Acute kidney failure, unspecified: Secondary | ICD-10-CM | POA: Diagnosis not present

## 2022-03-02 DIAGNOSIS — F1410 Cocaine abuse, uncomplicated: Secondary | ICD-10-CM | POA: Diagnosis not present

## 2022-03-02 DIAGNOSIS — G9341 Metabolic encephalopathy: Secondary | ICD-10-CM | POA: Diagnosis not present

## 2022-03-02 LAB — CBC
HCT: 31.3 % — ABNORMAL LOW (ref 36.0–46.0)
Hemoglobin: 10.1 g/dL — ABNORMAL LOW (ref 12.0–15.0)
MCH: 29.3 pg (ref 26.0–34.0)
MCHC: 32.3 g/dL (ref 30.0–36.0)
MCV: 90.7 fL (ref 80.0–100.0)
Platelets: 228 10*3/uL (ref 150–400)
RBC: 3.45 MIL/uL — ABNORMAL LOW (ref 3.87–5.11)
RDW: 14.6 % (ref 11.5–15.5)
WBC: 8.6 10*3/uL (ref 4.0–10.5)
nRBC: 0 % (ref 0.0–0.2)

## 2022-03-02 LAB — GLUCOSE, CAPILLARY
Glucose-Capillary: 177 mg/dL — ABNORMAL HIGH (ref 70–99)
Glucose-Capillary: 270 mg/dL — ABNORMAL HIGH (ref 70–99)
Glucose-Capillary: 109 mg/dL — ABNORMAL HIGH (ref 70–99)

## 2022-03-02 LAB — BASIC METABOLIC PANEL
Anion gap: 9 (ref 5–15)
BUN: 38 mg/dL — ABNORMAL HIGH (ref 8–23)
CO2: 22 mmol/L (ref 22–32)
Calcium: 8.6 mg/dL — ABNORMAL LOW (ref 8.9–10.3)
Chloride: 109 mmol/L (ref 98–111)
Creatinine, Ser: 1.67 mg/dL — ABNORMAL HIGH (ref 0.44–1.00)
GFR, Estimated: 32 mL/min — ABNORMAL LOW (ref >60)
Glucose, Bld: 177 mg/dL — ABNORMAL HIGH (ref 70–99)
Potassium: 5.2 mmol/L — ABNORMAL HIGH (ref 3.5–5.1)
Sodium: 140 mmol/L (ref 135–145)

## 2022-03-02 MED ORDER — INSULIN ASPART 100 UNIT/ML IJ SOLN
0.0000 [IU] | Freq: Three times a day (TID) | INTRAMUSCULAR | Status: DC
  Administered 2022-03-02: 8 [IU] via SUBCUTANEOUS
  Administered 2022-03-03: 11 [IU] via SUBCUTANEOUS
  Administered 2022-03-03: 3 [IU] via SUBCUTANEOUS
  Administered 2022-03-04: 11 [IU] via SUBCUTANEOUS
  Administered 2022-03-04 (×2): 5 [IU] via SUBCUTANEOUS
  Administered 2022-03-05 (×3): 8 [IU] via SUBCUTANEOUS
  Administered 2022-03-06: 11 [IU] via SUBCUTANEOUS
  Administered 2022-03-06: 8 [IU] via SUBCUTANEOUS
  Administered 2022-03-06: 3 [IU] via SUBCUTANEOUS
  Administered 2022-03-07: 5 [IU] via SUBCUTANEOUS
  Administered 2022-03-07: 2 [IU] via SUBCUTANEOUS
  Administered 2022-03-07: 5 [IU] via SUBCUTANEOUS

## 2022-03-02 MED ORDER — THIAMINE MONONITRATE 100 MG PO TABS
100.0000 mg | ORAL_TABLET | Freq: Every day | ORAL | Status: DC
  Administered 2022-03-02 – 2022-03-07 (×6): 100 mg via ORAL
  Filled 2022-03-02 (×6): qty 1

## 2022-03-02 MED ORDER — CHLORTHALIDONE 25 MG PO TABS
25.0000 mg | ORAL_TABLET | Freq: Every day | ORAL | Status: DC
  Administered 2022-03-02: 25 mg via ORAL
  Filled 2022-03-02 (×2): qty 1

## 2022-03-02 MED ORDER — FOLIC ACID 1 MG PO TABS
1.0000 mg | ORAL_TABLET | Freq: Every day | ORAL | Status: DC
  Administered 2022-03-02 – 2022-03-07 (×6): 1 mg via ORAL
  Filled 2022-03-02 (×6): qty 1

## 2022-03-02 NOTE — Progress Notes (Signed)
CSW received SNF consult as family request. Will send out referrals, however, barrier to placement includes substance use.   Gilmore Laroche, MSW, [facility]

## 2022-03-02 NOTE — Progress Notes (Signed)
Pt is agitated,when went in to her room started screaming and RN was told to get out of her room.She  refused CBG check and PIV start.MD made aware.

## 2022-03-02 NOTE — Progress Notes (Signed)
Per Nurse, pt. Just woke up and declined PIV start. Will place another consult once pt. Is ok for PIV placement.

## 2022-03-02 NOTE — Progress Notes (Addendum)
HD#0 Subjective:   Summary: Chelsea [name] is a 72 y.o. female with a past medical history of HTN, T2DM, CVA, CKD 3b, and HLD who presented with altered mental status.  Patient admitted for acute encephalopathy secondary to cocaine intoxication.  Overnight events: None  Patient is doing much better this morning and per her and her daughter she is at her baseline.  She has had issues with sundowning for years and appeared to have some agitation overnight most likely due to this.  She denies any tremors or anxiety this morning.  She did not know how her strength is because she has not yet gotten up but will work with PT today and see if she is feeling well enough to go home safely.  Objective:  Vital signs in last 24 hours: Vitals:   03/01/22 2206 03/01/22 2356 03/02/22 0400 03/02/22 0802  BP: (!) 163/71 (!) 157/73 (!) 159/59 (!) 158/71  Pulse: 80 82 82 84  Resp: '16 16 11 19  '$ Temp: 98 F (36.7 C) 98.1 F (36.7 C)  99 F (37.2 C)  TempSrc: Oral Oral  Oral  SpO2: 99% 96% 95% 97%  Weight:      Height:       Supplemental O2: Room Air SpO2: 97 %   Physical Exam:  Constitutional: Elderly female, in no acute distress Eyes: conjunctiva non-erythematous, mild edema of the superior left eyelid (improved from yesterday) Cardiovascular: regular rate and rhythm Pulmonary/Chest: normal work of breathing on room air, lungs clear to auscultation bilaterally Abdominal: soft, non-tender, non-distended MSK: normal bulk and tone Neurological: alert & oriented to person but not place or time, moves all extremities well against gravity, follows commands Skin: warm and dry  Filed Weights   02/28/22 2206  Weight: 92 kg     Intake/Output Summary (Last 24 hours) at 03/02/2022 1051 Last data filed at 03/01/2022 1202 Gross per 24 hour  Intake 1002.02 ml  Output --  Net 1002.02 ml   Net IO Since Admission: 2,002.02 mL [03/02/22 1051]  Pertinent Labs:    Latest Ref Rng & Units 03/02/2022     8:21 AM 03/01/2022    4:51 AM 02/28/2022   11:12 AM  CBC  WBC 4.0 - 10.5 K/uL 8.6  8.7  6.1   Hemoglobin 12.0 - 15.0 g/dL 10.1  11.3  13.0   Hematocrit 36.0 - 46.0 % 31.3  35.5  40.0   Platelets 150 - 400 K/uL 228  260  240        Latest Ref Rng & Units 03/02/2022    8:21 AM 03/01/2022    4:51 AM 02/28/2022    6:00 PM  CMP  Glucose 70 - 99 mg/dL 177  147    BUN 8 - 23 mg/dL 38  36    Creatinine 0.44 - 1.00 mg/dL 1.67  1.62    Sodium 135 - 145 mmol/L 140  142    Potassium 3.5 - 5.1 mmol/L 5.2  5.3  5.1   Chloride 98 - 111 mmol/L 109  112    CO2 22 - 32 mmol/L 22  23    Calcium 8.9 - 10.3 mg/dL 8.6  9.3    Total Protein 6.5 - 8.1 g/dL  6.8    Total Bilirubin 0.3 - 1.2 mg/dL  0.6    Alkaline Phos 38 - 126 U/L  95    AST 15 - 41 U/L  21    ALT 0 - 44 U/L  24  Imaging: No results found.  Assessment/Plan:   Principal Problem:   Acute metabolic encephalopathy Active Problems:   Cocaine abuse (HCC)   AKI (acute kidney injury) ([place])   Patient Summary: Chelsea [name] is a 72 y.o. female with a past medical history of HTN, T2DM, CVA, CKD 3b, and HLD who presented with altered mental status.  Patient admitted for acute encephalopathy secondary to cocaine intoxication.   #Acute Encephalopathy #Progressive cognitive decline #Chronic alcohol use Patient presented in acute encephalopathy secondary to cocaine use after she was found altered at home.  She has had progressive cognitive decline at home noted by family.  This has been slowly worsening over several years.  Patient did have some sundowning last night and required 1 dose of IM Haldol for agitation.  Family notes that she has been having some of the symptoms at night for maybe 5 years.  She does continue to use alcohol roughly 12-24 ounces of beer a day.  CIWA scores last night up to 14 however this is likely more due to her hospital delirium on top of her progressive dementia.  She has shown steady improvement and  appears to be at baseline for mental status.  However she is still very weak.  She was seen by PT who recommended SNF for acute rehab.  Patient and daughter both felt that she was not strong enough to go home yesterday.  She has not yet gotten up today but will work with PT and if she is feeling well enough to safely go home we will plan to discharge her this afternoon. -Supportive care -Hold centrally acting medications, continue CIWA without Ativan -PT and OT following, appreciate their evaluations and recommendations  #AKI on CKD3b Patient's baseline creatinine is around 1.3-1.5.  Patient's creatinine on admission was 1.82 and now down to 1.67 after IV fluids..  AKI likely pre-renal in the setting of dehydration and poor p.o. intake.  -Continue home sodium bicarb 650 mg TID -Maintenance fluids -A.m. BMP  #Normocytic anemia Patient with a normocytic anemia here with hemoglobin above 12 two days ago.  11.3 down to 10.1 today.  No history of iron deficiency on available results.  Baseline hemoglobin does appear to be 10-12.  No known source of bleeding or signs of hemodynamic instability.  We will continue to monitor this. - A.m. CBC  #Hyperkalemia, resolved  Presented with potassium of 6.0, but did come down to 5.1.  Of note, patient was on Veltassa at one point due to hyperkalemia, and was discontinued.  Potassium 5.2 today.  Adding chlorthalidone as below -Monitor BMP   #HTN Patient with past medical history of hypertension.  Patient was recently started on chlorthalidone 25 mg daily.  Patient is also on amlodipine 10 mg daily and coreg 12.5 mg BID.  Blood pressures remain high despite amlodipine and Coreg. -Continue home amlodipine 10 mg daily  -Continue home coreg 12.5 mg BID -Resume chlorthalidone 25 mg daily    #T2DM Patient has past medical history of type 2 diabetes mellitus.  She has required 2 units of sliding scale insulin at each meal with blood sugars between 130 and 180  during admission. -Hold home Victoza 1.8 mg daily, resume on discharge -SSI, 3 times daily CBG   #Hx of CVA #Hyperlipidemia Patient has a past medical history of CVA, leaving her with speech issues.  No acute worsening of her speech at this time.  No obvious focal neurological deficits appreciated.  Imaging was negative for any acute infarct. -  Continue home aspirin 81 mg daily -Continue home atorvastatin 80 mg daily -Continue home ezetimibe 10 mg daily   #Insomnia Patient has a past medical history of insomnia, and takes trazodone nightly. -Hold trazodone  Diet:  DYS 3 IVF: None VTE: Enoxaparin Code: Full PT/OT recs: SNF for Subacute PT.  Dispo: Anticipated discharge to Skilled nursing facility or home pending further evaluation physical therapy and shared decision making with patient and family.  Johny Blamer, DO Internal Medicine Resident PGY-1 Pager: [phone]  Please contact the on call pager after 5 pm and on weekends at [phone].

## 2022-03-02 NOTE — Progress Notes (Signed)
Patient refused Lab and vitals.MD notified.

## 2022-03-02 NOTE — Evaluation (Signed)
Occupational Therapy Evaluation Patient Details Name: Chelsea [name] MRN: [id number] DOB: 1950-11-04 Today's Date: 03/02/2022   History of Present Illness Chelsea [name] is an 72 y.o. female, on 02/28/22 presented to ED and was found to be encephalopathic. Her UDS is positive for cocaine. with PMH of HTN, HLD, DM2, withdrawal seizures   Clinical Impression   Patient admitted for the diagnosis above.  PTA she lives with her son, who is not home during the day, walks with a 4WRW, and able to reheat meals and sponge bathe herself.  Currently she is Mod to Max A for all mobility and lower body ADL.  OT to follow in the acute setting to address the deficits listed below.  SNF is recommended for post acute rehab, as she does not have adequate assist to transition directly home.     Recommendations for follow up therapy are one component of a multi-disciplinary discharge planning process, led by the attending physician.  Recommendations may be updated based on patient status, additional functional criteria and insurance authorization.   Follow Up Recommendations  Skilled nursing-short term rehab (<3 hours/day)     Assistance Recommended at Discharge Frequent or constant Supervision/Assistance  Patient can return home with the following Help with stairs or ramp for entrance;Assist for transportation;Assistance with cooking/housework;A lot of help with walking and/or transfers;A lot of help with bathing/dressing/bathroom    Functional Status Assessment  Patient has had a recent decline in their functional status and demonstrates the ability to make significant improvements in function in a reasonable and predictable amount of time.  Equipment Recommendations  None recommended by OT    Recommendations for Other Services       Precautions / Restrictions Precautions Precautions: Fall Restrictions Weight Bearing Restrictions: No      Mobility Bed Mobility Overal bed mobility: Needs  Assistance Bed Mobility: Supine to Sit, Sit to Supine     Supine to sit: Mod assist, HOB elevated Sit to supine: Mod assist        Transfers Overall transfer level: Needs assistance Equipment used: Rolling walker (2 wheels) Transfers: Sit to/from Stand, Bed to chair/wheelchair/BSC Sit to Stand: Max assist     Step pivot transfers: Mod assist            Balance Overall balance assessment: Needs assistance Sitting-balance support: Feet supported Sitting balance-Leahy Scale: Good Sitting balance - Comments: able to remove and place L sock seated EOB   Standing balance support: Reliant on assistive device for balance, Bilateral upper extremity supported Standing balance-Leahy Scale: Poor                             ADL either performed or assessed with clinical judgement   ADL Overall ADL's : Needs assistance/impaired Eating/Feeding: Set up;Bed level   Grooming: Wash/dry hands;Wash/dry face;Set up;Sitting   Upper Body Bathing: Minimal assistance;Sitting   Lower Body Bathing: Maximal assistance;Sit to/from stand   Upper Body Dressing : Minimal assistance;Sitting   Lower Body Dressing: Maximal assistance;Sit to/from stand   Toilet Transfer: Moderate assistance;BSC/3in1;Stand-pivot   Toileting- Clothing Manipulation and Hygiene: Total assistance;Sit to/from stand               Vision Patient Visual Report: No change from baseline       Perception     Praxis      Pertinent Vitals/Pain Pain Assessment Pain Assessment: No/denies pain Pain Intervention(s): Monitored during session     Hand Dominance  Left   Extremity/Trunk Assessment Upper Extremity Assessment Upper Extremity Assessment: Generalized weakness   Lower Extremity Assessment Lower Extremity Assessment: Defer to PT evaluation   Cervical / Trunk Assessment Cervical / Trunk Assessment: Other exceptions Cervical / Trunk Exceptions: body habitus   Communication  Communication Communication: Expressive difficulties   Cognition Arousal/Alertness: Awake/alert Behavior During Therapy: Anxious Overall Cognitive Status: Within Functional Limits for tasks assessed                                 General Comments: Daughter reports baseline cognition at time of eval.     General Comments   VSS on RA    Exercises     Shoulder Instructions      Home Living Family/patient expects to be discharged to:: Skilled nursing facility Living Arrangements: Children Available Help at Discharge: Family;Available PRN/intermittently Type of Home: House Home Access: Stairs to enter [employer] of Steps: 4-5 Entrance Stairs-Rails: Right;Left Home Layout: One level     Bathroom Shower/Tub: Tub/shower unit         Home Equipment: Rollator (4 wheels);Shower seat   Additional Comments: information provied from daughter      Prior Functioning/Environment Prior Level of Function : Independent/Modified Independent             Mobility Comments: Daughter reports indpendent with Rollator, no falls. ADLs Comments: Able to sponge bathe at baseline, and heats meals in microwave        OT Problem List: Decreased strength;Decreased activity tolerance;Impaired balance (sitting and/or standing);Decreased safety awareness;Decreased knowledge of use of DME or AE      OT Treatment/Interventions: Self-care/ADL training;Therapeutic exercise;Therapeutic activities;Patient/family education;Balance training;DME and/or AE instruction    OT Goals(Current goals can be found in the care plan section) Acute Rehab OT Goals Patient Stated Goal: Go home OT Goal Formulation: With patient Time For Goal Achievement: 03/16/22 Potential to Achieve Goals: Good ADL Goals Pt Will Perform Grooming: with supervision;standing Pt Will Perform Lower Body Dressing: with min assist;sit to/from stand Pt Will Transfer to Toilet: with supervision;stand pivot  transfer;bedside commode Pt Will Perform Toileting - Clothing Manipulation and hygiene: with min guard assist;sitting/lateral leans;sit to/from stand Pt/caregiver will Perform Home Exercise Program: Increased strength;Both right and left upper extremity;With theraband;With Supervision  OT Frequency: Min 2X/week    Co-evaluation              AM-PAC OT "6 Clicks" Daily Activity     Outcome Measure Help from another person eating meals?: A Little Help from another person taking care of personal grooming?: A Little Help from another person toileting, which includes using toliet, bedpan, or urinal?: A Lot Help from another person bathing (including washing, rinsing, drying)?: A Lot Help from another person to put on and taking off regular upper body clothing?: A Little Help from another person to put on and taking off regular lower body clothing?: A Lot 6 Click Score: 15   End of Session Equipment Utilized During Treatment: Rolling walker (2 wheels) Nurse Communication: Mobility status  Activity Tolerance: Patient tolerated treatment well Patient left: in bed;with call bell/phone within reach;with family/visitor present  OT Visit Diagnosis: Unsteadiness on feet (R26.81);Muscle weakness (generalized) (M62.81)                Time: 7169-6789 OT Time Calculation (min): 47 min Charges:  OT General Charges $OT Visit: 1 Visit OT Evaluation $OT Eval Moderate Complexity: 1 Mod OT Treatments $Self  Care/Home Management : 23-37 mins  03/02/2022  RP, OTR/L  Acute Rehabilitation Services  Office:  [place] 03/02/2022, 12:55 PM

## 2022-03-02 NOTE — Progress Notes (Signed)
Reordered IV team consult for PIV because pt is full code on CIWA protocol with known daily alcohol intak, IV nurse states there is no immediate need for IV insertion at this time per her evaluation.

## 2022-03-02 NOTE — TOC Initial Note (Signed)
Transition of Care [facility]) - Initial/Assessment Note    Patient Details  Name: Chelsea [name] MRN: [id number] Date of Birth: 10-23-1950  [facility]) CM/SW Contact:    Benard Halsted, LCSW Phone Number: 03/02/2022, 3:20 PM  Clinical Narrative:                 CSW received SNF bed offer from [facility]. CSW attempted to reach family with no success.   CSW received return call from [facility] stating they must retract bed offer due to patent's substance use. No other SNF bed offers at this time.      Barriers to Discharge: Ship broker, Continued Medical Work up, Active Substance Use - Placement, SNF Pending bed offer   Patient Goals and CMS Choice Patient states their goals for this hospitalization and ongoing recovery are:: Rehab          Expected Discharge Plan and Services In-house Referral: Clinical Social Work     Living arrangements for the past 2 months: Single Family Home                                      Prior Living Arrangements/Services Living arrangements for the past 2 months: Single Family Home Lives with:: Adult Children Patient language and need for interpreter reviewed:: Yes Do you feel safe going back to the place where you live?: Yes      Need for Family Participation in Patient Care: Yes (Comment) Care giver support system in place?: Yes (comment)   Criminal Activity/Legal Involvement Pertinent to Current Situation/Hospitalization: No - Comment as needed  Activities of Daily Living Home Assistive Devices/Equipment: Other (Comment) (AMS,unable to answer the assessment question.) ADL Screening (condition at time of admission) Patient's cognitive ability adequate to safely complete daily activities?: No Is the patient deaf or have difficulty hearing?: No Does the patient have difficulty seeing, even when wearing glasses/contacts?: No Does the patient have difficulty concentrating, remembering, or making  decisions?: Yes Patient able to express need for assistance with ADLs?: Yes Does the patient have difficulty dressing or bathing?: Yes Independently performs ADLs?: No Communication: Independent Does the patient have difficulty walking or climbing stairs?: No Weakness of Legs: Both Weakness of Arms/Hands: Both  Permission Sought/Granted Permission sought to share information with : Facility Sport and exercise psychologist, Family Supports Permission granted to share information with : No  Share Information with NAME: Chelsea [name]  Permission granted to share info w AGENCY: SNFs  Permission granted to share info w Relationship: Daughter  Permission granted to share info w Contact Information: [phone]  Emotional Assessment Appearance:: Appears stated age Attitude/Demeanor/Rapport: Unable to Assess Affect (typically observed): Unable to Assess Orientation: : Oriented to Self Alcohol / Substance Use: Illicit Drugs Psych Involvement: No (comment)  Admission diagnosis:  Delirium [R41.0] Altered mental status, unspecified altered mental status type [A41.66] Acute metabolic encephalopathy [A63.01] Patient Active Problem List   Diagnosis Date Noted   Acute metabolic encephalopathy 60/11/9321   Cocaine abuse ([place]) 03/01/2022   AKI (acute kidney injury) ([place]) 03/01/2022   Anemia of chronic disease 10/23/2019   CKD (chronic kidney disease) stage 3, GFR 30-59 ml/min (HCC)    History of stroke 10/06/2017   Depression 08/28/2013   Primary localized osteoarthritis of knees, bilateral 11/08/2011   Hyperlipidemia associated with type 2 diabetes mellitus ([place]) 12/26/2005   Hypertension associated with diabetes ([place]) 12/26/2005   Type 2 diabetes mellitus  with diabetic retinopathy ([place]) 02/12/1993   PCP:  Farrel Gordon, DO Pharmacy:   [place], [place] - [address] [place] [address] Phone: [phone] Fax: [phone]     Social Determinants of Health  ([place]) Social History: [place]: Low Risk  (02/17/2021)  Depression (PHQ2-9): Low Risk  (01/15/2022)  Physical Activity: Inactive (02/17/2021)  Social Connections: Unknown (02/17/2021)  Tobacco Use: Low Risk  (02/28/2022)   SDOH Interventions:     Readmission Risk Interventions     No data to display

## 2022-03-02 NOTE — NC FL2 (Signed)
[place] LEVEL OF CARE FORM     IDENTIFICATION  Patient Name: Chelsea [name] Birthdate: 01/03/1951 Sex: female Admission Date (Current Location): 02/28/2022  [facility] and [place] Number:  Herbalist and Address:  [facility] [place]. [facility], [place] [address] [place], [place] [address]      Provider Number: [id number]  Attending Physician Name and Address:  Campbell Riches, MD  Relative Name and Phone Number:       Current Level of Care: Hospital Recommended Level of Care: [place] Prior Approval Number:    Date Approved/Denied:   PASRR Number: [id number]  Discharge Plan: SNF    Current Diagnoses: Patient Active Problem List   Diagnosis Date Noted   Acute metabolic encephalopathy 40/11/2723   Cocaine abuse ([place]) 03/01/2022   AKI (acute kidney injury) ([place]) 03/01/2022   Anemia of chronic disease 10/23/2019   CKD (chronic kidney disease) stage 3, GFR 30-59 ml/min (HCC)    History of stroke 10/06/2017   Depression 08/28/2013   Primary localized osteoarthritis of knees, bilateral 11/08/2011   Hyperlipidemia associated with type 2 diabetes mellitus ([place]) 12/26/2005   Hypertension associated with diabetes ([place]) 12/26/2005   Type 2 diabetes mellitus with diabetic retinopathy ([place]) 02/12/1993    Orientation RESPIRATION BLADDER Height & Weight     Self  Normal Incontinent, External catheter Weight: 202 lb 13.2 oz (92 kg) Height:  '5\' 4"'$  (162.6 cm)  BEHAVIORAL SYMPTOMS/MOOD NEUROLOGICAL BOWEL NUTRITION STATUS      Continent Diet (See dc summary)  AMBULATORY STATUS COMMUNICATION OF NEEDS Skin   Extensive Assist Verbally Normal                       Personal Care Assistance Level of Assistance  Bathing, Feeding, Dressing Bathing Assistance: Maximum assistance Feeding assistance: Limited assistance Dressing Assistance: Limited assistance     Functional Limitations Info             SPECIAL CARE FACTORS  FREQUENCY  PT (By licensed PT), OT (By licensed OT)     PT Frequency: 5x/week OT Frequency: 5x/week            Contractures Contractures Info: Not present    Additional Factors Info  Code Status, Allergies, Insulin Sliding Scale Code Status Info: Full Allergies Info: Ace Inhibitors, Aldactone (Spironolactone), Angiotensin Receptor Blockers, Atarax (Hydroxyzine), Nsaids, Penicillins, Zestril (Lisinopril   Insulin Sliding Scale Info: See dc summary       Current Medications (03/02/2022):  This is the current hospital active medication list Current Facility-Administered Medications  Medication Dose Route Frequency Provider Last Rate Last Admin   amLODipine (NORVASC) tablet 10 mg  10 mg Oral Daily Masters, Katie, DO   10 mg at 03/01/22 0908   aspirin EC tablet 81 mg  81 mg Oral Daily Masters, Katie, DO   81 mg at 03/01/22 0911   atorvastatin (LIPITOR) tablet 80 mg  80 mg Oral Daily Masters, Katie, DO   80 mg at 03/01/22 0908   carvedilol (COREG) tablet 12.5 mg  12.5 mg Oral BID WC Masters, Katie, DO   12.5 mg at 03/01/22 1729   enoxaparin (LOVENOX) injection 45 mg  0.5 mg/kg Subcutaneous Q24H Reome, Earle J, RPH       ezetimibe (ZETIA) tablet 10 mg  10 mg Oral Daily Masters, Katie, DO   10 mg at 36/64/40 3474   folic acid (FOLVITE) tablet 1 mg  1 mg Oral Daily Leigh Aurora,  DO       insulin aspart (novoLOG) injection 0-15 Units  0-15 Units Subcutaneous TID WC Johny Blamer, DO       multivitamin with minerals tablet 1 tablet  1 tablet Oral Daily Masters, Katie, DO   1 tablet at 03/01/22 9574   sodium bicarbonate tablet 650 mg  650 mg Oral TID Christiana Fuchs, DO   650 mg at 03/01/22 2249   thiamine (VITAMIN B1) tablet 100 mg  100 mg Oral Daily Leigh Aurora, DO         Discharge Medications: Please see discharge summary for a list of discharge medications.  Relevant Imaging Results:  Relevant Lab Results:   Additional Information SSn: [place],  LCSW

## 2022-03-03 DIAGNOSIS — J45909 Unspecified asthma, uncomplicated: Secondary | ICD-10-CM | POA: Diagnosis present

## 2022-03-03 DIAGNOSIS — N1832 Chronic kidney disease, stage 3b: Secondary | ICD-10-CM | POA: Diagnosis present

## 2022-03-03 DIAGNOSIS — I129 Hypertensive chronic kidney disease with stage 1 through stage 4 chronic kidney disease, or unspecified chronic kidney disease: Secondary | ICD-10-CM | POA: Diagnosis present

## 2022-03-03 DIAGNOSIS — G9341 Metabolic encephalopathy: Secondary | ICD-10-CM | POA: Diagnosis not present

## 2022-03-03 DIAGNOSIS — G894 Chronic pain syndrome: Secondary | ICD-10-CM | POA: Diagnosis present

## 2022-03-03 DIAGNOSIS — E785 Hyperlipidemia, unspecified: Secondary | ICD-10-CM | POA: Diagnosis present

## 2022-03-03 DIAGNOSIS — F0394 Unspecified dementia, unspecified severity, with anxiety: Secondary | ICD-10-CM | POA: Diagnosis present

## 2022-03-03 DIAGNOSIS — T405X1A Poisoning by cocaine, accidental (unintentional), initial encounter: Secondary | ICD-10-CM | POA: Diagnosis present

## 2022-03-03 DIAGNOSIS — F1090 Alcohol use, unspecified, uncomplicated: Secondary | ICD-10-CM | POA: Diagnosis not present

## 2022-03-03 DIAGNOSIS — Z7982 Long term (current) use of aspirin: Secondary | ICD-10-CM | POA: Diagnosis not present

## 2022-03-03 DIAGNOSIS — D649 Anemia, unspecified: Secondary | ICD-10-CM | POA: Diagnosis present

## 2022-03-03 DIAGNOSIS — Z8673 Personal history of transient ischemic attack (TIA), and cerebral infarction without residual deficits: Secondary | ICD-10-CM | POA: Diagnosis not present

## 2022-03-03 DIAGNOSIS — Z886 Allergy status to analgesic agent status: Secondary | ICD-10-CM | POA: Diagnosis not present

## 2022-03-03 DIAGNOSIS — Z880 Allergy status to penicillin: Secondary | ICD-10-CM | POA: Diagnosis not present

## 2022-03-03 DIAGNOSIS — E875 Hyperkalemia: Secondary | ICD-10-CM | POA: Diagnosis present

## 2022-03-03 DIAGNOSIS — F14129 Cocaine abuse with intoxication, unspecified: Secondary | ICD-10-CM | POA: Diagnosis present

## 2022-03-03 DIAGNOSIS — Z79899 Other long term (current) drug therapy: Secondary | ICD-10-CM | POA: Diagnosis not present

## 2022-03-03 DIAGNOSIS — R410 Disorientation, unspecified: Secondary | ICD-10-CM | POA: Diagnosis present

## 2022-03-03 DIAGNOSIS — G928 Other toxic encephalopathy: Secondary | ICD-10-CM | POA: Diagnosis present

## 2022-03-03 DIAGNOSIS — N179 Acute kidney failure, unspecified: Secondary | ICD-10-CM | POA: Diagnosis present

## 2022-03-03 DIAGNOSIS — F03918 Unspecified dementia, unspecified severity, with other behavioral disturbance: Secondary | ICD-10-CM | POA: Diagnosis present

## 2022-03-03 DIAGNOSIS — Z1152 Encounter for screening for COVID-19: Secondary | ICD-10-CM | POA: Diagnosis not present

## 2022-03-03 DIAGNOSIS — E860 Dehydration: Secondary | ICD-10-CM | POA: Diagnosis present

## 2022-03-03 DIAGNOSIS — E1122 Type 2 diabetes mellitus with diabetic chronic kidney disease: Secondary | ICD-10-CM | POA: Diagnosis present

## 2022-03-03 DIAGNOSIS — F05 Delirium due to known physiological condition: Secondary | ICD-10-CM | POA: Diagnosis not present

## 2022-03-03 DIAGNOSIS — Z888 Allergy status to other drugs, medicaments and biological substances status: Secondary | ICD-10-CM | POA: Diagnosis not present

## 2022-03-03 DIAGNOSIS — F410 Panic disorder [episodic paroxysmal anxiety] without agoraphobia: Secondary | ICD-10-CM | POA: Diagnosis present

## 2022-03-03 LAB — CBC
HCT: 28.8 % — ABNORMAL LOW (ref 36.0–46.0)
Hemoglobin: 9.5 g/dL — ABNORMAL LOW (ref 12.0–15.0)
MCH: 29.5 pg (ref 26.0–34.0)
MCHC: 33 g/dL (ref 30.0–36.0)
MCV: 89.4 fL (ref 80.0–100.0)
Platelets: 222 10*3/uL (ref 150–400)
RBC: 3.22 MIL/uL — ABNORMAL LOW (ref 3.87–5.11)
RDW: 14.8 % (ref 11.5–15.5)
WBC: 9.6 10*3/uL (ref 4.0–10.5)
nRBC: 0 % (ref 0.0–0.2)

## 2022-03-03 LAB — BASIC METABOLIC PANEL
Anion gap: 10 (ref 5–15)
BUN: 53 mg/dL — ABNORMAL HIGH (ref 8–23)
CO2: 23 mmol/L (ref 22–32)
Calcium: 8.2 mg/dL — ABNORMAL LOW (ref 8.9–10.3)
Chloride: 106 mmol/L (ref 98–111)
Creatinine, Ser: 2.3 mg/dL — ABNORMAL HIGH (ref 0.44–1.00)
GFR, Estimated: 22 mL/min — ABNORMAL LOW (ref >60)
Glucose, Bld: 142 mg/dL — ABNORMAL HIGH (ref 70–99)
Potassium: 5.2 mmol/L — ABNORMAL HIGH (ref 3.5–5.1)
Sodium: 139 mmol/L (ref 135–145)

## 2022-03-03 LAB — GLUCOSE, CAPILLARY
Glucose-Capillary: 144 mg/dL — ABNORMAL HIGH (ref 70–99)
Glucose-Capillary: 382 mg/dL — ABNORMAL HIGH (ref 70–99)
Glucose-Capillary: 175 mg/dL — ABNORMAL HIGH (ref 70–99)

## 2022-03-03 MED ORDER — ENOXAPARIN SODIUM 30 MG/0.3ML IJ SOSY
30.0000 mg | PREFILLED_SYRINGE | INTRAMUSCULAR | Status: DC
  Administered 2022-03-04: 30 mg via SUBCUTANEOUS
  Filled 2022-03-03: qty 0.3

## 2022-03-03 MED ORDER — CARVEDILOL 25 MG PO TABS
25.0000 mg | ORAL_TABLET | Freq: Two times a day (BID) | ORAL | Status: DC
  Administered 2022-03-03 – 2022-03-07 (×10): 25 mg via ORAL
  Filled 2022-03-03 (×10): qty 1

## 2022-03-03 MED ORDER — POLYETHYLENE GLYCOL 3350 17 G PO PACK: 17.0000 g | PACK | Freq: Every day | ORAL | Status: DC

## 2022-03-03 MED ORDER — SENNA 8.6 MG PO TABS: 1.0000 | ORAL_TABLET | Freq: Every day | ORAL | Status: DC

## 2022-03-03 MED ORDER — SODIUM CHLORIDE 0.9 % IV SOLN: INTRAVENOUS | Status: AC

## 2022-03-03 NOTE — Progress Notes (Signed)
HD#0 Subjective:   Summary: Chelsea [name] is a 72 y.o. female with a past medical history of HTN, T2DM, CVA, CKD 3b, and HLD who presented with altered mental status.  Patient admitted for acute toxic encephalopathy.  Overnight events: None  Patient is doing well this morning without any new or worsening complaints.  Of note she does feel the urge to have a bowel movement.  Objective:  Vital signs in last 24 hours: Vitals:   03/02/22 1600 03/02/22 1900 03/02/22 2300 03/03/22 0407  BP: (!) 123/53 (!) 131/49 (!) 154/53 (!) 146/59  Pulse:  80 80 77  Resp: 18 19    Temp:  98.8 F (37.1 C) 98.1 F (36.7 C) 98.7 F (37.1 C)  TempSrc:  Oral Oral Oral  SpO2:  96% 98% 96%  Weight:      Height:       Supplemental O2: Room Air SpO2: 96 %   Physical Exam:  Constitutional: Elderly female, in no acute distress Cardiovascular: regular rate and rhythm Pulmonary/Chest: normal work of breathing on room air, lungs clear to auscultation bilaterally Abdominal: soft, non-tender, non-distended MSK: normal bulk and tone Neurological: alert & oriented to person but not place or time, moves all extremities well against gravity Skin: warm and dry  Filed Weights   02/28/22 2206  Weight: 92 kg     Intake/Output Summary (Last 24 hours) at 03/03/2022 0715 Last data filed at 03/02/2022 2300 Gross per 24 hour  Intake --  Output 700 ml  Net -700 ml   Net IO Since Admission: 1,302.02 mL [03/03/22 0715]  Pertinent Labs:    Latest Ref Rng & Units 03/03/2022    4:17 AM 03/02/2022    8:21 AM 03/01/2022    4:51 AM  CBC  WBC 4.0 - 10.5 K/uL 9.6  8.6  8.7   Hemoglobin 12.0 - 15.0 g/dL 9.5  10.1  11.3   Hematocrit 36.0 - 46.0 % 28.8  31.3  35.5   Platelets 150 - 400 K/uL 222  228  260        Latest Ref Rng & Units 03/03/2022    4:17 AM 03/02/2022    8:21 AM 03/01/2022    4:51 AM  CMP  Glucose 70 - 99 mg/dL 142  177  147   BUN 8 - 23 mg/dL 53  38  36   Creatinine 0.44 - 1.00 mg/dL 2.30   1.67  1.62   Sodium 135 - 145 mmol/L 139  140  142   Potassium 3.5 - 5.1 mmol/L 5.2  5.2  5.3   Chloride 98 - 111 mmol/L 106  109  112   CO2 22 - 32 mmol/L '23  22  23   '$ Calcium 8.9 - 10.3 mg/dL 8.2  8.6  9.3   Total Protein 6.5 - 8.1 g/dL   6.8   Total Bilirubin 0.3 - 1.2 mg/dL   0.6   Alkaline Phos 38 - 126 U/L   95   AST 15 - 41 U/L   21   ALT 0 - 44 U/L   24     Imaging: No results found.  Assessment/Plan:   Principal Problem:   Acute metabolic encephalopathy Active Problems:   Cocaine abuse (HCC)   AKI (acute kidney injury) ([place])   Patient Summary: Chelsea [name] is a 72 y.o. female with a past medical history of HTN, T2DM, CVA, CKD 3b, and HLD who presented with altered mental status.  Patient  admitted for acute toxic encephalopathy.   #Acute Encephalopathy #Progressive cognitive decline #Chronic alcohol use Patient presented acute encephalopath\ic secondary to cocaine use after she was found altered at home.  She has had progressive cognitive decline at home noted by family.  This has been slowly worsening over several years with some sundowning for the past 5 years.  Patient did have some sundowning on night 1 and required 1 dose of IM Haldol for agitation.  She does continue to use alcohol roughly 12-24 ounces of beer a day but no noted drug use.  Family believes this was a one-time event.  CIWA scores were elevated in the setting of sundowning but have otherwise been unremarkable.  She has shown steady improvement and appears to be at baseline for mental status.  However she is still very weak.  She was seen by PT who recommended SNF for acute rehab.  -Supportive care -Hold centrally acting medications -PT and OT following, appreciate their evaluations and recommendations  #AKI on CKD3b Patient's baseline creatinine is around 1.3-1.5.  Patient's creatinine on admission was 1.82 and down to 1.67 after IV fluids.  We had restarted chlorthalidone for her hypertension  yesterday and her creatinine bumped up to 2.3.  No other electrolyte abnormalities.  -Continue home sodium bicarb 650 mg TID -Normal saline 100 mL an hour -A.m. BMP  #Normocytic anemia Patient with a normocytic anemia here with hemoglobin slowly trending down to 9.5 today from 12 on admission.  Likely hemodilution overall.  No history of iron deficiency on available results.  Baseline hemoglobin does appear to be 10-12.  No known source of bleeding or signs of hemodynamic instability.  We will continue to monitor this. - A.m. CBC  #Hyperkalemia, resolved  Presented with potassium of 6.0, but did come down to 5.1.  Of note, patient was on Veltassa at one point due to hyperkalemia, and was discontinued.  Potassium 5.2 today.  Stopping chlorthalidone secondary to AKI -Monitor BMP   #HTN Patient with past medical history of hypertension.  Patient was recently started on chlorthalidone 25 mg daily outpatient.  Patient is also on amlodipine 10 mg daily and coreg 12.5 mg BID.  Blood pressures remained high despite amlodipine and Coreg at home doses.  Chlorthalidone was added and patient subsequently had right significant bump in creatinine from 1.6-2.3.  Chlorthalidone was discontinued and carvedilol was increased as below. -Continue home amlodipine 10 mg daily  -Increase home coreg 25 mg BID -stop chlorthalidone 25 mg daily    #T2DM Patient has past medical history of type 2 diabetes mellitus.   -Hold home Victoza 1.8 mg daily, resume on discharge -SSI, 3 times daily CBG   #Hx of CVA #Hyperlipidemia Patient has a past medical history of CVA, leaving her with speech issues.  No acute worsening of her speech at this time.  No obvious focal neurological deficits appreciated.  Imaging was negative for any acute infarct. -Continue home aspirin 81 mg daily -Continue home atorvastatin 80 mg daily -Continue home ezetimibe 10 mg daily   #Insomnia Patient has a past medical history of insomnia, and  takes trazodone nightly. -Hold trazodone  Diet:  DYS 3 IVF: None VTE: Enoxaparin Code: Full PT/OT recs: SNF for Subacute PT.  Dispo: Anticipated discharge to Skilled nursing facility or home pending further evaluation physical therapy and shared decision making with patient and family.  Johny Blamer, DO Internal Medicine Resident PGY-1 Pager: [phone]  Please contact the on call pager after 5 pm and on weekends  at [phone].

## 2022-03-04 DIAGNOSIS — N179 Acute kidney failure, unspecified: Secondary | ICD-10-CM | POA: Diagnosis not present

## 2022-03-04 DIAGNOSIS — G9341 Metabolic encephalopathy: Secondary | ICD-10-CM | POA: Diagnosis not present

## 2022-03-04 DIAGNOSIS — N1832 Chronic kidney disease, stage 3b: Secondary | ICD-10-CM

## 2022-03-04 DIAGNOSIS — I129 Hypertensive chronic kidney disease with stage 1 through stage 4 chronic kidney disease, or unspecified chronic kidney disease: Secondary | ICD-10-CM | POA: Diagnosis not present

## 2022-03-04 DIAGNOSIS — F1090 Alcohol use, unspecified, uncomplicated: Secondary | ICD-10-CM | POA: Diagnosis not present

## 2022-03-04 LAB — GLUCOSE, CAPILLARY
Glucose-Capillary: 219 mg/dL — ABNORMAL HIGH (ref 70–99)
Glucose-Capillary: 249 mg/dL — ABNORMAL HIGH (ref 70–99)
Glucose-Capillary: 337 mg/dL — ABNORMAL HIGH (ref 70–99)

## 2022-03-04 LAB — BASIC METABOLIC PANEL
Anion gap: 9 (ref 5–15)
BUN: 52 mg/dL — ABNORMAL HIGH (ref 8–23)
CO2: 22 mmol/L (ref 22–32)
Calcium: 8.3 mg/dL — ABNORMAL LOW (ref 8.9–10.3)
Chloride: 105 mmol/L (ref 98–111)
Creatinine, Ser: 1.85 mg/dL — ABNORMAL HIGH (ref 0.44–1.00)
GFR, Estimated: 28 mL/min — ABNORMAL LOW (ref >60)
Glucose, Bld: 186 mg/dL — ABNORMAL HIGH (ref 70–99)
Potassium: 4.6 mmol/L (ref 3.5–5.1)
Sodium: 136 mmol/L (ref 135–145)

## 2022-03-04 LAB — CBC
HCT: 28.8 % — ABNORMAL LOW (ref 36.0–46.0)
Hemoglobin: 9.2 g/dL — ABNORMAL LOW (ref 12.0–15.0)
MCH: 28.9 pg (ref 26.0–34.0)
MCHC: 31.9 g/dL (ref 30.0–36.0)
MCV: 90.6 fL (ref 80.0–100.0)
Platelets: 200 10*3/uL (ref 150–400)
RBC: 3.18 MIL/uL — ABNORMAL LOW (ref 3.87–5.11)
RDW: 14.4 % (ref 11.5–15.5)
WBC: 8 10*3/uL (ref 4.0–10.5)
nRBC: 0 % (ref 0.0–0.2)

## 2022-03-04 MED ORDER — LORAZEPAM 1 MG PO TABS: 1.0000 mg | ORAL_TABLET | ORAL | Status: DC | PRN

## 2022-03-04 MED ORDER — LORAZEPAM 2 MG/ML IJ SOLN: 1.0000 mg | INTRAMUSCULAR | Status: DC | PRN

## 2022-03-04 MED ORDER — HYDROCHLOROTHIAZIDE 12.5 MG PO TABS
12.5000 mg | ORAL_TABLET | Freq: Every day | ORAL | Status: DC
  Administered 2022-03-04 – 2022-03-05 (×2): 12.5 mg via ORAL
  Filled 2022-03-04 (×2): qty 1

## 2022-03-04 NOTE — Plan of Care (Signed)

## 2022-03-04 NOTE — Progress Notes (Signed)
HD#1 Subjective:   Summary: Chelsea [name] is a 72 y.o. female with a past medical history of HTN, T2DM, CVA, CKD 3b, and HLD who presented with altered mental status.  Patient admitted for acute toxic encephalopathy.  Overnight events: None  Patient is doing well this morning. She had some loose stools yesterday but denies any bowel movements overnight. No abdominal pain.  Objective:  Vital signs in last 24 hours: Vitals:   03/03/22 1600 03/03/22 1955 03/03/22 2312 03/04/22 0302  BP: (!) 137/55 (!) 125/97 (!) 158/56 (!) 162/53  Pulse: 70 71 70 68  Resp: '16 20 20 17  '$ Temp: 98.1 F (36.7 C) 98.2 F (36.8 C) 98.2 F (36.8 C) 98.2 F (36.8 C)  TempSrc: Oral Oral Oral Oral  SpO2: 96%     Weight:      Height:       Supplemental O2: Room Air SpO2: 96 %   Physical Exam:  Constitutional: Elderly female, in no acute distress Cardiovascular: regular rate and rhythm Pulmonary/Chest: normal work of breathing on room air, lungs clear to auscultation bilaterally Abdominal: soft, non-tender, non-distended MSK: normal bulk and tone Neurological: alert & oriented, moves all extremities well against gravity Skin: warm and dry  Filed Weights   02/28/22 2206  Weight: 92 kg    No intake or output data in the 24 hours ending 03/04/22 0657  Net IO Since Admission: 1,302.02 mL [03/04/22 0657]  Pertinent Labs:    Latest Ref Rng & Units 03/04/2022    2:48 AM 03/03/2022    4:17 AM 03/02/2022    8:21 AM  CBC  WBC 4.0 - 10.5 K/uL 8.0  9.6  8.6   Hemoglobin 12.0 - 15.0 g/dL 9.2  9.5  10.1   Hematocrit 36.0 - 46.0 % 28.8  28.8  31.3   Platelets 150 - 400 K/uL 200  222  228        Latest Ref Rng & Units 03/04/2022    2:48 AM 03/03/2022    4:17 AM 03/02/2022    8:21 AM  CMP  Glucose 70 - 99 mg/dL 186  142  177   BUN 8 - 23 mg/dL 52  53  38   Creatinine 0.44 - 1.00 mg/dL 1.85  2.30  1.67   Sodium 135 - 145 mmol/L 136  139  140   Potassium 3.5 - 5.1 mmol/L 4.6  5.2  5.2    Chloride 98 - 111 mmol/L 105  106  109   CO2 22 - 32 mmol/L '22  23  22   '$ Calcium 8.9 - 10.3 mg/dL 8.3  8.2  8.6     Imaging: No results found.  Assessment/Plan:   Principal Problem:   Acute metabolic encephalopathy Active Problems:   Cocaine abuse (HCC)   AKI (acute kidney injury) ([place])   Patient Summary: Chelsea [name] is a 72 y.o. female with a past medical history of HTN, T2DM, CVA, CKD 3b, and HLD who presented with altered mental status.  Patient admitted for acute toxic encephalopathy.   #HTN  Blood pressures remained high despite amlodipine 10 mg daily and increased Coreg at 25 mg twice daily.  Did not tolerate chlorthalidone due to AKI.  -Continue home amlodipine 10 mg daily  -Continue coreg 25 mg BID -Start HCTZ 12.5 mg daily  #Acute Encephalopathy #Progressive cognitive decline #Chronic alcohol use She has shown steady improvement and appears to be stable at baseline mental status.  However she has still  not been able to get up out of bed much at all.  She was seen by PT who recommended SNF for acute rehab.  -Supportive care -Hold centrally acting medications -PT and OT following, appreciate their evaluations and recommendations  #AKI on CKD3b Patient's baseline creatinine is around 1.3-1.5. 1.82 on admission and 1.6 after fluids. Increased again to 2.3 after starting chlorthalidone. Down to 1.85 today after IVF and dc chlorthalidone. -Continue home sodium bicarb 650 mg TID -Normal saline 100 mL an hour -A.m. BMP  #Diarrhea Patient has history of intermittent constipation at home but yesterday had several loose stools that were predominantly liquid.  No dark tarry stools or blood in her stool.  This did improve and she did not have any bowel movements overnight.  If she has return of the symptoms we may add Imodium as needed but will be careful not to cause her to be constipated.  #Normocytic anemia Patient with a normocytic anemia here with hemoglobin slowly  trending down to 9.5 today from 12 on admission.  Stable at 9.2 today. Likely hemodilution overall.  No history of iron deficiency on available results.  Baseline hemoglobin does appear to be 10-12.  No known source of bleeding or signs of hemodynamic instability.  We will continue to monitor this. - A.m. CBC  #Hyperkalemia, resolved  Presented with potassium of 6.0, but did come down to 5.1.  Of note, patient was on Veltassa at one point due to hyperkalemia, and was discontinued.  Potassium 4.6 today. -Monitor BMP   #T2DM Patient has past medical history of type 2 diabetes mellitus.   -Hold home Victoza 1.8 mg daily, resume on discharge -SSI, 3 times daily CBG   #Hx of CVA #Hyperlipidemia Patient has a past medical history of CVA, leaving her with speech issues.  No acute worsening of her speech at this time.  No obvious focal neurological deficits appreciated.  Imaging was negative for any acute infarct. -Continue home aspirin 81 mg daily -Continue home atorvastatin 80 mg daily -Continue home ezetimibe 10 mg daily   #Insomnia Patient has a past medical history of insomnia, and takes trazodone nightly. -Hold trazodone, dc on discharge  Diet:  DYS 3 IVF: None VTE: Enoxaparin Code: Full PT/OT recs: SNF for Subacute PT.  Dispo: Anticipated discharge to Skilled nursing facility or home pending further evaluation physical therapy and shared decision making with patient and family.  Johny Blamer, DO Internal Medicine Resident PGY-1 Pager: [phone]  Please contact the on call pager after 5 pm and on weekends at [phone].

## 2022-03-05 DIAGNOSIS — N179 Acute kidney failure, unspecified: Secondary | ICD-10-CM | POA: Diagnosis not present

## 2022-03-05 DIAGNOSIS — R410 Disorientation, unspecified: Secondary | ICD-10-CM | POA: Diagnosis not present

## 2022-03-05 DIAGNOSIS — G9341 Metabolic encephalopathy: Secondary | ICD-10-CM | POA: Diagnosis not present

## 2022-03-05 LAB — BASIC METABOLIC PANEL
Anion gap: 9 (ref 5–15)
BUN: 48 mg/dL — ABNORMAL HIGH (ref 8–23)
CO2: 22 mmol/L (ref 22–32)
Calcium: 8.3 mg/dL — ABNORMAL LOW (ref 8.9–10.3)
Chloride: 105 mmol/L (ref 98–111)
Creatinine, Ser: 1.79 mg/dL — ABNORMAL HIGH (ref 0.44–1.00)
GFR, Estimated: 30 mL/min — ABNORMAL LOW (ref >60)
Glucose, Bld: 125 mg/dL — ABNORMAL HIGH (ref 70–99)
Potassium: 4.4 mmol/L (ref 3.5–5.1)
Sodium: 136 mmol/L (ref 135–145)

## 2022-03-05 LAB — GLUCOSE, CAPILLARY
Glucose-Capillary: 281 mg/dL — ABNORMAL HIGH (ref 70–99)
Glucose-Capillary: 297 mg/dL — ABNORMAL HIGH (ref 70–99)
Glucose-Capillary: 292 mg/dL — ABNORMAL HIGH (ref 70–99)

## 2022-03-05 MED ORDER — ENOXAPARIN SODIUM 60 MG/0.6ML IJ SOSY
0.5000 mg/kg | PREFILLED_SYRINGE | INTRAMUSCULAR | Status: DC
  Administered 2022-03-05 – 2022-03-07 (×3): 45 mg via SUBCUTANEOUS
  Filled 2022-03-05 (×3): qty 0.6

## 2022-03-05 NOTE — TOC Progression Note (Signed)
Transition of Care [facility]) - Progression Note    Patient Details  Name: PEONY BARNER MRN: [id number] Date of Birth: 1950-06-16  Transition of [facility]) CM/SW [place], [place] Phone Number: 03/05/2022, 8:56 AM  Clinical Narrative:    Patient remains with no SNF bed offers. Will continue to follow. SA resources placed on AVS.     Barriers to Discharge: Ship broker, Continued Medical Work up, Active Substance Use - Placement, SNF Pending bed offer  Expected Discharge Plan and Services In-house Referral: Clinical Social Work     Living arrangements for the past 2 months: [place]                                       Social Determinants of Health (SDOH) Interventions [place]: Low Risk  (02/17/2021)  Depression (PHQ2-9): Low Risk  (01/15/2022)  Physical Activity: Inactive (02/17/2021)  Social Connections: Unknown (02/17/2021)  Tobacco Use: Low Risk  (02/28/2022)    Readmission Risk Interventions     No data to display

## 2022-03-05 NOTE — Progress Notes (Signed)
HD#2 Subjective:   Summary: Chelsea [name] is a 72 y.o. female with a past medical history of HTN, T2DM, CVA, CKD 3b, and HLD who presented with altered mental status.  Patient admitted for acute toxic encephalopathy.  Overnight events: None  Patient is doing well this morning. No new or worsening complaints today. Feels a little confused this morning but cleared up after waking up.   Objective:  Vital signs in last 24 hours: Vitals:   03/04/22 1955 03/04/22 2327 03/05/22 0334 03/05/22 0616  BP: (!) 146/54 (!) 145/52 (!) 141/52 (!) 121/52  Pulse:  68 68 71  Resp: '20 19 15 15  '$ Temp: 97.8 F (36.6 C) 98 F (36.7 C) 98.1 F (36.7 C) 97.6 F (36.4 C)  TempSrc: Axillary Axillary Axillary Oral  SpO2: 98% 96% 99% 100%  Weight:      Height:       Supplemental O2: Room Air SpO2: 100 %   Physical Exam:  Constitutional: Elderly female, in no acute distress Cardiovascular: regular rate and rhythm Pulmonary/Chest: normal work of breathing on room air, lungs clear to auscultation bilaterally Abdominal: soft, non-tender, non-distended MSK: normal bulk and tone Neurological: alert & oriented x3, moves all extremities well against gravity Skin: warm and dry  Filed Weights   02/28/22 2206  Weight: 92 kg     Intake/Output Summary (Last 24 hours) at 03/05/2022 0700 Last data filed at 03/05/2022 0600 Gross per 24 hour  Intake --  Output 800 ml  Net -800 ml    Net IO Since Admission: 502.02 mL [03/05/22 0700]  Pertinent Labs:    Latest Ref Rng & Units 03/04/2022    2:48 AM 03/03/2022    4:17 AM 03/02/2022    8:21 AM  CBC  WBC 4.0 - 10.5 K/uL 8.0  9.6  8.6   Hemoglobin 12.0 - 15.0 g/dL 9.2  9.5  10.1   Hematocrit 36.0 - 46.0 % 28.8  28.8  31.3   Platelets 150 - 400 K/uL 200  222  228        Latest Ref Rng & Units 03/05/2022    2:26 AM 03/04/2022    2:48 AM 03/03/2022    4:17 AM  CMP  Glucose 70 - 99 mg/dL 125  186  142   BUN 8 - 23 mg/dL 48  52  53   Creatinine  0.44 - 1.00 mg/dL 1.79  1.85  2.30   Sodium 135 - 145 mmol/L 136  136  139   Potassium 3.5 - 5.1 mmol/L 4.4  4.6  5.2   Chloride 98 - 111 mmol/L 105  105  106   CO2 22 - 32 mmol/L '22  22  23   '$ Calcium 8.9 - 10.3 mg/dL 8.3  8.3  8.2     Imaging: No results found.  Assessment/Plan:   Principal Problem:   Acute metabolic encephalopathy Active Problems:   Cocaine abuse (HCC)   AKI (acute kidney injury) ([place])   Patient Summary: Chelsea [name] is a 72 y.o. female with a past medical history of HTN, T2DM, CVA, CKD 3b, and HLD who presented with altered mental status.  Patient admitted for acute toxic encephalopathy.   #HTN  Blood pressures remained high despite amlodipine 10 mg daily and increased Coreg at 25 mg twice daily.  Did not tolerate chlorthalidone due to AKI. Will start HCTZ when AKI resolved. -Continue home amlodipine 10 mg daily  -Continue coreg 25 mg BID -hold HCTZ 12.5  mg daily  #Acute Encephalopathy #Progressive cognitive decline #Chronic alcohol use She has shown steady improvement and appears to be stable at baseline mental status.  However she has still not been able to get up out of bed much at all.  She was seen by PT who recommended SNF for acute rehab.  -Supportive care -Hold centrally acting medications -PT and OT following, appreciate their evaluations and recommendations  #AKI on CKD3b Patient's baseline creatinine is around 1.3-1.5. 1.82 on admission and 1.6 after fluids. Increased again to 2.3 after starting chlorthalidone. Down to 1.79 today after HCTZ yesterday, will hold today.  -Continue home sodium bicarb 650 mg TID -A.m. BMP  #Diarrhea, resolved May add prn imodium if persistent loose stools.   #Normocytic anemia Stable at 9.2. Baseline hemoglobin does appear to be 10-12.  No known source of bleeding or signs of hemodynamic instability.  We will continue to monitor this. - A.m. CBC  #Hyperkalemia, resolved  -Monitor BMP   #T2DM  -Hold  home Victoza 1.8 mg daily, resume on discharge -SSI, 3 times daily CBG   #Hx of CVA #Hyperlipidemia Residual speech issues. No acute changes here. -Continue home aspirin 81 mg daily -Continue home atorvastatin 80 mg daily -Continue home ezetimibe 10 mg daily   #Insomnia Takes trazodone nightly at home. -Hold trazodone, dc on discharge  Diet:  DYS 3 IVF: None VTE: Enoxaparin Code: Full PT/OT recs: SNF for Subacute PT.  Dispo: Anticipated discharge to Skilled nursing facility or home pending placement or transport to [place], [place]. Daughter cannot take her to [place] until 03/10/2022.  Johny Blamer, DO Internal Medicine Resident PGY-1 Pager: [phone]  Please contact the on call pager after 5 pm and on weekends at [phone].

## 2022-03-05 NOTE — Progress Notes (Signed)
Physical Therapy Treatment Patient Details Name: Chelsea [name] MRN: [id number] DOB: 12/06/1950 Today's Date: 03/05/2022   History of Present Illness ALIKA EPPES is an 72 y.o. female, on 02/28/22 presented to ED and was found to be encephalopathic. Her UDS is positive for cocaine. with PMH of HTN, HLD, DM2, withdrawal seizures    PT Comments    Patient progressing well towards PT goals. Session focused on progressive ambulation and functional strengthening/mobility. Requires mod-Max A to stand from chair with repetitive need for proper hand placement/technique. Initially has a posterior bias in standing leading to anxiety and fear of falling but able to progress well enough to initiate gait training with min A, use of RW and close chair follow. Fatigues quickly. Continues to be appropriate for SNF. Consulted mobility tech to increase activity. Will follow.   Recommendations for follow up therapy are one component of a multi-disciplinary discharge planning process, led by the attending physician.  Recommendations may be updated based on patient status, additional functional criteria and insurance authorization.  Follow Up Recommendations  Skilled nursing-short term rehab (<3 hours/day) Can patient physically be transported by private vehicle: Yes   Assistance Recommended at Discharge Frequent or constant Supervision/Assistance  Patient can return home with the following A lot of help with bathing/dressing/bathroom;Assistance with cooking/housework;Direct supervision/assist for medications management;Direct supervision/assist for financial management;Assist for transportation;Help with stairs or ramp for entrance;Two people to help with walking and/or transfers   Equipment Recommendations  None recommended by PT    Recommendations for Other Services       Precautions / Restrictions Precautions Precautions: Fall Restrictions Weight Bearing Restrictions: No     Mobility  Bed  Mobility               General bed mobility comments: Up in chair upon PT arrival.    Transfers Overall transfer level: Needs assistance Equipment used: Rolling walker (2 wheels) Transfers: Sit to/from Stand Sit to Stand: Max assist, Mod assist           General transfer comment: Max A to stand from chair x1 progressing to mod A of 2 with manual cues for hand placement/foot placement and technique. Heavy posterior lean on first stand with cues needed to adjust feet under CoM. Anxious.    Ambulation/Gait Ambulation/Gait assistance: Min assist, Mod assist Gait Distance (Feet): 8 Feet Assistive device: Rolling walker (2 wheels) Gait Pattern/deviations: Step-to pattern, Step-through pattern, Decreased step length - left, Trunk flexed, Leaning posteriorly Gait velocity: decreased     General Gait Details: Slow, mildly unsteady gait with cues for weight shifting to advance LLE, RW management and balance. Fatigues.   Stairs             Wheelchair Mobility    Modified Rankin (Stroke Patients Only)       Balance Overall balance assessment: Needs assistance Sitting-balance support: Feet supported, No upper extremity supported Sitting balance-Leahy Scale: Good Sitting balance - Comments: Able to reach down towards socks and adjust without LOB.   Standing balance support: During functional activity, Bilateral upper extremity supported, Reliant on assistive device for balance Standing balance-Leahy Scale: Poor Standing balance comment: Requires UE support and external support due to initial posterior LOB.                            Cognition Arousal/Alertness: Awake/alert Behavior During Therapy: Anxious Overall Cognitive Status: Impaired/Different from baseline Area of Impairment: Memory, Following commands, Safety/judgement, Problem solving  Memory: Decreased short-term memory Following Commands: Follows one step commands  inconsistently, Follows one step commands with increased time Safety/Judgement: Decreased awareness of safety, Decreased awareness of deficits   Problem Solving: Slow processing, Decreased initiation, Difficulty sequencing, Requires verbal cues, Requires tactile cues General Comments: Needs repetition to follow commands, no carry over within session. Anxious and fearful of falling.        Exercises General Exercises - Lower Extremity Ankle Circles/Pumps: AROM, Both, 10 reps, Seated Quad Sets: AROM, Both, 10 reps, Seated    General Comments General comments (skin integrity, edema, etc.): Daughter left at beginning of session.      Pertinent Vitals/Pain Pain Assessment Pain Assessment: No/denies pain    Home Living                          Prior Function            PT Goals (current goals can now be found in the care plan section) Progress towards PT goals: Progressing toward goals    Frequency    Min 3X/week      PT Plan Current plan remains appropriate    Co-evaluation              AM-PAC PT "6 Clicks" Mobility   Outcome Measure  Help needed turning from your back to your side while in a flat bed without using bedrails?: A Little Help needed moving from lying on your back to sitting on the side of a flat bed without using bedrails?: A Lot Help needed moving to and from a bed to a chair (including a wheelchair)?: A Lot Help needed standing up from a chair using your arms (e.g., wheelchair or bedside chair)?: A Lot Help needed to walk in hospital room?: Total Help needed climbing 3-5 steps with a railing? : Total 6 Click Score: 11    End of Session Equipment Utilized During Treatment: Gait belt Activity Tolerance: Patient tolerated treatment well Patient left: in chair;with call bell/phone within reach;with chair alarm set Nurse Communication: Mobility status PT Visit Diagnosis: Unsteadiness on feet (R26.81);Other abnormalities of gait and  mobility (R26.89);Muscle weakness (generalized) (M62.81);Difficulty in walking, not elsewhere classified (R26.2);Other symptoms and signs involving the nervous system (R29.898)     Time: 9528-4132 PT Time Calculation (min) (ACUTE ONLY): 19 min  Charges:  $Gait Training: 8-22 mins                     Marisa Severin, PT, DPT Acute Rehabilitation Services Secure chat preferred Office [place] 03/05/2022, 12:27 PM

## 2022-03-05 NOTE — Inpatient Diabetes Management (Signed)
Inpatient Diabetes Program Recommendations  AACE/ADA: New Consensus Statement on Inpatient Glycemic Control (2015)  Target Ranges:  Prepandial:   less than 140 mg/dL      Peak postprandial:   less than 180 mg/dL (1-2 hours)      Critically ill patients:  140 - 180 mg/dL   Lab Results  Component Value Date   GLUCAP 337 (H) 03/04/2022   HGBA1C 7.0 (A) 01/15/2022    Review of Glycemic Control  Latest Reference Range & Units 03/04/22 08:25 03/04/22 13:13 03/04/22 16:19  Glucose-Capillary 70 - 99 mg/dL 219 (H) 249 (H) 337 (H)   Diabetes history: DM 2 Outpatient Diabetes medications: Victoza 1.8 mg Daily Current orders for Inpatient glycemic control:  Novolog 0-15 units tid  A1c 7% on 12/4 Elevated renal function  Glucose trends increase after meal intake  Inpatient Diabetes Program Recommendations:    -  May consider starting Novolog 3 units tid meal coverage if eating >50% of meals.  Thanks, Tama Headings RN, MSN, BC-ADM Inpatient Diabetes Coordinator Team Pager [phone] (8a-5p)

## 2022-03-06 LAB — BASIC METABOLIC PANEL
Anion gap: 8 (ref 5–15)
BUN: 49 mg/dL — ABNORMAL HIGH (ref 8–23)
CO2: 25 mmol/L (ref 22–32)
Calcium: 8.7 mg/dL — ABNORMAL LOW (ref 8.9–10.3)
Chloride: 103 mmol/L (ref 98–111)
Creatinine, Ser: 1.62 mg/dL — ABNORMAL HIGH (ref 0.44–1.00)
GFR, Estimated: 33 mL/min — ABNORMAL LOW (ref >60)
Glucose, Bld: 168 mg/dL — ABNORMAL HIGH (ref 70–99)
Potassium: 4.2 mmol/L (ref 3.5–5.1)
Sodium: 136 mmol/L (ref 135–145)

## 2022-03-06 LAB — CBC
HCT: 30.8 % — ABNORMAL LOW (ref 36.0–46.0)
Hemoglobin: 9.9 g/dL — ABNORMAL LOW (ref 12.0–15.0)
MCH: 28.8 pg (ref 26.0–34.0)
MCHC: 32.1 g/dL (ref 30.0–36.0)
MCV: 89.5 fL (ref 80.0–100.0)
Platelets: 232 10*3/uL (ref 150–400)
RBC: 3.44 MIL/uL — ABNORMAL LOW (ref 3.87–5.11)
RDW: 14.1 % (ref 11.5–15.5)
WBC: 8 10*3/uL (ref 4.0–10.5)
nRBC: 0 % (ref 0.0–0.2)

## 2022-03-06 LAB — GLUCOSE, CAPILLARY
Glucose-Capillary: 188 mg/dL — ABNORMAL HIGH (ref 70–99)
Glucose-Capillary: 292 mg/dL — ABNORMAL HIGH (ref 70–99)
Glucose-Capillary: 316 mg/dL — ABNORMAL HIGH (ref 70–99)
Glucose-Capillary: 180 mg/dL — ABNORMAL HIGH (ref 70–99)

## 2022-03-06 MED ORDER — MELATONIN 3 MG PO TABS: 3.0000 mg | ORAL_TABLET | Freq: Every day | ORAL | Status: DC

## 2022-03-06 NOTE — Progress Notes (Signed)
HD#2 Subjective:   Summary: Chelsea [name] is a 72 y.o. female with a past medical history of HTN, T2DM, CVA, CKD 3b, and HLD who presented with altered mental status.  Patient admitted for acute toxic encephalopathy.  Overnight events: None  Patient is doing well this morning. No new or worsening complaints today.   Objective:  Vital signs in last 24 hours: Vitals:   03/04/22 1955 03/04/22 2327 03/05/22 0334 03/05/22 0616  BP: (!) 146/54 (!) 145/52 (!) 141/52 (!) 121/52  Pulse:  68 68 71  Resp: '20 19 15 15  '$ Temp: 97.8 F (36.6 C) 98 F (36.7 C) 98.1 F (36.7 C) 97.6 F (36.4 C)  TempSrc: Axillary Axillary Axillary Oral  SpO2: 98% 96% 99% 100%  Weight:      Height:       Supplemental O2: Room Air SpO2: 100 %   Physical Exam:  Constitutional: Elderly female, in no acute distress Cardiovascular: regular rate and rhythm Pulmonary/Chest: normal work of breathing on room air, lungs clear to auscultation bilaterally Abdominal: soft, non-tender, non-distended MSK: normal bulk and tone Neurological: alert & oriented x3, moves all extremities well against gravity Skin: warm and dry  Filed Weights   02/28/22 2206  Weight: 92 kg     Intake/Output Summary (Last 24 hours) at 03/05/2022 0700 Last data filed at 03/05/2022 0600 Gross per 24 hour  Intake --  Output 800 ml  Net -800 ml    Net IO Since Admission: 502.02 mL [03/05/22 0700]  Pertinent Labs:    Latest Ref Rng & Units 03/04/2022    2:48 AM 03/03/2022    4:17 AM 03/02/2022    8:21 AM  CBC  WBC 4.0 - 10.5 K/uL 8.0  9.6  8.6   Hemoglobin 12.0 - 15.0 g/dL 9.2  9.5  10.1   Hematocrit 36.0 - 46.0 % 28.8  28.8  31.3   Platelets 150 - 400 K/uL 200  222  228        Latest Ref Rng & Units 03/05/2022    2:26 AM 03/04/2022    2:48 AM 03/03/2022    4:17 AM  CMP  Glucose 70 - 99 mg/dL 125  186  142   BUN 8 - 23 mg/dL 48  52  53   Creatinine 0.44 - 1.00 mg/dL 1.79  1.85  2.30   Sodium 135 - 145 mmol/L 136  136   139   Potassium 3.5 - 5.1 mmol/L 4.4  4.6  5.2   Chloride 98 - 111 mmol/L 105  105  106   CO2 22 - 32 mmol/L '22  22  23   '$ Calcium 8.9 - 10.3 mg/dL 8.3  8.3  8.2     Imaging: No results found.  Assessment/Plan:   Principal Problem:   Acute metabolic encephalopathy Active Problems:   Cocaine abuse (HCC)   AKI (acute kidney injury) ([place])   Patient Summary: Chelsea [name] is a 72 y.o. female with a past medical history of HTN, T2DM, CVA, CKD 3b, and HLD who presented with altered mental status.  Patient admitted for acute toxic encephalopathy.   #HTN Blood pressures remained high despite amlodipine 10 mg daily and increased Coreg at 25 mg twice daily.  Did not tolerate chlorthalidone due to AKI. Will start HCTZ when AKI resolved. -Continue home amlodipine 10 mg daily  -Continue coreg 25 mg BID -hold HCTZ 12.5 mg daily  #Acute Encephalopathy #Progressive cognitive decline #Chronic alcohol use She has  shown steady improvement and appears to be stable at baseline mental status.   #AKI on CKD3b #Hyperkalemia, resolved  Patient's baseline creatinine is around 1.3-1.5. 1.82 on admission and 1.6 after fluids. Increased again to 2.3 after starting chlorthalidone. Down to 1.62. -Continue home sodium bicarb 650 mg TID -A.m. BMP  #Diarrhea, resolved May add prn imodium if persistent loose stools.   #Normocytic anemia Stable at 9.9. Baseline hemoglobin does appear to be 10-12.  No known source of bleeding or signs of hemodynamic instability.  We will continue to monitor this. - A.m. CBC   #T2DM  -Hold home Victoza 1.8 mg daily, resume on discharge -SSI, 3 times daily CBG   #Hx of CVA #Hyperlipidemia Residual speech issues. No acute changes here. -Continue home aspirin 81 mg daily -Continue home atorvastatin 80 mg daily -Continue home ezetimibe 10 mg daily   #Insomnia Takes trazodone nightly at home. -Hold trazodone, dc on discharge  Diet:  DYS 3 IVF: None VTE:  Enoxaparin Code: Full PT/OT recs: SNF for Subacute PT.  Dispo: Anticipated discharge to Skilled nursing facility or home pending placement or transport to [place], [place]. Daughter cannot take her to [place] until 03/10/2022.  Patient is medically ready for discharge and barrier is family does not want patient to go back to previous living situation, SNF is not accepting, and patient cannot be transported to [place] until Saturday as above.  Johny Blamer, DO Internal Medicine Resident PGY-1 Pager: [phone]  Please contact the on call pager after 5 pm and on weekends at [phone].

## 2022-03-06 NOTE — Progress Notes (Signed)
Mobility Specialist Progress Note:    03/06/22 1047  Mobility  Activity Transferred from bed to chair  Level of Assistance Minimal assist, patient does 75% or more  Assistive Device Front wheel walker  Distance Ambulated (ft) 4 ft  Activity Response Tolerated well  Mobility Referral Yes  $Mobility charge 1 Mobility   Pt was agreeable to mobility session. MinA required to stand. Left pt in chair with alarm on and all needs met.   Royetta Crochet Mobility Specialist Please contact via S[employer] or  Rehab office at [phone]

## 2022-03-06 NOTE — TOC Progression Note (Signed)
Transition of Care [facility]) - Progression Note    Patient Details  Name: Chelsea [name] MRN: [id number] Date of Birth: [birth year]  Transition [facility]) CM/SW Contact  Levonne Lapping, RN Phone Number: 03/06/2022, 10:16 AM  Clinical Narrative:    Met with Patient bedside to discuss DC plan. Patient states she doesn't know what the plan is  Her Daughter and Granddaughter were "trying to figure it out" Patient gave CM permission to contact Daughter . CM left voice mail for Daughter, [name] at number listed in demographics [phone]. TOC will continue to follow patient for any additional discharge planning needs        Barriers to Discharge: Ship broker, Continued Medical Work up, Active Substance Use - Placement, SNF Pending bed offer  Expected Discharge Plan and Services In-house Referral: Clinical Social Work     Living arrangements for the past 2 months: [place]                                       Social Determinants of Health (SDOH) Interventions [place]: Low Risk  (02/17/2021)  Depression (PHQ2-9): Low Risk  (01/15/2022)  Physical Activity: Inactive (02/17/2021)  Social Connections: Unknown (02/17/2021)  Tobacco Use: Low Risk  (02/28/2022)    Readmission Risk Interventions     No data to display

## 2022-03-06 NOTE — Care Management Important Message (Signed)
Important Message  Patient Details  Name: Chelsea [name] MRN: [id number] Date of Birth: 1950-10-31   Medicare Important Message Given:  Yes     Sophi Calligan 03/06/2022, 1:08 PM

## 2022-03-07 ENCOUNTER — Other Ambulatory Visit (HOSPITAL_COMMUNITY): Payer: Self-pay

## 2022-03-07 DIAGNOSIS — G9341 Metabolic encephalopathy: Secondary | ICD-10-CM | POA: Diagnosis not present

## 2022-03-07 LAB — CBC
HCT: 27.4 % — ABNORMAL LOW (ref 36.0–46.0)
Hemoglobin: 8.9 g/dL — ABNORMAL LOW (ref 12.0–15.0)
MCH: 29 pg (ref 26.0–34.0)
MCHC: 32.5 g/dL (ref 30.0–36.0)
MCV: 89.3 fL (ref 80.0–100.0)
Platelets: 237 10*3/uL (ref 150–400)
RBC: 3.07 MIL/uL — ABNORMAL LOW (ref 3.87–5.11)
RDW: 13.8 % (ref 11.5–15.5)
WBC: 8.3 10*3/uL (ref 4.0–10.5)
nRBC: 0 % (ref 0.0–0.2)

## 2022-03-07 LAB — GLUCOSE, CAPILLARY
Glucose-Capillary: 208 mg/dL — ABNORMAL HIGH (ref 70–99)
Glucose-Capillary: 204 mg/dL — ABNORMAL HIGH (ref 70–99)
Glucose-Capillary: 129 mg/dL — ABNORMAL HIGH (ref 70–99)

## 2022-03-07 LAB — BASIC METABOLIC PANEL
Anion gap: 9 (ref 5–15)
BUN: 61 mg/dL — ABNORMAL HIGH (ref 8–23)
CO2: 24 mmol/L (ref 22–32)
Calcium: 8.2 mg/dL — ABNORMAL LOW (ref 8.9–10.3)
Chloride: 102 mmol/L (ref 98–111)
Creatinine, Ser: 1.9 mg/dL — ABNORMAL HIGH (ref 0.44–1.00)
GFR, Estimated: 28 mL/min — ABNORMAL LOW (ref >60)
Glucose, Bld: 184 mg/dL — ABNORMAL HIGH (ref 70–99)
Potassium: 4.6 mmol/L (ref 3.5–5.1)
Sodium: 135 mmol/L (ref 135–145)

## 2022-03-07 MED ORDER — CARVEDILOL 25 MG PO TABS
25.0000 mg | ORAL_TABLET | Freq: Two times a day (BID) | ORAL | 2 refills | Status: DC
  Filled 2022-03-07: qty 60, 30d supply, fill #0

## 2022-03-07 NOTE — Progress Notes (Signed)
OT Cancellation Note  Patient Details Name: Chelsea [name] MRN: [id number] DOB: 1950-10-16   Cancelled Treatment:    Reason Eval/Treat Not Completed: Other (comment) Patient working with PT upon OT entry, OT will follow back as time permits.   Corinne Ports E. Malaijah Houchen, OTR/L Acute Rehabilitation Services [phone]   Ascencion Dike 03/07/2022, 10:52 AM

## 2022-03-07 NOTE — Progress Notes (Signed)
Discharge teaching completed with patient's daughter [name]. Patient discharged home with her meds and new wheelchair with her daughter.

## 2022-03-07 NOTE — Discharge Instructions (Signed)
In a time of Crisis: [facility]  Mobile Crisis Management provides immediate crisis response, 24/7.  Call [phone]  [facility]/DD/SA Services Access Center is available 24 hours a day, 7 days a week. Customer Service Specialists will assist you to find a crisis provider that is well-matched with your needs. Your local number is: [phone]  [facility] ([facility]) IOP, individual counseling, medication management [address] [place], [place] [address] [phone] Call for intake hours; Medicaid and Uninsured    Outpatient Providers  [facility] ([facility]) Group and individual counseling. [address]  [place], [place] [address] [phone] [place]: [phone]  [place]: [phone] Medicaid and uninsured.   [facility] Offers IOP groups multiple times per week. [address], [place], [place] [address] [phone] Takes Medicaid and other insurances.   [facility]  Chemical Dependency Intensive Outpatient Program (IOP) [address] [place], [place] [address] [phone] Takes commercial insurance and Medicare.   [facility]  IOP and Partial Hospitalization Program  [address]  [place], [place] [address] [phone] Private Insurance, Medicaid only for partial hospitalization  [facility] [address], [place], [place] [address] [phone] Monday-Friday. Short and Long term options. [facility] ([facility]) IOP, individual counseling, medication management [address] [place], [place] [address] [phone] Medicaid and Uninsured  [facility] [address]  [place], [place] [address] [phone] Private Insurance and Self Pay   [place] [facility] [address]  [place], [place] [address] [phone] Private Insurance, Medicaid, and Self Pay   [facility]  [address]  [place], [place] [address] [facility]  [address]  [place], [place] [address] [phone]  [facility]  [address] [place], [place] [address] [phone]      Residential Treatment Programs  [facility] [address] [place], [place] [address] [phone] or [phone] Detox and Residential Rehab 14 days (Medicare, Medicaid, private insurance, and self pay). No methadone. Call for pre-screen.   [facility]) [address]  [place], [place] [address] [phone] Detox (self Pay and Medicaid Limited availability) Rehab Only Female (Medicare, Medicaid, and Self Pay)-No methadone.  [facility] [address] [place], [place] [address] [phone] or [phone] Private Insurance only  [facility] PHONE: [phone] FAX: [phone] Residential program for women 21 and over for up to a year through a Christian 12-step recovery model. Self-pay.       [facility] [address] [place], [place] [address] Phone:  [phone] Must be detoxed 72 hours prior to admission; 28 day program.  Self-pay.  [facility] [address]  [place], [place] [phone] Commercial insurance, Medicare, Medicaid (not straight Medicaid). They offer assistance with transportation.   [place] [facility] [address],  [place], [place] [address] [phone] Christian Based Program. Men only. No insurance  [facility] is a substance use disorder treatment program for women, including those who are pregnant, parenting, and/or whose lives have been touched by abuse and violence. [phone]) [phone]         [facility] [address] [place], [place] [address] Women's:  [phone] Men's: [phone] No Medicaid.     [facility] Locations across the [place] (mainly [place]) willing to help with transportation.  [phone] Michelle Commercial insurance plans. [facility]  [address]  [place], [place] [address] [phone] Treatment Only, must make assessment appointment, and must be sober for assessment appointment. Self pay, Medicare A and B, [place] Medicaid, must be [place] resident. No methadone.   [facility]  [address] [place], [place] [address] [phone] No pending legal charges, Long-term work program. No methadone. Call for assessment.  [facility]  [address], [place], [place] [address] [phone] or [phone] Commercial Insurance Only  [facility] - [phone] Admissions--[phone] Private Insurance (no Medicaid). Males/Females, call to make referrals, multiple facilities   [facility] [address],  [place], [place] [address]  [phone] Men Only Upfront Fee [facility]: [facility] [address] [place], [place] [address] [phone]  [facility]-no methadone [facility] [address] [place], [place] [address] [phone] (p) [phone] (f)  [facility] [address] [place], [place] [address] [phone] (p) [phone] (f)  [facility] [phone] [place], [place] For women, houses 8 residents for sober living. No Medicaid.         [facility] Program Due to COVID-19, syringe services programs are likely operating under different hours with limited or no fixed site hours. Some programs may not be operating at all. Please contact the program directly using the phone numbers provided below to see if they are still operating under COVID-19. -[place] [facility] ([facility]) Fixed; mobile;  peer-based Tyler Yates [phone] [email or web address] Fixed site exchange at [employer], [address] [place], [place] [address] on Wednesdays (2:00 - 5:00 pm) and Thursdays (4:00 - 8:00 pm). Pop-up mobile exchange locations: [facility], [address] [place], [place] [address] on Tuesdays (11:00 am - 1:00 pm) and Fridays (11:00 am - 1:00 pm) -[facility] - [address], [place], [place] [address] on Tuesdays (2:00 - 4:00 pm) and Fridays (2:00 - 4:00 pm) -[facility] - also serves [place] Access Fixed; mobile; peer-based Louise Vincent [phone] [email or web address] [address] [place], [place] [address] Delivery and outreach available in [place] and [place], please call for more information. Monday, Tuesday: 1:00 -7:00 pm Thursday: 4:00 pm - 8:00 pm Friday: 1:00 pm - 8:00 pm)       [facility]ication[facility]Assisted Treatment (MAT) -[facility]- services [place] and surrounding areas including [place], [place], [place], [place], [place], [place], [place], [place], [place], and [place], [place]. Options include Methadone, buprenorphine or Suboxone. [address], [address] [place], [place] [address] Phone: [phone] Mon - Fri: 5:30am - 2:00pm Sat: 5:30am -7:30am Sun: Closed Holidays: 6:00am - 8:00am  -[facility]- We use FDA-approved medications, like methadone/suboxone/sublocade, and vivitrol. These medications are then combined with customized care plans that include individual or group counseling, toxicology, and medical care directed by on-site physicians. Accepts most insurance plans, Medicaid, and private pay.  [address] [place],  [address] Phone: [phone] Monday-Friday 5:00 AM - 10:00 AM Saturday 6:00 AM - 8:30 AM Sunday 6:00 AM - 7:00 AM  -[facility]- [facility] is a treatment & recovery  focused program. In addition to receiving methadone medication, our clients participate in individual and group counseling as well as random drug testing. If accepted into   the [facility] Opioid Program, you will be provided several intake appointments and a physical exam [address] [place], [place] [address] Office: [phone]  Fax: [phone]  -[facility]- We put our community members at the center of everything we do, for remote treatment services as well as in-person, from alcohol withdrawal to opioid use and more.  [address], [place], [place] [address] [phone] Monday-Wednesday: 9:00am - 5:00pm Thursday: 9:00am - 6:00pm Friday: 9:00am - 5:00pm Saturday: 9:00am - 1:00pm Sunday: Closed   -[place] [facility] (Or [place]) [address], [place], [place] [address] [phone]  [place] [phone] [address] [place], [place] [address]  M-W    5:00am-12:00pm Thu     5:00am-10:00am Fri       5:00am-12:00pm Sat      5:00am-8:00am Sun     Closed  $12/daily for Methadone Treatment.  

## 2022-03-07 NOTE — TOC Transition Note (Signed)
Transition of Care [facility]) - CM/SW Discharge Note   Patient Details  Name: Chelsea [name] MRN: [id number] Date of Birth: 1950/12/24  Transition [facility] Care [facility]) CM/SW Contact:  Levonne Lapping, RN Phone Number: 03/07/2022, 1:32 PM   Clinical Narrative:     Patients Daughter to pick patient up after work today.  Patient has requested a wheelchair and one has been ordered from [facility]- No Preference of providers- . SNF was original recommendation but no accepting facilitites. CM tried to [facility] but also no accepting [facility] agencies due to her Hx of cocaine addiction. [employer] to deliver wheelchair to bedside prior to dc. No additional TOC needs        Barriers to Discharge: Ship broker, Continued Medical Work up, Active Substance Use - Placement, SNF Pending bed offer   Patient Goals and CMS Choice      Discharge Placement                         Discharge Plan and Services Additional resources added to the After Visit Summary for   In-house Referral: Clinical Social Work                                   Social Determinants of Health ([place]) Interventions [place]: Low Risk  (02/17/2021)  Depression (PHQ2-9): Low Risk  (01/15/2022)  Physical Activity: Inactive (02/17/2021)  Social Connections: Unknown (02/17/2021)  Tobacco Use: Low Risk  (02/28/2022)     Readmission Risk Interventions     No data to display

## 2022-03-07 NOTE — Discharge Summary (Signed)
Name: Chelsea [name] MRN: [id number] DOB: December 10, 1950 72 y.o. PCP: Farrel Gordon, DO  Date of Admission: 02/28/2022  9:26 AM Date of Discharge:  03/07/2021 Attending Physician: Dr.  Johnnye Sima  DISCHARGE DIAGNOSIS:  Primary Problem: Acute metabolic encephalopathy   Hospital Problems: Principal Problem:   Acute metabolic encephalopathy Active Problems:   Cocaine abuse ([place])   AKI (acute kidney injury) ([place])   Delirium    DISCHARGE MEDICATIONS:   Allergies as of 03/07/2022       Reactions   Ace Inhibitors Other (See Comments)   Severe hyperkalemia Jan 2023 from ARB, assumed ACEi would have same effect   Aldactone [spironolactone] Other (See Comments)   Severe hyperkalemia Jan 2023   Angiotensin Receptor Blockers Other (See Comments)   Severe hyperkalemia Jan 2023   Atarax [hydroxyzine] Other (See Comments)   Hallucinations   Nsaids Nausea And Vomiting   Penicillins Other (See Comments)   tremors   Zestril [lisinopril] Cough        Medication List     STOP taking these medications    chlorthalidone 25 MG tablet Commonly known as: HYGROTON       TAKE these medications    albuterol 108 (90 Base) MCG/ACT inhaler Commonly known as: Ventolin HFA INHALE 2 PUFFS into lungs EVERY 6 HOURS AS NEEDED FOR WHEEZING OR SHORTNESS OF BREATH   amLODipine 10 MG tablet Commonly known as: NORVASC TAKE 1 TABLET BY MOUTH EVERY DAY What changed: when to take this   Aspirin Low Dose 81 MG tablet Generic drug: aspirin EC TAKE 1 TABLET BY MOUTH EVERY DAY What changed: when to take this   atorvastatin 80 MG tablet Commonly known as: LIPITOR Take 1 tablet (80 mg total) by mouth daily.   carvedilol 25 MG tablet Commonly known as: COREG Take 1 tablet (25 mg total) by mouth 2 (two) times daily with a meal. What changed:  medication strength how much to take   ezetimibe 10 MG tablet Commonly known as: Zetia Take 1 tablet (10 mg total) by mouth daily. What changed: when to  take this   fluticasone 50 MCG/ACT nasal spray Commonly known as: FLONASE USE 2 SPRAYS IN EACH NOSTRIL EVERY DAY   liraglutide 18 MG/3ML Sopn Commonly known as: VICTOZA Inject 1.8 mg into the skin daily. What changed:  how much to take when to take this   sodium bicarbonate 650 MG tablet TAKE 1 TABLET BY MOUTH 3 TIMES DAILY   traZODone 50 MG tablet Commonly known as: DESYREL TAKE HALF TO ONE TABLET BY MOUTH AT BEDTIME What changed:  how much to take how to take this when to take this additional instructions        DISPOSITION AND FOLLOW-UP:  Chelsea [name] was discharged from [facility] in Stable condition. At the hospital follow up visit please address:  #Acute Encephalopathy Monitor patient for acute changes in mental status. Her daughter, [name], is very in tune with her care and baseline mental status.    #AKI on CKD stage 3b Recheck BMP.  #HTN Adjust anti-hypertensive regimen as needed. She cannot tolerate ACEI,  spironolactone due to severe hyperkalemia; ARBs have not been tried due to assumption that she would have hyperkalemia from these agents as well. Thiazides may be attempted again, but renal function and history of hypokalemia on HCTZ may hinder this.  Follow-up Recommendations: Consults: None Labs: Basic Metabolic Profile Studies: None Medications:   START these medications Carvedilol 25 mg BID STOP these  medications Carvedilol 12.5 mg BID Chlorthalidone 25 mg daily No other medication changes made  Follow-up Appointments:  Follow-up Information     Farrel Gordon, DO. Schedule an appointment as soon as possible for a visit.   Specialty: Internal Medicine Why: Our clinic will call to schedule a follow-up appointment in the next 1-2 weeks. Contact information: [place] [address] [phone]                 HOSPITAL COURSE:  Patient Summary: #Acute Encephalopathy Patient presented to [facility]  ED due to altered mental status, with behavior that included the patient screaming, giggling, and believing that someone was in the room with her. She underwent psychiatric evaluation at the home after calling 911, and the decision was ultimately made to bring her in for further evaluation. On arrival she was hemodynamically stable; had negative respiratory virus panel; normal lactic acid, TSH, and ammonia levels; UA was not concerning for UTI; and CT head did not show acute intracranial processes. UDS was obtained and positive for cocaine. Her encephalopathy resolved prior to discharge and is attributed to cocaine use.   #AKI on CKD3b Serum creatinine elevated to 1.82 with apparent baseline of around 1.3-1.5. This was thought to be pre-renal in the setting of poor PO intake, specifically with patient having no food or drink during the daytime while she underwent evaluation in the ED. She received IVF overnight and home sodium bicarbonate 650 mg TID was continued, with resolution of AKI evidenced by serum creatinine 1.62.   #Hyperkalemia, resolved  Initial potassium of 6.0, which improved to 5.3 on morning labs. Her potassium remained normal prior to discharge.   #HTN Patient was hypertensive on arrival to [facility] ED. Her home amlodipine and carvedilol were continued and chlorthalidone was held in the setting of AKI. Amlodipine was restarted with persistence of hypertension, and when chlorthalidone was restarted she had new acute kidney injury. This persisted when chlorthalidone was changed to HCTZ. HCTZ was ultimately held with improvement of renal function. Her BP remained stable with single therapy.   #T2DM Managed with SSI.   #Hx of CVA #Hyperlipidemia Home aspirin, atorvastatin, and zetia were continued.   #Insomnia Home trazodone was held in the setting of altered mental status.   DISCHARGE INSTRUCTIONS:   Discharge Instructions     Discharge instructions   Complete by: As directed    Chelsea [name],  It was a pleasure to care for you during your stay at [facility].  When you go home there will be two medication changes: 1. STOP taking chlorthalidone 2. INCREASE carvedilol to 25 mg twice daily  Our clinic will contact you to schedule a follow-up appointment in the next 1-2 weeks. Please let us know if you need anything sooner!  My best, Dr. Marlou Sa   Increase activity slowly   Complete by: As directed        SUBJECTIVE:  Patient is doing well this morning. No new or worsening complaints today.   Discharge Vitals:   BP (!) 140/47 (BP Location: Right Arm)   Pulse 64   Temp 99.2 F (37.3 C)   Resp 16   Ht '5\' 4"'$  (1.626 m)   Wt 92 kg   SpO2 99%   BMI 34.81 kg/m   OBJECTIVE:  Constitutional: Elderly female, in no acute distress Cardiovascular: regular rate and rhythm Pulmonary/Chest: normal work of breathing on room air, lungs clear to auscultation bilaterally Abdominal: soft, non-tender, non-distended MSK: normal bulk and  tone Neurological: alert & oriented x3, moves all extremities well against gravity Skin: warm and dry  Pertinent Labs, Studies, and Procedures:     Latest Ref Rng & Units 03/07/2022    7:56 AM 03/06/2022    7:22 AM 03/04/2022    2:48 AM  CBC  WBC 4.0 - 10.5 K/uL 8.3  8.0  8.0   Hemoglobin 12.0 - 15.0 g/dL 8.9  9.9  9.2   Hematocrit 36.0 - 46.0 % 27.4  30.8  28.8   Platelets 150 - 400 K/uL 237  232  200        Latest Ref Rng & Units 03/07/2022    7:56 AM 03/06/2022    7:22 AM 03/05/2022    2:26 AM  CMP  Glucose 70 - 99 mg/dL 184  168  125   BUN 8 - 23 mg/dL 61  49  48   Creatinine 0.44 - 1.00 mg/dL 1.90  1.62  1.79   Sodium 135 - 145 mmol/L 135  136  136   Potassium 3.5 - 5.1 mmol/L 4.6  4.2  4.4   Chloride 98 - 111 mmol/L 102  103  105   CO2 22 - 32 mmol/L '24  25  22   '$ Calcium 8.9 - 10.3 mg/dL 8.2  8.7  8.3     MR BRAIN WO CONTRAST  Result Date: 02/28/2022 CLINICAL DATA:  Altered mental status EXAM: MRI HEAD WITHOUT CONTRAST  TECHNIQUE: Multiplanar, multiecho pulse sequences of the brain and surrounding structures were obtained without intravenous contrast. COMPARISON:  MRI head 10/06/2017, correlation is also made with CT head 02/28/2022 FINDINGS: Evaluation is somewhat limited by motion artifact. Brain: No restricted diffusion to suggest acute or subacute infarct.No acute hemorrhage, mass, mass effect, or midline shift. No hydrocephalus or extra-axial collection.Small foci of hemosiderin deposition throughout the cerebral hemispheres, most prominently in the bilateral anterior temporal lobes (series 14, image 29), as well as in the deep gray structures.Advanced cerebral atrophy for age.Remote infarct in the right anterior temporal lobe. Remote lacunar infarcts in the bilateral basal ganglia, left greater than right corona radiata, and pons. T2 hyperintense signal in the periventricular white matter and pons, likely the sequela of moderate chronic small vessel ischemic disease. Vascular: Patent arterial flow voids. Skull and upper cervical spine: Normal marrow signal. Sinuses/Orbits: Clear paranasal sinuses.No acute finding in the orbits. Other: The mastoid air cells are well aerated. IMPRESSION: 1. No acute intracranial process. No evidence of acute or subacute infarct. 2. Foci of hemosiderin deposition throughout the bilateral cerebral hemispheres, most prominent in the anterior temporal lobes, as well as in the deep gray structures. While the more central foci are commonly seeing in the setting of hypertensive microhemorrhages, the more peripheral foci could be seen in the setting of cerebral amyloid angiopathy. Electronically Signed   By: Merilyn Baba M.D.   On: 02/28/2022 23:37   DG Chest 1 View  Result Date: 02/28/2022 CLINICAL DATA:  Altered mental status. EXAM: CHEST  1 VIEW COMPARISON:  Chest radiographs 04/13/2011 FINDINGS: The cardiac silhouette is mildly enlarged. No airspace consolidation, edema, sizable pleural  effusion, or pneumothorax is identified. No acute osseous abnormality is seen. IMPRESSION: No active disease. Electronically Signed   By: Logan Bores M.D.   On: 02/28/2022 10:48   CT HEAD WO CONTRAST (5MM)  Result Date: 02/28/2022 CLINICAL DATA:  Mental status change, history of stroke. EXAM: CT HEAD WITHOUT CONTRAST TECHNIQUE: Contiguous axial images were obtained from the base of the skull  through the vertex without intravenous contrast. RADIATION DOSE REDUCTION: This exam was performed according to the departmental dose-optimization program which includes automated exposure control, adjustment of the mA and/or kV according to patient size and/or use of iterative reconstruction technique. COMPARISON:  October 06, 2017 CT examination FINDINGS: Brain: No evidence of acute infarction, hemorrhage, hydrocephalus, extra-axial collection or mass lesion/mass effect. Mild generalized cerebral atrophy. Patchy area of low-attenuation of the periventricular and subcortical white matter presumed chronic microvascular ischemic changes. Vascular: No hyperdense vessel or unexpected calcification. Skull: Normal. Negative for fracture or focal lesion. Sinuses/Orbits: No acute finding. Other: None. IMPRESSION: 1. No acute intracranial abnormality. 2. Mild generalized cerebral atrophy and chronic microvascular ischemic changes of the white matter. Electronically Signed   By: Keane Police D.O.   On: 02/28/2022 10:40     Signed: Johny Blamer, DO Internal Medicine Resident, PGY-1 Pager# [phone]

## 2022-03-07 NOTE — Plan of Care (Signed)
Patient discharging home with daughter late this afternoon

## 2022-03-07 NOTE — TOC Progression Note (Addendum)
Transition of Care [facility]) - Progression Note    Patient Details  Name: Chelsea [name] MRN: [id number] Date of Birth: Mar 10, 1950  Transition of Care [facility]) CM/SW Contact  Levonne Lapping, RN Phone Number: 03/07/2022, 9:05 AM  Clinical Narrative:      UPDATE 10:52 AM CM received secure chat that Daughter was in room during rounds today and told Provider she will pick her Mother up after work today. Pateint will DC to Daughter's home    CM left VM #2 for Daughter [name] to return call to discuss DC plan. CM will attempt again and also try granddaughter with patient permission. TOC will continue to follow patient for any additional discharge needs        Barriers to Discharge: Ship broker, Continued Medical Work up, Active Substance Use - Placement, SNF Pending bed offer  Expected Discharge Plan and Services In-house Referral: Clinical Social Work     Living arrangements for the past 2 months: [place]                                       Social Determinants of Health (SDOH) Interventions [place]: Low Risk  (02/17/2021)  Depression (PHQ2-9): Low Risk  (01/15/2022)  Physical Activity: Inactive (02/17/2021)  Social Connections: Unknown (02/17/2021)  Tobacco Use: Low Risk  (02/28/2022)    Readmission Risk Interventions     No data to display

## 2022-03-07 NOTE — Progress Notes (Signed)
Physical Therapy Treatment Patient Details Name: Chelsea [name] MRN: [id number] DOB: 1950/03/10 Today's Date: 03/07/2022   History of Present Illness Chelsea [name] is an 72 y.o. female, on 02/28/22 presented to ED and was found to be encephalopathic. Her UDS is positive for cocaine. with PMH of HTN, HLD, DM2, withdrawal seizures    PT Comments    Pt tolerated today's session well but limited by fatigue and reports of dizziness, see BP below, although questionable accuracy as pt utilizing arm during attempts at readings. Pt pleasantly confused throughout session, attempted to reorient. Pt able to stand ~5 times today, several attempts required to get BP in standing. Pt scooting along EOB with min guard for safety, modA needed to stand, with min-modA needed for bed mobility and LE management. Unable to progress to ambulation today due to drop in BP and pt reporting feeling symptomatic, RN notified. Acute PT will continue to follow as appropriate, discharge recommendations remain.   BP sitting: 120/104(111) mmHg BP standing: 106/49(66) mmHg BP at end in supine: 135/58(79) mmHg   Recommendations for follow up therapy are one component of a multi-disciplinary discharge planning process, led by the attending physician.  Recommendations may be updated based on patient status, additional functional criteria and insurance authorization.  Follow Up Recommendations  Skilled nursing-short term rehab (<3 hours/day) Can patient physically be transported by private vehicle: Yes   Assistance Recommended at Discharge Frequent or constant Supervision/Assistance  Patient can return home with the following A lot of help with bathing/dressing/bathroom;Assistance with cooking/housework;Direct supervision/assist for medications management;Direct supervision/assist for financial management;Assist for transportation;Help with stairs or ramp for entrance;Two people to help with walking and/or transfers   Equipment  Recommendations  None recommended by PT    Recommendations for Other Services       Precautions / Restrictions Precautions Precautions: Fall Precaution Comments: watch orthostatics, pt reporting dizziness with standing, noted drop in BP, however pt moving arm during readings Restrictions Weight Bearing Restrictions: No     Mobility  Bed Mobility Overal bed mobility: Needs Assistance Bed Mobility: Supine to Sit, Sit to Supine     Supine to sit: HOB elevated, Min assist Sit to supine: Mod assist, HOB elevated   General bed mobility comments: assit for sequencing and BLE management    Transfers Overall transfer level: Needs assistance Equipment used: Rolling walker (2 wheels) Transfers: Sit to/from Stand Sit to Stand: Mod assist           General transfer comment: modA to stand from bed, cueing required for utilizing bed to push up from, ~5 sit<>stand trials performed    Ambulation/Gait             Pre-gait activities: side stepping along EOB, pt reporting dizziness with standing so 1 trial of side stepping performed, limited by fatigue from pt     Stairs             Wheelchair Mobility    Modified Rankin (Stroke Patients Only)       Balance Overall balance assessment: Needs assistance Sitting-balance support: Feet supported, No upper extremity supported Sitting balance-Leahy Scale: Fair Sitting balance - Comments: assist provided for safety   Standing balance support: During functional activity, Bilateral upper extremity supported, Reliant on assistive device for balance Standing balance-Leahy Scale: Poor Standing balance comment: Requires UE support and external support due to initial posterior lean  Cognition Arousal/Alertness: Awake/alert Behavior During Therapy: Impulsive Overall Cognitive Status: Impaired/Different from baseline Area of Impairment: Memory, Following commands, Safety/judgement,  Problem solving                     Memory: Decreased short-term memory Following Commands: Follows one step commands inconsistently, Follows one step commands with increased time Safety/Judgement: Decreased awareness of safety, Decreased awareness of deficits   Problem Solving: Slow processing, Decreased initiation, Difficulty sequencing, Requires verbal cues, Requires tactile cues General Comments: Pt continues to ask if therapist was at her house last night, attempted to reorient pt, requires increased cueing throughout        Exercises      General Comments General comments (skin integrity, edema, etc.): BP in sitting: 120/104, in standing 106/49, at end in supine 134/58      Pertinent Vitals/Pain Pain Assessment Pain Assessment: No/denies pain    Home Living                          Prior Function            PT Goals (current goals can now be found in the care plan section) Acute Rehab PT Goals Patient Stated Goal: none stated PT Goal Formulation: With patient/family Time For Goal Achievement: 03/15/22 Potential to Achieve Goals: Fair Progress towards PT goals: Progressing toward goals    Frequency    Min 3X/week      PT Plan Current plan remains appropriate    Co-evaluation              AM-PAC PT "6 Clicks" Mobility   Outcome Measure  Help needed turning from your back to your side while in a flat bed without using bedrails?: A Little Help needed moving from lying on your back to sitting on the side of a flat bed without using bedrails?: A Lot Help needed moving to and from a bed to a chair (including a wheelchair)?: A Lot Help needed standing up from a chair using your arms (e.g., wheelchair or bedside chair)?: A Lot Help needed to walk in hospital room?: A Lot Help needed climbing 3-5 steps with a railing? : Total 6 Click Score: 12    End of Session Equipment Utilized During Treatment: Gait belt Activity Tolerance: Patient  limited by fatigue Patient left: in bed;with call bell/phone within reach;with bed alarm set Nurse Communication: Mobility status;Other (comment) (BP) PT Visit Diagnosis: Unsteadiness on feet (R26.81);Other abnormalities of gait and mobility (R26.89);Muscle weakness (generalized) (M62.81);Difficulty in walking, not elsewhere classified (R26.2);Other symptoms and signs involving the nervous system (R29.898)     Time: 1030-1055 PT Time Calculation (min) (ACUTE ONLY): 25 min  Charges:  $Therapeutic Activity: 23-37 mins                     Charlynne Cousins, PT DPT Acute Rehabilitation Services Office [phone]    Luvenia Heller 03/07/2022, 1:53 PM

## 2022-03-08 ENCOUNTER — Telehealth: Payer: Self-pay

## 2022-03-08 NOTE — Telephone Encounter (Signed)
Transition Care Management Follow-up Telephone Call Date of discharge and from where: [facility] 03/07/2022 How have you been since you were released from the hospital? better Any questions or concerns? No  Items Reviewed: Did the pt receive and understand the discharge instructions provided? Yes  Medications obtained and verified? Yes  Other? No  Any new allergies since your discharge? No  Dietary orders reviewed? Yes Do you have support at home? Yes   Home Care and Equipment/Supplies: Were home health services ordered? no If so, what is the name of the agency? N/a  Has the agency set up a time to come to the patient's home? not applicable Were any new equipment or medical supplies ordered?  No What is the name of the medical supply agency? N/a Were you able to get the supplies/equipment? not applicable Do you have any questions related to the use of the equipment or supplies? No  Functional Questionnaire: (I = Independent and D = Dependent) ADLs: D  Bathing/Dressing- D  Meal Prep- D  Eating- I  Maintaining continence- D  Transferring/Ambulation- D  Managing Meds- D  Follow up appointments reviewed:  PCP Hospital f/u appt confirmed? No  no avail appt times. Sent message to staff to schedule Specialist Hospital f/u appt confirmed? No   Are transportation arrangements needed? No  If their condition worsens, is the pt aware to call PCP or go to the Emergency Dept.? Yes Was the patient provided with contact information for the PCP's office or ED? Yes Was to pt encouraged to call back with questions or concerns? Yes Juanda Crumble, LPN [place] Direct Dial [phone]

## 2022-03-12 ENCOUNTER — Telehealth: Payer: Self-pay | Admitting: Internal Medicine

## 2022-03-12 NOTE — Telephone Encounter (Signed)
-----  Message from Farrel Gordon, DO sent at 03/07/2022 10:04 AM EST ----- Regarding: HFU Good morning,  Can you please assist me in scheduling this patient for a HFU in the next ~1 week? She is discharging today.  Thanks! Raquel Sarna
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
## 2022-03-12 NOTE — Telephone Encounter (Signed)
Please refer to message below.  Contacted patient's daughter, Chelsea [name], to schedule a hospital follow up.  Due to her work schedule she can only bring the patient on Monday or Tuesday.  Appointment has been scheduled for 03/13/2022 at 8:45 am with Dr. Serita Butcher.

## 2022-03-13 ENCOUNTER — Other Ambulatory Visit: Payer: Self-pay

## 2022-03-13 ENCOUNTER — Encounter: Payer: Self-pay | Admitting: Student

## 2022-03-13 ENCOUNTER — Ambulatory Visit (INDEPENDENT_AMBULATORY_CARE_PROVIDER_SITE_OTHER): Payer: 59 | Admitting: Student

## 2022-03-13 ENCOUNTER — Ambulatory Visit (INDEPENDENT_AMBULATORY_CARE_PROVIDER_SITE_OTHER): Payer: 59 | Admitting: *Deleted

## 2022-03-13 VITALS — BP 134/62 | HR 80 | Ht 64.0 in | Wt 189.1 lb

## 2022-03-13 VITALS — BP 134/62 | HR 80 | Ht 64.0 in | Wt 189.2 lb

## 2022-03-13 DIAGNOSIS — G9341 Metabolic encephalopathy: Secondary | ICD-10-CM

## 2022-03-13 DIAGNOSIS — E1169 Type 2 diabetes mellitus with other specified complication: Secondary | ICD-10-CM

## 2022-03-13 DIAGNOSIS — I152 Hypertension secondary to endocrine disorders: Secondary | ICD-10-CM | POA: Diagnosis not present

## 2022-03-13 DIAGNOSIS — E113593 Type 2 diabetes mellitus with proliferative diabetic retinopathy without macular edema, bilateral: Secondary | ICD-10-CM

## 2022-03-13 DIAGNOSIS — E785 Hyperlipidemia, unspecified: Secondary | ICD-10-CM

## 2022-03-13 DIAGNOSIS — Z0000 Encounter for general adult medical examination without abnormal findings: Secondary | ICD-10-CM | POA: Diagnosis not present

## 2022-03-13 DIAGNOSIS — E1159 Type 2 diabetes mellitus with other circulatory complications: Secondary | ICD-10-CM | POA: Diagnosis not present

## 2022-03-13 DIAGNOSIS — N1832 Chronic kidney disease, stage 3b: Secondary | ICD-10-CM | POA: Diagnosis not present

## 2022-03-13 DIAGNOSIS — Z794 Long term (current) use of insulin: Secondary | ICD-10-CM

## 2022-03-13 DIAGNOSIS — E1122 Type 2 diabetes mellitus with diabetic chronic kidney disease: Secondary | ICD-10-CM

## 2022-03-13 LAB — BASIC METABOLIC PANEL
Anion gap: 12 (ref 5–15)
BUN: 47 mg/dL — ABNORMAL HIGH (ref 8–23)
CO2: 23 mmol/L (ref 22–32)
Calcium: 8.5 mg/dL — ABNORMAL LOW (ref 8.9–10.3)
Chloride: 100 mmol/L (ref 98–111)
Creatinine, Ser: 1.49 mg/dL — ABNORMAL HIGH (ref 0.44–1.00)
GFR, Estimated: 37 mL/min — ABNORMAL LOW (ref >60)
Glucose, Bld: 223 mg/dL — ABNORMAL HIGH (ref 70–99)
Potassium: 4.3 mmol/L (ref 3.5–5.1)
Sodium: 135 mmol/L (ref 135–145)

## 2022-03-13 MED ORDER — CHLORTHALIDONE 25 MG PO TABS: 25.0000 mg | ORAL_TABLET | Freq: Every day | ORAL | 2 refills | Status: DC

## 2022-03-13 NOTE — Assessment & Plan Note (Signed)
Patient was hospitalized thirteen days ago for acute encephalopathy secondary to cocaine use and then discharged on 1-24 after return to near-baseline cognition. Since returning home, her daughter reports an improvement in speech specifically less slurring and independence around the home. At baseline, she does require some assistance with several ADLs. Her daughter further reports that family and household members have been monitoring her more closely to prevent further drug use. Since leaving the hospital, the patient has used neither cocaine nor alcohol. Additionally, she has been eating and drinking okay without any difficulty. Per daughter, the patient is either at her pre-hospitalization baseline or slightly above it. On exam, patient was oriented to person-place-time and able to spell "world" backwards, but failed to list days of the week in reverse.  - Continue to abstain from cocaine and alcohol use

## 2022-03-13 NOTE — Assessment & Plan Note (Signed)
Patient has history of diabetes managed with liraglutide. Most recent A1C collected 01-2022 was 7.0. At that time, her liraglutide dose was increased from 1.'2mg'$  to 1.'8mg'$  daily.  She reports daily adherence to this medication.  - Continue liraglutide 1.'8mg'$  q24 - Repeat A1C at next visit in about two months

## 2022-03-13 NOTE — Patient Instructions (Signed)
  Thank you, Ms.[name], for allowing [place] to provide your care today. Today we discussed . . .  > Hypertension       - your blood pressure looks good today, so you can continue to take your three medications as needed       - we will call you with the results of your laboratory work and let you know if we need to make any changes > Diabetes       - continue to take your liraglutide daily > Hyperlipidemia       - continue to take your atorvastatin and ezetimibe daily   I have ordered the following labs for you:   Lab Orders         BMP8+Anion Gap       Tests ordered today:  none   Referrals ordered today:   Referral Orders  No referral(s) requested today      I have ordered the following medication/changed the following medications:   Stop the following medications: There are no discontinued medications.   Start the following medications: No orders of the defined types were placed in this encounter.     Follow up: 2 months    Remember:  Continue to take all of your current medications and we will call you with any changes if they are necessary. We will see you again in about 2 months!   Should you have any questions or concerns please call the internal medicine clinic at [phone].     Roswell Nickel, MD [place]

## 2022-03-13 NOTE — Assessment & Plan Note (Addendum)
Patient has a history of hypertension managed with carvedilol, amlodipine, and chlorthalidone. She has been intolerant to ACE inhibitors, ARBs, and spironolactone in the past. During her recent hospitalization, chlorthalidone was switched to hydrochlorothiazide, which was subsequently discontinued altogether due to acute kidney injury. She was discharged with carvedilol and amlodipine only, but has been taking chlorthalidone in addition to these medications since returning home. Her BP in clinic today was 134/62. Creatinine, GFR, and electrolytes had all returned to baseline. Given these reassuring laboratory testing and BP measurement, we will maintain her current antihypertensive regimen consisting of carvedilol, amlodipine, and chlorthalidone. Close follow-up is warranted given recent fluctuations in her renal function and electrolytes.  - Continue  carvedilol '25mg'$  q12, amlodipine '10mg'$  q24, and chlorthalidone '25mg'$  q24 - Repeat BMP in two months to monitor Cr, GFR, and electrolytes

## 2022-03-13 NOTE — Assessment & Plan Note (Signed)
Patient has history of CKD IIIb with baseline creatinine in the 1.3-1.6 range, GFR 37-42. On day of discharge, 1-24, BMP was notable for Cr 1.9 and GFR 28. Exam in clinic today was negative for overt signs of dehydration and patient reports drinking fluids at home. Repeat BMP revealed Cr 1.49 and GFR 37, which are well-within her baseline and consistent with CKD IIIb.  - Repeat BMP in two months to monitor Cr, GFR, and electrolytes

## 2022-03-13 NOTE — Patient Instructions (Signed)

## 2022-03-13 NOTE — Assessment & Plan Note (Signed)
Patient has history of hyperlipidemia with LDL goal <70 given prior CVA. Managed with atorvastatin '80mg'$  and ezetimibe was added at last [facility] visit for better LDL control. She reports daily adherence to both of these medications.  - Continue atorvastatin '80mg'$  q24 and ezetimibe '10mg'$  q24 - Repeat lipid panel at next visit in about two months
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
## 2022-03-14 ENCOUNTER — Other Ambulatory Visit: Payer: Self-pay | Admitting: Internal Medicine

## 2022-03-14 DIAGNOSIS — I152 Hypertension secondary to endocrine disorders: Secondary | ICD-10-CM

## 2022-03-19 ENCOUNTER — Telehealth: Payer: Self-pay

## 2022-03-19 NOTE — Telephone Encounter (Signed)
DECISION :     Outcome   Approved today   Request Reference Number: [id number]. VICTOZA INJ '18MG'$ /3ML is approved through 02/12/2023.     Your patient may now fill this prescription and it will be covered.   Authorization Expiration Date: 02/12/2023   Drug Victoza '18MG'$ F[employer] Mediate pen-injectors   ePA cloud logo Form [employer] Medicare Part D Electronic Prior Authorization Form (2017 NCPDP)      ( COPY SENT TO PHARMACY  AND PLACED TO SCAN TO CHART ALSO )

## 2022-03-19 NOTE — Telephone Encounter (Signed)
PA for pt [facility] ) came through on cover my meds was submitted with last office notes and labs .[name] KitchenMarland KitchenAwaiting approval or denial

## 2022-03-22 NOTE — Progress Notes (Signed)
I reviewed the AWV findings with the provider who conducted the visit. I was present in the office suite and immediately available to provide assistance and direction throughout the time the service was provided.  

## 2022-03-27 ENCOUNTER — Telehealth: Payer: Self-pay | Admitting: *Deleted

## 2022-03-27 NOTE — Telephone Encounter (Signed)
Received call from [place] at [facility]. States they are getting Pill Packs ready to send out and patient notified them that Rx for carvedilol has changed from 12.5 mg BID to 25 mg BID. Andee Poles is requesting new Rx be sent to them.

## 2022-03-28 ENCOUNTER — Other Ambulatory Visit: Payer: Self-pay | Admitting: Internal Medicine

## 2022-03-28 MED ORDER — CARVEDILOL 25 MG PO TABS: 25.0000 mg | ORAL_TABLET | Freq: Two times a day (BID) | ORAL | 2 refills | Status: DC

## 2022-04-02 NOTE — Progress Notes (Signed)
Internal Medicine Clinic Attending  Case discussed with Dr. Jodi Mourning  At the time of the visit.  We reviewed the resident's history and exam and pertinent patient test results.  I agree with the assessment, diagnosis, and plan of care documented in the resident's note.

## 2022-04-19 ENCOUNTER — Encounter: Payer: Self-pay | Admitting: Internal Medicine

## 2022-04-19 ENCOUNTER — Telehealth: Payer: Self-pay | Admitting: Internal Medicine

## 2022-04-19 NOTE — Telephone Encounter (Signed)
Opened in error

## 2022-04-24 ENCOUNTER — Other Ambulatory Visit: Payer: Self-pay | Admitting: Internal Medicine

## 2022-04-24 DIAGNOSIS — E113593 Type 2 diabetes mellitus with proliferative diabetic retinopathy without macular edema, bilateral: Secondary | ICD-10-CM

## 2022-05-28 ENCOUNTER — Other Ambulatory Visit: Payer: Self-pay | Admitting: Internal Medicine

## 2022-05-28 DIAGNOSIS — Z8673 Personal history of transient ischemic attack (TIA), and cerebral infarction without residual deficits: Secondary | ICD-10-CM

## 2022-07-18 ENCOUNTER — Other Ambulatory Visit: Payer: Self-pay | Admitting: Internal Medicine

## 2022-07-18 DIAGNOSIS — E113593 Type 2 diabetes mellitus with proliferative diabetic retinopathy without macular edema, bilateral: Secondary | ICD-10-CM

## 2022-07-27 ENCOUNTER — Other Ambulatory Visit: Payer: Self-pay | Admitting: Internal Medicine

## 2022-07-27 DIAGNOSIS — Z0000 Encounter for general adult medical examination without abnormal findings: Secondary | ICD-10-CM

## 2022-08-14 ENCOUNTER — Ambulatory Visit
Admission: RE | Admit: 2022-08-14 | Discharge: 2022-08-14 | Disposition: A | Discharge status: Home / Self Care | Payer: 59 | Source: Ambulatory Visit

## 2022-08-14 DIAGNOSIS — Z0000 Encounter for general adult medical examination without abnormal findings: Secondary | ICD-10-CM

## 2022-08-20 ENCOUNTER — Other Ambulatory Visit: Payer: Self-pay | Admitting: Internal Medicine

## 2022-08-20 DIAGNOSIS — Z794 Long term (current) use of insulin: Secondary | ICD-10-CM

## 2022-08-21 ENCOUNTER — Other Ambulatory Visit: Payer: Self-pay | Admitting: Internal Medicine

## 2022-09-17 ENCOUNTER — Other Ambulatory Visit: Payer: Self-pay

## 2022-09-17 IMAGING — US US RENAL
1 series · 14 of 25 positions shown · non-contrast
Comparison: 10/21/2006 ultrasound

CLINICAL DATA: 70-year-old female with acute kidney injury.

EXAM:
RENAL / URINARY TRACT ULTRASOUND COMPLETE

[Series 1: us renal · 85 acquisitions, 14 frames shown]
[im 1/85]
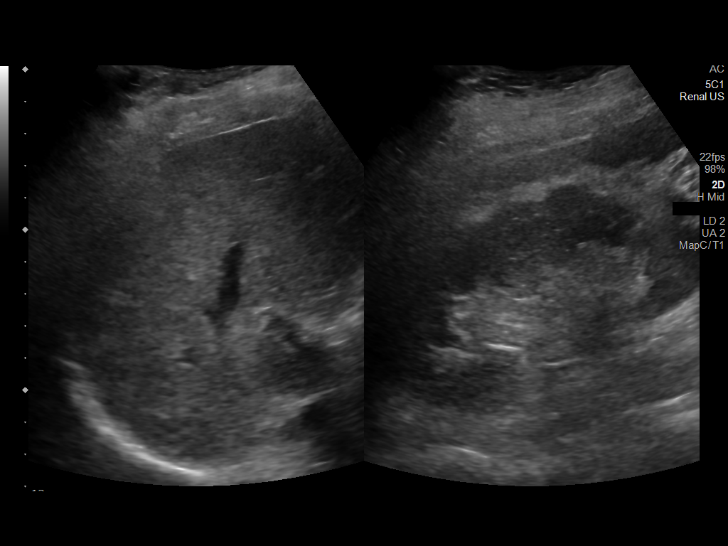
[im 8/85]
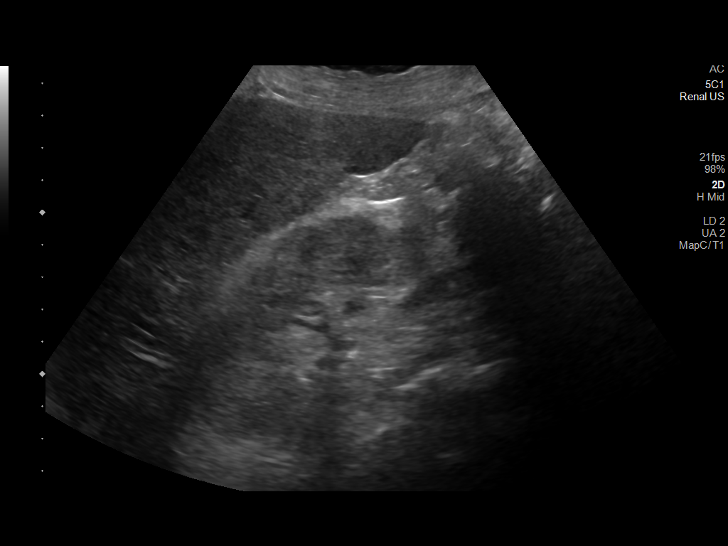
[im 15/85]
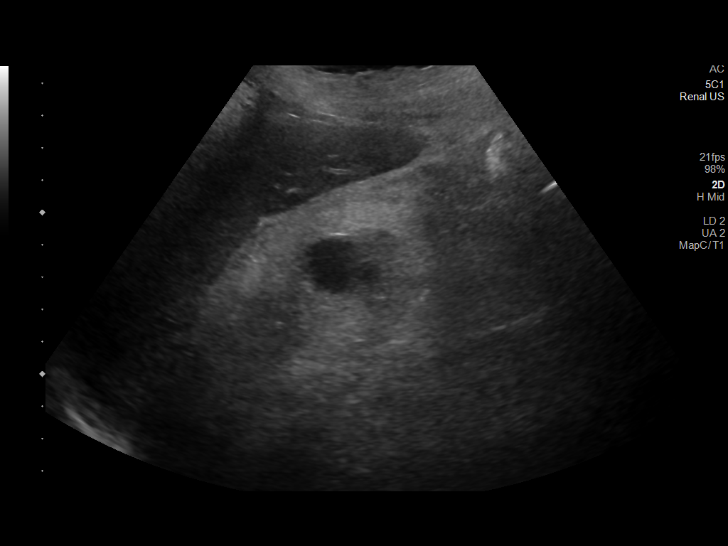
[im 22/85]
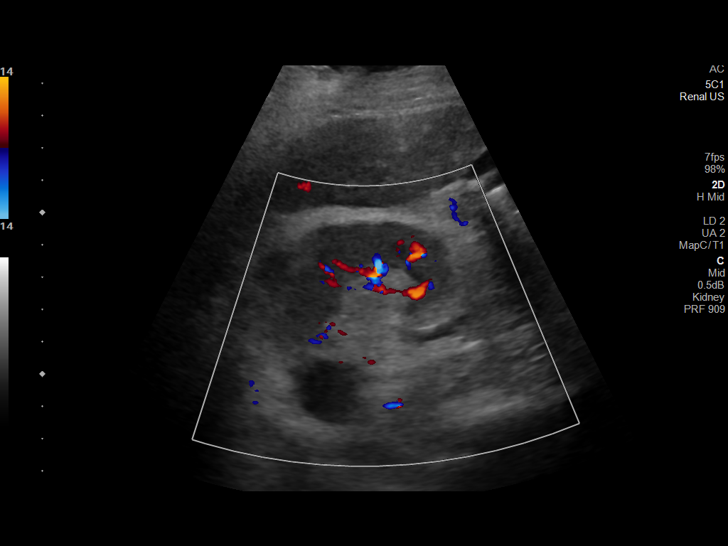
[im 29/85]
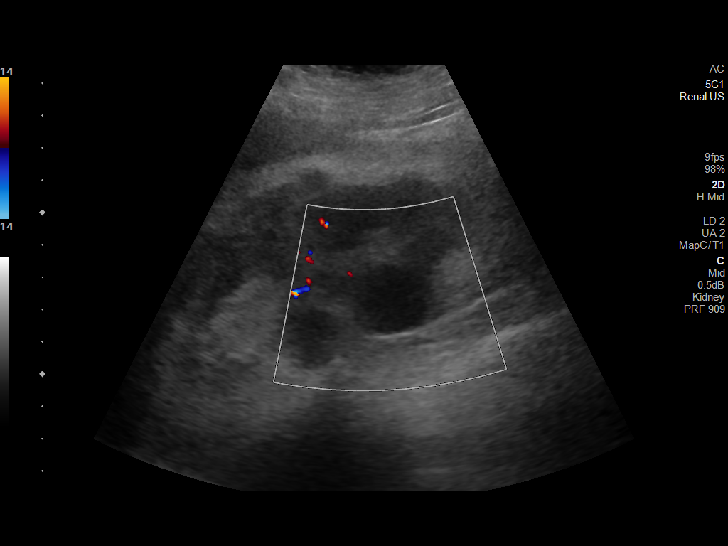
[im 32/85]
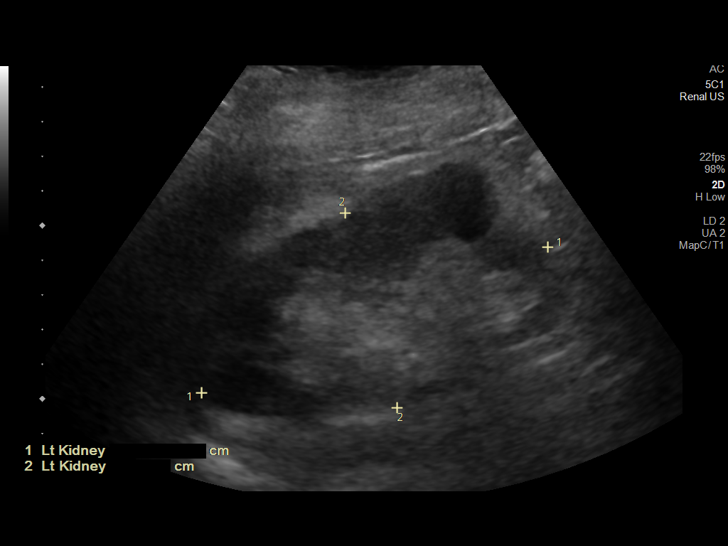
[im 39/85]
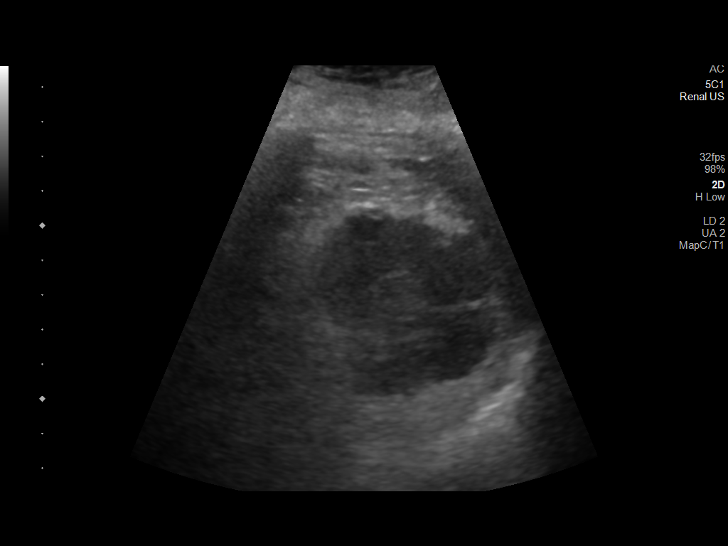
[im 46/85]
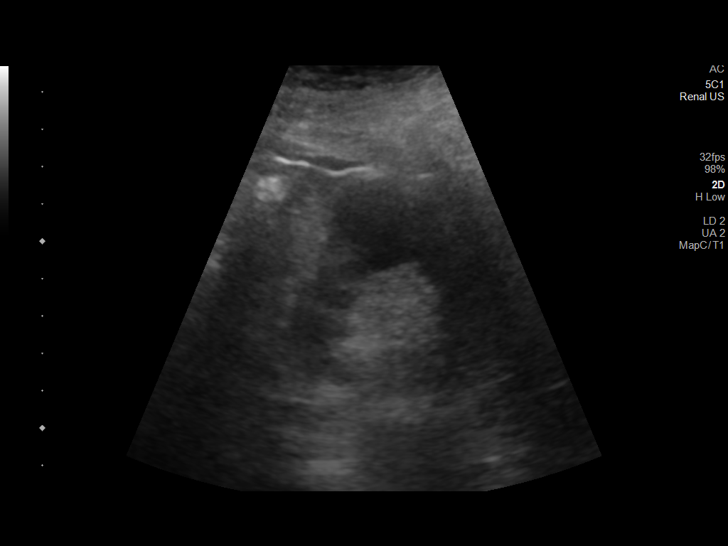
[im 53/85]
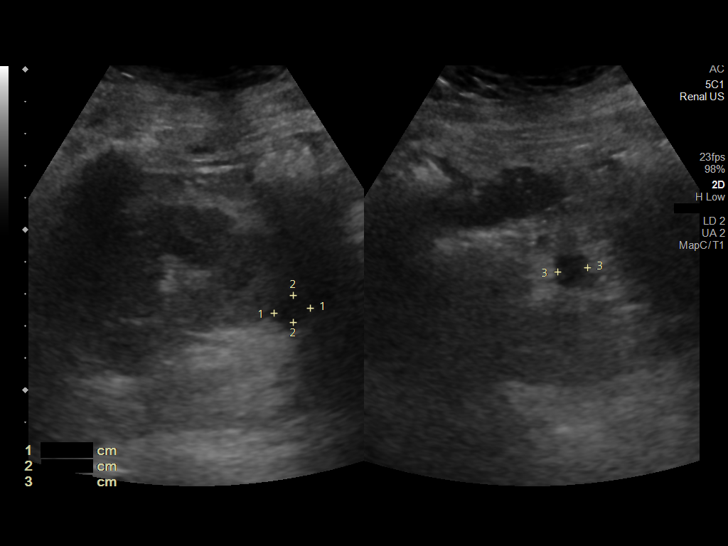
[im 57/85]
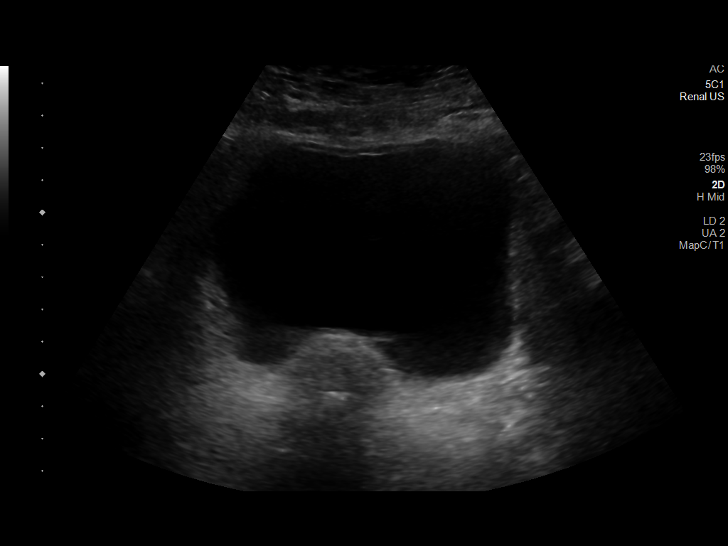
[im 64/85]
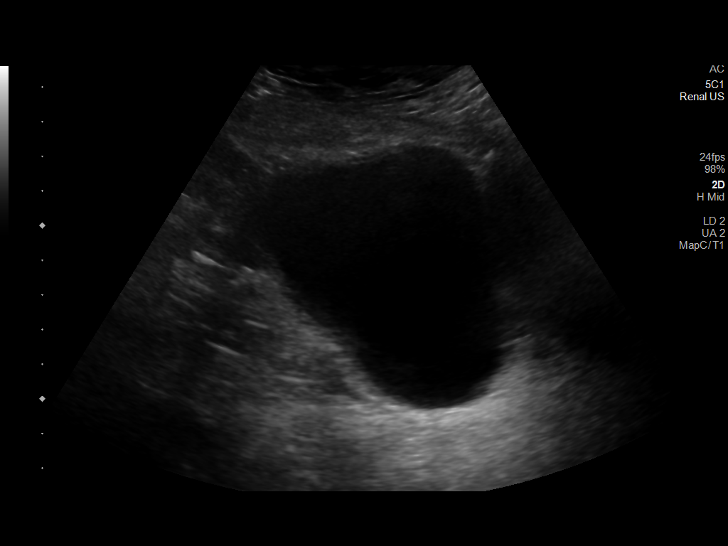
[im 71/85]
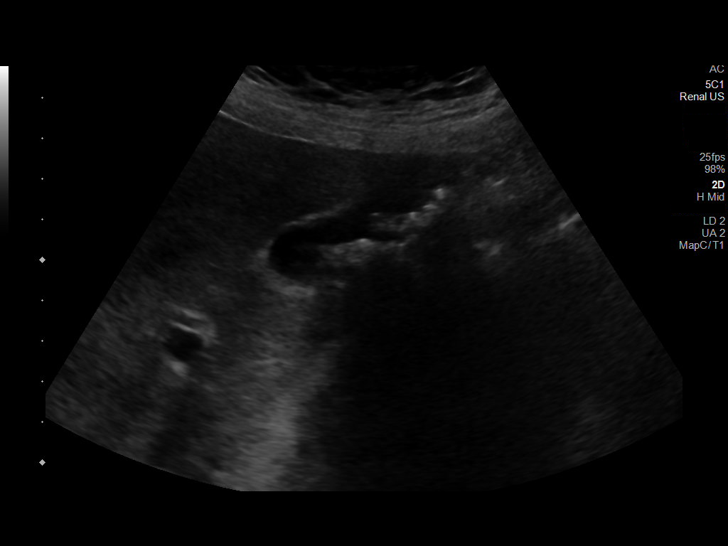
[im 78/85]
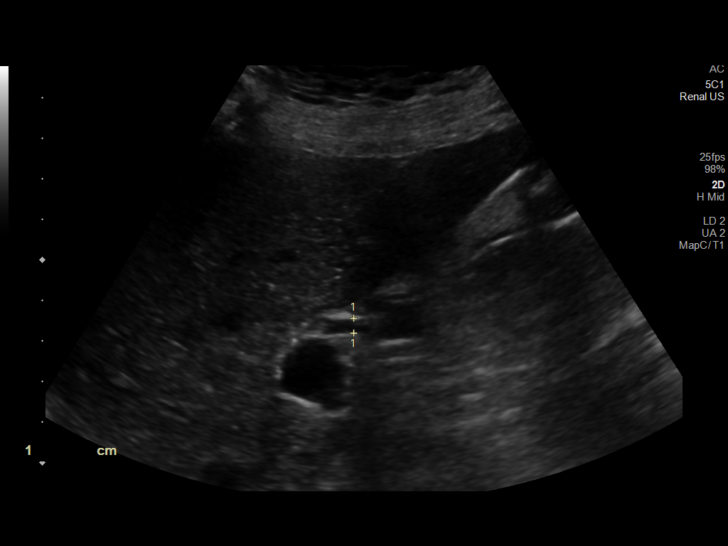
[im 85/85]
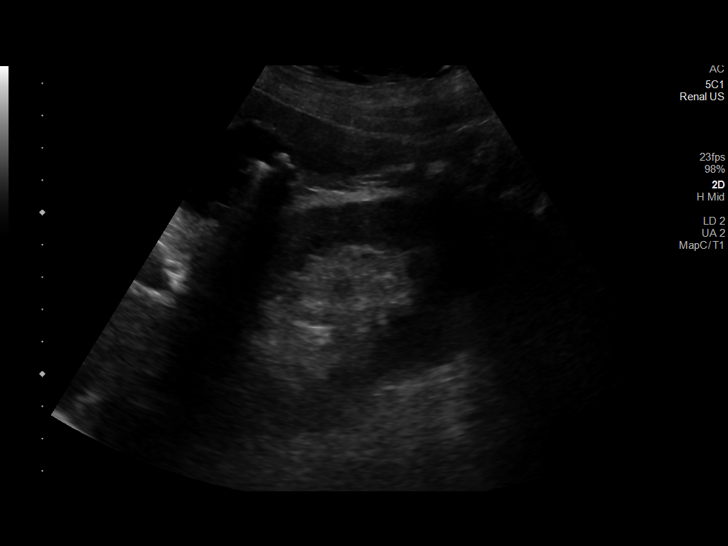

[14 of 25 positions shown; findings below may reference images not displayed]

FINDINGS: Right Kidney:

Renal measurements: 9.7 x 4.7 x 4.7 = volume: 113 mL. Renal
echogenicity is slightly increased. Multiple cysts are identified
(less than 10), the largest measuring 2.4 cm within the mid kidney.
No solid mass or hydronephrosis visualized.

Left Kidney:

Renal measurements: 10.8 x 5.8 x 5.2 cm = volume: 170 mL. Renal
echogenicity is slightly increased. Multiple cysts are present (less
than 10), the largest measuring 2.8 cm within the mid kidney. No
solid mass or hydronephrosis visualized.

Bladder:

Appears normal for degree of bladder distention.

Other:

Incidental note is made of multiple mobile gallstones without
sonographic evidence of acute cholecystitis.
IMPRESSION: 1. Slightly increased renal echogenicity bilaterally compatible with
medical renal disease. No evidence of hydronephrosis.
2. Multiple bilateral renal cysts without evidence of solid renal
mass.
3. Cholelithiasis.

## 2022-09-17 MED ORDER — CHLORTHALIDONE 25 MG PO TABS: 25.0000 mg | ORAL_TABLET | Freq: Every day | ORAL | 2 refills | Status: DC

## 2022-09-24 ENCOUNTER — Ambulatory Visit (INDEPENDENT_AMBULATORY_CARE_PROVIDER_SITE_OTHER): Payer: 59 | Admitting: Internal Medicine

## 2022-09-24 ENCOUNTER — Encounter: Payer: Self-pay | Admitting: Internal Medicine

## 2022-09-24 ENCOUNTER — Other Ambulatory Visit: Payer: Self-pay

## 2022-09-24 VITALS — BP 130/76 | HR 78 | Ht 65.0 in

## 2022-09-24 DIAGNOSIS — E1159 Type 2 diabetes mellitus with other circulatory complications: Secondary | ICD-10-CM | POA: Diagnosis not present

## 2022-09-24 DIAGNOSIS — Z794 Long term (current) use of insulin: Secondary | ICD-10-CM | POA: Diagnosis not present

## 2022-09-24 DIAGNOSIS — Z8673 Personal history of transient ischemic attack (TIA), and cerebral infarction without residual deficits: Secondary | ICD-10-CM

## 2022-09-24 DIAGNOSIS — R5381 Other malaise: Secondary | ICD-10-CM

## 2022-09-24 DIAGNOSIS — N1832 Chronic kidney disease, stage 3b: Secondary | ICD-10-CM

## 2022-09-24 DIAGNOSIS — I152 Hypertension secondary to endocrine disorders: Secondary | ICD-10-CM | POA: Diagnosis not present

## 2022-09-24 DIAGNOSIS — Z7984 Long term (current) use of oral hypoglycemic drugs: Secondary | ICD-10-CM

## 2022-09-24 DIAGNOSIS — E113593 Type 2 diabetes mellitus with proliferative diabetic retinopathy without macular edema, bilateral: Secondary | ICD-10-CM | POA: Diagnosis not present

## 2022-09-24 DIAGNOSIS — I129 Hypertensive chronic kidney disease with stage 1 through stage 4 chronic kidney disease, or unspecified chronic kidney disease: Secondary | ICD-10-CM

## 2022-09-24 DIAGNOSIS — N183 Chronic kidney disease, stage 3 unspecified: Secondary | ICD-10-CM

## 2022-09-24 DIAGNOSIS — D638 Anemia in other chronic diseases classified elsewhere: Secondary | ICD-10-CM | POA: Diagnosis not present

## 2022-09-24 DIAGNOSIS — R32 Unspecified urinary incontinence: Secondary | ICD-10-CM

## 2022-09-24 DIAGNOSIS — Z0000 Encounter for general adult medical examination without abnormal findings: Secondary | ICD-10-CM

## 2022-09-24 LAB — POCT GLYCOSYLATED HEMOGLOBIN (HGB A1C): Hemoglobin A1C: 6.3 % — AB (ref 4.0–5.6)

## 2022-09-24 LAB — GLUCOSE, CAPILLARY: Glucose-Capillary: 119 mg/dL — ABNORMAL HIGH (ref 70–99)

## 2022-09-24 MED ORDER — ATORVASTATIN CALCIUM 80 MG PO TABS: 80.0000 mg | ORAL_TABLET | Freq: Every day | ORAL | 3 refills | Status: DC

## 2022-09-24 MED ORDER — PEN NEEDLES 32G X 4 MM MISC: 1.0000 | Freq: Every day | 2 refills | Status: DC

## 2022-09-24 MED ORDER — ACCU-CHEK GUIDE VI STRP: ORAL_STRIP | 3 refills | Status: DC

## 2022-09-24 NOTE — Assessment & Plan Note (Signed)
Last check of hemoglobin 02/2022 was 8.9, down from prior values that were closer to normal. Plan:CBC today.

## 2022-09-24 NOTE — Assessment & Plan Note (Signed)
OP regimen includes atorvastatin 80 mg daily, zetia 10 mg daily, ASA 81 mg daily. Last lipid panel 08/2021 with LDL above goal at 82. On review of her pill packet, I noticed that atorvastatin was not packaged nor is she receiving a separate bottle for this. Chart review is without a specific reason that this was discontinued and I believe it may have fallen off of her medication list at hospital discharge in January. Plan: Continue zetia 10 mg daily, ASA 81 mg daily. Atorvastatin 80 mg daily resent. Lipid panel at next OV.

## 2022-09-24 NOTE — Assessment & Plan Note (Signed)
Patient declines Tdap, pneumonia vaccines, information regarding shingles vaccines.

## 2022-09-24 NOTE — Assessment & Plan Note (Signed)
Requesting referral to physical therapy for ongoing assistance with physical deconditioning and improving her strength. Plan:Referral placed.

## 2022-09-24 NOTE — Assessment & Plan Note (Addendum)
HbA1c 01/2022 7.0%, today improved to 6.3%. OP regimen is victoza 1.8 mg daily which she is compliant with. Plan:Unable to collect urine microalbumin/creatinine ratio at today's OV; will plan to collect at next OV. Continue victoza 1.8 mg daily.
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
## 2022-09-24 NOTE — Assessment & Plan Note (Addendum)
Last BMP with stable renal function, creatinine 1.49 and GFR 37. She is prescribed sodium bicarbonate 650 mg TID.  Plan:BMP today.

## 2022-09-24 NOTE — Patient Instructions (Signed)
[name],  It was great to see you today. I'm glad you are doing so well!  I will let you know what your lab results show once they have all resulted.  I will make sure that you have refills of your medications as well.  We will see you in about 3 months for check up, or sooner if needed.  My best, Dr. August Saucer

## 2022-09-24 NOTE — Assessment & Plan Note (Signed)
BP today 152/63, on recheck 130/76. OP regimen is amlodipine 10 mg daily, carvedilol 25 mg BID which she is compliant with; she is also prescribed chlorthalidone 25 mg daily however this medication is not in her pill packet, rather its own bottle, and she did not know its purpose so she has not been taking it. She did take her BP medicine this morning. Plan:Continue amlodipine 10 mg daily, carvedilol 25 mg BID. Will not have her take chlorthalidone at this time given acceptable BP recheck while not taking the medication.

## 2022-09-24 NOTE — Assessment & Plan Note (Addendum)
History or urinary incontinence due to inability to make it to the restroom in time. She has incontinence supplies and is requesting a refill of these today. Plan:Referral for DME for incontinence supplies placed.

## 2022-09-25 NOTE — Addendum Note (Signed)
Addended by: Dickie La on: 09/25/2022 08:13 AM   Modules accepted: Level of Service

## 2022-09-25 NOTE — Progress Notes (Signed)
Internal Medicine Clinic Attending  Case discussed with the resident at the time of the visit.  We reviewed the resident's history and exam and pertinent patient test results.  I agree with the assessment, diagnosis, and plan of care documented in the resident's note.  

## 2022-10-01 NOTE — Addendum Note (Signed)
Addended by: Ihor Dow on: 10/01/2022 10:24 AM   Modules accepted: Orders

## 2022-10-02 ENCOUNTER — Other Ambulatory Visit: Payer: Self-pay | Admitting: Internal Medicine

## 2022-10-02 DIAGNOSIS — G4700 Insomnia, unspecified: Secondary | ICD-10-CM

## 2022-10-09 ENCOUNTER — Ambulatory Visit: Payer: 59

## 2022-10-10 ENCOUNTER — Other Ambulatory Visit: Payer: Self-pay | Admitting: Internal Medicine

## 2022-10-10 ENCOUNTER — Other Ambulatory Visit: Payer: Self-pay

## 2022-10-10 ENCOUNTER — Encounter (HOSPITAL_COMMUNITY): Payer: Self-pay

## 2022-10-10 ENCOUNTER — Emergency Department (HOSPITAL_COMMUNITY)
Admission: EM | Admit: 2022-10-10 | Discharge: 2022-10-11 | Disposition: A | Discharge status: Home / Self Care | Payer: 59 | Attending: Emergency Medicine | Admitting: Emergency Medicine

## 2022-10-10 DIAGNOSIS — N189 Chronic kidney disease, unspecified: Secondary | ICD-10-CM | POA: Insufficient documentation

## 2022-10-10 DIAGNOSIS — R600 Localized edema: Secondary | ICD-10-CM | POA: Diagnosis not present

## 2022-10-10 DIAGNOSIS — Z794 Long term (current) use of insulin: Secondary | ICD-10-CM | POA: Insufficient documentation

## 2022-10-10 DIAGNOSIS — R2243 Localized swelling, mass and lump, lower limb, bilateral: Secondary | ICD-10-CM | POA: Diagnosis present

## 2022-10-10 DIAGNOSIS — Z7982 Long term (current) use of aspirin: Secondary | ICD-10-CM | POA: Insufficient documentation

## 2022-10-10 LAB — BASIC METABOLIC PANEL
Anion gap: 9 (ref 5–15)
BUN: 35 mg/dL — ABNORMAL HIGH (ref 8–23)
CO2: 23 mmol/L (ref 22–32)
Calcium: 8.3 mg/dL — ABNORMAL LOW (ref 8.9–10.3)
Chloride: 107 mmol/L (ref 98–111)
Creatinine, Ser: 1.79 mg/dL — ABNORMAL HIGH (ref 0.44–1.00)
GFR, Estimated: 30 mL/min — ABNORMAL LOW (ref >60)
Glucose, Bld: 163 mg/dL — ABNORMAL HIGH (ref 70–99)
Potassium: 4.1 mmol/L (ref 3.5–5.1)
Sodium: 139 mmol/L (ref 135–145)

## 2022-10-10 LAB — CBC
HCT: 31.4 % — ABNORMAL LOW (ref 36.0–46.0)
Hemoglobin: 9.7 g/dL — ABNORMAL LOW (ref 12.0–15.0)
MCH: 26.6 pg (ref 26.0–34.0)
MCHC: 30.9 g/dL (ref 30.0–36.0)
MCV: 86 fL (ref 80.0–100.0)
Platelets: 240 10*3/uL (ref 150–400)
RBC: 3.65 MIL/uL — ABNORMAL LOW (ref 3.87–5.11)
RDW: 16.6 % — ABNORMAL HIGH (ref 11.5–15.5)
WBC: 6.5 10*3/uL (ref 4.0–10.5)
nRBC: 0 % (ref 0.0–0.2)

## 2022-10-10 LAB — BRAIN NATRIURETIC PEPTIDE: B Natriuretic Peptide: 31.5 pg/mL (ref 0.0–100.0)

## 2022-10-10 NOTE — ED Triage Notes (Addendum)
PER EMS:pt is from home with c/o swelling and pain to both feet that she noticed yesterday. No hx of swelling to her lower extremities. She states the swelling started in her feet yesterday but today she noticed the swelling is moving up her legs. The pts daughter told EMS the patient had a recent referral to a kidney specialist. Pt denies chest pain or shortness of breath.  BP- 148/72, HR-70, O2-99% RA, CBG-169

## 2022-10-10 NOTE — ED Provider Triage Note (Signed)
Emergency Medicine Provider Triage Evaluation Note  TOBIN POZO , a 72 y.o. female  was evaluated in triage.  Pt complains of bilateral lower leg edema that began today.  Patient states she has history of kidney disease but denies any heart failure, chest pain, shortness of breath.  Patient states she still able to move her lower extremities but denies any skin color changes or fevers.  Review of Systems  Positive: See hpi Negative: See hpi  Physical Exam  BP (!) 163/61 (BP Location: Right Arm)   Pulse 72   Temp 97.9 F (36.6 C) (Oral)   Resp 18   Ht 5\' 7"  (1.702 m)   Wt 95.3 kg   SpO2 98%   BMI 32.89 kg/m  Gen:   Awake, no distress   Resp:  Normal effort  MSK:   Moves extremities without difficulty  Other:  Left lower extremity appears more edematous than the right with 1+ pitting edema, no calf tenderness noted, 2+ bilateral dorsalis pedal pulses with regular rate, sensation intact distally, able to fully range ankles and toes  Medical Decision Making  Medically screening exam initiated at 10:57 PM.  Appropriate orders placed.  IXAYANA DRANSFIELD was informed that the remainder of the evaluation will be completed by another provider, this initial triage assessment does not replace that evaluation, and the importance of remaining in the ED until their evaluation is complete.  Workup initiated, patient stable at this time   Remi Deter 10/10/22 2259

## 2022-10-11 ENCOUNTER — Other Ambulatory Visit: Payer: Self-pay | Admitting: Student

## 2022-10-11 ENCOUNTER — Encounter (HOSPITAL_COMMUNITY): Payer: 59

## 2022-10-11 ENCOUNTER — Telehealth: Payer: Self-pay | Admitting: *Deleted

## 2022-10-11 MED ORDER — HYDROCHLOROTHIAZIDE 12.5 MG PO TABS: 12.5000 mg | ORAL_TABLET | Freq: Every day | ORAL | 3 refills | Status: DC

## 2022-10-11 MED ORDER — HYDROCHLOROTHIAZIDE 25 MG PO TABS: 25.0000 mg | ORAL_TABLET | Freq: Every day | ORAL | 1 refills | Status: DC

## 2022-10-11 NOTE — Progress Notes (Signed)
Received message about patient not being able to pick up her hydrochlorothiazide that was prescribed by the ED.  Per chart review, she was having some lower extremity swelling and her amlodipine was stopped.  Her chlorothiazide prescription was printed and was never given to the patient.  Also per chart review, seems like she was on chlorthalidone at some point but was not taking it.  Will start her on hydrochlorothiazide as her blood pressure was elevated on last visit, and amlodipine is now stopped.  Will also reach out to front desk to schedule her follow-up appointment in 2 weeks.

## 2022-10-11 NOTE — Telephone Encounter (Signed)
Called pt to schedule an appt - talked to pt's daughter,Tara. Call transferred to front office to schedule the appt. Appt schedle on 9/17 with Dr Nooruddin.

## 2022-10-11 NOTE — Telephone Encounter (Signed)
Thanks Glenda! I've sent in a prescription and talked with her, could we schedule her for a follow up appointment in about two weeks? Thank you!

## 2022-10-11 NOTE — ED Provider Notes (Signed)
[facility] AT [facility] Provider Note   CSN: [id number] Arrival date & time: 10/10/22  2222     History  Chief Complaint  Patient presents with   Leg Swelling    Chelsea [name] is a 72 y.o. female.  72 year old female history of hypertension diabetes presents the ER today secondary to lower extremity swelling.  Patient states that she is had this in the past but it seemed to gotten better.  Over the last couple days she had it again and then got worse tonight so she comes here for further evaluation.  No injuries.  No chest pain or shortness of breath.  She has been chronic kidney disease but still makes decent urine.  She states she has a lot of salt in her diet and thinks that maybe she should slow that down.  She is never tried compression stockings.  She states she is get an appointment with her podiatrist soon.        Home Medications Prior to Admission medications   Medication Sig Start Date End Date Taking? Authorizing Provider  hydrochlorothiazide (HYDRODIURIL) 25 MG tablet Take 1 tablet (25 mg total) by mouth daily. 10/11/22  Yes Purcell Jungbluth, Barbara Cower, MD  albuterol (VENTOLIN HFA) 108 (90 Base) MCG/ACT inhaler INHALE 2 PUFFS into lungs EVERY 6 HOURS AS NEEDED FOR WHEEZING OR SHORTNESS OF BREATH 02/14/21   Christian, Rylee, MD  ASPIRIN LOW DOSE 81 MG tablet TAKE 1 TABLET BY MOUTH EVERY DAY 05/30/22   Champ Mungo, DO  atorvastatin (LIPITOR) 80 MG tablet Take 1 tablet (80 mg total) by mouth daily. 09/24/22   Champ Mungo, DO  carvedilol (COREG) 25 MG tablet TAKE 1 TABLET BY MOUTH 2 TIMES DAILY WITH MEALS 08/21/22   Champ Mungo, DO  ezetimibe (ZETIA) 10 MG tablet Take 1 tablet (10 mg total) by mouth daily. Patient taking differently: Take 10 mg by mouth in the morning. 01/17/22 01/17/23  Champ Mungo, DO  fluticasone (FLONASE) 50 MCG/ACT nasal spray USE 2 SPRAYS IN [facility] NOSTRIL EVERY DAY 12/18/21   Champ Mungo, DO  glucose blood (ACCU-CHEK GUIDE) test strip Use  to check blood sugar two times per day. 09/24/22   Champ Mungo, DO  Insulin Pen Needle (PEN NEEDLES) 32G X 4 MM MISC 1 each by Does not apply route daily in the afternoon. Use to inject victoza once daily. 09/24/22   Champ Mungo, DO  sodium bicarbonate 650 MG tablet TAKE 1 TABLET BY MOUTH 3 TIMES DAILY 10/10/22   Champ Mungo, DO  traZODone (DESYREL) 50 MG tablet TAKE 1/2 TABLET TO 1 BY MOUTH AT BEDTIME 10/02/22   Champ Mungo, DO  VICTOZA 18 MG/3ML SOPN Inject 1.8 mg into the skin daily. 08/21/22   Champ Mungo, DO  Lancet Device MISC 1 each by Does not apply route 3 (three) times daily. 04/23/11 04/05/14  Dede Query, MD      Allergies    Ace inhibitors, Aldactone [spironolactone], Angiotensin receptor blockers, Atarax [hydroxyzine], Nsaids, Penicillins, and Zestril [lisinopril]    Review of Systems   Review of Systems  Physical Exam Updated Vital Signs BP (!) 152/82 (BP Location: Right Arm)   Pulse 81   Temp 97.6 F (36.4 C) (Oral)   Resp 16   Ht 5\' 7"  (1.702 m)   Wt 95.3 kg   SpO2 100%   BMI 32.89 kg/m  Physical Exam Vitals and nursing note reviewed.  Constitutional:      Appearance: She is well-developed.  HENT:  Head: Normocephalic and atraumatic.  Eyes:     Pupils: Pupils are equal, round, and reactive to light.  Cardiovascular:     Rate and Rhythm: Normal rate and regular rhythm.  Pulmonary:     Effort: No respiratory distress.     Breath sounds: No stridor.  Abdominal:     General: There is no distension.  Musculoskeletal:     Cervical back: Normal range of motion.     Right lower leg: Edema (mildly) present.     Left lower leg: Edema (mildly) present.  Skin:    General: Skin is warm and dry.     Comments: Unkept toenails  Her legs appear to be dry flaky with maybe a little bit of dirt.  Neurological:     General: No focal deficit present.     Mental Status: She is alert.     ED Results / Procedures / Treatments   Labs (all labs ordered are listed, but only  abnormal results are displayed) Labs Reviewed  BASIC METABOLIC PANEL - Abnormal; Notable for the following components:      Result Value   Glucose, Bld 163 (*)    BUN 35 (*)    Creatinine, Ser 1.79 (*)    Calcium 8.3 (*)    GFR, Estimated 30 (*)    All other components within normal limits  CBC - Abnormal; Notable for the following components:   RBC 3.65 (*)    Hemoglobin 9.7 (*)    HCT 31.4 (*)    RDW 16.6 (*)    All other components within normal limits  BRAIN NATRIURETIC PEPTIDE    EKG None  Radiology No results found.  Procedures Procedures    Medications Ordered in ED Medications - No data to display  ED Course/ Medical Decision Making/ A&P                                 Medical Decision Making Risk Prescription drug management.   Patient with chronic kidney disease at her baseline since January.  Has lower extremity edema, chest x-ray does not show any CHF if she has any chest pain or shortness of breath to suggest that she has a new cardiac issue or pulmonary embolus.  Her legs are symmetric making DVT unlikely.  Do not see out that it is a cellulitis on exam.  Pulses are intact.  She has mild edema with mildly pitting so I suggested multiple treatments such as compression socks, elevation and decreasing her salt.  Also could be related to the Norvasc so we will DC that started hydrochlorothiazide instead.  She will follow-up with her primary doctor to few days to make sure things are improving and ensure that HCTZ is okay. Will follow up with podiatry for nail and foot care.  Final Clinical Impression(s) / ED Diagnoses Final diagnoses:  Leg edema    Rx / DC Orders ED Discharge Orders          Ordered    hydrochlorothiazide (HYDRODIURIL) 25 MG tablet  Daily        10/11/22 0507              Kieli Golladay, Barbara Cower, MD 10/11/22 [phone]

## 2022-10-11 NOTE — Telephone Encounter (Signed)
Call from pt who stated she went to the ER yesterday for leg swelling. Stated the doctor told to stop 1 med and start another; she's unsure which medication. Per chart hydrochlorothiazide was ordered as "Print"; I asked pt if she has the rx - she stated she has so many papers and she does not see the rx. I called the ER charge nurse who stated pt have to come back to pick up another rx - pt stated she does not want to. Per ER note -  "Also could be related to the Norvasc so we will DC that started hydrochlorothiazide instead." Informed pt I will ask the doctor here if hydrochlorothiazide rx can be re-written and sent to her pharmacy. Also review ER note. Pt's telephone # [phone]. Thanks

## 2022-10-30 ENCOUNTER — Encounter: Payer: 59 | Admitting: Student

## 2022-11-14 ENCOUNTER — Other Ambulatory Visit: Payer: Self-pay

## 2022-11-14 DIAGNOSIS — E113593 Type 2 diabetes mellitus with proliferative diabetic retinopathy without macular edema, bilateral: Secondary | ICD-10-CM

## 2022-11-14 MED ORDER — LIRAGLUTIDE 18 MG/3ML ~~LOC~~ SOPN
1.8000 mg | PEN_INJECTOR | Freq: Every day | SUBCUTANEOUS | 2 refills | Status: DC
Start: 2022-11-14 — End: 2023-01-01

## 2022-11-14 NOTE — Telephone Encounter (Signed)
Per pharmacy, Chelsea [name] has 1 refill of victoza left. Her insurance would like her to get 3 months at a time. If possible send a new rx for 3 months.

## 2022-11-16 ENCOUNTER — Ambulatory Visit: Payer: 59 | Admitting: Student

## 2022-11-16 VITALS — BP 142/53 | HR 79 | Temp 98.0°F | Ht 65.0 in | Wt 215.0 lb

## 2022-11-16 DIAGNOSIS — I152 Hypertension secondary to endocrine disorders: Secondary | ICD-10-CM | POA: Diagnosis not present

## 2022-11-16 DIAGNOSIS — R600 Localized edema: Secondary | ICD-10-CM | POA: Diagnosis not present

## 2022-11-16 DIAGNOSIS — D638 Anemia in other chronic diseases classified elsewhere: Secondary | ICD-10-CM | POA: Diagnosis not present

## 2022-11-16 DIAGNOSIS — R5381 Other malaise: Secondary | ICD-10-CM

## 2022-11-16 DIAGNOSIS — Z7985 Long-term (current) use of injectable non-insulin antidiabetic drugs: Secondary | ICD-10-CM

## 2022-11-16 DIAGNOSIS — Z794 Long term (current) use of insulin: Secondary | ICD-10-CM

## 2022-11-16 DIAGNOSIS — E1159 Type 2 diabetes mellitus with other circulatory complications: Secondary | ICD-10-CM | POA: Diagnosis not present

## 2022-11-16 DIAGNOSIS — E113593 Type 2 diabetes mellitus with proliferative diabetic retinopathy without macular edema, bilateral: Secondary | ICD-10-CM

## 2022-11-16 NOTE — Patient Instructions (Signed)
  Thank you, [name], for allowing [place] to provide your care today. Today we discussed . . .  > Leg swelling       -I would like to further evaluate your left leg swelling with an ultrasound of the veins in your leg to check for any blood clots.  I will call you with the results of this and they should call you to schedule here at [facility].  Please let me know if you have any worsening symptoms or new associated symptoms with your leg swelling.  We will see you back in about 6 weeks to check your blood pressure again and make any medication changes if needed but I will call you when I have the results of your ultrasound.   Referrals ordered today:    Referral Orders         Ambulatory referral to Physical Therapy         Follow up:  6 weeks     Remember:     Should you have any questions or concerns please call the internal medicine clinic at [phone].     Rocky Morel, DO [facility]

## 2022-11-16 NOTE — Assessment & Plan Note (Signed)
Patient comes in after an ED visit on 10/10/2022 with complaints of leg swelling.  At that visit she was supposed to stop amlodipine and start hydrochlorothiazide however she has not stopped amlodipine and it is still in her pill pack.  She has started hydrochlorothiazide 12.5 mg daily.  She continues to have lower extremity edema with bilateral pedal edema and left greater than right pitting edema up to the mid shin.  She has no history of blood clots and no pain or cramps.  This is a chronic swelling and she has been evaluated for DVT a few years ago.  However due to their concerns of changing we will further evaluate with lower extremity venous Dopplers.

## 2022-11-16 NOTE — Assessment & Plan Note (Signed)
A1c 6.5 on 11/14/2022.  She does not have a recent urine microalbumin creatinine ratio and she was unfortunately unable to void today.  No changes in medications today. - Continue Victoza 1.8 mg daily

## 2022-11-16 NOTE — Assessment & Plan Note (Signed)
Patient inquires about physical therapy which she was referred after her visit in August.  Referral shows that they are unable to reach her so I will send information from the physical therapist to the patient so that they can contact them. [facility] at [facility] Physical therapist [place] Address: [address] [place], [place], [place] [address] Phone: [phone]
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
## 2022-11-16 NOTE — Assessment & Plan Note (Signed)
Blood pressure 142/53 today.  Although she was supposed to be taken off amlodipine after some lower extremity edema she is currently taking amlodipine 10 mg daily, carvedilol 25 mg twice daily, and hydrochlorothiazide 12.5 mg daily.  As described elsewhere I do not think her lower extremity edema is due to her amlodipine use so I would not make any changes today. - Continue amlodipine 10 mg daily, carvedilol 25 mg twice daily, and hydrochlorothiazide 12.5 mg daily - Return in 4 to 6 weeks for repeat blood pressure and consider increasing hydrochlorothiazide if needed

## 2022-11-19 ENCOUNTER — Other Ambulatory Visit: Payer: Self-pay | Admitting: Internal Medicine

## 2022-11-19 DIAGNOSIS — Z794 Long term (current) use of insulin: Secondary | ICD-10-CM

## 2022-11-20 NOTE — Progress Notes (Signed)
Internal Medicine Clinic Attending  Case discussed with the resident at the time of the visit.  We reviewed the resident's history and exam and pertinent patient test results.  I agree with the assessment, diagnosis, and plan of care documented in the resident's note.  

## 2022-11-26 ENCOUNTER — Other Ambulatory Visit: Payer: Self-pay | Admitting: Internal Medicine

## 2022-11-26 DIAGNOSIS — Z8673 Personal history of transient ischemic attack (TIA), and cerebral infarction without residual deficits: Secondary | ICD-10-CM

## 2022-12-05 ENCOUNTER — Other Ambulatory Visit: Payer: Self-pay | Admitting: Internal Medicine

## 2022-12-18 ENCOUNTER — Encounter: Payer: 59 | Admitting: Student

## 2022-12-19 ENCOUNTER — Ambulatory Visit (HOSPITAL_COMMUNITY): Payer: 59 | Attending: Internal Medicine

## 2023-01-01 ENCOUNTER — Other Ambulatory Visit: Payer: Self-pay | Admitting: Internal Medicine

## 2023-01-01 DIAGNOSIS — E113593 Type 2 diabetes mellitus with proliferative diabetic retinopathy without macular edema, bilateral: Secondary | ICD-10-CM

## 2023-01-02 ENCOUNTER — Encounter: Payer: 59 | Admitting: Student

## 2023-01-08 ENCOUNTER — Encounter: Payer: 59 | Admitting: Student

## 2023-01-30 ENCOUNTER — Other Ambulatory Visit: Payer: Self-pay | Admitting: Internal Medicine

## 2023-01-30 DIAGNOSIS — I152 Hypertension secondary to endocrine disorders: Secondary | ICD-10-CM

## 2023-02-15 ENCOUNTER — Emergency Department (HOSPITAL_COMMUNITY): Payer: 59

## 2023-02-15 ENCOUNTER — Emergency Department (HOSPITAL_COMMUNITY)
Admission: EM | Admit: 2023-02-15 | Discharge: 2023-02-15 | Disposition: A | Discharge status: Home / Self Care | Payer: 59 | Attending: Emergency Medicine | Admitting: Emergency Medicine

## 2023-02-15 ENCOUNTER — Other Ambulatory Visit: Payer: Self-pay

## 2023-02-15 ENCOUNTER — Encounter (HOSPITAL_COMMUNITY): Payer: Self-pay | Admitting: Emergency Medicine

## 2023-02-15 DIAGNOSIS — N189 Chronic kidney disease, unspecified: Secondary | ICD-10-CM | POA: Diagnosis not present

## 2023-02-15 DIAGNOSIS — I129 Hypertensive chronic kidney disease with stage 1 through stage 4 chronic kidney disease, or unspecified chronic kidney disease: Secondary | ICD-10-CM | POA: Insufficient documentation

## 2023-02-15 DIAGNOSIS — S0990XA Unspecified injury of head, initial encounter: Secondary | ICD-10-CM | POA: Diagnosis not present

## 2023-02-15 DIAGNOSIS — Z7982 Long term (current) use of aspirin: Secondary | ICD-10-CM | POA: Insufficient documentation

## 2023-02-15 DIAGNOSIS — S3992XA Unspecified injury of lower back, initial encounter: Secondary | ICD-10-CM | POA: Diagnosis present

## 2023-02-15 DIAGNOSIS — Z79899 Other long term (current) drug therapy: Secondary | ICD-10-CM | POA: Insufficient documentation

## 2023-02-15 DIAGNOSIS — Z794 Long term (current) use of insulin: Secondary | ICD-10-CM | POA: Insufficient documentation

## 2023-02-15 DIAGNOSIS — S300XXA Contusion of lower back and pelvis, initial encounter: Secondary | ICD-10-CM | POA: Insufficient documentation

## 2023-02-15 DIAGNOSIS — W19XXXA Unspecified fall, initial encounter: Secondary | ICD-10-CM | POA: Insufficient documentation

## 2023-02-15 DIAGNOSIS — E1122 Type 2 diabetes mellitus with diabetic chronic kidney disease: Secondary | ICD-10-CM | POA: Insufficient documentation

## 2023-02-15 DIAGNOSIS — I6782 Cerebral ischemia: Secondary | ICD-10-CM | POA: Diagnosis not present

## 2023-02-15 DIAGNOSIS — S199XXA Unspecified injury of neck, initial encounter: Secondary | ICD-10-CM | POA: Diagnosis not present

## 2023-02-15 DIAGNOSIS — Z043 Encounter for examination and observation following other accident: Secondary | ICD-10-CM | POA: Diagnosis not present

## 2023-02-15 DIAGNOSIS — R531 Weakness: Secondary | ICD-10-CM | POA: Diagnosis not present

## 2023-02-15 DIAGNOSIS — Z743 Need for continuous supervision: Secondary | ICD-10-CM | POA: Diagnosis not present

## 2023-02-15 MED ORDER — SODIUM CHLORIDE 0.9 % IV BOLUS: 1000.0000 mL | Freq: Once | INTRAVENOUS | Status: DC

## 2023-02-15 NOTE — ED Provider Notes (Signed)
 [place] [facility] Provider Note   CSN: [id number] Arrival date & time: 02/15/23  1608     History  Chief Complaint  Patient presents with   Fall    Pt unable to assist her self much according to family.    Chelsea [name] is a 73 y.o. female.  With a history of hypertension, hyperlipidemia, type 2 diabetes, CKD and status post CVA who presents to the ED after reported fall.  Patient reports that she was seated upright on the edge of her bed and slid down.  She was unable to get up under her own power and called 911 for help.  No head strike or LOC.  Has no significant discomfort.  Denies chest pain shortness of breath headaches neck pain abdominal pain or pain in extremities.  She can typically ambulate with a walker and with assistance and lives at home with home health aides and help from her family   Fall       Home Medications Prior to Admission medications   Medication Sig Start Date End Date Taking? Authorizing Provider  albuterol  (VENTOLIN  HFA) 108 (90 Base) MCG/ACT inhaler INHALE 2 PUFFS into lungs EVERY 6 HOURS AS NEEDED FOR WHEEZING OR SHORTNESS OF BREATH 02/14/21   Sherlean Failing, MD  amLODipine  (NORVASC ) 10 MG tablet TAKE 1 TABLET BY MOUTH EVERY DAY 02/01/23   Addie Perkins, DO  ASPIRIN  EC ADULT LOW DOSE 81 MG tablet TAKE 1 TABLET BY MOUTH EVERY DAY 11/26/22   Addie Perkins, DO  atorvastatin  (LIPITOR ) 80 MG tablet Take 1 tablet (80 mg total) by mouth daily. 09/24/22   Addie Perkins, DO  carvedilol  (COREG ) 25 MG tablet TAKE 1 TABLET BY MOUTH TWICE DAILY WITH MEALS 02/01/23   Addie Perkins, DO  ezetimibe  (ZETIA ) 10 MG tablet TAKE 1 TABLET BY MOUTH EVERY DAY 12/06/22   Francella Rogue, MD  fluticasone  (FLONASE ) 50 MCG/ACT nasal spray USE 2 SPRAYS IN EACH NOSTRIL EVERY DAY 12/18/21   Addie Perkins, DO  glucose blood (ACCU-CHEK GUIDE) test strip Use to check blood sugar two times per day. 09/24/22   Addie Perkins, DO  hydrochlorothiazide  (HYDRODIURIL )  12.5 MG tablet Take 1 tablet (12.5 mg total) by mouth daily. 10/11/22   Nooruddin, Saad, MD  Insulin  Pen Needle (PEN NEEDLES) 32G X 4 MM MISC 1 each by Does not apply route daily in the afternoon. Use to inject victoza  once daily. 09/24/22   Addie Perkins, DO  liraglutide  (VICTOZA ) 18 MG/3ML SOPN Inject 1.8 mg into the skin daily. 01/01/23   Addie Perkins, DO  sodium bicarbonate  650 MG tablet TAKE 1 TABLET BY MOUTH 3 TIMES DAILY 10/10/22   Addie Perkins, DO  traZODone  (DESYREL ) 50 MG tablet TAKE 1/2 TABLET TO 1 BY MOUTH AT BEDTIME 10/02/22   Addie Perkins, DO  Lancet Device MISC 1 each by Does not apply route 3 (three) times daily. 04/23/11 04/05/14  Lucillie Degree, MD      Allergies    Ace inhibitors, Aldactone  [spironolactone ], Angiotensin receptor blockers, Atarax  [hydroxyzine ], Nsaids, Penicillins, and Zestril  [lisinopril ]    Review of Systems   Review of Systems  Physical Exam Updated Vital Signs BP (!) 100/45   Pulse 75   Temp (!) 97.4 F (36.3 C) (Oral)   Resp 18   Ht 5' 7 (1.702 m)   Wt 81.6 kg   BMI 28.19 kg/m  Physical Exam Vitals and nursing note reviewed.  HENT:     Head: Normocephalic and  atraumatic.  Eyes:     Pupils: Pupils are equal, round, and reactive to light.  Cardiovascular:     Rate and Rhythm: Normal rate and regular rhythm.  Pulmonary:     Effort: Pulmonary effort is normal.     Breath sounds: Normal breath sounds.  Abdominal:     Palpations: Abdomen is soft.     Tenderness: There is no abdominal tenderness.  Musculoskeletal:     Cervical back: Neck supple. No tenderness.     Comments: No midline tenderness to palp deformity or back  Skin:    General: Skin is warm and dry.  Neurological:     Mental Status: She is alert.     Sensory: No sensory deficit.     Motor: No weakness.  Psychiatric:        Mood and Affect: Mood normal.     ED Results / Procedures / Treatments   Labs (all labs ordered are listed, but only abnormal results are displayed) Labs Reviewed   RESP PANEL BY RT-PCR (RSV, FLU A&B, COVID)  [id number]  BASIC METABOLIC PANEL  CBC WITH DIFFERENTIAL/PLATELET    EKG None  Radiology CT Head Wo Contrast Result Date: 02/15/2023 CLINICAL DATA:  Head trauma, moderate-severe fall; Neck trauma (Age >= 65y) fall EXAM: CT HEAD WITHOUT CONTRAST CT CERVICAL SPINE WITHOUT CONTRAST TECHNIQUE: Multidetector CT imaging of the head and cervical spine was performed following the standard protocol without intravenous contrast. Multiplanar CT image reconstructions of the cervical spine were also generated. RADIATION DOSE REDUCTION: This exam was performed according to the departmental dose-optimization program which includes automated exposure control, adjustment of the mA and/or kV according to patient size and/or use of iterative reconstruction technique. COMPARISON:  Head CT 10/06/17 FINDINGS: CT HEAD FINDINGS Brain: No hemorrhage. No hydrocephalus. No extra-axial fluid collection. No CT evidence of an acute cortical infarct. No mass effect. No mass lesion. Chronic left thalamic infarct. Chronic infarcts in the right basal ganglia. There is background moderate chronic microvascular ischemic change. Vascular: No hyperdense vessel or unexpected calcification. Skull: Normal. Negative for fracture or focal lesion. Sinuses/Orbits: No middle ear or mastoid effusion. Paranasal sinuses are clear. Right lens replacement. Orbits are otherwise unremarkable. Other: None. CT CERVICAL SPINE FINDINGS Alignment: Normal. Skull base and vertebrae: No acute fracture. No primary bone lesion or focal pathologic process. Soft tissues and spinal canal: No prevertebral fluid or swelling. No visible canal hematoma. Disc levels:  No CT evidence of high-grade spinal canal stenosis. Upper chest: Negative. Other: No IMPRESSION: 1. No CT evidence of intracranial injury. 2. No acute fracture or traumatic subluxation of the cervical spine. Electronically Signed   By: Lyndall Gore M.D.   On: 02/15/2023  19:59   CT Cervical Spine Wo Contrast Result Date: 02/15/2023 CLINICAL DATA:  Head trauma, moderate-severe fall; Neck trauma (Age >= 65y) fall EXAM: CT HEAD WITHOUT CONTRAST CT CERVICAL SPINE WITHOUT CONTRAST TECHNIQUE: Multidetector CT imaging of the head and cervical spine was performed following the standard protocol without intravenous contrast. Multiplanar CT image reconstructions of the cervical spine were also generated. RADIATION DOSE REDUCTION: This exam was performed according to the departmental dose-optimization program which includes automated exposure control, adjustment of the mA and/or kV according to patient size and/or use of iterative reconstruction technique. COMPARISON:  Head CT 10/06/17 FINDINGS: CT HEAD FINDINGS Brain: No hemorrhage. No hydrocephalus. No extra-axial fluid collection. No CT evidence of an acute cortical infarct. No mass effect. No mass lesion. Chronic left thalamic infarct. Chronic infarcts in  the right basal ganglia. There is background moderate chronic microvascular ischemic change. Vascular: No hyperdense vessel or unexpected calcification. Skull: Normal. Negative for fracture or focal lesion. Sinuses/Orbits: No middle ear or mastoid effusion. Paranasal sinuses are clear. Right lens replacement. Orbits are otherwise unremarkable. Other: None. CT CERVICAL SPINE FINDINGS Alignment: Normal. Skull base and vertebrae: No acute fracture. No primary bone lesion or focal pathologic process. Soft tissues and spinal canal: No prevertebral fluid or swelling. No visible canal hematoma. Disc levels:  No CT evidence of high-grade spinal canal stenosis. Upper chest: Negative. Other: No IMPRESSION: 1. No CT evidence of intracranial injury. 2. No acute fracture or traumatic subluxation of the cervical spine. Electronically Signed   By: Lyndall Gore M.D.   On: 02/15/2023 19:59   DG Chest Portable 1 View Result Date: 02/15/2023 CLINICAL DATA:  Clemens out of bed today and unable to get up.  EXAM: PORTABLE CHEST 1 VIEW COMPARISON:  02/28/2022 FINDINGS: Stable cardiomediastinal silhouette. No focal consolidation, pleural effusion, or pneumothorax. No displaced rib fractures. IMPRESSION: No active disease. Electronically Signed   By: Norman Gatlin M.D.   On: 02/15/2023 19:34    Procedures Procedures    Medications Ordered in ED Medications  sodium chloride  0.9 % bolus 1,000 mL (has no administration in time range)    ED Course/ Medical Decision Making/ A&P                                 Medical Decision Making 73 year old female with history as above presenting via EMS after fall from bed.  Was unable to get up under her own power or with help from her daughter.  EMS called for transport.  She and her daughter both states she did not really fall but slid down at the edge of her bed.  No acute traumatic findings or complaints of pain.  Patient feels well at her baseline.  CT head C-spine and chest x-ray are unremarkable.  Both her and her daughter did not feel the need for lab work which I am okay with given that she is at her baseline without injuries in the fall was mechanical in nature.  Stable for discharge under the care of her daughter  Amount and/or Complexity of Data Reviewed Labs: ordered. Radiology: ordered.           Final Clinical Impression(s) / ED Diagnoses Final diagnoses:  Fall, initial encounter  Contusion of buttock, initial encounter    Rx / DC Orders ED Discharge Orders     None         Pamella Ozell LABOR, DO 02/15/23 2028

## 2023-02-15 NOTE — ED Triage Notes (Signed)
 Came from home, had a fall. No blood thinners or obvious injury according to ems. Family reports her self assistance was limited.  Medication for high bp but bp 100/50. Family reports diffculty in getting line EKG unremarkable.

## 2023-02-15 NOTE — Progress Notes (Signed)
 Pt refusing lab sticks/iv placement ,Penna DO aware.

## 2023-02-15 NOTE — ED Triage Notes (Addendum)
 Darkned spot noticed on right heel. Pt reports " I hurted it and my brother says they were gonna tape it up." Pt also reports "not getting out much".  Heel elevated off bed.  Warm blanket offered and refused.

## 2023-02-15 NOTE — Discharge Instructions (Signed)
 You are seen in the emergency ferment after a fall The CAT scan of your head neck and chest x-ray looked okay You did not want blood work here today You should follow-up with your primary care doctor in 1 week for reevaluation Return to the emerged part for repeated falls or any other concerns

## 2023-02-21 ENCOUNTER — Telehealth: Payer: Self-pay | Admitting: Podiatry

## 2023-02-21 NOTE — Telephone Encounter (Signed)
 Pts daughter called yesterday at 508pm stating we need to cancel her moms appt due to she is just getting out of the hospital.  I returned call and but did not see an appt scheduled. Pts daughter was asking about assistance getting pt to and from the home to the office. I advised her to call the insurance company and sometimes they can assist in that. I also told her to check with them for doctors making house calls to possibly come to the home since it is so hard to get pt to appts.She said thank you for the information.

## 2023-03-25 ENCOUNTER — Telehealth: Payer: Self-pay

## 2023-03-25 NOTE — Telephone Encounter (Addendum)
Return call to pt's daughter,Tara. Stated pt does not want to get out the bed; has been going on for 1.5 months. Stated she (along with help) was able to get her OOB on Saturday;stated pt became disoriented with a blank stare. But once back in the bed , pt was back to herself. The daughter stated it's painful to move her legs. Also stated pt is more aggressive. An appt was offered but the daughter stated she's unable to bring pt;she will definitely need help. I suggested ambualnce in which she has to call and schedule. The daughter stated she does not know what to do. I told her I will inform the doctor.

## 2023-03-25 NOTE — Telephone Encounter (Signed)
 Pt's daughter states her mother is unable to move out of the bed. Per daughter, she is having leg pain, and when you touch her thigh she would screamed in pain. Offered an appt for tomorrow, pt daughter declined, stating she would need someone to lift her mother out of the bed and take her to the appt. Please call pt's daughter back.

## 2023-03-26 ENCOUNTER — Emergency Department (HOSPITAL_BASED_OUTPATIENT_CLINIC_OR_DEPARTMENT_OTHER): Payer: 59

## 2023-03-26 ENCOUNTER — Emergency Department (HOSPITAL_COMMUNITY): Payer: 59

## 2023-03-26 ENCOUNTER — Observation Stay (HOSPITAL_COMMUNITY)
Admission: EM | Admit: 2023-03-26 | Discharge: 2023-03-28 | Disposition: A | Discharge status: Skilled Nursing Facility | Payer: 59 | Attending: Internal Medicine | Admitting: Internal Medicine

## 2023-03-26 ENCOUNTER — Encounter (HOSPITAL_COMMUNITY): Payer: Self-pay

## 2023-03-26 ENCOUNTER — Other Ambulatory Visit: Payer: Self-pay

## 2023-03-26 DIAGNOSIS — E1122 Type 2 diabetes mellitus with diabetic chronic kidney disease: Secondary | ICD-10-CM | POA: Insufficient documentation

## 2023-03-26 DIAGNOSIS — D649 Anemia, unspecified: Secondary | ICD-10-CM | POA: Insufficient documentation

## 2023-03-26 DIAGNOSIS — J45909 Unspecified asthma, uncomplicated: Secondary | ICD-10-CM | POA: Diagnosis not present

## 2023-03-26 DIAGNOSIS — E785 Hyperlipidemia, unspecified: Secondary | ICD-10-CM | POA: Insufficient documentation

## 2023-03-26 DIAGNOSIS — R531 Weakness: Principal | ICD-10-CM

## 2023-03-26 DIAGNOSIS — N189 Chronic kidney disease, unspecified: Secondary | ICD-10-CM | POA: Insufficient documentation

## 2023-03-26 DIAGNOSIS — R55 Syncope and collapse: Principal | ICD-10-CM | POA: Insufficient documentation

## 2023-03-26 DIAGNOSIS — I6782 Cerebral ischemia: Secondary | ICD-10-CM | POA: Diagnosis not present

## 2023-03-26 DIAGNOSIS — M79661 Pain in right lower leg: Secondary | ICD-10-CM | POA: Diagnosis not present

## 2023-03-26 DIAGNOSIS — L89610 Pressure ulcer of right heel, unstageable: Secondary | ICD-10-CM | POA: Diagnosis not present

## 2023-03-26 DIAGNOSIS — Z743 Need for continuous supervision: Secondary | ICD-10-CM | POA: Diagnosis not present

## 2023-03-26 DIAGNOSIS — L97419 Non-pressure chronic ulcer of right heel and midfoot with unspecified severity: Secondary | ICD-10-CM | POA: Diagnosis not present

## 2023-03-26 DIAGNOSIS — Z7982 Long term (current) use of aspirin: Secondary | ICD-10-CM | POA: Diagnosis not present

## 2023-03-26 DIAGNOSIS — N179 Acute kidney failure, unspecified: Secondary | ICD-10-CM | POA: Diagnosis not present

## 2023-03-26 DIAGNOSIS — R6889 Other general symptoms and signs: Secondary | ICD-10-CM | POA: Diagnosis not present

## 2023-03-26 DIAGNOSIS — Z8673 Personal history of transient ischemic attack (TIA), and cerebral infarction without residual deficits: Secondary | ICD-10-CM | POA: Diagnosis not present

## 2023-03-26 DIAGNOSIS — Z79899 Other long term (current) drug therapy: Secondary | ICD-10-CM | POA: Diagnosis not present

## 2023-03-26 DIAGNOSIS — I129 Hypertensive chronic kidney disease with stage 1 through stage 4 chronic kidney disease, or unspecified chronic kidney disease: Secondary | ICD-10-CM | POA: Insufficient documentation

## 2023-03-26 DIAGNOSIS — M2011 Hallux valgus (acquired), right foot: Secondary | ICD-10-CM | POA: Diagnosis not present

## 2023-03-26 DIAGNOSIS — R4781 Slurred speech: Secondary | ICD-10-CM | POA: Diagnosis not present

## 2023-03-26 LAB — COMPREHENSIVE METABOLIC PANEL
ALT: 8 U/L (ref 0–44)
AST: 12 U/L — ABNORMAL LOW (ref 15–41)
Albumin: 2.5 g/dL — ABNORMAL LOW (ref 3.5–5.0)
Alkaline Phosphatase: 57 U/L (ref 38–126)
Anion gap: 15 (ref 5–15)
BUN: 56 mg/dL — ABNORMAL HIGH (ref 8–23)
CO2: 28 mmol/L (ref 22–32)
Calcium: 8.7 mg/dL — ABNORMAL LOW (ref 8.9–10.3)
Chloride: 99 mmol/L (ref 98–111)
Creatinine, Ser: 2.22 mg/dL — ABNORMAL HIGH (ref 0.44–1.00)
GFR, Estimated: 23 mL/min — ABNORMAL LOW (ref >60)
Glucose, Bld: 113 mg/dL — ABNORMAL HIGH (ref 70–99)
Potassium: 3.6 mmol/L (ref 3.5–5.1)
Sodium: 142 mmol/L (ref 135–145)
Total Bilirubin: 0.6 mg/dL (ref 0.0–1.2)
Total Protein: 6.9 g/dL (ref 6.5–8.1)

## 2023-03-26 LAB — CBC WITH DIFFERENTIAL/PLATELET
Abs Immature Granulocytes: 0.02 10*3/uL (ref 0.00–0.07)
Basophils Absolute: 0 10*3/uL (ref 0.0–0.1)
Basophils Relative: 1 %
Eosinophils Absolute: 0.3 10*3/uL (ref 0.0–0.5)
Eosinophils Relative: 5 %
HCT: 30 % — ABNORMAL LOW (ref 36.0–46.0)
Hemoglobin: 9.3 g/dL — ABNORMAL LOW (ref 12.0–15.0)
Immature Granulocytes: 0 %
Lymphocytes Relative: 37 %
Lymphs Abs: 2 10*3/uL (ref 0.7–4.0)
MCH: 26.9 pg (ref 26.0–34.0)
MCHC: 31 g/dL (ref 30.0–36.0)
MCV: 86.7 fL (ref 80.0–100.0)
Monocytes Absolute: 0.4 10*3/uL (ref 0.1–1.0)
Monocytes Relative: 7 %
Neutro Abs: 2.8 10*3/uL (ref 1.7–7.7)
Neutrophils Relative %: 50 %
Platelets: 268 10*3/uL (ref 150–400)
RBC: 3.46 MIL/uL — ABNORMAL LOW (ref 3.87–5.11)
RDW: 16 % — ABNORMAL HIGH (ref 11.5–15.5)
WBC: 5.4 10*3/uL (ref 4.0–10.5)
nRBC: 0 % (ref 0.0–0.2)

## 2023-03-26 LAB — CBG MONITORING, ED: Glucose-Capillary: 96 mg/dL (ref 70–99)

## 2023-03-26 LAB — GLUCOSE, CAPILLARY: Glucose-Capillary: 128 mg/dL — ABNORMAL HIGH (ref 70–99)

## 2023-03-26 MED ORDER — ENOXAPARIN SODIUM 30 MG/0.3ML IJ SOSY
30.0000 mg | PREFILLED_SYRINGE | INTRAMUSCULAR | Status: DC
  Administered 2023-03-26 – 2023-03-27 (×2): 30 mg via SUBCUTANEOUS
  Filled 2023-03-26 (×2): qty 0.3

## 2023-03-26 MED ORDER — EZETIMIBE 10 MG PO TABS
10.0000 mg | ORAL_TABLET | Freq: Every day | ORAL | Status: DC
  Administered 2023-03-27 – 2023-03-28 (×2): 10 mg via ORAL
  Filled 2023-03-26 (×2): qty 1

## 2023-03-26 MED ORDER — ATORVASTATIN CALCIUM 40 MG PO TABS
80.0000 mg | ORAL_TABLET | Freq: Every day | ORAL | Status: DC
  Administered 2023-03-27 – 2023-03-28 (×2): 80 mg via ORAL
  Filled 2023-03-26 (×2): qty 2

## 2023-03-26 MED ORDER — SODIUM BICARBONATE 650 MG PO TABS
650.0000 mg | ORAL_TABLET | Freq: Three times a day (TID) | ORAL | Status: DC
  Administered 2023-03-26 – 2023-03-28 (×6): 650 mg via ORAL
  Filled 2023-03-26 (×6): qty 1

## 2023-03-26 MED ORDER — SODIUM CHLORIDE 0.9 % IV BOLUS
500.0000 mL | Freq: Once | INTRAVENOUS | Status: AC
  Administered 2023-03-26: 500 mL via INTRAVENOUS

## 2023-03-26 MED ORDER — ASPIRIN 81 MG PO TBEC
81.0000 mg | DELAYED_RELEASE_TABLET | Freq: Every day | ORAL | Status: DC
  Administered 2023-03-27 – 2023-03-28 (×2): 81 mg via ORAL
  Filled 2023-03-26 (×2): qty 1

## 2023-03-26 MED ORDER — LACTATED RINGERS IV BOLUS
1000.0000 mL | Freq: Once | INTRAVENOUS | Status: AC
  Administered 2023-03-26: 1000 mL via INTRAVENOUS
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
## 2023-03-26 NOTE — Plan of Care (Addendum)
New admit for weakness. Pt is AxOx2, RA , on cardiac monitor. Pressure injury noted and documented, daughter is at bedside.  Tele-monitor tech called that patient's HR dropped to 20's but recovered. RN was in the room during this occurrence. Patient was asymptomatic, oncoming RN made aware.  Patient has IVF that is past due from the ED but there is no IV access present. IV team as paged as patient's daughter endorsed that mom is a hard stick. Will pass on to oncoming nurse.   Problem: Education: Goal: Knowledge of General Education information will improve Description: Including pain rating scale, medication(s)/side effects and non-pharmacologic comfort measures Outcome: Not Met (add Reason)   Problem: Activity: Goal: Risk for activity intolerance will decrease Outcome: Not Met (add Reason)   Problem: Pain Managment: Goal: General experience of comfort will improve and/or be controlled Outcome: Not Met (add Reason)   Problem: Safety: Goal: Ability to remain free from injury will improve Outcome: Not Met (add Reason)   Problem: Skin Integrity: Goal: Risk for impaired skin integrity will decrease Outcome: Not Met (add Reason)
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
## 2023-03-26 NOTE — ED Triage Notes (Signed)
[facility] reports pt coming from home for generalized weakness. Has been going on since Sunday. Pt has not allowed family to get her out of bed since then. Pt of leg pain and right heal pain.

## 2023-03-26 NOTE — ED Provider Notes (Signed)
Accepted handoff at shift change from [facility]. Please see prior provider note for more detail.   Briefly: Patient is 73 y.o.   DDX: concern for Weak for 2 months, not getting out of bed for 2 months. Getting generalized weakness workup. Reported left leg pain. No TTP throughout exam. Hx of HTN, DM, HLD. Unhelpful historian, suspect undiagnosed dementia of some severity. Getting workup, follow up on labs and imaging. May end up being a boarder if no admittable diagnosis.  CBC overall unremarkable, anemia stable compared to baseline.  CMP is notable for new AKI, BUN 56, creatinine 2.22 from baseline around 1.8.  CT head without contrast with no acute intracranial abnormality.  Plain film foot x-ray with no evidence of acute osteomyelitis.  Plan: Patient having difficulty ambulating with assistance, she normally ambulates with walker but has had generalized weakness over the last few weeks per daughter.  Given new AKI, generalized weakness I do think that she is a good candidate for admission for PT/OT, hydration.  I spoke with the internal medicine resident who agrees to admission at this time.     Olene Floss, PA-C 03/26/23 1714    Tegeler, Canary Brim, MD 03/26/23 [id number]

## 2023-03-26 NOTE — Hospital Course (Signed)
Creatinine 1.79->2.22  CT head  Right foot xray IMPRESSION: 1. Posterior plantar heel soft tissue ulcer/wound. No definite calcaneal cortical erosion is seen to indicate radiographic evidence of acute osteomyelitis. 2. Mild-to-moderate hallux valgus.   AKI on CKD Creatinine baseline 1.5-1.8  Chronic anemia

## 2023-03-26 NOTE — Telephone Encounter (Signed)
Talked to Delice Bison - informed of Dr Nooruddin's response and  to let her know it's best for pt be evaluated in person either here or the ED. Stated she will call ambulance services first and call the office back to coordinated date/time for an appt.

## 2023-03-26 NOTE — Telephone Encounter (Signed)
Pt called back and spoke to front office and stated she will bring pt to the ER after she gets off of work today.

## 2023-03-27 DIAGNOSIS — R55 Syncope and collapse: Principal | ICD-10-CM

## 2023-03-27 LAB — CBC
HCT: 34.3 % — ABNORMAL LOW (ref 36.0–46.0)
Hemoglobin: 10.8 g/dL — ABNORMAL LOW (ref 12.0–15.0)
MCH: 26.9 pg (ref 26.0–34.0)
MCHC: 31.5 g/dL (ref 30.0–36.0)
MCV: 85.5 fL (ref 80.0–100.0)
Platelets: 317 10*3/uL (ref 150–400)
RBC: 4.01 MIL/uL (ref 3.87–5.11)
RDW: 15.9 % — ABNORMAL HIGH (ref 11.5–15.5)
WBC: 5.3 10*3/uL (ref 4.0–10.5)
nRBC: 0 % (ref 0.0–0.2)

## 2023-03-27 LAB — BASIC METABOLIC PANEL
Anion gap: 16 — ABNORMAL HIGH (ref 5–15)
BUN: 48 mg/dL — ABNORMAL HIGH (ref 8–23)
CO2: 25 mmol/L (ref 22–32)
Calcium: 9.2 mg/dL (ref 8.9–10.3)
Chloride: 99 mmol/L (ref 98–111)
Creatinine, Ser: 1.74 mg/dL — ABNORMAL HIGH (ref 0.44–1.00)
GFR, Estimated: 30 mL/min — ABNORMAL LOW (ref >60)
Glucose, Bld: 129 mg/dL — ABNORMAL HIGH (ref 70–99)
Potassium: 3.6 mmol/L (ref 3.5–5.1)
Sodium: 140 mmol/L (ref 135–145)

## 2023-03-27 MED ORDER — PNEUMOCOCCAL 20-VAL CONJ VACC 0.5 ML IM SUSY
0.5000 mL | PREFILLED_SYRINGE | INTRAMUSCULAR | Status: DC
  Filled 2023-03-27: qty 0.5

## 2023-03-27 MED ORDER — MEDIHONEY WOUND/BURN DRESSING EX PSTE
1.0000 | PASTE | Freq: Every day | CUTANEOUS | Status: DC
  Administered 2023-03-27 – 2023-03-28 (×2): 1 via TOPICAL
  Filled 2023-03-27: qty 44

## 2023-03-27 MED ORDER — INFLUENZA VAC A&B SURF ANT ADJ 0.5 ML IM SUSY
0.5000 mL | PREFILLED_SYRINGE | INTRAMUSCULAR | Status: DC
  Filled 2023-03-27: qty 0.5

## 2023-03-27 NOTE — [place] FL2 (Addendum)
[place] MEDICAID FL2 LEVEL OF CARE FORM     IDENTIFICATION  Patient Name: Chelsea [name] Birthdate: [birth year] Sex: female Admission Date (Current Location): 03/26/2023  [facility] and [place] Number:  Chiropodist and Address:  [facility] [place] [facility], [address] [place], [place] [address]      Provider Number: [id number]  Attending Physician Name and Address:  Mercie Eon, MD  Relative Name and Phone Number:       Current Level of Care: Hospital Recommended Level of Care: Skilled Nursing Facility Prior Approval Number:    Date Approved/Denied:   PASRR Number: [id number]  Discharge Plan: SNF    Current Diagnoses: Patient Active Problem List   Diagnosis Date Noted   Pre-syncope 03/26/2023   Edema of left lower leg 11/16/2022   Incontinence of urine in female 09/24/2022   Anemia of chronic disease 10/23/2019   CKD (chronic kidney disease) stage 3, GFR 30-59 ml/min (HCC)    History of stroke 10/06/2017   Depression 08/28/2013   Primary localized osteoarthritis of knees, bilateral 11/08/2011   Healthcare maintenance 08/15/2010   Physical deconditioning 02/02/2010   Hyperlipidemia associated with type 2 diabetes mellitus (HCC) 12/26/2005   Hypertension associated with diabetes (HCC) 12/26/2005   Type 2 diabetes mellitus with diabetic retinopathy (HCC) 02/12/1993    Orientation RESPIRATION BLADDER Height & Weight     Place, Self  Normal Incontinent Weight: 163 lb (73.9 kg) Height:  5\' 5"  (165.1 cm)  BEHAVIORAL SYMPTOMS/MOOD NEUROLOGICAL BOWEL NUTRITION STATUS      Incontinent Diet (Regular Room Service)  AMBULATORY STATUS COMMUNICATION OF NEEDS Skin   Total Care Verbally Bruising (Pressure Injury, Heel Posterior Right Foam Dressing)                       Personal Care Assistance Level of Assistance  Bathing, Dressing Bathing Assistance: Limited assistance   Dressing Assistance: Limited assistance     Functional Limitations  Info  Sight Sight Info: Impaired        SPECIAL CARE FACTORS FREQUENCY  PT (By licensed PT), OT (By licensed OT)     PT Frequency: 5x/week OT Frequency: 5x/week            Contractures      Additional Factors Info  Code Status, Allergies Code Status Info: Full Allergies Info: Ace Inhibitors, Aldactone (Spironolactone), Angiotensin Receptor Blockers, Atarax (Hydroxyzine), Nsaids, Penicillins, Zestril (Lisinopril)           Current Medications (03/27/2023):  This is the current hospital active medication list Current Facility-Administered Medications  Medication Dose Route Frequency Provider Last Rate Last Admin   aspirin EC tablet 81 mg  81 mg Oral Daily Masters, Katie, DO   81 mg at 03/27/23 0832   atorvastatin (LIPITOR) tablet 80 mg  80 mg Oral Daily Masters, Katie, DO   80 mg at 03/27/23 0833   enoxaparin (LOVENOX) injection 30 mg  30 mg Subcutaneous Q24H Masters, Katie, DO   30 mg at 03/26/23 1855   ezetimibe (ZETIA) tablet 10 mg  10 mg Oral Daily Masters, Katie, DO   10 mg at 03/27/23 0832   [START ON 03/28/2023] influenza vaccine adjuvanted (FLUAD) injection 0.5 mL  0.5 mL Intramuscular Tomorrow-1000 Mercie Eon, MD       leptospermum manuka honey (MEDIHONEY) paste 1 Application  1 Application Topical Daily Mercie Eon, MD   1 Application at 03/27/23 0846   [START ON 03/28/2023] pneumococcal 20-valent conjugate vaccine (PREVNAR  20) injection 0.5 mL  0.5 mL Intramuscular Tomorrow-1000 Mercie Eon, MD       sodium bicarbonate tablet 650 mg  650 mg Oral TID Masters, Katie, DO   650 mg at 03/27/23 2841     Discharge Medications: Please see discharge summary for a list of discharge medications.  Relevant Imaging Results:  Relevant Lab Results:   Additional Information SSN: [id number]  Antion Q Laural Benes, Student-Social Work

## 2023-03-27 NOTE — Consult Note (Addendum)
WOC Nurse Consult Note: Reason for Consult: Consult requested for right heel.  Performed remotely after review of progress notes and photos in the EMR.  Right heel with Unstageable pressure injury, soft eschar with loose peeling skin to wound edges, mod amt tan drainage. X-ray did not indicate osteomyelitis. Wound was noted as present on admission, according to the bedside nurses' wound care flow sheet, 5.3X9.4cm Pressure Injury POA: Yes Dressing procedure/placement/frequency: Prevalon boot ordered to reduce pressure to the affected area. Topical treatment orders provided for bedside nurses to perform as follows to assist with removal of nonviable tissue: Apply Medihoney to right heel Q day, then cover with foam dressing.  Change foam dressing Q 3 days or PRN soiling. Please re-consult if further assistance is needed.  Thank-you,  Cammie Mcgee MSN, RN, CWOCN, [place], CNS [phone]

## 2023-03-27 NOTE — Evaluation (Signed)
Occupational Therapy Evaluation Patient Details Name: Chelsea [name] MRN: [id number] DOB: December 24, 1950 Today's Date: 03/27/2023   History of Present Illness   History of Present Illness: Chelsea [name] is a 73 y.o female presenting with generalized decline for 2 months and L leg and R heel pain. PMHx includes HTN, DM, HLD, CVA, CKD, cataracts (bilateral), Insomnia, chronic pain syndrome (knee, back)     Clinical Impressions Chelsea [name] was evaluated s/p the above admission list. Per chart, she lives with her son and has had a general functional decline over the past 2 months. Pt states she does not get OOB to the [facility] daily and has assist for all ADLs and transfers at baseline. Upon evaluation the pt was limited by impaired cognition, generalized weakness, report of dizziness/nausea, limited sitting balance with posterior bias and zero standing balance. Overall she needed max-total A for bed mobility, min-max for sitting balance and total A +2 for standing attempt. Due to the deficits listed below the pt also needs up to total A fro LB ADLs at bed level and mod A for UB ADLs. Pt's BP was soft but stable throughout, no nystagmus noted with positional changes. Recommend prevalon boots to prevent further heel breakdown. Pt will benefit from continued acute OT services and skilled inpatient follow up therapy, <3 hours/day - Attempted to call family with no answer - if family can take pt home with 24/7 dependent assist, then she could d/c home with HHOT and DME.      If plan is discharge home, recommend the following:   Two people to help with walking and/or transfers;A lot of help with walking and/or transfers;A lot of help with bathing/dressing/bathroom;Two people to help with bathing/dressing/bathroom;Assistance with cooking/housework;Assistance with feeding;Direct supervision/assist for medications management;Direct supervision/assist for financial management;Assist for transportation;Help with stairs or  ramp for entrance     Functional Status Assessment   Patient has had a recent decline in their functional status and/or demonstrates limited ability to make significant improvements in function in a reasonable and predictable amount of time     Equipment Recommendations   Hospital bed;Hoyer lift      Precautions/Restrictions   Precautions Precautions: Fall Restrictions Weight Bearing Restrictions Per Provider Order: No     Mobility Bed Mobility Overal bed mobility: Needs Assistance Bed Mobility: Rolling, Sidelying to Sit, Sit to Supine Rolling: Max assist, +2 for physical assistance, +2 for safety/equipment Sidelying to sit: Total assist, +2 for physical assistance, +2 for safety/equipment   Sit to supine: Total assist, +2 for physical assistance, +2 for safety/equipment   General bed mobility comments: able to initiate and assist minimally with rolling when given cues, total A to come to sitting and to return supine    Transfers Overall transfer level: Needs assistance Equipment used: 2 person hand held assist Transfers: Sit to/from Stand Sit to Stand: Total assist, +2 physical assistance, +2 safety/equipment           General transfer comment: unable to obtain full upright posture      Balance Overall balance assessment: Needs assistance Sitting-balance support: Feet supported Sitting balance-Leahy Scale: Poor Sitting balance - Comments: min-max A Postural control: Posterior lean Standing balance support: Bilateral upper extremity supported Standing balance-Leahy Scale: Zero                             ADL either performed or assessed with clinical judgement   ADL Overall ADL's : Needs assistance/impaired Eating/Feeding: Set  up;Sitting   Grooming: Set up;Bed level   Upper Body Bathing: Bed level;Moderate assistance   Lower Body Bathing: +2 for physical assistance;Bed level   Upper Body Dressing : Moderate assistance;Sitting    Lower Body Dressing: Total assistance;+2 for physical assistance;+2 for safety/equipment;Sit to/from stand   Toilet Transfer: Total assistance   Toileting- Clothing Manipulation and Hygiene: Total assistance;+2 for physical assistance;+2 for safety/equipment;Bed level       Functional mobility during ADLs: Total assistance;+2 for physical assistance;+2 for safety/equipment       Vision Baseline Vision/History: 0 No visual deficits Vision Assessment?: No apparent visual deficits Additional Comments: did not formally assess     Perception Perception: Not tested       Praxis Praxis: Not tested       Pertinent Vitals/Pain Pain Assessment Pain Assessment: Faces Faces Pain Scale: Hurts little more Pain Location: R arm with bed mobility Pain Descriptors / Indicators: Discomfort Pain Intervention(s): Limited activity within patient's tolerance, Monitored during session     Extremity/Trunk Assessment Upper Extremity Assessment Upper Extremity Assessment: RUE deficits/detail;LUE deficits/detail;Left hand dominant RUE Deficits / Details: globally weak, able to reach arm above head RUE Coordination: decreased gross motor LUE Deficits / Details: globally weak, increased time and effort needed for over head ROM. unable to obtain full digit extension. pt reports she is able to feed herself with her dominant L hand LUE Coordination: decreased fine motor;decreased gross motor   Lower Extremity Assessment Lower Extremity Assessment: Defer to PT evaluation   Cervical / Trunk Assessment Cervical / Trunk Assessment: Kyphotic   Communication Communication Communication: No apparent difficulties (difficult to understand at times)   Cognition Arousal: Alert Behavior During Therapy: [facility] for tasks assessed/performed Cognition: No family/caregiver present to determine baseline             OT - Cognition Comments: questionable historian, reporting conflicting PLOF at times. followed  most simple 1 step commands with increased time. Limited insight                 Following commands: Impaired Following commands impaired: Follows one step commands with increased time                Home Living Family/patient expects to be discharged to:: Private residence Living Arrangements: Children Available Help at Discharge: Family;Available 24 hours/day;Personal care attendant Type of Home: House Home Access: Stairs to enter [employer] of Steps: 4 Entrance Stairs-Rails: Right Home Layout: One level     Bathroom Shower/Tub: Tub/shower unit;Sponge bathes at baseline     Bathroom Accessibility: No   Home Equipment: Grab bars - tub/shower;Rolling Walker (2 wheels);Wheelchair - manual   Additional Comments: aide there everyother day, assists with ADLs      Prior Functioning/Environment Prior Level of Function : Independent/Modified Independent;Patient poor historian/Family not available             Mobility Comments: in WC when OOB, family assists wtih transfers. Pt stated she does not get OOB everyday ADLs Comments: wears briefs for toileting, has assist for hygiene. Anticipate max-total A for ADLs    OT Problem List: Decreased strength;Decreased activity tolerance;Decreased range of motion;Decreased coordination;Decreased safety awareness;Decreased knowledge of use of DME or AE;Decreased knowledge of precautions   OT Treatment/Interventions: Self-care/ADL training;Therapeutic exercise;DME and/or AE instruction;Therapeutic activities;Patient/family education;Balance training      OT Goals(Current goals can be found in the care plan section)   Acute Rehab OT Goals Patient Stated Goal: to go home OT Goal Formulation: With patient Time  For Goal Achievement: 04/10/23 Potential to Achieve Goals: Fair ADL Goals Pt Will Perform Grooming: with set-up;sitting Pt Will Perform Upper Body Dressing: with set-up;sitting Additional ADL Goal #1: Pt  will complete bed mobility with min A as a precursor to ADLs Additional ADL Goal #2: Pt will tolerance at least 10 minutes of unsupported sitting with fair balance as a precursor to ADLs   OT Frequency:  Min 1X/week    Co-evaluation PT/OT/SLP Co-Evaluation/Treatment: Yes Reason for Co-Treatment: Complexity of the patient's impairments (multi-system involvement);To address functional/ADL transfers;For patient/therapist safety   OT goals addressed during session: ADL's and self-care      AM-PAC OT "6 Clicks" Daily Activity     Outcome Measure Help from another person eating meals?: None Help from another person taking care of personal grooming?: A Little Help from another person toileting, which includes using toliet, bedpan, or urinal?: Total Help from another person bathing (including washing, rinsing, drying)?: A Lot Help from another person to put on and taking off regular upper body clothing?: A Lot Help from another person to put on and taking off regular lower body clothing?: Total 6 Click Score: 13   End of Session Equipment Utilized During Treatment: Gait belt Nurse Communication: Mobility status (pt needs prevalon boots)  Activity Tolerance: Patient tolerated treatment well Patient left: in bed;with call bell/phone within reach;with bed alarm set  OT Visit Diagnosis: Unsteadiness on feet (R26.81);Other abnormalities of gait and mobility (R26.89);Muscle weakness (generalized) (M62.81)                Time: 4696-2952 OT Time Calculation (min): 35 min Charges:  OT General Charges $OT Visit: 1 Visit OT Evaluation $OT Eval Moderate Complexity: 1 Mod  Derenda Mis, OTR/L Acute Rehabilitation Services Office [phone] Secure Chat Communication Preferred   Donia Pounds 03/27/2023, 10:53 AM

## 2023-03-27 NOTE — Care Management Obs Status (Signed)
MEDICARE OBSERVATION STATUS NOTIFICATION   Patient Details  Name: Chelsea [name] MRN: [id number] Date of Birth: Mar 29, 1950   Medicare Observation Status Notification Given:  Yes    Baldemar Lenis, [place] 03/27/2023, 3:23 PM

## 2023-03-27 NOTE — Progress Notes (Signed)
HD#0 SUBJECTIVE:  Patient Summary: Chelsea [name] is a 73 year old with PMH of CVA, CKD, hypertension, and hyperlipidemia presenting to the ED with a 2-month history of progressive generalized weakness.   Overnight Events: NAEO  Interim History: Patient was evaluated bedside with daughter nearby.  Further discussed progression of deconditioning that led to hospitalization.  She endorses some pain in her legs and calves upon standing but denies any changes in mood or recent stressful events.  Patient and family were encouraged to discuss outpatient goals of skilled nursing facility versus home physical therapy for strength improvement.  OBJECTIVE:  Vital Signs: Vitals:   03/27/23 0800 03/27/23 0831 03/27/23 0919 03/27/23 1106  BP: 133/66 128/72    Pulse: 97     Resp:      Temp:      TempSrc:      SpO2:      Weight:   76.1 kg 73.9 kg  Height:    5\' 5"  (1.651 m)   Supplemental O2: Room Air SpO2: 100 %  Filed Weights   03/27/23 0919 03/27/23 1106  Weight: 76.1 kg 73.9 kg     Intake/Output Summary (Last 24 hours) at 03/27/2023 1142 Last data filed at 03/27/2023 0336 Gross per 24 hour  Intake --  Output 475 ml  Net -475 ml   Net IO Since Admission: -475 mL [03/27/23 1142]  CBC    Component Value Date/Time   WBC 5.3 03/27/2023 0554   RBC 4.01 03/27/2023 0554   HGB 10.8 (L) 03/27/2023 0554   HGB 11.0 (L) 09/24/2022 1107   HCT 34.3 (L) 03/27/2023 0554   HCT 35.4 09/24/2022 1107   PLT 317 03/27/2023 0554   PLT 248 09/24/2022 1107   MCV 85.5 03/27/2023 0554   MCV 86 09/24/2022 1107   MCH 26.9 03/27/2023 0554   MCHC 31.5 03/27/2023 0554   RDW 15.9 (H) 03/27/2023 0554   RDW 16.0 (H) 09/24/2022 1107   LYMPHSABS 2.0 03/26/2023 1529   LYMPHSABS 2.0 10/26/2020 1007   MONOABS 0.4 03/26/2023 1529   EOSABS 0.3 03/26/2023 1529   EOSABS 0.5 (H) 10/26/2020 1007   BASOSABS 0.0 03/26/2023 1529   BASOSABS 0.1 10/26/2020 1007   CMP     Component Value Date/Time   NA 140  03/27/2023 0554   NA 141 09/24/2022 1107   K 3.6 03/27/2023 0554   CL 99 03/27/2023 0554   CO2 25 03/27/2023 0554   GLUCOSE 129 (H) 03/27/2023 0554   BUN 48 (H) 03/27/2023 0554   BUN 52 (H) 09/24/2022 1107   CREATININE 1.74 (H) 03/27/2023 0554   CREATININE 1.05 12/25/2013 1551   CALCIUM 9.2 03/27/2023 0554   PROT 6.9 03/26/2023 1529   ALBUMIN 2.5 (L) 03/26/2023 1529   ALBUMIN 3.9 03/02/2021 0938   AST 12 (L) 03/26/2023 1529   ALT 8 03/26/2023 1529   ALKPHOS 57 03/26/2023 1529   BILITOT 0.6 03/26/2023 1529   GFR 32.98 (L) 07/31/2021 1056   EGFR 30 (L) 09/24/2022 1107   GFRNONAA 30 (L) 03/27/2023 0554   GFRNONAA 56 (L) 12/25/2013 1551   Physical Exam: Physical Exam Constitutional:      Appearance: Normal appearance.  HENT:     Head: Normocephalic and atraumatic.  Neurological:     Mental Status: She is alert.  Psychiatric:        Mood and Affect: Mood normal.        Behavior: Behavior normal.   ASSESSMENT/PLAN:  Assessment: Principal Problem:   Pre-syncope  Plan: #Physical Deconditioning #Presyncope Patient was evaluated by physical therapy and occupational therapy who are recommending SNF for improvements in strength.  There still has been some difficulties with collecting her orthostatic vitals.  This morning, we discussed SNF versus home health physical therapy and Occupational Therapy.  Family is agreeable to SNF so we will coordinate with TOC.  Will also address reversible causes such as too many blood pressure medications.  She will likely only require 1 medication at discharge, but we will continue to hold her carvedilol, olmesartan, amlodipine, and chlorthalidone.  She will likely be discharged on her olmesartan.  Unlikely to be a cardiac cause for her syncopal or presyncopal episodes so we will hold off on echo. - Follow-up with TOC for SNF placement - Continue to hold antihypertensives (if persistently elevated blood pressure, resume olmesartan but hold other  medications)  # AKI, resolved Creatinine improved to 1.74 this morning which is her baseline.  Will continue to monitor BMP and encourage hydration  Chronic Conditions #CVA in 2022: Continue home aspirin #Hyperlipidemia: Continue home Zetia 10 mg atorvastatin 80 mg daily #Normocytic anemia: Hemoglobin of 9.3 on admission is roughly patient's baseline.  Will continue to trend CBC and monitor for acute signs of bleeding #Hypertension: Hold home amlodipine 10 mg daily, carvedilol 25 mg twice daily, and hydrochlorothiazide 12.5 mg daily. #Type 2 diabetes: Last A1c on 11/14/2022 was 6.5.  Will hold Victoza 1.8 mg daily  Best Practice: Diet: Regular diet VTE: enoxaparin (LOVENOX) injection 30 mg Start: 03/26/23 1800 Code: Full AB: None Therapy Recs: SNF, DISPO: Anticipated discharge  TBD  to Skilled nursing facility pending  bed availability .  Signature: Morrie Sheldon, MD Internal Medicine Resident, PGY-1 [facility]  Pager: [phone]  Please contact the on call pager after 5 pm and on weekends at [phone].

## 2023-03-27 NOTE — Evaluation (Signed)
 Physical Therapy Evaluation Patient Details Name: Chelsea [name] MRN: [id number] DOB: 03/18/1950 Today's Date: 03/27/2023  History of Present Illness  Chelsea [name] is a 73 y.o female presenting with generalized decline for 2 months and L leg and R heel pain. PMHx includes HTN, DM, HLD, CVA, CKD, cataracts (bilateral), Insomnia, chronic pain syndrome (knee, back)  Clinical Impression  Pt able to provided home set up information, has more difficulty with expressing PLOF and when she was last able to get in and out of the bed on her own. Pt is globally weak and requires increased time for muscle activation and sequencing of movement, she also exhibits fear with movement. Pt is maxAx2 for rolling and totalAx2 for coming to seated EoB, initial sitting balance, and STS. Pt reports dizziness with rolling and nausea with standing and although BP reading were difficult to obtain does not appear to be grossly orthostatic. Pt likely has some underlying cognitive deficits and family has be assisting her to this point. If family is able to take her home she will likely do better, especially if family provided hoyer lift and hospital bed. Otherwise Patient will benefit from continued inpatient follow up therapy, <3 hours/day. PT will continue to follow acutely.        If plan is discharge home, recommend the following: Two people to help with walking and/or transfers;Two people to help with bathing/dressing/bathroom;Assistance with cooking/housework;Assistance with feeding;Direct supervision/assist for medications management;Direct supervision/assist for financial management;Assist for transportation;Help with stairs or ramp for entrance;Supervision due to cognitive status   Can travel by private vehicle    No    Equipment Recommendations Hospital bed;Hoyer lift;Other (comment) (ramp construction for safe entrance/exit)     Functional Status Assessment Patient has had a recent decline in their functional  status and demonstrates the ability to make significant improvements in function in a reasonable and predictable amount of time.     Precautions / Restrictions Precautions Precautions: Fall Restrictions Weight Bearing Restrictions Per Provider Order: No      Mobility  Bed Mobility Overal bed mobility: Needs Assistance Bed Mobility: Rolling, Sidelying to Sit, Sit to Supine Rolling: Max assist, +2 for physical assistance, +2 for safety/equipment Sidelying to sit: Total assist, +2 for physical assistance, +2 for safety/equipment   Sit to supine: Total assist, +2 for physical assistance, +2 for safety/equipment   General bed mobility comments: able to initiate and assist minimally with rolling when given cues, total A to come to sitting and to return supine    Transfers Overall transfer level: Needs assistance Equipment used: 2 person hand held assist Transfers: Sit to/from Stand Sit to Stand: Total assist, +2 physical assistance, +2 safety/equipment           General transfer comment: unable to obtain full upright posture        Balance Overall balance assessment: Needs assistance Sitting-balance support: Feet supported Sitting balance-Leahy Scale: Poor Sitting balance - Comments: min-max A Postural control: Posterior lean Standing balance support: Bilateral upper extremity supported Standing balance-Leahy Scale: Zero                               Pertinent Vitals/Pain Pain Assessment Pain Assessment: Faces Faces Pain Scale: Hurts little more Pain Location: R arm with bed mobility, R heel with pressure Pain Descriptors / Indicators: Discomfort Pain Intervention(s): Limited activity within patient's tolerance, Monitored during session, Repositioned, Other (comment) (Prevalon boots ordered for offweighting heels in supine)  Home Living Family/patient expects to be discharged to:: Private residence Living Arrangements: Children Available Help at  Discharge: Family;Available 24 hours/day;Personal care attendant Type of Home: House Home Access: Stairs to enter Entrance Stairs-Rails: Right Entrance Stairs-Number of Steps: 4   Home Layout: One level Home Equipment: Grab bars - tub/shower;Rolling Walker (2 wheels);Wheelchair - manual Additional Comments: aide there everyother day, assists with ADLs    Prior Function Prior Level of Function : Independent/Modified Independent;Patient poor historian/Family not available             Mobility Comments: in WC when OOB, family assists wtih transfers. Pt stated she does not get OOB everyday ADLs Comments: wears briefs for toileting, has assist for hygiene. Anticipate max-total A for ADLs     Extremity/Trunk Assessment   Upper Extremity Assessment Upper Extremity Assessment: Defer to OT evaluation RUE Deficits / Details: globally weak, able to reach arm above head RUE Coordination: decreased gross motor LUE Deficits / Details: globally weak, increased time and effort needed for over head ROM. unable to obtain full digit extension. pt reports she is able to feed herself with her dominant L hand LUE Coordination: decreased fine motor;decreased gross motor    Lower Extremity Assessment Lower Extremity Assessment: RLE deficits/detail;LLE deficits/detail RLE Deficits / Details: globally weak, increased time for muscle activation, once activated 3+/5 strength, but only able to maintain strength for 1 rep then requiring assist for movement RLE Coordination: decreased fine motor;decreased gross motor LLE Deficits / Details: globally weak, increased time for muscle activation, 2/5 strength grossly LLE Coordination: decreased fine motor;decreased gross motor    Cervical / Trunk Assessment Cervical / Trunk Assessment: Kyphotic  Communication   Communication Communication: No apparent difficulties (difficult to understand at times)    Cognition Arousal: Alert Behavior During Therapy: [facility]  for tasks assessed/performed                             Following commands: Impaired Following commands impaired: Follows one step commands with increased time     Cueing Cueing Techniques: Verbal cues, Gestural cues, Visual cues, Tactile cues     General Comments General comments (skin integrity, edema, etc.): BP did not drop with positional changes, pt reported nausea after standing attempt        Assessment/Plan    PT Assessment Patient needs continued PT services  PT Problem List Decreased strength;Decreased activity tolerance;Decreased balance;Decreased mobility;Decreased coordination;Decreased cognition;Decreased range of motion;Decreased safety awareness;Decreased skin integrity;Pain;Obesity       PT Treatment Interventions DME instruction;Gait training;Functional mobility training;Therapeutic activities;Therapeutic exercise;Balance training;Neuromuscular re-education;Cognitive remediation;Patient/family education;Wheelchair mobility training    PT Goals (Current goals can be found in the Care Plan section)  Acute Rehab PT Goals PT Goal Formulation: Patient unable to participate in goal setting Time For Goal Achievement: 04/10/23 Potential to Achieve Goals: Fair    Frequency Min 1X/week     Co-evaluation PT/OT/SLP Co-Evaluation/Treatment: Yes Reason for Co-Treatment: Complexity of the patient's impairments (multi-system involvement);To address functional/ADL transfers;For patient/therapist safety PT goals addressed during session: Mobility/safety with mobility OT goals addressed during session: ADL's and self-care       AM-PAC PT "6 Clicks" Mobility  Outcome Measure Help needed turning from your back to your side while in a flat bed without using bedrails?: Total Help needed moving from lying on your back to sitting on the side of a flat bed without using bedrails?: Total Help needed moving to and from a bed to a  chair (including a wheelchair)?:  Total Help needed standing up from a chair using your arms (e.g., wheelchair or bedside chair)?: Total Help needed to walk in hospital room?: Total Help needed climbing 3-5 steps with a railing? : Total 6 Click Score: 6    End of Session Equipment Utilized During Treatment: Gait belt Activity Tolerance: Patient limited by fatigue;Patient tolerated treatment well;Other (comment) (c/o of nausea) Patient left: in bed;with call bell/phone within reach;with bed alarm set Nurse Communication: Mobility status;Other (comment) (need for Prevalon boots) PT Visit Diagnosis: Unsteadiness on feet (R26.81);Other abnormalities of gait and mobility (R26.89);Muscle weakness (generalized) (M62.81);Difficulty in walking, not elsewhere classified (R26.2);Other symptoms and signs involving the nervous system (R29.898);Pain Pain - Right/Left: Right Pain - part of body:  (heel with pressure,)    Time: 1610-9604 PT Time Calculation (min) (ACUTE ONLY): 33 min   Charges:   PT Evaluation $PT Eval Moderate Complexity: 1 Mod   PT General Charges $$ ACUTE PT VISIT: 1 Visit         Tajay Muzzy B. Beverely Risen PT, DPT Acute Rehabilitation Services Please use secure chat or  Call Office [phone]   Elon Alas [facility] [place] 03/27/2023, 11:09 AM

## 2023-03-27 NOTE — TOC Initial Note (Signed)
Transition of Care [facility]) - Initial/Assessment Note    Patient Details  Name: Chelsea [name] MRN: [id number] Date of Birth: 06/23/1950  Transition [facility] Care [facility]) CM/SW Contact:    Baldemar Lenis, LCSW Phone Number: 03/27/2023, 4:31 PM  Clinical Narrative:       CSW met with patient's daughter, Delice Bison, at bedside to discuss recommendation for SNF. Tara in agreement, no preference for SNF. CSW provided daughter with CMS choice list. Patient will need PTAR transport, CSW answered questions about that. CSW to follow.           UPDATE: CSW received call back from daughter, Delice Bison, choosing [facility] for SNF. CSW to follow.   Expected Discharge Plan: Skilled Nursing Facility Barriers to Discharge: Continued Medical Work up, English as a second language teacher   Patient Goals and CMS Choice Patient states their goals for this hospitalization and ongoing recovery are:: patient unable to participate in goal setting, not fully oriented CMS Medicare.gov Compare Post Acute Care list provided to:: Patient Represenative (must comment) Choice offered to / list presented to : Adult Children [place] ownership interest in [facility].provided to:: Adult Children    Expected Discharge Plan and Services     Post Acute Care Choice: Skilled Nursing Facility Living arrangements for the past 2 months: Single Family Home                                      Prior Living Arrangements/Services Living arrangements for the past 2 months: Single Family Home Lives with:: Adult Children Patient language and need for interpreter reviewed:: No Do you feel safe going back to the place where you live?: Yes      Need for Family Participation in Patient Care: Yes (Comment) Care giver support system in place?: Yes (comment)   Criminal Activity/Legal Involvement Pertinent to Current Situation/Hospitalization: No - Comment as needed  Activities of Daily Living   ADL Screening (condition at time of  admission) Independently performs ADLs?: No Does the patient have a NEW difficulty with bathing/dressing/toileting/self-feeding that is expected to last >3 days?: No Does the patient have a NEW difficulty with getting in/out of bed, walking, or climbing stairs that is expected to last >3 days?: No Does the patient have a NEW difficulty with communication that is expected to last >3 days?: No Is the patient deaf or have difficulty hearing?: No Does the patient have difficulty seeing, even when wearing glasses/contacts?: Yes Does the patient have difficulty concentrating, remembering, or making decisions?: Yes  Permission Sought/Granted Permission sought to share information with : Facility Medical sales representative, Family Supports Permission granted to share information with : Yes, Verbal Permission Granted  Share Information with NAME: Delice Bison  Permission granted to share info w AGENCY: SNF  Permission granted to share info w Relationship: Daughter     Emotional Assessment Appearance:: Appears stated age Attitude/Demeanor/Rapport: Unable to Assess Affect (typically observed): Unable to Assess Orientation: : Oriented to Self, Oriented to Place Alcohol / Substance Use: Not Applicable Psych Involvement: No (comment)  Admission diagnosis:  Pre-syncope [R55] Generalized weakness [R53.1] Patient Active Problem List   Diagnosis Date Noted   Pre-syncope 03/26/2023   Edema of left lower leg 11/16/2022   Incontinence of urine in female 09/24/2022   Anemia of chronic disease 10/23/2019   CKD (chronic kidney disease) stage 3, GFR 30-59 ml/min (HCC)    History of stroke 10/06/2017   Depression 08/28/2013  Primary localized osteoarthritis of knees, bilateral 11/08/2011   Healthcare maintenance 08/15/2010   Physical deconditioning 02/02/2010   Hyperlipidemia associated with type 2 diabetes mellitus (HCC) 12/26/2005   Hypertension associated with diabetes (HCC) 12/26/2005   Type 2 diabetes  mellitus with diabetic retinopathy (HCC) 02/12/1993   PCP:  Champ Mungo, DO Pharmacy:   [facility] - [place], [place] - [address] [place] [address] Phone: [phone] Fax: [phone]     Social Drivers of Health (SDOH) Social History: SDOH Screenings   Food Insecurity: No Food Insecurity (03/27/2023)  Housing: Low Risk  (03/27/2023)  Transportation Needs: Unmet Transportation Needs (03/27/2023)  Utilities: Not At Risk (03/27/2023)  Depression (PHQ2-9): Low Risk  (11/16/2022)  Physical Activity: Inactive (02/17/2021)  Social Connections: Moderately Integrated (03/27/2023)  Tobacco Use: Low Risk  (03/26/2023)   SDOH Interventions:     Readmission Risk Interventions     No data to display

## 2023-03-28 ENCOUNTER — Telehealth: Payer: Self-pay | Admitting: Internal Medicine

## 2023-03-28 DIAGNOSIS — D638 Anemia in other chronic diseases classified elsewhere: Secondary | ICD-10-CM | POA: Diagnosis not present

## 2023-03-28 DIAGNOSIS — E1169 Type 2 diabetes mellitus with other specified complication: Secondary | ICD-10-CM | POA: Diagnosis not present

## 2023-03-28 DIAGNOSIS — Z79899 Other long term (current) drug therapy: Secondary | ICD-10-CM | POA: Diagnosis not present

## 2023-03-28 DIAGNOSIS — E119 Type 2 diabetes mellitus without complications: Secondary | ICD-10-CM | POA: Diagnosis not present

## 2023-03-28 DIAGNOSIS — Z7982 Long term (current) use of aspirin: Secondary | ICD-10-CM | POA: Diagnosis not present

## 2023-03-28 DIAGNOSIS — Z8673 Personal history of transient ischemic attack (TIA), and cerebral infarction without residual deficits: Secondary | ICD-10-CM | POA: Diagnosis not present

## 2023-03-28 DIAGNOSIS — I129 Hypertensive chronic kidney disease with stage 1 through stage 4 chronic kidney disease, or unspecified chronic kidney disease: Secondary | ICD-10-CM | POA: Diagnosis not present

## 2023-03-28 DIAGNOSIS — D649 Anemia, unspecified: Secondary | ICD-10-CM | POA: Diagnosis not present

## 2023-03-28 DIAGNOSIS — N183 Chronic kidney disease, stage 3 unspecified: Secondary | ICD-10-CM | POA: Diagnosis not present

## 2023-03-28 DIAGNOSIS — L89610 Pressure ulcer of right heel, unstageable: Secondary | ICD-10-CM | POA: Diagnosis not present

## 2023-03-28 DIAGNOSIS — E1159 Type 2 diabetes mellitus with other circulatory complications: Secondary | ICD-10-CM | POA: Diagnosis not present

## 2023-03-28 DIAGNOSIS — L89616 Pressure-induced deep tissue damage of right heel: Secondary | ICD-10-CM | POA: Diagnosis not present

## 2023-03-28 DIAGNOSIS — J45909 Unspecified asthma, uncomplicated: Secondary | ICD-10-CM | POA: Diagnosis not present

## 2023-03-28 DIAGNOSIS — M6281 Muscle weakness (generalized): Secondary | ICD-10-CM | POA: Diagnosis not present

## 2023-03-28 DIAGNOSIS — I152 Hypertension secondary to endocrine disorders: Secondary | ICD-10-CM | POA: Diagnosis not present

## 2023-03-28 DIAGNOSIS — E11319 Type 2 diabetes mellitus with unspecified diabetic retinopathy without macular edema: Secondary | ICD-10-CM | POA: Diagnosis not present

## 2023-03-28 DIAGNOSIS — R1312 Dysphagia, oropharyngeal phase: Secondary | ICD-10-CM | POA: Diagnosis not present

## 2023-03-28 DIAGNOSIS — M79661 Pain in right lower leg: Secondary | ICD-10-CM | POA: Diagnosis not present

## 2023-03-28 DIAGNOSIS — R55 Syncope and collapse: Secondary | ICD-10-CM | POA: Diagnosis not present

## 2023-03-28 DIAGNOSIS — E1122 Type 2 diabetes mellitus with diabetic chronic kidney disease: Secondary | ICD-10-CM | POA: Diagnosis not present

## 2023-03-28 DIAGNOSIS — Z7401 Bed confinement status: Secondary | ICD-10-CM | POA: Diagnosis not present

## 2023-03-28 DIAGNOSIS — R6889 Other general symptoms and signs: Secondary | ICD-10-CM | POA: Diagnosis not present

## 2023-03-28 DIAGNOSIS — N179 Acute kidney failure, unspecified: Secondary | ICD-10-CM | POA: Diagnosis not present

## 2023-03-28 DIAGNOSIS — E785 Hyperlipidemia, unspecified: Secondary | ICD-10-CM | POA: Diagnosis not present

## 2023-03-28 DIAGNOSIS — M170 Bilateral primary osteoarthritis of knee: Secondary | ICD-10-CM | POA: Diagnosis not present

## 2023-03-28 DIAGNOSIS — Z743 Need for continuous supervision: Secondary | ICD-10-CM | POA: Diagnosis not present

## 2023-03-28 DIAGNOSIS — I6930 Unspecified sequelae of cerebral infarction: Secondary | ICD-10-CM | POA: Diagnosis not present

## 2023-03-28 DIAGNOSIS — I639 Cerebral infarction, unspecified: Secondary | ICD-10-CM | POA: Diagnosis not present

## 2023-03-28 DIAGNOSIS — N189 Chronic kidney disease, unspecified: Secondary | ICD-10-CM | POA: Diagnosis not present

## 2023-03-28 LAB — BASIC METABOLIC PANEL
Anion gap: 17 — ABNORMAL HIGH (ref 5–15)
BUN: 37 mg/dL — ABNORMAL HIGH (ref 8–23)
CO2: 25 mmol/L (ref 22–32)
Calcium: 9.4 mg/dL (ref 8.9–10.3)
Chloride: 100 mmol/L (ref 98–111)
Creatinine, Ser: 1.33 mg/dL — ABNORMAL HIGH (ref 0.44–1.00)
GFR, Estimated: 42 mL/min — ABNORMAL LOW (ref >60)
Glucose, Bld: 119 mg/dL — ABNORMAL HIGH (ref 70–99)
Potassium: 3.6 mmol/L (ref 3.5–5.1)
Sodium: 142 mmol/L (ref 135–145)

## 2023-03-28 MED ORDER — ADULT MULTIVITAMIN W/MINERALS CH
1.0000 | ORAL_TABLET | Freq: Every day | ORAL | Status: DC
  Administered 2023-03-28: 1 via ORAL
  Filled 2023-03-28: qty 1

## 2023-03-28 MED ORDER — ENOXAPARIN SODIUM 40 MG/0.4ML IJ SOSY: 40.0000 mg | PREFILLED_SYRINGE | INTRAMUSCULAR | Status: DC

## 2023-03-28 MED ORDER — PROSOURCE PLUS PO LIQD
30.0000 mL | Freq: Two times a day (BID) | ORAL | Status: DC
  Administered 2023-03-28: 30 mL via ORAL
  Filled 2023-03-28: qty 30

## 2023-03-28 MED ORDER — ENSURE ENLIVE PO LIQD
237.0000 mL | Freq: Three times a day (TID) | ORAL | Status: DC
  Administered 2023-03-28: 237 mL via ORAL

## 2023-03-28 NOTE — Telephone Encounter (Signed)
Please call back about the following medication.  Pt is currently inpatient. Per the pharmacy the daughter has called asking for a change in the following medication   carvedilol (COREG) 25 MG tablet  [facility] - [place],  - [address]

## 2023-03-28 NOTE — Discharge Summary (Addendum)
 Name: Chelsea [name] MRN: [id number] DOB: Dec 29, 1950 73 y.o. PCP: Champ Mungo, DO  Date of Admission: 03/26/2023 12:26 PM Date of Discharge:  03/28/23 Attending Physician: Dr.  Lafonda Mosses  DISCHARGE DIAGNOSIS:  Primary Problem: Pre-syncope   Hospital Problems: Principal Problem:   Pre-syncope    DISCHARGE MEDICATIONS:   Allergies as of 03/28/2023       Reactions   Ace Inhibitors Other (See Comments)   Severe hyperkalemia Jan 2023 from [facility], assumed ACEi would have same effect   Aldactone [spironolactone] Other (See Comments)   Severe hyperkalemia Jan 2023   Angiotensin Receptor Blockers Other (See Comments)   Severe hyperkalemia Jan 2023   Atarax [hydroxyzine] Other (See Comments)   Hallucinations   Nsaids Nausea And Vomiting   Penicillins Other (See Comments)   tremors   Zestril [lisinopril] Cough        Medication List     PAUSE taking these medications    olmesartan 20 MG tablet Wait to take this until your doctor or other care provider tells you to start again. Commonly known as: BENICAR Take 20 mg by mouth in the morning.       STOP taking these medications    amLODipine 10 MG tablet Commonly known as: NORVASC   carvedilol 25 MG tablet Commonly known as: COREG   chlorthalidone 25 MG tablet Commonly known as: HYGROTON   liraglutide 18 MG/3ML Sopn Commonly known as: VICTOZA       TAKE these medications    Accu-Chek Guide test strip Generic drug: glucose blood Use to check blood sugar two times per day.   albuterol 108 (90 Base) MCG/ACT inhaler Commonly known as: Ventolin HFA INHALE 2 PUFFS into lungs EVERY 6 HOURS AS NEEDED FOR WHEEZING OR SHORTNESS OF BREATH   Aspirin EC Adult Low Dose 81 MG tablet Generic drug: aspirin EC TAKE 1 TABLET BY MOUTH EVERY DAY What changed: when to take this   atorvastatin 80 MG tablet Commonly known as: LIPITOR Take 1 tablet (80 mg total) by mouth daily. What changed: when to take this   ezetimibe 10  MG tablet Commonly known as: ZETIA TAKE 1 TABLET BY MOUTH EVERY DAY What changed: when to take this   fluticasone 50 MCG/ACT nasal spray Commonly known as: FLONASE USE 2 SPRAYS IN EACH NOSTRIL EVERY DAY   Pen Needles 32G X 4 MM Misc 1 each by Does not apply route daily in the afternoon. Use to inject victoza once daily.   sodium bicarbonate 650 MG tablet TAKE 1 TABLET BY MOUTH 3 TIMES DAILY   traZODone 50 MG tablet Commonly known as: DESYREL TAKE 1/2 TABLET TO 1 BY MOUTH AT BEDTIME               Discharge Care Instructions  (From admission, onward)           Start     Ordered   03/28/23 0000  Discharge wound care:       Comments: Apply Medihoney to right heel Q day, then cover with foam dressing.  Change foam dressing Q 3 days or PRN soiling   03/28/23 1335            DISPOSITION AND FOLLOW-UP:  Chelsea [name] was discharged from [facility] in Stable condition. At the hospital follow up visit please address:  Follow-up Recommendations: Consults: None Labs: Basic Metabolic Profile Studies: None Medications: Olmesartan   Follow-up Appointments: Manuela Neptune, MD - [facility]  04/18/23 at 10:15  AM   Contact information for follow-up providers     Hassan Rowan, [place], MD. Go on 04/18/2023.   Specialty: Internal Medicine Why: 10:15 AM Contact information: [address] [place] [address] [phone]              Contact information for after-discharge care     Destination     [facility] Preferred SNF .   Service: Skilled Nursing Contact information: [address] [place] [address] [phone]                    HOSPITAL COURSE:  Patient Summary: #Presyncope #Physical Deconditioning Patient presented with 60-month history of progressive weakness with declining mental status. On Sunday prior to hospitalization, daughter reports an episode of  decreased responsiveness and lightheadedness after moving the patient from her bed to a chair that was likely presyncopal in nature.  Valvular etiology unlikely as patient had poor p.o. intake in preceding months.  Given persistently low blood pressures, antihypertensive medications were also a possible etiology, so these were held.  If patient demonstrates persistently elevated blood pressures after discharge, it may be appropriate to resume her olmesartan.  Patient and her family discussed SNF versus home health physical therapy and occupational therapy for improved strength, and they decided to pursue SNF placement.  #AKI Likely in the context of poor p.o. intake over the past 2 months prior to admission.  Baseline is roughly 1.8.  On admission, creatinine was 2.22.  Resolved after IV fluids. - Follow-up BMP outpatient   #Right heel ulceration Per family, likely secondary from fall in January.  Right foot x-ray ruled out osteomyelitis.  No leukocytosis on CBC.  Will continue appropriate wound care outpatient.  #CVA in 2022 Continued home aspirin  #Hyperlipidemia  Continued home Zetia 10 mg and atorvastatin 80 mg daily  #Normocytic anemia Hemoglobin of 9.3 on admission is roughly patient's baseline.  Will continue to trend CBC and monitor for acute signs of bleeding  #Hypertension  Held home amlodipine 10 mg daily, carvedilol 25 mg twice daily, olmesartan 20 mg, and chlorthalidone 25 mg daily.  Will continue to hold these medications at discharge as persistent hypotension noted on initial admission may have been contributing to patient's lethargy.  If her blood pressure remains elevated, it may be appropriate to resume olmesartan 20 mg daily.  #Type 2 diabetes  Last A1c on 11/14/2022 was 6.5.  Will hold Victoza 1.8 mg daily.  Follow-up outpatient.   DISCHARGE INSTRUCTIONS:   Discharge Instructions     Call MD for:  difficulty breathing, headache or visual disturbances   Complete by: As  directed    Call MD for:  extreme fatigue   Complete by: As directed    Call MD for:  hives   Complete by: As directed    Call MD for:  persistant dizziness or light-headedness   Complete by: As directed    Call MD for:  persistant nausea and vomiting   Complete by: As directed    Call MD for:  redness, tenderness, or signs of infection (pain, swelling, redness, odor or green/yellow discharge around incision site)   Complete by: As directed    Call MD for:  severe uncontrolled pain   Complete by: As directed    Call MD for:  temperature >100.4   Complete by: As directed    Diet - low sodium heart healthy   Complete by: As directed    Discharge instructions   Complete  by: As directed    Ms. Kimble,  You were hospitalized for weakness and an acute kidney injury.  The cause of your weakness during this time is unknown but could be from your decreased appetite and oral intake and your blood pressure medications.  We have held some of your blood pressure medications as we feel that this was leading to her low blood pressure.  You will be going to a skilled nursing facility to regain your strength in the meantime.  Please *STOP* the following medications:  - Amlodipine 10 mg daily - Coreg 25 mg daily - Chlorthalidone 25 mg daily - Olmesartan 20 mg daily - Victoza   Please continue taking your other medications as needed.  You are scheduled for a hospital follow-up visit at [facility] on 04/18/23 at 10:15 AM. We are glad that you are feeling better!   Discharge wound care:   Complete by: As directed    Apply Medihoney to right heel Q day, then cover with foam dressing.  Change foam dressing Q 3 days or PRN soiling   Increase activity slowly   Complete by: As directed        SUBJECTIVE:  Patient was evaluated at bedside.  She was comfortably eating fruit.  She denies any nausea, vomiting, abdominal pain, or any other signs or symptoms.  Patient was informed of likely disposition to a SNF  and that we would keep her daughter informed.  No other concerns at this time.  Discharge Vitals:   BP 127/66 (BP Location: Right Arm)   Pulse 77   Temp 98 F (36.7 C) (Oral)   Resp 16   Ht 5\' 5"  (1.651 m)   Wt 73.9 kg   SpO2 100%   BMI 27.12 kg/m   OBJECTIVE:  Physical Exam Constitutional:      Appearance: Normal appearance.  HENT:     Head: Normocephalic and atraumatic.  Cardiovascular:     Rate and Rhythm: Normal rate and regular rhythm.  Pulmonary:     Effort: Pulmonary effort is normal.     Breath sounds: Normal breath sounds.  Musculoskeletal:     Comments: Wound dressing of right heel ulceration present  Skin:    General: Skin is warm.  Neurological:     Mental Status: She is alert.  Psychiatric:        Mood and Affect: Mood normal.  Pertinent Labs, Studies, and Procedures:     Latest Ref Rng & Units 03/27/2023    5:54 AM 03/26/2023    3:29 PM 10/10/2022   11:02 PM  CBC  WBC 4.0 - 10.5 K/uL 5.3  5.4  6.5   Hemoglobin 12.0 - 15.0 g/dL 16.1  9.3  9.7   Hematocrit 36.0 - 46.0 % 34.3  30.0  31.4   Platelets 150 - 400 K/uL 317  268  240        Latest Ref Rng & Units 03/28/2023    6:01 AM 03/27/2023    5:54 AM 03/26/2023    3:29 PM  CMP  Glucose 70 - 99 mg/dL 096  045  409   BUN 8 - 23 mg/dL 37  48  56   Creatinine 0.44 - 1.00 mg/dL 8.11  9.14  7.82   Sodium 135 - 145 mmol/L 142  140  142   Potassium 3.5 - 5.1 mmol/L 3.6  3.6  3.6   Chloride 98 - 111 mmol/L 100  99  99   CO2 22 - 32 mmol/L  25  25  28    Calcium 8.9 - 10.3 mg/dL 9.4  9.2  8.7   Total Protein 6.5 - 8.1 g/dL   6.9   Total Bilirubin 0.0 - 1.2 mg/dL   0.6   Alkaline Phos 38 - 126 U/L   57   AST 15 - 41 U/L   12   ALT 0 - 44 U/L   8     VAS [place] LOWER EXTREMITY VENOUS (DVT) (7a-7p) Result Date: 03/27/2023  Lower Venous DVT Study Patient Name:  DAMIRA KEM  Date of Exam:   03/26/2023 Medical Rec #: [id number]        Accession #:    [id number] Date of Birth: 12-02-1950        Patient Gender: F  Patient Age:   63 years Exam Location:  [facility] Procedure:      VAS [place] LOWER EXTREMITY VENOUS (DVT) Referring Phys: [facility] SMOOT --------------------------------------------------------------------------------  Indications: Pain.  Comparison Study: Previous study on 8.26.2019. Performing Technologist: Fernande Bras  Examination Guidelines: A complete evaluation includes B-mode imaging, spectral Doppler, color Doppler, and power Doppler as needed of all accessible portions of each vessel. Bilateral testing is considered an integral part of a complete examination. Limited examinations for reoccurring indications may be performed as noted. The reflux portion of the exam is performed with the patient in reverse Trendelenburg.  +---------+---------------+---------+-----------+----------+--------------+ RIGHT    CompressibilityPhasicitySpontaneityPropertiesThrombus Aging +---------+---------------+---------+-----------+----------+--------------+ CFV      Full           Yes      Yes                                 +---------+---------------+---------+-----------+----------+--------------+ SFJ      Full           Yes      Yes                                 +---------+---------------+---------+-----------+----------+--------------+ FV Prox  Full                                                        +---------+---------------+---------+-----------+----------+--------------+ FV Mid   Full                                                        +---------+---------------+---------+-----------+----------+--------------+ FV DistalFull                                                        +---------+---------------+---------+-----------+----------+--------------+ PFV      Full                                                        +---------+---------------+---------+-----------+----------+--------------+  POP      Full           Yes      Yes                                  +---------+---------------+---------+-----------+----------+--------------+ PTV      Full                                                        +---------+---------------+---------+-----------+----------+--------------+ PERO     Full                                                        +---------+---------------+---------+-----------+----------+--------------+   +----+---------------+---------+-----------+----------+--------------+ LEFTCompressibilityPhasicitySpontaneityPropertiesThrombus Aging +----+---------------+---------+-----------+----------+--------------+ CFV Full           Yes      Yes                                 +----+---------------+---------+-----------+----------+--------------+ SFJ Full           Yes      Yes                                 +----+---------------+---------+-----------+----------+--------------+     Summary: RIGHT: - There is no evidence of deep vein thrombosis in the lower extremity.  - No cystic structure found in the popliteal fossa.  LEFT: - No evidence of common femoral vein obstruction.   *See table(s) above for measurements and observations. Electronically signed by Lemar Livings MD on 03/27/2023 at 10:49:56 AM.    Final    CT Head Wo Contrast Result Date: 03/26/2023 CLINICAL DATA:  Mental status change of unknown causes EXAM: CT HEAD WITHOUT CONTRAST TECHNIQUE: Contiguous axial images were obtained from the base of the skull through the vertex without intravenous contrast. RADIATION DOSE REDUCTION: This exam was performed according to the departmental dose-optimization program which includes automated exposure control, adjustment of the mA and/or kV according to patient size and/or use of iterative reconstruction technique. COMPARISON:  02/15/2023 FINDINGS: Brain: Brain atrophy as seen previously. Chronic small-vessel ischemic changes of the hemispheric white matter and basal ganglia. No sign of acute infarction, mass  lesion, hemorrhage, hydrocephalus or extra-axial collection. Vascular: There is atherosclerotic calcification of the major vessels at the base of the brain. Skull: Negative Sinuses/Orbits: Clear/normal Other: None IMPRESSION: No acute CT finding. Atrophy and chronic small-vessel ischemic changes. Electronically Signed   By: Paulina Fusi M.D.   On: 03/26/2023 17:06   DG Foot Complete Right Result Date: 03/26/2023 CLINICAL DATA:  Right heel wound. EXAM: RIGHT FOOT COMPLETE - 3+ VIEW COMPARISON:  None Available. FINDINGS: Mild-to-moderate hallux valgus. There is irregularity and lucency within the posterior plantar heel soft tissues consistent with the reported soft tissue ulcer/wound. There appears to be approximately 10 mm of soft tissue in between the ulcer and the closest posterior plantar aspect of the calcaneus. No definite calcaneal cortical erosion is seen. Moderate plantar calcaneal heel spur. Mild tarsometatarsal subchondral  sclerosis diffusely. No subcutaneous air. High-grade atherosclerotic calcifications. IMPRESSION: 1. Posterior plantar heel soft tissue ulcer/wound. No definite calcaneal cortical erosion is seen to indicate radiographic evidence of acute osteomyelitis. 2. Mild-to-moderate hallux valgus. Electronically Signed   By: Neita Garnet M.D.   On: 03/26/2023 15:07     Signed: Morrie Sheldon, MD Internal Medicine Resident, PGY-1 [facility]  Pager: [phone]

## 2023-03-28 NOTE — TOC Transition Note (Signed)
Transition of Care [facility]) - Discharge Note   Patient Details  Name: Chelsea [name] MRN: [id number] Date of Birth: 1950/09/26  Transition of Care [facility]) CM/SW Contact:  Brooke Steinhilber Felipa [place], Student-Social Work Phone Number: 03/28/2023, 2:19 PM   Clinical Narrative:   MSW Student recived insurance approval for patient to admit to SNF. MSW Student confirmed with MD that patient is stable for discharge. MSW Student notified Delice Bison and they are in agreement with discharge. MSW Student confirmed bed is available at SNF. Transport arranged with [facility] for next available.  Number to call report: [phone] ext [phone]    Final next level of care: Skilled Nursing Facility Barriers to Discharge: Barriers Resolved   Patient Goals and CMS Choice Patient states their goals for this hospitalization and ongoing recovery are:: patient unable to participate in goal setting, not fully oriented CMS Medicare.gov Compare Post Acute Care list provided to:: Patient Represenative (must comment) Choice offered to / list presented to : Adult Children [place] ownership interest in [facility].provided to:: Adult Children    Discharge Placement              Patient [facility] bed at: [facility] Patient to be transferred to facility by: [facility] Name of family member notified: Delice Bison Patient and family notified of of transfer: 03/28/23  Discharge Plan and Services Additional resources added to the After Visit Summary for       Post Acute Care Choice: Skilled Nursing Facility                               Social Drivers of Health (SDOH) Interventions SDOH Screenings   Food Insecurity: No Food Insecurity (03/27/2023)  Housing: Low Risk  (03/27/2023)  Transportation Needs: Unmet Transportation Needs (03/27/2023)  Utilities: Not At Risk (03/27/2023)  Depression (PHQ2-9): Low Risk  (11/16/2022)  Physical Activity: Inactive (02/17/2021)  Social Connections: Moderately Integrated  (03/27/2023)  Tobacco Use: Low Risk  (03/26/2023)     Readmission Risk Interventions     No data to display

## 2023-03-28 NOTE — TOC Progression Note (Signed)
Transition of Care [facility] - Progression Note    Patient Details  Name: Chelsea [name] MRN: [id number] Date of Birth: January 06, 1951  Transition of Care [facility]) CM/SW Contact  Baldemar Lenis, [place] Phone Number: 03/28/2023, 2:29 PM  Clinical Narrative:   CSW coordinated with PT assigned for an updated note for insurance approval. CSW contacted CMA to request insurance authorization. Awaiting approval.    Expected Discharge Plan: Skilled Nursing Facility Barriers to Discharge: Insurance Authorization, Continued Medical Work up  Expected Discharge Plan and Services     Post Acute Care Choice: Skilled Nursing Facility Living arrangements for the past 2 months: Single Family Home Expected Discharge Date: 03/28/23                                     Social Determinants of Health (SDOH) Interventions SDOH Screenings   Food Insecurity: No Food Insecurity (03/27/2023)  Housing: Low Risk  (03/27/2023)  Transportation Needs: Unmet Transportation Needs (03/27/2023)  Utilities: Not At Risk (03/27/2023)  Depression (PHQ2-9): Low Risk  (11/16/2022)  Physical Activity: Inactive (02/17/2021)  Social Connections: Moderately Integrated (03/27/2023)  Tobacco Use: Low Risk  (03/26/2023)    Readmission Risk Interventions     No data to display

## 2023-03-28 NOTE — Progress Notes (Signed)
Physical Therapy Treatment Patient Details Name: Chelsea [name] MRN: [id number] DOB: Nov 18, 1950 Today's Date: 03/28/2023   History of Present Illness Chelsea [name] is a 73 y.o female presenting with generalized decline for 2 months and L leg and R heel pain. PMHx includes HTN, DM, HLD, CVA, CKD, cataracts (bilateral), Insomnia, chronic pain syndrome (knee, back)    PT Comments  Patient hesitant to participate stating she had just had her bath and was tired. Agreed she wants to get stronger so that she can return home and agreed to try. Bed mobility +2 max-total assist throughout; sitting balance CGA-mod assist depending on level of fatigue/dizziness/nausea; stood x 1 with RW and +2 max assist with strong posterior lean. On second attempt, feet began sliding forward and unable to achieve full upright. Offered to try stedy lift for transfer to chair and pt hesitant due to fatigue and then equipment not available with pt returned to supine. As pt requiring heavy assist, Patient will benefit from continued inpatient follow up therapy, <3 hours/day     If plan is discharge home, recommend the following: Two people to help with walking and/or transfers;Two people to help with bathing/dressing/bathroom;Assistance with cooking/housework;Assistance with feeding;Direct supervision/assist for medications management;Direct supervision/assist for financial management;Assist for transportation;Help with stairs or ramp for entrance;Supervision due to cognitive status   Can travel by private vehicle     No  Equipment Recommendations  Hospital bed;Hoyer lift;Other (comment) (ramp construction for safe entrance/exit)    Recommendations for Other Services       Precautions / Restrictions Precautions Precautions: Fall Restrictions Weight Bearing Restrictions Per Provider Order: No     Mobility  Bed Mobility Overal bed mobility: Needs Assistance Bed Mobility: Rolling, Sidelying to Sit, Sit to  Supine Rolling: Max assist, +2 for physical assistance, +2 for safety/equipment Sidelying to sit: +2 for physical assistance, +2 for safety/equipment, Max assist, HOB elevated   Sit to supine: Total assist, +2 for physical assistance   General bed mobility comments: once on her side, she assisted with moving legs off EOB and pushing up with UEs to raise torso    Transfers Overall transfer level: Needs assistance Equipment used: Rolling walker (2 wheels) Transfers: Sit to/from Stand Sit to Stand: +2 physical assistance, Max assist, From elevated surface           General transfer comment: pt with posterior lean with feet sliding forward and unable to achieve full upright x 2 trials    Ambulation/Gait               General Gait Details: unable   Stairs             Wheelchair Mobility     Tilt Bed    Modified Rankin (Stroke Patients Only)       Balance Overall balance assessment: Needs assistance Sitting-balance support: Feet supported Sitting balance-Leahy Scale: Poor Sitting balance - Comments: min-max A Postural control: Posterior lean Standing balance support: Bilateral upper extremity supported Standing balance-Leahy Scale: Zero                              Communication Communication Communication: No apparent difficulties (difficult to understand at times)  Cognition Arousal: Alert Behavior During Therapy: WFL for tasks assessed/performed   PT - Cognitive impairments: No family/caregiver present to determine baseline  PT - Cognition Comments: difficulty following some simple commands Following commands: Impaired Following commands impaired: Follows one step commands with increased time    Cueing Cueing Techniques: Verbal cues, Gestural cues, Visual cues, Tactile cues  Exercises      General Comments General comments (skin integrity, edema, etc.): reported dizziness upon initial supine to sit;  pt reported nausea after standing      Pertinent Vitals/Pain Pain Assessment Pain Assessment: No/denies pain    Home Living                          Prior Function            PT Goals (current goals can now be found in the care plan section) Acute Rehab PT Goals Patient Stated Goal: wants to go home Time For Goal Achievement: 04/10/23 Potential to Achieve Goals: Fair Progress towards PT goals: Progressing toward goals    Frequency    Min 1X/week      PT Plan      Co-evaluation              AM-PAC PT "6 Clicks" Mobility   Outcome Measure  Help needed turning from your back to your side while in a flat bed without using bedrails?: Total Help needed moving from lying on your back to sitting on the side of a flat bed without using bedrails?: Total Help needed moving to and from a bed to a chair (including a wheelchair)?: Total Help needed standing up from a chair using your arms (e.g., wheelchair or bedside chair)?: Total Help needed to walk in hospital room?: Total Help needed climbing 3-5 steps with a railing? : Total 6 Click Score: 6    End of Session Equipment Utilized During Treatment: Gait belt Activity Tolerance: Patient limited by fatigue;Patient tolerated treatment well;Other (comment) (pt self-limiting) Patient left: in bed;with call bell/phone within reach;with bed alarm set Nurse Communication: Mobility status;Need for lift equipment (?could use stedy) PT Visit Diagnosis: Unsteadiness on feet (R26.81);Other abnormalities of gait and mobility (R26.89);Muscle weakness (generalized) (M62.81);Difficulty in walking, not elsewhere classified (R26.2);Other symptoms and signs involving the nervous system (R29.898);Pain Pain - Right/Left: Right Pain - part of body:  (heel with pressure,)     Time: 0981-1914 PT Time Calculation (min) (ACUTE ONLY): 19 min  Charges:    $Therapeutic Activity: 8-22 mins PT General Charges $$ ACUTE PT VISIT: 1  Visit                      Jerolyn Center, PT Acute Rehabilitation Services  Office [phone]    Zena Amos 03/28/2023, 12:26 PM

## 2023-03-28 NOTE — Telephone Encounter (Signed)
RTC to [facility] states got a call from patient's daughter about her Carvedilol dosing.  Daughter stated it should be 1 time a day. Pharmacy is doing the dose packing for patient for delivery.  Informed Pharmacy that patient is currently in the hospital and that there may be medication and dosing changes made. Will hold packaging until prescription changes are sent to Pharmacy.

## 2023-03-29 DIAGNOSIS — I639 Cerebral infarction, unspecified: Secondary | ICD-10-CM | POA: Diagnosis not present

## 2023-03-29 DIAGNOSIS — E785 Hyperlipidemia, unspecified: Secondary | ICD-10-CM | POA: Diagnosis not present

## 2023-03-29 DIAGNOSIS — E119 Type 2 diabetes mellitus without complications: Secondary | ICD-10-CM | POA: Diagnosis not present

## 2023-04-01 DIAGNOSIS — E785 Hyperlipidemia, unspecified: Secondary | ICD-10-CM | POA: Diagnosis not present

## 2023-04-01 DIAGNOSIS — I639 Cerebral infarction, unspecified: Secondary | ICD-10-CM | POA: Diagnosis not present

## 2023-04-01 DIAGNOSIS — E119 Type 2 diabetes mellitus without complications: Secondary | ICD-10-CM | POA: Diagnosis not present

## 2023-04-02 DIAGNOSIS — D638 Anemia in other chronic diseases classified elsewhere: Secondary | ICD-10-CM | POA: Diagnosis not present

## 2023-04-02 DIAGNOSIS — L89616 Pressure-induced deep tissue damage of right heel: Secondary | ICD-10-CM | POA: Diagnosis not present

## 2023-04-02 DIAGNOSIS — E1159 Type 2 diabetes mellitus with other circulatory complications: Secondary | ICD-10-CM | POA: Diagnosis not present

## 2023-04-02 DIAGNOSIS — E785 Hyperlipidemia, unspecified: Secondary | ICD-10-CM | POA: Diagnosis not present

## 2023-04-03 DIAGNOSIS — E119 Type 2 diabetes mellitus without complications: Secondary | ICD-10-CM | POA: Diagnosis not present

## 2023-04-03 DIAGNOSIS — E785 Hyperlipidemia, unspecified: Secondary | ICD-10-CM | POA: Diagnosis not present

## 2023-04-03 DIAGNOSIS — I639 Cerebral infarction, unspecified: Secondary | ICD-10-CM | POA: Diagnosis not present

## 2023-04-03 NOTE — Telephone Encounter (Signed)
 Call to patient's daughter.  Patient is now in Rehab for 2 weeks.

## 2023-04-05 DIAGNOSIS — E785 Hyperlipidemia, unspecified: Secondary | ICD-10-CM | POA: Diagnosis not present

## 2023-04-05 DIAGNOSIS — E119 Type 2 diabetes mellitus without complications: Secondary | ICD-10-CM | POA: Diagnosis not present

## 2023-04-05 DIAGNOSIS — I639 Cerebral infarction, unspecified: Secondary | ICD-10-CM | POA: Diagnosis not present

## 2023-04-08 DIAGNOSIS — E119 Type 2 diabetes mellitus without complications: Secondary | ICD-10-CM | POA: Diagnosis not present

## 2023-04-08 DIAGNOSIS — E785 Hyperlipidemia, unspecified: Secondary | ICD-10-CM | POA: Diagnosis not present

## 2023-04-08 DIAGNOSIS — I639 Cerebral infarction, unspecified: Secondary | ICD-10-CM | POA: Diagnosis not present

## 2023-04-09 DIAGNOSIS — E785 Hyperlipidemia, unspecified: Secondary | ICD-10-CM | POA: Diagnosis not present

## 2023-04-09 DIAGNOSIS — D638 Anemia in other chronic diseases classified elsewhere: Secondary | ICD-10-CM | POA: Diagnosis not present

## 2023-04-09 DIAGNOSIS — E1159 Type 2 diabetes mellitus with other circulatory complications: Secondary | ICD-10-CM | POA: Diagnosis not present

## 2023-04-09 DIAGNOSIS — L89616 Pressure-induced deep tissue damage of right heel: Secondary | ICD-10-CM | POA: Diagnosis not present

## 2023-04-10 DIAGNOSIS — I639 Cerebral infarction, unspecified: Secondary | ICD-10-CM | POA: Diagnosis not present

## 2023-04-10 DIAGNOSIS — E785 Hyperlipidemia, unspecified: Secondary | ICD-10-CM | POA: Diagnosis not present

## 2023-04-10 DIAGNOSIS — E119 Type 2 diabetes mellitus without complications: Secondary | ICD-10-CM | POA: Diagnosis not present

## 2023-04-11 DIAGNOSIS — E785 Hyperlipidemia, unspecified: Secondary | ICD-10-CM | POA: Diagnosis not present

## 2023-04-11 DIAGNOSIS — E119 Type 2 diabetes mellitus without complications: Secondary | ICD-10-CM | POA: Diagnosis not present

## 2023-04-11 DIAGNOSIS — I639 Cerebral infarction, unspecified: Secondary | ICD-10-CM | POA: Diagnosis not present

## 2023-04-12 DIAGNOSIS — E785 Hyperlipidemia, unspecified: Secondary | ICD-10-CM | POA: Diagnosis not present

## 2023-04-12 DIAGNOSIS — I639 Cerebral infarction, unspecified: Secondary | ICD-10-CM | POA: Diagnosis not present

## 2023-04-12 DIAGNOSIS — E119 Type 2 diabetes mellitus without complications: Secondary | ICD-10-CM | POA: Diagnosis not present

## 2023-04-15 DIAGNOSIS — I639 Cerebral infarction, unspecified: Secondary | ICD-10-CM | POA: Diagnosis not present

## 2023-04-15 DIAGNOSIS — E785 Hyperlipidemia, unspecified: Secondary | ICD-10-CM | POA: Diagnosis not present

## 2023-04-15 DIAGNOSIS — E119 Type 2 diabetes mellitus without complications: Secondary | ICD-10-CM | POA: Diagnosis not present

## 2023-04-16 DIAGNOSIS — L89616 Pressure-induced deep tissue damage of right heel: Secondary | ICD-10-CM | POA: Diagnosis not present

## 2023-04-16 DIAGNOSIS — E1159 Type 2 diabetes mellitus with other circulatory complications: Secondary | ICD-10-CM | POA: Diagnosis not present

## 2023-04-16 DIAGNOSIS — D638 Anemia in other chronic diseases classified elsewhere: Secondary | ICD-10-CM | POA: Diagnosis not present

## 2023-04-16 DIAGNOSIS — E785 Hyperlipidemia, unspecified: Secondary | ICD-10-CM | POA: Diagnosis not present

## 2023-04-17 ENCOUNTER — Telehealth: Payer: Self-pay

## 2023-04-17 DIAGNOSIS — I639 Cerebral infarction, unspecified: Secondary | ICD-10-CM | POA: Diagnosis not present

## 2023-04-17 DIAGNOSIS — E785 Hyperlipidemia, unspecified: Secondary | ICD-10-CM | POA: Diagnosis not present

## 2023-04-17 DIAGNOSIS — E119 Type 2 diabetes mellitus without complications: Secondary | ICD-10-CM | POA: Diagnosis not present

## 2023-04-17 NOTE — Telephone Encounter (Signed)
 Pt  daughter is going to call back to reschedule her mom  HFU.[name] Kitchen we work on teams Dr dean is blue team ...  Her daughter request  Monday or Thursday  ... First open is  3/13 with  Dr Nooruddin   and Dr  Ninfa Meeker   with blue team please schedule  pt thanks ... Pt  is aware  of the teams  and not seeing pcp each visit

## 2023-04-18 ENCOUNTER — Encounter: Payer: 59 | Admitting: Student

## 2023-04-18 DIAGNOSIS — E119 Type 2 diabetes mellitus without complications: Secondary | ICD-10-CM | POA: Diagnosis not present

## 2023-04-18 DIAGNOSIS — I639 Cerebral infarction, unspecified: Secondary | ICD-10-CM | POA: Diagnosis not present

## 2023-04-18 DIAGNOSIS — E785 Hyperlipidemia, unspecified: Secondary | ICD-10-CM | POA: Diagnosis not present

## 2023-04-19 ENCOUNTER — Inpatient Hospital Stay (HOSPITAL_COMMUNITY)

## 2023-04-19 ENCOUNTER — Other Ambulatory Visit: Payer: Self-pay

## 2023-04-19 ENCOUNTER — Emergency Department (HOSPITAL_COMMUNITY)

## 2023-04-19 ENCOUNTER — Encounter (HOSPITAL_COMMUNITY): Payer: Self-pay

## 2023-04-19 ENCOUNTER — Inpatient Hospital Stay (HOSPITAL_COMMUNITY)
Admission: EM | Admit: 2023-04-19 | Discharge: 2023-05-14 | DRG: 061 | Disposition: E | Attending: Neurology | Admitting: Neurology

## 2023-04-19 DIAGNOSIS — D62 Acute posthemorrhagic anemia: Secondary | ICD-10-CM | POA: Diagnosis not present

## 2023-04-19 DIAGNOSIS — R1310 Dysphagia, unspecified: Secondary | ICD-10-CM | POA: Diagnosis present

## 2023-04-19 DIAGNOSIS — F1010 Alcohol abuse, uncomplicated: Secondary | ICD-10-CM | POA: Diagnosis present

## 2023-04-19 DIAGNOSIS — I4891 Unspecified atrial fibrillation: Secondary | ICD-10-CM | POA: Diagnosis not present

## 2023-04-19 DIAGNOSIS — J45909 Unspecified asthma, uncomplicated: Secondary | ICD-10-CM | POA: Diagnosis present

## 2023-04-19 DIAGNOSIS — R531 Weakness: Secondary | ICD-10-CM

## 2023-04-19 DIAGNOSIS — R29717 NIHSS score 17: Secondary | ICD-10-CM | POA: Diagnosis not present

## 2023-04-19 DIAGNOSIS — Z978 Presence of other specified devices: Secondary | ICD-10-CM | POA: Diagnosis not present

## 2023-04-19 DIAGNOSIS — R569 Unspecified convulsions: Secondary | ICD-10-CM | POA: Diagnosis not present

## 2023-04-19 DIAGNOSIS — E1165 Type 2 diabetes mellitus with hyperglycemia: Secondary | ICD-10-CM | POA: Diagnosis present

## 2023-04-19 DIAGNOSIS — F03918 Unspecified dementia, unspecified severity, with other behavioral disturbance: Secondary | ICD-10-CM | POA: Diagnosis present

## 2023-04-19 DIAGNOSIS — I613 Nontraumatic intracerebral hemorrhage in brain stem: Secondary | ICD-10-CM | POA: Diagnosis present

## 2023-04-19 DIAGNOSIS — J9600 Acute respiratory failure, unspecified whether with hypoxia or hypercapnia: Secondary | ICD-10-CM | POA: Diagnosis not present

## 2023-04-19 DIAGNOSIS — Z66 Do not resuscitate: Secondary | ICD-10-CM | POA: Diagnosis not present

## 2023-04-19 DIAGNOSIS — Z794 Long term (current) use of insulin: Secondary | ICD-10-CM

## 2023-04-19 DIAGNOSIS — R739 Hyperglycemia, unspecified: Secondary | ICD-10-CM | POA: Diagnosis not present

## 2023-04-19 DIAGNOSIS — E669 Obesity, unspecified: Secondary | ICD-10-CM | POA: Diagnosis present

## 2023-04-19 DIAGNOSIS — I69354 Hemiplegia and hemiparesis following cerebral infarction affecting left non-dominant side: Secondary | ICD-10-CM | POA: Diagnosis not present

## 2023-04-19 DIAGNOSIS — T45615A Adverse effect of thrombolytic drugs, initial encounter: Secondary | ICD-10-CM | POA: Diagnosis present

## 2023-04-19 DIAGNOSIS — I10 Essential (primary) hypertension: Secondary | ICD-10-CM | POA: Diagnosis not present

## 2023-04-19 DIAGNOSIS — Z7189 Other specified counseling: Secondary | ICD-10-CM | POA: Diagnosis not present

## 2023-04-19 DIAGNOSIS — Z83719 Family history of colon polyps, unspecified: Secondary | ICD-10-CM

## 2023-04-19 DIAGNOSIS — Z883 Allergy status to other anti-infective agents status: Secondary | ICD-10-CM

## 2023-04-19 DIAGNOSIS — G9340 Encephalopathy, unspecified: Secondary | ICD-10-CM

## 2023-04-19 DIAGNOSIS — I517 Cardiomegaly: Secondary | ICD-10-CM | POA: Diagnosis not present

## 2023-04-19 DIAGNOSIS — Z79899 Other long term (current) drug therapy: Secondary | ICD-10-CM

## 2023-04-19 DIAGNOSIS — G9349 Other encephalopathy: Secondary | ICD-10-CM | POA: Diagnosis present

## 2023-04-19 DIAGNOSIS — R29709 NIHSS score 9: Secondary | ICD-10-CM | POA: Diagnosis not present

## 2023-04-19 DIAGNOSIS — R29810 Facial weakness: Secondary | ICD-10-CM | POA: Diagnosis present

## 2023-04-19 DIAGNOSIS — N3000 Acute cystitis without hematuria: Secondary | ICD-10-CM | POA: Diagnosis not present

## 2023-04-19 DIAGNOSIS — G8194 Hemiplegia, unspecified affecting left nondominant side: Secondary | ICD-10-CM | POA: Diagnosis not present

## 2023-04-19 DIAGNOSIS — I619 Nontraumatic intracerebral hemorrhage, unspecified: Secondary | ICD-10-CM

## 2023-04-19 DIAGNOSIS — Z6831 Body mass index (BMI) 31.0-31.9, adult: Secondary | ICD-10-CM

## 2023-04-19 DIAGNOSIS — I129 Hypertensive chronic kidney disease with stage 1 through stage 4 chronic kidney disease, or unspecified chronic kidney disease: Secondary | ICD-10-CM | POA: Diagnosis present

## 2023-04-19 DIAGNOSIS — I6523 Occlusion and stenosis of bilateral carotid arteries: Secondary | ICD-10-CM | POA: Diagnosis not present

## 2023-04-19 DIAGNOSIS — E785 Hyperlipidemia, unspecified: Secondary | ICD-10-CM | POA: Diagnosis not present

## 2023-04-19 DIAGNOSIS — G4700 Insomnia, unspecified: Secondary | ICD-10-CM | POA: Diagnosis present

## 2023-04-19 DIAGNOSIS — E1122 Type 2 diabetes mellitus with diabetic chronic kidney disease: Secondary | ICD-10-CM | POA: Diagnosis present

## 2023-04-19 DIAGNOSIS — Z888 Allergy status to other drugs, medicaments and biological substances status: Secondary | ICD-10-CM

## 2023-04-19 DIAGNOSIS — R29818 Other symptoms and signs involving the nervous system: Secondary | ICD-10-CM | POA: Diagnosis not present

## 2023-04-19 DIAGNOSIS — J398 Other specified diseases of upper respiratory tract: Secondary | ICD-10-CM | POA: Diagnosis present

## 2023-04-19 DIAGNOSIS — J9601 Acute respiratory failure with hypoxia: Secondary | ICD-10-CM

## 2023-04-19 DIAGNOSIS — Z515 Encounter for palliative care: Secondary | ICD-10-CM

## 2023-04-19 DIAGNOSIS — R404 Transient alteration of awareness: Secondary | ICD-10-CM | POA: Diagnosis not present

## 2023-04-19 DIAGNOSIS — Z4682 Encounter for fitting and adjustment of non-vascular catheter: Secondary | ICD-10-CM | POA: Diagnosis not present

## 2023-04-19 DIAGNOSIS — F0394 Unspecified dementia, unspecified severity, with anxiety: Secondary | ICD-10-CM | POA: Diagnosis present

## 2023-04-19 DIAGNOSIS — N1832 Chronic kidney disease, stage 3b: Secondary | ICD-10-CM | POA: Diagnosis present

## 2023-04-19 DIAGNOSIS — I639 Cerebral infarction, unspecified: Secondary | ICD-10-CM | POA: Diagnosis present

## 2023-04-19 DIAGNOSIS — E440 Moderate protein-calorie malnutrition: Secondary | ICD-10-CM | POA: Diagnosis present

## 2023-04-19 DIAGNOSIS — R29731 NIHSS score 31: Secondary | ICD-10-CM | POA: Diagnosis not present

## 2023-04-19 DIAGNOSIS — E119 Type 2 diabetes mellitus without complications: Secondary | ICD-10-CM | POA: Diagnosis not present

## 2023-04-19 DIAGNOSIS — I610 Nontraumatic intracerebral hemorrhage in hemisphere, subcortical: Secondary | ICD-10-CM | POA: Diagnosis not present

## 2023-04-19 DIAGNOSIS — Z743 Need for continuous supervision: Secondary | ICD-10-CM | POA: Diagnosis not present

## 2023-04-19 DIAGNOSIS — G894 Chronic pain syndrome: Secondary | ICD-10-CM | POA: Diagnosis present

## 2023-04-19 DIAGNOSIS — R6889 Other general symptoms and signs: Secondary | ICD-10-CM | POA: Diagnosis not present

## 2023-04-19 DIAGNOSIS — R9082 White matter disease, unspecified: Secondary | ICD-10-CM | POA: Diagnosis not present

## 2023-04-19 DIAGNOSIS — Z886 Allergy status to analgesic agent status: Secondary | ICD-10-CM

## 2023-04-19 DIAGNOSIS — R2990 Unspecified symptoms and signs involving the nervous system: Secondary | ICD-10-CM | POA: Diagnosis present

## 2023-04-19 DIAGNOSIS — Z7982 Long term (current) use of aspirin: Secondary | ICD-10-CM

## 2023-04-19 DIAGNOSIS — Z452 Encounter for adjustment and management of vascular access device: Secondary | ICD-10-CM | POA: Diagnosis not present

## 2023-04-19 DIAGNOSIS — R4182 Altered mental status, unspecified: Principal | ICD-10-CM

## 2023-04-19 DIAGNOSIS — R29722 NIHSS score 22: Secondary | ICD-10-CM | POA: Diagnosis not present

## 2023-04-19 DIAGNOSIS — N3090 Cystitis, unspecified without hematuria: Secondary | ICD-10-CM | POA: Diagnosis not present

## 2023-04-19 DIAGNOSIS — G319 Degenerative disease of nervous system, unspecified: Secondary | ICD-10-CM | POA: Diagnosis not present

## 2023-04-19 DIAGNOSIS — F410 Panic disorder [episodic paroxysmal anxiety] without agoraphobia: Secondary | ICD-10-CM | POA: Diagnosis present

## 2023-04-19 DIAGNOSIS — R001 Bradycardia, unspecified: Secondary | ICD-10-CM | POA: Diagnosis not present

## 2023-04-19 DIAGNOSIS — Z880 Allergy status to penicillin: Secondary | ICD-10-CM

## 2023-04-19 DIAGNOSIS — R29720 NIHSS score 20: Secondary | ICD-10-CM | POA: Diagnosis present

## 2023-04-19 DIAGNOSIS — I6389 Other cerebral infarction: Secondary | ICD-10-CM | POA: Diagnosis not present

## 2023-04-19 DIAGNOSIS — Z833 Family history of diabetes mellitus: Secondary | ICD-10-CM

## 2023-04-19 LAB — COMPREHENSIVE METABOLIC PANEL WITH GFR
ALT: 19 U/L (ref 0–44)
AST: 16 U/L (ref 15–41)
Albumin: 2.6 g/dL — ABNORMAL LOW (ref 3.5–5.0)
Alkaline Phosphatase: 78 U/L (ref 38–126)
Anion gap: 11 (ref 5–15)
BUN: 34 mg/dL — ABNORMAL HIGH (ref 8–23)
CO2: 24 mmol/L (ref 22–32)
Calcium: 8.2 mg/dL — ABNORMAL LOW (ref 8.9–10.3)
Chloride: 103 mmol/L (ref 98–111)
Creatinine, Ser: 1.55 mg/dL — ABNORMAL HIGH (ref 0.44–1.00)
GFR, Estimated: 35 mL/min — ABNORMAL LOW
Glucose, Bld: 281 mg/dL — ABNORMAL HIGH (ref 70–99)
Potassium: 4.8 mmol/L (ref 3.5–5.1)
Sodium: 138 mmol/L (ref 135–145)
Total Bilirubin: 0.7 mg/dL (ref 0.0–1.2)
Total Protein: 6.2 g/dL — ABNORMAL LOW (ref 6.5–8.1)

## 2023-04-19 LAB — I-STAT ARTERIAL BLOOD GAS, ED
Acid-Base Excess: 1 mmol/L (ref 0.0–2.0)
Bicarbonate: 26.9 mmol/L (ref 20.0–28.0)
Calcium, Ion: 1.21 mmol/L (ref 1.15–1.40)
HCT: 25 % — ABNORMAL LOW (ref 36.0–46.0)
Hemoglobin: 8.5 g/dL — ABNORMAL LOW (ref 12.0–15.0)
O2 Saturation: 99 %
Patient temperature: 98.2
Potassium: 4.8 mmol/L (ref 3.5–5.1)
Sodium: 139 mmol/L (ref 135–145)
TCO2: 28 mmol/L (ref 22–32)
pCO2 arterial: 50.4 mmHg — ABNORMAL HIGH (ref 32–48)
pH, Arterial: 7.334 — ABNORMAL LOW (ref 7.35–7.45)
pO2, Arterial: 153 mmHg — ABNORMAL HIGH (ref 83–108)

## 2023-04-19 LAB — CBC WITH DIFFERENTIAL/PLATELET
Abs Immature Granulocytes: 0.06 10*3/uL (ref 0.00–0.07)
Basophils Absolute: 0.1 10*3/uL (ref 0.0–0.1)
Basophils Relative: 1 %
Eosinophils Absolute: 0.2 10*3/uL (ref 0.0–0.5)
Eosinophils Relative: 2 %
HCT: 27.1 % — ABNORMAL LOW (ref 36.0–46.0)
Hemoglobin: 8.5 g/dL — ABNORMAL LOW (ref 12.0–15.0)
Immature Granulocytes: 1 %
Lymphocytes Relative: 23 %
Lymphs Abs: 2.5 10*3/uL (ref 0.7–4.0)
MCH: 27.6 pg (ref 26.0–34.0)
MCHC: 31.4 g/dL (ref 30.0–36.0)
MCV: 88 fL (ref 80.0–100.0)
Monocytes Absolute: 0.6 10*3/uL (ref 0.1–1.0)
Monocytes Relative: 6 %
Neutro Abs: 7.1 10*3/uL (ref 1.7–7.7)
Neutrophils Relative %: 67 %
Platelets: 271 10*3/uL (ref 150–400)
RBC: 3.08 MIL/uL — ABNORMAL LOW (ref 3.87–5.11)
RDW: 18.1 % — ABNORMAL HIGH (ref 11.5–15.5)
WBC: 10.5 10*3/uL (ref 4.0–10.5)
nRBC: 0 % (ref 0.0–0.2)

## 2023-04-19 LAB — PROTIME-INR
INR: 1.1 (ref 0.8–1.2)
Prothrombin Time: 14.5 s (ref 11.4–15.2)

## 2023-04-19 LAB — APTT: aPTT: 25 s (ref 24–36)

## 2023-04-19 LAB — CBG MONITORING, ED
Glucose-Capillary: 236 mg/dL — ABNORMAL HIGH (ref 70–99)
Glucose-Capillary: 240 mg/dL — ABNORMAL HIGH (ref 70–99)
Glucose-Capillary: 263 mg/dL — ABNORMAL HIGH (ref 70–99)

## 2023-04-19 LAB — TROPONIN I (HIGH SENSITIVITY): Troponin I (High Sensitivity): 6 ng/L

## 2023-04-19 LAB — GLUCOSE, CAPILLARY
Glucose-Capillary: 236 mg/dL — ABNORMAL HIGH (ref 70–99)
Glucose-Capillary: 293 mg/dL — ABNORMAL HIGH (ref 70–99)

## 2023-04-19 LAB — FIBRINOGEN: Fibrinogen: 536 mg/dL — ABNORMAL HIGH (ref 210–475)

## 2023-04-19 LAB — I-STAT CG4 LACTIC ACID, ED: Lactic Acid, Venous: 1.2 mmol/L (ref 0.5–1.9)

## 2023-04-19 MED ORDER — FENTANYL 2500MCG IN NS 250ML (10MCG/ML) PREMIX INFUSION
25.0000 ug/h | INTRAVENOUS | Status: DC
  Administered 2023-04-20 (×2): 25 ug/h via INTRAVENOUS
  Filled 2023-04-19: qty 250

## 2023-04-19 MED ORDER — LACTATED RINGERS IV BOLUS (SEPSIS)
1000.0000 mL | Freq: Once | INTRAVENOUS | Status: AC
  Administered 2023-04-19: 1000 mL via INTRAVENOUS

## 2023-04-19 MED ORDER — FENTANYL CITRATE PF 50 MCG/ML IJ SOSY
PREFILLED_SYRINGE | INTRAMUSCULAR | Status: AC | PRN
  Administered 2023-04-19: 50 ug via INTRAVENOUS

## 2023-04-19 MED ORDER — FENTANYL CITRATE PF 50 MCG/ML IJ SOSY: 25.0000 ug | PREFILLED_SYRINGE | Freq: Once | INTRAMUSCULAR | Status: DC

## 2023-04-19 MED ORDER — ALBUTEROL SULFATE (2.5 MG/3ML) 0.083% IN NEBU
2.5000 mg | INHALATION_SOLUTION | Freq: Four times a day (QID) | RESPIRATORY_TRACT | Status: DC
  Administered 2023-04-19 – 2023-04-24 (×19): 2.5 mg via RESPIRATORY_TRACT
  Filled 2023-04-19 (×20): qty 3

## 2023-04-19 MED ORDER — STROKE: EARLY STAGES OF RECOVERY BOOK
Freq: Once | Status: AC
  Filled 2023-04-19: qty 1

## 2023-04-19 MED ORDER — ACETAMINOPHEN 160 MG/5ML PO SOLN
650.0000 mg | ORAL | Status: DC | PRN
  Administered 2023-04-22 – 2023-04-23 (×2): 650 mg
  Filled 2023-04-19 (×2): qty 20.3

## 2023-04-19 MED ORDER — TRANEXAMIC ACID-NACL 1000-0.7 MG/100ML-% IV SOLN
1000.0000 mg | Freq: Once | INTRAVENOUS | Status: AC
  Administered 2023-04-19: 1000 mg via INTRAVENOUS

## 2023-04-19 MED ORDER — SODIUM CHLORIDE 0.9% IV SOLUTION: Freq: Once | INTRAVENOUS | Status: DC

## 2023-04-19 MED ORDER — ACETAMINOPHEN 650 MG RE SUPP: 650.0000 mg | RECTAL | Status: DC | PRN

## 2023-04-19 MED ORDER — FENTANYL 2500MCG IN NS 250ML (10MCG/ML) PREMIX INFUSION
INTRAVENOUS | Status: AC
  Filled 2023-04-19: qty 250

## 2023-04-19 MED ORDER — NOREPINEPHRINE 4 MG/250ML-% IV SOLN
0.0000 ug/min | INTRAVENOUS | Status: DC
  Administered 2023-04-19: 5 ug/min via INTRAVENOUS

## 2023-04-19 MED ORDER — SENNOSIDES-DOCUSATE SODIUM 8.6-50 MG PO TABS: 1.0000 | ORAL_TABLET | Freq: Every evening | ORAL | Status: DC | PRN

## 2023-04-19 MED ORDER — SODIUM CHLORIDE 0.9 % IV SOLN
250.0000 mL | INTRAVENOUS | Status: DC
  Administered 2023-04-19: 250 mL via INTRAVENOUS

## 2023-04-19 MED ORDER — FENTANYL BOLUS VIA INFUSION
25.0000 ug | INTRAVENOUS | Status: DC | PRN
  Administered 2023-04-20 – 2023-04-21 (×5): 50 ug via INTRAVENOUS

## 2023-04-19 MED ORDER — TRANEXAMIC ACID FOR EPISTAXIS: 500.0000 mg | Freq: Once | TOPICAL | Status: DC

## 2023-04-19 MED ORDER — MIDAZOLAM HCL 2 MG/2ML IJ SOLN
1.0000 mg | INTRAMUSCULAR | Status: DC | PRN
  Administered 2023-04-23: 2 mg via INTRAVENOUS
  Filled 2023-04-19: qty 2

## 2023-04-19 MED ORDER — ORAL CARE MOUTH RINSE
15.0000 mL | OROMUCOSAL | Status: DC
  Administered 2023-04-19 – 2023-04-24 (×57): 15 mL via OROMUCOSAL

## 2023-04-19 MED ORDER — ACETAMINOPHEN 325 MG PO TABS: 650.0000 mg | ORAL_TABLET | ORAL | Status: DC | PRN

## 2023-04-19 MED ORDER — CLEVIDIPINE BUTYRATE 0.5 MG/ML IV EMUL
0.0000 mg/h | INTRAVENOUS | Status: DC
Start: 2023-04-19 — End: 2023-04-20
  Administered 2023-04-19: 2 mg/h via INTRAVENOUS
  Filled 2023-04-19: qty 100

## 2023-04-19 MED ORDER — DOCUSATE SODIUM 50 MG/5ML PO LIQD
100.0000 mg | Freq: Two times a day (BID) | ORAL | Status: DC
  Administered 2023-04-19 – 2023-04-21 (×4): 100 mg
  Filled 2023-04-19 (×6): qty 10

## 2023-04-19 MED ORDER — INSULIN ASPART 100 UNIT/ML IJ SOLN
0.0000 [IU] | INTRAMUSCULAR | Status: DC
  Administered 2023-04-19: 8 [IU] via SUBCUTANEOUS
  Administered 2023-04-20: 5 [IU] via SUBCUTANEOUS
  Administered 2023-04-20: 2 [IU] via SUBCUTANEOUS
  Administered 2023-04-20 (×2): 5 [IU] via SUBCUTANEOUS
  Administered 2023-04-20: 3 [IU] via SUBCUTANEOUS
  Administered 2023-04-21: 15 [IU] via SUBCUTANEOUS
  Administered 2023-04-21: 5 [IU] via SUBCUTANEOUS
  Administered 2023-04-21: 11 [IU] via SUBCUTANEOUS
  Administered 2023-04-21: 8 [IU] via SUBCUTANEOUS
  Administered 2023-04-21: 11 [IU] via SUBCUTANEOUS
  Administered 2023-04-21 (×2): 8 [IU] via SUBCUTANEOUS
  Administered 2023-04-22: 3 [IU] via SUBCUTANEOUS
  Administered 2023-04-22: 11 [IU] via SUBCUTANEOUS
  Administered 2023-04-22: 5 [IU] via SUBCUTANEOUS
  Administered 2023-04-22: 11 [IU] via SUBCUTANEOUS
  Administered 2023-04-22 – 2023-04-23 (×2): 8 [IU] via SUBCUTANEOUS
  Administered 2023-04-23: 5 [IU] via SUBCUTANEOUS

## 2023-04-19 MED ORDER — SODIUM CHLORIDE 0.9 % IV SOLN: INTRAVENOUS | Status: AC

## 2023-04-19 MED ORDER — NOREPINEPHRINE 4 MG/250ML-% IV SOLN
0.0000 ug/min | INTRAVENOUS | Status: DC
  Administered 2023-04-19: 2 ug/min via INTRAVENOUS
  Filled 2023-04-19: qty 250

## 2023-04-19 MED ORDER — TRANEXAMIC ACID-NACL 1000-0.7 MG/100ML-% IV SOLN
1000.0000 mg | Freq: Once | INTRAVENOUS | Status: AC
  Administered 2023-04-19: 1000 mg via INTRAVENOUS
  Filled 2023-04-19: qty 100

## 2023-04-19 MED ORDER — ETOMIDATE 2 MG/ML IV SOLN
INTRAVENOUS | Status: AC | PRN
Start: 2023-04-19 — End: 2023-04-19
  Administered 2023-04-19: 10 mg via INTRAVENOUS

## 2023-04-19 MED ORDER — POLYETHYLENE GLYCOL 3350 17 G PO PACK
17.0000 g | PACK | Freq: Every day | ORAL | Status: DC
  Administered 2023-04-21: 17 g
  Filled 2023-04-19: qty 1

## 2023-04-19 MED ORDER — TENECTEPLASE FOR STROKE
0.2500 mg/kg | PACK | Freq: Once | INTRAVENOUS | Status: AC
  Administered 2023-04-19: 18 mg via INTRAVENOUS
  Filled 2023-04-19: qty 10

## 2023-04-19 MED ORDER — ORAL CARE MOUTH RINSE: 15.0000 mL | OROMUCOSAL | Status: DC | PRN

## 2023-04-19 MED ORDER — ATROPINE SULFATE 1 MG/10ML IJ SOSY
PREFILLED_SYRINGE | INTRAMUSCULAR | Status: AC
Start: 2023-04-19 — End: 2023-04-20
  Filled 2023-04-19: qty 10

## 2023-04-19 MED ORDER — MIDAZOLAM HCL (PF) 10 MG/2ML IJ SOLN
INTRAMUSCULAR | Status: AC
  Filled 2023-04-19: qty 2

## 2023-04-19 MED ORDER — PANTOPRAZOLE SODIUM 40 MG IV SOLR
40.0000 mg | Freq: Every day | INTRAVENOUS | Status: DC
  Administered 2023-04-19 – 2023-04-23 (×5): 40 mg via INTRAVENOUS
  Filled 2023-04-19 (×5): qty 10

## 2023-04-19 MED ORDER — FENTANYL CITRATE PF 50 MCG/ML IJ SOSY
PREFILLED_SYRINGE | INTRAMUSCULAR | Status: AC
  Filled 2023-04-19: qty 1

## 2023-04-19 NOTE — ED Notes (Addendum)
 Pt found by Primary RN to be bradycardic in the 30s, agonal breathing, and unresponsive to stimulus. Pt having a neuro status change. Pt pulse became weak and thready with continued agonal respirations, Code alarm activated. EDP at bedside, provided update to EDP, EDP said "This is what she was like before." Neurology called and SRN called, stat CT ordered and patient transferred to CT with primary RN. Pt having multiple episodes of heart rate going from 60s in afib to 30s. Pt not maintaining airway, needing multiple rounds of suctioning.

## 2023-04-19 NOTE — Code Documentation (Addendum)
 Chelsea [name] is a 73 yr old female with an unknown PMH arriving to [facility] via EMS on 04/19/2023. Pt is coming from home where she was last known well at 1030 today, and then went unresponsive. Code stroke activated in ED for rt gaze and left weakness.     Pt met by stroke team in scanner. Airway has been cleared by EDP. Labs, CBG obtained. Pt is on continuous monitor. NIHSS 20. Pt with rt gaze, mild left weakness, limited speech, left neglect. (Please see documentation for NIHSS details and timeline). The following imaging was obtained: CT. CT is negative for hemorrhage per Dr Roda Shutters. After further discussion with family, pt was given TNK at 1310 with family's consent. Continued efforts to obtain a line for CTA unsuccessful. Pt taken to MRI for STAT MRI, MRA.     Having received TNK, pt will need q 15 min VS and NIHSS for 2 hours, then q 30 min for 6 hours then hourly. She will need to be NPO until cleared with a Stroke swallow screen. Per Dr. Roda Shutters, MRI/MRA neg for LVO, therefore pt is not a candidate for thrombectomy.  Bedside handoff wit Jon Gills RN complete.

## 2023-04-19 NOTE — Procedures (Signed)
 Intubation Procedure Note  Chelsea [name]  [id number]  1950/09/17  Date:04/19/23  Time:6:16 PM   Provider Performing:Sindy Mccune Mechele Collin, MD   Procedure: Intubation (31500)  Indication(s) Respiratory Failure  Consent Unable to obtain consent due to emergent nature of procedure.   Anesthesia Etomidate, Versed, and Fentanyl   Time Out Verified patient identification, verified procedure, site/side was marked, verified correct patient position, special equipment/implants available, medications/allergies/relevant history reviewed, required imaging and test results available.   Sterile Technique Usual hand hygeine, masks, and gloves were used   Procedure Description Patient positioned in bed supine.  Sedation given as noted above.  Patient was intubated with endotracheal tube using Glidescope.  View was Grade 1 full glottis .  Number of attempts was 1.  Colorimetric CO2 detector was consistent with tracheal placement.   Complications/Tolerance None; patient tolerated the procedure well. Chest X-ray is ordered to verify placement.   EBL Minimal   Specimen(s) None

## 2023-04-19 NOTE — Progress Notes (Signed)
 eLink Physician-Brief Progress Note Patient Name: Chelsea [name] DOB: [birth year] MRN: [id number]   Date of Service  04/19/2023  HPI/Events of Note  72/F presented with sudden LOC this AM. Stoke alert called, and head CT was negative for bleed. Pt given TNK for ischemic stroke.  While in the ED, pt had sudden LOC, STAT CT head unfortunately showed acute pontine bleed. She was given tranexamic acid, cryo x2.  She was intubated for airway protection. Bronchoscopy performed for elevated airway pressures - found to have EDAC.   eICU Interventions  Stroke, with hemorrhagic transformation post TNK - TNK reversal with cryoprecipirate and tranexamic acid performed.  - Serial neurochecks per protocol - BP control - target SBP 130-150 - Maintain normoglycemia - Will follow further neurology plans/recommendations - Continue supportive measures.   Acute respiratory failure - Pt intubated for airway protection - Maintain TV 4-13ml/kg PBW, plateau pressures <30 - Placed on PEEP 8 for EDAC. Will titrate FiO2 to target SpO2 >90% - Continue sedation. Plan for daily interruption of sedation to facilitate evaluation of mental status.         [name] M DELA CRUZ 04/19/2023, 7:08 PM

## 2023-04-19 NOTE — ED Provider Notes (Signed)
 [place] EMERGENCY DEPARTMENT AT [facility] Provider Note   CSN: [id number] Arrival date & time: 04/19/23  1219     History  Chief Complaint  Patient presents with   Code Stroke    Chelsea [name] is a 73 y.o. female.  Pt with report of recently returning home from rehab, this AM was noted to be generally weak and less responsive than normal. Not clear from report when last felt at baseline, ?yesterday. Pt w hx dementia, hx prior cva, limited historian - level 5 caveat. Ems notes cbg 322. No report of trauma/fall. No report of fever. Pt not able to participate in history and ros.   The history is provided by the patient, medical records and the EMS personnel. The history is limited by the condition of the patient.       Home Medications Prior to Admission medications   Medication Sig Start Date End Date Taking? Authorizing Provider  albuterol (VENTOLIN HFA) 108 (90 Base) MCG/ACT inhaler INHALE 2 PUFFS into lungs EVERY 6 HOURS AS NEEDED FOR WHEEZING OR SHORTNESS OF BREATH Patient not taking: Reported on 03/26/2023 02/14/21   Elige Radon, MD  ASPIRIN EC ADULT LOW DOSE 81 MG tablet TAKE 1 TABLET BY MOUTH EVERY DAY Patient taking differently: Take 81 mg by mouth in the morning. 11/26/22   Champ Mungo, DO  atorvastatin (LIPITOR) 80 MG tablet Take 1 tablet (80 mg total) by mouth daily. Patient taking differently: Take 80 mg by mouth in the morning. 09/24/22   Champ Mungo, DO  ezetimibe (ZETIA) 10 MG tablet TAKE 1 TABLET BY MOUTH EVERY DAY Patient taking differently: Take 10 mg by mouth in the morning. 12/06/22   Monna Fam, MD  fluticasone [facility]) 50 MCG/ACT nasal spray USE 2 SPRAYS IN [facility] NOSTRIL EVERY DAY Patient not taking: Reported on 03/26/2023 12/18/21   Champ Mungo, DO  glucose blood (ACCU-CHEK GUIDE) test strip Use to check blood sugar two times per day. 09/24/22   Champ Mungo, DO  Insulin Pen Needle (PEN NEEDLES) 32G X 4 MM MISC 1 each by Does not apply route  daily in the afternoon. Use to inject victoza once daily. 09/24/22   Champ Mungo, DO  olmesartan (BENICAR) 20 MG tablet Take 20 mg by mouth in the morning. 03/25/23   [provider]  sodium bicarbonate 650 MG tablet TAKE 1 TABLET BY MOUTH 3 TIMES DAILY 10/10/22   Champ Mungo, DO  traZODone (DESYREL) 50 MG tablet TAKE 1/2 TABLET TO 1 BY MOUTH AT BEDTIME 10/02/22   Champ Mungo, DO  Lancet Device MISC 1 each by Does not apply route 3 (three) times daily. 04/23/11 04/05/14  Dede Query, MD      Allergies    Ace inhibitors, Aldactone [spironolactone], Angiotensin receptor blockers, Atarax [hydroxyzine], Nsaids, Penicillins, and Zestril [lisinopril]    Review of Systems   Review of Systems  Unable to perform ROS: Patient unresponsive    Physical Exam Updated Vital Signs BP 129/74   Pulse 76   Temp 98.2 F (36.8 C)   Resp 15   Wt 73.9 kg   SpO2 100%   BMI 27.11 kg/m  Physical Exam Vitals and nursing note reviewed.  Constitutional:      Appearance: Normal appearance. She is well-developed.  HENT:     Head: Atraumatic.     Nose: Nose normal.     Mouth/Throat:     Mouth: Mucous membranes are moist.  Eyes:     General: No scleral  icterus.    Conjunctiva/sclera: Conjunctivae normal.     Pupils: Pupils are equal, round, and reactive to light.  Neck:     Vascular: No carotid bruit.     Trachea: No tracheal deviation.     Comments: Trachea midline. Thyroid not grossly enlarged or tender. No mass felt. No bruits. No stiffness or rigidity.  Cardiovascular:     Rate and Rhythm: Normal rate and regular rhythm.     Pulses: Normal pulses.     Heart sounds: Normal heart sounds. No murmur heard.    No friction rub. No gallop.  Pulmonary:     Effort: Pulmonary effort is normal. No respiratory distress.     Breath sounds: Normal breath sounds.  Abdominal:     General: Bowel sounds are normal. There is no distension.     Palpations: Abdomen is soft. There is no mass.     Tenderness: There  is no abdominal tenderness. There is no guarding.  Genitourinary:    Comments: No cva tenderness.  Musculoskeletal:        General: No swelling or tenderness.     Cervical back: Normal range of motion and neck supple. No rigidity. No muscular tenderness.     Comments: He heel w dressing/silvadene, superficial pressure injury to right heel without sign of infection.  Good passive rom bil extremities without pain or focal bony tenderness. CTLS spine, non tender, aligned, no step off.   Lymphadenopathy:     Cervical: No cervical adenopathy.  Skin:    General: Skin is warm and dry.     Findings: No rash.  Neurological:     Mental Status: She is alert.     Comments: Appears awake, eyes open. Makes purposefully movement of extremities, does not follow commands.      ED Results / Procedures / Treatments   Labs (all labs ordered are listed, but only abnormal results are displayed) Results for orders placed or performed during the hospital encounter of 04/19/23  CBG monitoring, ED   Collection Time: 04/19/23 12:47 PM  Result Value Ref Range   Glucose-Capillary 236 (H) 70 - 99 mg/dL  CBG monitoring, ED   Collection Time: 04/19/23  2:54 PM  Result Value Ref Range   Glucose-Capillary 240 (H) 70 - 99 mg/dL  ECHOCARDIOGRAM COMPLETE   Collection Time: 04/19/23  2:56 PM  Result Value Ref Range   Weight 2,606.72 oz   BP 129/74 mmHg  Prepare cryoprecipitate   Collection Time: 04/19/23  3:09 PM  Result Value Ref Range   Unit Number [id number]    Blood Component Type POOL FIBR CMPLX 2D THW    Unit division 00    Status of Unit ISSUED    Transfusion Status OK TO TRANSFUSE    Unit Number [id number]    Blood Component Type POOL FIBR CMPLX 2D THW    Unit division 00    Status of Unit ALLOCATED    Transfusion Status OK TO TRANSFUSE   BPAM Cryoprecipitate   Collection Time: 04/19/23  3:09 PM  Result Value Ref Range   ISSUE DATE / TIME [id number]    Blood Product Unit Number  [id number]    PRODUCT CODE [id number]    Unit Type and Rh 6200    Blood Product Expiration Date 016010932355    Blood Product Unit Number [id number]    PRODUCT CODE [id number]    Unit Type and Rh 5100    Blood Product Expiration Date [id number]    *Note: Due to  a large number of results and/or encounters for the requested time period, some results have not been displayed. A complete set of results can be found in Results Review.   CT HEAD CODE STROKE WO CONTRAST` Result Date: 04/19/2023 CLINICAL DATA:  Code stroke. Mental status change. Unknown cause. Lethargy. Unresponsive. EXAM: CT HEAD WITHOUT CONTRAST TECHNIQUE: Contiguous axial images were obtained from the base of the skull through the vertex without intravenous contrast. RADIATION DOSE REDUCTION: This exam was performed according to the departmental dose-optimization program which includes automated exposure control, adjustment of the mA and/or kV according to patient size and/or use of iterative reconstruction technique. COMPARISON:  CT head without contrast 03/26/2023. FINDINGS: Brain: Moderate atrophy and white matter changes are stable. Remote lacunar infarcts are present in the basal ganglia. The ventricles are proportionate to the degree of atrophy. No significant extraaxial fluid collection is present. The brainstem and cerebellum are within normal limits. Midline structures are within normal limits. Vascular: Atherosclerotic calcifications are present within the cavernous internal carotid arteries bilaterally. No hyperdense vessel is present. Skull: Calvarium is intact. No focal lytic or blastic lesions are present. No significant extracranial soft tissue lesion is present. Sinuses/Orbits: The paranasal sinuses and mastoid air cells are clear. Right lens replacement is noted. The globes and orbits are otherwise within normal limits. ASPECTS [facility] Stroke Program Early CT Score) - Ganglionic level infarction (caudate, lentiform nuclei,  internal capsule, insula, M1-M3 cortex): 7/7 - Supraganglionic infarction (M4-M6 cortex): 3/3 Total score (0-10 with 10 being normal): 10/10 IMPRESSION: 1. No acute intracranial abnormality or significant interval change. 2. Stable atrophy and white matter disease. This likely reflects the sequela of chronic microvascular ischemia. 3. Aspects is 10/10. The above was relayed via text pager to Dr. Roda Shutters on 04/19/2023 at 13:07 . Electronically Signed   By: Marin Roberts M.D.   On: 04/19/2023 13:07   VAS [place] LOWER EXTREMITY VENOUS (DVT) (7a-7p) Result Date: 03/27/2023  Lower Venous DVT Study Patient Name:  SANIYA TRANCHINA  Date of Exam:   03/26/2023 Medical Rec #: [id number]        Accession #:    [id number] Date of Birth: 1950-04-14        Patient Gender: F Patient Age:   76 years Exam Location:  [facility] Procedure:      VAS [place] LOWER EXTREMITY VENOUS (DVT) Referring Phys: Maralyn Sago SMOOT --------------------------------------------------------------------------------  Indications: Pain.  Comparison Study: Previous study on 8.26.2019. Performing Technologist: Fernande Bras  Examination Guidelines: A complete evaluation includes B-mode imaging, spectral Doppler, color Doppler, and power Doppler as needed of all accessible portions of each vessel. Bilateral testing is considered an integral part of a complete examination. Limited examinations for reoccurring indications may be performed as noted. The reflux portion of the exam is performed with the patient in reverse Trendelenburg.  +---------+---------------+---------+-----------+----------+--------------+ RIGHT    CompressibilityPhasicitySpontaneityPropertiesThrombus Aging +---------+---------------+---------+-----------+----------+--------------+ CFV      Full           Yes      Yes                                 +---------+---------------+---------+-----------+----------+--------------+ SFJ      Full           Yes      Yes                                  +---------+---------------+---------+-----------+----------+--------------+  FV Prox  Full                                                        +---------+---------------+---------+-----------+----------+--------------+ FV Mid   Full                                                        +---------+---------------+---------+-----------+----------+--------------+ FV DistalFull                                                        +---------+---------------+---------+-----------+----------+--------------+ PFV      Full                                                        +---------+---------------+---------+-----------+----------+--------------+ POP      Full           Yes      Yes                                 +---------+---------------+---------+-----------+----------+--------------+ PTV      Full                                                        +---------+---------------+---------+-----------+----------+--------------+ PERO     Full                                                        +---------+---------------+---------+-----------+----------+--------------+   +----+---------------+---------+-----------+----------+--------------+ LEFTCompressibilityPhasicitySpontaneityPropertiesThrombus Aging +----+---------------+---------+-----------+----------+--------------+ CFV Full           Yes      Yes                                 +----+---------------+---------+-----------+----------+--------------+ SFJ Full           Yes      Yes                                 +----+---------------+---------+-----------+----------+--------------+     Summary: RIGHT: - There is no evidence of deep vein thrombosis in the lower extremity.  - No cystic structure found in the popliteal fossa.  LEFT: - No evidence of common femoral vein obstruction.   *See table(s) above for measurements and observations. Electronically signed by  Lemar Livings MD on 03/27/2023 at 10:49:56 AM.    Final  CT Head Wo Contrast Result Date: 03/26/2023 CLINICAL DATA:  Mental status change of unknown causes EXAM: CT HEAD WITHOUT CONTRAST TECHNIQUE: Contiguous axial images were obtained from the base of the skull through the vertex without intravenous contrast. RADIATION DOSE REDUCTION: This exam was performed according to the departmental dose-optimization program which includes automated exposure control, adjustment of the mA and/or kV according to patient size and/or use of iterative reconstruction technique. COMPARISON:  02/15/2023 FINDINGS: Brain: Brain atrophy as seen previously. Chronic small-vessel ischemic changes of the hemispheric white matter and basal ganglia. No sign of acute infarction, mass lesion, hemorrhage, hydrocephalus or extra-axial collection. Vascular: There is atherosclerotic calcification of the major vessels at the base of the brain. Skull: Negative Sinuses/Orbits: Clear/normal Other: None IMPRESSION: No acute CT finding. Atrophy and chronic small-vessel ischemic changes. Electronically Signed   By: Paulina Fusi M.D.   On: 03/26/2023 17:06   DG Foot Complete Right Result Date: 03/26/2023 CLINICAL DATA:  Right heel wound. EXAM: RIGHT FOOT COMPLETE - 3+ VIEW COMPARISON:  None Available. FINDINGS: Mild-to-moderate hallux valgus. There is irregularity and lucency within the posterior plantar heel soft tissues consistent with the reported soft tissue ulcer/wound. There appears to be approximately 10 mm of soft tissue in between the ulcer and the closest posterior plantar aspect of the calcaneus. No definite calcaneal cortical erosion is seen. Moderate plantar calcaneal heel spur. Mild tarsometatarsal subchondral sclerosis diffusely. No subcutaneous air. High-grade atherosclerotic calcifications. IMPRESSION: 1. Posterior plantar heel soft tissue ulcer/wound. No definite calcaneal cortical erosion is seen to indicate radiographic evidence of  acute osteomyelitis. 2. Mild-to-moderate hallux valgus. Electronically Signed   By: Neita Garnet M.D.   On: 03/26/2023 15:07     EKG EKG Interpretation Date/Time:  Friday April 19 2023 12:25:59 EST Ventricular Rate:  80 PR Interval:    QRS Duration:  78 QT Interval:  410 QTC Calculation: 473 R Axis:   40  Text Interpretation: Sinus rhythm with 1st degree A-V block Nonspecific ST abnormality Confirmed by Cathren Laine ([id number]) on 04/19/2023 12:36:03 PM  Radiology CT HEAD CODE STROKE WO CONTRAST` Result Date: 04/19/2023 CLINICAL DATA:  Code stroke. Mental status change. Unknown cause. Lethargy. Unresponsive. EXAM: CT HEAD WITHOUT CONTRAST TECHNIQUE: Contiguous axial images were obtained from the base of the skull through the vertex without intravenous contrast. RADIATION DOSE REDUCTION: This exam was performed according to the departmental dose-optimization program which includes automated exposure control, adjustment of the mA and/or kV according to patient size and/or use of iterative reconstruction technique. COMPARISON:  CT head without contrast 03/26/2023. FINDINGS: Brain: Moderate atrophy and white matter changes are stable. Remote lacunar infarcts are present in the basal ganglia. The ventricles are proportionate to the degree of atrophy. No significant extraaxial fluid collection is present. The brainstem and cerebellum are within normal limits. Midline structures are within normal limits. Vascular: Atherosclerotic calcifications are present within the cavernous internal carotid arteries bilaterally. No hyperdense vessel is present. Skull: Calvarium is intact. No focal lytic or blastic lesions are present. No significant extracranial soft tissue lesion is present. Sinuses/Orbits: The paranasal sinuses and mastoid air cells are clear. Right lens replacement is noted. The globes and orbits are otherwise within normal limits. ASPECTS [facility] Stroke Program Early CT Score) - Ganglionic level  infarction (caudate, lentiform nuclei, internal capsule, insula, M1-M3 cortex): 7/7 - Supraganglionic infarction (M4-M6 cortex): 3/3 Total score (0-10 with 10 being normal): 10/10 IMPRESSION: 1. No acute intracranial abnormality or significant interval change. 2. Stable atrophy and white matter disease.  This likely reflects the sequela of chronic microvascular ischemia. 3. Aspects is 10/10. The above was relayed via text pager to Dr. Roda Shutters on 04/19/2023 at 13:07 . Electronically Signed   By: Marin Roberts M.D.   On: 04/19/2023 13:07    Procedures Procedures    Medications Ordered in ED Medications  lactated ringers bolus 1,000 mL (has no administration in time range)   stroke: early stages of recovery book (has no administration in time range)  0.9 %  sodium chloride infusion (has no administration in time range)  acetaminophen (TYLENOL) tablet 650 mg (has no administration in time range)    Or  acetaminophen (TYLENOL) 160 MG/5ML solution 650 mg (has no administration in time range)    Or  acetaminophen (TYLENOL) suppository 650 mg (has no administration in time range)  pantoprazole (PROTONIX) injection 40 mg (has no administration in time range)  senna-docusate (Senokot-S) tablet 1 tablet (has no administration in time range)  0.9 %  sodium chloride infusion (Manually program via Guardrails IV Fluids) (has no administration in time range)  0.9 %  sodium chloride infusion (Manually program via Guardrails IV Fluids) (has no administration in time range)  clevidipine (CLEVIPREX) infusion 0.5 mg/mL (has no administration in time range)  tenecteplase (TNKASE) injection for Stroke 18 mg (18 mg Intravenous Given 04/19/23 1310)  tranexamic acid (CYKLOKAPRON) IVPB 1,000 mg (1,000 mg Intravenous New Bag/Given 04/19/23 1510)    ED Course/ Medical Decision Making/ A&P                                 Medical Decision Making Problems Addressed: Acute alteration in mental status: acute illness or  injury with systemic symptoms that poses a threat to life or bodily functions Encephalopathy acute: acute illness or injury with systemic symptoms that poses a threat to life or bodily functions Generalized weakness: acute illness or injury with systemic symptoms Hyperglycemia: acute illness or injury  Amount and/or Complexity of Data Reviewed Independent Historian: EMS    Details: hx External Data Reviewed: notes. Labs: ordered. Decision-making details documented in ED Course. Radiology: ordered and independent interpretation performed. Decision-making details documented in ED Course. ECG/medicine tests: ordered and independent interpretation performed. Decision-making details documented in ED Course.  Risk Prescription drug management. Decision regarding hospitalization.   Iv ns. Continuous pulse ox and cardiac monitoring. Labs ordered/sent. Imaging ordered.   Differential diagnosis includes cva, sepsis, uti, encephalopathy, etc. Dispo decision including potential need for admission considered - will get labs and imaging and reassess.   Reviewed nursing notes and prior charts for additional history. External reports reviewed. Additional history from: EMS.  LR bolus. Cxs sent. Labs sent.   Cardiac monitor: sinus rhythm, rate 80.  Labs reviewed/interpreted by me - glucose mildly high. Other labs pending. Followed up directly with RN, and via additional orders,  - all labs being facilitated now.   Daughter arrives, indicates was at baseline a couple hours prior to ED arrival. Given altered MS, ?new left weakness, code stroke activated, emergent neurology consult.   Xrays reviewed/interpreted by me - pnd.  CT reviewed/interpreted by me - no hem.   Neurology evaluated in ED and neurology decided to give patient TNK.   Recheck when back from imaging. No change in neuro exam as compared to former. Pt alert appearing, protecting airway, breathing comfortably. Afebrile, stable vitals.    1530, labs pending - follow up with nursing re labs again -  indicates labs have been sent.   Pt has been admitted by neurology.  Signed out to Dr Lockie Mola that labs remain pending, and to follow up on pending studies.   CRITICAL CARE RE: acute weakness and altered mental status, TNK therapy re code stroke Performed by: Suzi Roots Total critical care time:  45 minutes Critical care time was exclusive of separately billable procedures and treating other patients. Critical care was necessary to treat or prevent imminent or life-threatening deterioration. Critical care was time spent personally by me on the following activities: development of treatment plan with patient and/or surrogate as well as nursing, discussions with consultants, evaluation of patient's response to treatment, examination of patient, obtaining history from patient or surrogate, ordering and performing treatments and interventions, ordering and review of laboratory studies, ordering and review of radiographic studies, pulse oximetry and re-evaluation of patient's condition.           Final Clinical Impression(s) / ED Diagnoses Final diagnoses:  None    Rx / DC Orders ED Discharge Orders     None         Cathren Laine, MD 04/19/23 1540

## 2023-04-19 NOTE — Progress Notes (Addendum)
 Repeat CT head at 1855 (images personally reviewed): Interval increase in size of the acute intraparenchymal hemorrhage in the left posterior pons which now extends posteriorly into the fourth ventricle and measures up to 1.2 x 1.8 x 1.7 cm.  Discussed with Radiology and Dr. Roda Shutters of the stroke service. Will obtain a repeat CT head in 6 hours to reassess. If hydrocephalus develops, will call Neurosurgery. No role for hypertonic saline in brainstem hemorrhage.   TXA has been ordered by Dr. Roda Shutters.   Addendum: Repeat CT at 0114: Stable intraparenchymal hemorrhage in the pons with intraventricular extension.  Electronically signed: Dr. Caryl Pina

## 2023-04-19 NOTE — Progress Notes (Signed)
 1900:  Patient brought up from ED to 4NICU post 1900 CT scan.  Report given to PM RN.  Belongings include: pair of pants.
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
## 2023-04-19 NOTE — ED Notes (Signed)
 Cryoprecipitate transfusions for reversal:  1520 - 1550  [id number] 1550 - 1620 [id number]

## 2023-04-19 NOTE — Progress Notes (Signed)
 Spoke with Christiane Ha, RN regarding PICC.  Per Christiane Ha patient has adequate access at this time and would like to have PICC team reevaluate in the morning if PICC is needed.

## 2023-04-19 NOTE — Progress Notes (Signed)
 Pt was transported to CT and back via ventilator. Pt is stable at this time.
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
## 2023-04-19 NOTE — ED Triage Notes (Addendum)
 Pt bib ems from home; recently released from rehab; family got pt up this am, lethargic\, wouldn't eat; went unresponsive in chair, poss seizure activity, family reports repetitive mouth movement, not witnessed by ems; R side deficits from previous stroke; bp initially 80/40;  110/60 , 100 mls ns given pta; capnography 9; ems reports pt on beta blocker; 20 ga LH; a and o at baseline, ems reports hx dementia; acute onset approx 1120, family reports LSN between 10:30 and 11:00 am; noted gaze to R; not following commands; cbg 322, HR 70s sinus

## 2023-04-19 NOTE — Procedures (Signed)
 Bronchoscopy Procedure Note  LATESE DUFAULT  [id number]  [birth year]  Date:04/19/23  Time:6:53 PM   Provider Performing:Vanesa Renier Mechele Collin, MD  Procedure(s):  Flexible Bronchoscopy [phone])  Indication(s) Elevated peak pressures on vent  Consent Unable to obtain consent due to emergent nature of procedure.  Anesthesia Fentanyl, versed   Time Out Verified patient identification, verified procedure, site/side was marked, verified correct patient position, special equipment/implants available, medications/allergies/relevant history reviewed, required imaging and test results available.   Sterile Technique Usual hand hygiene, masks, gowns, and gloves were used   Procedure Description Bronchoscope advanced through endotracheal tube and into airway.  Airways were examined down to subsegmental level with findings noted below.    Findings: Severe EDAC throughout airway. No secretions. Normal mucosa.     Complications/Tolerance None; patient tolerated the procedure well. Chest X-ray is needed post procedure. PEEP increased to 8 with improvement   EBL Minimal   Specimen(s) None

## 2023-04-19 NOTE — Progress Notes (Signed)
 PHARMACIST CODE STROKE RESPONSE  Notified to mix TNK at 1307 by Dr. Roda Shutters TNK preparation completed at 1309  TNK dose = 18 mg IV over 5 seconds  Issues/delays encountered (if applicable): none  Lennie Muckle, PharmD PGY1 Pharmacy Resident 04/19/2023 1:11 PM

## 2023-04-19 NOTE — Progress Notes (Addendum)
 Pt reported sudden change of mental status, now obtunded. Not responsive. Stat CT showed brainstem ICH, likely TNK related. Looked back on her last MRI brain in 02/2022 and current MRI did show numerous microbleeds including brainstem. Ordered TXA stat and also ordered 2 pools of cryo. Check INR PT APTT and fibrinogen.   Marvel Plan, MD PhD Stroke Neurology 04/19/2023 3:18 PM  This patient is critically ill due to ICH post TNK and at significant risk of neurological worsening, death form hematoma expansion and brain herniation. This patient's care requires constant monitoring of vital signs, hemodynamics, respiratory and cardiac monitoring, review of multiple databases, neurological assessment, discussion with family, other specialists and medical decision making of high complexity. I spent 30 minutes of neurocritical care time in the care of this patient.

## 2023-04-19 NOTE — ED Notes (Signed)
 CCM consulted and paged to come manage airway. Pt continued to be suctioned and repositioned to maintain airway until CCM arrives for intubation evaluation.

## 2023-04-19 NOTE — Progress Notes (Signed)
 Received call at 255 from ED RN, pt now obtunded, bradycardic. SRN notified neurologist, team escorted pt to STAT CT head. Stroke team stayed and facilitated TNK reversal with TXA and Cryo as well as BP management and assisted with intubation. New BP goal 130-150. Family updated in change in pt's status by Dr. Roda Shutters. Bedside handoff with ED RN complete.

## 2023-04-19 NOTE — ED Notes (Signed)
 Patient transporting to MRI with primary RN.

## 2023-04-20 ENCOUNTER — Inpatient Hospital Stay (HOSPITAL_COMMUNITY)

## 2023-04-20 DIAGNOSIS — G8194 Hemiplegia, unspecified affecting left nondominant side: Secondary | ICD-10-CM

## 2023-04-20 DIAGNOSIS — R29810 Facial weakness: Secondary | ICD-10-CM

## 2023-04-20 DIAGNOSIS — I6389 Other cerebral infarction: Secondary | ICD-10-CM

## 2023-04-20 DIAGNOSIS — J398 Other specified diseases of upper respiratory tract: Secondary | ICD-10-CM | POA: Diagnosis not present

## 2023-04-20 DIAGNOSIS — R001 Bradycardia, unspecified: Secondary | ICD-10-CM | POA: Diagnosis not present

## 2023-04-20 DIAGNOSIS — I613 Nontraumatic intracerebral hemorrhage in brain stem: Secondary | ICD-10-CM | POA: Diagnosis not present

## 2023-04-20 DIAGNOSIS — J9600 Acute respiratory failure, unspecified whether with hypoxia or hypercapnia: Secondary | ICD-10-CM | POA: Diagnosis not present

## 2023-04-20 LAB — BPAM CRYOPRECIPITATE
Blood Product Expiration Date: 202503112359
Blood Product Expiration Date: 202503122359
ISSUE DATE / TIME: 202503071512
ISSUE DATE / TIME: 202503071543
Unit Type and Rh: 5100
Unit Type and Rh: 6200

## 2023-04-20 LAB — PREPARE CRYOPRECIPITATE
Unit division: 0
Unit division: 0

## 2023-04-20 LAB — CBC
HCT: 22.2 % — ABNORMAL LOW (ref 36.0–46.0)
Hemoglobin: 7.3 g/dL — ABNORMAL LOW (ref 12.0–15.0)
MCH: 27.8 pg (ref 26.0–34.0)
MCHC: 32.9 g/dL (ref 30.0–36.0)
MCV: 84.4 fL (ref 80.0–100.0)
Platelets: 274 10*3/uL (ref 150–400)
RBC: 2.63 MIL/uL — ABNORMAL LOW (ref 3.87–5.11)
RDW: 18.1 % — ABNORMAL HIGH (ref 11.5–15.5)
WBC: 10.6 10*3/uL — ABNORMAL HIGH (ref 4.0–10.5)
nRBC: 0 % (ref 0.0–0.2)

## 2023-04-20 LAB — ECHOCARDIOGRAM COMPLETE
Area-P 1/2: 3.42 cm2
Height: 65 in
S' Lateral: 2.3 cm
Weight: 2606.72 [oz_av]

## 2023-04-20 LAB — GLUCOSE, CAPILLARY
Glucose-Capillary: 118 mg/dL — ABNORMAL HIGH (ref 70–99)
Glucose-Capillary: 142 mg/dL — ABNORMAL HIGH (ref 70–99)
Glucose-Capillary: 169 mg/dL — ABNORMAL HIGH (ref 70–99)
Glucose-Capillary: 220 mg/dL — ABNORMAL HIGH (ref 70–99)
Glucose-Capillary: 228 mg/dL — ABNORMAL HIGH (ref 70–99)
Glucose-Capillary: 276 mg/dL — ABNORMAL HIGH (ref 70–99)

## 2023-04-20 LAB — LIPID PANEL
Cholesterol: 95 mg/dL (ref 0–200)
HDL: 36 mg/dL — ABNORMAL LOW (ref >40)
LDL Cholesterol: 47 mg/dL (ref 0–99)
Total CHOL/HDL Ratio: 2.6 ratio
Triglycerides: 58 mg/dL (ref <150)
VLDL: 12 mg/dL (ref 0–40)

## 2023-04-20 LAB — BASIC METABOLIC PANEL
Anion gap: 11 (ref 5–15)
BUN: 33 mg/dL — ABNORMAL HIGH (ref 8–23)
CO2: 22 mmol/L (ref 22–32)
Calcium: 8.1 mg/dL — ABNORMAL LOW (ref 8.9–10.3)
Chloride: 107 mmol/L (ref 98–111)
Creatinine, Ser: 1.42 mg/dL — ABNORMAL HIGH (ref 0.44–1.00)
GFR, Estimated: 39 mL/min — ABNORMAL LOW (ref >60)
Glucose, Bld: 145 mg/dL — ABNORMAL HIGH (ref 70–99)
Potassium: 3.9 mmol/L (ref 3.5–5.1)
Sodium: 140 mmol/L (ref 135–145)

## 2023-04-20 LAB — PHOSPHORUS
Phosphorus: 3.4 mg/dL (ref 2.5–4.6)
Phosphorus: 2.9 mg/dL (ref 2.5–4.6)

## 2023-04-20 LAB — MAGNESIUM
Magnesium: 1.2 mg/dL — ABNORMAL LOW (ref 1.7–2.4)
Magnesium: 1.3 mg/dL — ABNORMAL LOW (ref 1.7–2.4)

## 2023-04-20 LAB — HEMOGLOBIN A1C
Hgb A1c MFr Bld: 8 % — ABNORMAL HIGH (ref 4.8–5.6)
Mean Plasma Glucose: 182.9 mg/dL

## 2023-04-20 MED ORDER — VITAL HIGH PROTEIN PO LIQD: 1000.0000 mL | ORAL | Status: DC

## 2023-04-20 MED ORDER — LACTATED RINGERS IV SOLN: INTRAVENOUS | Status: AC

## 2023-04-20 MED ORDER — SODIUM CHLORIDE 0.9% FLUSH
10.0000 mL | Freq: Two times a day (BID) | INTRAVENOUS | Status: DC
  Administered 2023-04-20 (×2): 10 mL
  Administered 2023-04-21: 20 mL
  Administered 2023-04-21: 40 mL
  Administered 2023-04-22: 20 mL
  Administered 2023-04-22: 10 mL
  Administered 2023-04-23: 30 mL
  Administered 2023-04-23 – 2023-04-24 (×2): 10 mL

## 2023-04-20 MED ORDER — AMLODIPINE BESYLATE 10 MG PO TABS
10.0000 mg | ORAL_TABLET | Freq: Every day | ORAL | Status: DC
  Administered 2023-04-20 – 2023-04-24 (×5): 10 mg
  Filled 2023-04-20 (×5): qty 1

## 2023-04-20 MED ORDER — CARVEDILOL 12.5 MG PO TABS
25.0000 mg | ORAL_TABLET | Freq: Two times a day (BID) | ORAL | Status: DC
  Administered 2023-04-20 – 2023-04-24 (×6): 25 mg
  Filled 2023-04-20 (×6): qty 2

## 2023-04-20 MED ORDER — ATROPINE SULFATE 1 MG/10ML IJ SOSY
PREFILLED_SYRINGE | INTRAMUSCULAR | Status: AC
Start: 2023-04-20 — End: 2023-04-21
  Filled 2023-04-20: qty 10

## 2023-04-20 MED ORDER — DEXMEDETOMIDINE HCL IN NACL 400 MCG/100ML IV SOLN
0.0000 ug/kg/h | INTRAVENOUS | Status: DC
  Administered 2023-04-20: 0.4 ug/kg/h via INTRAVENOUS
  Filled 2023-04-20: qty 100

## 2023-04-20 MED ORDER — CHLORHEXIDINE GLUCONATE CLOTH 2 % EX PADS
6.0000 | MEDICATED_PAD | Freq: Every day | CUTANEOUS | Status: DC
  Administered 2023-04-19 – 2023-04-24 (×7): 6 via TOPICAL

## 2023-04-20 MED ORDER — ATROPINE SULFATE 1 MG/ML IV SOLN: 0.5000 mg | Freq: Once | INTRAVENOUS | Status: DC

## 2023-04-20 MED ORDER — ATROPINE SULFATE 1 MG/ML IV SOLN
0.5000 mg | INTRAVENOUS | Status: DC | PRN
  Filled 2023-04-20 (×2): qty 1

## 2023-04-20 MED ORDER — PROSOURCE TF20 ENFIT COMPATIBL EN LIQD
60.0000 mL | Freq: Every day | ENTERAL | Status: DC
  Administered 2023-04-20 – 2023-04-24 (×5): 60 mL
  Filled 2023-04-20 (×5): qty 60

## 2023-04-20 MED ORDER — SODIUM CHLORIDE 0.9% FLUSH: 10.0000 mL | INTRAVENOUS | Status: DC | PRN

## 2023-04-20 MED ORDER — THIAMINE MONONITRATE 100 MG PO TABS
100.0000 mg | ORAL_TABLET | Freq: Every day | ORAL | Status: AC
  Administered 2023-04-20 – 2023-04-24 (×5): 100 mg
  Filled 2023-04-20 (×5): qty 1

## 2023-04-20 MED ORDER — LACTATED RINGERS IV BOLUS: 500.0000 mL | Freq: Once | INTRAVENOUS | Status: DC

## 2023-04-20 MED ORDER — OSMOLITE 1.5 CAL PO LIQD
1000.0000 mL | ORAL | Status: DC
  Administered 2023-04-20 – 2023-04-21 (×2): 1000 mL

## 2023-04-20 MED ORDER — LABETALOL HCL 5 MG/ML IV SOLN
20.0000 mg | INTRAVENOUS | Status: DC | PRN
  Administered 2023-04-20 – 2023-04-21 (×2): 20 mg via INTRAVENOUS
  Filled 2023-04-20 (×2): qty 4

## 2023-04-20 MED ORDER — NOREPINEPHRINE 4 MG/250ML-% IV SOLN
2.0000 ug/min | INTRAVENOUS | Status: DC
Start: 2023-04-20 — End: 2023-04-21
  Filled 2023-04-20: qty 250

## 2023-04-20 MED ORDER — SODIUM CHLORIDE 0.9 % IV SOLN: 250.0000 mL | INTRAVENOUS | Status: AC

## 2023-04-20 NOTE — Progress Notes (Signed)
 Peripherally Inserted Central Catheter Placement  The IV Nurse has discussed with the patient and/or persons authorized to consent for the patient, the purpose of this procedure and the potential benefits and risks involved with this procedure.  The benefits include less needle sticks, lab draws from the catheter, and the patient may be discharged home with the catheter. Risks include, but not limited to, infection, bleeding, blood clot (thrombus formation), and puncture of an artery; nerve damage and irregular heartbeat and possibility to perform a PICC exchange if needed/ordered by physician.  Alternatives to this procedure were also discussed.  Bard Power PICC patient education guide, fact sheet on infection prevention and patient information card has been provided to patient /or left at bedside.    Consent obtained with daughter at bedside.  PICC Placement Documentation  PICC Triple Lumen 04/20/23 Right Brachial 35 cm 0 cm (Active)  Indication for Insertion or Continuance of Line Vasoactive infusions 04/20/23 0800  Exposed Catheter (cm) 0 cm 04/20/23 0800  Site Assessment Clean, Dry, Intact 04/20/23 0800  Lumen #1 Status Flushed;Saline locked;Blood return noted 04/20/23 0800  Lumen #2 Status Flushed;Saline locked;Blood return noted 04/20/23 0800  Lumen #3 Status Flushed;Saline locked;Blood return noted 04/20/23 0800  Dressing Type Transparent;Securing device 04/20/23 0800  Dressing Status Antimicrobial disc/dressing in place;Clean, Dry, Intact 04/20/23 0800  Line Care Connections checked and tightened 04/20/23 0800  Line Adjustment (NICU/IV Team Only) No 04/20/23 0800  Dressing Intervention New dressing 04/20/23 0800  Dressing Change Due 04/27/23 04/20/23 0800       Franne Grip Renee 04/20/2023, 9:00 AM

## 2023-04-20 NOTE — Progress Notes (Signed)
 PT Cancellation Note  Patient Details Name: Chelsea [name] MRN: [id number] DOB: 1951/01/21   Cancelled Treatment:    Reason Eval/Treat Not Completed: Patient not medically ready;Active bedrest order (pt intubated)  Lillia Pauls, PT, DPT Acute Rehabilitation Services Office [phone]    Norval Morton 04/20/2023, 8:01 AM

## 2023-04-20 NOTE — Progress Notes (Signed)
 1952: Clarified SBP goals with Dr. Otelia Limes. SBP goals 130-150.  2359: Patient had multiple episodes of bradycardia, with heart rate dropping into the 30s. Atropine pulled from pyxis. Carotid pulse present, BP maintained. E-link MD and neuro MD made aware. E-link MD put in PRN orders for atropine.   0130: Dr. Otelia Limes notified that CT is done.

## 2023-04-20 NOTE — Progress Notes (Addendum)
 STROKE TEAM PROGRESS NOTE   INTERIM HISTORY/SUBJECTIVE Intubated, purposefully moving upper and lower extremities. Family at the bedside.  Neuro exam improving, still drowsy but can be aroused to follow few commands intermittently.  Moves all 4 extremities purposefully.. Reassess extubation tomorrow.   OBJECTIVE  CBC    Component Value Date/Time   WBC 10.5 04/19/2023 1234   RBC 3.08 (L) 04/19/2023 1234   HGB 8.5 (L) 04/19/2023 1654   HGB 11.0 (L) 09/24/2022 1107   HCT 25.0 (L) 04/19/2023 1654   HCT 35.4 09/24/2022 1107   PLT 271 04/19/2023 1234   PLT 248 09/24/2022 1107   MCV 88.0 04/19/2023 1234   MCV 86 09/24/2022 1107   MCH 27.6 04/19/2023 1234   MCHC 31.4 04/19/2023 1234   RDW 18.1 (H) 04/19/2023 1234   RDW 16.0 (H) 09/24/2022 1107   LYMPHSABS 2.5 04/19/2023 1234   LYMPHSABS 2.0 10/26/2020 1007   MONOABS 0.6 04/19/2023 1234   EOSABS 0.2 04/19/2023 1234   EOSABS 0.5 (H) 10/26/2020 1007   BASOSABS 0.1 04/19/2023 1234   BASOSABS 0.1 10/26/2020 1007    BMET    Component Value Date/Time   NA 139 04/19/2023 1654   NA 141 09/24/2022 1107   K 4.8 04/19/2023 1654   CL 103 04/19/2023 1234   CO2 24 04/19/2023 1234   GLUCOSE 281 (H) 04/19/2023 1234   BUN 34 (H) 04/19/2023 1234   BUN 52 (H) 09/24/2022 1107   CREATININE 1.55 (H) 04/19/2023 1234   CREATININE 1.05 12/25/2013 1551   CALCIUM 8.2 (L) 04/19/2023 1234   EGFR 30 (L) 09/24/2022 1107   GFRNONAA 35 (L) 04/19/2023 1234   GFRNONAA 56 (L) 12/25/2013 1551    IMAGING past 24 hours Portable Chest xray Result Date: 04/20/2023 CLINICAL DATA:  73 year old female intubated. Code stroke, pontine hemorrhage. EXAM: PORTABLE CHEST 1 VIEW COMPARISON:  Portable chest 04/19/2023 and earlier. FINDINGS: Portable AP semi upright view at 0621 hours. Endotracheal tube tip in good position between the clavicles and carina. Enteric tube in good position with side hole the level of the gastric body. Lung volumes and mediastinal contours  appear normal. Allowing for portable technique the lungs are clear. Negative visible bowel gas. Stable visualized osseous structures. IMPRESSION: 1. Satisfactory endotracheal and enteric tube placement. 2. No acute cardiopulmonary abnormality. Electronically Signed   By: Odessa Fleming M.D.   On: 04/20/2023 06:54   CT HEAD POST STROKE FOLLOWUP/TIMED/STAT READ Result Date: 04/20/2023 CLINICAL DATA:  Code stroke.  pontine hemorrhage EXAM: CT HEAD WITHOUT CONTRAST TECHNIQUE: Contiguous axial images were obtained from the base of the skull through the vertex without intravenous contrast. RADIATION DOSE REDUCTION: This exam was performed according to the departmental dose-optimization program which includes automated exposure control, adjustment of the mA and/or kV according to patient size and/or use of iterative reconstruction technique. COMPARISON:  CT head March 7, 25. FINDINGS: Brain: Stable intraparenchymal hemorrhage in the pons with intraventricular extension. No evidence of acute large vascular territory infarct, mass lesion, midline shift or hydrocephalus. Similar patchy white matter hypodensities, nonspecific but compatible with chronic microvascular ischemic change. Vascular: No hyperdense vessel identified. Calcific atherosclerosis. Skull: No acute fracture. Sinuses/Orbits: No acute finding. IMPRESSION: Stable intraparenchymal hemorrhage in the pons with intraventricular extension. Electronically Signed   By: Feliberto Harts M.D.   On: 04/20/2023 01:36   DG Abdomen 1 View Result Date: 04/19/2023 CLINICAL DATA:  Status post intubation and orogastric tube placement. EXAM: ABDOMEN - 1 VIEW COMPARISON:  None Available. FINDINGS: An enteric  tube is seen with its distal tip overlying the expected region of the body of the stomach. The distal side hole is approximately 7.3 cm distal to the expected region of the gastroesophageal junction. The bowel gas pattern is normal. No radio-opaque calculi or other significant  radiographic abnormality are seen. IMPRESSION: Enteric tube positioning, as described above. Electronically Signed   By: Aram Candela M.D.   On: 04/19/2023 20:34   DG Chest Port 1 View Result Date: 04/19/2023 CLINICAL DATA:  Status post intubation and orogastric tube placement. EXAM: PORTABLE CHEST 1 VIEW COMPARISON:  February 15, 2023 FINDINGS: An endotracheal tube is seen with its distal tip approximately 3.9 cm from the carina. An orogastric tube is in place with its distal end extending into the body of the stomach. The heart size and mediastinal contours are within normal limits. Both lungs are clear. The visualized skeletal structures are unremarkable. IMPRESSION: Endotracheal tube and orogastric tube positioning, as described above. Electronically Signed   By: Aram Candela M.D.   On: 04/19/2023 20:33   CT HEAD POST STROKE FOLLOWUP/TIMED/STAT READ Addendum Date: 04/19/2023 ADDENDUM REPORT: 04/19/2023 19:16 ADDENDUM: Findings discussed with Dr. Otelia Limes via telephone at 7:15 p.m. Electronically Signed   By: Feliberto Harts M.D.   On: 04/19/2023 19:16   Result Date: 04/19/2023 CLINICAL DATA:  Code stroke.  Neuro deficit, acute, stroke suspected EXAM: CT HEAD WITHOUT CONTRAST TECHNIQUE: Contiguous axial images were obtained from the base of the skull through the vertex without intravenous contrast. RADIATION DOSE REDUCTION: This exam was performed according to the departmental dose-optimization program which includes automated exposure control, adjustment of the mA and/or kV according to patient size and/or use of iterative reconstruction technique. COMPARISON:  CT head April 19, 2023. FINDINGS: Brain: Interval increase in size of the acute intraparenchymal hemorrhage in the left posterior pons which now extends posteriorly into the fourth ventricle and measures up to 1.2 x 1.8 x 1.7 cm. No evidence of acute large vascular territory infarct, mass lesion, midline shift or hydrocephalus. Vascular: No  hyperdense vessel. Calcific atherosclerosis. Skull: No acute fracture. Sinuses/Orbits: Clear sinuses.  No acute orbital findings. IMPRESSION: Interval increase in size of the acute intraparenchymal hemorrhage in the left posterior pons which now extends posteriorly into the fourth ventricle and measures up to 1.2 x 1.8 x 1.7 cm. Electronically Signed: By: Feliberto Harts M.D. On: 04/19/2023 19:10   MR ANGIO HEAD WO CONTRAST Result Date: 04/19/2023 CLINICAL DATA:  Stroke, follow-up. EXAM: MRA HEAD WITHOUT CONTRAST TECHNIQUE: Angiographic images of the Circle of Willis were acquired using MRA technique without intravenous contrast. COMPARISON:  CT head without contrast 04/19/2023 at 12:57 p.m. FINDINGS: Anterior circulation: Mild atherosclerotic irregularity is present in the cavernous internal carotid arteries bilaterally. The study is somewhat degraded by patient motion. The A1 and M1 segments are normal. MCA bifurcations are within normal limits. Moderate focal stenosis is present scratched at moderate stenoses are present in the A2 segments bilaterally, right greater than left. The more distal ACA branch vessels are within normal limits. Posterior circulation: The vertebrobasilar junction and basilar artery are normal. The superior cerebellar artery scratched at the superior cerebellar arteries are patent bilaterally. PCA branch vessels are normal bilaterally. Anatomic variants: None Other: None. IMPRESSION: 1. Moderate focal stenoses in the A2 segments bilaterally, right greater than left. 2. Mild atherosclerotic irregularity in the cavernous internal carotid arteries bilaterally. 3. No other significant proximal stenosis, aneurysm, or branch vessel occlusion within the Circle of Willis. Electronically Signed  By: Marin Roberts M.D.   On: 04/19/2023 16:11   MR BRAIN WO CONTRAST Result Date: 04/19/2023 CLINICAL DATA:  Stroke follow-up. Sudden onset of loss of consciousness. Patient had rightward  gaze, left facial droop and left-sided weakness. EXAM: MRI HEAD WITHOUT CONTRAST TECHNIQUE: Multiplanar, multiecho pulse sequences of the brain and surrounding structures were obtained without intravenous contrast. COMPARISON:  CT head without contrast 01/19/2024 at 12:54 p.m. FINDINGS: Brain: No acute infarct, hemorrhage, or mass lesion is present. Moderate atrophy and white matter changes are present bilaterally. Subcortical encephalomalacia the high anterior frontal lobes is present bilaterally. Deep brain nuclei are within normal limits. Susceptibility weighted images demonstrate numerous punctate foci of susceptibility throughout both cerebral hemispheres. The greatest density is in the basal ganglia and brainstem. Vascular: Flow is present in the major intracranial arteries. Skull and upper cervical spine: The craniocervical junction is normal. Upper cervical spine is within normal limits. Marrow signal is unremarkable. Sinuses/Orbits: The paranasal sinuses and mastoid air cells are clear. Bilateral lens replacements are noted. Globes and orbits are otherwise unremarkable. IMPRESSION: 1. No acute intracranial abnormality. 2. Moderate atrophy and white matter disease likely reflects the sequela of chronic microvascular ischemia. 3. Subcortical encephalomalacia the high anterior frontal lobes bilaterally likely reflects the sequela of remote trauma. 4. Numerous punctate foci of susceptibility throughout both cerebral hemispheres. The greatest density is in the basal ganglia and brainstem. This likely reflects the sequela of chronic microvascular ischemia. Electronically Signed   By: Marin Roberts M.D.   On: 04/19/2023 15:54   CT Head Wo Contrast Result Date: 04/19/2023 CLINICAL DATA:  Mental status change.  Status post TNK. EXAM: CT HEAD WITHOUT CONTRAST TECHNIQUE: Contiguous axial images were obtained from the base of the skull through the vertex without intravenous contrast. RADIATION DOSE REDUCTION:  This exam was performed according to the departmental dose-optimization program which includes automated exposure control, adjustment of the mA and/or kV according to patient size and/or use of iterative reconstruction technique. COMPARISON:  CT head without contrast 04/19/2023 at 12:54 p.m. FINDINGS: Brain: An acute 16 mm hemorrhage is present the posterior left pons. No other acute hemorrhage is present. Moderate chronic atrophy and white matter changes are present. Subcortical encephalomalacia is present in the high anterior frontal lobes bilaterally. A remote lacunar infarct is present within the left thalamus. The hemorrhage creates some mass effect on the fourth ventricle. No intraventricular hemorrhage is present. Cerebellum is within normal limits. Midline structures are within normal limits. Vascular: Atherosclerotic calcifications are present within the cavernous internal carotid arteries bilaterally. No hyperdense vessel is present. Skull: Calvarium is intact. No focal lytic or blastic lesions are present. No significant extracranial soft tissue lesion is present. Sinuses/Orbits: The paranasal sinuses and mastoid air cells are clear. A right lens replacement is present. The globes and orbits are otherwise within normal limits. IMPRESSION: 1. Acute 16 mm hemorrhage in the posterior left pons. 2. The hemorrhage creates some mass effect on the fourth ventricle. No intraventricular hemorrhage is present. 3. Moderate chronic atrophy and white matter disease. 4. Subcortical encephalomalacia in the high anterior frontal lobes bilaterally. 5. Remote lacunar infarct of the left thalamus. The above was relayed via text pager to Dr. Marvel Plan on 04/19/2023 at 15:45 . Electronically Signed   By: Marin Roberts M.D.   On: 04/19/2023 15:50   [place] EKG SITE RITE Result Date: 04/19/2023 If Site Rite image not attached, placement could not be confirmed due to current cardiac rhythm.  CT HEAD CODE STROKE  WO  CONTRAST` Result Date: 04/19/2023 CLINICAL DATA:  Code stroke. Mental status change. Unknown cause. Lethargy. Unresponsive. EXAM: CT HEAD WITHOUT CONTRAST TECHNIQUE: Contiguous axial images were obtained from the base of the skull through the vertex without intravenous contrast. RADIATION DOSE REDUCTION: This exam was performed according to the departmental dose-optimization program which includes automated exposure control, adjustment of the mA and/or kV according to patient size and/or use of iterative reconstruction technique. COMPARISON:  CT head without contrast 03/26/2023. FINDINGS: Brain: Moderate atrophy and white matter changes are stable. Remote lacunar infarcts are present in the basal ganglia. The ventricles are proportionate to the degree of atrophy. No significant extraaxial fluid collection is present. The brainstem and cerebellum are within normal limits. Midline structures are within normal limits. Vascular: Atherosclerotic calcifications are present within the cavernous internal carotid arteries bilaterally. No hyperdense vessel is present. Skull: Calvarium is intact. No focal lytic or blastic lesions are present. No significant extracranial soft tissue lesion is present. Sinuses/Orbits: The paranasal sinuses and mastoid air cells are clear. Right lens replacement is noted. The globes and orbits are otherwise within normal limits. ASPECTS [facility] Stroke Program Early CT Score) - Ganglionic level infarction (caudate, lentiform nuclei, internal capsule, insula, M1-M3 cortex): 7/7 - Supraganglionic infarction (M4-M6 cortex): 3/3 Total score (0-10 with 10 being normal): 10/10 IMPRESSION: 1. No acute intracranial abnormality or significant interval change. 2. Stable atrophy and white matter disease. This likely reflects the sequela of chronic microvascular ischemia. 3. Aspects is 10/10. The above was relayed via text pager to Dr. Roda Shutters on 04/19/2023 at 13:07 . Electronically Signed   By: Marin Roberts M.D.   On: 04/19/2023 13:07    Vitals:   04/20/23 0745 04/20/23 0800 04/20/23 0815 04/20/23 0821  BP: 138/61 (!) 160/56 (!) 142/63   Pulse: 82 71 83 80  Resp: 18 18 18 18   Temp:  99.7 F (37.6 C)    TempSrc:  Axillary    SpO2: 100% 100% 100% 100%  Weight:      Height:         PHYSICAL EXAM General:  Eyes open Psych:  Mood and affect appropriate for situation CV: Regular rate and rhythm on monitor Respiratory: Mechanically ventilated  GI: Abdomen soft and nontender   NEURO:  Mental Status: Intubated, no sedation. No verbal output. Purposeful, not following commands Speech/Language: speech is without dysarthria or aphasia.  Naming, repetition, fluency, and comprehension intact.  Cranial Nerves:  II: PERRL. Does not blink to threat III, IV, VI: Eyes midline  V: Sensation is intact to light touch and symmetrical to face.  VII: Face appears symmetric VIII: hearing intact to voice. IX, X: Cough and gag  XI: Head midline  XII: tongue is midline without fasciculations. Motor: moving all extremities antigravity.  Tone: is normal and bulk is normal Sensation- Intact to light touch bilaterally. Extinction absent to light touch to DSS.   Coordination: FTN intact bilaterally, HKS: no ataxia in BLE.No drift.  Gait- deferred  Most Recent NIH 70     ASSESSMENT/PLAN  [name] is a 73 y.o. female with history of HTN, HLD, DM, CVA 2022, CKD, right heel ulceration, recent presyncope just discharged from rehab presented to ED for code stroke due to right gaze, left facial droop, left-sided weakness    NIH on Admission 14  Aborted stroke with hemorrhagic transformation in the setting of TNK  Code Stroke CT head - No acute intracranial abnormality or significant interval change. Stable atrophy and  white matter disease. This likely reflects the sequela of chronic microvascular ischemia. Aspects is 10/10. MRI  No acute intracranial abnormality.  MRA   Moderate focal  stenoses in the A2 segments bilaterally, right greater than left CT Head - Acute 16 mm hemorrhage in the posterior left pons. The hemorrhage creates some mass effect on the fourth ventricle. No intraventricular hemorrhage is present. CT Head- Interval increase in size of the acute intraparenchymal hemorrhage in the left posterior pons which now extends posteriorly into the fourth ventricle and measures up to 1.2 x 1.8 x 1.7 cm. LDL 47 HgbA1c 8.0 VTE prophylaxis - SCDs aspirin 81 mg daily prior to admission, now on No antithrombotic  Therapy recommendations:  Pending Disposition:  Pending   Hx of Stroke CVA 2019- Multiple bilateral infarcts appear embolic secondary to unknown source, suspicious for atrial fibrillation - right sided deficits Follows with [facility]   Respiratory Failure  Intubated in the ED Unresponsive, bradycardic PCCM following - VAP, WUA/SBT per protocol Significant tracheomalacia- failed SBT   Hypertension Home meds: Amlodipine, carvedilol, chlorthalidone, benicar BP goal 130-150 for 24 hours and then less than 160  Hyperlipidemia Home meds:  Atorvastatin, resumed in hospital LDL 47, goal < 70 Continue statin at discharge  Diabetes type II Uncontrolled Home meds:  Insulin  HgbA1c 8.0, goal < 7.0 CBGs SSI Recommend close follow-up with PCP for better DM control  CKD Stage 3b Cr 1.42, GFR 39 Gentle hydration, replete electrolytes   Other Stroke Risk Factors ETOH use, alcohol level No results found for requested labs within last 1095 days., advised to drink no more than 1 drink(s) a day Obesity, Body mass index is 27.11 kg/m., BMI >/= 30 associated with increased stroke risk, recommend weight loss, diet and exercise as appropriate    Hospital day # 1  Patient seen and examined by NP/APP with MD. MD to update note as needed.   Elmer Picker, DNP, FNP-BC [facility] Pager: [phone]  I have personally obtained history,examined this  patient, reviewed notes, independently viewed imaging studies, participated in medical decision making and plan of care.ROS completed by me personally and pertinent positives fully documented  I have made any additions or clarifications directly to the above note. Agree with note above.  Patient remains intubated for respiratory failure.  Even though neurological exam is improving and she is moving all 4 extremities she may not be able to survive extubation because of tracheomalacia and difficult airway.  Family may need to decide on one-way extubation versus DNR or not and may need tracheostomy if she requires reintubation.  Continue strict blood pressure control systolic goal below 160.  Long discussion of the patient's family at the bedside and answered questions.  Discussed with Dr. Everardo All critical care medicine. This patient is critically ill and at significant risk of neurological worsening, death and care requires constant monitoring of vital signs, hemodynamics,respiratory and cardiac monitoring, extensive review of multiple databases, frequent neurological assessment, discussion with family, other specialists and medical decision making of high complexity.I have made any additions or clarifications directly to the above note.This critical care time does not reflect procedure time, or teaching time or supervisory time of PA/NP/Med Resident etc but could involve care discussion time.  I spent 30 minutes of neurocritical care time  in the care of  this patient.     Delia Heady, MD Medical Director [facility] Pager: [phone] 04/20/2023 4:32 PM   To contact Stroke Continuity provider, please refer to [email or web address].  After hours, contact General Neurology

## 2023-04-20 NOTE — Progress Notes (Signed)
 OT Cancellation Note  Patient Details Name: Chelsea [name] MRN: [id number] DOB: May 22, 1950   Cancelled Treatment:    Reason Eval/Treat Not Completed: Active bedrest order;Patient not medically ready Patient remains intubated at this time, OT will follow back to complete evaluation when medically appropriate.   Chelsea [name], OTR/L Acute Rehabilitation Services [phone]   Chelsea [name] 04/20/2023, 8:47 AM
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
## 2023-04-20 NOTE — Progress Notes (Signed)
 1745- This RN engaged in a conversation with CCM, Dr. Everardo All, to progress the patient from fentanyl to Precedex.   1815- The patient tolerated Precedex for approximately ~15 minutes, with the dose being titrated no higher than 0.6 for agitation. Patient became bradycardic, and hypotensive (77/38). Atropine at the bedside, and verbal orders from Dr. Everardo All to administer a LR bolus, and a levophed order is now in place, if needed.  Mammie Russian, RN

## 2023-04-20 NOTE — Progress Notes (Signed)
 Initial Nutrition Assessment  DOCUMENTATION CODES:   Not applicable  INTERVENTION:  Initiate tube feeding via OGT: Start Osmolite 1.5 at 38ml/h and advance by 10ml q8h to a goal rate of 32ml/h (1080 ml per day) Prosource TF20 60 ml once daily Provides 1700 kcal, 87 gm protein, 823 ml free water daily  Suspect pt is at elevated refeeding risk, recommend monitoring magnesium, potassium and phosphorus BID for 4 occurrences; MD to replete as appropriate  Thiamine 100mg  daily x5 days   NUTRITION DIAGNOSIS:   Inadequate oral intake related to acute illness as evidenced by NPO status.  GOAL:   Patient will meet greater than or equal to 90% of their needs  MONITOR:   Vent status, Labs, Weight trends, TF tolerance  REASON FOR ASSESSMENT:   Consult, Ventilator Enteral/tube feeding initiation and management  ASSESSMENT:   Pt recently discharged from rehab and admitted as code stroke with lethargy, possible seizure activity.  PMH significant for  HTN, HLD, prior CVA (2022), T2DM, CKD stage 3b, fournier's gangrene, prior EtOH abuse, panic attacks.  3/7: admitted, CT head negative for ICH s/p TNK, acute neuro changes, repeat CT with findings of brainstem ICH, s/p TXA/cryo x2; intubated  Patient is currently intubated on ventilator support MV: 8.5 L/min Temp (24hrs), Avg:97.8 F (36.6 C), Min:95 F (35 C), Max:99.7 F (37.6 C)  Suspect admit weight to be transferred forward from prior weight on 2.12. Notably it appears pt's weight to have declined 24.2% between 11/16/22-03/27/23 which if accurate is clinically significant for time frame.   Medications: colace BID, SSI 0-15 units q4h, miralax daily,  Drips: Levo @ 2 mcg/min  Labs: BUN 34 Cr 1.55 GFR 35 CBG's 118-293 x24 hours HgbA1c 8.0% (3/8)  NUTRITION - FOCUSED PHYSICAL EXAM: RD working remotely. Deferred to in person assessment.   Diet Order:   Diet Order             Diet NPO time specified  Diet effective now                    EDUCATION NEEDS:   No education needs have been identified at this time  Skin:  Skin Assessment: Reviewed RN Assessment  Last BM:  3/8 (type 6 medium)  Height:   Ht Readings from Last 1 Encounters:  04/19/23 5\' 5"  (1.651 m)    Weight:   Wt Readings from Last 1 Encounters:  04/19/23 73.9 kg   BMI:  Body mass index is 27.11 kg/m.  Estimated Nutritional Needs:   Kcal:  1600-1800  Protein:  80-95g  Fluid:  >/=1.6L  Drusilla Kanner, RDN, LDN Clinical Nutrition

## 2023-04-20 NOTE — Progress Notes (Signed)
 Transported pt from ICU to ct via vent with no incident

## 2023-04-21 DIAGNOSIS — J9600 Acute respiratory failure, unspecified whether with hypoxia or hypercapnia: Secondary | ICD-10-CM | POA: Diagnosis not present

## 2023-04-21 DIAGNOSIS — I613 Nontraumatic intracerebral hemorrhage in brain stem: Secondary | ICD-10-CM | POA: Diagnosis not present

## 2023-04-21 DIAGNOSIS — D62 Acute posthemorrhagic anemia: Secondary | ICD-10-CM

## 2023-04-21 DIAGNOSIS — J398 Other specified diseases of upper respiratory tract: Secondary | ICD-10-CM | POA: Diagnosis not present

## 2023-04-21 DIAGNOSIS — R29810 Facial weakness: Secondary | ICD-10-CM | POA: Diagnosis not present

## 2023-04-21 DIAGNOSIS — G8194 Hemiplegia, unspecified affecting left nondominant side: Secondary | ICD-10-CM | POA: Diagnosis not present

## 2023-04-21 LAB — CBC
HCT: 20.7 % — ABNORMAL LOW (ref 36.0–46.0)
HCT: 24.9 % — ABNORMAL LOW (ref 36.0–46.0)
Hemoglobin: 6.5 g/dL — CL (ref 12.0–15.0)
Hemoglobin: 7.9 g/dL — ABNORMAL LOW (ref 12.0–15.0)
MCH: 27.2 pg (ref 26.0–34.0)
MCH: 28.5 pg (ref 26.0–34.0)
MCHC: 31.4 g/dL (ref 30.0–36.0)
MCHC: 31.7 g/dL (ref 30.0–36.0)
MCV: 86.6 fL (ref 80.0–100.0)
MCV: 89.9 fL (ref 80.0–100.0)
Platelets: 219 10*3/uL (ref 150–400)
Platelets: 214 10*3/uL (ref 150–400)
RBC: 2.39 MIL/uL — ABNORMAL LOW (ref 3.87–5.11)
RBC: 2.77 MIL/uL — ABNORMAL LOW (ref 3.87–5.11)
RDW: 18.2 % — ABNORMAL HIGH (ref 11.5–15.5)
RDW: 16.7 % — ABNORMAL HIGH (ref 11.5–15.5)
WBC: 9.7 10*3/uL (ref 4.0–10.5)
WBC: 11 10*3/uL — ABNORMAL HIGH (ref 4.0–10.5)
nRBC: 0 % (ref 0.0–0.2)
nRBC: 0 % (ref 0.0–0.2)

## 2023-04-21 LAB — PHOSPHORUS: Phosphorus: 2.2 mg/dL — ABNORMAL LOW (ref 2.5–4.6)

## 2023-04-21 LAB — BASIC METABOLIC PANEL
Anion gap: 9 (ref 5–15)
BUN: 33 mg/dL — ABNORMAL HIGH (ref 8–23)
CO2: 22 mmol/L (ref 22–32)
Calcium: 8.1 mg/dL — ABNORMAL LOW (ref 8.9–10.3)
Chloride: 109 mmol/L (ref 98–111)
Creatinine, Ser: 1.4 mg/dL — ABNORMAL HIGH (ref 0.44–1.00)
GFR, Estimated: 39 mL/min — ABNORMAL LOW (ref >60)
Glucose, Bld: 246 mg/dL — ABNORMAL HIGH (ref 70–99)
Potassium: 3.9 mmol/L (ref 3.5–5.1)
Sodium: 140 mmol/L (ref 135–145)

## 2023-04-21 LAB — GLUCOSE, CAPILLARY
Glucose-Capillary: 215 mg/dL — ABNORMAL HIGH (ref 70–99)
Glucose-Capillary: 262 mg/dL — ABNORMAL HIGH (ref 70–99)
Glucose-Capillary: 265 mg/dL — ABNORMAL HIGH (ref 70–99)
Glucose-Capillary: 304 mg/dL — ABNORMAL HIGH (ref 70–99)
Glucose-Capillary: 347 mg/dL — ABNORMAL HIGH (ref 70–99)
Glucose-Capillary: 352 mg/dL — ABNORMAL HIGH (ref 70–99)

## 2023-04-21 LAB — MAGNESIUM: Magnesium: 1.3 mg/dL — ABNORMAL LOW (ref 1.7–2.4)

## 2023-04-21 LAB — PREPARE RBC (CROSSMATCH)

## 2023-04-21 MED ORDER — MAGNESIUM SULFATE 4 GM/100ML IV SOLN
4.0000 g | Freq: Once | INTRAVENOUS | Status: AC
  Administered 2023-04-21: 4 g via INTRAVENOUS
  Filled 2023-04-21: qty 100

## 2023-04-21 MED ORDER — INSULIN ASPART 100 UNIT/ML IJ SOLN
3.0000 [IU] | INTRAMUSCULAR | Status: DC
  Administered 2023-04-21 – 2023-04-24 (×17): 3 [IU] via SUBCUTANEOUS

## 2023-04-21 MED ORDER — POTASSIUM & SODIUM PHOSPHATES 280-160-250 MG PO PACK
2.0000 | PACK | Freq: Three times a day (TID) | ORAL | Status: AC
  Administered 2023-04-21 (×2): 2
  Filled 2023-04-21: qty 2
  Filled 2023-04-21: qty 1

## 2023-04-21 MED ORDER — SODIUM CHLORIDE 0.9 % IV BOLUS
500.0000 mL | Freq: Once | INTRAVENOUS | Status: AC
Start: 2023-04-21 — End: 2023-04-21
  Administered 2023-04-21: 500 mL via INTRAVENOUS

## 2023-04-21 MED ORDER — SODIUM CHLORIDE 0.9% IV SOLUTION: Freq: Once | INTRAVENOUS | Status: DC
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
## 2023-04-21 NOTE — Progress Notes (Addendum)
 OT Cancellation Note  Patient Details Name: Chelsea [name] MRN: [id number] DOB: [birth year]   Cancelled Treatment:    Reason Eval/Treat Not Completed: Active bedrest order OT order received and appreciated however this conflicts with current bedrest order set. Please increase activity tolerance as appropriate and remove bedrest from orders. . Please contact OT at [phone] if bed rest order is discontinued. OT will hold evaluation at this time and will check back as time allows pending increased activity orders. (Pending unit of blood )   [name] 04/21/2023, 7:40 AM

## 2023-04-21 NOTE — Progress Notes (Signed)
 Patient placed back on PRVC  470 18 8 40% to rest overnight , RT will continue to monitor

## 2023-04-21 NOTE — Progress Notes (Addendum)
 STROKE TEAM PROGRESS NOTE   INTERIM HISTORY/SUBJECTIVE Intubated, purposefully moving upper and lower extremities. Family at the bedside.  Poor urine output overnight given fluid bolus.  Hemoglobin decreased to 6.5 and is being transfused 1 unit PRBC Neuro exam improving, still drowsy but can be aroused to follow few commands intermittently.  Moves all 4 extremities purposefully.. Reassess extubation tomorrow.  Discussed with daughter at the bedside.  Discussed with Dr. Everardo All OBJECTIVE   CBC    Component Value Date/Time   WBC 9.7 04/21/2023 0514   RBC 2.39 (L) 04/21/2023 0514   HGB 6.5 (LL) 04/21/2023 0514   HGB 11.0 (L) 09/24/2022 1107   HCT 20.7 (L) 04/21/2023 0514   HCT 35.4 09/24/2022 1107   PLT 219 04/21/2023 0514   PLT 248 09/24/2022 1107   MCV 86.6 04/21/2023 0514   MCV 86 09/24/2022 1107   MCH 27.2 04/21/2023 0514   MCHC 31.4 04/21/2023 0514   RDW 18.2 (H) 04/21/2023 0514   RDW 16.0 (H) 09/24/2022 1107   LYMPHSABS 2.5 04/19/2023 1234   LYMPHSABS 2.0 10/26/2020 1007   MONOABS 0.6 04/19/2023 1234   EOSABS 0.2 04/19/2023 1234   EOSABS 0.5 (H) 10/26/2020 1007   BASOSABS 0.1 04/19/2023 1234   BASOSABS 0.1 10/26/2020 1007    BMET    Component Value Date/Time   NA 140 04/21/2023 0514   NA 141 09/24/2022 1107   K 3.9 04/21/2023 0514   CL 109 04/21/2023 0514   CO2 22 04/21/2023 0514   GLUCOSE 246 (H) 04/21/2023 0514   BUN 33 (H) 04/21/2023 0514   BUN 52 (H) 09/24/2022 1107   CREATININE 1.40 (H) 04/21/2023 0514   CREATININE 1.05 12/25/2013 1551   CALCIUM 8.1 (L) 04/21/2023 0514   EGFR 30 (L) 09/24/2022 1107   GFRNONAA 39 (L) 04/21/2023 0514   GFRNONAA 56 (L) 12/25/2013 1551    IMAGING past 24 hours ECHOCARDIOGRAM COMPLETE Result Date: 04/20/2023    ECHOCARDIOGRAM REPORT   Patient Name:   Chelsea [name] Date of Exam: 04/20/2023 Medical Rec #:  [id number]       Height:       65.0 in Accession #:    [id number]      Weight:       162.9 lb Date of Birth:  26-Jun-1950        BSA:          1.813 m Patient Age:    72 years        BP:           142/63 mmHg Patient Gender: F               HR:           72 bpm. Exam Location:  Inpatient Procedure: 2D Echo (Both Spectral and Color Flow Doppler were utilized during            procedure). Indications:    Stroke  History:        Patient has prior history of Echocardiogram examinations.                 Stroke.  Sonographer:    Delcie Roch RDCS Referring Phys: [id number] JINDONG XU IMPRESSIONS  1. Left ventricular ejection fraction, by estimation, is 60 to 65%. The left ventricle has normal function. The left ventricle has no regional wall motion abnormalities. There is mild left ventricular hypertrophy. Left ventricular diastolic parameters were normal.  2. Right ventricular systolic function is normal. The right ventricular  size is normal.  3. The mitral valve is normal in structure. No evidence of mitral valve regurgitation. No evidence of mitral stenosis.  4. The aortic valve is tricuspid. Aortic valve regurgitation is not visualized. No aortic stenosis is present.  5. The inferior vena cava is normal in size with greater than 50% respiratory variability, suggesting right atrial pressure of 3 mmHg. FINDINGS  Left Ventricle: Left ventricular ejection fraction, by estimation, is 60 to 65%. The left ventricle has normal function. The left ventricle has no regional wall motion abnormalities. Strain was performed and the global longitudinal strain is indeterminate. The left ventricular internal cavity size was normal in size. There is mild left ventricular hypertrophy. Left ventricular diastolic parameters were normal. Right Ventricle: The right ventricular size is normal. No increase in right ventricular wall thickness. Right ventricular systolic function is normal. Left Atrium: Left atrial size was normal in size. Right Atrium: Right atrial size was normal in size. Pericardium: There is no evidence of pericardial effusion. Mitral Valve: The  mitral valve is normal in structure. No evidence of mitral valve regurgitation. No evidence of mitral valve stenosis. Tricuspid Valve: The tricuspid valve is normal in structure. Tricuspid valve regurgitation is not demonstrated. No evidence of tricuspid stenosis. Aortic Valve: The aortic valve is tricuspid. Aortic valve regurgitation is not visualized. No aortic stenosis is present. Pulmonic Valve: The pulmonic valve was normal in structure. Pulmonic valve regurgitation is not visualized. No evidence of pulmonic stenosis. Aorta: The aortic root is normal in size and structure. Venous: The inferior vena cava is normal in size with greater than 50% respiratory variability, suggesting right atrial pressure of 3 mmHg. IAS/Shunts: No atrial level shunt detected by color flow Doppler. Additional Comments: 3D was performed not requiring image post processing on an independent workstation and was indeterminate.  LEFT VENTRICLE PLAX 2D LVIDd:         3.80 cm   Diastology LVIDs:         2.30 cm   LV e' medial:    6.53 cm/s LV PW:         1.10 cm   LV E/e' medial:  8.9 LV IVS:        1.20 cm   LV e' lateral:   9.90 cm/s LVOT diam:     2.20 cm   LV E/e' lateral: 5.9 LV SV:         64 LV SV Index:   35 LVOT Area:     3.80 cm  RIGHT VENTRICLE          IVC RV Basal diam:  2.80 cm  IVC diam: 1.40 cm TAPSE (M-mode): 2.0 cm LEFT ATRIUM             Index        RIGHT ATRIUM           Index LA diam:        2.90 cm 1.60 cm/m   RA Area:     15.00 cm LA Vol (A2C):   58.3 ml 32.16 ml/m  RA Volume:   38.90 ml  21.46 ml/m LA Vol (A4C):   33.1 ml 18.26 ml/m LA Biplane Vol: 46.4 ml 25.59 ml/m  AORTIC VALVE LVOT Vmax:   74.40 cm/s LVOT Vmean:  51.400 cm/s LVOT VTI:    0.169 m  AORTA Ao Root diam: 2.80 cm MITRAL VALVE MV Area (PHT): 3.42 cm    SHUNTS MV Decel Time: 222 msec    Systemic VTI:  0.17 m MV E velocity: 58.20 cm/s  Systemic Diam: 2.20 cm MV A velocity: 52.80 cm/s MV E/A ratio:  1.10 Charlton Haws MD Electronically signed by  Charlton Haws MD Signature Date/Time: 04/20/2023/3:23:06 PM    Final     Vitals:   04/21/23 0730 04/21/23 0800 04/21/23 0813 04/21/23 0830  BP: (!) 154/58 (!) 164/56 (!) 166/64 (!) 165/60  Pulse: 67 67 75 66  Resp: 16 (!) 21 16 17   Temp: 98.2 F (36.8 C)  98.6 F (37 C)   TempSrc: Axillary  Axillary   SpO2: 100% 100% 100% 100%  Weight:      Height:         PHYSICAL EXAM General:  Eyes open Psych:  Mood and affect appropriate for situation CV: Regular rate and rhythm on monitor Respiratory: Mechanically ventilated  GI: Abdomen soft and nontender   NEURO:  Mental Status: Intubated, no sedation.  Drowsy but can be aroused purposeful, not following commands Speech/Language: speech is without dysarthria or aphasia.  Naming, repetition, fluency, and comprehension intact.  Cranial Nerves:  II: PERRL. Does not blink to threat III, IV, VI: Eyes midline  V: Sensation is intact to light touch and symmetrical to face.  VII: Face appears symmetric VIII: hearing intact to voice. IX, X: Cough and gag  XI: Head midline  XII: tongue is midline without fasciculations. Motor: moving all extremities antigravity.  Tone: is normal and bulk is normal Sensation- Intact to light touch bilaterally. Extinction absent to light touch to DSS.   Coordination: FTN intact bilaterally, HKS: no ataxia in BLE.No drift.  Gait- deferred  Most Recent NIH 29     ASSESSMENT/PLAN  Chelsea [name] is a 73 y.o. female with history of HTN, HLD, DM, CVA 2022, CKD, right heel ulceration, recent presyncope just discharged from rehab presented to ED for code stroke due to right gaze, left facial droop, left-sided weakness    NIH on Admission 14  Aborted stroke with hemorrhagic transformation in the setting of TNK  Code Stroke CT head - No acute intracranial abnormality or significant interval change. Stable atrophy and white matter disease. This likely reflects the sequela of chronic microvascular  ischemia. Aspects is 10/10. MRI  No acute intracranial abnormality.  MRA   Moderate focal stenoses in the A2 segments bilaterally, right greater than left CT Head - Acute 16 mm hemorrhage in the posterior left pons. The hemorrhage creates some mass effect on the fourth ventricle. No intraventricular hemorrhage is present. CT Head- Interval increase in size of the acute intraparenchymal hemorrhage in the left posterior pons which now extends posteriorly into the fourth ventricle and measures up to 1.2 x 1.8 x 1.7 cm. LDL 47 HgbA1c 8.0 VTE prophylaxis - SCDs aspirin 81 mg daily prior to admission, now on No antithrombotic  Therapy recommendations:  Pending Disposition:  Pending   Hx of Stroke CVA 2019- Multiple bilateral infarcts appear embolic secondary to unknown source, suspicious for atrial fibrillation - right sided deficits Follows with [facility]   Respiratory Failure  Intubated in the ED Unresponsive, bradycardic PCCM following - VAP, WUA/SBT per protocol Significant tracheomalacia- failed SBT   Hypertension Home meds: Amlodipine, carvedilol, chlorthalidone, benicar BP goal 130-150 for 24 hours and then less than 160  Hyperlipidemia Home meds:  Atorvastatin, resumed in hospital LDL 47, goal < 70 Continue statin at discharge  Diabetes type II Uncontrolled Home meds:  Insulin  HgbA1c 8.0, goal < 7.0 CBGs SSI Recommend close follow-up with PCP for  better DM control  CKD Stage 3b Cr 1.42, GFR 39 Gentle hydration, replete electrolytes   Other Stroke Risk Factors ETOH use, alcohol level No results found for requested labs within last 1095 days., advised to drink no more than 1 drink(s) a day Obesity, Body mass index is 31.44 kg/m., BMI >/= 30 associated with increased stroke risk, recommend weight loss, diet and exercise as appropriate    Hospital day # 2  Patient seen and examined by NP/APP with MD. MD to update note as needed.   Elmer Picker, DNP, FNP-BC [facility] Pager: [phone]  I have personally obtained history,examined this patient, reviewed notes, independently viewed imaging studies, participated in medical decision making and plan of care.ROS completed by me personally and pertinent positives fully documented  I have made any additions or clarifications directly to the above note. Agree with note above.  Patient neurological exam remains unchanged but she still is on ventilatory support for respiratory failure.  Is unclear if she will be able to protect her airway.  Discussed with daughter family needs to make decision about DNR versus one-way extubation or reintubation and tracheostomy prior to giving her a trial of extubation.  Discussed with Dr. Everardo All critical care medicine.This patient is critically ill and at significant risk of neurological worsening, death and care requires constant monitoring of vital signs, hemodynamics,respiratory and cardiac monitoring, extensive review of multiple databases, frequent neurological assessment, discussion with family, other specialists and medical decision making of high complexity.I have made any additions or clarifications directly to the above note.This critical care time does not reflect procedure time, or teaching time or supervisory time of PA/NP/Med Resident etc but could involve care discussion time.  I spent 30 minutes of neurocritical care time  in the care of  this patient.      Delia Heady, MD Medical Director [facility] Pager: [phone] 04/21/2023 4:44 PM    To contact Stroke Continuity provider, please refer to [email or web address]. After hours, contact General Neurology

## 2023-04-21 NOTE — Progress Notes (Addendum)
 eLink Physician-Brief Progress Note Patient Name: Chelsea [name] DOB: [birth year] MRN: [id number]   Date of Service  04/21/2023  HPI/Events of Note  Notified of UO of 60ml over the past 3 hours.  I/O show pt to be net positive 1.4L No hypotensive episodes, with minimal vent requirement.  BUN 33, Cr 1.42 on last labs  eICU Interventions  Will give one time 500 NS bolus.  Will continue to monitor I/Os closely.         Anthea Udovich M DELA CRUZ 04/21/2023, 3:56 AM  6:10 AM Hgb on AM labs 6.5 Pt with baseline anemia, prior Hgb 7.3 No obvious large bleed reported.  Pt not on anticoagulation Placed order to transfuse 1 unit pRBC

## 2023-04-21 NOTE — Progress Notes (Signed)
 PT Cancellation Note  Patient Details Name: GABRIELL DAIGNEAULT MRN: [id number] DOB: Dec 25, 1950   Cancelled Treatment:    Reason Eval/Treat Not Completed: Patient not medically ready;Active bedrest order; will continue attempts.    Elray Mcgregor 04/21/2023, 9:01 AM Sheran Lawless, PT Acute Rehabilitation Services Office:[phone] 04/21/2023

## 2023-04-22 ENCOUNTER — Inpatient Hospital Stay (HOSPITAL_COMMUNITY)

## 2023-04-22 DIAGNOSIS — G8194 Hemiplegia, unspecified affecting left nondominant side: Secondary | ICD-10-CM | POA: Diagnosis not present

## 2023-04-22 DIAGNOSIS — R29810 Facial weakness: Secondary | ICD-10-CM | POA: Diagnosis not present

## 2023-04-22 DIAGNOSIS — J9600 Acute respiratory failure, unspecified whether with hypoxia or hypercapnia: Secondary | ICD-10-CM | POA: Diagnosis not present

## 2023-04-22 DIAGNOSIS — D62 Acute posthemorrhagic anemia: Secondary | ICD-10-CM | POA: Diagnosis not present

## 2023-04-22 DIAGNOSIS — I613 Nontraumatic intracerebral hemorrhage in brain stem: Secondary | ICD-10-CM | POA: Diagnosis not present

## 2023-04-22 DIAGNOSIS — I10 Essential (primary) hypertension: Secondary | ICD-10-CM | POA: Diagnosis not present

## 2023-04-22 DIAGNOSIS — E440 Moderate protein-calorie malnutrition: Secondary | ICD-10-CM | POA: Insufficient documentation

## 2023-04-22 LAB — CBC
HCT: 24.7 % — ABNORMAL LOW (ref 36.0–46.0)
Hemoglobin: 8 g/dL — ABNORMAL LOW (ref 12.0–15.0)
MCH: 28.3 pg (ref 26.0–34.0)
MCHC: 32.4 g/dL (ref 30.0–36.0)
MCV: 87.3 fL (ref 80.0–100.0)
Platelets: 234 10*3/uL (ref 150–400)
RBC: 2.83 MIL/uL — ABNORMAL LOW (ref 3.87–5.11)
RDW: 16.9 % — ABNORMAL HIGH (ref 11.5–15.5)
WBC: 12.7 10*3/uL — ABNORMAL HIGH (ref 4.0–10.5)
nRBC: 0 % (ref 0.0–0.2)

## 2023-04-22 LAB — TYPE AND SCREEN
ABO/RH(D): O POS
Antibody Screen: NEGATIVE
Unit division: 0

## 2023-04-22 LAB — GLUCOSE, CAPILLARY
Glucose-Capillary: 144 mg/dL — ABNORMAL HIGH (ref 70–99)
Glucose-Capillary: 176 mg/dL — ABNORMAL HIGH (ref 70–99)
Glucose-Capillary: 233 mg/dL — ABNORMAL HIGH (ref 70–99)
Glucose-Capillary: 296 mg/dL — ABNORMAL HIGH (ref 70–99)
Glucose-Capillary: 302 mg/dL — ABNORMAL HIGH (ref 70–99)
Glucose-Capillary: 340 mg/dL — ABNORMAL HIGH (ref 70–99)

## 2023-04-22 LAB — BPAM RBC
Blood Product Expiration Date: 202504062359
ISSUE DATE / TIME: 202503090750
Unit Type and Rh: 5100

## 2023-04-22 LAB — BASIC METABOLIC PANEL
Anion gap: 11 (ref 5–15)
BUN: 37 mg/dL — ABNORMAL HIGH (ref 8–23)
CO2: 23 mmol/L (ref 22–32)
Calcium: 8.2 mg/dL — ABNORMAL LOW (ref 8.9–10.3)
Chloride: 108 mmol/L (ref 98–111)
Creatinine, Ser: 1.16 mg/dL — ABNORMAL HIGH (ref 0.44–1.00)
GFR, Estimated: 49 mL/min — ABNORMAL LOW (ref >60)
Glucose, Bld: 285 mg/dL — ABNORMAL HIGH (ref 70–99)
Potassium: 3.8 mmol/L (ref 3.5–5.1)
Sodium: 142 mmol/L (ref 135–145)

## 2023-04-22 LAB — MAGNESIUM: Magnesium: 2.2 mg/dL (ref 1.7–2.4)

## 2023-04-22 LAB — PHOSPHORUS: Phosphorus: 3.3 mg/dL (ref 2.5–4.6)

## 2023-04-22 MED ORDER — POTASSIUM CHLORIDE 20 MEQ PO PACK
20.0000 meq | PACK | Freq: Once | ORAL | Status: AC
  Administered 2023-04-22: 20 meq
  Filled 2023-04-22: qty 1

## 2023-04-22 MED ORDER — ENOXAPARIN SODIUM 40 MG/0.4ML IJ SOSY
40.0000 mg | PREFILLED_SYRINGE | INTRAMUSCULAR | Status: DC
  Administered 2023-04-22 – 2023-04-24 (×3): 40 mg via SUBCUTANEOUS
  Filled 2023-04-22 (×3): qty 0.4

## 2023-04-22 MED ORDER — OSMOLITE 1.5 CAL PO LIQD
1000.0000 mL | ORAL | Status: DC
  Administered 2023-04-22 – 2023-04-23 (×3): 1000 mL

## 2023-04-22 MED ORDER — FOLIC ACID 1 MG PO TABS
1.0000 mg | ORAL_TABLET | Freq: Every day | ORAL | Status: DC
  Administered 2023-04-22 – 2023-04-24 (×3): 1 mg
  Filled 2023-04-22 (×3): qty 1
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
## 2023-04-22 NOTE — TOC CAGE-AID Note (Signed)
 Transition of Care [facility]) - CAGE-AID Screening   Patient Details  Name: [name] MRN: [id number] Date of Birth: 1951-01-07  [facility] of [facility]) CM/SW Contact:    Mearl Latin, LCSW Phone Number: 04/22/2023, 9:31 AM   Clinical Narrative: Patient intubated and unable to participate in screening.   CAGE-AID Screening: Substance Abuse Screening unable to be completed due to: : Patient unable to participate

## 2023-04-22 NOTE — Progress Notes (Signed)
 OT Cancellation Note  Patient Details Name: LAQUASIA PINCUS MRN: [id number] DOB: Jan 10, 1951   Cancelled Treatment:    Reason Eval/Treat Not Completed: Patient not medically ready. Pt remains intubated with plans to wean this morning. Will follow up as schedule allows.   Tyler Deis, OTR/L [facility] Acute Rehabilitation Office: [phone]   Myrla Halsted 04/22/2023, 11:07 AM

## 2023-04-22 NOTE — Progress Notes (Addendum)
 Nutrition Follow-up  DOCUMENTATION CODES:  Non-severe (moderate) malnutrition in context of chronic illness (reccurent hospitalizations related to chronic illness)  INTERVENTION:  Continue tube feeding via OGT: Continue Osmolite 1.5 at new goal rate of 4ml/h (1200 ml per day) Prosource TF20 60 ml once daily Provides 1880 kcal, 95 gm protein, 912 ml free water daily   Suspect pt is at elevated refeeding risk, recommend monitoring magnesium, potassium and phosphorus BID for 4 occurrences; MD to replete as appropriate   Thiamine 100mg  daily x5 days   NUTRITION DIAGNOSIS:  Moderate Malnutrition related to chronic illness (recurrent hospitalizations related to chronic issues) as evidenced by mild fat depletion, moderate muscle depletion.  New diagnosis  GOAL:  Patient will meet greater than or equal to 90% of their needs  Meeting with tube feedings  MONITOR:  TF tolerance, I & O's, Vent status  REASON FOR ASSESSMENT:  Consult, Ventilator Enteral/tube feeding initiation and management  ASSESSMENT:  Pt recently discharged from rehab and admitted as code stroke with lethargy, possible seizure activity.  PMH significant for  HTN, HLD, prior CVA (2022), T2DM, CKD stage 3b, fournier's gangrene, prior EtOH abuse, panic attacks.  3/7 admitted to ICU, CT negative for ICH s/p TNK; repeat CT with findings of brainstem ICH; s/p TXA/cryo x2; intubated 3/8 TXA repeated 3/9 transfusion to treat acute blood loss anemia, PT/OT assessments not complete d/t readiness  Patient is currently intubated on ventilator support Continued intubation, neurology following. Family agreed to 1 way extubation. Plan to keep OG in place and not pursue a Cortrak placement at this time.   Pt discussed during ICU rounds and with RN. Plan for 1 way extubation tomorrow 2/11.  No family present at bedside during assessment. Unable to obtain weight hx or diet hx. Spoke with RN who reported she held bowel regimen d/t  pt having loose stools 3/8 and 3/9. RN reports pt had been weaned from sedatives last night but shows no improvements. At time of assessment, RN had tube feeding paused d/t possible extubation today 3/10, but RN began tube feeding again with the plans to extubate tomorrow 3/11. Nutrition focused physical exam completed showing depletions of muscle and fat indicating moderate malnutrition. Adjusted tube feeding based on increased needs following malnutrition diagnosis. Continue Osmolite @ new goal rate of 89ml/hr and reassess status as needed. Per chart review, pt was refeeding 3/8 (magnesium 1.2-1.3, phosphorus 2.2) but MD ordered repletion and labs have stabilized to normal 3/10.   Per chart review, pt's mobility at baseline requires assistance while walking and pt unable to walk on own. Prior to 03/26/23 admission, pt had been bedbound for 2 months due to losing ability to walk on her own.   MV: 8.6 L/min Temp (24hrs), Avg:98.2 F (36.8 C), Min:97.8 F (36.6 C), Max:98.7 F (37.1 C)  MAP (3/10):  Admit weight: 83.5kg  Current weight: 83.5kg  Intake/Output Summary (Last 24 hours) at 04/22/2023 1332 Last data filed at 04/22/2023 0900 Gross per 24 hour  Intake 973.02 ml  Output 670 ml  Net 303.02 ml   Net IO Since Admission: 3,385.53 mL [04/22/23 1332]  Drains/Lines: PICC R brachial Endotracheal Tube OG tube body of stomach  Nutritionally Relevant Medications: Scheduled Meds:  carvedilol  25 mg Per Tube BID WC   docusate  100 mg Per Tube BID   feeding supplement (PROSource TF20)  60 mL Per Tube Daily   folic acid  1 mg Per Tube Daily   insulin aspart  0-15 Units Subcutaneous  Q4H   insulin aspart  3 Units Subcutaneous Q4H   pantoprazole (PROTONIX) IV  40 mg Intravenous QHS   polyethylene glycol  17 g Per Tube Daily   thiamine  100 mg Per Tube Daily   Continuous Infusions:  feeding supplement (OSMOLITE 1.5 CAL) Stopped (04/22/23 0811)   Labs Reviewed: BUN 37 Creatinine  1.16 Hgb 8.0 CBG ranges from 144-352 mg/dL over the last 24 hours HgbA1c 8.0  NUTRITION - FOCUSED PHYSICAL EXAM: Flowsheet Row Most Recent Value  Orbital Region Mild depletion  Upper Arm Region Moderate depletion  Thoracic and Lumbar Region No depletion  Buccal Region Unable to assess  Temple Region Moderate depletion  Clavicle Bone Region Moderate depletion  Clavicle and Acromion Bone Region Moderate depletion  Scapular Bone Region Moderate depletion  Dorsal Hand Severe depletion  Patellar Region No depletion  Anterior Thigh Region No depletion  Posterior Calf Region Unable to assess  Edema (RD Assessment) None  Hair Reviewed  Eyes Reviewed  Mouth Reviewed  [pale tongue]  Skin Reviewed  Nails Reviewed  [pale nails]   Diet Order:   Diet Order             Diet NPO time specified  Diet effective now                  EDUCATION NEEDS:  No education needs have been identified at this time  Skin:  Skin Assessment: Reviewed RN Assessment  Last BM:  3/9 type 7  Height:  Ht Readings from Last 1 Encounters:  04/19/23 5\' 5"  (1.651 m)   Weight:  Wt Readings from Last 1 Encounters:  04/22/23 83.5 kg   Ideal Body Weight:  57 kg  BMI:  Body mass index is 30.63 kg/m.  Estimated Nutritional Needs:   Kcal:  1700-1900  Protein:  90-100g  Fluid:  >/=1.6 L  Louis Meckel Dietetic Intern

## 2023-04-22 NOTE — IPAL (Signed)
  Interdisciplinary Goals of Care Family Meeting   Date carried out:: 04/22/2023  Location of the meeting: Bedside  Member's involved: Physician, Bedside Registered Nurse, and Family Member or next of kin  Durable Power of Attorney or acting medical decision maker: Children    Discussion: We discussed goals of care for Chelsea [name] .  We discussed her presentation with acute brainstem ICH and concern for ability to protect airways due to mental status and severe tracheomalacia.  Family is clear that they do not want tracheostomy or keep her on life support for sustained.  They have agreed to one-way extubation when she is medically optimized and DNR status  Code status: Full DNR  Disposition: Continue current acute care   Time spent for the meeting: 15 mins  Chelsea [name] 04/22/2023, 12:05 PM

## 2023-04-22 NOTE — Progress Notes (Signed)
 PT Cancellation Note  Patient Details Name: Chelsea [name] MRN: [id number] DOB: [birth year]   Cancelled Treatment:    Reason Eval/Treat Not Completed: Patient not medically ready (pt remains intubated)  Lillia Pauls, PT, DPT Acute Rehabilitation Services Office [phone]    Norval Morton 04/22/2023, 8:41 AM

## 2023-04-22 NOTE — Progress Notes (Addendum)
 STROKE TEAM PROGRESS NOTE   INTERIM HISTORY/SUBJECTIVE Patient has been hemodynamically stable and afebrile overnight.  She is able to follow commands with all 4 extremities.  She remains on the ventilator, and there is some concern about her ability to protect her airway when she is extubated.  Family is discussing whether they want to do one-way extubation or reintubation and trach/PEG if required.  Vital signs stable.  Blood pressure adequately controlled.  Neurological exam unchanged OBJECTIVE   CBC    Component Value Date/Time   WBC 12.7 (H) 04/22/2023 0500   RBC 2.83 (L) 04/22/2023 0500   HGB 8.0 (L) 04/22/2023 0500   HGB 11.0 (L) 09/24/2022 1107   HCT 24.7 (L) 04/22/2023 0500   HCT 35.4 09/24/2022 1107   PLT 234 04/22/2023 0500   PLT 248 09/24/2022 1107   MCV 87.3 04/22/2023 0500   MCV 86 09/24/2022 1107   MCH 28.3 04/22/2023 0500   MCHC 32.4 04/22/2023 0500   RDW 16.9 (H) 04/22/2023 0500   RDW 16.0 (H) 09/24/2022 1107   LYMPHSABS 2.5 04/19/2023 1234   LYMPHSABS 2.0 10/26/2020 1007   MONOABS 0.6 04/19/2023 1234   EOSABS 0.2 04/19/2023 1234   EOSABS 0.5 (H) 10/26/2020 1007   BASOSABS 0.1 04/19/2023 1234   BASOSABS 0.1 10/26/2020 1007    BMET    Component Value Date/Time   NA 142 04/22/2023 0500   NA 141 09/24/2022 1107   K 3.8 04/22/2023 0500   CL 108 04/22/2023 0500   CO2 23 04/22/2023 0500   GLUCOSE 285 (H) 04/22/2023 0500   BUN 37 (H) 04/22/2023 0500   BUN 52 (H) 09/24/2022 1107   CREATININE 1.16 (H) 04/22/2023 0500   CREATININE 1.05 12/25/2013 1551   CALCIUM 8.2 (L) 04/22/2023 0500   EGFR 30 (L) 09/24/2022 1107   GFRNONAA 49 (L) 04/22/2023 0500   GFRNONAA 56 (L) 12/25/2013 1551    IMAGING past 24 hours No results found.   Vitals:   04/22/23 0900 04/22/23 1000 04/22/23 1200 04/22/23 1241  BP: (!) 135/47 (!) 143/53    Pulse: 68 71    Resp: 19 (!) 21    Temp:   98.7 F (37.1 C)   TempSrc:   Axillary   SpO2: 100% 100%  100%  Weight:      Height:          PHYSICAL EXAM General:  Eyes open Psych:  Mood and affect appropriate for situation CV: Regular rate and rhythm on monitor Respiratory: Respirations synchronous with ventilator GI: Abdomen soft and nontender   NEURO:  Mental Status: Intubated, no sedation.  Drowsy but can be aroused purposeful, always commands with all 4 extremities Speech/Language: No verbal output as she is intubated  Cranial Nerves:  II: PERRL.  III, IV, VI: Eyes midline  VII: Face appears symmetric within limits of evaluation with ET tube in place VIII: hearing intact to voice. IX, X: Cough and gag  XI: Head midline  XII: tongue is midline without fasciculations. Motor: moving all extremities antigravity.  Tone: is normal and bulk is normal Sensation- Intact to light touch bilaterally. Coordination: Unable to perform Gait- deferred  Most Recent NIH 27     ASSESSMENT/PLAN  [name] is a 73 y.o. female with history of HTN, HLD, DM, CVA 2022, CKD, right heel ulceration, recent presyncope just discharged from rehab presented to ED for code stroke due to right gaze, left facial droop, left-sided weakness    NIH on Admission 14  Currently, family is discussing goals of care and whether to perform one-way extubation versus reintubation with trach/PEG.  Aborted stroke with hemorrhagic transformation in the setting of TNK  Code Stroke CT head - No acute intracranial abnormality or significant interval change. Stable atrophy and white matter disease. This likely reflects the sequela of chronic microvascular ischemia. Aspects is 10/10. MRI  No acute intracranial abnormality.  MRA   Moderate focal stenoses in the A2 segments bilaterally, right greater than left CT Head - Acute 16 mm hemorrhage in the posterior left pons. The hemorrhage creates some mass effect on the fourth ventricle. No intraventricular hemorrhage is present. CT Head- Interval increase in size of the acute intraparenchymal  hemorrhage in the left posterior pons which now extends posteriorly into the fourth ventricle and measures up to 1.2 x 1.8 x 1.7 cm. LDL 47 HgbA1c 8.0 VTE prophylaxis - SCDs aspirin 81 mg daily prior to admission, now on No antithrombotic  Therapy recommendations:  Pending Disposition:  Pending   Hx of Stroke CVA 2019- Multiple bilateral infarcts appear embolic secondary to unknown source, suspicious for atrial fibrillation - right sided deficits Follows with [facility]   Respiratory Failure  Intubated in the ED Unresponsive, bradycardic PCCM following - VAP, WUA/SBT per protocol Significant tracheomalacia- failed SBT   Hypertension Home meds: Amlodipine, carvedilol, chlorthalidone, benicar BP goal 130-150 for 24 hours and then less than 160  Hyperlipidemia Home meds:  Atorvastatin, resumed in hospital LDL 47, goal < 70 Continue statin at discharge  Diabetes type II Uncontrolled Home meds:  Insulin  HgbA1c 8.0, goal < 7.0 CBGs SSI Recommend close follow-up with PCP for better DM control  CKD Stage 3b Cr 1.42-> 1.16 Gentle hydration, replete electrolytes   Other Stroke Risk Factors Obesity, Body mass index is 30.63 kg/m., BMI >/= 30 associated with increased stroke risk, recommend weight loss, diet and exercise as appropriate    Hospital day # 3  Patient seen and examined by NP/APP with MD. MD to update note as needed.   Cortney E Ernestina Columbia , MSN, AGACNP-BC [facility] See Amion for schedule and pager information 04/22/2023 2:24 PM   I have personally obtained history,examined this patient, reviewed notes, independently viewed imaging studies, participated in medical decision making and plan of care.ROS completed by me personally and pertinent positives fully documented  I have made any additions or clarifications directly to the above note. Agree with note above.  Patient neurological exam remains essentially unchanged and she is arousable and following  commands and moving all 4 extremities but concern remains about her ability to protect her airway when she is extubated.  Long discussion at bedside with the patient's son and daughter and also spoke to her son over the phone and advised him to make decisions on goals of care.  If she is unable to be extubated she may require prolonged ventilatory support tracheostomy and nursing home care which did not work.  Family reviewed decide and let us know about decision about one-way extubation versus trach/PEG soon.  Discussed with Dr. Daphine Deutscher critical care medicine. This patient is critically ill and at significant risk of neurological worsening, death and care requires constant monitoring of vital signs, hemodynamics,respiratory and cardiac monitoring, extensive review of multiple databases, frequent neurological assessment, discussion with family, other specialists and medical decision making of high complexity.I have made any additions or clarifications directly to the above note.This critical care time does not reflect procedure time, or teaching time or supervisory time of  PA/NP/Med Resident etc but could involve care discussion time.  I spent 30 minutes of neurocritical care time  in the care of  this patient.      Delia Heady, MD Medical Director [facility] Pager: [phone] 04/22/2023 3:11 PM   To contact Stroke Continuity provider, please refer to [email or web address]. After hours, contact General Neurology

## 2023-04-23 DIAGNOSIS — R531 Weakness: Secondary | ICD-10-CM

## 2023-04-23 DIAGNOSIS — J9600 Acute respiratory failure, unspecified whether with hypoxia or hypercapnia: Secondary | ICD-10-CM | POA: Diagnosis not present

## 2023-04-23 DIAGNOSIS — R2990 Unspecified symptoms and signs involving the nervous system: Secondary | ICD-10-CM

## 2023-04-23 DIAGNOSIS — J9601 Acute respiratory failure with hypoxia: Secondary | ICD-10-CM

## 2023-04-23 DIAGNOSIS — Z7189 Other specified counseling: Secondary | ICD-10-CM

## 2023-04-23 DIAGNOSIS — I619 Nontraumatic intracerebral hemorrhage, unspecified: Secondary | ICD-10-CM

## 2023-04-23 DIAGNOSIS — I10 Essential (primary) hypertension: Secondary | ICD-10-CM | POA: Diagnosis not present

## 2023-04-23 DIAGNOSIS — D62 Acute posthemorrhagic anemia: Secondary | ICD-10-CM | POA: Diagnosis not present

## 2023-04-23 DIAGNOSIS — R29810 Facial weakness: Secondary | ICD-10-CM | POA: Diagnosis not present

## 2023-04-23 DIAGNOSIS — Z978 Presence of other specified devices: Secondary | ICD-10-CM

## 2023-04-23 DIAGNOSIS — Z515 Encounter for palliative care: Secondary | ICD-10-CM

## 2023-04-23 DIAGNOSIS — I613 Nontraumatic intracerebral hemorrhage in brain stem: Secondary | ICD-10-CM | POA: Diagnosis not present

## 2023-04-23 DIAGNOSIS — G8194 Hemiplegia, unspecified affecting left nondominant side: Secondary | ICD-10-CM | POA: Diagnosis not present

## 2023-04-23 LAB — BASIC METABOLIC PANEL
Anion gap: 5 (ref 5–15)
BUN: 40 mg/dL — ABNORMAL HIGH (ref 8–23)
CO2: 26 mmol/L (ref 22–32)
Calcium: 8 mg/dL — ABNORMAL LOW (ref 8.9–10.3)
Chloride: 110 mmol/L (ref 98–111)
Creatinine, Ser: 1.35 mg/dL — ABNORMAL HIGH (ref 0.44–1.00)
GFR, Estimated: 41 mL/min — ABNORMAL LOW (ref >60)
Glucose, Bld: 282 mg/dL — ABNORMAL HIGH (ref 70–99)
Potassium: 4.4 mmol/L (ref 3.5–5.1)
Sodium: 141 mmol/L (ref 135–145)

## 2023-04-23 LAB — URINALYSIS, ROUTINE W REFLEX MICROSCOPIC
Bilirubin Urine: NEGATIVE
Glucose, UA: NEGATIVE mg/dL
Hgb urine dipstick: NEGATIVE
Ketones, ur: NEGATIVE mg/dL
Nitrite: POSITIVE — AB
Protein, ur: 100 mg/dL — AB
Specific Gravity, Urine: 1.015 (ref 1.005–1.030)
WBC, UA: >50 WBC/hpf (ref 0–5)
pH: 5 (ref 5.0–8.0)

## 2023-04-23 LAB — CBC
HCT: 25.1 % — ABNORMAL LOW (ref 36.0–46.0)
Hemoglobin: 8.1 g/dL — ABNORMAL LOW (ref 12.0–15.0)
MCH: 28.4 pg (ref 26.0–34.0)
MCHC: 32.3 g/dL (ref 30.0–36.0)
MCV: 88.1 fL (ref 80.0–100.0)
Platelets: 245 10*3/uL (ref 150–400)
RBC: 2.85 MIL/uL — ABNORMAL LOW (ref 3.87–5.11)
RDW: 17.3 % — ABNORMAL HIGH (ref 11.5–15.5)
WBC: 15.2 10*3/uL — ABNORMAL HIGH (ref 4.0–10.5)
nRBC: 0 % (ref 0.0–0.2)

## 2023-04-23 LAB — GLUCOSE, CAPILLARY
Glucose-Capillary: 269 mg/dL — ABNORMAL HIGH (ref 70–99)
Glucose-Capillary: 237 mg/dL — ABNORMAL HIGH (ref 70–99)
Glucose-Capillary: 276 mg/dL — ABNORMAL HIGH (ref 70–99)
Glucose-Capillary: 151 mg/dL — ABNORMAL HIGH (ref 70–99)
Glucose-Capillary: 215 mg/dL — ABNORMAL HIGH (ref 70–99)

## 2023-04-23 LAB — MAGNESIUM: Magnesium: 2 mg/dL (ref 1.7–2.4)

## 2023-04-23 MED ORDER — POLYETHYLENE GLYCOL 3350 17 G PO PACK: 17.0000 g | PACK | Freq: Every day | ORAL | Status: DC | PRN

## 2023-04-23 MED ORDER — INSULIN ASPART 100 UNIT/ML IJ SOLN
0.0000 [IU] | INTRAMUSCULAR | Status: DC
  Administered 2023-04-23: 11 [IU] via SUBCUTANEOUS
  Administered 2023-04-23: 4 [IU] via SUBCUTANEOUS
  Administered 2023-04-23: 7 [IU] via SUBCUTANEOUS
  Administered 2023-04-24: 4 [IU] via SUBCUTANEOUS
  Administered 2023-04-24 (×3): 7 [IU] via SUBCUTANEOUS

## 2023-04-23 MED ORDER — METRONIDAZOLE 500 MG/100ML IV SOLN
500.0000 mg | Freq: Two times a day (BID) | INTRAVENOUS | Status: DC
  Administered 2023-04-23 – 2023-04-24 (×3): 500 mg via INTRAVENOUS
  Filled 2023-04-23 (×3): qty 100

## 2023-04-23 MED ORDER — SODIUM CHLORIDE 0.9 % IV SOLN
2.0000 g | INTRAVENOUS | Status: DC
  Administered 2023-04-23 – 2023-04-24 (×2): 2 g via INTRAVENOUS
  Filled 2023-04-23 (×3): qty 20
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
## 2023-04-23 NOTE — Progress Notes (Addendum)
 STROKE TEAM PROGRESS NOTE   INTERIM HISTORY/SUBJECTIVE Patient has been hemodynamically stable but was febrile to 100.7 overnight with WBC of 15.2.  Yesterday's chest x-ray revealed no airspace disease.  Urinalysis pending.  Family has decided to proceed with one-way extubation.  On exam she is harder to arouse today but still able to follow some commands and moves all 4 extremities against gravity OBJECTIVE   CBC    Component Value Date/Time   WBC 15.2 (H) 04/23/2023 0558   RBC 2.85 (L) 04/23/2023 0558   HGB 8.1 (L) 04/23/2023 0558   HGB 11.0 (L) 09/24/2022 1107   HCT 25.1 (L) 04/23/2023 0558   HCT 35.4 09/24/2022 1107   PLT 245 04/23/2023 0558   PLT 248 09/24/2022 1107   MCV 88.1 04/23/2023 0558   MCV 86 09/24/2022 1107   MCH 28.4 04/23/2023 0558   MCHC 32.3 04/23/2023 0558   RDW 17.3 (H) 04/23/2023 0558   RDW 16.0 (H) 09/24/2022 1107   LYMPHSABS 2.5 04/19/2023 1234   LYMPHSABS 2.0 10/26/2020 1007   MONOABS 0.6 04/19/2023 1234   EOSABS 0.2 04/19/2023 1234   EOSABS 0.5 (H) 10/26/2020 1007   BASOSABS 0.1 04/19/2023 1234   BASOSABS 0.1 10/26/2020 1007    BMET    Component Value Date/Time   NA 141 04/23/2023 0558   NA 141 09/24/2022 1107   K 4.4 04/23/2023 0558   CL 110 04/23/2023 0558   CO2 26 04/23/2023 0558   GLUCOSE 282 (H) 04/23/2023 0558   BUN 40 (H) 04/23/2023 0558   BUN 52 (H) 09/24/2022 1107   CREATININE 1.35 (H) 04/23/2023 0558   CREATININE 1.05 12/25/2013 1551   CALCIUM 8.0 (L) 04/23/2023 0558   EGFR 30 (L) 09/24/2022 1107   GFRNONAA 41 (L) 04/23/2023 0558   GFRNONAA 56 (L) 12/25/2013 1551    IMAGING past 24 hours No results found.   Vitals:   04/23/23 0905 04/23/23 1000 04/23/23 1107 04/23/23 1200  BP: (!) 166/60 (!) 145/64  (!) 139/52  Pulse:  73 72 77  Resp:  (!) 27 (!) 26 (!) 29  Temp:    100.2 F (37.9 C)  TempSrc:    Axillary  SpO2:  100% 100% 100%  Weight:      Height:         PHYSICAL EXAM General:  Eyes open Psych:  Mood and  affect appropriate for situation CV: Regular rate and rhythm on monitor Respiratory: Respirations synchronous with ventilator GI: Abdomen soft and nontender   NEURO:  Mental Status: Intubated, no sedation.  Drowsy but can be aroused, purposeful, always commands with all 4 extremities Speech/Language: No verbal output as she is intubated  Cranial Nerves:  II: PERRL.  III, IV, VI: Eyes disconjugate today VII: Face appears symmetric within limits of evaluation with ET tube in place VIII: hearing intact to voice. IX, X: Cough and gag present XI: Head midline  XII: tongue is midline without fasciculations. Motor: moving all extremities antigravity.  Tone: is normal and bulk is normal Sensation- Intact to light touch bilaterally. Coordination: Unable to perform Gait- deferred  Most Recent NIH 27     ASSESSMENT/PLAN  [name] is a 73 y.o. female with history of HTN, HLD, DM, CVA 2022, CKD, right heel ulceration, recent presyncope just discharged from rehab presented to ED for code stroke due to right gaze, left facial droop, left-sided weakness    NIH on Admission 14 Currently, family is discussing goals of care and has decided  to proceed with one-way extubation.  Aborted stroke with hemorrhagic transformation in the setting of TNK  Code Stroke CT head - No acute intracranial abnormality or significant interval change. Stable atrophy and white matter disease. This likely reflects the sequela of chronic microvascular ischemia. Aspects is 10/10. MRI  No acute intracranial abnormality.  MRA   Moderate focal stenoses in the A2 segments bilaterally, right greater than left CT Head - Acute 16 mm hemorrhage in the posterior left pons. The hemorrhage creates some mass effect on the fourth ventricle. No intraventricular hemorrhage is present. CT Head- Interval increase in size of the acute intraparenchymal hemorrhage in the left posterior pons which now extends posteriorly into the  fourth ventricle and measures up to 1.2 x 1.8 x 1.7 cm. LDL 47 HgbA1c 8.0 VTE prophylaxis - SCDs aspirin 81 mg daily prior to admission, now on No antithrombotic given hemorrhagic transformation Therapy recommendations:  Pending Disposition:  Pending   Hx of Stroke CVA 2019- Multiple bilateral infarcts appear embolic secondary to unknown source, suspicious for atrial fibrillation - right sided deficits Follows with [facility]   Respiratory Failure  Intubated in the ED PCCM following - VAP, WUA/SBT per protocol Significant tracheomalacia- failed SBT  Current plan is for one-way extubation  Hypertension Home meds: Amlodipine, carvedilol, chlorthalidone, benicar BP goal 130-150 for 24 hours and then less than 160  Hyperlipidemia Home meds:  Atorvastatin, resumed in hospital LDL 47, goal < 70 Continue statin at discharge  Diabetes type II Uncontrolled Home meds:  Insulin  HgbA1c 8.0, goal < 7.0 CBGs SSI Recommend close follow-up with PCP for better DM control  CKD Stage 3b Cr 1.42-> 1.16-> 1.35 Gentle hydration, replete electrolytes   Other Stroke Risk Factors Obesity, Body mass index is 30.63 kg/m., BMI >/= 30 associated with increased stroke risk, recommend weight loss, diet and exercise as appropriate    Hospital day # 4  Patient seen and examined by NP/APP with MD. MD to update note as needed.   Chelsea [name] , MSN, AGACNP-BC [facility] See Amion for schedule and pager information 04/23/2023 12:52 PM   I have personally obtained history,examined this patient, reviewed notes, independently viewed imaging studies, participated in medical decision making and plan of care.ROS completed by me personally and pertinent positives fully documented  I have made any additions or clarifications directly to the above note. Agree with note above.  Patient remains intubated for concerns about protecting her airway but after discussion with family they have agreed to  one-way extubation and DNR hence will give trial of extubation when medically appropriate as per critical care team.  Discussed with family at the bedside and answered questions.  Discussed with Dr. Newton [name] patient is critically ill and at significant risk of neurological worsening, death and care requires constant monitoring of vital signs, hemodynamics,respiratory and cardiac monitoring, extensive review of multiple databases, frequent neurological assessment, discussion with family, other specialists and medical decision making of high complexity.I have made any additions or clarifications directly to the above note.This critical care time does not reflect procedure time, or teaching time or supervisory time of PA/NP/Med Resident etc but could involve care discussion time.  I spent 30 minutes of neurocritical care time  in the care of  this patient.      Delia Heady, MD Medical Director [facility] Pager: [phone] 04/23/2023 3:43 PM   To contact Stroke Continuity provider, please refer to [email or web address]. After hours, contact General Neurology

## 2023-04-23 NOTE — Inpatient Diabetes Management (Signed)
 Inpatient Diabetes Program Recommendations  AACE/ADA: New Consensus Statement on Inpatient Glycemic Control (2015)  Target Ranges:  Prepandial:   less than 140 mg/dL      Peak postprandial:   less than 180 mg/dL (1-2 hours)      Critically ill patients:  140 - 180 mg/dL   Lab Results  Component Value Date   GLUCAP 151 (H) 04/23/2023   HGBA1C 8.0 (H) 04/20/2023    Review of Glycemic Control  Diabetes history: DM2 Outpatient Diabetes medications: None Current orders for Inpatient glycemic control: Novolog 0-20 Q4H + 3 units Q4H  HgbA1C - 8% CBGs 269, 237, 276, 151  Inpatient Diabetes Program Recommendations:    Consider adding Lantus 5-6 units at bedtime, if appropriate  Will continue to follow.  Thank you. Ailene Ards, RD, LDN, CDCES Inpatient Diabetes Coordinator [phone]

## 2023-04-23 NOTE — Consult Note (Signed)
 Palliative Care Consult Note                                  Date: 04/23/2023   Patient Name: Chelsea [name]  DOB: May 23, 1950  MRN: [id number]  Age / Sex: 73 y.o., female  PCP: Champ Mungo, DO Referring Physician: Stroke, Md, MD  Reason for Consultation: Establishing goals of care  HPI/Patient Profile: 73 y.o. female  with past medical history of prior CVA (2002, residual right-sided deficits), asthma, type 2 diabetes, CKD stage IIIb, Fournier's gangrene, prior EtOH abuse, insomnia, panic attacks, HTN, and HLD.  She presented to the ED on 04/19/2023 with lethargy and possible seizure activity.  Code stroke called.  CT head negative for ICH.  TNK was administered.  Unable to obtain CTA head/neck due to poor access, therefore MRI/MRA brain was obtained and showed chronic changes but no acute intracranial abnormalities.  While awaiting admission, patient had acute neuro status change and unresponsiveness prompting stat repeat CT head which demonstrated brainstem ICH, likely secondary to TNK.  Given TXA and cryo x 2.PCCM was consulted for intubation.   CT head overnight 3/8 showed interval increase in size of the acute intra parenchymal hemorrhage in the left posterior pons; TXA repeated.  Palliative Medicine has been consulted for goals of care.    Past Medical History:  Diagnosis Date   Alcohol abuse    stopped in 1998   Alcohol withdrawal (HCC)    w/ hx of seizure.   Allergic rhinitis    Asthma    Cataracts, bilateral    Cerebellar infarct (HCC)    Chronic pain syndrome    Knee/back pain   Diabetes (HCC)    Fournier's gangrene    Required wound vac.    Hyperlipidemia    Hypertension    Insomnia    Obesity    Panic attacks    Type II diabetes mellitus (HCC)    Uterine fibroid     Subjective:   Extensive chart review has been completed prior to meeting with patient/family including labs, vital signs, imaging, progress/consult  notes, orders, medications and available advance directive documents.   I met with *** to discuss diagnosis, prognosis, GOC, EOL wishes, disposition, and options.  I introduced Palliative Medicine as specialized medical care for people living with serious illness. It focuses on providing relief from the symptoms and stress of a serious illness.   Created space and opportunity for family to express thoughts and feelings regarding current medical situation. Values and goals of care were attempted to be elicited.  Life Review: Patient has 3 children and multiple grandchildren.   Functional Status: ***  GOC Discussion: We discussed patient's current illness and what it means in the larger context of her ongoing co-morbidities. Current clinical status was reviewed. Natural disease trajectory of chronic illness was discussed.  Family is hopeful that patient can have some improvement, so that she can ultimately return home.  Discussed that if she is not improving as desired or if her condition were to deteriorate, the plan of care can always be changed to focus more on comfort.  Review of Systems  Unable to perform ROS   Objective:   Primary Diagnoses: Present on Admission:  Stroke-like symptoms   Physical Exam Vitals reviewed.  Constitutional:      General: She is not in acute distress.    Comments: Critically ill-appearing  Pulmonary:  Comments: Intubated    Palliative Assessment/Data: PPS 30%     Assessment & Plan:   SUMMARY OF RECOMMENDATIONS   Family agreeable to one-way extubation when patient is medically optimized Family is hopeful for successful extubation, medical stabilization, and recovery to the extent this is possible If patient's condition were to deteriorate, family would be agree with transition to comfort care Ongoing palliative support  Primary Decision Maker: NEXT OF KIN - daughter/Tara and son/John  Code Status/Advance Care Planning: DNR -  Limited  Symptom Management:  ***  Prognosis:  Guarded  Discharge Planning:  To Be Determined   Discussed with: ***    Thank you for allowing [place] to participate in the care of Randa Evens   Time Total: ***  Greater than 50%  of this time was spent counseling and coordinating care related to the above assessment and plan.  Signed by: Sherlean Foot, NP Palliative Medicine Team  Team Phone # [phone]  For individual providers, please see AMION

## 2023-04-23 NOTE — Progress Notes (Signed)
 OT Cancellation Note  Patient Details Name: Chelsea [name] MRN: [id number] DOB: [birth year]   Cancelled Treatment:    Reason Eval/Treat Not Completed: Active bedrest order (intubated)  [name] 04/23/2023, 8:33 AM

## 2023-04-23 NOTE — Progress Notes (Signed)
 SLP Cancellation Note  Patient Details Name: JEREMIAH TARPLEY MRN: [id number] DOB: [birth year]   Cancelled treatment:       Reason Eval/Treat Not Completed: Patient not medically ready (intubated). SLP will continue following.    Gwynneth Aliment, M.A., CF-SLP Speech Language Pathology, Acute Rehabilitation Services  Secure Chat preferred [phone]  04/23/2023, 9:34 AM

## 2023-04-23 NOTE — TOC CM/SW Note (Signed)
 Transition of Care [facility]) - Inpatient Brief Assessment   Patient Details  Name: Chelsea [name] MRN: [id number] Date of Birth: November 25, 1950  Transition of Care [facility]) CM/SW Contact:    Mearl Latin, LCSW Phone Number: 04/23/2023, 9:36 AM   Clinical Narrative: Patient admitted from home with the support of her daughter. She has had a recent rehab stay at [facility]. No current TOC needs identified at this time as patient is intubated.    Transition of Care Asessment: Insurance and Status: Insurance coverage has been reviewed Patient has primary care physician: Yes Champ Mungo) Home environment has been reviewed: Lives with family Prior level of function:: Just left rehab for prior stroke; unable to walk without assist; requires assistance for all ADLs Prior/Current Home Services: No current home services Social Drivers of Health Review: SDOH reviewed no interventions necessary Readmission risk has been reviewed: Yes Transition of care needs: no transition of care needs at this time

## 2023-04-23 NOTE — Progress Notes (Signed)
 PT Cancellation Note  Patient Details Name: Chelsea [name] MRN: [id number] DOB: 10/11/1950   Cancelled Treatment:    Reason Eval/Treat Not Completed: Patient not medically ready (intubated)  Lillia Pauls, PT, DPT Acute Rehabilitation Services Office [phone]    Norval Morton 04/23/2023, 4:19 PM

## 2023-04-24 DIAGNOSIS — I613 Nontraumatic intracerebral hemorrhage in brain stem: Secondary | ICD-10-CM | POA: Diagnosis not present

## 2023-04-24 DIAGNOSIS — N1832 Chronic kidney disease, stage 3b: Secondary | ICD-10-CM | POA: Diagnosis not present

## 2023-04-24 DIAGNOSIS — J9601 Acute respiratory failure with hypoxia: Secondary | ICD-10-CM

## 2023-04-24 DIAGNOSIS — G9340 Encephalopathy, unspecified: Secondary | ICD-10-CM

## 2023-04-24 DIAGNOSIS — J9600 Acute respiratory failure, unspecified whether with hypoxia or hypercapnia: Secondary | ICD-10-CM | POA: Diagnosis not present

## 2023-04-24 DIAGNOSIS — Z978 Presence of other specified devices: Secondary | ICD-10-CM

## 2023-04-24 DIAGNOSIS — R29810 Facial weakness: Secondary | ICD-10-CM | POA: Diagnosis not present

## 2023-04-24 DIAGNOSIS — G8194 Hemiplegia, unspecified affecting left nondominant side: Secondary | ICD-10-CM | POA: Diagnosis not present

## 2023-04-24 DIAGNOSIS — I619 Nontraumatic intracerebral hemorrhage, unspecified: Secondary | ICD-10-CM

## 2023-04-24 DIAGNOSIS — N3090 Cystitis, unspecified without hematuria: Secondary | ICD-10-CM

## 2023-04-24 LAB — CBC
HCT: 25.4 % — ABNORMAL LOW (ref 36.0–46.0)
Hemoglobin: 8.1 g/dL — ABNORMAL LOW (ref 12.0–15.0)
MCH: 28.5 pg (ref 26.0–34.0)
MCHC: 31.9 g/dL (ref 30.0–36.0)
MCV: 89.4 fL (ref 80.0–100.0)
Platelets: 245 10*3/uL (ref 150–400)
RBC: 2.84 MIL/uL — ABNORMAL LOW (ref 3.87–5.11)
RDW: 17.6 % — ABNORMAL HIGH (ref 11.5–15.5)
WBC: 17.1 10*3/uL — ABNORMAL HIGH (ref 4.0–10.5)
nRBC: 0 % (ref 0.0–0.2)

## 2023-04-24 LAB — CULTURE, BLOOD (ROUTINE X 2)
Culture: NO GROWTH
Culture: NO GROWTH
Special Requests: ADEQUATE

## 2023-04-24 LAB — BASIC METABOLIC PANEL
Anion gap: 7 (ref 5–15)
BUN: 48 mg/dL — ABNORMAL HIGH (ref 8–23)
CO2: 26 mmol/L (ref 22–32)
Calcium: 8.1 mg/dL — ABNORMAL LOW (ref 8.9–10.3)
Chloride: 111 mmol/L (ref 98–111)
Creatinine, Ser: 1.44 mg/dL — ABNORMAL HIGH (ref 0.44–1.00)
GFR, Estimated: 38 mL/min — ABNORMAL LOW (ref >60)
Glucose, Bld: 226 mg/dL — ABNORMAL HIGH (ref 70–99)
Potassium: 4.4 mmol/L (ref 3.5–5.1)
Sodium: 144 mmol/L (ref 135–145)

## 2023-04-24 LAB — GLUCOSE, CAPILLARY
Glucose-Capillary: 199 mg/dL — ABNORMAL HIGH (ref 70–99)
Glucose-Capillary: 225 mg/dL — ABNORMAL HIGH (ref 70–99)
Glucose-Capillary: 226 mg/dL — ABNORMAL HIGH (ref 70–99)
Glucose-Capillary: 234 mg/dL — ABNORMAL HIGH (ref 70–99)

## 2023-04-24 LAB — MAGNESIUM: Magnesium: 1.9 mg/dL (ref 1.7–2.4)

## 2023-04-24 MED ORDER — ACETAMINOPHEN 325 MG PO TABS: 650.0000 mg | ORAL_TABLET | Freq: Four times a day (QID) | ORAL | Status: DC | PRN

## 2023-04-24 MED ORDER — GLYCOPYRROLATE 0.2 MG/ML IJ SOLN: 0.2000 mg | INTRAMUSCULAR | Status: DC | PRN

## 2023-04-24 MED ORDER — GLYCOPYRROLATE 0.2 MG/ML IJ SOLN
INTRAMUSCULAR | Status: AC
  Administered 2023-04-24: 0.2 mg via INTRAVENOUS
  Filled 2023-04-24: qty 1

## 2023-04-24 MED ORDER — ACETAMINOPHEN 650 MG RE SUPP: 650.0000 mg | Freq: Four times a day (QID) | RECTAL | Status: DC | PRN

## 2023-04-24 MED ORDER — SODIUM CHLORIDE 0.9 % IV SOLN: INTRAVENOUS | Status: DC

## 2023-04-24 MED ORDER — ALBUTEROL SULFATE (2.5 MG/3ML) 0.083% IN NEBU: 2.5000 mg | INHALATION_SOLUTION | RESPIRATORY_TRACT | Status: DC | PRN

## 2023-04-24 MED ORDER — MORPHINE 100MG IN NS 100ML (1MG/ML) PREMIX INFUSION
0.0000 mg/h | INTRAVENOUS | Status: DC
  Administered 2023-04-24: 5 mg/h via INTRAVENOUS
  Administered 2023-04-24: 20 mg/h via INTRAVENOUS
  Filled 2023-04-24: qty 100

## 2023-04-24 MED ORDER — GLYCOPYRROLATE 1 MG PO TABS: 1.0000 mg | ORAL_TABLET | ORAL | Status: DC | PRN

## 2023-04-24 MED ORDER — POLYVINYL ALCOHOL 1.4 % OP SOLN: 1.0000 [drp] | Freq: Four times a day (QID) | OPHTHALMIC | Status: DC | PRN

## 2023-04-24 MED ORDER — MORPHINE 100MG IN NS 100ML (1MG/ML) PREMIX INFUSION
INTRAVENOUS | Status: AC
  Administered 2023-04-24: 5 mg/h via INTRAVENOUS
  Filled 2023-04-24: qty 100

## 2023-04-24 MED ORDER — ORAL CARE MOUTH RINSE: 15.0000 mL | OROMUCOSAL | Status: DC | PRN

## 2023-04-24 MED ORDER — GLYCOPYRROLATE 0.2 MG/ML IJ SOLN
0.2000 mg | INTRAMUSCULAR | Status: DC | PRN
  Administered 2023-04-24: 0.2 mg via INTRAVENOUS
  Filled 2023-04-24: qty 1

## 2023-04-24 MED ORDER — MORPHINE BOLUS VIA INFUSION: 5.0000 mg | INTRAVENOUS | Status: DC | PRN

## 2023-05-06 ENCOUNTER — Ambulatory Visit: Payer: Self-pay

## 2023-05-06 NOTE — Telephone Encounter (Signed)
 Copied from CRM [id number]. Topic: General - Deceased Patient >> May 22, 2023  1:06 PM Antony Haste wrote: Name of caller: Paulett Kaufhold (Daughter)  Date of death: 2023-05-13   Name of funeral home: [facility] - [address] [place], [place] [address]   The patient's daughter, Delice Bison states she was advised by Dr. Algis Downs to contact her mother's PCP to provide her with the location and time of her funeral. Delice Bison confirmed the funeral will be at 1:30p on 05/05/2023.

## 2023-05-14 NOTE — Procedures (Signed)
 Extubation Procedure Note  Patient Details:   Name: Chelsea [name] DOB: 01/01/1951 MRN: [id number]   Airway Documentation:    Vent end date: 04/30/2023 Vent end time: 1305   Evaluation  O2 sats: stable throughout Complications: No apparent complications Patient did tolerate procedure well. Bilateral Breath Sounds: Clear, Diminished   No  Patient extubated per order and family wishes to 4L [place] with no apparent complications. No audible cuff leak noted, however volumes lost on ventilator- CCM contacted and RT ordered to proceed with extubation. Patient has strong cough, but is unable to speak at this time. Vitals are stable. RT will continue to monitor.   Giuseppina Quinones Lajuana Ripple 04/21/2023, 1:09 PM

## 2023-05-14 NOTE — Progress Notes (Signed)
 1300-Pt was extubated with Amy, RT. Immediately following extubation pt placed on 4L via [place]. She developed soft snoring respirations at this time, opened eyes to voice but did not follow commands. Family at bedside. By 1320 had progressed to agonal breathing. Dr. Silvestre Gunner at bedside and decision to transfer to comfort care. Family has been made aware of medications available to keep [facility] comfortable. All questions answered.

## 2023-05-14 NOTE — Progress Notes (Addendum)
 STROKE TEAM PROGRESS NOTE   INTERIM HISTORY/SUBJECTIVE Patient has been hemodynamically stable but was febrile to 100.7 overnight with WBC of 15.2.  Yesterday's chest x-ray revealed no airspace disease. Family has decided to proceed with one-way extubation today. Will transition to comfort care as CCM feel she will not tolerate extubation she is not safely maintaining airway.    OBJECTIVE   CBC    Component Value Date/Time   WBC 17.1 (H) 04/17/2023 0603   RBC 2.84 (L) 04/13/2023 0603   HGB 8.1 (L) 05/13/2023 0603   HGB 11.0 (L) 09/24/2022 1107   HCT 25.4 (L) 04/21/2023 0603   HCT 35.4 09/24/2022 1107   PLT 245 04/30/2023 0603   PLT 248 09/24/2022 1107   MCV 89.4 05/12/2023 0603   MCV 86 09/24/2022 1107   MCH 28.5 04/13/2023 0603   MCHC 31.9 05/04/2023 0603   RDW 17.6 (H) 05/08/2023 0603   RDW 16.0 (H) 09/24/2022 1107   LYMPHSABS 2.5 04/19/2023 1234   LYMPHSABS 2.0 10/26/2020 1007   MONOABS 0.6 04/19/2023 1234   EOSABS 0.2 04/19/2023 1234   EOSABS 0.5 (H) 10/26/2020 1007   BASOSABS 0.1 04/19/2023 1234   BASOSABS 0.1 10/26/2020 1007    BMET    Component Value Date/Time   NA 144 04/29/2023 0603   NA 141 09/24/2022 1107   K 4.4 05/09/2023 0603   CL 111 04/25/2023 0603   CO2 26 04/18/2023 0603   GLUCOSE 226 (H) 05/12/2023 0603   BUN 48 (H) 04/23/2023 0603   BUN 52 (H) 09/24/2022 1107   CREATININE 1.44 (H) 04/26/2023 0603   CREATININE 1.05 12/25/2013 1551   CALCIUM 8.1 (L) 05/10/2023 0603   EGFR 30 (L) 09/24/2022 1107   GFRNONAA 38 (L) 04/23/2023 0603   GFRNONAA 56 (L) 12/25/2013 1551    IMAGING past 24 hours No results found.   Vitals:   05/03/2023 0700 04/16/2023 0800 04/13/2023 0818 04/28/2023 0821  BP: (!) 136/55 (!) 144/60    Pulse: 70 71 73   Resp: 17 18 19    Temp:  98.4 F (36.9 C)    TempSrc:  Axillary    SpO2: 100% 100% 100% 100%  Weight:      Height:         PHYSICAL EXAM General:  Eyes open Psych:  Mood and affect appropriate for situation CV:  Regular rate and rhythm on monitor Respiratory: Respirations synchronous with ventilator GI: Abdomen soft and nontender   NEURO:  Mental Status: Intubated, no sedation.  Drowsy but can be aroused, purposeful, always commands with all 4 extremities Speech/Language: No verbal output as she is intubated  Cranial Nerves:  II: PERRL.  III, IV, VI: Eyes disconjugate today VII: Face appears symmetric within limits of evaluation with ET tube in place VIII: hearing intact to voice. IX, X: Cough and gag present XI: Head midline  XII: tongue is midline without fasciculations. Motor: moving all extremities antigravity.  Tone: is normal and bulk is normal Sensation- Intact to light touch bilaterally. Coordination: Unable to perform Gait- deferred  Most Recent NIH 27     ASSESSMENT/PLAN  Chelsea [name] is a 73 y.o. female with history of HTN, HLD, DM, CVA 2022, CKD, right heel ulceration, recent presyncope just discharged from rehab presented to ED for code stroke due to right gaze, left facial droop, left-sided weakness    NIH on Admission 14 Currently, family is discussing goals of care and has decided to proceed with one-way extubation.  Aborted stroke  with hemorrhagic transformation in the setting of TNK  Code Stroke CT head - No acute intracranial abnormality or significant interval change. Stable atrophy and white matter disease. This likely reflects the sequela of chronic microvascular ischemia. Aspects is 10/10. MRI  No acute intracranial abnormality.  MRA   Moderate focal stenoses in the A2 segments bilaterally, right greater than left CT Head - Acute 16 mm hemorrhage in the posterior left pons. The hemorrhage creates some mass effect on the fourth ventricle. No intraventricular hemorrhage is present. CT Head- Interval increase in size of the acute intraparenchymal hemorrhage in the left posterior pons which now extends posteriorly into the fourth ventricle and measures up to  1.2 x 1.8 x 1.7 cm. LDL 47 HgbA1c 8.0 VTE prophylaxis - SCDs aspirin 81 mg daily prior to admission, now on No antithrombotic given hemorrhagic transformation Therapy recommendations:  Comfort care Disposition:  Comfort care  Hx of Stroke CVA 2019- Multiple bilateral infarcts appear embolic secondary to unknown source, suspicious for atrial fibrillation - right sided deficits Follows with [facility]   Respiratory Failure  Intubated in the ED PCCM following - VAP, WUA/SBT per protocol Significant tracheomalacia- failed SBT  Extubated 3/12- proceed with comfort measures   Hypertension Home meds: Amlodipine, carvedilol, chlorthalidone, benicar BP goal 130-150 for 24 hours and then less than 160  Hyperlipidemia Home meds:  Atorvastatin, resumed in hospital LDL 47, goal < 70  Diabetes type II Uncontrolled Home meds:  Insulin  HgbA1c 8.0, goal < 7.0 CBGs SSI  CKD Stage 3b Cr 1.42-> 1.16-> 1.35 Gentle hydration, replete electrolytes   Other Stroke Risk Factors Obesity, Body mass index is 28.69 kg/m., BMI >/= 30 associated with increased stroke risk, recommend weight loss, diet and exercise as appropriate    Hospital day # 5  Patient seen and examined by NP/APP with MD. MD to update note as needed.   Chelsea Picker, DNP, FNP-BC [facility] Pager: [phone]  I have personally obtained history,examined this patient, reviewed notes, independently viewed imaging studies, participated in medical decision making and plan of care.ROS completed by me personally and pertinent positives fully documented  I have made any additions or clarifications directly to the above note. Agree with note above.  Patient's neurological exam remains unchanged.  She has low-grade fever and elevated white count but is on antibiotics.  Patient unfortunately will not be able to protect her airway and family.  She would not want prolonged ventilatory support tracheostomy and agreed to DNR on Monday  extubation.  Patient will transition to comfort care measures and will be extubated tomorrow as per family wishes.  Discussed with Dr. Isaiah Serge critical care medicine and multiple family members at the bedside.This patient is critically ill and at significant risk of neurological worsening, death and care requires constant monitoring of vital signs, hemodynamics,respiratory and cardiac monitoring, extensive review of multiple databases, frequent neurological assessment, discussion with family, other specialists and medical decision making of high complexity.I have made any additions or clarifications directly to the above note.This critical care time does not reflect procedure time, or teaching time or supervisory time of PA/NP/Med Resident etc but could involve care discussion time.  I spent 30 minutes of neurocritical care time  in the care of  this patient.      Delia Heady, MD Medical Director [facility] Pager: [phone] 05/06/2023 4:41 PM    To contact Stroke Continuity provider, please refer to [email or web address]. After hours, contact General Neurology

## 2023-05-14 NOTE — Progress Notes (Signed)
 Palliative Medicine Progress Note   Patient Name: Chelsea [name]       Date: 04-26-23 DOB: [birth year]  Age: 73 y.o. MRN#: [id number] Attending Physician: Stroke, Md, MD Primary Care Physician: Champ Mungo, DO Admit Date: 04/19/2023    HPI/Patient Profile: 73 y.o. female  with past medical history of prior CVA (2002, residual right-sided deficits), asthma, type 2 diabetes, CKD stage IIIb, Fournier's gangrene, prior EtOH abuse, insomnia, panic attacks, HTN, and HLD.  She presented to the ED on 04/19/2023 with lethargy and possible seizure activity.  Code stroke called.  CT head negative for ICH.  TNK was administered.  Unable to obtain CTA head/neck due to poor access, therefore MRI/MRA brain was obtained and showed chronic changes but no acute intracranial abnormalities.   While awaiting admission, patient had acute neuro status change and unresponsiveness prompting stat repeat CT head which demonstrated brainstem ICH, likely secondary to TNK.  Given TXA and cryo x 2. Patient was intubated for airway protection.    CT head overnight 3/8 showed interval increase in size of the acute intraparenchymal hemorrhage in the left posterior pons; TXA repeated.   Palliative Medicine has been consulted for goals of care.    Subjective: Chart reviewed. Update received from RN. Patient was extubated at 1305, with plan not to re-intubate.  Per nursing, patient has not done well post-extubation as she is not safely maintaining her airway. After further discussion with family, she has been transitioned to comfort care.    Patient assessed at bedside. She is unresponsive to voice and light touch. She is on a morphine infusion at 15 mg/hr, with breathing still mildly labored. Discussed with nursing to increase  infusion.  Several  family members are present at bedside. Spoke with daughter/Tara. Confirmed that goals is for comfort measures only. Reviewed natural trajectory at EOL. Emotional support provided.    Objective:  Physical Exam Constitutional:      Appearance: She is ill-appearing.  Pulmonary:     Effort: Tachypnea present.     Comments: Mildly labored Neurological:     Mental Status: She is unresponsive.             Palliative Medicine Assessment & Plan   Assessment: Principal Problem:   Stroke-like symptoms Active Problems:   Encephalopathy acute   Acute alteration in mental status  Generalized weakness   Malnutrition of moderate degree   Nontraumatic intracerebral hemorrhage (HCC)   Acute respiratory failure with hypoxia (HCC)   Endotracheal tube present    Recommendations/Plan: Continue comfort measures Continue morphine infusion Appreciate spiritual care support PRN medications are available as per below for symptom management at EOL PMT will continue to support as needed  Symptom Management:  Lorazepam (ATIVAN) prn for anxiety Haloperidol (HALDOL) prn for agitation  Glycopyrrolate (ROBINUL) for excessive secretions Ondansetron (ZOFRAN) prn for nausea Polyvinyl alcohol (LIQUIFILM TEARS) prn for dry eyes Antiseptic oral rinse (BIOTENE) prn for dry mouth   Code Status: DNR - comfort   Prognosis:  Hours  Discharge Planning: Anticipated Hospital Death    Thank you for allowing the Palliative Medicine Team to assist in the care of this patient.   MDM - High   Merry Proud, NP   Please contact Palliative Medicine Team phone at [phone] for questions and concerns.  For individual providers, please see AMION.

## 2023-05-14 NOTE — Progress Notes (Signed)
 PT Cancellation Note  Patient Details Name: JAIYA MOORADIAN MRN: [id number] DOB: 05/23/1950   Cancelled Treatment:    Reason Eval/Treat Not Completed: Patient not medically ready (noted plans to transition to comfort care. Will sign off at this time. Thank you for this consult).  Lillia Pauls, PT, DPT Acute Rehabilitation Services Office [phone]    Norval Morton 04/25/2023, 1:58 PM

## 2023-05-14 NOTE — Progress Notes (Signed)
 SLP Cancellation Note  Patient Details Name: ICEL CASTLES MRN: [id number] DOB: April 08, 1950   Cancelled treatment:       Reason Eval/Treat Not Completed: Patient not medically ready. Pt planned for one-way extubation this afternoon. Will f/u as able and if within GOC once extubated.     Mahala Menghini., M.A. CCC-SLP Acute Rehabilitation Services Office [phone]  Secure chat preferred  04/29/2023, 9:28 AM

## 2023-05-14 NOTE — Progress Notes (Signed)
 Occupational Therapy Discharge Patient Details Name: Chelsea [name] MRN: [id number] DOB: 09-09-1950 Today's Date: 05/13/2023 Time:  -     Patient discharged from OT services secondary to  GOC comfort / one way extubation 1400PM .  Please see latest therapy progress note for current level of functioning and progress toward goals.    Progress and discharge plan discussed with patient and/or caregiver: Patient unable to participate in discharge planning and no caregivers available  Please reconsult if therapy becomes appropriate.   GO     [name] 04/30/2023, 9:22 AM
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
## 2023-05-14 NOTE — Progress Notes (Signed)
   05/09/2023 1426  Spiritual Encounters  Type of Visit Initial  Care provided to: Pt and family  Conversation partners present during encounter Nurse  Referral source Nurse (RN/NT/LPN)  Reason for visit Urgent spiritual support   Reason for Visit: Chaplain responding to call from RN that Pt was about to one extubated and family would like a chaplain to come pray prior to the procedure.  Time of Visit: 20 Minutes  Description of Visit: Chaplain arrived in room to find Pt in bed surrounded by various family members (Son, daughter, granddaughter and others)   Orthoptist introduced himself and greeted the family and worked to establish connection.  Family shared with chaplain who the Pt is to them and what she is like.  Chaplain provided prayer and family joined in.    After offering prayer chaplain remained briefly and then provided instructions to family on how to reach out if they desire a chaplain to return.   Plan of Care: No further spiritual care interventions planned at this time unless called by staff or family  Chaplain services remain available by Spiritual Consult or for emergent cases, paging [phone]  Chaplain Raelene Bott, [email or web address] [phone]

## 2023-05-14 NOTE — Progress Notes (Signed)
   04/15/2023 1646  Spiritual Encounters  Type of Visit Follow up  Care provided to: Pt and family  Conversation partners present during encounter Nurse  Referral source Physician  Reason for visit End-of-life   Chaplain called to return to family as Pt was extubated and soon after was placed on comfort care.  Chaplain offered the compassion of presence.  Chaplain shared with family "The 4 most important things" to speak to the Pt and other grief education.  Family appears to be skilled in comforting one another and this will be a great advantage for them as they move forward.  As Pt's breathing became more and more shallow, Chaplain excused himself to allow the family privacy in the final moments of the Pt's life.   No further interventions planned at this time unless called.  Chaplain services remain available by Spiritual Consult or for emergent cases, paging [phone]  Chaplain Raelene Bott, MDiv [email or web address] [email or web address] [phone]

## 2023-05-14 NOTE — Death Summary Note (Signed)
 DEATH SUMMARY   Patient Details  Name: Chelsea [name] MRN: [id number] DOB: Apr 30, 1950  Admission/Discharge Information   Admit Date:  2023-05-03  Date of Death: Date of Death: 05/08/2023  Time of Death: Time of Death: 05-19-2003  Length of Stay: 5  Referring Physician: Champ Mungo, DO   Reason(s) for Hospitalization  Stroke like symptoms  Diagnoses  Preliminary cause of death: Respiratory failure  Secondary Diagnoses (including complications and co-morbidities):  Principal Problem:   Stroke-like symptoms Active Problems:   Encephalopathy acute   Acute alteration in mental status   Generalized weakness   Malnutrition of moderate degree   Nontraumatic intracerebral hemorrhage (HCC)   Acute respiratory failure with hypoxia (HCC)   Endotracheal tube present   Brief Hospital Course (including significant findings, care, treatment, and services provided and events leading to death)  Chelsea [name] is a 73 y.o. year old female with a PMH of HTN, HLD, DM, CVA May 18, 2020, CKD, right heel ulceration, recent presyncope just discharged from rehab presented to ED for code stroke due to right gaze, left facial droop, left-sided weakness NIH on Admission 14. Found to have significant tracheomalacia and was transitioned to comfort care after one way extubation.   Pertinent Labs and Studies  Significant Diagnostic Studies DG CHEST PORT 1 VIEW Result Date: 04/22/2023 CLINICAL DATA:  Intubated EXAM: PORTABLE CHEST 1 VIEW COMPARISON:  04/20/2023, 05-03-23 FINDINGS: Endotracheal tube tip is about 2.5 cm superior to the carina. Right upper extremity central venous catheter tip over the SVC region. Mild cardiomegaly. Enteric tube tip below the diaphragm but incompletely assessed. No acute airspace disease, pleural effusion or pneumothorax IMPRESSION: Endotracheal tube tip about 2.5 cm superior to the carina. Interval right-sided central venous catheter with tip superimposing the SVC region. Mild cardiomegaly.  Electronically Signed   By: Jasmine Pang M.D.   On: 04/22/2023 15:36   ECHOCARDIOGRAM COMPLETE Result Date: 04/20/2023    ECHOCARDIOGRAM REPORT   Patient Name:   Chelsea [name] Date of Exam: 04/20/2023 Medical Rec #:  [id number]       Height:       65.0 in Accession #:    [id number]      Weight:       162.9 lb Date of Birth:  [birth year]       BSA:          1.813 m Patient Age:    72 years        BP:           142/63 mmHg Patient Gender: F               HR:           72 bpm. Exam Location:  Inpatient Procedure: 2D Echo (Both Spectral and Color Flow Doppler were utilized during            procedure). Indications:    Stroke  History:        Patient has prior history of Echocardiogram examinations.                 Stroke.  Sonographer:    Delcie Roch RDCS Referring Phys: [id number] Chelsea [name] IMPRESSIONS  1. Left ventricular ejection fraction, by estimation, is 60 to 65%. The left ventricle has normal function. The left ventricle has no regional wall motion abnormalities. There is mild left ventricular hypertrophy. Left ventricular diastolic parameters were normal.  2. Right ventricular systolic function is normal. The right ventricular size is normal.  3. The  mitral valve is normal in structure. No evidence of mitral valve regurgitation. No evidence of mitral stenosis.  4. The aortic valve is tricuspid. Aortic valve regurgitation is not visualized. No aortic stenosis is present.  5. The inferior vena cava is normal in size with greater than 50% respiratory variability, suggesting right atrial pressure of 3 mmHg. FINDINGS  Left Ventricle: Left ventricular ejection fraction, by estimation, is 60 to 65%. The left ventricle has normal function. The left ventricle has no regional wall motion abnormalities. Strain was performed and the global longitudinal strain is indeterminate. The left ventricular internal cavity size was normal in size. There is mild left ventricular hypertrophy. Left ventricular diastolic parameters  were normal. Right Ventricle: The right ventricular size is normal. No increase in right ventricular wall thickness. Right ventricular systolic function is normal. Left Atrium: Left atrial size was normal in size. Right Atrium: Right atrial size was normal in size. Pericardium: There is no evidence of pericardial effusion. Mitral Valve: The mitral valve is normal in structure. No evidence of mitral valve regurgitation. No evidence of mitral valve stenosis. Tricuspid Valve: The tricuspid valve is normal in structure. Tricuspid valve regurgitation is not demonstrated. No evidence of tricuspid stenosis. Aortic Valve: The aortic valve is tricuspid. Aortic valve regurgitation is not visualized. No aortic stenosis is present. Pulmonic Valve: The pulmonic valve was normal in structure. Pulmonic valve regurgitation is not visualized. No evidence of pulmonic stenosis. Aorta: The aortic root is normal in size and structure. Venous: The inferior vena cava is normal in size with greater than 50% respiratory variability, suggesting right atrial pressure of 3 mmHg. IAS/Shunts: No atrial level shunt detected by color flow Doppler. Additional Comments: 3D was performed not requiring image post processing on an independent workstation and was indeterminate.  LEFT VENTRICLE PLAX 2D LVIDd:         3.80 cm   Diastology LVIDs:         2.30 cm   LV e' medial:    6.53 cm/s LV PW:         1.10 cm   LV E/e' medial:  8.9 LV IVS:        1.20 cm   LV e' lateral:   9.90 cm/s LVOT diam:     2.20 cm   LV E/e' lateral: 5.9 LV SV:         64 LV SV Index:   35 LVOT Area:     3.80 cm  RIGHT VENTRICLE          IVC RV Basal diam:  2.80 cm  IVC diam: 1.40 cm TAPSE (M-mode): 2.0 cm LEFT ATRIUM             Index        RIGHT ATRIUM           Index LA diam:        2.90 cm 1.60 cm/m   RA Area:     15.00 cm LA Vol (A2C):   58.3 ml 32.16 ml/m  RA Volume:   38.90 ml  21.46 ml/m LA Vol (A4C):   33.1 ml 18.26 ml/m LA Biplane Vol: 46.4 ml 25.59 ml/m   AORTIC VALVE LVOT Vmax:   74.40 cm/s LVOT Vmean:  51.400 cm/s LVOT VTI:    0.169 m  AORTA Ao Root diam: 2.80 cm MITRAL VALVE MV Area (PHT): 3.42 cm    SHUNTS MV Decel Time: 222 msec    Systemic VTI:  0.17 m MV E velocity:  58.20 cm/s  Systemic Diam: 2.20 cm MV A velocity: 52.80 cm/s MV E/A ratio:  1.10 Charlton Haws MD Electronically signed by Charlton Haws MD Signature Date/Time: 04/20/2023/3:23:06 PM    Final    Portable Chest xray Result Date: 04/20/2023 CLINICAL DATA:  73 year old female intubated. Code stroke, pontine hemorrhage. EXAM: PORTABLE CHEST 1 VIEW COMPARISON:  Portable chest 04/19/2023 and earlier. FINDINGS: Portable AP semi upright view at 0621 hours. Endotracheal tube tip in good position between the clavicles and carina. Enteric tube in good position with side hole the level of the gastric body. Lung volumes and mediastinal contours appear normal. Allowing for portable technique the lungs are clear. Negative visible bowel gas. Stable visualized osseous structures. IMPRESSION: 1. Satisfactory endotracheal and enteric tube placement. 2. No acute cardiopulmonary abnormality. Electronically Signed   By: Odessa Fleming M.D.   On: 04/20/2023 06:54   CT HEAD POST STROKE FOLLOWUP/TIMED/STAT READ Result Date: 04/20/2023 CLINICAL DATA:  Code stroke.  pontine hemorrhage EXAM: CT HEAD WITHOUT CONTRAST TECHNIQUE: Contiguous axial images were obtained from the base of the skull through the vertex without intravenous contrast. RADIATION DOSE REDUCTION: This exam was performed according to the departmental dose-optimization program which includes automated exposure control, adjustment of the mA and/or kV according to patient size and/or use of iterative reconstruction technique. COMPARISON:  CT head March 7, 25. FINDINGS: Brain: Stable intraparenchymal hemorrhage in the pons with intraventricular extension. No evidence of acute large vascular territory infarct, mass lesion, midline shift or hydrocephalus. Similar patchy  white matter hypodensities, nonspecific but compatible with chronic microvascular ischemic change. Vascular: No hyperdense vessel identified. Calcific atherosclerosis. Skull: No acute fracture. Sinuses/Orbits: No acute finding. IMPRESSION: Stable intraparenchymal hemorrhage in the pons with intraventricular extension. Electronically Signed   By: Feliberto Harts M.D.   On: 04/20/2023 01:36   DG Abdomen 1 View Result Date: 04/19/2023 CLINICAL DATA:  Status post intubation and orogastric tube placement. EXAM: ABDOMEN - 1 VIEW COMPARISON:  None Available. FINDINGS: An enteric tube is seen with its distal tip overlying the expected region of the body of the stomach. The distal side hole is approximately 7.3 cm distal to the expected region of the gastroesophageal junction. The bowel gas pattern is normal. No radio-opaque calculi or other significant radiographic abnormality are seen. IMPRESSION: Enteric tube positioning, as described above. Electronically Signed   By: Aram Candela M.D.   On: 04/19/2023 20:34   DG Chest Port 1 View Result Date: 04/19/2023 CLINICAL DATA:  Status post intubation and orogastric tube placement. EXAM: PORTABLE CHEST 1 VIEW COMPARISON:  February 15, 2023 FINDINGS: An endotracheal tube is seen with its distal tip approximately 3.9 cm from the carina. An orogastric tube is in place with its distal end extending into the body of the stomach. The heart size and mediastinal contours are within normal limits. Both lungs are clear. The visualized skeletal structures are unremarkable. IMPRESSION: Endotracheal tube and orogastric tube positioning, as described above. Electronically Signed   By: Aram Candela M.D.   On: 04/19/2023 20:33   CT HEAD POST STROKE FOLLOWUP/TIMED/STAT READ Addendum Date: 04/19/2023 ADDENDUM REPORT: 04/19/2023 19:16 ADDENDUM: Findings discussed with Dr. Otelia Limes via telephone at 7:15 p.m. Electronically Signed   By: Feliberto Harts M.D.   On: 04/19/2023 19:16    Result Date: 04/19/2023 CLINICAL DATA:  Code stroke.  Neuro deficit, acute, stroke suspected EXAM: CT HEAD WITHOUT CONTRAST TECHNIQUE: Contiguous axial images were obtained from the base of the skull through the vertex without intravenous contrast. RADIATION  DOSE REDUCTION: This exam was performed according to the departmental dose-optimization program which includes automated exposure control, adjustment of the mA and/or kV according to patient size and/or use of iterative reconstruction technique. COMPARISON:  CT head April 19, 2023. FINDINGS: Brain: Interval increase in size of the acute intraparenchymal hemorrhage in the left posterior pons which now extends posteriorly into the fourth ventricle and measures up to 1.2 x 1.8 x 1.7 cm. No evidence of acute large vascular territory infarct, mass lesion, midline shift or hydrocephalus. Vascular: No hyperdense vessel. Calcific atherosclerosis. Skull: No acute fracture. Sinuses/Orbits: Clear sinuses.  No acute orbital findings. IMPRESSION: Interval increase in size of the acute intraparenchymal hemorrhage in the left posterior pons which now extends posteriorly into the fourth ventricle and measures up to 1.2 x 1.8 x 1.7 cm. Electronically Signed: By: Feliberto Harts M.D. On: 04/19/2023 19:10   MR ANGIO HEAD WO CONTRAST Result Date: 04/19/2023 CLINICAL DATA:  Stroke, follow-up. EXAM: MRA HEAD WITHOUT CONTRAST TECHNIQUE: Angiographic images of the Circle of Willis were acquired using MRA technique without intravenous contrast. COMPARISON:  CT head without contrast 04/19/2023 at 12:57 p.m. FINDINGS: Anterior circulation: Mild atherosclerotic irregularity is present in the cavernous internal carotid arteries bilaterally. The study is somewhat degraded by patient motion. The A1 and M1 segments are normal. MCA bifurcations are within normal limits. Moderate focal stenosis is present scratched at moderate stenoses are present in the A2 segments bilaterally, right  greater than left. The more distal ACA branch vessels are within normal limits. Posterior circulation: The vertebrobasilar junction and basilar artery are normal. The superior cerebellar artery scratched at the superior cerebellar arteries are patent bilaterally. PCA branch vessels are normal bilaterally. Anatomic variants: None Other: None. IMPRESSION: 1. Moderate focal stenoses in the A2 segments bilaterally, right greater than left. 2. Mild atherosclerotic irregularity in the cavernous internal carotid arteries bilaterally. 3. No other significant proximal stenosis, aneurysm, or branch vessel occlusion within the Circle of Willis. Electronically Signed   By: Marin Roberts M.D.   On: 04/19/2023 16:11   MR BRAIN WO CONTRAST Result Date: 04/19/2023 CLINICAL DATA:  Stroke follow-up. Sudden onset of loss of consciousness. Patient had rightward gaze, left facial droop and left-sided weakness. EXAM: MRI HEAD WITHOUT CONTRAST TECHNIQUE: Multiplanar, multiecho pulse sequences of the brain and surrounding structures were obtained without intravenous contrast. COMPARISON:  CT head without contrast 01/19/2024 at 12:54 p.m. FINDINGS: Brain: No acute infarct, hemorrhage, or mass lesion is present. Moderate atrophy and white matter changes are present bilaterally. Subcortical encephalomalacia the high anterior frontal lobes is present bilaterally. Deep brain nuclei are within normal limits. Susceptibility weighted images demonstrate numerous punctate foci of susceptibility throughout both cerebral hemispheres. The greatest density is in the basal ganglia and brainstem. Vascular: Flow is present in the major intracranial arteries. Skull and upper cervical spine: The craniocervical junction is normal. Upper cervical spine is within normal limits. Marrow signal is unremarkable. Sinuses/Orbits: The paranasal sinuses and mastoid air cells are clear. Bilateral lens replacements are noted. Globes and orbits are otherwise  unremarkable. IMPRESSION: 1. No acute intracranial abnormality. 2. Moderate atrophy and white matter disease likely reflects the sequela of chronic microvascular ischemia. 3. Subcortical encephalomalacia the high anterior frontal lobes bilaterally likely reflects the sequela of remote trauma. 4. Numerous punctate foci of susceptibility throughout both cerebral hemispheres. The greatest density is in the basal ganglia and brainstem. This likely reflects the sequela of chronic microvascular ischemia. Electronically Signed   By: Virl Son.D.  On: 04/19/2023 15:54   CT Head Wo Contrast Result Date: 04/19/2023 CLINICAL DATA:  Mental status change.  Status post TNK. EXAM: CT HEAD WITHOUT CONTRAST TECHNIQUE: Contiguous axial images were obtained from the base of the skull through the vertex without intravenous contrast. RADIATION DOSE REDUCTION: This exam was performed according to the departmental dose-optimization program which includes automated exposure control, adjustment of the mA and/or kV according to patient size and/or use of iterative reconstruction technique. COMPARISON:  CT head without contrast 04/19/2023 at 12:54 p.m. FINDINGS: Brain: An acute 16 mm hemorrhage is present the posterior left pons. No other acute hemorrhage is present. Moderate chronic atrophy and white matter changes are present. Subcortical encephalomalacia is present in the high anterior frontal lobes bilaterally. A remote lacunar infarct is present within the left thalamus. The hemorrhage creates some mass effect on the fourth ventricle. No intraventricular hemorrhage is present. Cerebellum is within normal limits. Midline structures are within normal limits. Vascular: Atherosclerotic calcifications are present within the cavernous internal carotid arteries bilaterally. No hyperdense vessel is present. Skull: Calvarium is intact. No focal lytic or blastic lesions are present. No significant extracranial soft tissue lesion is  present. Sinuses/Orbits: The paranasal sinuses and mastoid air cells are clear. A right lens replacement is present. The globes and orbits are otherwise within normal limits. IMPRESSION: 1. Acute 16 mm hemorrhage in the posterior left pons. 2. The hemorrhage creates some mass effect on the fourth ventricle. No intraventricular hemorrhage is present. 3. Moderate chronic atrophy and white matter disease. 4. Subcortical encephalomalacia in the high anterior frontal lobes bilaterally. 5. Remote lacunar infarct of the left thalamus. The above was relayed via text pager to Dr. Marvel Plan on 04/19/2023 at 15:45 . Electronically Signed   By: Marin Roberts M.D.   On: 04/19/2023 15:50   [place] EKG SITE RITE Result Date: 04/19/2023 If Site Rite image not attached, placement could not be confirmed due to current cardiac rhythm.  CT HEAD CODE STROKE WO CONTRAST` Result Date: 04/19/2023 CLINICAL DATA:  Code stroke. Mental status change. Unknown cause. Lethargy. Unresponsive. EXAM: CT HEAD WITHOUT CONTRAST TECHNIQUE: Contiguous axial images were obtained from the base of the skull through the vertex without intravenous contrast. RADIATION DOSE REDUCTION: This exam was performed according to the departmental dose-optimization program which includes automated exposure control, adjustment of the mA and/or kV according to patient size and/or use of iterative reconstruction technique. COMPARISON:  CT head without contrast 03/26/2023. FINDINGS: Brain: Moderate atrophy and white matter changes are stable. Remote lacunar infarcts are present in the basal ganglia. The ventricles are proportionate to the degree of atrophy. No significant extraaxial fluid collection is present. The brainstem and cerebellum are within normal limits. Midline structures are within normal limits. Vascular: Atherosclerotic calcifications are present within the cavernous internal carotid arteries bilaterally. No hyperdense vessel is present. Skull: Calvarium  is intact. No focal lytic or blastic lesions are present. No significant extracranial soft tissue lesion is present. Sinuses/Orbits: The paranasal sinuses and mastoid air cells are clear. Right lens replacement is noted. The globes and orbits are otherwise within normal limits. ASPECTS [facility] Stroke Program Early CT Score) - Ganglionic level infarction (caudate, lentiform nuclei, internal capsule, insula, M1-M3 cortex): 7/7 - Supraganglionic infarction (M4-M6 cortex): 3/3 Total score (0-10 with 10 being normal): 10/10 IMPRESSION: 1. No acute intracranial abnormality or significant interval change. 2. Stable atrophy and white matter disease. This likely reflects the sequela of chronic microvascular ischemia. 3. Aspects is 10/10. The above was relayed  via text pager to Dr. Roda Shutters on 04/19/2023 at 13:07 . Electronically Signed   By: Marin Roberts M.D.   On: 04/19/2023 13:07    Microbiology Recent Results (from the past 240 hours)  Blood Culture (routine x 2)     Status: None   Collection Time: 04/19/23  7:58 PM   Specimen: BLOOD RIGHT HAND  Result Value Ref Range Status   Specimen Description BLOOD RIGHT HAND  Final   Special Requests   Final    BOTTLES DRAWN AEROBIC AND ANAEROBIC Blood Culture adequate volume   Culture   Final    NO GROWTH 5 DAYS Performed at [facility], [address] [place], [place] [address]    Report Status 05-19-23 FINAL  Final  Blood Culture (routine x 2)     Status: None   Collection Time: 04/19/23  7:58 PM   Specimen: BLOOD LEFT HAND  Result Value Ref Range Status   Specimen Description BLOOD LEFT HAND  Final   Special Requests   Final    BOTTLES DRAWN AEROBIC ONLY Blood Culture results may not be optimal due to an inadequate volume of blood received in culture bottles   Culture   Final    NO GROWTH 5 DAYS Performed at [facility] [place] [facility], [address], [place], [place] [address]    Report Status 2023-05-19 FINAL  Final    Lab Basic Metabolic  Panel: Recent Labs  Lab 04/20/23 1024 04/20/23 2007 04/21/23 0514 04/22/23 0500 04/23/23 0558 19-May-2023 0603  NA 140  --  140 142 141 144  K 3.9  --  3.9 3.8 4.4 4.4  CL 107  --  109 108 110 111  CO2 22  --  22 23 26 26   GLUCOSE 145*  --  246* 285* 282* 226*  BUN 33*  --  33* 37* 40* 48*  CREATININE 1.42*  --  1.40* 1.16* 1.35* 1.44*  CALCIUM 8.1*  --  8.1* 8.2* 8.0* 8.1*  MG 1.2* 1.3* 1.3* 2.2 2.0 1.9  PHOS 3.4 2.9 2.2* 3.3  --   --    Liver Function Tests: No results for input(s): "AST", "ALT", "ALKPHOS", "BILITOT", "PROT", "ALBUMIN" in the last 168 hours. No results for input(s): "LIPASE", "AMYLASE" in the last 168 hours. No results for input(s): "AMMONIA" in the last 168 hours. CBC: Recent Labs  Lab 04/21/23 0514 04/21/23 1508 04/22/23 0500 04/23/23 0558 05-19-2023 0603  WBC 9.7 11.0* 12.7* 15.2* 17.1*  HGB 6.5* 7.9* 8.0* 8.1* 8.1*  HCT 20.7* 24.9* 24.7* 25.1* 25.4*  MCV 86.6 89.9 87.3 88.1 89.4  PLT 219 214 234 245 245   Cardiac Enzymes: No results for input(s): "CKTOTAL", "CKMB", "CKMBINDEX", "TROPONINI" in the last 168 hours. Sepsis Labs: Recent Labs  Lab 04/21/23 1508 04/22/23 0500 04/23/23 0558 2023-05-19 0603  WBC 11.0* 12.7* 15.2* 17.1*    Procedures/Operations   Aborted stroke with hemorrhagic transformation in the setting of TNK  Code Stroke CT head - No acute intracranial abnormality or significant interval change. Stable atrophy and white matter disease. This likely reflects the sequela of chronic microvascular ischemia. Aspects is 10/10. MRI  No acute intracranial abnormality.  MRA   Moderate focal stenoses in the A2 segments bilaterally, right greater than left CT Head - Acute 16 mm hemorrhage in the posterior left pons. The hemorrhage creates some mass effect on the fourth ventricle. No intraventricular hemorrhage is present. CT Head- Interval increase in size of the acute intraparenchymal hemorrhage in  the left posterior pons which now extends  posteriorly into the fourth ventricle and measures up to 1.2 x 1.8 x 1.7 cm.   Respiratory Failure  3/7- Intubated in the ED Significant tracheomalacia- failed SBT  Extubated 18-May-2023- proceed with comfort measures      Elmer Picker 04/27/2023, 10:02 AM

## 2023-05-14 DEATH — deceased
